# Patient Record
Sex: Male | Born: 1935 | Race: White | Hispanic: No | Marital: Married | Smoking: Former smoker
Health system: Southern US, Community
[De-identification: ages and names within clinical notes are randomized; demographics above are authoritative.]

## PROBLEM LIST (undated history)

## (undated) DIAGNOSIS — L0390 Cellulitis, unspecified: Secondary | ICD-10-CM

## (undated) DIAGNOSIS — H9190 Unspecified hearing loss, unspecified ear: Secondary | ICD-10-CM

## (undated) DIAGNOSIS — G4730 Sleep apnea, unspecified: Secondary | ICD-10-CM

## (undated) DIAGNOSIS — I639 Cerebral infarction, unspecified: Secondary | ICD-10-CM

## (undated) DIAGNOSIS — I890 Lymphedema, not elsewhere classified: Secondary | ICD-10-CM

## (undated) DIAGNOSIS — I10 Essential (primary) hypertension: Secondary | ICD-10-CM

## (undated) HISTORY — PX: BLADDER SURGERY: SHX569

## (undated) HISTORY — DX: Cerebral infarction, unspecified: I63.9

## (undated) HISTORY — PX: KNEE SURGERY: SHX244

## (undated) HISTORY — DX: Cellulitis, unspecified: L03.90

## (undated) HISTORY — DX: Sleep apnea, unspecified: G47.30

## (undated) HISTORY — DX: Unspecified hearing loss, unspecified ear: H91.90

## (undated) HISTORY — DX: Essential (primary) hypertension: I10

---

## 2006-12-05 ENCOUNTER — Ambulatory Visit: Payer: Self-pay | Admitting: Otolaryngology

## 2006-12-05 ENCOUNTER — Other Ambulatory Visit: Payer: Self-pay

## 2006-12-15 ENCOUNTER — Ambulatory Visit: Payer: Self-pay | Admitting: Otolaryngology

## 2007-01-03 ENCOUNTER — Ambulatory Visit: Payer: Self-pay | Admitting: Unknown Physician Specialty

## 2009-02-03 ENCOUNTER — Ambulatory Visit: Payer: Self-pay | Admitting: Urology

## 2009-02-10 ENCOUNTER — Ambulatory Visit: Payer: Self-pay | Admitting: Urology

## 2009-02-19 ENCOUNTER — Ambulatory Visit: Payer: Self-pay | Admitting: Urology

## 2009-03-08 ENCOUNTER — Inpatient Hospital Stay: Payer: Self-pay | Admitting: Internal Medicine

## 2010-10-02 IMAGING — CT CT ABDOMEN AND PELVIS WITHOUT AND WITH CONTRAST
2 of 4 series · 14 of 32 positions shown, 19 images · non-contrast
Comparison: none

REASON FOR EXAM: hematuria
COMMENTS:

[Series 2: wo · axial · 0.84mm/px · z∈[+132,+482]mm · 8 of 90 slices shown, 13 images]
[im 10/90  soft-tissue]
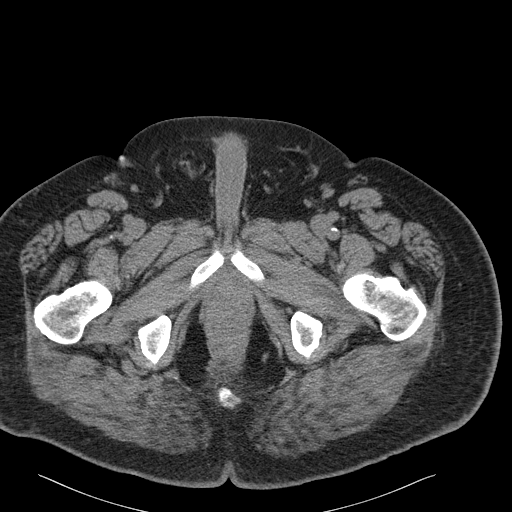
[im 10/90  bone]
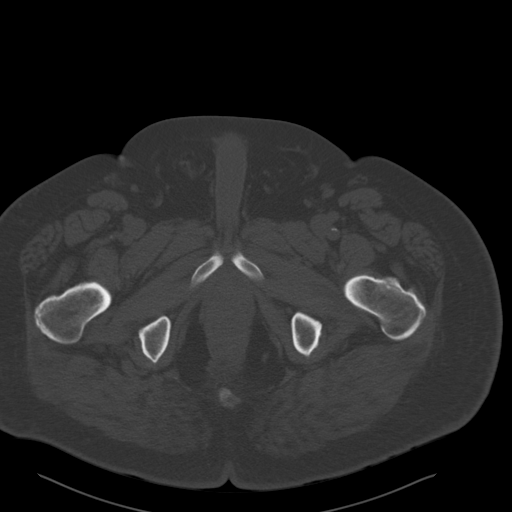
[im 20/90  soft-tissue]
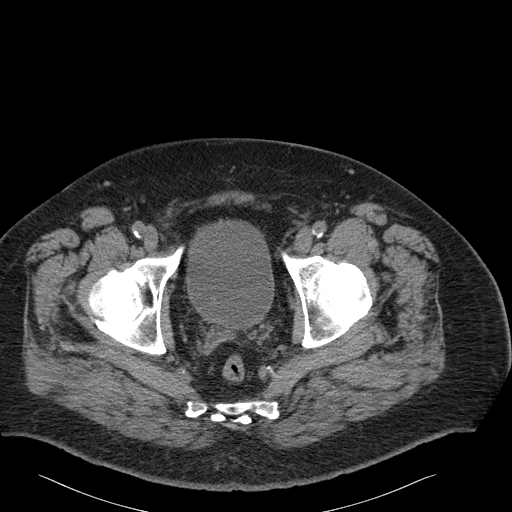
[im 30/90  soft-tissue]
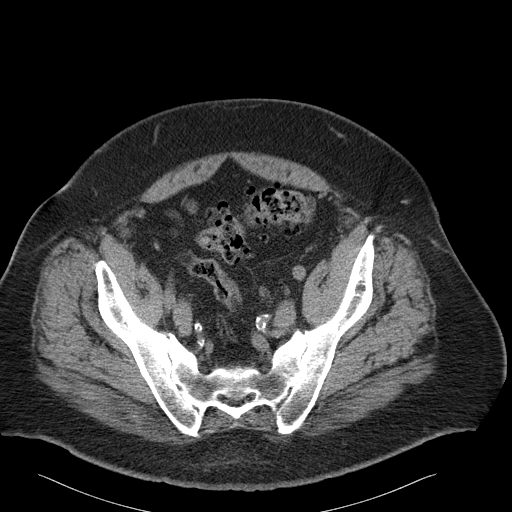
[im 40/90  soft-tissue]
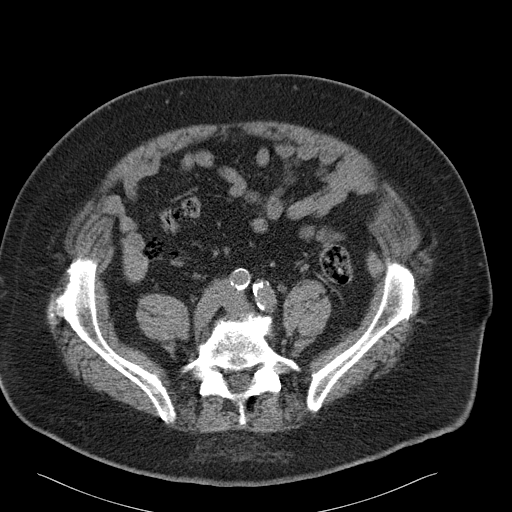
[im 50/90  soft-tissue]
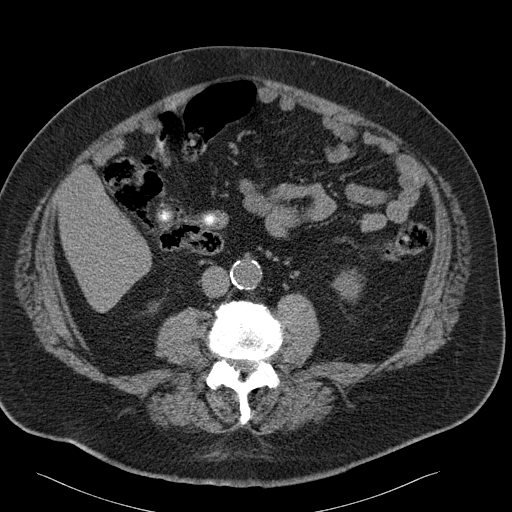
[im 50/90  lung]
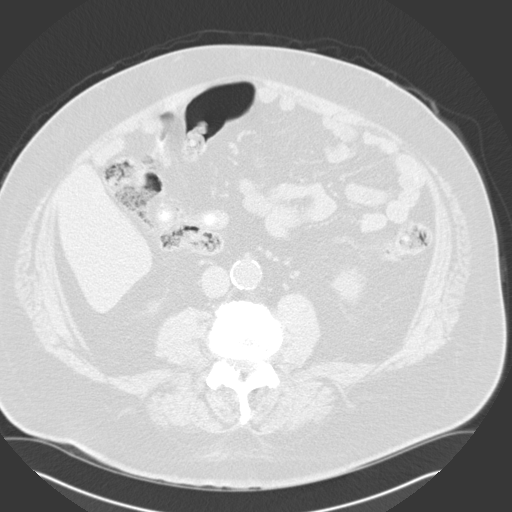
[im 60/90  soft-tissue]
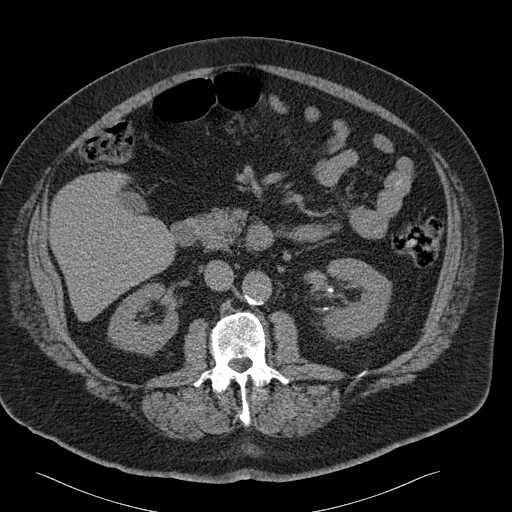
[im 60/90  lung]
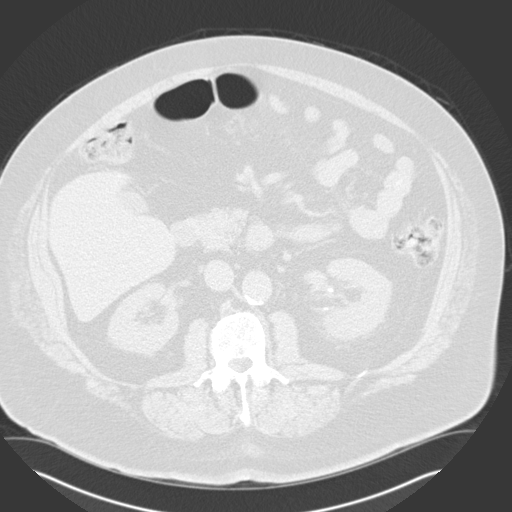
[im 70/90  soft-tissue]
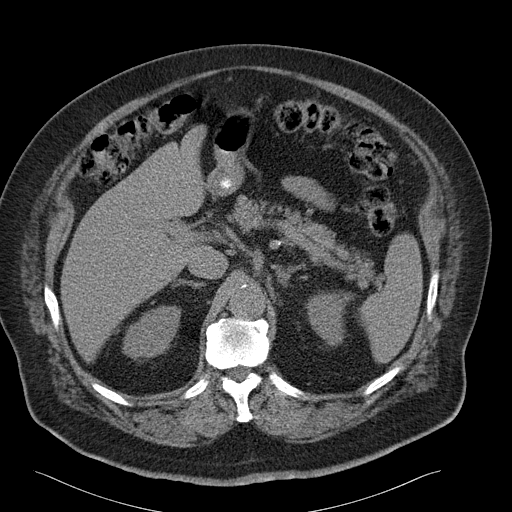
[im 70/90  lung]
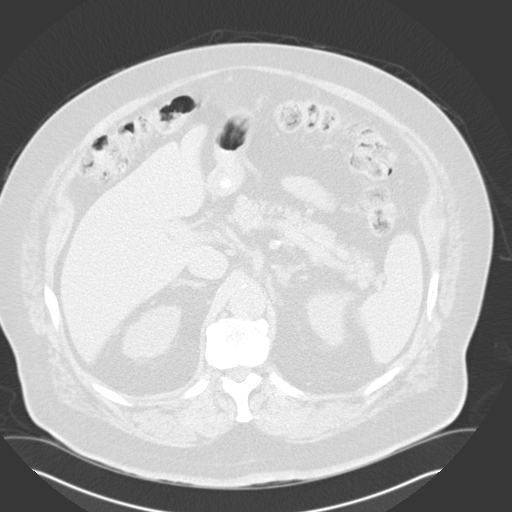
[im 80/90  soft-tissue]
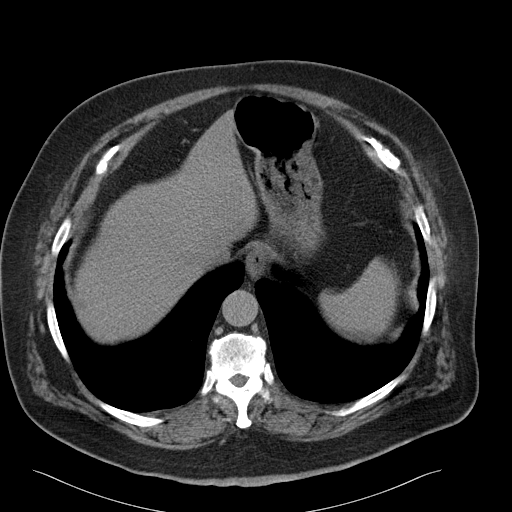
[im 80/90  lung]
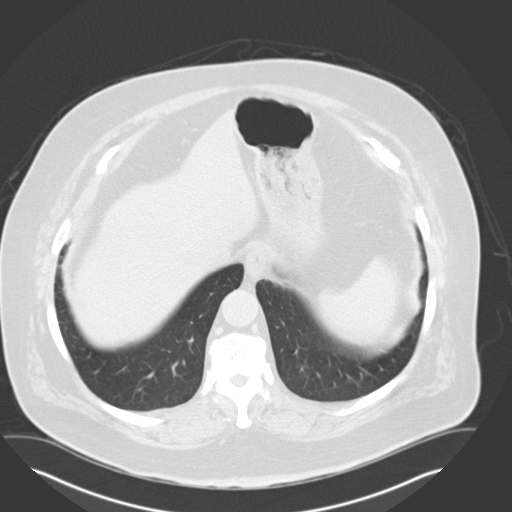

[Series 3: with · axial · 0.84mm/px · z∈[+132,+382]mm · 6 of 90 slices shown]
[im 10/90  soft-tissue]
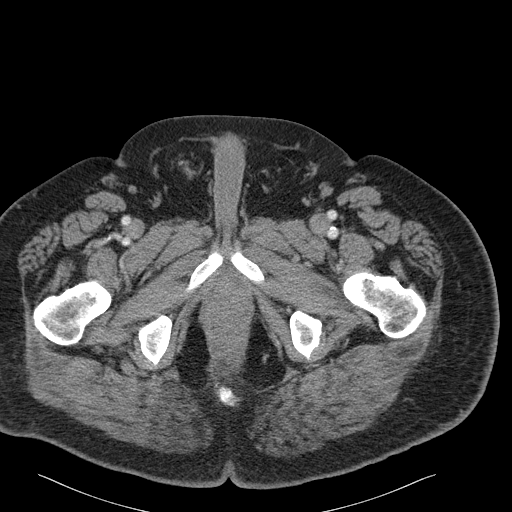
[im 20/90  soft-tissue]
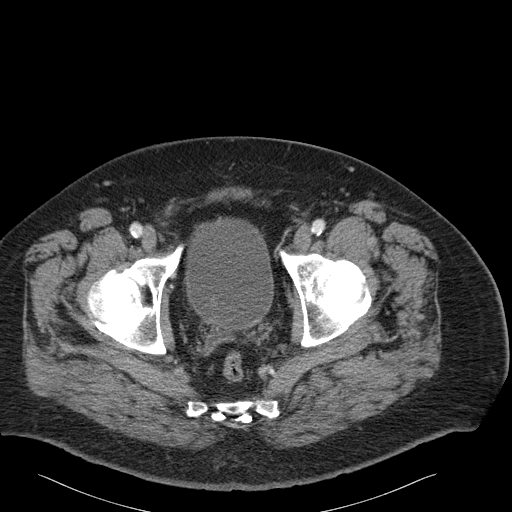
[im 30/90  soft-tissue]
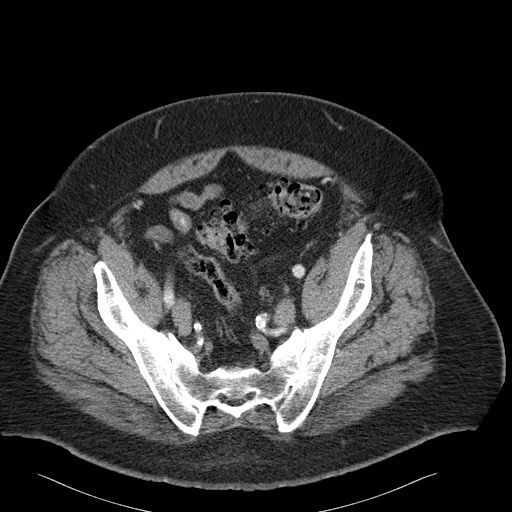
[im 40/90  soft-tissue]
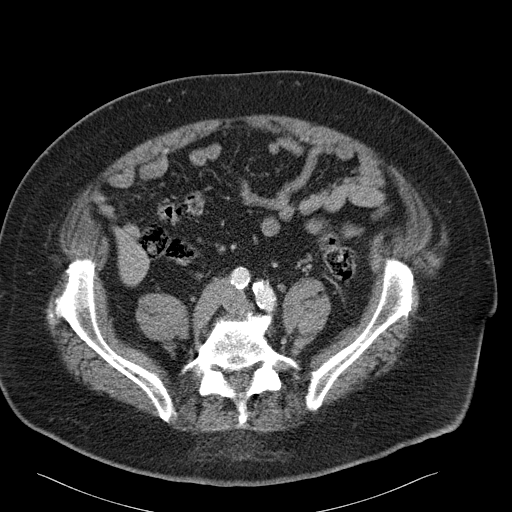
[im 50/90  soft-tissue]
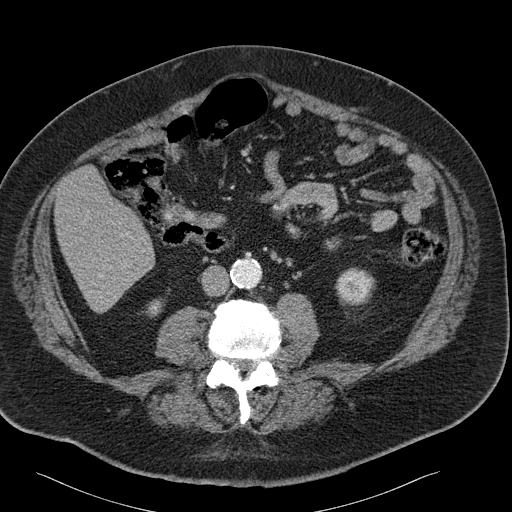
[im 60/90  soft-tissue]
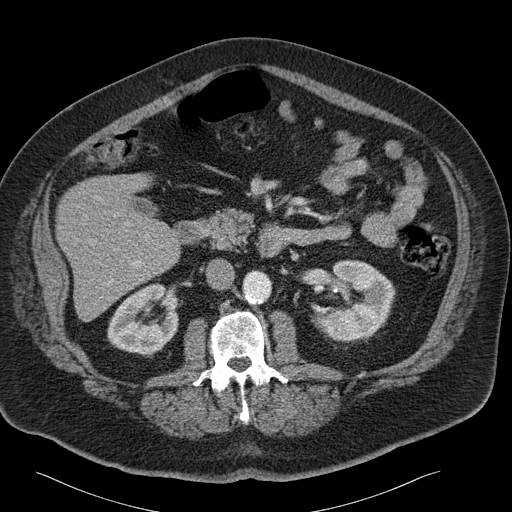

[14 of 32 positions shown; findings below may reference images not displayed]

PROCEDURE:     CT  - CT ABDOMEN / PELVIS  W/WO  - February 03, 2009  [DATE]

RESULT:     CT of the abdomen and pelvis is performed utilizing 100 ml of
Nsovue-LDC iodinated intravenous contrast. Images are reconstructed in the
axial plane at 5 mm slice thickness. A triphasic technique is utilized. The
precontrast images demonstrate no definite urinary obstruction. There
appears to be atherosclerotic calcification. A small stone in the lower pole
of the left kidney appears to be present on image #35 although
atherosclerotic calcification could give a similar appearance.  Neither
kidney shows obstruction. Following contrast administration there is
enhancement of the renal parenchyma bilaterally. The left kidney appears to
show a slightly thicker renal parenchymal pattern than the right.
Atherosclerotic changes are noted in the aorta and its branches. The liver,
spleen, pancreas, adrenal glands and visualized bowel appear unremarkable.
The urinary bladder fills with contrast opacified urine. There is no
significant aneurysm in the aorta. There is no free fluid or free air. There
is no abnormal fluid collection. Scattered colonic diverticula are seen. The
prostate appears to be slightly enlarged and unremarkable. The urinary
bladder is deformed along the base by the prostate. The gallbladder is
nondistended.
IMPRESSION: 1.     No definite renal mass. There is atherosclerotic calcification. There
may be a small stone in the lower pole to midpole region of the left kidney.
The prostate is mildly enlarged. There is scattered colonic diverticulosis.
There is minimal atelectasis or fibrosis at the lung bases.

## 2010-11-04 IMAGING — US US EXTREM LOW VENOUS BILAT
1 series · 17 of 24 positions shown · non-contrast
Comparison: none

REASON FOR EXAM: bilateral leg swelling, cancer, right>left
COMMENTS:

[Series 1: us extrem low venous bilat · 17 of 45 slices shown]
[im 1/45]
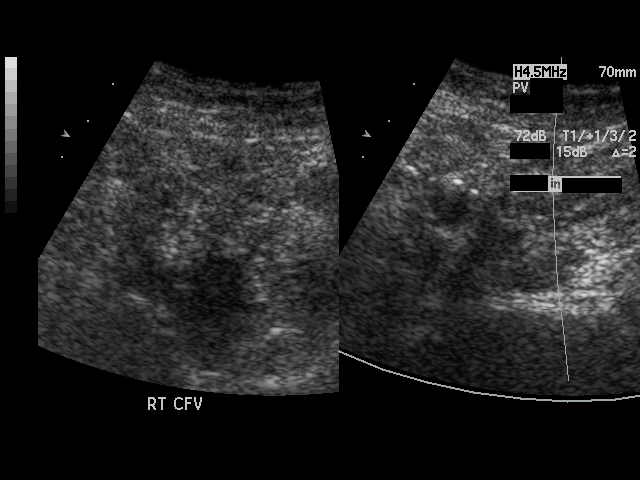
[im 4/45]
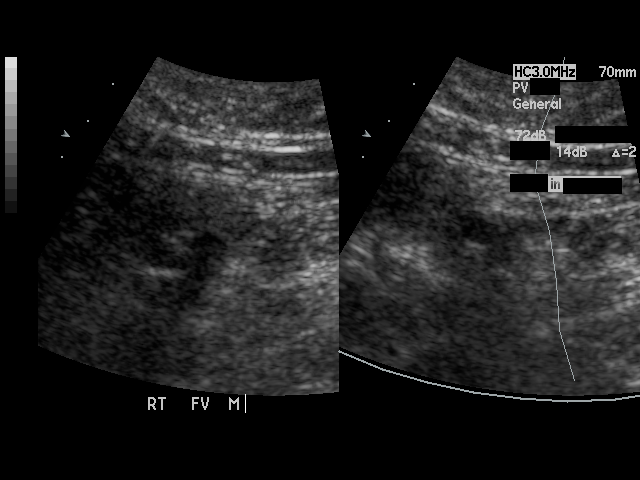
[im 6/45]
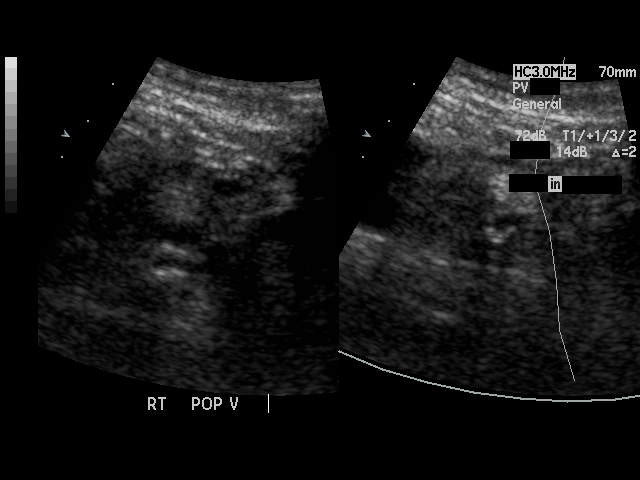
[im 8/45]
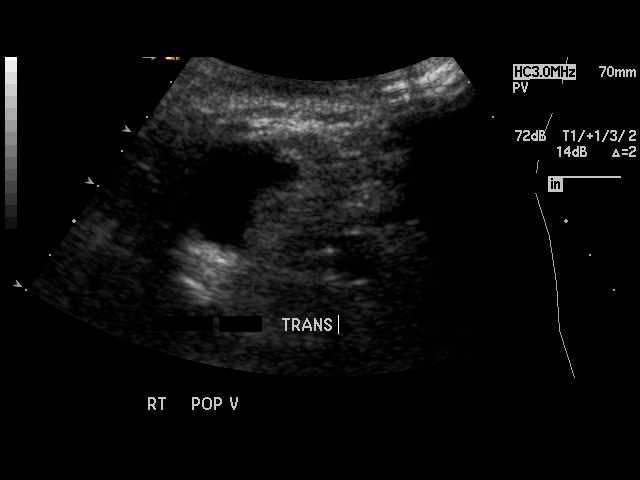
[im 12/45]
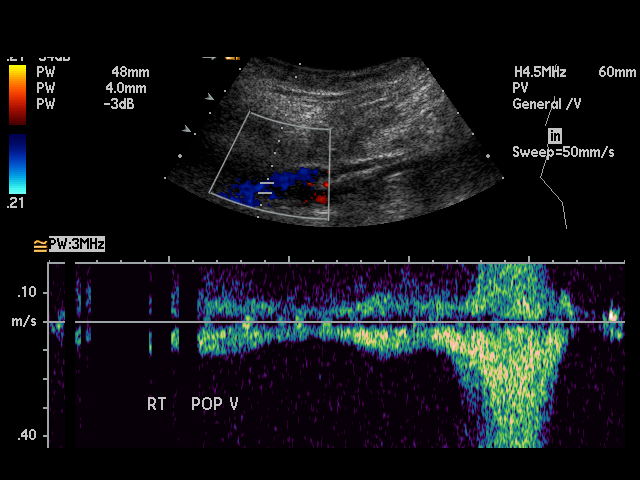
[im 14/45]
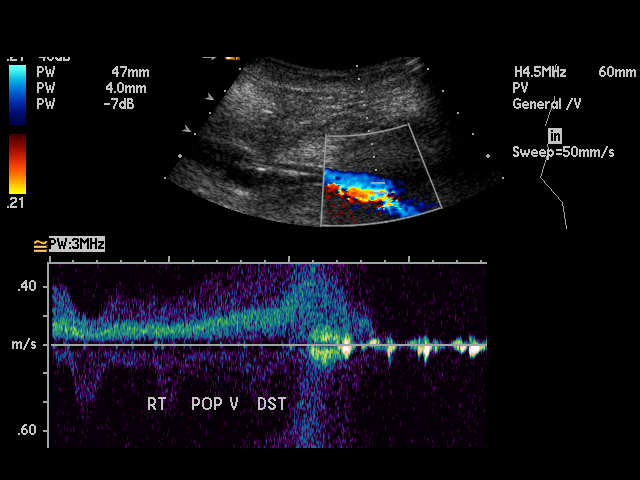
[im 18/45]
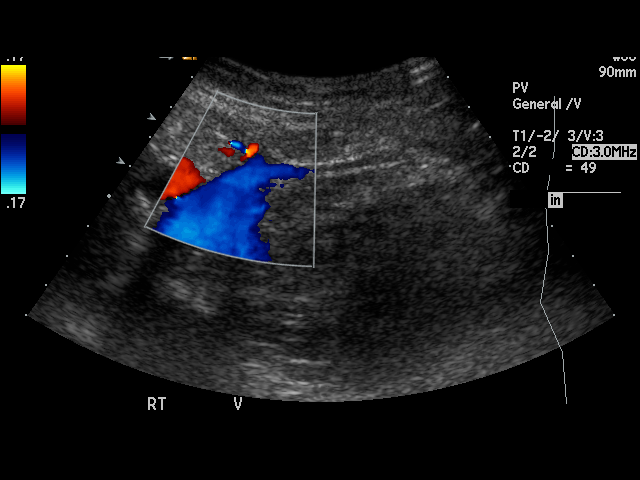
[im 20/45]
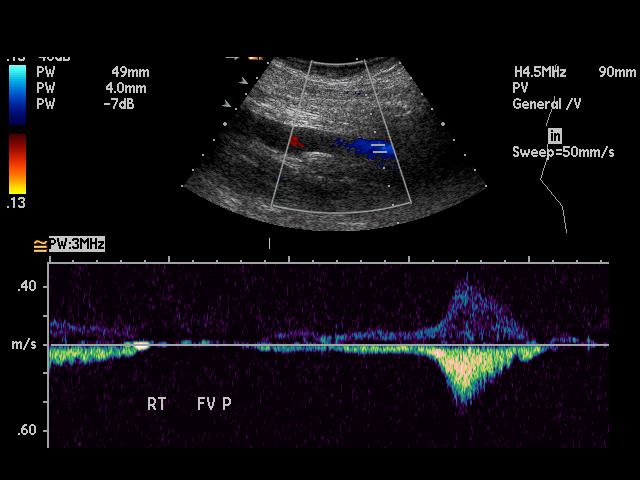
[im 23/45]
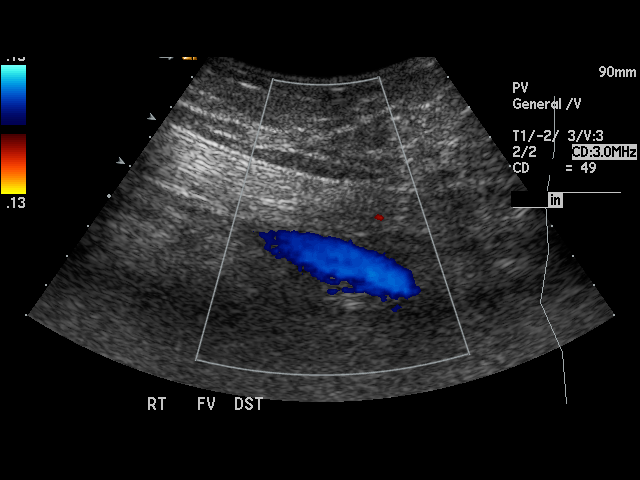
[im 25/45]
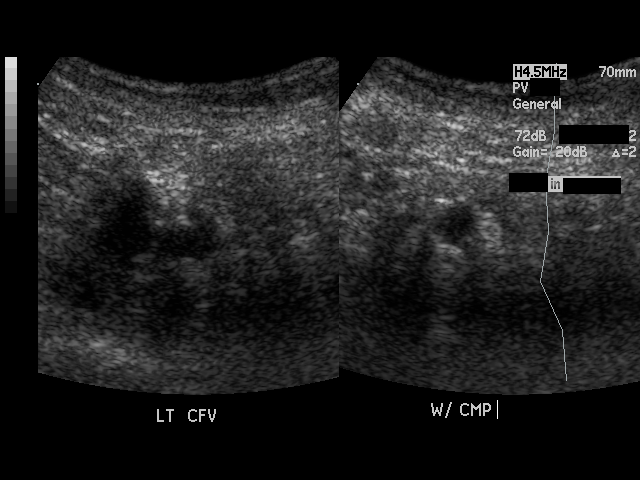
[im 27/45]
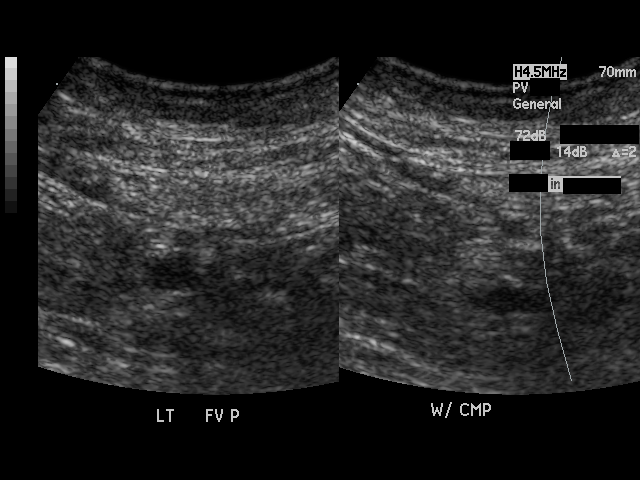
[im 31/45]
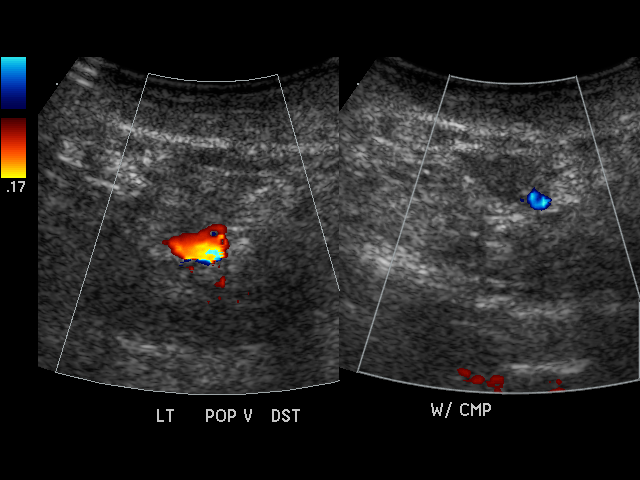
[im 33/45]
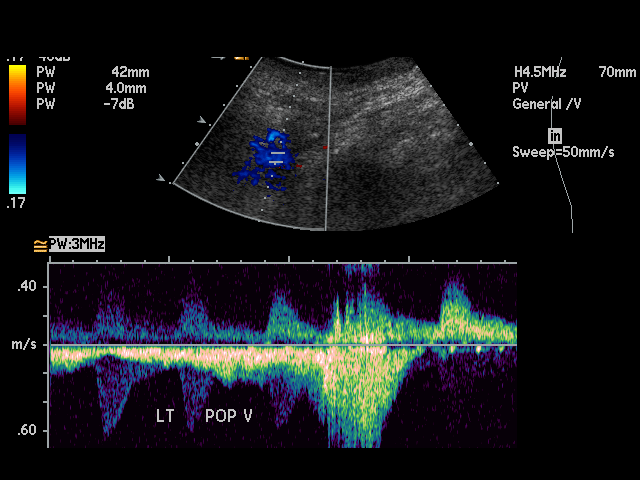
[im 37/45]
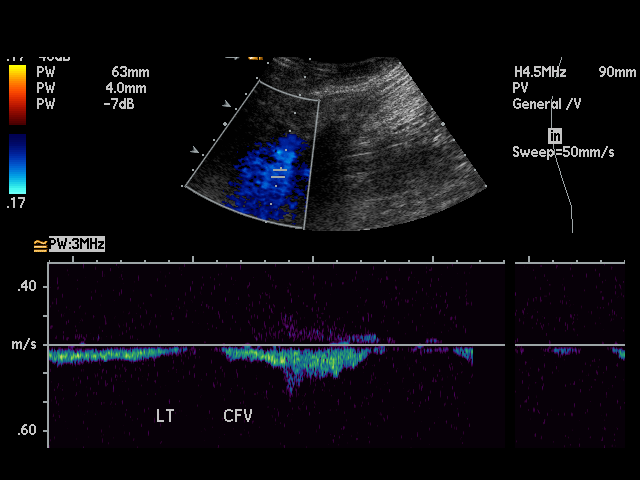
[im 39/45]
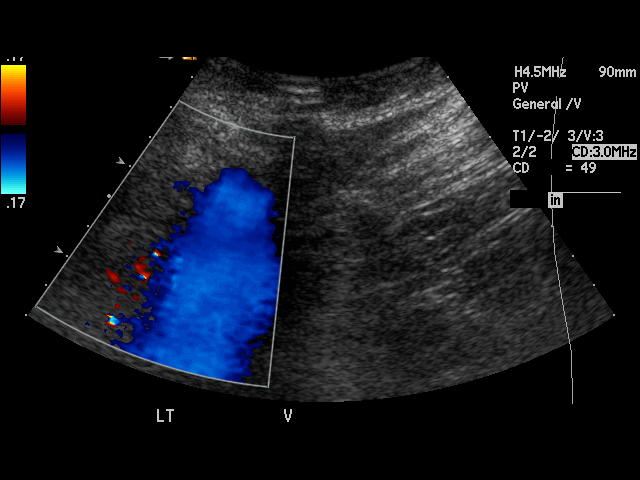
[im 41/45]
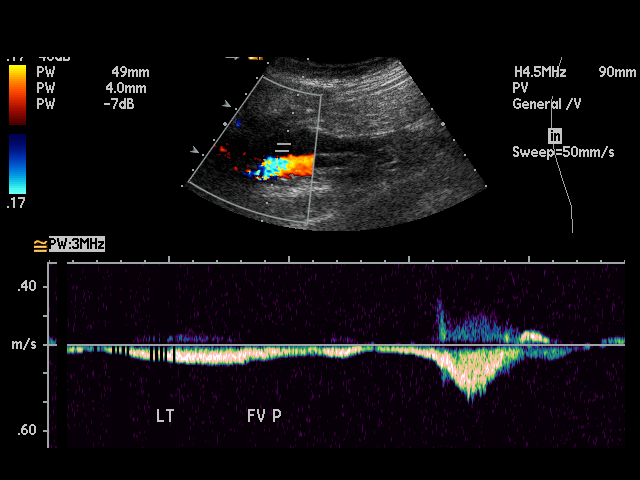
[im 45/45]
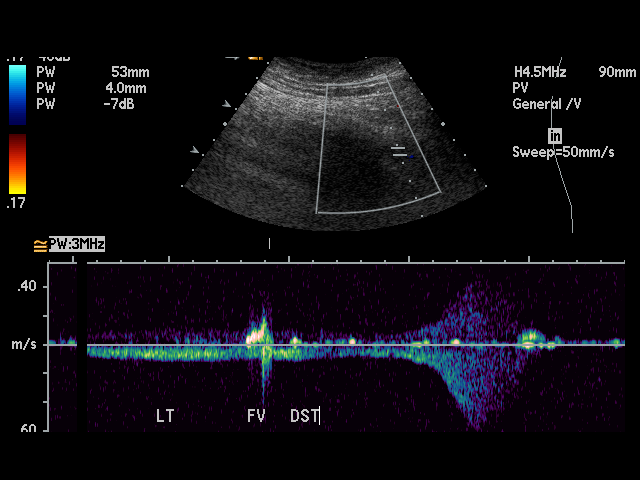

[17 of 24 positions shown; findings below may reference images not displayed]

PROCEDURE:     US  - US DOPPLER LOW EXTR BILATERAL  - March 08, 2009  [DATE]

RESULT:     Duplex Doppler interrogation of the deep venous system of both
legs from the inguinal to the popliteal region demonstrates the deep venous
systems are fully compressible throughout. The color Doppler and spectral
Doppler appearance is normal. There is normal response to distal
augmentation. The color Doppler images show no filling defect. There is a
fluid collection in the popliteal fossa of the right leg measuring 4.4 x
x 2.5 cm consistent with a Baker's cyst.
IMPRESSION: 1. No evidence of DVT in either lower extremity.
2. Right popliteal fossa cyst.

## 2011-01-18 ENCOUNTER — Ambulatory Visit: Payer: Self-pay | Admitting: General Practice

## 2011-02-01 ENCOUNTER — Inpatient Hospital Stay: Payer: Self-pay | Admitting: General Practice

## 2011-02-04 ENCOUNTER — Encounter: Payer: Self-pay | Admitting: Internal Medicine

## 2011-03-04 ENCOUNTER — Encounter: Payer: Self-pay | Admitting: Internal Medicine

## 2011-04-03 ENCOUNTER — Encounter: Payer: Self-pay | Admitting: Internal Medicine

## 2012-09-29 IMAGING — CR DG KNEE 1-2V*R*
1 series · 2 of 2 positions shown · non-contrast
Comparison: none

REASON FOR EXAM: postop
COMMENTS:   Bedside (portable):Y

[Series 1: view not recorded · 0.17mm/px · 2 of 2 slices shown]
[im 1/2]
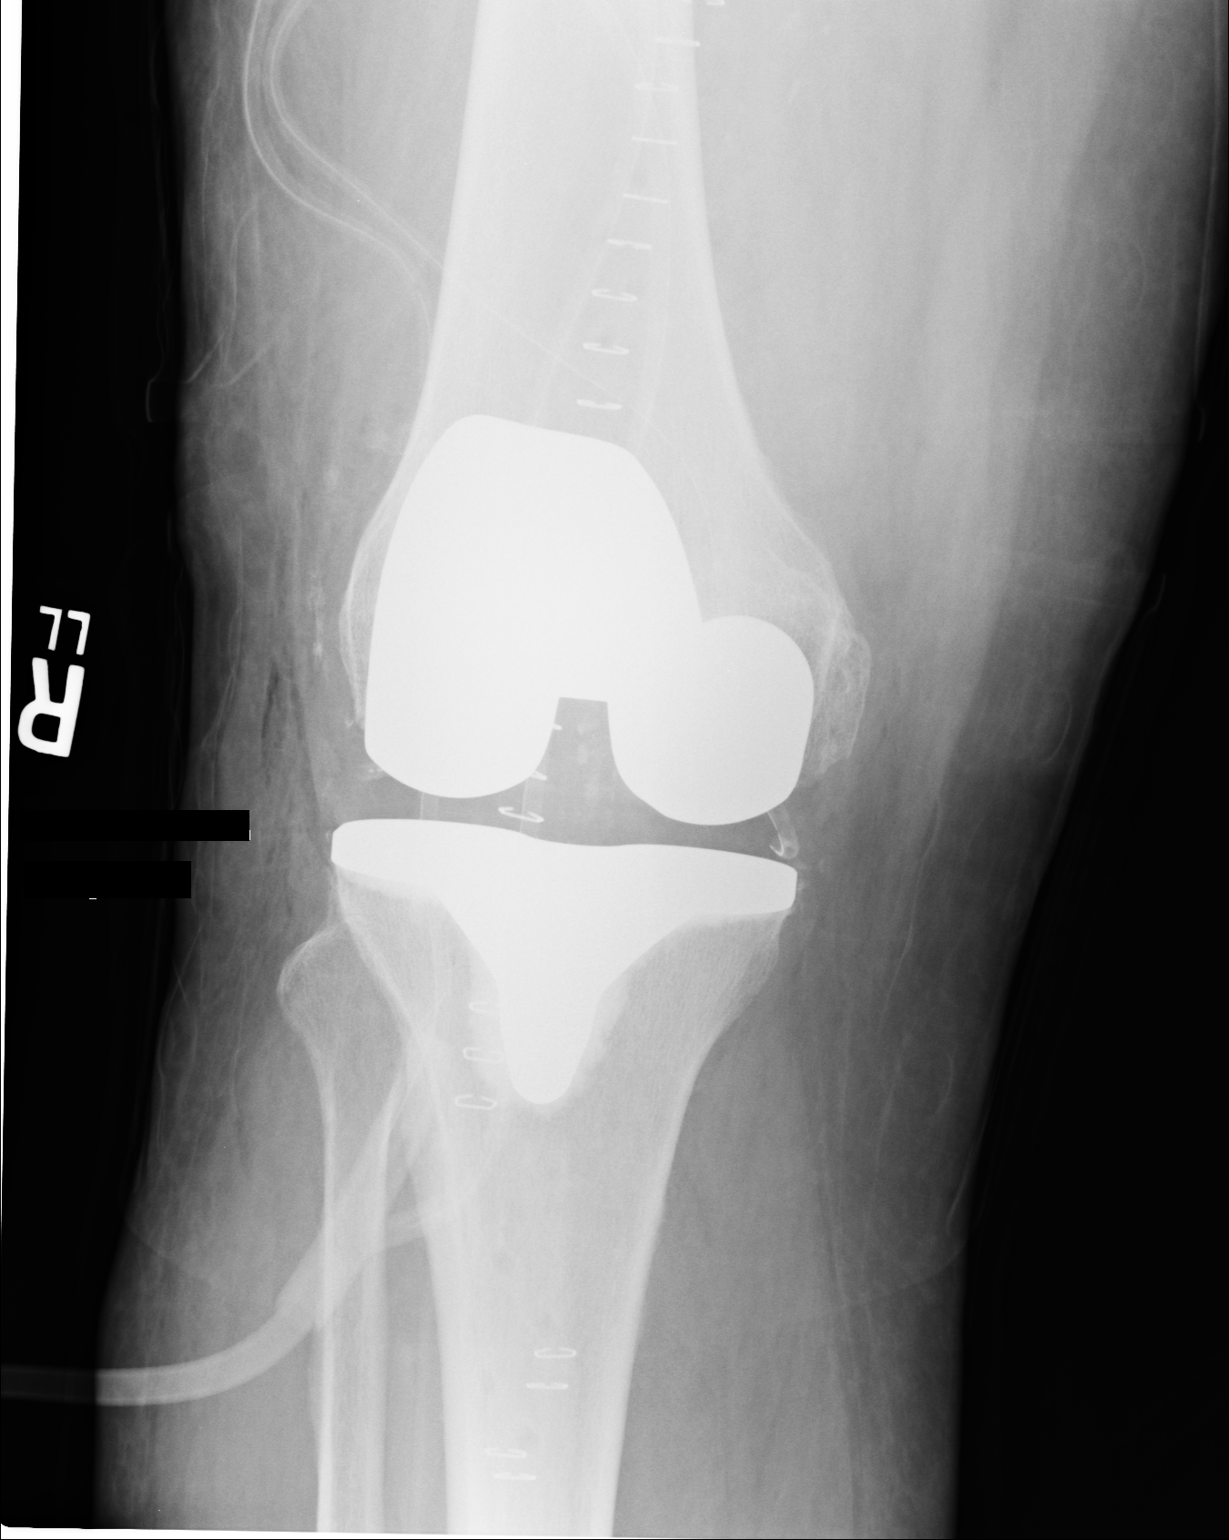
[im 2/2]
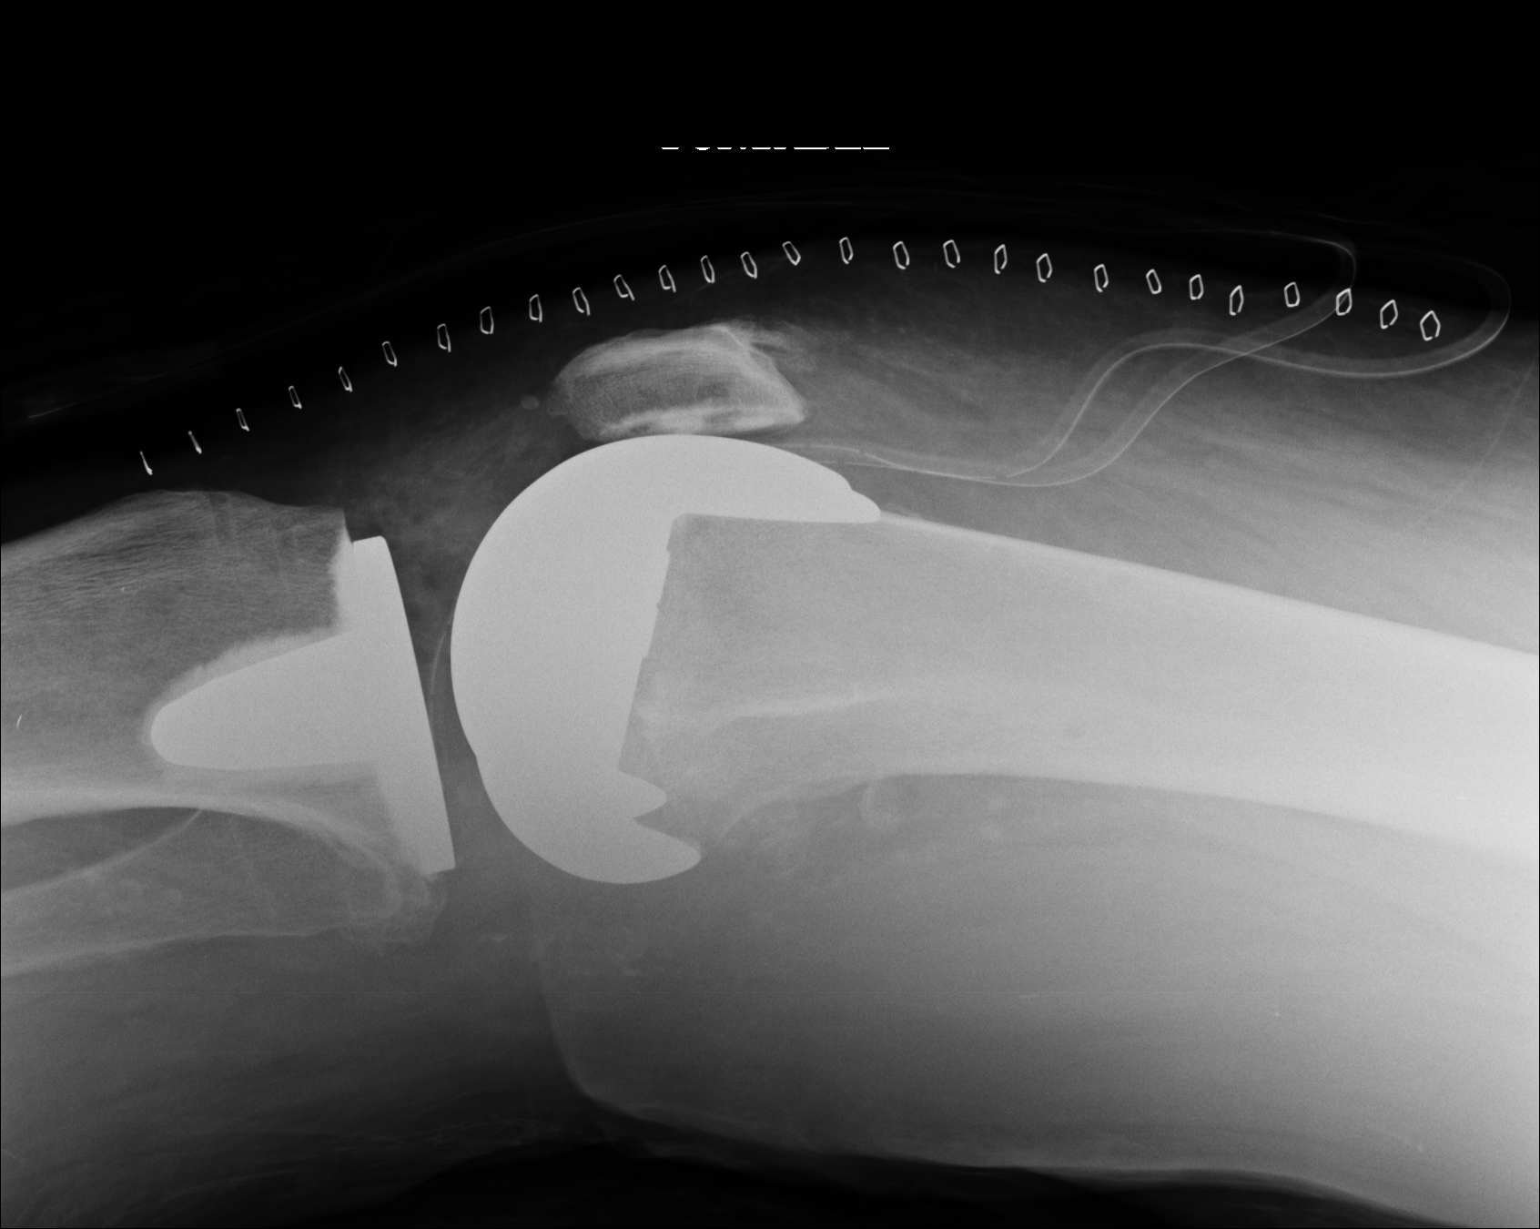

[2 of 2 positions shown; findings below may reference images not displayed]

PROCEDURE:     DXR - DXR KNEE RIGHT AP AND LATERAL  - February 01, 2011  [DATE]

RESULT:     The patient is status post right knee replacement. There is
noted a mildly mottled appearance to the medial margin of the medial femoral
condyle. The findings appear to be chronic. No fracture is seen. There is no
dislocation at the prosthetic knee joint. Postsurgical drains are visualized.
IMPRESSION: 1.     The patient is status post right knee replacement. No abnormal
postoperative changes are identified.

## 2013-02-07 ENCOUNTER — Ambulatory Visit: Payer: Self-pay | Admitting: Podiatry

## 2013-02-20 DIAGNOSIS — Z8551 Personal history of malignant neoplasm of bladder: Secondary | ICD-10-CM | POA: Insufficient documentation

## 2013-02-20 DIAGNOSIS — R350 Frequency of micturition: Secondary | ICD-10-CM | POA: Insufficient documentation

## 2013-02-20 DIAGNOSIS — R3129 Other microscopic hematuria: Secondary | ICD-10-CM | POA: Insufficient documentation

## 2013-02-20 DIAGNOSIS — C679 Malignant neoplasm of bladder, unspecified: Secondary | ICD-10-CM | POA: Insufficient documentation

## 2013-02-28 ENCOUNTER — Encounter: Payer: Self-pay | Admitting: Podiatry

## 2013-02-28 ENCOUNTER — Ambulatory Visit (INDEPENDENT_AMBULATORY_CARE_PROVIDER_SITE_OTHER): Payer: Medicare Other | Admitting: Podiatry

## 2013-02-28 VITALS — BP 115/62 | HR 102 | Resp 20 | Ht 71.0 in | Wt 296.0 lb

## 2013-02-28 DIAGNOSIS — M79609 Pain in unspecified limb: Secondary | ICD-10-CM

## 2013-02-28 DIAGNOSIS — B351 Tinea unguium: Secondary | ICD-10-CM

## 2013-02-28 NOTE — Progress Notes (Signed)
Steven Meza presents today for chief complaint of painful toenails bilateral foot.  Objective: Pulses remain palpable bilateral lower extremity. Nails are thick yellow dystrophic clinically mycotic. He does have some verrucoid changes to the dorsal aspect of the left foot. But with his associated edema I would hesitate to treat these.  Assessment: Pain in limb secondary to onychomycosis bilateral foot.  Plan: Debridement of nails 1 through 5 bilateral covered service secondary to pain. Followup with him in 3 months.

## 2013-05-30 ENCOUNTER — Ambulatory Visit: Payer: Medicare Other | Admitting: Podiatry

## 2018-02-13 ENCOUNTER — Ambulatory Visit: Payer: Medicare Other | Attending: Internal Medicine | Admitting: Occupational Therapy

## 2018-02-13 ENCOUNTER — Other Ambulatory Visit: Payer: Self-pay

## 2018-02-13 ENCOUNTER — Encounter: Payer: Self-pay | Admitting: Occupational Therapy

## 2018-02-13 DIAGNOSIS — I890 Lymphedema, not elsewhere classified: Secondary | ICD-10-CM | POA: Diagnosis present

## 2018-02-13 NOTE — Patient Instructions (Signed)

## 2018-02-14 ENCOUNTER — Ambulatory Visit: Payer: Medicare Other | Admitting: Occupational Therapy

## 2018-02-14 DIAGNOSIS — I890 Lymphedema, not elsewhere classified: Secondary | ICD-10-CM

## 2018-02-14 NOTE — Therapy (Signed)
[place] [facility] SERVICES [address] [place], [place], [address] Phone: [phone]   Fax:  [phone]  Occupational Therapy Treatment  Patient Details  Name: Steven Meza. MRN: [id number] Date of Birth: [birth year] Referring Provider (OT): Felipa Furnace, MD   Encounter Date: 02/14/2018  OT End of Session - 02/14/18 1639    Visit Number  2    Number of Visits  36    Date for OT Re-Evaluation  05/14/18    OT Start Time  0300    OT Stop Time  0418    OT Time Calculation (min)  78 min    Activity Tolerance  Patient tolerated treatment well;No increased pain    Behavior During Therapy  WFL for tasks assessed/performed       Past Medical History:  Diagnosis Date  . Cellulitis   . HBP (high blood pressure)   . Hearing loss   . Sleep apnea   . Stroke [facility])     Past Surgical History:  Procedure Laterality Date  . BLADDER SURGERY    . KNEE SURGERY Right     There were no vitals filed for this visit.  Subjective Assessment - 02/14/18 1632    Subjective   Steven Meza arrives for OT rx visit 2 to address BLE lymphedema. Pt is accompanied by his wife, [name]. Pt reports that he read Lymphedema Workbook during visit interval. We spent 1/3 of our session today discussing questions re LE etiology, diet considerations, exercise questions and CDT.    Patient is accompained by:  Family member    Pertinent History  Conditions contributing to BLE lymphedema include morbid obesity, (BMI 42.13 kg/m2) OA, OSA, (declines CPap) HTN, venous stasis and recurrent cellulitis    Limitations  difficulty walking, impaired transfers and functional mobility, impaired sleep, HOH (declines hearing aids) , chronic leg swelling and associated pain, impaired LB dressing and bathing, decreased AROM at feet, ankles, knees 2/2 body habitus and girth, impaired standing and walking balance with 3 falls in last 3 months,      Patient Stated Goals  stop he  lymphorrhea, get the swelling down and keep it from getting worse.    Currently in Pain?  Yes   unchanged since initial eval.   Pain Location  Leg    Pain Orientation  Right;Left    Pain Descriptors / Indicators  Heaviness;Tender;Discomfort;Tightness;Sore;Pressure;Tiring;Stabbing;Burning    Pain Type  Chronic pain    Pain Onset  --   > 2 years                  OT Treatments/Exercises (OP) - 02/14/18 0001      ADLs   ADL Education Given  Yes      Manual Therapy   Manual Therapy  Edema management;Compression Bandaging    Compression Bandaging  4 layer gradietmn compression wrtap to LLE below the knee. Applied 0.4 cm Rosidal foam over coton [place] w/ equal overlaps. Aplied 8 cm short stretch wrap to foot and ankle using gradient technique. Omitted toe wraps today. Applied 10 and 12 cm ss wraps circumferentially from ankle to politeal ending with single layer SS at top edge. Stretch net applied over tape.              OT Education - 02/14/18 1638    Education Details  Pt and CG edu provided throughout session for gradient compression wrapping, CDT regimes, and lymphatic pumping ther ex.    Person(s) Educated  Patient;Spouse    Methods  Explanation;Demonstration;Handout;Tactile cues;Verbal cues    Comprehension  Verbalized understanding;Returned demonstration;Tactile cues required;Need further instruction;Verbal cues required          OT Long Term Goals - 02/14/18 1355      OT LONG TERM GOAL #1   Title  Pt will demonstrate understanding of lymphedema (LE) precautions / prevention principals, including signs / symptoms of cellulitis infection with modified independence using LE Workbook as printed reference to identify 6 precautions without verbal cues by end of 6th  OT Rx visit.     Baseline  Max A    Time  6    Period  Weeks    Status  New      OT LONG TERM GOAL #2   Title  Pt will be able to apply multi-layer short stretch compression wraps using correct  gradient techniques with Max CG assistance to achieve optimal limb volume reduction during Intensive Phase CDT, and to return affected limb(s) , as closely as possible, to premorbid size and shape.    Baseline  Dependent    Time  6    Status  New      OT LONG TERM GOAL #3   Title  Pt to achieve no less than 10% limb volume reduction in affected limb(s) during Intensive Phase CDT to control limb swelling, to improve tissue integrity and immune function, to improve ADLs performance and to improve functional mobility/ transfers.    Baseline  Max A    Time  12    Period  Weeks    Status  New      OT LONG TERM GOAL #4   Title  With moderate  CG assistance Pt will achieve 100% compliance with daily LE self-care home program components, including proper skin care, simple self-MLD, gradient compression wraps/ garments, and therapeutic exercise to ensure optimal Intensive Phase limb volume reduction to expedite compression garment/ device fitting.    Baseline  Max A    Time  12    Period  Weeks    Status  New      OT LONG TERM GOAL #5   Title  Pt will demonstrate competent use of assistive devices during LE self-care training to improve ability to lift legs onto treatment table, to don and doff shoes and socks, and to don and doff compression garments/devices with Max  assistance to ensure optimal LE self-management over time.    Baseline  Dependent    Time  12    Period  Weeks    Status  New      OT LONG TERM GOAL #6   Title  Pt will retain optimal limb volume reductions achieved during Intensive Phase CDT with no more than 3% volume increase with ongoing CG assistance to limit LE progression and further functional decline.    Baseline  Dependent    Time  6    Period  Months    Status  New            Plan - 02/14/18 1642    Clinical Impression Statement  Commenced CDT to     Occupational Profile and client history currently impacting functional performance  Commenced CDT LLE today.  Postponed volumetric measurements due to lymphorrhea. Provided Pt and family edu throughout session for LE etiology and self-care.  Applied knee length gradient compression wraps to LLE. Cont as per POC.    Occupational performance deficits (Please refer to evaluation for details):  ADL's;IADL's;Leisure;Rest  and Sleep;Social Participation;Other   productive activities, ambulation and functional mobility   Rehab Potential  Fair    OT Frequency  3x / week    OT Duration  12 weeks    OT Treatment/Interventions  Self-care/ADL training;Therapeutic exercise;DME and/or AE instruction;Therapist, nutritional;Compression bandaging;Other (comment);Manual Therapy;Patient/family education;Therapeutic activities;Manual lymph drainage;Energy conservation   skin care w/ low ph lotion and castor oil ([place])   Clinical Decision Making  Multiple treatment options, significant modification of task necessary    OT Home Exercise Plan  fit with custom, BLE compression garments that are caregiver friendly for donning and doffing.    Consulted and Agree with Plan of Care  Patient;Family member/caregiver    Family Member Consulted  spouse - [name]       Patient will benefit from skilled therapeutic intervention in order to improve the following deficits and impairments:  Abnormal gait, Decreased balance, Decreased endurance, Decreased mobility, Decreased skin integrity, Difficulty walking, Impaired sensation, Obesity, Pain, Impaired perceived functional ability, Decreased knowledge of precautions, Decreased range of motion, Increased edema, Decreased knowledge of use of DME, Impaired flexibility  Visit Diagnosis: Lymphedema, not elsewhere classified    Problem List There are no active problems to display for this patient.   Andrey Spearman, MS, OTR/L, [facility] 02/14/18 4:44 PM   [place] [facility] SERVICES [address] [place], [place], [address] Phone:  [phone]   Fax:  [phone]  Name: Steven Meza. MRN: [id number] Date of Birth: 28-Aug-1930

## 2018-02-14 NOTE — Therapy (Signed)
[place] MAIN [facility] SERVICES [address] [place], [place], [address] Phone: [phone]   Fax:  [phone]  Occupational Therapy Evaluation : Lymphedema Care  Patient Details  Name: Steven Meza. MRN: [id number] Date of Birth: Oct 24, 1930 Referring Provider (OT): Felipa Furnace, MD   Encounter Date: 02/13/2018    Past Medical History:  Diagnosis Date  . Cellulitis   . HBP (high blood pressure)   . Hearing loss   . Sleep apnea   . Stroke [facility])     Past Surgical History:  Procedure Laterality Date  . BLADDER SURGERY    . KNEE SURGERY Right     There were no vitals filed for this visit.  Subjective Assessment - 02/13/18 1546    Subjective   Steven Meza, Gro[place]. is referred to Occupational Therapy by Denton Meek, MD, for evaluation and treatment of BLE lymphedema. Pt reports onset of leg swelling in the 1900s with steady worsening over time. Pt reports steady exacerbation of leg swelling and pain over the past 2 years with recurrent cellulitis and frequent lymphorrhea.  Pt denies known family history of chronic leg swelling. He has not undergone Complete Decongestive Therapy previously for lymphedema care, but he does report attending wound clinic for [facility] boots in the past. Pt uses knee length sequential pneumatic device daily for one hour on both legs simultaneously, but the device is not available for assessment today.  Pt does not utilize compression garments, and his wife states that he does not typically elevate his legs when seated. Pt reports he bathes his legs, applies prescription topical ointment and takes oral antibiotic daily to limit infection risk.     Patient is accompained by:  Family member    Pertinent History  Conditions contributing to BLE lymphedema include morbid obesity, (BMI 42.13 kg/m2) OA, OSA, (declines CPap) HTN, venous stasis and recurrent cellulitis    Limitations  difficulty walking, impaired  transfers and functional mobility, impaired sleep, HOH (declines hearing aids) , chronic leg swelling and associated pain, impaired LB dressing and bathing, decreased AROM at feet, ankles, knees 2/2 body habitus and girth, impaired standing and walking balance with 3 falls in last 3 months,      Patient Stated Goals  stop he lymphorrhea, get the swelling down and keep it from getting worse.    Currently in Pain?  Yes    Pain Score  --   not rated numerically despite repeated requests   Pain Location  Leg    Pain Orientation  Right;Left    Pain Descriptors / Indicators  Heaviness;Tender;Throbbing;Jabbing;Burning;Sore;Tightness;Pressure    Pain Type  Chronic pain    Pain Onset  --   > 2 years   Pain Frequency  Intermittent    Aggravating Factors   standing, walking, all transfers    Pain Relieving Factors  meds    Effect of Pain on Daily Activities  see LIMITATIONS above        OT Education - 02/14/18 1353    Education Details  Provided Pt and family skilled education regarding lymphatic structure and function, lymphedema etiologies, onset patterns and stages of progression. Discussed  impact of obesity on lymphatic system function. Outlined Complete Decongestive Therapy (CDT)  as standard of LE care and provided in depth information regarding 4 primary components of both Intensive and Self Management Phases, including Manual Lymph Drainage (MLD), compression wrapping and garments, skin care, and therapeutic exercise.   Discussed   Importance of  daily, ongoing LE self-care essential to retaining clinical gains and limiting progression.  Lastly, reviewed lymphedema precautions, including cellulitis risk and difficulty with wound healing. Provided printed Lymphedema Workbook for reference.    Person(s) Educated  Patient;Spouse    Methods  Explanation;Demonstration;Handout    Comprehension  Verbalized understanding;Returned demonstration;Tactile cues required;Need further instruction         Plan - 02/14/18 1355    Clinical Impression Statement  Steven Meza is an 84 year-old male presenting with moderate stage III LLE lymphedema (LE) and moderate, stage II, RLE LE secondary to morbid obesity, suspected venous insufficiency, chronic inflammation 2/2 recurrent infection and lymphatic overload due to long standing systemic fluid retention. Dependent positioning and sedentary lifestyle are also significant contributing factors. LE is a chronic, progressive, incurable condition characterized by chronic soft tissue swelling and skin changes associated with dense, protein -rich fibrosis. Lymphedema and associated leg pain limits Steven Meza ability to perform functional activities in all occupational domains, including basic and instrumental ADLs, productive activities and leisure pursuits, social participation and role performance at home and in the community. Severe LE limits active and passive range of motion, functional mobility and transfers, and safe ambulation. Steven Meza will benefit from Occupational Therapy for Intensive and Management Phase Complete Decongestive Therapy (CDT) to include manual lymphatic drainage, skin care, therapeutic exercise and compression therapy. Emphasis throughout OT course will also focus on Pt and family education for long term LE self-care. Pt will require varying levels of assistance w/ LE self-care home program during Intensive CDT and ongoing for optimal clinical and functional outcome. Without skilled OT and daily caregiver support LE for self -management LE will progress and further functional decline is expected.    Occupational Profile and client history currently impacting functional performance  limited mobility, sedentary lifestyle with dependent positioning, sdeclines CPAP, caregiver dependent for many basic  and some instrumental ADLs    Occupational performance deficits (Please refer to evaluation for details):  ADL's;IADL's;Leisure;Rest and  Sleep;Social Participation;Other   productive activities, ambulation and functional mobility   Rehab Potential  Fair    OT Frequency  3x / week    OT Duration  12 weeks    OT Treatment/Interventions  Self-care/ADL training;Therapeutic exercise;DME and/or AE instruction;Therapist, nutritional;Compression bandaging;Other (comment);Manual Therapy;Patient/family education;Therapeutic activities;Manual lymph drainage;Energy conservation   skin care w/ low ph lotion and castor oil (palma christi)   Clinical Decision Making  Multiple treatment options, significant modification of task necessary    OT Home Exercise Plan  fit with custom, BLE compression garments that are caregiver friendly for donning and doffing.    Consulted and Agree with Plan of Care  Patient;Family member/caregiver    Family Member Consulted  spouse - Steven Meza           LLE: Moderate Stage III (elephantiasis) ; RLE: moderate Stage II secondary to morbid obesity and CVI Skin Description Hyper-Keratosis Peau' de Orange Shiny Tight Fibrotic Fatty Doughy Indurated   severe severe X x severe     severe   Hydration Dry Flaky Erythema Other   x x x    Color Redness Present Pallor Blanching Hemosiderin Staining Other   x       Odor Malodorous Yeast  Absent      x   Temperature Warm Cool wnl   x     Pitting Edema   1+ 2+ 3+ 4+ Non-pitting         x   Girth Symmetrical Asymmetrical Other Distribution  L>R Below knees   Stemmer Sign Positive Negative     STRONG +     Lymphorrea History Of:  Present Absent   x x     Wounds History Of Present Absent Venous Arterial Pressure Size    x  x          Signs of Infection Redness Warmth Erythema Acute Swelling Drainage Borders   x                Scars Adhesions Hypersensitivity        Sensation Light Touch Deep pressure Hypersensitivty   Present Impaired Present Impaired Absent Impaired    x       x  Nails WNL Fungus Present Other        Hair  Growth Symmetrical Asymmetrical   absent       LYMPHEDEMA/ONCOLOGY QUESTIONNAIRE - 02/13/18 1624      Lymphedema Assessments   Lymphedema Assessments  Lower extremities      Right Lower Extremity Lymphedema   Other  TBA OT Rx visit 1      Left Lower Extremity Lymphedema   Other  TBA OT Rx visit 1                    OT Education - 02/13/18 1606    Education Details  Provided Pt and family skilled education regarding lymphatic structure and function, lymphedema etiologies, onset patterns and stages of progression. Discussed  impact of obesity on lymphatic system function. Outlined Complete Decongestive Therapy (CDT)  as standard of LE care and provided in depth information regarding 4 primary components of both Intensive and Self Management Phases, including Manual Lymph Drainage (MLD), compression wrapping and garments, skin care, and therapeutic exercise.   Discussed   Importance of daily, ongoing LE self-care essential to retaining clinical gains and limiting progression.  Lastly, reviewed lymphedema precautions, including cellulitis risk and difficulty with wound healing. Provided printed Lymphedema Workbook for reference.    Person(s) Educated  Patient;Spouse    Methods  Explanation;Demonstration;Handout    Comprehension  Verbalized understanding;Returned demonstration;Tactile cues required;Need further instruction          OT Long Term Goals - 02/13/18 1626      OT LONG TERM GOAL #1   Title  Pt will demonstrate understanding of lymphedema (LE) precautions / prevention principals, including signs / symptoms of cellulitis infection with modified independence using LE Workbook as printed reference to identify 6 precautions without verbal cues by end of 6th  OT Rx visit.     Baseline  Max A    Time  6    Period  Weeks    Status  New    Target Date  --   6th OT rx visit     OT LONG TERM GOAL #2   Title  Pt will be able to apply multi-layer short stretch  compression wraps using correct gradient techniques with Max CG assistance to achieve optimal limb volume reduction during Intensive Phase CDT, and to return affected limb(s) , as closely as possible, to premorbid size and shape.    Baseline  Dependent    Time  6    Status  New    Target Date  --   6th OT rx visit     OT LONG TERM GOAL #3   Title  Pt to achieve no less than 10% limb volume reduction in affected limb(s) during Intensive Phase CDT to control limb swelling, to improve  tissue integrity and immune function, to improve ADLs performance and to improve functional mobility/ transfers.    Baseline  Max A    Time  12    Period  Weeks    Status  New    Target Date  05/14/18      OT LONG TERM GOAL #4   Title  With moderate  CG assistance Pt will achieve 100% compliance with daily LE self-care home program components, including proper skin care, simple self-MLD, gradient compression wraps/ garments, and therapeutic exercise to ensure optimal Intensive Phase limb volume reduction to expedite compression garment/ device fitting.    Baseline  Max A    Time  12    Period  Weeks    Status  New    Target Date  05/14/18      OT LONG TERM GOAL #5   Title  Pt will demonstrate competent use of assistive devices during LE self-care training to improve ability to lift legs onto treatment table, to don and doff shoes and socks, and to don and doff compression garments/devices with Max  assistance to ensure optimal LE self-management over time.    Baseline  Dependent    Time  12    Period  Weeks    Status  New      Long Term Additional Goals   Additional Long Term Goals  Yes      OT LONG TERM GOAL #6   Title  Pt will retain optimal limb volume reductions achieved during Intensive Phase CDT with no more than 3% volume increase with ongoing CG assistance to limit LE progression and further functional decline.    Baseline  Dependent    Time  6    Period  Months    Status  New    Target Date   08/12/18      .ot        Patient will benefit from skilled therapeutic intervention in order to improve the following deficits and impairments:     Visit Diagnosis: Lymphedema, not elsewhere classified    Problem List There are no active problems to display for this patient.  Andrey Spearman, MS, OTR/L, CLT-LANA 109 PM, [id number]  [place] MAIN [facility] SERVICES [address] [place], [place], [address] Phone: [phone]   Fax:  [phone]  Name: Steven Meza. MRN: [id number] Date of Birth: June 29, 1930

## 2018-02-15 ENCOUNTER — Ambulatory Visit: Payer: Medicare Other | Admitting: Occupational Therapy

## 2018-02-15 DIAGNOSIS — I890 Lymphedema, not elsewhere classified: Secondary | ICD-10-CM

## 2018-02-15 NOTE — Therapy (Signed)
[place] MAIN [facility] SERVICES [address] [place], [place], [address] Phone: [phone]   Fax:  [phone]  Occupational Therapy Treatment  Patient Details  Name: Steven Meza. MRN: [id number] Date of Birth: July 04, 1930 Referring Provider (OT): Felipa Furnace, MD   Encounter Date: 02/15/2018  OT End of Session - 02/15/18 1226    Visit Number  3    Number of Visits  36    Date for OT Re-Evaluation  05/14/18    OT Start Time  1001    OT Stop Time  1115    OT Time Calculation (min)  74 min    Activity Tolerance  Patient tolerated treatment well;No increased pain    Behavior During Therapy  WFL for tasks assessed/performed       Past Medical History:  Diagnosis Date  . Cellulitis   . HBP (high blood pressure)   . Hearing loss   . Sleep apnea   . Stroke [facility])     Past Surgical History:  Procedure Laterality Date  . BLADDER SURGERY    . KNEE SURGERY Right     There were no vitals filed for this visit.  Subjective Assessment - 02/15/18 1222    Subjective   Mr Applegate arrives for OT rx visit 3/36 to address BLE lymphedema. Pt is accompanied by his wife, [name]. pT REPORTS   WHEN HIS TOES BECAME SORE EARLY THIS MORNING HE PERFORMED LYMPHTIC PUMPING THER EX AND PAIN SUBSIDED. pT REMOVED WRAPS THIS MORNING, BATHED LEG AND APPLIED topical perscribed by his doctor before attending his 10 am OT session. Pt brings all wraps, etc to session as requested. No new complaints.    Patient is accompained by:  Family member    Pertinent History  Conditions contributing to BLE lymphedema include morbid obesity, (BMI 42.13 kg/m2) OA, OSA, (declines CPap) HTN, venous stasis and recurrent cellulitis    Limitations  difficulty walking, impaired transfers and functional mobility, impaired sleep, HOH (declines hearing aids) , chronic leg swelling and associated pain, impaired LB dressing and bathing, decreased AROM at feet, ankles, knees 2/2 body habitus  and girth, impaired standing and walking balance with 3 falls in last 3 months,      Patient Stated Goals  stop he lymphorrhea, get the swelling down and keep it from getting worse.    Pain Onset  --   > 2 years                  OT Treatments/Exercises (OP) - 02/15/18 0001      ADLs   ADL Education Given  Yes      Manual Therapy   Manual Therapy  Edema management;Compression Bandaging    Compression Bandaging  4 layer gradietmn compression wrtap to LLE below the knee. Applied 0.4 cm Rosidal foam over coton stockinett w/ equal overlaps. Aplied 8 cm short stretch wrap to foot and ankle using gradient technique. Omitted toe wraps today. Applied 10 and 12 cm ss wraps circumferentially from ankle to politeal ending with single layer SS at top edge. Stretch net applied over tape.              OT Education - 02/15/18 1225    Education Details  Cont Pt and CG edu for compression wrapping    Person(s) Educated  Patient;Spouse    Methods  Explanation;Demonstration;Handout;Tactile cues;Verbal cues    Comprehension  Verbalized understanding;Returned demonstration;Tactile cues required;Need further instruction;Verbal cues required  OT Long Term Goals - 02/14/18 1355      OT LONG TERM GOAL #1   Title  Pt will demonstrate understanding of lymphedema (LE) precautions / prevention principals, including signs / symptoms of cellulitis infection with modified independence using LE Workbook as printed reference to identify 6 precautions without verbal cues by end of 6th  OT Rx visit.     Baseline  Max A    Time  6    Period  Weeks    Status  New      OT LONG TERM GOAL #2   Title  Pt will be able to apply multi-layer short stretch compression wraps using correct gradient techniques with Max CG assistance to achieve optimal limb volume reduction during Intensive Phase CDT, and to return affected limb(s) , as closely as possible, to premorbid size and shape.    Baseline   Dependent    Time  6    Status  New      OT LONG TERM GOAL #3   Title  Pt to achieve no less than 10% limb volume reduction in affected limb(s) during Intensive Phase CDT to control limb swelling, to improve tissue integrity and immune function, to improve ADLs performance and to improve functional mobility/ transfers.    Baseline  Max A    Time  12    Period  Weeks    Status  New      OT LONG TERM GOAL #4   Title  With moderate  CG assistance Pt will achieve 100% compliance with daily LE self-care home program components, including proper skin care, simple self-MLD, gradient compression wraps/ garments, and therapeutic exercise to ensure optimal Intensive Phase limb volume reduction to expedite compression garment/ device fitting.    Baseline  Max A    Time  12    Period  Weeks    Status  New      OT LONG TERM GOAL #5   Title  Pt will demonstrate competent use of assistive devices during LE self-care training to improve ability to lift legs onto treatment table, to don and doff shoes and socks, and to don and doff compression garments/devices with Max  assistance to ensure optimal LE self-management over time.    Baseline  Dependent    Time  12    Period  Weeks    Status  New      OT LONG TERM GOAL #6   Title  Pt will retain optimal limb volume reductions achieved during Intensive Phase CDT with no more than 3% volume increase with ongoing CG assistance to limit LE progression and further functional decline.    Baseline  Dependent    Time  6    Period  Months    Status  New            Plan - 02/15/18 1226    Clinical Impression Statement  By end of session spouse is able to apply gradient compression wraps using correct techniques with mod A. Pt was able to spontaneously promt his spouse to use techniques when she forgot to include the detail. Pt demonstrates a visible reduction in LLE limb volume overnight. Mild lymphorrhea and erethyma persiss at distal LLE. Commence MLD  next session proximal to fragile skin at distal leg. Cont to observe universal precautions and carefully monitor skin for signs/ symptoms of infection. Cont as per POC.    Occupational Profile and client history currently impacting functional performance  Commenced CDT  LLE today. Postponed volumetric measurements due to lymphorrhea. Provided Pt and family edu throughout session for LE etiology and self-care.  Applied knee length gradient compression wraps to LLE. Cont as per POC.    Occupational performance deficits (Please refer to evaluation for details):  ADL's;IADL's;Leisure;Rest and Sleep;Social Participation;Other   productive activities, ambulation and functional mobility   Rehab Potential  Fair    OT Frequency  3x / week    OT Duration  12 weeks    OT Treatment/Interventions  Self-care/ADL training;Therapeutic exercise;DME and/or AE instruction;Therapist, nutritional;Compression bandaging;Other (comment);Manual Therapy;Patient/family education;Therapeutic activities;Manual lymph drainage;Energy conservation   skin care w/ low ph lotion and castor oil (palma christi)   Clinical Decision Making  Multiple treatment options, significant modification of task necessary    OT Home Exercise Plan  fit with custom, BLE compression garments that are caregiver friendly for donning and doffing.    Consulted and Agree with Plan of Care  Patient;Family member/caregiver    Family Member Consulted  spouse - [name]       Patient will benefit from skilled therapeutic intervention in order to improve the following deficits and impairments:  Abnormal gait, Decreased balance, Decreased endurance, Decreased mobility, Decreased skin integrity, Difficulty walking, Impaired sensation, Obesity, Pain, Impaired perceived functional ability, Decreased knowledge of precautions, Decreased range of motion, Increased edema, Decreased knowledge of use of DME, Impaired flexibility  Visit Diagnosis: Lymphedema, not  elsewhere classified    Problem List There are no active problems to display for this patient.   Andrey Spearman, MS, OTR/L, [facility] 02/15/18 12:31 PM  [place] MAIN [facility] SERVICES [address] [place], [place], [address] Phone: [phone]   Fax:  [phone]  Name: Steven Meza. MRN: [id number] Date of Birth: 1930/10/16

## 2018-02-16 ENCOUNTER — Ambulatory Visit: Payer: Medicare Other | Admitting: Occupational Therapy

## 2018-02-16 DIAGNOSIS — I890 Lymphedema, not elsewhere classified: Secondary | ICD-10-CM

## 2018-02-16 NOTE — Therapy (Signed)
[place] MAIN [facility] SERVICES [address] [place], [place], [address] Phone: [phone]   Fax:  [phone]  Occupational Therapy Treatment  Patient Details  Name: Steven Meza. MRN: [id number] Date of Birth: [birth year] Referring Provider (OT): Felipa Furnace, MD   Encounter Date: 02/16/2018  OT End of Session - 02/16/18 1327    Visit Number  4    Number of Visits  36    Date for OT Re-Evaluation  05/14/18    OT Start Time  1100    OT Stop Time  1200    OT Time Calculation (min)  60 min    Activity Tolerance  Patient tolerated treatment well;No increased pain    Behavior During Therapy  WFL for tasks assessed/performed       Past Medical History:  Diagnosis Date  . Cellulitis   . HBP (high blood pressure)   . Hearing loss   . Sleep apnea   . Stroke [facility])     Past Surgical History:  Procedure Laterality Date  . BLADDER SURGERY    . KNEE SURGERY Right     There were no vitals filed for this visit.  Subjective Assessment - 02/16/18 1322    Subjective   Steven Meza arrives for OT rx visit 4/36 to address BLE lymphedema. Pt is accompanied by his wife, Steven Meza. Pt removed wraps , bathed skin and applied perscription ointment as instructed before arriving at session. Spouse brings clean wraps.    Patient is accompained by:  Family member    Pertinent History  Conditions contributing to BLE lymphedema include morbid obesity, (BMI 42.13 kg/m2) OA, OSA, (declines CPap) HTN, venous stasis and recurrent cellulitis    Limitations  difficulty walking, impaired transfers and functional mobility, impaired sleep, HOH (declines hearing aids) , chronic leg swelling and associated pain, impaired LB dressing and bathing, decreased AROM at feet, ankles, knees 2/2 body habitus and girth, impaired standing and walking balance with 3 falls in last 3 months,      Patient Stated Goals  stop he lymphorrhea, get the swelling down and keep it from getting  worse.    Currently in Pain?  Yes   unchanged since initial evaluation   Pain Onset  --   > 2 years                  OT Treatments/Exercises (OP) - 02/16/18 0001      ADLs   ADL Education Given  Yes      Manual Therapy   Manual Therapy  Edema management;Manual Lymphatic Drainage (MLD);Compression Bandaging    Edema Management  skin care with low ph castor oil [facility]) during MLD to LLE.    Manual Lymphatic Drainage (MLD)  MLD to LLE using functional pathways, includng short neck sequence, deep abdominals, functional inguinal LN and full leg sequence.     Compression Bandaging  4 layer gradietmn compression wrtap to LLE below the knee. Applied 0.4 cm Rosidal foam over coton stockinett w/ equal overlaps. Aplied 8 cm short stretch wrap to foot and ankle using gradient technique. Omitted toe wraps today. Applied 10 and 12 cm ss wraps circumferentially from ankle to politeal ending with single layer SS at top edge. Stretch net applied over tape.              OT Education - 02/16/18 1326    Education Details  Pt and family edu for MLD - clinical rational and anatomy re  LN structure and function and lymphatic pathways    Person(s) Educated  Patient;Spouse    Methods  Explanation;Demonstration;Handout;Tactile cues;Verbal cues    Comprehension  Verbalized understanding;Returned demonstration;Tactile cues required;Need further instruction;Verbal cues required          OT Long Term Goals - 02/14/18 1355      OT LONG TERM GOAL #1   Title  Pt will demonstrate understanding of lymphedema (LE) precautions / prevention principals, including signs / symptoms of cellulitis infection with modified independence using LE Workbook as printed reference to identify 6 precautions without verbal cues by end of 6th  OT Rx visit.     Baseline  Max A    Time  6    Period  Weeks    Status  New      OT LONG TERM GOAL #2   Title  Pt will be able to apply multi-layer short stretch  compression wraps using correct gradient techniques with Max CG assistance to achieve optimal limb volume reduction during Intensive Phase CDT, and to return affected limb(s) , as closely as possible, to premorbid size and shape.    Baseline  Dependent    Time  6    Status  New      OT LONG TERM GOAL #3   Title  Pt to achieve no less than 10% limb volume reduction in affected limb(s) during Intensive Phase CDT to control limb swelling, to improve tissue integrity and immune function, to improve ADLs performance and to improve functional mobility/ transfers.    Baseline  Max A    Time  12    Period  Weeks    Status  New      OT LONG TERM GOAL #4   Title  With moderate  CG assistance Pt will achieve 100% compliance with daily LE self-care home program components, including proper skin care, simple self-MLD, gradient compression wraps/ garments, and therapeutic exercise to ensure optimal Intensive Phase limb volume reduction to expedite compression garment/ device fitting.    Baseline  Max A    Time  12    Period  Weeks    Status  New      OT LONG TERM GOAL #5   Title  Pt will demonstrate competent use of assistive devices during LE self-care training to improve ability to lift legs onto treatment table, to don and doff shoes and socks, and to don and doff compression garments/devices with Max  assistance to ensure optimal LE self-management over time.    Baseline  Dependent    Time  12    Period  Weeks    Status  New      OT LONG TERM GOAL #6   Title  Pt will retain optimal limb volume reductions achieved during Intensive Phase CDT with no more than 3% volume increase with ongoing CG assistance to limit LE progression and further functional decline.    Baseline  Dependent    Time  6    Period  Months    Status  New            Plan - 02/16/18 1327    Clinical Impression Statement  Commenced MLD to LLE/LLQ. Pt tolerated without difficulty. Provided Pt edu at introductory level  for lymphatic structures and function of MLD. Pt tolerating compression very well and he is performing ther ex at home as directed. Spouse has mastered gradient compression wraping this week and has been supportive CG  between visits. Limb  volume of LLE below knee continues to decrease. Weeping at posterior lateral L leg persists but is decreased from earlier this first week of CDT. Cont per POC.    Occupational Profile and client history currently impacting functional performance  Commenced CDT LLE today. Postponed volumetric measurements due to lymphorrhea. Provided Pt and family edu throughout session for LE etiology and self-care.  Applied knee length gradient compression wraps to LLE. Cont as per POC.    Occupational performance deficits (Please refer to evaluation for details):  ADL's;IADL's;Leisure;Rest and Sleep;Social Participation;Other   productive activities, ambulation and functional mobility   Rehab Potential  Fair    OT Frequency  3x / week    OT Duration  12 weeks    OT Treatment/Interventions  Self-care/ADL training;Therapeutic exercise;DME and/or AE instruction;Therapist, nutritional;Compression bandaging;Other (comment);Manual Therapy;Patient/family education;Therapeutic activities;Manual lymph drainage;Energy conservation   skin care w/ low ph lotion and castor oil (palma christi)   Clinical Decision Making  Multiple treatment options, significant modification of task necessary    OT Home Exercise Plan  fit with custom, BLE compression garments that are caregiver friendly for donning and doffing.    Consulted and Agree with Plan of Care  Patient;Family member/caregiver    Family Member Consulted  spouse - Steven Meza       Patient will benefit from skilled therapeutic intervention in order to improve the following deficits and impairments:  Abnormal gait, Decreased balance, Decreased endurance, Decreased mobility, Decreased skin integrity, Difficulty walking, Impaired sensation,  Obesity, Pain, Impaired perceived functional ability, Decreased knowledge of precautions, Decreased range of motion, Increased edema, Decreased knowledge of use of DME, Impaired flexibility  Visit Diagnosis: Lymphedema, not elsewhere classified    Problem List There are no active problems to display for this patient.   Andrey Spearman, MS, OTR/L, CLT-LANA 02/16/18 1:34 PM  [place] MAIN [facility] SERVICES [address] [place], [place], [address] Phone: [phone]   Fax:  [phone]  Name: Avonte Sensabaugh. MRN: [id number] Date of Birth: 03/19/1931

## 2018-02-20 ENCOUNTER — Ambulatory Visit: Payer: Medicare Other | Admitting: Occupational Therapy

## 2018-02-20 DIAGNOSIS — I890 Lymphedema, not elsewhere classified: Secondary | ICD-10-CM | POA: Diagnosis not present

## 2018-02-20 NOTE — Therapy (Signed)
[place] MAIN [facility] SERVICES [address] [place], [place], [address] Phone: [phone]   Fax:  [phone]  Occupational Therapy Treatment  Patient Details  Name: Steven Meza. MRN: [id number] Date of Birth: 09/10/1930 Referring Provider (OT): Felipa Furnace, MD   Encounter Date: 02/20/2018  OT End of Session - 02/20/18 1431    Visit Number  5    Number of Visits  36    Date for OT Re-Evaluation  05/14/18    OT Start Time  0100    OT Stop Time  0218    OT Time Calculation (min)  78 min    Activity Tolerance  Patient tolerated treatment well;No increased pain    Behavior During Therapy  WFL for tasks assessed/performed       Past Medical History:  Diagnosis Date  . Cellulitis   . HBP (high blood pressure)   . Hearing loss   . Sleep apnea   . Stroke [facility])     Past Surgical History:  Procedure Laterality Date  . BLADDER SURGERY    . KNEE SURGERY Right     There were no vitals filed for this visit.  Subjective Assessment - 02/20/18 1429    Subjective   [name] arrives for OT rx visit 5/36 to address BLE lymphedema. Pt is accompanied by his wife, Steven Meza. Pt removed wraps , bathed skin and applied perscription ointment as instructed before arriving at session.Pt reports pain in legs has nearlly resolved since commencing CDT.    Patient is accompained by:  Family member    Pertinent History  Conditions contributing to BLE lymphedema include morbid obesity, (BMI 42.13 kg/m2) OA, OSA, (declines CPap) HTN, venous stasis and recurrent cellulitis    Limitations  difficulty walking, impaired transfers and functional mobility, impaired sleep, HOH (declines hearing aids) , chronic leg swelling and associated pain, impaired LB dressing and bathing, decreased AROM at feet, ankles, knees 2/2 body habitus and girth, impaired standing and walking balance with 3 falls in last 3 months,      Patient Stated Goals  stop he lymphorrhea, get the  swelling down and keep it from getting worse.    Currently in Pain?  No/denies    Pain Onset  --   > 2 years                  OT Treatments/Exercises (OP) - 02/20/18 0001      ADLs   ADL Education Given  Yes      Manual Therapy   Manual Therapy  Edema management;Manual Lymphatic Drainage (MLD);Compression Bandaging    Edema Management  skin care with low ph castor oil [facility]) during MLD to LLE.    Manual Lymphatic Drainage (MLD)  MLD to LLE using functional pathways, includng short neck sequence, deep abdominals, functional inguinal LN and full leg sequence.     Compression Bandaging  4 layer gradietmn compression wrtap to LLE below the knee. Applied 0.4 cm Rosidal foam over coton stockinett w/ equal overlaps. Aplied 8 cm short stretch wrap to foot and ankle using gradient technique. Omitted toe wraps today. Applied 10 and 12 cm ss wraps circumferentially from ankle to politeal ending with single layer SS at top edge. Stretch net applied over tape.              OT Education - 02/20/18 1431    Education Details  Commenced Pt and family edu for simple self-MLD.  Person(s) Educated  Patient;Spouse    Methods  Explanation;Demonstration;Handout;Tactile cues;Verbal cues    Comprehension  Verbalized understanding;Returned demonstration;Tactile cues required;Need further instruction;Verbal cues required          OT Long Term Goals - 02/14/18 1355      OT LONG TERM GOAL #1   Title  Pt will demonstrate understanding of lymphedema (LE) precautions / prevention principals, including signs / symptoms of cellulitis infection with modified independence using LE Workbook as printed reference to identify 6 precautions without verbal cues by end of 6th  OT Rx visit.     Baseline  Max A    Time  6    Period  Weeks    Status  New      OT LONG TERM GOAL #2   Title  Pt will be able to apply multi-layer short stretch compression wraps using correct gradient techniques  with Max CG assistance to achieve optimal limb volume reduction during Intensive Phase CDT, and to return affected limb(s) , as closely as possible, to premorbid size and shape.    Baseline  Dependent    Time  6    Status  New      OT LONG TERM GOAL #3   Title  Pt to achieve no less than 10% limb volume reduction in affected limb(s) during Intensive Phase CDT to control limb swelling, to improve tissue integrity and immune function, to improve ADLs performance and to improve functional mobility/ transfers.    Baseline  Max A    Time  12    Period  Weeks    Status  New      OT LONG TERM GOAL #4   Title  With moderate  CG assistance Pt will achieve 100% compliance with daily LE self-care home program components, including proper skin care, simple self-MLD, gradient compression wraps/ garments, and therapeutic exercise to ensure optimal Intensive Phase limb volume reduction to expedite compression garment/ device fitting.    Baseline  Max A    Time  12    Period  Weeks    Status  New      OT LONG TERM GOAL #5   Title  Pt will demonstrate competent use of assistive devices during LE self-care training to improve ability to lift legs onto treatment table, to don and doff shoes and socks, and to don and doff compression garments/devices with Max  assistance to ensure optimal LE self-management over time.    Baseline  Dependent    Time  12    Period  Weeks    Status  New      OT LONG TERM GOAL #6   Title  Pt will retain optimal limb volume reductions achieved during Intensive Phase CDT with no more than 3% volume increase with ongoing CG assistance to limit LE progression and further functional decline.    Baseline  Dependent    Time  6    Period  Months    Status  New            Plan - 02/20/18 1434    Clinical Impression Statement  Commenced Pt and family edu for simple self-MLD. included anatomy and demonstrated how J stroke is used to mobilize lymphatic     congestion towards  regional LN. Taught short neck sequence. By end of session Pt  was able to perform J stroke uing correct techniques with moderate assistance, including modeling and verbval cues. Provide handout next session.  Minimal lymphorrhea  observed from lateral L distal leg today. Depressed area ~ size of a silver dollar remains, but skin condition i9s steadily improvng. Pt remains compliant with all self care protocols he has learned to date. Spouse is diligently compression wrapping between visits. Cont 3 x weekly s per POC.    Occupational Profile and client history currently impacting functional performance  Commenced CDT LLE today. Postponed volumetric measurements due to lymphorrhea. Provided Pt and family edu throughout session for LE etiology and self-care.  Applied knee length gradient compression wraps to LLE. Cont as per POC.    Occupational performance deficits (Please refer to evaluation for details):  ADL's;IADL's;Leisure;Rest and Sleep;Social Participation;Other   productive activities, ambulation and functional mobility   Rehab Potential  Fair    OT Frequency  3x / week    OT Duration  12 weeks    OT Treatment/Interventions  Self-care/ADL training;Therapeutic exercise;DME and/or AE instruction;Therapist, nutritional;Compression bandaging;Other (comment);Manual Therapy;Patient/family education;Therapeutic activities;Manual lymph drainage;Energy conservation   skin care w/ low ph lotion and castor oil (palma christi)   Clinical Decision Making  Multiple treatment options, significant modification of task necessary    OT Home Exercise Plan  fit with custom, BLE compression garments that are caregiver friendly for donning and doffing.    Consulted and Agree with Plan of Care  Patient;Family member/caregiver    Family Member Consulted  spouse - Steven Meza       Patient will benefit from skilled therapeutic intervention in order to improve the following deficits and impairments:  Abnormal gait,  Decreased balance, Decreased endurance, Decreased mobility, Decreased skin integrity, Difficulty walking, Impaired sensation, Obesity, Pain, Impaired perceived functional ability, Decreased knowledge of precautions, Decreased range of motion, Increased edema, Decreased knowledge of use of DME, Impaired flexibility  Visit Diagnosis: Lymphedema, not elsewhere classified    Problem List There are no active problems to display for this patient.   Andrey Spearman, MS, OTR/L, [facility] 02/20/18 2:41 PM  [place] MAIN [facility] SERVICES [address] [place], [place], [address] Phone: [phone]   Fax:  [phone]  Name: Steven Meza. MRN: [id number] Date of Birth: Dec 12, 1930

## 2018-02-21 ENCOUNTER — Ambulatory Visit: Payer: Medicare Other | Admitting: Occupational Therapy

## 2018-02-21 DIAGNOSIS — I890 Lymphedema, not elsewhere classified: Secondary | ICD-10-CM | POA: Diagnosis not present

## 2018-02-21 NOTE — Therapy (Signed)
[place] MAIN [facility] SERVICES [address] [place], [place], [address] Phone: [phone]   Fax:  [phone]  Occupational Therapy Treatment  Patient Details  Name: Steven Meza. MRN: [id number] Date of Birth: 12/31/1930 Referring Provider (OT): Felipa Furnace, MD   Encounter Date: 02/21/2018  OT End of Session - 02/21/18 1653    Visit Number  6    Number of Visits  36    Date for OT Re-Evaluation  05/14/18    OT Start Time  0105    OT Stop Time  0205    OT Time Calculation (min)  60 min    Activity Tolerance  Patient tolerated treatment well;No increased pain    Behavior During Therapy  WFL for tasks assessed/performed       Past Medical History:  Diagnosis Date  . Cellulitis   . HBP (high blood pressure)   . Hearing loss   . Sleep apnea   . Stroke [facility])     Past Surgical History:  Procedure Laterality Date  . BLADDER SURGERY    . KNEE SURGERY Right     There were no vitals filed for this visit.  Subjective Assessment - 02/21/18 1651    Subjective   Steven Meza arrives for OT rx visit 6/36 to address BLE lymphedema. Pt is accompanied by his wife, Steven Meza. Pt reports he practiced simple self MLD 2 x yesterday.    Patient is accompained by:  Family member    Pertinent History  Conditions contributing to BLE lymphedema include morbid obesity, (BMI 42.13 kg/m2) OA, OSA, (declines CPap) HTN, venous stasis and recurrent cellulitis    Limitations  difficulty walking, impaired transfers and functional mobility, impaired sleep, HOH (declines hearing aids) , chronic leg swelling and associated pain, impaired LB dressing and bathing, decreased AROM at feet, ankles, knees 2/2 body habitus and girth, impaired standing and walking balance with 3 falls in last 3 months,      Patient Stated Goals  stop he lymphorrhea, get the swelling down and keep it from getting worse.    Pain Onset  --   > 2 years                           OT Education - 02/21/18 1652    Education Details  Continued skilled Pt/caregiver education  And LE ADL training throughout visit for lymphedema self care/ home program, including compression wrapping, compression garment and device wear/care, lymphatic pumping ther ex, simple self-MLD, and skin care. Discussed progress towards goals.     Person(s) Educated  Patient;Spouse    Methods  Explanation;Demonstration;Handout;Tactile cues;Verbal cues    Comprehension  Verbalized understanding;Returned demonstration;Tactile cues required;Need further instruction;Verbal cues required          OT Long Term Goals - 02/14/18 1355      OT LONG TERM GOAL #1   Title  Pt will demonstrate understanding of lymphedema (LE) precautions / prevention principals, including signs / symptoms of cellulitis infection with modified independence using LE Workbook as printed reference to identify 6 precautions without verbal cues by end of 6th  OT Rx visit.     Baseline  Max A    Time  6    Period  Weeks    Status  New      OT LONG TERM GOAL #2   Title  Pt will be able to apply multi-layer short stretch compression wraps using correct  gradient techniques with Max CG assistance to achieve optimal limb volume reduction during Intensive Phase CDT, and to return affected limb(s) , as closely as possible, to premorbid size and shape.    Baseline  Dependent    Time  6    Status  New      OT LONG TERM GOAL #3   Title  Pt to achieve no less than 10% limb volume reduction in affected limb(s) during Intensive Phase CDT to control limb swelling, to improve tissue integrity and immune function, to improve ADLs performance and to improve functional mobility/ transfers.    Baseline  Max A    Time  12    Period  Weeks    Status  New      OT LONG TERM GOAL #4   Title  With moderate  CG assistance Pt will achieve 100% compliance with daily LE self-care home program components,  including proper skin care, simple self-MLD, gradient compression wraps/ garments, and therapeutic exercise to ensure optimal Intensive Phase limb volume reduction to expedite compression garment/ device fitting.    Baseline  Max A    Time  12    Period  Weeks    Status  New      OT LONG TERM GOAL #5   Title  Pt will demonstrate competent use of assistive devices during LE self-care training to improve ability to lift legs onto treatment table, to don and doff shoes and socks, and to don and doff compression garments/devices with Max  assistance to ensure optimal LE self-management over time.    Baseline  Dependent    Time  12    Period  Weeks    Status  New      OT LONG TERM GOAL #6   Title  Pt will retain optimal limb volume reductions achieved during Intensive Phase CDT with no more than 3% volume increase with ongoing CG assistance to limit LE progression and further functional decline.    Baseline  Dependent    Time  6    Period  Months    Status  New            Plan - 02/21/18 1653    Clinical Impression Statement  Commenced Pt and family edu for simple self-MLD. included anatomy and demonstrated how J stroke is used to mobilize lymphatic     congestion towards regional LN. Taught short neck sequence. By end of session Pt  was able to perform J stroke uing correct techniques with moderate assistance, including modeling and verbval cues. Provide handout next session.  Minimal lymphorrhea  observed from lateral L distal leg today. Depressed area ~ size of a silver dollar remains, but skin condition i9s steadily improvng. Pt remains compliant with all self care protocols he has learned to date. Spouse is diligently compression wrapping between visits. Cont 3 x weekly s per POC.    Occupational Profile and client history currently impacting functional performance  Pt continues to show steady progress towards all OT goals. LLE limb volume is further reduced each day. Skin conditio is  improving and lymphorrhea is resolved. Pt reports only occasional pain that is muc reduced. Pt is 100 % COMPLIANT W/ SELF CARE HOME PROGRAM COMPONENTS LEARNED THUS FAR. sPOSE CONTINUES TO DILIGENtly assist w/ wrapping between visits. Pt tolerated MLD and compressionw raps to LLE today. L heel is tender again today by repor,t. Cont as per POC.    Occupational performance deficits (Please refer to evaluation  for details):  ADL's;IADL's;Leisure;Rest and Sleep;Social Participation;Other   productive activities, ambulation and functional mobility   Rehab Potential  Fair    OT Frequency  3x / week    OT Duration  12 weeks    OT Treatment/Interventions  Self-care/ADL training;Therapeutic exercise;DME and/or AE instruction;Therapist, nutritional;Compression bandaging;Other (comment);Manual Therapy;Patient/family education;Therapeutic activities;Manual lymph drainage;Energy conservation   skin care w/ low ph lotion and castor oil (palma christi)   Clinical Decision Making  Multiple treatment options, significant modification of task necessary    OT Home Exercise Plan  fit with custom, BLE compression garments that are caregiver friendly for donning and doffing.    Consulted and Agree with Plan of Care  Patient;Family member/caregiver    Family Member Consulted  spouse - Steven Meza       Patient will benefit from skilled therapeutic intervention in order to improve the following deficits and impairments:  Abnormal gait, Decreased balance, Decreased endurance, Decreased mobility, Decreased skin integrity, Difficulty walking, Impaired sensation, Obesity, Pain, Impaired perceived functional ability, Decreased knowledge of precautions, Decreased range of motion, Increased edema, Decreased knowledge of use of DME, Impaired flexibility  Visit Diagnosis: Lymphedema, not elsewhere classified    Problem List There are no active problems to display for this patient.   Andrey Spearman, MS, OTR/L,  [facility] 02/21/18 4:57 PM  [place] MAIN [facility] SERVICES [address] [place], [place], [address] Phone: [phone]   Fax:  [phone]  Name: Steven Meza. MRN: [id number] Date of Birth: 1930-08-15

## 2018-02-23 ENCOUNTER — Ambulatory Visit: Payer: Medicare Other | Admitting: Occupational Therapy

## 2018-02-23 DIAGNOSIS — I890 Lymphedema, not elsewhere classified: Secondary | ICD-10-CM | POA: Diagnosis not present

## 2018-02-23 NOTE — Therapy (Signed)
[place] MAIN [facility] SERVICES [address] [place], [place], [address] Phone: [phone]   Fax:  [phone]  Occupational Therapy Treatment  Patient Details  Name: Steven Meza. MRN: [id number] Date of Birth: 01/29/1931 Referring Provider (OT): Felipa Furnace, MD   Encounter Date: 02/23/2018  OT End of Session - 02/23/18 1657    Visit Number  7    Number of Visits  36    Date for OT Re-Evaluation  05/14/18    OT Start Time  0100    OT Stop Time  0205    OT Time Calculation (min)  65 min    Activity Tolerance  Patient tolerated treatment well;No increased pain    Behavior During Therapy  WFL for tasks assessed/performed       Past Medical History:  Diagnosis Date  . Cellulitis   . HBP (high blood pressure)   . Hearing loss   . Sleep apnea   . Stroke [facility])     Past Surgical History:  Procedure Laterality Date  . BLADDER SURGERY    . KNEE SURGERY Right     There were no vitals filed for this visit.  Subjective Assessment - 02/23/18 1653    Subjective   Mr Oestreich arrives for OT rx visit 7/36 to address BLE lymphedema. Pt is accompanied by his wife, [name]. She brings clean compression wraps for use today. Pt proudly reports he performed ther ex and simple self-MLD yesterday 2 x.     Patient is accompained by:  Family member    Pertinent History  Conditions contributing to BLE lymphedema include morbid obesity, (BMI 42.13 kg/m2) OA, OSA, (declines CPap) HTN, venous stasis and recurrent cellulitis    Limitations  difficulty walking, impaired transfers and functional mobility, impaired sleep, HOH (declines hearing aids) , chronic leg swelling and associated pain, impaired LB dressing and bathing, decreased AROM at feet, ankles, knees 2/2 body habitus and girth, impaired standing and walking balance with 3 falls in last 3 months,      Patient Stated Goals  stop he lymphorrhea, get the swelling down and keep it from getting worse.     Currently in Pain?  Yes   not rated numerically today   Pain Location  Ankle    Pain Orientation  Left;Lateral;Lower    Pain Descriptors / Indicators  Tender    Pain Type  Chronic pain    Pain Onset  --   > 2 years   Pain Frequency  Intermittent                   OT Treatments/Exercises (OP) - 02/23/18 0001      ADLs   ADL Education Given  Yes      Manual Therapy   Manual Therapy  Edema management;Manual Lymphatic Drainage (MLD);Compression Bandaging    Edema Management  skin care with low ph castor oil [facility]) during MLD to LLE.    Manual Lymphatic Drainage (MLD)  MLD to LLE using functional pathways, includng short neck sequence, deep abdominals, functional inguinal LN and full leg sequence.     Compression Bandaging  4 layer gradietmn compression wrtap to LLE below the knee. Applied 0.4 cm Rosidal foam over coton stockinett w/ equal overlaps. Aplied 8 cm short stretch wrap to foot and ankle using gradient technique. Omitted toe wraps today. Applied 10 and 12 cm ss wraps circumferentially from ankle to politeal ending with single layer SS at top edge. Stretch  net applied over tape.              OT Education - 02/23/18 1656    Education Details  Continued skilled Pt/caregiver education  And LE ADL training throughout visit for lymphedema self care/ home program, including compression wrapping, compression garment and device wear/care, lymphatic pumping ther ex, simple self-MLD, and skin care. Discussed progress towards goals.     Person(s) Educated  Patient;Spouse    Methods  Explanation;Demonstration;Handout;Tactile cues;Verbal cues    Comprehension  Verbalized understanding;Returned demonstration;Tactile cues required;Need further instruction;Verbal cues required          OT Long Term Goals - 02/14/18 1355      OT LONG TERM GOAL #1   Title  Pt will demonstrate understanding of lymphedema (LE) precautions / prevention principals, including signs /  symptoms of cellulitis infection with modified independence using LE Workbook as printed reference to identify 6 precautions without verbal cues by end of 6th  OT Rx visit.     Baseline  Max A    Time  6    Period  Weeks    Status  New      OT LONG TERM GOAL #2   Title  Pt will be able to apply multi-layer short stretch compression wraps using correct gradient techniques with Max CG assistance to achieve optimal limb volume reduction during Intensive Phase CDT, and to return affected limb(s) , as closely as possible, to premorbid size and shape.    Baseline  Dependent    Time  6    Status  New      OT LONG TERM GOAL #3   Title  Pt to achieve no less than 10% limb volume reduction in affected limb(s) during Intensive Phase CDT to control limb swelling, to improve tissue integrity and immune function, to improve ADLs performance and to improve functional mobility/ transfers.    Baseline  Max A    Time  12    Period  Weeks    Status  New      OT LONG TERM GOAL #4   Title  With moderate  CG assistance Pt will achieve 100% compliance with daily LE self-care home program components, including proper skin care, simple self-MLD, gradient compression wraps/ garments, and therapeutic exercise to ensure optimal Intensive Phase limb volume reduction to expedite compression garment/ device fitting.    Baseline  Max A    Time  12    Period  Weeks    Status  New      OT LONG TERM GOAL #5   Title  Pt will demonstrate competent use of assistive devices during LE self-care training to improve ability to lift legs onto treatment table, to don and doff shoes and socks, and to don and doff compression garments/devices with Max  assistance to ensure optimal LE self-management over time.    Baseline  Dependent    Time  12    Period  Weeks    Status  New      OT LONG TERM GOAL #6   Title  Pt will retain optimal limb volume reductions achieved during Intensive Phase CDT with no more than 3% volume increase  with ongoing CG assistance to limit LE progression and further functional decline.    Baseline  Dependent    Time  6    Period  Months    Status  New            Plan - 02/23/18 1657  Clinical Impression Statement  Pt continues to demonstrate steady progress towards all OT goals for LE care. Skin condition on LLE below the knee continues to improve . Hydration is incrasing andd skin pliability and flexability during MLD is improved, making it possible to decongest lymphatic fluid towards proximal LN. Pt tolerated all aspectas of manual therapy today. Pt and spouce  particpated in self care training. Cont as per POC.    Occupational Profile and client history currently impacting functional performance  Pt continues to show steady progress towards all OT goals. LLE limb volume is further reduced each day. Skin conditio is improving and lymphorrhea is resolved. Pt reports only occasional pain that is muc reduced. Pt is 100 % COMPLIANT W/ SELF CARE HOME PROGRAM COMPONENTS LEARNED THUS FAR. sPOSE CONTINUES TO DILIGENtly assist w/ wrapping between visits. Pt tolerated MLD and compressionw raps to LLE today. L heel is tender again today by repor,t. Cont as per POC.    Occupational performance deficits (Please refer to evaluation for details):  ADL's;IADL's;Leisure;Rest and Sleep;Social Participation;Other   productive activities, ambulation and functional mobility   Rehab Potential  Fair    OT Frequency  3x / week    OT Duration  12 weeks    OT Treatment/Interventions  Self-care/ADL training;Therapeutic exercise;DME and/or AE instruction;Therapist, nutritional;Compression bandaging;Other (comment);Manual Therapy;Patient/family education;Therapeutic activities;Manual lymph drainage;Energy conservation   skin care w/ low ph lotion and castor oil (palma christi)   Clinical Decision Making  Multiple treatment options, significant modification of task necessary    OT Home Exercise Plan  fit with  custom, BLE compression garments that are caregiver friendly for donning and doffing.    Consulted and Agree with Plan of Care  Patient;Family member/caregiver    Family Member Consulted  spouse - [name]       Patient will benefit from skilled therapeutic intervention in order to improve the following deficits and impairments:  Abnormal gait, Decreased balance, Decreased endurance, Decreased mobility, Decreased skin integrity, Difficulty walking, Impaired sensation, Obesity, Pain, Impaired perceived functional ability, Decreased knowledge of precautions, Decreased range of motion, Increased edema, Decreased knowledge of use of DME, Impaired flexibility  Visit Diagnosis: Lymphedema, not elsewhere classified    Problem List There are no active problems to display for this patient.   Andrey Spearman, MS, OTR/L, [facility] 02/23/18 5:00 PM  [place] MAIN [facility] SERVICES [address] [place], [place], [address] Phone: [phone]   Fax:  [phone]  Name: Steven Meza. MRN: [id number] Date of Birth: [birth year]

## 2018-02-28 ENCOUNTER — Ambulatory Visit: Payer: Medicare Other | Admitting: Occupational Therapy

## 2018-02-28 DIAGNOSIS — I890 Lymphedema, not elsewhere classified: Secondary | ICD-10-CM | POA: Diagnosis not present

## 2018-03-01 NOTE — Therapy (Signed)
[place] MAIN [facility] SERVICES [address] [place], [place], [address] Phone: [phone]   Fax:  [phone]  Occupational Therapy Treatment  Patient Details  Name: Steven Meza. MRN: [id number] Date of Birth: 11-27-1930 Referring Provider (OT): Steven Furnace, MD   Encounter Date: 02/28/2018  OT End of Session - 02/28/18 1131    Visit Number  8    Number of Visits  36    Date for OT Re-Evaluation  05/14/18    OT Start Time  0100    OT Stop Time  0215    OT Time Calculation (min)  75 min    Activity Tolerance  Patient tolerated treatment well;No increased pain    Behavior During Therapy  WFL for tasks assessed/performed       Past Medical History:  Diagnosis Date  . Cellulitis   . HBP (high blood pressure)   . Hearing loss   . Sleep apnea   . Stroke [facility])     Past Surgical History:  Procedure Laterality Date  . BLADDER SURGERY    . KNEE SURGERY Right     There were no vitals filed for this visit.  Subjective Assessment - 02/28/18 1129    Subjective   Steven Meza arrives for OT rx visit 8/36 to address BLE lymphedema. Pt is accompanied by his wife, Steven Meza. Pt states he is satisfied with his response to OT for CDT thus far. Pt and spouse have no new complaints.    Patient is accompained by:  Family member    Pertinent History  Conditions contributing to BLE lymphedema include morbid obesity, (BMI 42.13 kg/m2) OA, OSA, (declines CPap) HTN, venous stasis and recurrent cellulitis    Limitations  difficulty walking, impaired transfers and functional mobility, impaired sleep, HOH (declines hearing aids) , chronic leg swelling and associated pain, impaired LB dressing and bathing, decreased AROM at feet, ankles, knees 2/2 body habitus and girth, impaired standing and walking balance with 3 falls in last 3 months,      Patient Stated Goals  stop he lymphorrhea, get the swelling down and keep it from getting worse.    Pain Onset  --    > 2 years                  OT Treatments/Exercises (OP) - 03/01/18 0001      ADLs   ADL Education Given  Yes      Manual Therapy   Manual Therapy  Edema management;Manual Lymphatic Drainage (MLD);Compression Bandaging    Edema Management  skin care with low ph castor oil [facility]) during MLD to LLE.    Manual Lymphatic Drainage (MLD)  MLD to LLE using functional pathways, includng short neck sequence, deep abdominals, functional inguinal LN and full leg sequence.     Compression Bandaging  4 layer gradietmn compression wrtap to LLE below the knee. Applied 0.4 cm Rosidal foam over coton stockinett w/ equal overlaps. Aplied 8 cm short stretch wrap to foot and ankle using gradient technique. Omitted toe wraps today. Applied 10 and 12 cm ss wraps circumferentially from ankle to politeal ending with single layer SS at top edge. Stretch net applied over tape.              OT Education - 02/28/18 1132    Education Details  Continued skilled Pt/caregiver education  And LE ADL training throughout visit for lymphedema self care/ home program, including compression wrapping, compression garment and device  wear/care, lymphatic pumping ther ex, simple self-MLD, and skin care. Discussed progress towards goals.     Person(s) Educated  Patient;Spouse    Methods  Explanation;Demonstration;Handout;Tactile cues;Verbal cues    Comprehension  Verbalized understanding;Returned demonstration;Tactile cues required;Need further instruction;Verbal cues required          OT Long Term Goals - 02/14/18 1355      OT LONG TERM GOAL #1   Title  Pt will demonstrate understanding of lymphedema (LE) precautions / prevention principals, including signs / symptoms of cellulitis infection with modified independence using LE Workbook as printed reference to identify 6 precautions without verbal cues by end of 6th  OT Rx visit.     Baseline  Max A    Time  6    Period  Weeks    Status  New       OT LONG TERM GOAL #2   Title  Pt will be able to apply multi-layer short stretch compression wraps using correct gradient techniques with Max CG assistance to achieve optimal limb volume reduction during Intensive Phase CDT, and to return affected limb(s) , as closely as possible, to premorbid size and shape.    Baseline  Dependent    Time  6    Status  New      OT LONG TERM GOAL #3   Title  Pt to achieve no less than 10% limb volume reduction in affected limb(s) during Intensive Phase CDT to control limb swelling, to improve tissue integrity and immune function, to improve ADLs performance and to improve functional mobility/ transfers.    Baseline  Max A    Time  12    Period  Weeks    Status  New      OT LONG TERM GOAL #4   Title  With moderate  CG assistance Pt will achieve 100% compliance with daily LE self-care home program components, including proper skin care, simple self-MLD, gradient compression wraps/ garments, and therapeutic exercise to ensure optimal Intensive Phase limb volume reduction to expedite compression garment/ device fitting.    Baseline  Max A    Time  12    Period  Weeks    Status  New      OT LONG TERM GOAL #5   Title  Pt will demonstrate competent use of assistive devices during LE self-care training to improve ability to lift legs onto treatment table, to don and doff shoes and socks, and to don and doff compression garments/devices with Max  assistance to ensure optimal LE self-management over time.    Baseline  Dependent    Time  12    Period  Weeks    Status  New      OT LONG TERM GOAL #6   Title  Pt will retain optimal limb volume reductions achieved during Intensive Phase CDT with no more than 3% volume increase with ongoing CG assistance to limit LE progression and further functional decline.    Baseline  Dependent    Time  6    Period  Months    Status  New            Plan - 02/28/18 1132    Clinical Impression Statement  Provuided skin  care, MLD and comnpression wraps to LLE as established. Provided Pt edu for LE self care throughout session. Skin condition continues to improve and swelling is decreasing. Pt is performing home program components daily as instructed. Cont aas per POC.  Occupational Profile and client history currently impacting functional performance  Pt continues to show steady progress towards all OT goals. LLE limb volume is further reduced each day. Skin conditio is improving and lymphorrhea is resolved. Pt reports only occasional pain that is muc reduced. Pt is 100 % COMPLIANT W/ SELF CARE HOME PROGRAM COMPONENTS LEARNED THUS FAR. sPOSE CONTINUES TO DILIGENtly assist w/ wrapping between visits. Pt tolerated MLD and compressionw raps to LLE today. L heel is tender again today by repor,t. Cont as per POC.    Occupational performance deficits (Please refer to evaluation for details):  ADL's;IADL's;Leisure;Rest and Sleep;Social Participation;Other   productive activities, ambulation and functional mobility   Rehab Potential  Fair    OT Frequency  3x / week    OT Duration  12 weeks    OT Treatment/Interventions  Self-care/ADL training;Therapeutic exercise;DME and/or AE instruction;Therapist, nutritional;Compression bandaging;Other (comment);Manual Therapy;Patient/family education;Therapeutic activities;Manual lymph drainage;Energy conservation   skin care w/ low ph lotion and castor oil ([place])   Clinical Decision Making  Multiple treatment options, significant modification of task necessary    OT Home Exercise Plan  fit with custom, BLE compression garments that are caregiver friendly for donning and doffing.    Consulted and Agree with Plan of Care  Patient;Family member/caregiver    Family Member Consulted  spouse - Steven Meza       Patient will benefit from skilled therapeutic intervention in order to improve the following deficits and impairments:  Abnormal gait, Decreased balance, Decreased  endurance, Decreased mobility, Decreased skin integrity, Difficulty walking, Impaired sensation, Obesity, Pain, Impaired perceived functional ability, Decreased knowledge of precautions, Decreased range of motion, Increased edema, Decreased knowledge of use of DME, Impaired flexibility  Visit Diagnosis: Lymphedema, not elsewhere classified    Problem List There are no active problems to display for this patient.   Andrey Spearman, MS, OTR/L, [facility] 03/01/18 11:36 AM  [place] MAIN [facility] SERVICES [address] [place], [place], [address] Phone: [phone]   Fax:  [phone]  Name: Azariah Bonura. MRN: [id number] Date of Birth: [birth year]

## 2018-03-02 ENCOUNTER — Ambulatory Visit: Payer: Medicare Other | Admitting: Occupational Therapy

## 2018-03-02 DIAGNOSIS — I890 Lymphedema, not elsewhere classified: Secondary | ICD-10-CM

## 2018-03-02 NOTE — Therapy (Signed)
[place] MAIN [facility] SERVICES [address] [place], [place], [address] Phone: [phone]   Fax:  [phone]  Occupational Therapy Treatment  Patient Details  Name: Steven Meza. MRN: [id number] Date of Birth: 22-Dec-1930 Referring Provider (OT): Felipa Furnace, MD   Encounter Date: 03/02/2018  OT End of Session - 03/02/18 1307    Visit Number  9  (Pended)     Number of Visits  65  (Pended)     Date for OT Re-Evaluation  05/14/18  (Pended)     OT Start Time  0100  (Pended)     Activity Tolerance  Patient tolerated treatment well;No increased pain  (Pended)     Behavior During Therapy  WFL for tasks assessed/performed  (Pended)        Past Medical History:  Diagnosis Date  . Cellulitis   . HBP (high blood pressure)   . Hearing loss   . Sleep apnea   . Stroke [facility])     Past Surgical History:  Procedure Laterality Date  . BLADDER SURGERY    . KNEE SURGERY Right     There were no vitals filed for this visit.  Subjective Assessment - 03/02/18 1306    Subjective   Steven Meza arrives for OT rx visit 9/36 to address BLE lymphedema. Pt is accompanied by his wife, [name]. Pt states his legs are so much better. " I have very little pain. I used to hate to get up and turn off the TV. Now I dont mind. I want to start walking more." Reviewed Walking Program in LE Workbook w/ Pt. Recommended he start 5 mins at a time using rolling walker w seat.    Patient is accompained by:  Family member    Pertinent History  Conditions contributing to BLE lymphedema include morbid obesity, (BMI 42.13 kg/m2) OA, OSA, (declines CPap) HTN, venous stasis and recurrent cellulitis    Limitations  difficulty walking, impaired transfers and functional mobility, impaired sleep, HOH (declines hearing aids) , chronic leg swelling and associated pain, impaired LB dressing and bathing, decreased AROM at feet, ankles, knees 2/2 body habitus and girth, impaired standing  and walking balance with 3 falls in last 3 months,      Patient Stated Goals  stop he lymphorrhea, get the swelling down and keep it from getting worse.    Currently in Pain?  No/denies    Pain Onset  --   > 2 years         LYMPHEDEMA/ONCOLOGY QUESTIONNAIRE - 03/02/18 1623      Right Lower Extremity Lymphedema   Other  RLE limb volume for leg section from ankle to groin measures 4544.07 ml.       Left Lower Extremity Lymphedema   Other  LLE limb volume for leg from ankle to below knee (A-D) measures 4971.31 ml. Limb vbolume differential measures 9.4%, L>R.              OT Treatments/Exercises (OP) - 03/02/18 0001      ADLs   ADL Education Given  Yes      Manual Therapy   Manual Therapy  Edema management;Manual Lymphatic Drainage (MLD);Compression Bandaging    Manual therapy comments  Completed comparative BLE limb voujmetrics.    Edema Management  skin care with low ph castor oil [facility]) during MLD to LLE.    Manual Lymphatic Drainage (MLD)  MLD to LLE using functional pathways, includng short neck sequence, deep abdominals,  functional inguinal LN and full leg sequence.     Compression Bandaging  4 layer gradietmn compression wrtap to LLE below the knee. Applied 0.4 cm Rosidal foam over coton stockinett w/ equal overlaps. Aplied 8 cm short stretch wrap to foot and ankle using gradient technique. Omitted toe wraps today. Applied 10 and 12 cm ss wraps circumferentially from ankle to politeal ending with single layer SS at top edge. Stretch net applied over tape.              OT Education - 03/02/18 1623    Education Details  Continued skilled Pt/caregiver education  And LE ADL training throughout visit for lymphedema self care/ home program, including compression wrapping, compression garment and device wear/care, lymphatic pumping ther ex, simple self-MLD, and skin care. Discussed progress towards goals.     Person(s) Educated  Patient;Spouse    Methods   Explanation;Demonstration;Handout;Tactile cues;Verbal cues    Comprehension  Verbalized understanding;Returned demonstration;Tactile cues required;Need further instruction;Verbal cues required          OT Long Term Goals - 02/14/18 1355      OT LONG TERM GOAL #1   Title  Pt will demonstrate understanding of lymphedema (LE) precautions / prevention principals, including signs / symptoms of cellulitis infection with modified independence using LE Workbook as printed reference to identify 6 precautions without verbal cues by end of 6th  OT Rx visit.     Baseline  Max A    Time  6    Period  Weeks    Status  New      OT LONG TERM GOAL #2   Title  Pt will be able to apply multi-layer short stretch compression wraps using correct gradient techniques with Max CG assistance to achieve optimal limb volume reduction during Intensive Phase CDT, and to return affected limb(s) , as closely as possible, to premorbid size and shape.    Baseline  Dependent    Time  6    Status  New      OT LONG TERM GOAL #3   Title  Pt to achieve no less than 10% limb volume reduction in affected limb(s) during Intensive Phase CDT to control limb swelling, to improve tissue integrity and immune function, to improve ADLs performance and to improve functional mobility/ transfers.    Baseline  Max A    Time  12    Period  Weeks    Status  New      OT LONG TERM GOAL #4   Title  With moderate  CG assistance Pt will achieve 100% compliance with daily LE self-care home program components, including proper skin care, simple self-MLD, gradient compression wraps/ garments, and therapeutic exercise to ensure optimal Intensive Phase limb volume reduction to expedite compression garment/ device fitting.    Baseline  Max A    Time  12    Period  Weeks    Status  New      OT LONG TERM GOAL #5   Title  Pt will demonstrate competent use of assistive devices during LE self-care training to improve ability to lift legs onto  treatment table, to don and doff shoes and socks, and to don and doff compression garments/devices with Max  assistance to ensure optimal LE self-management over time.    Baseline  Dependent    Time  12    Period  Weeks    Status  New      OT LONG TERM GOAL #6  Title  Pt will retain optimal limb volume reductions achieved during Intensive Phase CDT with no more than 3% volume increase with ongoing CG assistance to limit LE progression and further functional decline.    Baseline  Dependent    Time  6    Period  Months    Status  New            Plan - 03/02/18 1626    Clinical Impression Statement  Completed BLE comparative limb volumetrics today. RLE limb volume for leg section from ankle to tibial tuberosity (A-D)  measures 4544.07 ml.  LLE limb volume for leg from ankle to below knee (A-D) measures 4971.31 ml. Limb vbolume differential measures 9.4%, L>R.Marland Kitchen Unfortunately OT failed to complete baseling limb volumetrics on first Rx day due to time constraints for dressing lymphorrhea, but estimate LLE limb volume has decreased by > 10% since commencing CDT. Skin condition continues to improve and Lymphorrhea and distal leg pain has resolved by report. pROVIDED mld AND SKIN CARE AFTER MEASURING, , THEN REAPPLIED COMPRESSION wraps to LLE. Cont as per OPC.    Occupational Profile and client history currently impacting functional performance  --    Occupational performance deficits (Please refer to evaluation for details):  ADL's;IADL's;Leisure;Rest and Sleep;Social Participation;Other   productive activities, ambulation and functional mobility   Rehab Potential  Fair    OT Frequency  3x / week    OT Duration  12 weeks    OT Treatment/Interventions  Self-care/ADL training;Therapeutic exercise;DME and/or AE instruction;Therapist, nutritional;Compression bandaging;Other (comment);Manual Therapy;Patient/family education;Therapeutic activities;Manual lymph drainage;Energy conservation    skin care w/ low ph lotion and castor oil ([place])   Clinical Decision Making  Multiple treatment options, significant modification of task necessary    OT Home Exercise Plan  fit with custom, BLE compression garments that are caregiver friendly for donning and doffing.    Consulted and Agree with Plan of Care  Patient;Family member/caregiver    Family Member Consulted  spouse - [name]       Patient will benefit from skilled therapeutic intervention in order to improve the following deficits and impairments:  Abnormal gait, Decreased balance, Decreased endurance, Decreased mobility, Decreased skin integrity, Difficulty walking, Impaired sensation, Obesity, Pain, Impaired perceived functional ability, Decreased knowledge of precautions, Decreased range of motion, Increased edema, Decreased knowledge of use of DME, Impaired flexibility  Visit Diagnosis: Lymphedema, not elsewhere classified    Problem List There are no active problems to display for this patient.   Andrey Spearman, MS, OTR/L, [facility] 03/02/18 4:31 PM  [place] MAIN [facility] SERVICES [address] [place], [place], [address] Phone: [phone]   Fax:  [phone]  Name: Steven Meza. MRN: [id number] Date of Birth: [birth year]

## 2018-03-06 ENCOUNTER — Ambulatory Visit: Payer: Medicare Other | Attending: Internal Medicine | Admitting: Occupational Therapy

## 2018-03-06 DIAGNOSIS — I890 Lymphedema, not elsewhere classified: Secondary | ICD-10-CM

## 2018-03-07 ENCOUNTER — Ambulatory Visit: Payer: Medicare Other | Admitting: Occupational Therapy

## 2018-03-07 DIAGNOSIS — I890 Lymphedema, not elsewhere classified: Secondary | ICD-10-CM

## 2018-03-07 NOTE — Therapy (Signed)
[place] MAIN [facility] SERVICES [address] [place], [place], [address] Phone: [phone]   Fax:  [phone]  Occupational Therapy Treatment Note and Progress Report  Patient Details  Name: Steven Meza. MRN: [id number] Date of Birth: Oct 19, 1930 Referring Provider (OT): Felipa Furnace, MD   Encounter Date: 03/06/2018  OT End of Session - 03/07/18 1454    Visit Number  9    Number of Visits  36    Date for OT Re-Evaluation  05/14/18    Activity Tolerance  Patient tolerated treatment well;No increased pain    Behavior During Therapy  WFL for tasks assessed/performed       Past Medical History:  Diagnosis Date  . Cellulitis   . HBP (high blood pressure)   . Hearing loss   . Sleep apnea   . Stroke [facility])     Past Surgical History:  Procedure Laterality Date  . BLADDER SURGERY    . KNEE SURGERY Right     There were no vitals filed for this visit.  Subjective Assessment - 03/06/18 1456    Subjective   [name] arrives for OT rx visit 10/36 to address BLE lymphedema. Pt is accompanied by his wife, Steven Meza. Pt reports he continues to do lymphatic pumping ther ex 2 or more times daily when legs begin to hurt, and they help relieve pain.     Patient is accompained by:  Family member    Pertinent History  Conditions contributing to BLE lymphedema include morbid obesity, (BMI 42.13 kg/m2) OA, OSA, (declines CPap) HTN, venous stasis and recurrent cellulitis    Limitations  difficulty walking, impaired transfers and functional mobility, impaired sleep, HOH (declines hearing aids) , chronic leg swelling and associated pain, impaired LB dressing and bathing, decreased AROM at feet, ankles, knees 2/2 body habitus and girth, impaired standing and walking balance with 3 falls in last 3 months,      Patient Stated Goals  stop he lymphorrhea, get the swelling down and keep it from getting worse.    Currently in Pain?  No/denies    Pain Onset  --    > 2 years                  OT Treatments/Exercises (OP) - 03/07/18 0001      ADLs   ADL Education Given  Yes      Manual Therapy   Manual Therapy  Edema management;Manual Lymphatic Drainage (MLD);Compression Bandaging    Edema Management  skin care with low ph castor oil [facility]) during MLD to LLE.    Manual Lymphatic Drainage (MLD)  MLD to LLE using functional pathways, includng short neck sequence, deep abdominals, functional inguinal LN and full leg sequence.     Compression Bandaging  4 layer gradietmn compression wrtap to LLE below the knee. Applied 0.4 cm Rosidal foam over coton stockinett w/ equal overlaps. Aplied 8 cm short stretch wrap to foot and ankle using gradient technique. Omitted toe wraps today. Applied 10 and 12 cm ss wraps circumferentially from ankle to politeal ending with single layer SS at top edge. Stretch net applied over tape.              OT Education - 03/07/18 1458    Education Details  Pt edu today regarding compression garment options and clinical recommendations for optimal containment     Person(s) Educated  Patient;Spouse    Methods  Explanation;Demonstration    Comprehension  Verbalized  understanding;Returned demonstration          OT Long Term Goals - 03/07/18 1459      OT LONG TERM GOAL #1   Title  Pt will demonstrate understanding of lymphedema (LE) precautions / prevention principals, including signs / symptoms of cellulitis infection with modified independence using LE Workbook as printed reference to identify 6 precautions without verbal cues by end of 6th  OT Rx visit.     Baseline  Max A    Time  6    Period  Weeks    Status  Achieved      OT LONG TERM GOAL #2   Title  Pt will be able to apply multi-layer short stretch compression wraps using correct gradient techniques with Max CG assistance to achieve optimal limb volume reduction during Intensive Phase CDT, and to return affected limb(s) , as closely as  possible, to premorbid size and shape.    Baseline  Dependent    Time  6    Status  Achieved      OT LONG TERM GOAL #3   Title  Pt to achieve no less than 10% limb volume reduction in affected limb(s) during Intensive Phase CDT to control limb swelling, to improve tissue integrity and immune function, to improve ADLs performance and to improve functional mobility/ transfers.    Baseline  Max A 03/07/2018:ACHIEVED FOR lle    Time  12    Period  Weeks      OT LONG TERM GOAL #4   Title  With moderate  CG assistance Pt will achieve 100% compliance with daily LE self-care home program components, including proper skin care, simple self-MLD, gradient compression wraps/ garments, and therapeutic exercise to ensure optimal Intensive Phase limb volume reduction to expedite compression garment/ device fitting.    Baseline  Max A    Time  12    Period  Weeks    Status  Achieved      OT LONG TERM GOAL #5   Title  Pt will demonstrate competent use of assistive devices during LE self-care training to improve ability to lift legs onto treatment table, to don and doff shoes and socks, and to don and doff compression garments/devices with Max  assistance to ensure optimal LE self-management over time.    Baseline  Dependent    Time  12    Period  Weeks    Status  Achieved      OT LONG TERM GOAL #6   Title  Pt will retain optimal limb volume reductions achieved during Intensive Phase CDT with no more than 3% volume increase with ongoing CG assistance to limit LE progression and further functional decline.    Baseline  Dependent    Time  6    Period  Months    Status  New            Plan - 03/07/18 1503    Clinical Impression Statement  Provided MLD, skin care and compression therapy to LLE with excellent tolerance by Pt. Provided edu re compression garment options and recommendations to achieve optimal containment while ensuring Pt is able to don and doff using assistive devices only as spouse  declines to assist with daily garments. Proided several physical examples of both custom flat knit and off the shelf compression garments for  review and discussed pros and cons of both. Although Pt is likely to require custom flat knit garments for optimal LE management, Pt and spouse would  like to try the less expensive OTS option first due to lower cost. Anatomical measurements reveal Pt fits into Juzo sz 4, but team agreesa we'll order a size 5 in effort to make donning and doffing easier. Pt has made excellent progress towardas all OT goals for LE care. See LT Goals sections for detail. Cont as per POC.    Occupational performance deficits (Please refer to evaluation for details):  ADL's;IADL's;Leisure;Rest and Sleep;Social Participation;Other   productive activities, ambulation and functional mobility   Rehab Potential  Fair    OT Frequency  3x / week    OT Duration  12 weeks    OT Treatment/Interventions  Self-care/ADL training;Therapeutic exercise;DME and/or AE instruction;Therapist, nutritional;Compression bandaging;Other (comment);Manual Therapy;Patient/family education;Therapeutic activities;Manual lymph drainage;Energy conservation   skin care w/ low ph lotion and castor oil ([place])   Clinical Decision Making  Multiple treatment options, significant modification of task necessary    OT Home Exercise Plan  fit with custom, BLE compression garments that are caregiver friendly for donning and doffing.    Consulted and Agree with Plan of Care  Patient;Family member/caregiver    Family Member Consulted  spouse - Steven Meza       Patient will benefit from skilled therapeutic intervention in order to improve the following deficits and impairments:  Abnormal gait, Decreased balance, Decreased endurance, Decreased mobility, Decreased skin integrity, Difficulty walking, Impaired sensation, Obesity, Pain, Impaired perceived functional ability, Decreased knowledge of precautions, Decreased  range of motion, Increased edema, Decreased knowledge of use of DME, Impaired flexibility  Visit Diagnosis: Lymphedema, not elsewhere classified    Problem List There are no active problems to display for this patient.   Andrey Spearman, MS, OTR/L, [facility] 03/07/18 3:08 PM   [place] MAIN [facility] SERVICES [address] [place], [place], [address] Phone: [phone]   Fax:  [phone]  Name: Hezzie Karim. MRN: [id number] Date of Birth: 06/26/1930

## 2018-03-07 NOTE — Therapy (Signed)
[place] MAIN [facility] SERVICES [address] [place], [place], [address] Phone: [phone]   Fax:  [phone]  Occupational Therapy Treatment  Patient Details  Name: Steven Meza. MRN: [id number] Date of Birth: 04-18-1931 Referring Provider (OT): Felipa Furnace, MD   Encounter Date: 03/07/2018  OT End of Session - 03/07/18 1603    Visit Number  11    Number of Visits  36    Date for OT Re-Evaluation  05/14/18    OT Start Time  0100    OT Stop Time  0205    OT Time Calculation (min)  65 min    Activity Tolerance  Patient tolerated treatment well;No increased pain    Behavior During Therapy  WFL for tasks assessed/performed       Past Medical History:  Diagnosis Date  . Cellulitis   . HBP (high blood pressure)   . Hearing loss   . Sleep apnea   . Stroke [facility])     Past Surgical History:  Procedure Laterality Date  . BLADDER SURGERY    . KNEE SURGERY Right     There were no vitals filed for this visit.  Subjective Assessment - 03/07/18 1600    Subjective   Steven Meza arrives for OT rx visit 11/36 to address BLE lymphedema. Pt is accompanied by his wife, Steven Meza. Pt has no new complaints today. Spouse states she feels they need to " go with less expensive garments for now. " OT reintterated that it's not possible to guarantee the clinical outcome with any garments at this stage.    Patient is accompained by:  Family member    Pertinent History  Conditions contributing to BLE lymphedema include morbid obesity, (BMI 42.13 kg/m2) OA, OSA, (declines CPap) HTN, venous stasis and recurrent cellulitis    Limitations  difficulty walking, impaired transfers and functional mobility, impaired sleep, HOH (declines hearing aids) , chronic leg swelling and associated pain, impaired LB dressing and bathing, decreased AROM at feet, ankles, knees 2/2 body habitus and girth, impaired standing and walking balance with 3 falls in last 3 months,      Patient Stated Goals  stop he lymphorrhea, get the swelling down and keep it from getting worse.    Currently in Pain?  No/denies    Pain Onset  --   > 2 years                  OT Treatments/Exercises (OP) - 03/07/18 1602      ADLs   ADL Education Given  Yes      Manual Therapy   Manual Therapy  Edema management;Manual Lymphatic Drainage (MLD);Compression Bandaging    Edema Management  skin care with low ph castor oil [facility]) during MLD to LLE.    Manual Lymphatic Drainage (MLD)  MLD to LLE using functional pathways, includng short neck sequence, deep abdominals, functional inguinal LN and full leg sequence.     Compression Bandaging  4 layer gradietmn compression wrtap to LLE below the knee. Applied 0.4 cm Rosidal foam over coton stockinett w/ equal overlaps. Aplied 8 cm short stretch wrap to foot and ankle using gradient technique. Omitted toe wraps today. Applied 10 and 12 cm ss wraps circumferentially from ankle to politeal ending with single layer SS at top edge. Stretch net applied over tape.              OT Education - 03/07/18 1603    Education Details  Continued skilled Pt/caregiver education  And LE ADL training throughout visit for lymphedema self care/ home program, including compression wrapping, compression garment and device wear/care, lymphatic pumping ther ex, simple self-MLD, and skin care. Discussed progress towards goals.     Person(s) Educated  Patient;Spouse    Methods  Explanation;Demonstration;Handout;Tactile cues;Verbal cues    Comprehension  Verbalized understanding;Returned demonstration;Tactile cues required;Need further instruction;Verbal cues required          OT Long Term Goals - 03/07/18 1459      OT LONG TERM GOAL #1   Title  Pt will demonstrate understanding of lymphedema (LE) precautions / prevention principals, including signs / symptoms of cellulitis infection with modified independence using LE Workbook as printed  reference to identify 6 precautions without verbal cues by end of 6th  OT Rx visit.     Baseline  Max A    Time  6    Period  Weeks    Status  Achieved      OT LONG TERM GOAL #2   Title  Pt will be able to apply multi-layer short stretch compression wraps using correct gradient techniques with Max CG assistance to achieve optimal limb volume reduction during Intensive Phase CDT, and to return affected limb(s) , as closely as possible, to premorbid size and shape.    Baseline  Dependent    Time  6    Status  Achieved      OT LONG TERM GOAL #3   Title  Pt to achieve no less than 10% limb volume reduction in affected limb(s) during Intensive Phase CDT to control limb swelling, to improve tissue integrity and immune function, to improve ADLs performance and to improve functional mobility/ transfers.    Baseline  Max A 03/07/2018:ACHIEVED FOR lle    Time  12    Period  Weeks      OT LONG TERM GOAL #4   Title  With moderate  CG assistance Pt will achieve 100% compliance with daily LE self-care home program components, including proper skin care, simple self-MLD, gradient compression wraps/ garments, and therapeutic exercise to ensure optimal Intensive Phase limb volume reduction to expedite compression garment/ device fitting.    Baseline  Max A    Time  12    Period  Weeks    Status  Achieved      OT LONG TERM GOAL #5   Title  Pt will demonstrate competent use of assistive devices during LE self-care training to improve ability to lift legs onto treatment table, to don and doff shoes and socks, and to don and doff compression garments/devices with Max  assistance to ensure optimal LE self-management over time.    Baseline  Dependent    Time  12    Period  Weeks    Status  Achieved      OT LONG TERM GOAL #6   Title  Pt will retain optimal limb volume reductions achieved during Intensive Phase CDT with no more than 3% volume increase with ongoing CG assistance to limit LE progression and  further functional decline.    Baseline  Dependent    Time  6    Period  Months    Status  New            Plan - 03/07/18 1604    Clinical Impression Statement  Provided MLD, skin care and compression therapy to LLE with excellent tolerance by Pt. Provided edu re compression garment options and recommendations to achieve  optimal containment. Cont as per POC.    Occupational performance deficits (Please refer to evaluation for details):  ADL's;IADL's;Leisure;Rest and Sleep;Social Participation;Other   productive activities, ambulation and functional mobility   Rehab Potential  Fair    OT Frequency  3x / week    OT Duration  12 weeks    OT Treatment/Interventions  Self-care/ADL training;Therapeutic exercise;DME and/or AE instruction;Therapist, nutritional;Compression bandaging;Other (comment);Manual Therapy;Patient/family education;Therapeutic activities;Manual lymph drainage;Energy conservation   skin care w/ low ph lotion and castor oil ([place])   Clinical Decision Making  Multiple treatment options, significant modification of task necessary    OT Home Exercise Plan  fit with custom, BLE compression garments that are caregiver friendly for donning and doffing.    Consulted and Agree with Plan of Care  Patient;Family member/caregiver    Family Member Consulted  spouse - Steven Meza       Patient will benefit from skilled therapeutic intervention in order to improve the following deficits and impairments:  Abnormal gait, Decreased balance, Decreased endurance, Decreased mobility, Decreased skin integrity, Difficulty walking, Impaired sensation, Obesity, Pain, Impaired perceived functional ability, Decreased knowledge of precautions, Decreased range of motion, Increased edema, Decreased knowledge of use of DME, Impaired flexibility  Visit Diagnosis: Lymphedema, not elsewhere classified    Problem List There are no active problems to display for this patient.   Andrey Spearman, MS, OTR/L, [facility] 03/07/18 4:06 PM  [place] MAIN [facility] SERVICES [address] [place], [place], [address] Phone: [phone]   Fax:  [phone]  Name: Steven Meza. MRN: [id number] Date of Birth: July 01, 1930

## 2018-03-09 ENCOUNTER — Ambulatory Visit: Payer: Medicare Other | Admitting: Occupational Therapy

## 2018-03-09 DIAGNOSIS — I890 Lymphedema, not elsewhere classified: Secondary | ICD-10-CM

## 2018-03-09 NOTE — Therapy (Signed)
[place] MAIN [name] SERVICES [address] [place], [place], [address] Phone: [phone]   Fax:  [phone]  Occupational Therapy Treatment  Patient Details  Name: Steven Meza. MRN: [id number] Date of Birth: [birth year] Referring Provider (OT): Felipa Furnace, MD   Encounter Date: 03/09/2018  OT End of Session - 03/09/18 1604    Visit Number  12    Number of Visits  36    Date for OT Re-Evaluation  05/14/18    OT Start Time  0100    OT Stop Time  0200    OT Time Calculation (min)  60 min    Activity Tolerance  Patient tolerated treatment well;No increased pain    Behavior During Therapy  WFL for tasks assessed/performed       Past Medical History:  Diagnosis Date  . Cellulitis   . HBP (high blood pressure)   . Hearing loss   . Sleep apnea   . Stroke [facility])     Past Surgical History:  Procedure Laterality Date  . BLADDER SURGERY    . KNEE SURGERY Right     There were no vitals filed for this visit.  Subjective Assessment - 03/09/18 1602    Subjective   [name] arrives for OT rx visit 12/36 to address BLE lymphedema. Pt is accompanied by his wife, [name]. Pt has no new complaints today. Spouse states she feels they need to " go with less expensive garments for now. " OT reintterated that it's not possible to guarantee the clinical outcome with any garments at this stage.    Patient is accompained by:  Family member    Pertinent History  Conditions contributing to BLE lymphedema include morbid obesity, (BMI 42.13 kg/m2) OA, OSA, (declines CPap) HTN, venous stasis and recurrent cellulitis    Limitations  difficulty walking, impaired transfers and functional mobility, impaired sleep, HOH (declines hearing aids) , chronic leg swelling and associated pain, impaired LB dressing and bathing, decreased AROM at feet, ankles, knees 2/2 body habitus and girth, impaired standing and walking balance with 3 falls in last 3 months,      Patient Stated Goals  stop he lymphorrhea, get the swelling down and keep it from getting worse.    Currently in Pain?  No/denies    Pain Onset  --   > 2 years                  OT Treatments/Exercises (OP) - 03/09/18 0001      ADLs   ADL Education Given  Yes      Manual Therapy   Manual Therapy  Edema management;Myofascial release;Manual Lymphatic Drainage (MLD);Compression Bandaging    Manual therapy comments  completed anatomical measurements for Juzo knee highs and provided multiple web based links for online purchase     Edema Management  skin care with low ph castor oil [facility]) during MLD to LLE.    Manual Lymphatic Drainage (MLD)  MLD to LLE using functional pathways, includng short neck sequence, deep abdominals, functional inguinal LN and full leg sequence.     Compression Bandaging  4 layer gradietmn compression wrtap to LLE below the knee. Applied 0.4 cm Rosidal foam over coton stockinett w/ equal overlaps. Aplied 8 cm short stretch wrap to foot and ankle using gradient technique. Omitted toe wraps today. Applied 10 and 12 cm ss wraps circumferentially from ankle to politeal ending with single layer SS at top edge. Stretch net applied  over tape.              OT Education - 03/09/18 1603    Education Details  Pt and family edu for LE self care: where to purchase off the shelf compression garments on line and how to order.     Person(s) Educated  Patient;Spouse    Methods  Explanation;Demonstration;Handout;Verbal cues    Comprehension  Verbalized understanding;Returned demonstration;Verbal cues required          OT Long Term Goals - 03/07/18 1459      OT LONG TERM GOAL #1   Title  Pt will demonstrate understanding of lymphedema (LE) precautions / prevention principals, including signs / symptoms of cellulitis infection with modified independence using LE Workbook as printed reference to identify 6 precautions without verbal cues by end of 6th  OT  Rx visit.     Baseline  Max A    Time  6    Period  Weeks    Status  Achieved      OT LONG TERM GOAL #2   Title  Pt will be able to apply multi-layer short stretch compression wraps using correct gradient techniques with Max CG assistance to achieve optimal limb volume reduction during Intensive Phase CDT, and to return affected limb(s) , as closely as possible, to premorbid size and shape.    Baseline  Dependent    Time  6    Status  Achieved      OT LONG TERM GOAL #3   Title  Pt to achieve no less than 10% limb volume reduction in affected limb(s) during Intensive Phase CDT to control limb swelling, to improve tissue integrity and immune function, to improve ADLs performance and to improve functional mobility/ transfers.    Baseline  Max A 03/07/2018:ACHIEVED FOR lle    Time  12    Period  Weeks      OT LONG TERM GOAL #4   Title  With moderate  CG assistance Pt will achieve 100% compliance with daily LE self-care home program components, including proper skin care, simple self-MLD, gradient compression wraps/ garments, and therapeutic exercise to ensure optimal Intensive Phase limb volume reduction to expedite compression garment/ device fitting.    Baseline  Max A    Time  12    Period  Weeks    Status  Achieved      OT LONG TERM GOAL #5   Title  Pt will demonstrate competent use of assistive devices during LE self-care training to improve ability to lift legs onto treatment table, to don and doff shoes and socks, and to don and doff compression garments/devices with Max  assistance to ensure optimal LE self-management over time.    Baseline  Dependent    Time  12    Period  Weeks    Status  Achieved      OT LONG TERM GOAL #6   Title  Pt will retain optimal limb volume reductions achieved during Intensive Phase CDT with no more than 3% volume increase with ongoing CG assistance to limit LE progression and further functional decline.    Baseline  Dependent    Time  6    Period   Months    Status  New            Plan - 03/09/18 1604    Clinical Impression Statement  Completed anatomical measurements of LLE and sized off-the-shelf Juzo Dynamic, ccl 2 compression stockings. Recommending silicone top band and  open toe, and size 5 for ease of donning. Provided multiple on line resources for ordering, and provided printed exam0ple of order form. Prtovided MLD , skin care and LLE  compression wrap for remainder of session. Pt continues to make steady progress towards all goals. Cont as per POC.    Occupational performance deficits (Please refer to evaluation for details):  ADL's;IADL's;Leisure;Rest and Sleep;Social Participation;Other   productive activities, ambulation and functional mobility   Rehab Potential  Fair    OT Frequency  3x / week    OT Duration  12 weeks    OT Treatment/Interventions  Self-care/ADL training;Therapeutic exercise;DME and/or AE instruction;Therapist, nutritional;Compression bandaging;Other (comment);Manual Therapy;Patient/family education;Therapeutic activities;Manual lymph drainage;Energy conservation   skin care w/ low ph lotion and castor oil ([facility]i)   Clinical Decision Making  Multiple treatment options, significant modification of task necessary    OT Home Exercise Plan  fit with custom, BLE compression garments that are caregiver friendly for donning and doffing.    Consulted and Agree with Plan of Care  Patient;Family member/caregiver    Family Member Consulted  spouse - [name]       Patient will benefit from skilled therapeutic intervention in order to improve the following deficits and impairments:  Abnormal gait, Decreased balance, Decreased endurance, Decreased mobility, Decreased skin integrity, Difficulty walking, Impaired sensation, Obesity, Pain, Impaired perceived functional ability, Decreased knowledge of precautions, Decreased range of motion, Increased edema, Decreased knowledge of use of DME, Impaired  flexibility  Visit Diagnosis: Lymphedema, not elsewhere classified    Problem List There are no active problems to display for this patient.   Andrey Spearman, MS, OTR/L, [facility] 03/09/18 4:08 PM  [place] MAIN [facility] SERVICES [address] [place], [place], [address] Phone: [phone]   Fax:  [phone]  Name: Hurley Sobel. MRN: [id number] Date of Birth: 06-16-1930

## 2018-03-14 ENCOUNTER — Ambulatory Visit: Payer: Medicare Other | Admitting: Occupational Therapy

## 2018-03-14 DIAGNOSIS — I890 Lymphedema, not elsewhere classified: Secondary | ICD-10-CM | POA: Diagnosis not present

## 2018-03-14 NOTE — Therapy (Signed)
[place] MAIN [facility] SERVICES [address] [place], [place], [address] Phone: [phone]   Fax:  [phone]  Occupational Therapy Treatment  Patient Details  Name: Steven Meza. MRN: [id number] Date of Birth: July 12, 1930 Referring Provider (OT): Felipa Furnace, MD   Encounter Date: 03/14/2018  OT End of Session - 03/14/18 1639    Visit Number  13    Number of Visits  36    Date for OT Re-Evaluation  05/14/18    OT Start Time  0111    OT Stop Time  0200    OT Time Calculation (min)  49 min    Activity Tolerance  Patient tolerated treatment well;No increased pain    Behavior During Therapy  WFL for tasks assessed/performed       Past Medical History:  Diagnosis Date  . Cellulitis   . HBP (high blood pressure)   . Hearing loss   . Sleep apnea   . Stroke [facility])     Past Surgical History:  Procedure Laterality Date  . BLADDER SURGERY    . KNEE SURGERY Right     There were no vitals filed for this visit.  Subjective Assessment - 03/14/18 1634    Subjective   Steven Meza arrives for OT rx visit 14/36 to address BLE lymphedema. Pt is accompanied by his wife, Steven Meza. Pt has no new complaints related to lymphedema.    Patient is accompained by:  Family member    Pertinent History  Conditions contributing to BLE lymphedema include morbid obesity, (BMI 42.13 kg/m2) OA, OSA, (declines CPap) HTN, venous stasis and recurrent cellulitis    Limitations  difficulty walking, impaired transfers and functional mobility, impaired sleep, HOH (declines hearing aids) , chronic leg swelling and associated pain, impaired LB dressing and bathing, decreased AROM at feet, ankles, knees 2/2 body habitus and girth, impaired standing and walking balance with 3 falls in last 3 months,      Patient Stated Goals  stop he lymphorrhea, get the swelling down and keep it from getting worse.    Currently in Pain?  No/denies    Pain Onset  --   > 2 years                   OT Treatments/Exercises (OP) - 03/14/18 0001      ADLs   ADL Education Given  Yes      Manual Therapy   Manual Therapy  Edema management;Myofascial release;Manual Lymphatic Drainage (MLD);Compression Bandaging    Edema Management  skin care with low ph castor oil [facility]) during MLD to LLE.    Manual Lymphatic Drainage (MLD)  MLD to RLE today using functional pathways, includng short neck sequence, deep abdominals, functional inguinal LN and full leg sequence.     Compression Bandaging  BLE compression wraps today using  2 ss wraps over foam on R and 3 layers over Rosidal as established on the L:             OT Education - 03/14/18 1639    Education Details  Continued skilled Pt/caregiver education  And LE ADL training throughout visit for lymphedema self care/ home program, including compression wrapping, compression garment and device wear/care, lymphatic pumping ther ex, simple self-MLD, and skin care. Discussed progress towards goals.     Person(s) Educated  Patient;Spouse    Methods  Explanation;Demonstration;Handout;Verbal cues    Comprehension  Verbalized understanding;Returned demonstration;Verbal cues required  OT Long Term Goals - 03/07/18 1459      OT LONG TERM GOAL #1   Title  Pt will demonstrate understanding of lymphedema (LE) precautions / prevention principals, including signs / symptoms of cellulitis infection with modified independence using LE Workbook as printed reference to identify 6 precautions without verbal cues by end of 6th  OT Rx visit.     Baseline  Max A    Time  6    Period  Weeks    Status  Achieved      OT LONG TERM GOAL #2   Title  Pt will be able to apply multi-layer short stretch compression wraps using correct gradient techniques with Max CG assistance to achieve optimal limb volume reduction during Intensive Phase CDT, and to return affected limb(s) , as closely as possible, to premorbid size  and shape.    Baseline  Dependent    Time  6    Status  Achieved      OT LONG TERM GOAL #3   Title  Pt to achieve no less than 10% limb volume reduction in affected limb(s) during Intensive Phase CDT to control limb swelling, to improve tissue integrity and immune function, to improve ADLs performance and to improve functional mobility/ transfers.    Baseline  Max A 03/07/2018:ACHIEVED FOR lle    Time  12    Period  Weeks      OT LONG TERM GOAL #4   Title  With moderate  CG assistance Pt will achieve 100% compliance with daily LE self-care home program components, including proper skin care, simple self-MLD, gradient compression wraps/ garments, and therapeutic exercise to ensure optimal Intensive Phase limb volume reduction to expedite compression garment/ device fitting.    Baseline  Max A    Time  12    Period  Weeks    Status  Achieved      OT LONG TERM GOAL #5   Title  Pt will demonstrate competent use of assistive devices during LE self-care training to improve ability to lift legs onto treatment table, to don and doff shoes and socks, and to don and doff compression garments/devices with Max  assistance to ensure optimal LE self-management over time.    Baseline  Dependent    Time  12    Period  Weeks    Status  Achieved      OT LONG TERM GOAL #6   Title  Pt will retain optimal limb volume reductions achieved during Intensive Phase CDT with no more than 3% volume increase with ongoing CG assistance to limit LE progression and further functional decline.    Baseline  Dependent    Time  6    Period  Months    Status  New            Plan - 03/14/18 1640    Clinical Impression Statement  RLE appeares more reddened today than previously below the knee. Skin temperature is not hot and no other signs of infection are noted. Tissue density is increased. Commenced MLD, sk8in care and knee length compression wrqapping to RLE today. Wrapped LLE as established. Will continue to  monitor skin condition carefully.  Spouse has not yet purchased recommended compression stockings.    Occupational performance deficits (Please refer to evaluation for details):  ADL's;IADL's;Leisure;Rest and Sleep;Social Participation;Other   productive activities, ambulation and functional mobility   Rehab Potential  Fair    OT Frequency  3x / week  OT Duration  12 weeks    OT Treatment/Interventions  Self-care/ADL training;Therapeutic exercise;DME and/or AE instruction;Therapist, nutritional;Compression bandaging;Other (comment);Manual Therapy;Patient/family education;Therapeutic activities;Manual lymph drainage;Energy conservation   skin care w/ low ph lotion and castor oil ([facility]i)   Clinical Decision Making  Multiple treatment options, significant modification of task necessary    OT Home Exercise Plan  fit with custom, BLE compression garments that are caregiver friendly for donning and doffing.    Consulted and Agree with Plan of Care  Patient;Family member/caregiver    Family Member Consulted  spouse - Steven Meza       Patient will benefit from skilled therapeutic intervention in order to improve the following deficits and impairments:  Abnormal gait, Decreased balance, Decreased endurance, Decreased mobility, Decreased skin integrity, Difficulty walking, Impaired sensation, Obesity, Pain, Impaired perceived functional ability, Decreased knowledge of precautions, Decreased range of motion, Increased edema, Decreased knowledge of use of DME, Impaired flexibility  Visit Diagnosis: Lymphedema, not elsewhere classified    Problem List There are no active problems to display for this patient.   Andrey Spearman, MS, OTR/L, [facility] 03/14/18 4:44 PM  [place] MAIN [facility] SERVICES [address] [place], [place], [address] Phone: [phone]   Fax:  [phone]  Name: Steven Meza. MRN: [id number] Date of Birth: 02-Aug-1930

## 2018-03-16 ENCOUNTER — Ambulatory Visit: Payer: Medicare Other | Admitting: Occupational Therapy

## 2018-03-16 DIAGNOSIS — I890 Lymphedema, not elsewhere classified: Secondary | ICD-10-CM | POA: Diagnosis not present

## 2018-03-16 NOTE — Therapy (Signed)
[place] MAIN [facility] SERVICES [address] [place], [place], [address] Phone: [phone]   Fax:  [phone]  Occupational Therapy Treatment  Patient Details  Name: Steven Meza. MRN: [id number] Date of Birth: 09-21-1930 Referring Provider (OT): Felipa Furnace, MD   Encounter Date: 03/16/2018  OT End of Session - 03/16/18 1613    Visit Number  14    Number of Visits  36    Date for OT Re-Evaluation  05/14/18    OT Start Time  0102    OT Stop Time  0205    OT Time Calculation (min)  63 min    Activity Tolerance  Patient tolerated treatment well;No increased pain    Behavior During Therapy  WFL for tasks assessed/performed       Past Medical History:  Diagnosis Date  . Cellulitis   . HBP (high blood pressure)   . Hearing loss   . Sleep apnea   . Stroke [facility]     Past Surgical History:  Procedure Laterality Date  . BLADDER SURGERY    . KNEE SURGERY Right     There were no vitals filed for this visit.  Subjective Assessment - 03/16/18 1611    Subjective   Steven Meza arrives for OT rx visit 15/36 to address BLE lymphedema. Pt is accompanied by his wife, Steven Meza. Steven Meza reports that she ordered OTS compression stockings a couple of days ago.    Patient is accompained by:  Family member    Pertinent History  Conditions contributing to BLE lymphedema include morbid obesity, (BMI 42.13 kg/m2) OA, OSA, (declines CPap) HTN, venous stasis and recurrent cellulitis    Limitations  difficulty walking, impaired transfers and functional mobility, impaired sleep, HOH (declines hearing aids) , chronic leg swelling and associated pain, impaired LB dressing and bathing, decreased AROM at feet, ankles, knees 2/2 body habitus and girth, impaired standing and walking balance with 3 falls in last 3 months,      Patient Stated Goals  stop he lymphorrhea, get the swelling down and keep it from getting worse.    Currently in Pain?  No/denies    Pain  Onset  --   > 2 years                  OT Treatments/Exercises (OP) - 03/16/18 0001      ADLs   ADL Education Given  Yes      Manual Therapy   Manual Therapy  Edema management;Myofascial release;Manual Lymphatic Drainage (MLD);Compression Bandaging    Edema Management  skin care with low ph castor oil [facility]) during MLD to LLE.    Manual Lymphatic Drainage (MLD)  MLD to RLE today using functional pathways, includng short neck sequence, deep abdominals, functional inguinal LN and full leg sequence.     Compression Bandaging  BLE compression wraps today using  2 ss wraps over foam on R and 3 layers over Rosidal as established on the L:             OT Education - 03/16/18 1612    Education Details  Continued skilled Pt/caregiver education  And LE ADL training throughout visit for lymphedema self care/ home program, including compression wrapping, compression garment and device wear/care, lymphatic pumping ther ex, simple self-MLD, and skin care. Discussed progress towards goals.     Person(s) Educated  Patient;Spouse    Methods  Explanation;Demonstration;Handout;Verbal cues    Comprehension  Verbalized understanding;Returned demonstration;Verbal cues required  OT Long Term Goals - 03/07/18 1459      OT LONG TERM GOAL #1   Title  Pt will demonstrate understanding of lymphedema (LE) precautions / prevention principals, including signs / symptoms of cellulitis infection with modified independence using LE Workbook as printed reference to identify 6 precautions without verbal cues by end of 6th  OT Rx visit.     Baseline  Max A    Time  6    Period  Weeks    Status  Achieved      OT LONG TERM GOAL #2   Title  Pt will be able to apply multi-layer short stretch compression wraps using correct gradient techniques with Max CG assistance to achieve optimal limb volume reduction during Intensive Phase CDT, and to return affected limb(s) , as closely as  possible, to premorbid size and shape.    Baseline  Dependent    Time  6    Status  Achieved      OT LONG TERM GOAL #3   Title  Pt to achieve no less than 10% limb volume reduction in affected limb(s) during Intensive Phase CDT to control limb swelling, to improve tissue integrity and immune function, to improve ADLs performance and to improve functional mobility/ transfers.    Baseline  Max A 03/07/2018:ACHIEVED FOR lle    Time  12    Period  Weeks      OT LONG TERM GOAL #4   Title  With moderate  CG assistance Pt will achieve 100% compliance with daily LE self-care home program components, including proper skin care, simple self-MLD, gradient compression wraps/ garments, and therapeutic exercise to ensure optimal Intensive Phase limb volume reduction to expedite compression garment/ device fitting.    Baseline  Max A    Time  12    Period  Weeks    Status  Achieved      OT LONG TERM GOAL #5   Title  Pt will demonstrate competent use of assistive devices during LE self-care training to improve ability to lift legs onto treatment table, to don and doff shoes and socks, and to don and doff compression garments/devices with Max  assistance to ensure optimal LE self-management over time.    Baseline  Dependent    Time  12    Period  Weeks    Status  Achieved      OT LONG TERM GOAL #6   Title  Pt will retain optimal limb volume reductions achieved during Intensive Phase CDT with no more than 3% volume increase with ongoing CG assistance to limit LE progression and further functional decline.    Baseline  Dependent    Time  6    Period  Months    Status  New            Plan - 03/16/18 1613    Clinical Impression Statement  Provided MLD to RLE, skin care  and compression wraps bilaterally. Pt tolerated without difficulty. Wounds on lateral L leg continue to heal well. No signs/ symptoms of infection.Cont as per POC.    Occupational performance deficits (Please refer to evaluation  for details):  ADL's;IADL's;Leisure;Rest and Sleep;Social Participation;Other   productive activities, ambulation and functional mobility   Rehab Potential  Fair    OT Frequency  3x / week    OT Duration  12 weeks    OT Treatment/Interventions  Self-care/ADL training;Therapeutic exercise;DME and/or AE instruction;Therapist, nutritional;Compression bandaging;Other (comment);Manual Therapy;Patient/family education;Therapeutic activities;Manual lymph drainage;Energy  conservation   skin care w/ low ph lotion and castor oil ([facility]i)   Clinical Decision Making  Multiple treatment options, significant modification of task necessary    OT Home Exercise Plan  fit with custom, BLE compression garments that are caregiver friendly for donning and doffing.    Consulted and Agree with Plan of Care  Patient;Family member/caregiver    Family Member Consulted  spouse - Steven Meza       Patient will benefit from skilled therapeutic intervention in order to improve the following deficits and impairments:  Abnormal gait, Decreased balance, Decreased endurance, Decreased mobility, Decreased skin integrity, Difficulty walking, Impaired sensation, Obesity, Pain, Impaired perceived functional ability, Decreased knowledge of precautions, Decreased range of motion, Increased edema, Decreased knowledge of use of DME, Impaired flexibility  Visit Diagnosis: Lymphedema, not elsewhere classified    Problem List There are no active problems to display for this patient.   Andrey Spearman, MS, OTR/L, [facility] 03/16/18 4:16 PM  [place] MAIN [facility] SERVICES [address] [place], [place], [address] Phone: [phone]   Fax:  [phone]  Name: Jaelyn Cloninger. MRN: [id number] Date of Birth: Apr 07, 1931

## 2018-03-20 ENCOUNTER — Ambulatory Visit: Payer: Medicare Other | Admitting: Occupational Therapy

## 2018-03-20 DIAGNOSIS — I890 Lymphedema, not elsewhere classified: Secondary | ICD-10-CM

## 2018-03-20 NOTE — Therapy (Signed)
[place] MAIN [facility] SERVICES [address] [place], [place], [address] Phone: [phone]   Fax:  [phone]  Occupational Therapy Treatment  Patient Details  Name: Steven Meza. MRN: [id number] Date of Birth: January 25, 1931 Referring Provider (OT): Felipa Furnace, MD   Encounter Date: 03/20/2018  OT End of Session - 03/20/18 1602    Visit Number  15    Number of Visits  36    Date for OT Re-Evaluation  05/14/18    OT Start Time  0100    OT Stop Time  0200    OT Time Calculation (min)  60 min    Activity Tolerance  Patient tolerated treatment well;No increased pain    Behavior During Therapy  WFL for tasks assessed/performed       Past Medical History:  Diagnosis Date  . Cellulitis   . HBP (high blood pressure)   . Hearing loss   . Sleep apnea   . Stroke [facility])     Past Surgical History:  Procedure Laterality Date  . BLADDER SURGERY    . KNEE SURGERY Right     There were no vitals filed for this visit.  Subjective Assessment - 03/20/18 1558    Subjective   [name] arrives for OT rx visit 16/36 to address BLE lymphedema. Pt is accompanied by his wife, Steven Meza. Pt brings recommended compression knee highs to clinic today. Pt has no new complaints.    Patient is accompained by:  Family member    Pertinent History  Conditions contributing to BLE lymphedema include morbid obesity, (BMI 42.13 kg/m2) OA, OSA, (declines CPap) HTN, venous stasis and recurrent cellulitis    Limitations  difficulty walking, impaired transfers and functional mobility, impaired sleep, HOH (declines hearing aids) , chronic leg swelling and associated pain, impaired LB dressing and bathing, decreased AROM at feet, ankles, knees 2/2 body habitus and girth, impaired standing and walking balance with 3 falls in last 3 months,      Patient Stated Goals  stop he lymphorrhea, get the swelling down and keep it from getting worse.    Currently in Pain?  No/denies    Pain Onset  --   > 2 years                  OT Treatments/Exercises (OP) - 03/20/18 0001      ADLs   ADL Education Given  Yes      Manual Therapy   Manual Therapy  Edema management;Myofascial release;Manual Lymphatic Drainage (MLD);Compression Bandaging    Manual Lymphatic Drainage (MLD)  MLD to LLE today using functional pathways, includng short neck sequence, deep abdominals, functional inguinal LN and full leg sequence.     Compression Bandaging  Fit and assessed ORS Juzo , ccl 2 , knee high compression garments             OT Education - 03/20/18 1600    Education Details  Pt and family edu for donning and doffing compression garments using assistive devidces and supported positioning. Discussed fit and function and troubleshooting compression garments issues. EDU for wear and care regimes, including laundry. and when to re-order    Person(s) Educated  Patient;Spouse    Methods  Explanation;Demonstration;Handout;Verbal cues    Comprehension  Verbalized understanding;Returned demonstration;Verbal cues required          OT Long Term Goals - 03/07/18 1459      OT LONG TERM GOAL #1   Title  Pt  will demonstrate understanding of lymphedema (LE) precautions / prevention principals, including signs / symptoms of cellulitis infection with modified independence using LE Workbook as printed reference to identify 6 precautions without verbal cues by end of 6th  OT Rx visit.     Baseline  Max A    Time  6    Period  Weeks    Status  Achieved      OT LONG TERM GOAL #2   Title  Pt will be able to apply multi-layer short stretch compression wraps using correct gradient techniques with Max CG assistance to achieve optimal limb volume reduction during Intensive Phase CDT, and to return affected limb(s) , as closely as possible, to premorbid size and shape.    Baseline  Dependent    Time  6    Status  Achieved      OT LONG TERM GOAL #3   Title  Pt to achieve no less  than 10% limb volume reduction in affected limb(s) during Intensive Phase CDT to control limb swelling, to improve tissue integrity and immune function, to improve ADLs performance and to improve functional mobility/ transfers.    Baseline  Max A 03/07/2018:ACHIEVED FOR lle    Time  12    Period  Weeks      OT LONG TERM GOAL #4   Title  With moderate  CG assistance Pt will achieve 100% compliance with daily LE self-care home program components, including proper skin care, simple self-MLD, gradient compression wraps/ garments, and therapeutic exercise to ensure optimal Intensive Phase limb volume reduction to expedite compression garment/ device fitting.    Baseline  Max A    Time  12    Period  Weeks    Status  Achieved      OT LONG TERM GOAL #5   Title  Pt will demonstrate competent use of assistive devices during LE self-care training to improve ability to lift legs onto treatment table, to don and doff shoes and socks, and to don and doff compression garments/devices with Max  assistance to ensure optimal LE self-management over time.    Baseline  Dependent    Time  12    Period  Weeks    Status  Achieved      OT LONG TERM GOAL #6   Title  Pt will retain optimal limb volume reductions achieved during Intensive Phase CDT with no more than 3% volume increase with ongoing CG assistance to limit LE progression and further functional decline.    Baseline  Dependent    Time  6    Period  Months    Status  New            Plan - 03/20/18 1602    Clinical Impression Statement  Fit and assessed OTS compression garments. Fit and function appears appropriate and Pt expresses comfort in garments. Majority of visit devoted to Pt edu for donning and doffing garmnents using friction-style work gloves and Tyvek sock. Provided MLD, skin care and compression garments at end of session.  Steven Meza states she is trying to get  an appointment with dermatologist to assess red stained area on Pt's  foot. This red marking ghas now migrated up ~ 10 cm medial ankle . Pt denies pain. No signs of infection observed.    Occupational performance deficits (Please refer to evaluation for details):  ADL's;IADL's;Leisure;Rest and Sleep;Social Participation;Other   productive activities, ambulation and functional mobility   Rehab Potential  Fair  OT Frequency  3x / week    OT Duration  12 weeks    OT Treatment/Interventions  Self-care/ADL training;Therapeutic exercise;DME and/or AE instruction;Therapist, nutritional;Compression bandaging;Other (comment);Manual Therapy;Patient/family education;Therapeutic activities;Manual lymph drainage;Energy conservation   skin care w/ low ph lotion and castor oil (palma christi)   Clinical Decision Making  Multiple treatment options, significant modification of task necessary    OT Home Exercise Plan  Juzo, "Dynamic", A-D, ccl 2 ( 30-40 mmHg) circular knit , elastic compression garments- open toe and silicone top band    Consulted and Agree with Plan of Care  Patient;Family member/caregiver    Family Member Consulted  spouse - Steven Meza       Patient will benefit from skilled therapeutic intervention in order to improve the following deficits and impairments:  Abnormal gait, Decreased balance, Decreased endurance, Decreased mobility, Decreased skin integrity, Difficulty walking, Impaired sensation, Obesity, Pain, Impaired perceived functional ability, Decreased knowledge of precautions, Decreased range of motion, Increased edema, Decreased knowledge of use of DME, Impaired flexibility  Visit Diagnosis: Lymphedema, not elsewhere classified    Problem List There are no active problems to display for this patient.   Andrey Spearman, MS, OTR/L, [facility] 03/20/18 4:10 PM  [place] MAIN [facility] SERVICES [address] [place], [place], [address] Phone: [phone]   Fax:  [phone]  Name: Zurich Carreno. MRN:  [id number] Date of Birth: Feb 01, 1931

## 2018-03-21 ENCOUNTER — Ambulatory Visit: Payer: Medicare Other | Admitting: Occupational Therapy

## 2018-03-21 DIAGNOSIS — I890 Lymphedema, not elsewhere classified: Secondary | ICD-10-CM | POA: Diagnosis not present

## 2018-03-22 NOTE — Therapy (Signed)
[place] MAIN [facility] SERVICES [address] [place], [place], [address] Phone: [phone]   Fax:  [phone]  Occupational Therapy Treatment  Patient Details  Name: Steven Meza. MRN: [id number] Date of Birth: 02-27-1931 Referring Provider (OT): Felipa Furnace, MD   Encounter Date: 03/21/2018  OT End of Session - 03/21/18 1057    Visit Number  16    Number of Visits  36    Date for OT Re-Evaluation  05/14/18    OT Start Time  0105    OT Stop Time  0205    OT Time Calculation (min)  60 min    Activity Tolerance  Patient tolerated treatment well;No increased pain    Behavior During Therapy  WFL for tasks assessed/performed       Past Medical History:  Diagnosis Date  . Cellulitis   . HBP (high blood pressure)   . Hearing loss   . Sleep apnea   . Stroke [facility])     Past Surgical History:  Procedure Laterality Date  . BLADDER SURGERY    . KNEE SURGERY Right     There were no vitals filed for this visit.  Subjective Assessment - 03/21/18 1050    Subjective   Mr Tousley arrives for OT rx visit 17/36 to address BLE lymphedema. Pt is accompanied by his wife, [name]. Spouse and Pt report that he was able to don compression knee highs this morning himself using carpeted floor for friction mat and using extra time. Pt stated he was not able to take stockings off at night without maximum assistance.    Patient is accompained by:  Family member    Pertinent History  Conditions contributing to BLE lymphedema include morbid obesity, (BMI 42.13 kg/m2) OA, OSA, (declines CPap) HTN, venous stasis and recurrent cellulitis    Limitations  difficulty walking, impaired transfers and functional mobility, impaired sleep, HOH (declines hearing aids) , chronic leg swelling and associated pain, impaired LB dressing and bathing, decreased AROM at feet, ankles, knees 2/2 body habitus and girth, impaired standing and walking balance with 3 falls in last 3  months,      Patient Stated Goals  stop he lymphorrhea, get the swelling down and keep it from getting worse.    Currently in Pain?  No/denies    Pain Onset  --   > 2 years                  OT Treatments/Exercises (OP) - 03/22/18 0001      ADLs   ADL Education Given  Yes      Manual Therapy   Manual Therapy  Edema management;Manual Lymphatic Drainage (MLD);Compression Bandaging    Manual therapy comments  Pt to see dermatologist re progressive skin lesion on L foot and ankle later this week.    Manual Lymphatic Drainage (MLD)  MLD to RLE today using functional pathways, includng short neck sequence, deep abdominals, functional inguinal LN and full leg sequence.     Compression Bandaging  Applied compression garments bilaterally after Rx  to ensure energy conservation and due to time comnstraints.             OT Education - 03/21/18 1055    Education Details  Cont Pt and family edu for donning and doffing compression garments using assistive devidces. Provided Dycem   to modify mouse pad   to make a home made friction mat for donning and doffing.    Person(s)  Educated  Patient;Spouse    Methods  Explanation;Demonstration;Handout;Verbal cues    Comprehension  Verbalized understanding;Returned demonstration;Verbal cues required          OT Long Term Goals - 03/07/18 1459      OT LONG TERM GOAL #1   Title  Pt will demonstrate understanding of lymphedema (LE) precautions / prevention principals, including signs / symptoms of cellulitis infection with modified independence using LE Workbook as printed reference to identify 6 precautions without verbal cues by end of 6th  OT Rx visit.     Baseline  Max A    Time  6    Period  Weeks    Status  Achieved      OT LONG TERM GOAL #2   Title  Pt will be able to apply multi-layer short stretch compression wraps using correct gradient techniques with Max CG assistance to achieve optimal limb volume reduction during  Intensive Phase CDT, and to return affected limb(s) , as closely as possible, to premorbid size and shape.    Baseline  Dependent    Time  6    Status  Achieved      OT LONG TERM GOAL #3   Title  Pt to achieve no less than 10% limb volume reduction in affected limb(s) during Intensive Phase CDT to control limb swelling, to improve tissue integrity and immune function, to improve ADLs performance and to improve functional mobility/ transfers.    Baseline  Max A 03/07/2018:ACHIEVED FOR lle    Time  12    Period  Weeks      OT LONG TERM GOAL #4   Title  With moderate  CG assistance Pt will achieve 100% compliance with daily LE self-care home program components, including proper skin care, simple self-MLD, gradient compression wraps/ garments, and therapeutic exercise to ensure optimal Intensive Phase limb volume reduction to expedite compression garment/ device fitting.    Baseline  Max A    Time  12    Period  Weeks    Status  Achieved      OT LONG TERM GOAL #5   Title  Pt will demonstrate competent use of assistive devices during LE self-care training to improve ability to lift legs onto treatment table, to don and doff shoes and socks, and to don and doff compression garments/devices with Max  assistance to ensure optimal LE self-management over time.    Baseline  Dependent    Time  12    Period  Weeks    Status  Achieved      OT LONG TERM GOAL #6   Title  Pt will retain optimal limb volume reductions achieved during Intensive Phase CDT with no more than 3% volume increase with ongoing CG assistance to limit LE progression and further functional decline.    Baseline  Dependent    Time  6    Period  Months    Status  New            Plan - 03/22/18 1057    Clinical Impression Statement  Cont Pt and family edu for donning and doffing elastc compression garemtns using assistive devices. Provided Dycem to modify existing mouse pad  to make friction matt. Provided custom fabricated  Tyveck   slipper designed to assist w/ doffing.  Provided RLE MLD and skin care for remainder of session. Provided max A to don stockings after session to save time.           Occupational  performance deficits (Please refer to evaluation for details):  ADL's;IADL's;Leisure;Rest and Sleep;Social Participation;Other   productive activities, ambulation and functional mobility   Rehab Potential  Fair    OT Frequency  3x / week    OT Duration  12 weeks    OT Treatment/Interventions  Self-care/ADL training;Therapeutic exercise;DME and/or AE instruction;Therapist, nutritional;Compression bandaging;Other (comment);Manual Therapy;Patient/family education;Therapeutic activities;Manual lymph drainage;Energy conservation   skin care w/ low ph lotion and castor oil (palma christi)   Clinical Decision Making  Multiple treatment options, significant modification of task necessary    OT Home Exercise Plan  Juzo, "Dynamic", A-D, ccl 2 ( 30-40 mmHg) circular knit , elastic compression garments- open toe and silicone top band    Consulted and Agree with Plan of Care  Patient;Family member/caregiver    Family Member Consulted  spouse - [name]       Patient will benefit from skilled therapeutic intervention in order to improve the following deficits and impairments:  Abnormal gait, Decreased balance, Decreased endurance, Decreased mobility, Decreased skin integrity, Difficulty walking, Impaired sensation, Obesity, Pain, Impaired perceived functional ability, Decreased knowledge of precautions, Decreased range of motion, Increased edema, Decreased knowledge of use of DME, Impaired flexibility  Visit Diagnosis: Lymphedema, not elsewhere classified    Problem List There are no active problems to display for this patient.   Andrey Spearman, MS, OTR/L, [facility] 03/22/18 11:02 AM   [place] MAIN [facility] SERVICES [address] [place], [place], [address] Phone:  [phone]   Fax:  [phone]  Name: Ojas Coone. MRN: [id number] Date of Birth: 18-May-1930

## 2018-03-23 ENCOUNTER — Ambulatory Visit: Payer: Medicare Other | Admitting: Occupational Therapy

## 2018-03-23 DIAGNOSIS — I890 Lymphedema, not elsewhere classified: Secondary | ICD-10-CM

## 2018-03-23 NOTE — Therapy (Signed)
[facility] [facility] MAIN REHAB SERVICES [address] [place], [place], [address] Phone: [phone]   Fax:  [phone]  Occupational Therapy Treatment  Patient Details  Name: Steven Meza. MRN: [id number] Date of Birth: 06/26/1930 Referring Provider (OT): Jeffery Sparks, MD   Encounter Date: 03/23/2018  OT End of Session - 03/23/18 1723    Visit Number  17    Number of Visits  36    Date for OT Re-Evaluation  05/14/18    OT Start Time  0100    OT Stop Time  0215    OT Time Calculation (min)  75 min    Activity Tolerance  Patient tolerated treatment well;No increased pain    Behavior During Therapy  WFL for tasks assessed/performed       Past Medical History:  Diagnosis Date  . Cellulitis   . HBP (high blood pressure)   . Hearing loss   . Sleep apnea   . Stroke ([facility])     Past Surgical History:  Procedure Laterality Date  . BLADDER SURGERY    . KNEE SURGERY Right     There were no vitals filed for this visit.  Subjective Assessment - 03/23/18 1719    Subjective   Steven Meza arrives for OT rx visit 18/36 to address BLE lymphedema. Pt is accompanied by his wife, [name]. Spouse and Pt report that he was able to don compression knee highs this morning himself using carpeted floor for friction mat and using extra time. Pt stated he was not able to take stockings off at night without maximum assistance.    Patient is accompained by:  Family member    Pertinent History  Conditions contributing to BLE lymphedema include morbid obesity, (BMI 42.13 kg/m2) OA, OSA, (declines CPap) HTN, venous stasis and recurrent cellulitis    Limitations  difficulty walking, impaired transfers and functional mobility, impaired sleep, HOH (declines hearing aids) , chronic leg swelling and associated pain, impaired LB dressing and bathing, decreased AROM at feet, ankles, knees 2/2 body habitus and girth, impaired standing and walking balance with 3 falls in last 3  months,      Patient Stated Goals  stop he lymphorrhea, get the swelling down and keep it from getting worse.    Pain Onset  --   > 2 years         LYMPHEDEMA/ONCOLOGY QUESTIONNAIRE - 03/23/18 1724      Left Lower Extremity Lymphedema   Other  LLE limb volume for leg from ankle to below knee (A-D) measures 5144.82 ml. Limb volume since initially measured on 03/02/18 is increased by 3.49% in LLE.                OT Treatments/Exercises (OP) - 03/23/18 0001      ADLs   ADL Education Given  Yes      Manual Therapy   Manual Therapy  Edema management;Manual Lymphatic Drainage (MLD);Compression Bandaging    Manual therapy comments  BLE comparative limb volumetrics    Edema Management  skin care to BLE as established    Compression Bandaging  Applied compression garments bilaterally after Rx  to ensure energy conservation and due to time comnstraints.             OT Education - 03/23/18 1723    Education Details  Continued skilled Pt/caregiver education  And LE ADL training throughout visit for lymphedema self care/ home program, including compression wrapping, compression garment and device wear/care,   lymphatic pumping ther ex, simple self-MLD, and skin care. Discussed progress towards goals.     Person(s) Educated  Patient;Spouse    Methods  Explanation;Demonstration;Handout;Verbal cues    Comprehension  Verbalized understanding;Returned demonstration;Verbal cues required          OT Long Term Goals - 03/07/18 1459      OT LONG TERM GOAL #1   Title  Pt will demonstrate understanding of lymphedema (LE) precautions / prevention principals, including signs / symptoms of cellulitis infection with modified independence using LE Workbook as printed reference to identify 6 precautions without verbal cues by end of 6th  OT Rx visit.     Baseline  Max A    Time  6    Period  Weeks    Status  Achieved      OT LONG TERM GOAL #2   Title  Pt will be able to apply  multi-layer short stretch compression wraps using correct gradient techniques with Max CG assistance to achieve optimal limb volume reduction during Intensive Phase CDT, and to return affected limb(s) , as closely as possible, to premorbid size and shape.    Baseline  Dependent    Time  6    Status  Achieved      OT LONG TERM GOAL #3   Title  Pt to achieve no less than 10% limb volume reduction in affected limb(s) during Intensive Phase CDT to control limb swelling, to improve tissue integrity and immune function, to improve ADLs performance and to improve functional mobility/ transfers.    Baseline  Max A 03/07/2018:ACHIEVED FOR lle    Time  12    Period  Weeks      OT LONG TERM GOAL #4   Title  With moderate  CG assistance Pt will achieve 100% compliance with daily LE self-care home program components, including proper skin care, simple self-MLD, gradient compression wraps/ garments, and therapeutic exercise to ensure optimal Intensive Phase limb volume reduction to expedite compression garment/ device fitting.    Baseline  Max A    Time  12    Period  Weeks    Status  Achieved      OT LONG TERM GOAL #5   Title  Pt will demonstrate competent use of assistive devices during LE self-care training to improve ability to lift legs onto treatment table, to don and doff shoes and socks, and to don and doff compression garments/devices with Max  assistance to ensure optimal LE self-management over time.    Baseline  Dependent    Time  12    Period  Weeks    Status  Achieved      OT LONG TERM GOAL #6   Title  Pt will retain optimal limb volume reductions achieved during Intensive Phase CDT with no more than 3% volume increase with ongoing CG assistance to limit LE progression and further functional decline.    Baseline  Dependent    Time  6    Period  Months    Status  New            Plan - 03/23/18 1724    Clinical Impression Statement  Completed BLE comparative luimb volumetrics to  measure goal progress and to determine if OTS  compression garments are containing swelling. RLE limb volume for leg section from ankle to popliteal fossa measures 3498.42 ml. Limb volume reduction for RLE measures 23.01%. Goal met. LLE limb volume for leg from ankle to below knee (A-D)  measures 5144.82 ml. Limb volume since initially measured on 03/02/18 is increased by 3.49% in LLE.      Occupational performance deficits (Please refer to evaluation for details):  ADL's;IADL's;Leisure;Rest and Sleep;Social Participation;Other   productive activities, ambulation and functional mobility   Rehab Potential  Fair    OT Frequency  3x / week    OT Duration  12 weeks    OT Treatment/Interventions  Self-care/ADL training;Therapeutic exercise;DME and/or AE instruction;Therapist, nutritional;Compression bandaging;Other (comment);Manual Therapy;Patient/family education;Therapeutic activities;Manual lymph drainage;Energy conservation   skin care w/ low ph lotion and castor oil (palma christi)   Clinical Decision Making  Multiple treatment options, significant modification of task necessary    OT Home Exercise Plan  Juzo, "Dynamic", A-D, ccl 2 ( 30-40 mmHg) circular knit , elastic compression garments- open toe and silicone top band    Consulted and Agree with Plan of Care  Patient;Family member/caregiver    Family Member Consulted  spouse - [name]       Patient will benefit from skilled therapeutic intervention in order to improve the following deficits and impairments:  Abnormal gait, Decreased balance, Decreased endurance, Decreased mobility, Decreased skin integrity, Difficulty walking, Impaired sensation, Obesity, Pain, Impaired perceived functional ability, Decreased knowledge of precautions, Decreased range of motion, Increased edema, Decreased knowledge of use of DME, Impaired flexibility  Visit Diagnosis: Lymphedema, not elsewhere classified    Problem List There are no active problems to  display for this patient.   Andrey Spearman, MS, OTR/L, [facility] 03/23/18 5:31 PM  [place] MAIN [facility] SERVICES [address] [place], [place], [address] Phone: [phone]   Fax:  [phone]  Name: Steven Meza. MRN: [id number] Date of Birth: [birth year]

## 2018-03-27 ENCOUNTER — Ambulatory Visit: Payer: Medicare Other | Admitting: Occupational Therapy

## 2018-03-27 DIAGNOSIS — I890 Lymphedema, not elsewhere classified: Secondary | ICD-10-CM

## 2018-03-28 ENCOUNTER — Ambulatory Visit: Payer: Medicare Other | Admitting: Occupational Therapy

## 2018-03-28 DIAGNOSIS — I890 Lymphedema, not elsewhere classified: Secondary | ICD-10-CM | POA: Diagnosis not present

## 2018-03-28 NOTE — Therapy (Signed)
[place] MAIN [facility] SERVICES [address] [place], [place], [address] Phone: [phone]   Fax:  [phone]  Occupational Therapy Treatment  Patient Details  Name: Steven Meza. MRN: [id number] Date of Birth: 1931/03/24 Referring Provider (OT): Felipa Furnace, MD   Encounter Date: 03/28/2018  OT End of Session - 03/28/18 1727    Visit Number  19    Number of Visits  36    Date for OT Re-Evaluation  05/14/18    OT Start Time  0100    OT Stop Time  0217    OT Time Calculation (min)  77 min    Activity Tolerance  Patient tolerated treatment well;No increased pain    Behavior During Therapy  WFL for tasks assessed/performed       Past Medical History:  Diagnosis Date  . Cellulitis   . HBP (high blood pressure)   . Hearing loss   . Sleep apnea   . Stroke [facility])     Past Surgical History:  Procedure Laterality Date  . BLADDER SURGERY    . KNEE SURGERY Right     There were no vitals filed for this visit.  Subjective Assessment - 03/28/18 1725    Subjective   Steven Meza arrives for OT rx visit 19/36 to address BLE lymphedema. Pt is accompanied by his wife, Steven Meza. Pt reports that he had no difficulty tolerating compression wraps over night last night.    Patient is accompained by:  Family member    Pertinent History  Conditions contributing to BLE lymphedema include morbid obesity, (BMI 42.13 kg/m2) OA, OSA, (declines CPap) HTN, venous stasis and recurrent cellulitis    Limitations  difficulty walking, impaired transfers and functional mobility, impaired sleep, HOH (declines hearing aids) , chronic leg swelling and associated pain, impaired LB dressing and bathing, decreased AROM at feet, ankles, knees 2/2 body habitus and girth, impaired standing and walking balance with 3 falls in last 3 months,      Patient Stated Goals  stop he lymphorrhea, get the swelling down and keep it from getting worse.    Currently in Pain?  No/denies     Pain Onset  --   > 2 years                  OT Treatments/Exercises (OP) - 03/28/18 1726      ADLs   ADL Education Given  Yes      Manual Therapy   Manual Therapy  Edema management;Manual Lymphatic Drainage (MLD)    Edema Management  skin care to BLE as established    Manual Lymphatic Drainage (MLD)  MLD to RLE today using functional pathways, includng short neck sequence, deep abdominals, functional inguinal LN and full leg sequence.     Compression Bandaging  wNo compression knee highs today.  Resume comrpession wrapping, but wraps unavailable. Spouse will apply bilaterally once at home after session             OT Education - 03/28/18 1727    Education Details  Continued skilled Pt/caregiver education  And LE ADL training throughout visit for lymphedema self care/ home program, including compression wrapping, compression garment and device wear/care, lymphatic pumping ther ex, simple self-MLD, and skin care. Discussed progress towards goals.     Person(s) Educated  Patient;Spouse    Methods  Explanation;Demonstration;Handout;Verbal cues    Comprehension  Verbalized understanding;Returned demonstration;Verbal cues required          OT  Long Term Goals - 03/07/18 1459      OT LONG TERM GOAL #1   Title  Pt will demonstrate understanding of lymphedema (LE) precautions / prevention principals, including signs / symptoms of cellulitis infection with modified independence using LE Workbook as printed reference to identify 6 precautions without verbal cues by end of 6th  OT Rx visit.     Baseline  Max A    Time  6    Period  Weeks    Status  Achieved      OT LONG TERM GOAL #2   Title  Pt will be able to apply multi-layer short stretch compression wraps using correct gradient techniques with Max CG assistance to achieve optimal limb volume reduction during Intensive Phase CDT, and to return affected limb(s) , as closely as possible, to premorbid size and shape.     Baseline  Dependent    Time  6    Status  Achieved      OT LONG TERM GOAL #3   Title  Pt to achieve no less than 10% limb volume reduction in affected limb(s) during Intensive Phase CDT to control limb swelling, to improve tissue integrity and immune function, to improve ADLs performance and to improve functional mobility/ transfers.    Baseline  Max A 03/07/2018:ACHIEVED FOR lle    Time  12    Period  Weeks      OT LONG TERM GOAL #4   Title  With moderate  CG assistance Pt will achieve 100% compliance with daily LE self-care home program components, including proper skin care, simple self-MLD, gradient compression wraps/ garments, and therapeutic exercise to ensure optimal Intensive Phase limb volume reduction to expedite compression garment/ device fitting.    Baseline  Max A    Time  12    Period  Weeks    Status  Achieved      OT LONG TERM GOAL #5   Title  Pt will demonstrate competent use of assistive devices during LE self-care training to improve ability to lift legs onto treatment table, to don and doff shoes and socks, and to don and doff compression garments/devices with Max  assistance to ensure optimal LE self-management over time.    Baseline  Dependent    Time  12    Period  Weeks    Status  Achieved      OT LONG TERM GOAL #6   Title  Pt will retain optimal limb volume reductions achieved during Intensive Phase CDT with no more than 3% volume increase with ongoing CG assistance to limit LE progression and further functional decline.    Baseline  Dependent    Time  6    Period  Months    Status  New            Plan - 03/28/18 1727    Clinical Impression Statement  BLE limb volume and skin condition are improved with compression wraps over night. Slight lymphorrhea observed at medial L leg wound. Pt tolerated MLD , skin care and bilateral compression wraps without difficulty in clinic today. Pt will contnue to wrap with spouse's assistance until after the holiday  when we will contiinue to discuss custom compression garment recmmendations and plan.     Occupational performance deficits (Please refer to evaluation for details):  ADL's;IADL's;Leisure;Rest and Sleep;Social Participation;Other   productive activities, ambulation and functional mobility   Rehab Potential  Fair    OT Frequency  3x / week  OT Duration  12 weeks    OT Treatment/Interventions  Self-care/ADL training;Therapeutic exercise;DME and/or AE instruction;Therapist, nutritional;Compression bandaging;Other (comment);Manual Therapy;Patient/family education;Therapeutic activities;Manual lymph drainage;Energy conservation   skin care w/ low ph lotion and castor oil (palma christi)   Clinical Decision Making  Multiple treatment options, significant modification of task necessary    OT Home Exercise Plan  Juzo, "Dynamic", A-D, ccl 2 ( 30-40 mmHg) circular knit , elastic compression garments- open toe and silicone top band    Consulted and Agree with Plan of Care  Patient;Family member/caregiver    Family Member Consulted  spouse - Steven Meza       Patient will benefit from skilled therapeutic intervention in order to improve the following deficits and impairments:  Abnormal gait, Decreased balance, Decreased endurance, Decreased mobility, Decreased skin integrity, Difficulty walking, Impaired sensation, Obesity, Pain, Impaired perceived functional ability, Decreased knowledge of precautions, Decreased range of motion, Increased edema, Decreased knowledge of use of DME, Impaired flexibility  Visit Diagnosis: Lymphedema, not elsewhere classified    Problem List There are no active problems to display for this patient.   Andrey Spearman, MS, OTR/L, [facility] 03/28/18 5:31 PM  [place] MAIN [facility] SERVICES [address] [place], [place], [address] Phone: [phone]   Fax:  [phone]  Name: Steven Meza. MRN: [id number] Date of Birth:  1930/12/16

## 2018-03-28 NOTE — Therapy (Signed)
[place] MAIN [facility] SERVICES [address] [place], [place], [address] Phone: [phone]   Fax:  [phone]  Occupational Therapy Treatment  Patient Details  Name: Shaurya Rawdon. MRN: [id number] Date of Birth: July 28, 1930 Referring Provider (OT): Felipa Furnace, MD   Encounter Date: 03/27/2018  OT End of Session - 03/28/18 1207    Visit Number  18    Number of Visits  36    Date for OT Re-Evaluation  05/14/18    OT Start Time  0100    OT Stop Time  0206    OT Time Calculation (min)  66 min    Activity Tolerance  Patient tolerated treatment well;No increased pain    Behavior During Therapy  WFL for tasks assessed/performed       Past Medical History:  Diagnosis Date  . Cellulitis   . HBP (high blood pressure)   . Hearing loss   . Sleep apnea   . Stroke [name])     Past Surgical History:  Procedure Laterality Date  . BLADDER SURGERY    . KNEE SURGERY Right     There were no vitals filed for this visit.  Subjective Assessment - 03/27/18 1204    Subjective   Mr Gunnels arrives for OT rx visit 18/36 to address BLE lymphedema. Pt is accompanied by his wife, [name]. [name] reports Pt has new open area on RLE.  Pt reports [name] is assisting him with taking garments off, which is saving lots of tme.    Patient is accompained by:  Family member    Pertinent History  Conditions contributing to BLE lymphedema include morbid obesity, (BMI 42.13 kg/m2) OA, OSA, (declines CPap) HTN, venous stasis and recurrent cellulitis    Limitations  difficulty walking, impaired transfers and functional mobility, impaired sleep, HOH (declines hearing aids) , chronic leg swelling and associated pain, impaired LB dressing and bathing, decreased AROM at feet, ankles, knees 2/2 body habitus and girth, impaired standing and walking balance with 3 falls in last 3 months,      Patient Stated Goals  stop he lymphorrhea, get the swelling down and keep it from getting  worse.    Currently in Pain?  No/denies    Pain Onset  --   > 2 years                  OT Treatments/Exercises (OP) - 03/28/18 0001      ADLs   ADL Education Given  Yes      Manual Therapy   Manual Therapy  Edema management;Manual Lymphatic Drainage (MLD)    Edema Management  skin care to BLE as established    Manual Lymphatic Drainage (MLD)  MLD to RLE today using functional pathways, includng short neck sequence, deep abdominals, functional inguinal LN and full leg sequence.     Compression Bandaging  wNo compression knee highs today.  Resume comrpession wrapping, but wraps unavailable. Spouse will apply bilaterally once at home after session             OT Education - 03/27/18 1207    Education Details  Continued skilled Pt/caregiver education  And LE ADL training throughout visit for lymphedema self care/ home program, including compression wrapping, compression garment and device wear/care, lymphatic pumping ther ex, simple self-MLD, and skin care. Discussed progress towards goals.     Person(s) Educated  Patient;Spouse    Methods  Explanation;Demonstration;Handout;Verbal cues    Comprehension  Verbalized understanding;Returned demonstration;Verbal  cues required          OT Long Term Goals - 03/07/18 1459      OT LONG TERM GOAL #1   Title  Pt will demonstrate understanding of lymphedema (LE) precautions / prevention principals, including signs / symptoms of cellulitis infection with modified independence using LE Workbook as printed reference to identify 6 precautions without verbal cues by end of 6th  OT Rx visit.     Baseline  Max A    Time  6    Period  Weeks    Status  Achieved      OT LONG TERM GOAL #2   Title  Pt will be able to apply multi-layer short stretch compression wraps using correct gradient techniques with Max CG assistance to achieve optimal limb volume reduction during Intensive Phase CDT, and to return affected limb(s) , as closely as  possible, to premorbid size and shape.    Baseline  Dependent    Time  6    Status  Achieved      OT LONG TERM GOAL #3   Title  Pt to achieve no less than 10% limb volume reduction in affected limb(s) during Intensive Phase CDT to control limb swelling, to improve tissue integrity and immune function, to improve ADLs performance and to improve functional mobility/ transfers.    Baseline  Max A 03/07/2018:ACHIEVED FOR lle    Time  12    Period  Weeks      OT LONG TERM GOAL #4   Title  With moderate  CG assistance Pt will achieve 100% compliance with daily LE self-care home program components, including proper skin care, simple self-MLD, gradient compression wraps/ garments, and therapeutic exercise to ensure optimal Intensive Phase limb volume reduction to expedite compression garment/ device fitting.    Baseline  Max A    Time  12    Period  Weeks    Status  Achieved      OT LONG TERM GOAL #5   Title  Pt will demonstrate competent use of assistive devices during LE self-care training to improve ability to lift legs onto treatment table, to don and doff shoes and socks, and to don and doff compression garments/devices with Max  assistance to ensure optimal LE self-management over time.    Baseline  Dependent    Time  12    Period  Weeks    Status  Achieved      OT LONG TERM GOAL #6   Title  Pt will retain optimal limb volume reductions achieved during Intensive Phase CDT with no more than 3% volume increase with ongoing CG assistance to limit LE progression and further functional decline.    Baseline  Dependent    Time  6    Period  Months    Status  New            Plan - 03/28/18 1208    Clinical Impression Statement  BLE leg swelling and redness are notably increased today. New open area on anteromedial R leg is observed, and open area  on posterior lateral L leg is also more open due to increased interstitial pressure. Increased cobblestoning ( lymphatic cysts) are observed  bilaterally at distal legs as well. Skin temp is WNL. Slght lymphorrhea observed at R leg wound. Pt instructed to DC OTS compression stockings and resume compression wrapping. It's likely that skin issues are result of Pt  lengthy and labored donning and doffing techniques a hotme.  Increased swelling bilaterally appears to be due to poor containment by OTS, ccl 2 circular knit garments. Also suspect possible "tourniquet effect" at uper stocki9ng bnorder  constriction lymph flow  proximally and causing increased swelling distally. Pt and spouse remain somewhat resistant to initial recommendation for custom, flat knit garment due to high cost, but agree with plan to try one   custom garment initially. Wil complete new  anatomical leasurements once limb volume have decreased again.     Occupational performance deficits (Please refer to evaluation for details):  ADL's;IADL's;Leisure;Rest and Sleep;Social Participation;Other   productive activities, ambulation and functional mobility   Rehab Potential  Fair    OT Frequency  3x / week    OT Duration  12 weeks    OT Treatment/Interventions  Self-care/ADL training;Therapeutic exercise;DME and/or AE instruction;Therapist, nutritional;Compression bandaging;Other (comment);Manual Therapy;Patient/family education;Therapeutic activities;Manual lymph drainage;Energy conservation   skin care w/ low ph lotion and castor oil (palma christi)   Clinical Decision Making  Multiple treatment options, significant modification of task necessary    OT Home Exercise Plan  Juzo, "Dynamic", A-D, ccl 2 ( 30-40 mmHg) circular knit , elastic compression garments- open toe and silicone top band    Consulted and Agree with Plan of Care  Patient;Family member/caregiver    Family Member Consulted  spouse - [name]       Patient will benefit from skilled therapeutic intervention in order to improve the following deficits and impairments:  Abnormal gait, Decreased balance, Decreased  endurance, Decreased mobility, Decreased skin integrity, Difficulty walking, Impaired sensation, Obesity, Pain, Impaired perceived functional ability, Decreased knowledge of precautions, Decreased range of motion, Increased edema, Decreased knowledge of use of DME, Impaired flexibility  Visit Diagnosis: Lymphedema, not elsewhere classified    Problem List There are no active problems to display for this patient.   Andrey Spearman, MS, OTR/L, [facility] 03/28/18 12:19 PM  [place] MAIN [facility] SERVICES [address] [place], [place], [address] Phone: [phone]   Fax:  [phone]  Name: Tres Grzywacz. MRN: [id number] Date of Birth: August 01, 1930

## 2018-04-03 ENCOUNTER — Ambulatory Visit: Payer: Medicare Other | Attending: Internal Medicine | Admitting: Occupational Therapy

## 2018-04-03 DIAGNOSIS — I890 Lymphedema, not elsewhere classified: Secondary | ICD-10-CM | POA: Insufficient documentation

## 2018-04-03 NOTE — Therapy (Addendum)
[place] MAIN [facility] SERVICES [address] [place], [place], [address] Phone: [phone]   Fax:  [phone]  Occupational Therapy Treatment Note and Progress Report: BLE Lymphedema Care  Patient Details  Name: Steven Meza. MRN: [id number] Date of Birth: February 25, 1931 Referring Provider (OT): Felipa Furnace, MD   Encounter Date: 04/03/2018  OT End of Session - 04/03/18 1624    Visit Number  20    Number of Visits  36    Date for OT Re-Evaluation  05/14/18    OT Start Time  0103    OT Stop Time  0215    OT Time Calculation (min)  72 min    Activity Tolerance  Patient tolerated treatment well;No increased pain    Behavior During Therapy  WFL for tasks assessed/performed       Past Medical History:  Diagnosis Date  . Cellulitis   . HBP (high blood pressure)   . Hearing loss   . Sleep apnea   . Stroke [facility])     Past Surgical History:  Procedure Laterality Date  . BLADDER SURGERY    . KNEE SURGERY Right     There were no vitals filed for this visit.  Subjective Assessment - 04/03/18 1617    Subjective   Mr Dogan arrives for OT rx visit 20/36 to address BLE lymphedema. Pt is accompanied by his wife, Steven Meza, who reports L leg wounds appear to be getting worse. "I put some medicine on them before I wrapped them."    Patient is accompained by:  Family member    Pertinent History  Conditions contributing to BLE lymphedema include morbid obesity, (BMI 42.13 kg/m2) OA, OSA, (declines CPap) HTN, venous stasis and recurrent cellulitis    Limitations  difficulty walking, impaired transfers and functional mobility, impaired sleep, HOH (declines hearing aids) , chronic leg swelling and associated pain, impaired LB dressing and bathing, decreased AROM at feet, ankles, knees 2/2 body habitus and girth, impaired standing and walking balance with 3 falls in last 3 months,      Patient Stated Goals  stop he lymphorrhea, get the swelling down and  keep it from getting worse.    Currently in Pain?  Yes   reports pain in ankles at night with feet elevated. not rated numerically   Pain Onset  --   > 2 years                  OT Treatments/Exercises (OP) - 04/03/18 0001      ADLs   ADL Education Given  Yes      Manual Therapy   Manual Therapy  Edema management;Manual Lymphatic Drainage (MLD)    Edema Management  skin care to LLE  below knee during MLD uasing low ph, skin grade castor oil    Manual Lymphatic Drainage (MLD)  MLD to LLE  using functional pathways, includng short neck sequence, deep abdominals, functional inguinal LN and full leg sequence.  Care taken in area of LLE wounds to limit any damage to heling tissue.    Compression Bandaging  BLE compression wraps w short stretch bandages  fom foot to politeal fossa. Reduced LLE wraps from 3 to 1, removing smallest 8 cm bandage as it is most compressive at narrowest part ofg leg. Added 2 rolls 15 cm wide Artiflex cast padding from leg to ankle to bump out shae of leg into more of a column shape. In keeping w/ law of [employer],  this should help to reduceincreased pressue accumulated at smallest area of circumference.              OT Education - 04/03/18 1623    Education Details  Continued skilled Pt/caregiver education  And LE ADL training throughout visit for lymphedema self care/ home program, including compression wrapping, compression garment and device wear/care, lymphatic pumping ther ex, simple self-MLD, and skin care. Discussed progress towards goals.     Person(s) Educated  Patient;Spouse    Methods  Explanation;Demonstration;Handout;Verbal cues    Comprehension  Verbalized understanding;Returned demonstration;Verbal cues required          OT Long Term Goals - 04/03/18 1624      OT LONG TERM GOAL #1   Title  Pt will demonstrate understanding of lymphedema (LE) precautions / prevention principals, including signs / symptoms of cellulitis infection  with modified independence using LE Workbook as printed reference to identify 6 precautions without verbal cues by end of 6th  OT Rx visit.     Baseline  Max A    Time  6    Period  Weeks    Status  Achieved      OT LONG TERM GOAL #2   Title  Pt will be able to apply multi-layer short stretch compression wraps using correct gradient techniques with Max CG assistance to achieve optimal limb volume reduction during Intensive Phase CDT, and to return affected limb(s) , as closely as possible, to premorbid size and shape.    Baseline  Dependent    Time  6    Status  Achieved      OT LONG TERM GOAL #3   Title  Pt to achieve no less than 10% limb volume reduction in affected limb(s) during Intensive Phase CDT to control limb swelling, to improve tissue integrity and immune function, to improve ADLs performance and to improve functional mobility/ transfers.    Baseline  Max A 03/07/2018:ACHIEVED FOR lle  04/03/2018: LLE limb volume and skin condition continue to fluctuate weekly. RLE limb volume reduction appears stable, although we have not measured it recently  due to focus of Rx on LLE continues.    Time  12    Period  Weeks    Status  Partially Met      OT LONG TERM GOAL #4   Title  With moderate  CG assistance Pt will achieve 100% compliance with daily LE self-care home program components, including proper skin care, simple self-MLD, gradient compression wraps/ garments, and therapeutic exercise to ensure optimal Intensive Phase limb volume reduction to expedite compression garment/ device fitting.    Baseline  Max A    Time  12    Period  Weeks    Status  Achieved      OT LONG TERM GOAL #5   Title  Pt will demonstrate competent use of assistive devices during LE self-care training to improve ability to lift legs onto treatment table, to don and doff shoes and socks, and to don and doff compression garments/devices with Max  assistance to ensure optimal LE self-management over time.     Baseline  Dependent    Time  12    Period  Weeks    Status  Partially Met      OT LONG TERM GOAL #6   Title  Pt will retain optimal limb volume reductions achieved during Intensive Phase CDT with no more than 3% volume increase with ongoing CG assistance to limit LE progression and  further functional decline.    Baseline  Dependent    Time  6    Period  Months    Status  Partially Met            Plan - 04/03/18 1628    Clinical Impression Statement  LLE limb volume and skin condition continue to fluctuate. dense, dark petichiae -like areas at medial L distal L and ankle appear to be result of overcompression in these areas. Reduced comnpression in distal L leg and ankle today by adding 2 rolls of artiflex  cast padding to change cone shape of leg to columnar shape and increase the wall of this narrow limb circumference to reduce pressure, in keeping with the law of [place].  Pt and spouse state today that they prefer to wrap legs than to purchase expensice , custom [employer] knee high compression stocking. We discussed at length the importance of utilizing effective compression that matches Pt's needs given careful consideration of age, lifestyle, vascular condition, skin condition, caregiver availability, safety and , mobility . Discussed need to ultimately shift from wrapping to daytime compression alternative before skin begins to show signs/ symptoms of  bandage fatique. Progress towards goals for LLE is slowing as skin condition worsens and wounds have not improved. Spouse agrees with plan to call referring physician to request referral for a vasular consult . Cont with lighter compression to LLE and carefully monitor skin condition.     Occupational performance deficits (Please refer to evaluation for details):  ADL's;IADL's;Leisure;Rest and Sleep;Social Participation;Other   productive activities, ambulation and functional mobility   Rehab Potential  Fair    OT Frequency  3x / week     OT Duration  12 weeks    OT Treatment/Interventions  Self-care/ADL training;Therapeutic exercise;DME and/or AE instruction;Therapist, nutritional;Compression bandaging;Other (comment);Manual Therapy;Patient/family education;Therapeutic activities;Manual lymph drainage;Energy conservation   skin care w/ low ph lotion and castor oil ([place])   Clinical Decision Making  Multiple treatment options, significant modification of task necessary    OT Home Exercise Plan  Juzo, "Dynamic", A-D, ccl 2 ( 30-40 mmHg) circular knit , elastic compression garments- open toe and silicone top band    Consulted and Agree with Plan of Care  Patient;Family member/caregiver    Family Member Consulted  spouse - Steven Meza       Patient will benefit from skilled therapeutic intervention in order to improve the following deficits and impairments:  Abnormal gait, Decreased balance, Decreased endurance, Decreased mobility, Decreased skin integrity, Difficulty walking, Impaired sensation, Obesity, Pain, Impaired perceived functional ability, Decreased knowledge of precautions, Decreased range of motion, Increased edema, Decreased knowledge of use of DME, Impaired flexibility  Visit Diagnosis: Lymphedema, not elsewhere classified    Problem List There are no active problems to display for this patient.   Andrey Spearman, MS, OTR/L, [facility] 04/03/18 4:41 PM  [place] MAIN [facility] SERVICES [address] [place], [place], [address] Phone: [phone]   Fax:  [phone]  Name: Steven Meza. MRN: [id number] Date of Birth: 08-22-1930

## 2018-04-04 ENCOUNTER — Telehealth: Payer: Self-pay | Admitting: Podiatry

## 2018-04-04 ENCOUNTER — Ambulatory Visit: Payer: Medicare Other | Admitting: Occupational Therapy

## 2018-04-04 DIAGNOSIS — I890 Lymphedema, not elsewhere classified: Secondary | ICD-10-CM

## 2018-04-04 NOTE — Therapy (Signed)
[place] MAIN [facility] SERVICES [address] [place], [place], [address] Phone: [phone]   Fax:  [phone]  Occupational Therapy Treatment  Patient Details  Name: Steven Meza. MRN: [id number] Date of Birth: [birth year] Referring Provider (OT): Felipa Furnace, MD   Encounter Date: 04/04/2018  OT End of Session - 04/04/18 1554    Visit Number  21    Number of Visits  36    Date for OT Re-Evaluation  05/14/18    OT Start Time  0100    OT Stop Time  0210    OT Time Calculation (min)  70 min    Activity Tolerance  Patient tolerated treatment well;No increased pain    Behavior During Therapy  WFL for tasks assessed/performed       Past Medical History:  Diagnosis Date  . Cellulitis   . HBP (high blood pressure)   . Hearing loss   . Sleep apnea   . Stroke [facility])     Past Surgical History:  Procedure Laterality Date  . BLADDER SURGERY    . KNEE SURGERY Right     There were no vitals filed for this visit.  Subjective Assessment - 04/04/18 1552    Subjective   Steven Meza arrives for OT rx visit 21/36 to address BLE lymphedema. Pt is accompanied by his wife, Steven Meza, who reports she has left VM for referring MD re her request for vascular referral, but has not yet spoken to him. Pt reports he had no leg pain overnight and has no pain at present.    Patient is accompained by:  Family member    Pertinent History  Conditions contributing to BLE lymphedema include morbid obesity, (BMI 42.13 kg/m2) OA, OSA, (declines CPap) HTN, venous stasis and recurrent cellulitis    Limitations  difficulty walking, impaired transfers and functional mobility, impaired sleep, HOH (declines hearing aids) , chronic leg swelling and associated pain, impaired LB dressing and bathing, decreased AROM at feet, ankles, knees 2/2 body habitus and girth, impaired standing and walking balance with 3 falls in last 3 months,      Patient Stated Goals  stop he  lymphorrhea, get the swelling down and keep it from getting worse.    Currently in Pain?  No/denies    Pain Onset  --   > 2 years                  OT Treatments/Exercises (OP) - 04/04/18 0001      ADLs   ADL Education Given  Yes      Manual Therapy   Manual Therapy  Edema management;Manual Lymphatic Drainage (MLD)    Edema Management  skin care to LLE  below knee during MLD uasing low ph, skin grade castor oil    Manual Lymphatic Drainage (MLD)  MLD to LLE  using functional pathways, includng short neck sequence, deep abdominals, functional inguinal LN and full leg sequence.  Care taken in area of LLE wounds to limit any damage to heling tissue.    Compression Bandaging  BLE compression wraps w short stretch bandages  fom foot to politeal fossa. Reduced LLE wraps from 3 to 1, removing smallest 8 cm bandage as it is most compressive at narrowest part ofg leg. Added 2 rolls 15 cm wide Artiflex cast padding from leg to ankle to bump out shae of leg into more of a column shape. In keeping w/ law of [place], this should help to  reduceincreased pressue accumulated at smallest area of circumference.              OT Education - 04/04/18 1553    Education Details  Continued skilled Pt/caregiver education  And LE ADL training throughout visit for lymphedema self care/ home program, including compression wrapping, compression garment and device wear/care, lymphatic pumping ther ex, simple self-MLD, and skin care. Discussed progress towards goals.     Person(s) Educated  Patient;Spouse    Methods  Explanation;Demonstration;Handout;Verbal cues    Comprehension  Verbalized understanding;Returned demonstration;Verbal cues required          OT Long Term Goals - 04/03/18 1624      OT LONG TERM GOAL #1   Title  Pt will demonstrate understanding of lymphedema (LE) precautions / prevention principals, including signs / symptoms of cellulitis infection with modified independence using  LE Workbook as printed reference to identify 6 precautions without verbal cues by end of 6th  OT Rx visit.     Baseline  Max A    Time  6    Period  Weeks    Status  Achieved      OT LONG TERM GOAL #2   Title  Pt will be able to apply multi-layer short stretch compression wraps using correct gradient techniques with Max CG assistance to achieve optimal limb volume reduction during Intensive Phase CDT, and to return affected limb(s) , as closely as possible, to premorbid size and shape.    Baseline  Dependent    Time  6    Status  Achieved      OT LONG TERM GOAL #3   Title  Pt to achieve no less than 10% limb volume reduction in affected limb(s) during Intensive Phase CDT to control limb swelling, to improve tissue integrity and immune function, to improve ADLs performance and to improve functional mobility/ transfers.    Baseline  Max A 03/07/2018:ACHIEVED FOR lle  04/03/2018: LLE limb volume and skin condition continue to fluctuate weekly. RLE limb volume reduction appears stable, although we have not measured it recently  due to focus of Rx on LLE continues.    Time  12    Period  Weeks    Status  Partially Met      OT LONG TERM GOAL #4   Title  With moderate  CG assistance Pt will achieve 100% compliance with daily LE self-care home program components, including proper skin care, simple self-MLD, gradient compression wraps/ garments, and therapeutic exercise to ensure optimal Intensive Phase limb volume reduction to expedite compression garment/ device fitting.    Baseline  Max A    Time  12    Period  Weeks    Status  Achieved      OT LONG TERM GOAL #5   Title  Pt will demonstrate competent use of assistive devices during LE self-care training to improve ability to lift legs onto treatment table, to don and doff shoes and socks, and to don and doff compression garments/devices with Max  assistance to ensure optimal LE self-management over time.    Baseline  Dependent    Time  12     Period  Weeks    Status  Partially Met      OT LONG TERM GOAL #6   Title  Pt will retain optimal limb volume reductions achieved during Intensive Phase CDT with no more than 3% volume increase with ongoing CG assistance to limit LE progression and further functional decline.  Baseline  Dependent    Time  6    Period  Months    Status  Partially Met            Plan - 04/04/18 1554    Clinical Impression Statement  BLE responded well to additionof cast padding (Artiflex) at ankles and distal legs to reduce suspected over compression. Pain at distal legs and ankles is resolved, lymphorrhea at L leg wounds has resolved and deep dusky color indicating possible hemoraging in skin is lighter, as is redness today. Provided skin care and gentle MLD as established to LLE only in clinic. Reapplied B wraps usinga rtiflex agan today and discussed plans going forward for xmas holiday break. Pt and wife state again today that prefer to continue wrapping legs daily to reduce cost of custom garments dispite recommendations. Cont as per POC.    Occupational performance deficits (Please refer to evaluation for details):  ADL's;IADL's;Leisure;Rest and Sleep;Social Participation;Other   productive activities, ambulation and functional mobility   Rehab Potential  Fair    OT Frequency  3x / week    OT Duration  12 weeks    OT Treatment/Interventions  Self-care/ADL training;Therapeutic exercise;DME and/or AE instruction;Therapist, nutritional;Compression bandaging;Other (comment);Manual Therapy;Patient/family education;Therapeutic activities;Manual lymph drainage;Energy conservation   skin care w/ low ph lotion and castor oil ([place])   Clinical Decision Making  Multiple treatment options, significant modification of task necessary    OT Home Exercise Plan  Juzo, "Dynamic", A-D, ccl 2 ( 30-40 mmHg) circular knit , elastic compression garments- open toe and silicone top band    Consulted and Agree  with Plan of Care  Patient;Family member/caregiver    Family Member Consulted  spouse - Steven Meza       Patient will benefit from skilled therapeutic intervention in order to improve the following deficits and impairments:  Abnormal gait, Decreased balance, Decreased endurance, Decreased mobility, Decreased skin integrity, Difficulty walking, Impaired sensation, Obesity, Pain, Impaired perceived functional ability, Decreased knowledge of precautions, Decreased range of motion, Increased edema, Decreased knowledge of use of DME, Impaired flexibility  Visit Diagnosis: Lymphedema, not elsewhere classified    Problem List There are no active problems to display for this patient.   Andrey Spearman, MS, OTR/L, [facility] 04/04/18 4:01 PM   [place] MAIN [facility] SERVICES [address] [place], [place], [address] Phone: [phone]   Fax:  [phone]  Name: Steven Meza. MRN: [id number] Date of Birth: 04-19-1931

## 2018-04-04 NOTE — Telephone Encounter (Signed)
I informed pt's wife, of Dr. Quay Burow [phone].

## 2018-04-04 NOTE — Telephone Encounter (Signed)
pts wife called and pt is scheduled to see you on 12.18 but she was asking if you could recommend a vascular doctor in the [place] area.

## 2018-04-04 NOTE — Telephone Encounter (Signed)
Dr Roderic Palau berry

## 2018-04-06 ENCOUNTER — Ambulatory Visit: Payer: Medicare Other | Admitting: Occupational Therapy

## 2018-04-06 DIAGNOSIS — I890 Lymphedema, not elsewhere classified: Secondary | ICD-10-CM | POA: Diagnosis not present

## 2018-04-06 NOTE — Therapy (Signed)
[place] MAIN [facility] SERVICES [address] [place], [place], [address] Phone: [phone]   Fax:  [phone]  Occupational Therapy Treatment  Patient Details  Name: Steven Meza. MRN: [id number] Date of Birth: [birth year] Referring Provider (OT): Felipa Furnace, MD   Encounter Date: 04/06/2018  OT End of Session - 04/06/18 1637    Visit Number  22    Number of Visits  36    Date for OT Re-Evaluation  05/14/18    OT Start Time  0100    OT Stop Time  0215    OT Time Calculation (min)  75 min    Activity Tolerance  Patient tolerated treatment well;No increased pain    Behavior During Therapy  WFL for tasks assessed/performed       Past Medical History:  Diagnosis Date  . Cellulitis   . HBP (high blood pressure)   . Hearing loss   . Sleep apnea   . Stroke [facility])     Past Surgical History:  Procedure Laterality Date  . BLADDER SURGERY    . KNEE SURGERY Right     There were no vitals filed for this visit.  Subjective Assessment - 04/06/18 1634    Subjective   [name] arrives for OT rx visit 22/36 to address BLE lymphedema. Pt is accompanied by his wife, Steven Meza. Pt presents w/ BLE wraps in place. Pt reports he had no leg pain overnight. Spouse reports she has name for a vascular doc from referring physician. OT encouraged her to call for consult to limit development of additional leg ulcers.    Patient is accompained by:  Family member    Pertinent History  Conditions contributing to BLE lymphedema include morbid obesity, (BMI 42.13 kg/m2) OA, OSA, (declines CPap) HTN, venous stasis and recurrent cellulitis    Limitations  difficulty walking, impaired transfers and functional mobility, impaired sleep, HOH (declines hearing aids) , chronic leg swelling and associated pain, impaired LB dressing and bathing, decreased AROM at feet, ankles, knees 2/2 body habitus and girth, impaired standing and walking balance with 3 falls in last 3  months,      Patient Stated Goals  stop he lymphorrhea, get the swelling down and keep it from getting worse.    Currently in Pain?  No/denies    Pain Onset  --   > 2 years                  OT Treatments/Exercises (OP) - 04/06/18 0001      ADLs   ADL Education Given  Yes      Manual Therapy   Manual Therapy  Edema management;Manual Lymphatic Drainage (MLD)    Edema Management  skin care to LLE  below knee during MLD uasing low ph, skin grade castor oil    Manual Lymphatic Drainage (MLD)  MLD to LLE  using functional pathways, includng short neck sequence, deep abdominals, functional inguinal LN and full leg sequence.  Care taken in area of LLE wounds to limit any damage to heling tissue.    Compression Bandaging  BLE compression wraps w short stretch bandages  fom foot to politeal fossa. Reduced LLE wraps from 3 to 1, removing smallest 8 cm bandage as it is most compressive at narrowest part ofg leg. Added 2 rolls 15 cm wide Artiflex cast padding from leg to ankle to bump out shae of leg into more of a column shape. In keeping w/ law of [place], this should help to reduceincreased pressue accumulated at smallest area of circumference.              OT Education - 04/06/18 1636    Education Details  Continued skilled Pt/caregiver education  And LE ADL training throughout visit for lymphedema self care/ home program, including compression wrapping, compression garment and device wear/care, lymphatic pumping ther ex, simple self-MLD, and skin care. Discussed progress towards goals.     Person(s) Educated  Patient;Spouse    Methods  Explanation;Demonstration;Handout;Verbal cues    Comprehension  Verbalized understanding;Returned demonstration;Verbal cues required          OT Long Term Goals - 04/03/18 1624      OT LONG TERM GOAL #1   Title  Pt will demonstrate understanding of lymphedema (LE) precautions / prevention principals, including signs / symptoms of  cellulitis infection with modified independence using LE Workbook as printed reference to identify 6 precautions without verbal cues by end of 6th  OT Rx visit.     Baseline  Max A    Time  6    Period  Weeks    Status  Achieved      OT LONG TERM GOAL #2   Title  Pt will be able to apply multi-layer short stretch compression wraps using correct gradient techniques with Max CG assistance to achieve optimal limb volume reduction during Intensive Phase CDT, and to return affected limb(s) , as closely as possible, to premorbid size and shape.    Baseline  Dependent    Time  6    Status  Achieved      OT LONG TERM GOAL #3   Title  Pt to achieve no less than 10% limb volume reduction in affected limb(s) during Intensive Phase CDT to control limb swelling, to improve tissue integrity and immune function, to improve ADLs performance and to improve functional mobility/ transfers.    Baseline  Max A 03/07/2018:ACHIEVED FOR lle  04/03/2018: LLE limb volume and skin condition continue to fluctuate weekly. RLE limb volume reduction appears stable, although we have not measured it recently  due to focus of Rx on LLE continues.    Time  12    Period  Weeks    Status  Partially Met      OT LONG TERM GOAL #4   Title  With moderate  CG assistance Pt will achieve 100% compliance with daily LE self-care home program components, including proper skin care, simple self-MLD, gradient compression wraps/ garments, and therapeutic exercise to ensure optimal Intensive Phase limb volume reduction to expedite compression garment/ device fitting.    Baseline  Max A    Time  12    Period  Weeks    Status  Achieved      OT LONG TERM GOAL #5   Title  Pt will demonstrate competent use of assistive devices during LE self-care training to improve ability to lift legs onto treatment table, to don and doff shoes and socks, and to don and doff compression garments/devices with Max  assistance to ensure optimal LE self-management  over time.    Baseline  Dependent    Time  12    Period  Weeks    Status  Partially Met      OT LONG TERM GOAL #6   Title  Pt will retain optimal limb volume reductions achieved during Intensive Phase CDT with no more than 3% volume increase with ongoing CG assistance to limit LE progression  and further functional decline.    Baseline  Dependent    Time  6    Period  Months    Status  Partially Met            Plan - 04/06/18 1637    Clinical Impression Statement  Marked improvement again today in skin comndition and LLE swelling appears well controled with modified compression wrap set up. Medial L leg ulcer is closed today. Lateral L leg ulcers remain open, but appear slightly smaller in diameter today. Lateral leg wound with very small amout of lymphorrhea. No odor and no sign of infection. Redness on RELE is noticably reduced since last visit. Artiflex at ankles and distyal legs has helped to alleviate excess compression in those areas of smaller circumference. Pt tolerate MLD, skin care and compression wrapping without difficulty. Cont as per POC.    Occupational performance deficits (Please refer to evaluation for details):  ADL's;IADL's;Leisure;Rest and Sleep;Social Participation;Other   productive activities, ambulation and functional mobility   Rehab Potential  Fair    OT Frequency  3x / week    OT Duration  12 weeks    OT Treatment/Interventions  Self-care/ADL training;Therapeutic exercise;DME and/or AE instruction;Therapist, nutritional;Compression bandaging;Other (comment);Manual Therapy;Patient/family education;Therapeutic activities;Manual lymph drainage;Energy conservation   skin care w/ low ph lotion and castor oil ([place])   Clinical Decision Making  Multiple treatment options, significant modification of task necessary    OT Home Exercise Plan  Juzo, "Dynamic", A-D, ccl 2 ( 30-40 mmHg) circular knit , elastic compression garments- open toe and silicone top  band    Consulted and Agree with Plan of Care  Patient;Family member/caregiver    Family Member Consulted  spouse - Steven Meza       Patient will benefit from skilled therapeutic intervention in order to improve the following deficits and impairments:  Abnormal gait, Decreased balance, Decreased endurance, Decreased mobility, Decreased skin integrity, Difficulty walking, Impaired sensation, Obesity, Pain, Impaired perceived functional ability, Decreased knowledge of precautions, Decreased range of motion, Increased edema, Decreased knowledge of use of DME, Impaired flexibility  Visit Diagnosis: Lymphedema, not elsewhere classified    Problem List There are no active problems to display for this patient.   Andrey Spearman, MS, OTR/L, [facility] 04/06/18 4:42 PM  [place] MAIN [facility] SERVICES [address] [place], [place], [address] Phone: [phone]   Fax:  [phone]  Name: Steven Meza. MRN: [id number] Date of Birth: 19-Dec-1930

## 2018-04-10 ENCOUNTER — Ambulatory Visit: Payer: Medicare Other | Admitting: Occupational Therapy

## 2018-04-10 DIAGNOSIS — I890 Lymphedema, not elsewhere classified: Secondary | ICD-10-CM | POA: Diagnosis not present

## 2018-04-10 NOTE — Therapy (Signed)
[place] MAIN [facility] SERVICES [address] [place], [place], [address] Phone: [phone]   Fax:  [phone]  Occupational Therapy Treatment  Patient Details  Name: Steven Meza. MRN: [id number] Date of Birth: 14-Sep-1930 Referring Provider (OT): Felipa Furnace, MD   Encounter Date: 04/10/2018  OT End of Session - 04/10/18 1704    Visit Number  23    Number of Visits  36    Date for OT Re-Evaluation  05/14/18    OT Start Time  0100    OT Stop Time  0210    OT Time Calculation (min)  70 min    Activity Tolerance  Patient tolerated treatment well;No increased pain    Behavior During Therapy  WFL for tasks assessed/performed       Past Medical History:  Diagnosis Date  . Cellulitis   . HBP (high blood pressure)   . Hearing loss   . Sleep apnea   . Stroke [facility])     Past Surgical History:  Procedure Laterality Date  . BLADDER SURGERY    . KNEE SURGERY Right     There were no vitals filed for this visit.  Subjective Assessment - 04/10/18 1701    Subjective   Steven Meza arrives for OT rx visit 23/36 to address BLE lymphedema. Pt is accompanied by his wife, [name]. Pt presents w/ BLE wraps in place. Pt reports he has no leg pain. Spouse reorts she is working on appointment with vascular physician.    Patient is accompained by:  Family member    Pertinent History  Conditions contributing to BLE lymphedema include morbid obesity, (BMI 42.13 kg/m2) OA, OSA, (declines CPap) HTN, venous stasis and recurrent cellulitis    Limitations  difficulty walking, impaired transfers and functional mobility, impaired sleep, HOH (declines hearing aids) , chronic leg swelling and associated pain, impaired LB dressing and bathing, decreased AROM at feet, ankles, knees 2/2 body habitus and girth, impaired standing and walking balance with 3 falls in last 3 months,      Patient Stated Goals  stop he lymphorrhea, get the swelling down and keep it from  getting worse.    Currently in Pain?  No/denies    Pain Onset  --   > 2 years                  OT Treatments/Exercises (OP) - 04/10/18 0001      ADLs   ADL Education Given  Yes      Manual Therapy   Manual Therapy  Edema management;Manual Lymphatic Drainage (MLD)    Edema Management  skin care to LLE  below knee during MLD uasing low ph, skin grade castor oil    Manual Lymphatic Drainage (MLD)  MLD to LLE  using functional pathways, includng short neck sequence, deep abdominals, functional inguinal LN and full leg sequence.  Care taken in area of LLE wounds to limit any damage to heling tissue.    Compression Bandaging  BLE compression wraps w short stretch bandages  fom foot to politeal fossa. Reduced LLE wraps from 3 to 1, removing smallest 8 cm bandage as it is most compressive at narrowest part ofg leg. Added 2 rolls 15 cm wide Artiflex cast padding from leg to ankle to bump out shae of leg into more of a column shape. In keeping w/ law of [place], this should help to reduceincreased pressue accumulated at smallest area of circumference.  OT Education - 04/10/18 1703    Education Details  Reviewed OT goals of therapy and progress thus far. Discussed long term LE management goals going forward and plan for caregiver assistance to sustain clinical gains. Discuissed importance of ongoing, daily compliance to limit LE progression, risk of venous ulcers, and infection risk    Person(s) Educated  Patient;Spouse    Methods  Explanation;Demonstration;Handout;Verbal cues    Comprehension  Verbalized understanding;Returned demonstration;Verbal cues required          OT Long Term Goals - 04/03/18 1624      OT LONG TERM GOAL #1   Title  Pt will demonstrate understanding of lymphedema (LE) precautions / prevention principals, including signs / symptoms of cellulitis infection with modified independence using LE Workbook as printed reference to identify 6  precautions without verbal cues by end of 6th  OT Rx visit.     Baseline  Max A    Time  6    Period  Weeks    Status  Achieved      OT LONG TERM GOAL #2   Title  Pt will be able to apply multi-layer short stretch compression wraps using correct gradient techniques with Max CG assistance to achieve optimal limb volume reduction during Intensive Phase CDT, and to return affected limb(s) , as closely as possible, to premorbid size and shape.    Baseline  Dependent    Time  6    Status  Achieved      OT LONG TERM GOAL #3   Title  Pt to achieve no less than 10% limb volume reduction in affected limb(s) during Intensive Phase CDT to control limb swelling, to improve tissue integrity and immune function, to improve ADLs performance and to improve functional mobility/ transfers.    Baseline  Max A 03/07/2018:ACHIEVED FOR lle  04/03/2018: LLE limb volume and skin condition continue to fluctuate weekly. RLE limb volume reduction appears stable, although we have not measured it recently  due to focus of Rx on LLE continues.    Time  12    Period  Weeks    Status  Partially Met      OT LONG TERM GOAL #4   Title  With moderate  CG assistance Pt will achieve 100% compliance with daily LE self-care home program components, including proper skin care, simple self-MLD, gradient compression wraps/ garments, and therapeutic exercise to ensure optimal Intensive Phase limb volume reduction to expedite compression garment/ device fitting.    Baseline  Max A    Time  12    Period  Weeks    Status  Achieved      OT LONG TERM GOAL #5   Title  Pt will demonstrate competent use of assistive devices during LE self-care training to improve ability to lift legs onto treatment table, to don and doff shoes and socks, and to don and doff compression garments/devices with Max  assistance to ensure optimal LE self-management over time.    Baseline  Dependent    Time  12    Period  Weeks    Status  Partially Met      OT  LONG TERM GOAL #6   Title  Pt will retain optimal limb volume reductions achieved during Intensive Phase CDT with no more than 3% volume increase with ongoing CG assistance to limit LE progression and further functional decline.    Baseline  Dependent    Time  6    Period  Months  Status  Partially Met            Plan - 04/10/18 1705    Clinical Impression Statement  LE managed well during Rx interval. LLE open wounds with minimal exudate and lymphorrhea today. Compression wraps applied incorrectly. Emphasis of Pt and family edu on review of progress towards goals thus far and going forward, and on correct gradient compression wrap application. Pt tolerated MLD without difficulty. Cont to monitor skin condition carefully. Cont OT as per POC. Reduce Ot LE rx frequency to 1 x weekly x 4 weeks in new year. Pt prefers to continue with daily wraps instead of purchasing compression garments at this time.    Occupational performance deficits (Please refer to evaluation for details):  ADL's;IADL's;Leisure;Rest and Sleep;Social Participation;Other   productive activities, ambulation and functional mobility   Rehab Potential  Fair    OT Frequency  3x / week    OT Duration  12 weeks    OT Treatment/Interventions  Self-care/ADL training;Therapeutic exercise;DME and/or AE instruction;Therapist, nutritional;Compression bandaging;Other (comment);Manual Therapy;Patient/family education;Therapeutic activities;Manual lymph drainage;Energy conservation   skin care w/ low ph lotion and castor oil ([place])   Clinical Decision Making  Multiple treatment options, significant modification of task necessary    OT Home Exercise Plan  [place], "Dynamic", A-D, ccl 2 ( 30-40 mmHg) circular knit , elastic compression garments- open toe and silicone top band    Consulted and Agree with Plan of Care  Patient;Family member/caregiver    Family Member Consulted  spouse - [name]       Patient will benefit from  skilled therapeutic intervention in order to improve the following deficits and impairments:  Abnormal gait, Decreased balance, Decreased endurance, Decreased mobility, Decreased skin integrity, Difficulty walking, Impaired sensation, Obesity, Pain, Impaired perceived functional ability, Decreased knowledge of precautions, Decreased range of motion, Increased edema, Decreased knowledge of use of DME, Impaired flexibility  Visit Diagnosis: Lymphedema, not elsewhere classified    Problem List There are no active problems to display for this patient.   Andrey Spearman, MS, OTR/L, [facility] 04/10/18 5:09 PM  [place] MAIN [facility] SERVICES [address] [place], [place], [address] Phone: [phone]   Fax:  [phone]  Name: Steven Meza. MRN: [id number] Date of Birth: Jun 09, 1930

## 2018-04-12 ENCOUNTER — Ambulatory Visit: Payer: Self-pay | Admitting: Podiatry

## 2018-04-12 ENCOUNTER — Encounter

## 2018-04-13 ENCOUNTER — Ambulatory Visit: Payer: Medicare Other | Admitting: Occupational Therapy

## 2018-04-13 DIAGNOSIS — I890 Lymphedema, not elsewhere classified: Secondary | ICD-10-CM | POA: Diagnosis not present

## 2018-04-13 NOTE — Therapy (Signed)
[place] MAIN [facility] SERVICES [address] [place], [place], [address] Phone: [phone]   Fax:  [phone]  Occupational Therapy Treatment  Patient Details  Name: Steven Meza. MRN: [id number] Date of Birth: 1930-12-23 Referring Provider (OT): Felipa Furnace, MD   Encounter Date: 04/13/2018  OT End of Session - 04/13/18 1740    Visit Number  24    Number of Visits  36    Date for OT Re-Evaluation  05/14/18    OT Start Time  0105    OT Stop Time  0200    OT Time Calculation (min)  55 min    Activity Tolerance  Patient tolerated treatment well;No increased pain    Behavior During Therapy  WFL for tasks assessed/performed       Past Medical History:  Diagnosis Date  . Cellulitis   . HBP (high blood pressure)   . Hearing loss   . Sleep apnea   . Stroke [facility])     Past Surgical History:  Procedure Laterality Date  . BLADDER SURGERY    . KNEE SURGERY Right     There were no vitals filed for this visit.  Subjective Assessment - 04/13/18 1738    Subjective   Mr Mccaffrey arrives for OT rx visit 24/36 to address BLE lymphedema. Pt is accompanied by his wife, [name]. Pt presents w/ BLE wraps in place. Pt reports he has no leg pain during visit interval.     Patient is accompained by:  Family member    Pertinent History  Conditions contributing to BLE lymphedema include morbid obesity, (BMI 42.13 kg/m2) OA, OSA, (declines CPap) HTN, venous stasis and recurrent cellulitis    Limitations  difficulty walking, impaired transfers and functional mobility, impaired sleep, HOH (declines hearing aids) , chronic leg swelling and associated pain, impaired LB dressing and bathing, decreased AROM at feet, ankles, knees 2/2 body habitus and girth, impaired standing and walking balance with 3 falls in last 3 months,      Patient Stated Goals  stop he lymphorrhea, get the swelling down and keep it from getting worse.    Currently in Pain?  No/denies     Pain Onset  --   > 2 years                  OT Treatments/Exercises (OP) - 04/13/18 0001      ADLs   ADL Education Given  Yes      Manual Therapy   Manual Therapy  Edema management;Manual Lymphatic Drainage (MLD);Compression Bandaging    Edema Management  skin care to LLE  below knee during MLD uasing low ph, skin grade castor oil    Manual Lymphatic Drainage (MLD)  MLD to LLE  using functional pathways, includng short neck sequence, deep abdominals, functional inguinal LN and full leg sequence.  Care taken in area of LLE wounds to limit any damage to heling tissue.    Compression Bandaging  BLE compression wraps w short stretch bandages  fom foot to politeal fossa. Reduced LLE wraps from 3 to 1, removing smallest 8 cm bandage as it is most compressive at narrowest part ofg leg. Added 2 rolls 15 cm wide Artiflex cast padding from leg to ankle to bump out shae of leg into more of a column shape. In keeping w/ law of [place], this should help to reduceincreased pressue accumulated at smallest area of circumference.  OT Education - 04/13/18 1739    Education Details  Cont to review gradient compression wrap techniques with family. Wrap quality has declined over the past week.    Person(s) Educated  Patient;Spouse    Methods  Explanation;Demonstration;Handout;Verbal cues    Comprehension  Verbalized understanding;Returned demonstration;Verbal cues required          OT Long Term Goals - 04/03/18 1624      OT LONG TERM GOAL #1   Title  Pt will demonstrate understanding of lymphedema (LE) precautions / prevention principals, including signs / symptoms of cellulitis infection with modified independence using LE Workbook as printed reference to identify 6 precautions without verbal cues by end of 6th  OT Rx visit.     Baseline  Max A    Time  6    Period  Weeks    Status  Achieved      OT LONG TERM GOAL #2   Title  Pt will be able to apply multi-layer  short stretch compression wraps using correct gradient techniques with Max CG assistance to achieve optimal limb volume reduction during Intensive Phase CDT, and to return affected limb(s) , as closely as possible, to premorbid size and shape.    Baseline  Dependent    Time  6    Status  Achieved      OT LONG TERM GOAL #3   Title  Pt to achieve no less than 10% limb volume reduction in affected limb(s) during Intensive Phase CDT to control limb swelling, to improve tissue integrity and immune function, to improve ADLs performance and to improve functional mobility/ transfers.    Baseline  Max A 03/07/2018:ACHIEVED FOR lle  04/03/2018: LLE limb volume and skin condition continue to fluctuate weekly. RLE limb volume reduction appears stable, although we have not measured it recently  due to focus of Rx on LLE continues.    Time  12    Period  Weeks    Status  Partially Met      OT LONG TERM GOAL #4   Title  With moderate  CG assistance Pt will achieve 100% compliance with daily LE self-care home program components, including proper skin care, simple self-MLD, gradient compression wraps/ garments, and therapeutic exercise to ensure optimal Intensive Phase limb volume reduction to expedite compression garment/ device fitting.    Baseline  Max A    Time  12    Period  Weeks    Status  Achieved      OT LONG TERM GOAL #5   Title  Pt will demonstrate competent use of assistive devices during LE self-care training to improve ability to lift legs onto treatment table, to don and doff shoes and socks, and to don and doff compression garments/devices with Max  assistance to ensure optimal LE self-management over time.    Baseline  Dependent    Time  12    Period  Weeks    Status  Partially Met      OT LONG TERM GOAL #6   Title  Pt will retain optimal limb volume reductions achieved during Intensive Phase CDT with no more than 3% volume increase with ongoing CG assistance to limit LE progression and  further functional decline.    Baseline  Dependent    Time  6    Period  Months    Status  Partially Met            Plan - 04/13/18 1741    Clinical  Impression Statement  L lateral leg wounds without signs/ symptoms of infection. Granulating tissue is noted and smaller of the 2 wounds is closing more rapidly than larger wound. Pt has scheduled consult with vascular MD in January. Will continue to closely monitor skin condition in the meantijme. Pt edu today for simple self MLD w/ wmphasis on perfecting "J stroke" and short neck sequence. By end of session Pt was able to perform both with mod A, including modeling and physical cues. Provided MLD, skin care and compression therapy . Pt tolerated all modalities well. Spouse redressed  L lweg wounds before OT reapplied gradient compression wraps bilaterall. Cont as per OC.    Occupational performance deficits (Please refer to evaluation for details):  ADL's;IADL's;Leisure;Rest and Sleep;Social Participation;Other   productive activities, ambulation and functional mobility   Rehab Potential  Fair    OT Frequency  3x / week    OT Duration  12 weeks    OT Treatment/Interventions  Self-care/ADL training;Therapeutic exercise;DME and/or AE instruction;Therapist, nutritional;Compression bandaging;Other (comment);Manual Therapy;Patient/family education;Therapeutic activities;Manual lymph drainage;Energy conservation   skin care w/ low ph lotion and castor oil (pal[place]i)   Clinical Decision Making  Multiple treatment options, significant modification of task necessary    OT Home Exercise Plan  Juzo, "Dynamic", A-D, ccl 2 ( 30-40 mmHg) circular knit , elastic compression garments- open toe and silicone top band    Consulted and Agree with Plan of Care  Patient;Family member/caregiver    Family Member Consulted  spouse - [name]       Patient will benefit from skilled therapeutic intervention in order to improve the following deficits and  impairments:  Abnormal gait, Decreased balance, Decreased endurance, Decreased mobility, Decreased skin integrity, Difficulty walking, Impaired sensation, Obesity, Pain, Impaired perceived functional ability, Decreased knowledge of precautions, Decreased range of motion, Increased edema, Decreased knowledge of use of DME, Impaired flexibility  Visit Diagnosis: Lymphedema, not elsewhere classified    Problem List There are no active problems to display for this patient.   Andrey Spearman, MS, OTR/L, [facility] 04/13/18 5:46 PM  [place] MAIN [facility] SERVICES [address] [place], [place], [address] Phone: [phone]   Fax:  [phone]  Name: Jomar Denz. MRN: [id number] Date of Birth: [birth year]

## 2018-04-17 ENCOUNTER — Ambulatory Visit: Payer: Medicare Other | Admitting: Occupational Therapy

## 2018-04-17 DIAGNOSIS — I890 Lymphedema, not elsewhere classified: Secondary | ICD-10-CM | POA: Diagnosis not present

## 2018-04-18 ENCOUNTER — Ambulatory Visit: Payer: Medicare Other | Admitting: Occupational Therapy

## 2018-04-18 DIAGNOSIS — I890 Lymphedema, not elsewhere classified: Secondary | ICD-10-CM

## 2018-04-18 NOTE — Therapy (Signed)
[place] MAIN [facility] SERVICES [address] [place], [place], [address] Phone: [phone]   Fax:  [phone]  Occupational Therapy Treatment  Patient Details  Name: Steven Meza. MRN: [id number] Date of Birth: 1931-04-06 Referring Provider (OT): Felipa Furnace, MD   Encounter Date: 04/17/2018  OT End of Session - 04/17/18 1644    Visit Number  25    Number of Visits  36    Date for OT Re-Evaluation  05/14/18    OT Start Time  0100    OT Stop Time  0218    OT Time Calculation (min)  78 min    Activity Tolerance  Patient tolerated treatment well;No increased pain    Behavior During Therapy  WFL for tasks assessed/performed       Past Medical History:  Diagnosis Date  . Cellulitis   . HBP (high blood pressure)   . Hearing loss   . Sleep apnea   . Stroke [facility])     Past Surgical History:  Procedure Laterality Date  . BLADDER SURGERY    . KNEE SURGERY Right     There were no vitals filed for this visit.  Subjective Assessment - 04/17/18 1639    Subjective   Steven Meza arrives for OT rx visit 25/36 to address BLE lymphedema. Pt is accompanied by his wife, Steven Meza. Pt presents w/ BLE wraps in place. Pt denies leg pain today. Spouse continues to dress L leg lateral wounds with non-stick pad and perscription ointment under compression wraps. Pt denies lymphorrhea during visit interval., and none is  observed today.    Patient is accompained by:  Family member    Pertinent History  Conditions contributing to BLE lymphedema include morbid obesity, (BMI 42.13 kg/m2) OA, OSA, (declines CPap) HTN, venous stasis and recurrent cellulitis    Limitations  difficulty walking, impaired transfers and functional mobility, impaired sleep, HOH (declines hearing aids) , chronic leg swelling and associated pain, impaired LB dressing and bathing, decreased AROM at feet, ankles, knees 2/2 body habitus and girth, impaired standing and walking balance with 3  falls in last 3 months,      Patient Stated Goals  stop he lymphorrhea, get the swelling down and keep it from getting worse.    Currently in Pain?  No/denies    Pain Onset  --   > 2 years                  OT Treatments/Exercises (OP) - 04/18/18 0001      ADLs   ADL Education Given  Yes      Manual Therapy   Manual Therapy  Edema management;Manual Lymphatic Drainage (MLD);Compression Bandaging    Edema Management  skin care to LLE  below knee during MLD uasing low ph, skin grade castor oil    Manual Lymphatic Drainage (MLD)  MLD to LLE  using functional pathways, includng short neck sequence, deep abdominals, functional inguinal LN and full leg sequence.  Care taken in area of LLE wounds to limit any damage to heling tissue.    Compression Bandaging  BLE compression wraps w short stretch bandages  fom foot to politeal fossa. Reduced LLE wraps from 3 to 1, removing smallest 8 cm bandage as it is most compressive at narrowest part ofg leg. Added 2 rolls 15 cm wide Artiflex cast padding from leg to ankle to bump out shae of leg into more of a column shape. In keeping w/ law of  [place], this should help to reduceincreased pressue accumulated at smallest area of circumference.              OT Education - 04/18/18 1642    Education Details  Cont to review gradient compression  alternatives including various compression wrap systems (CWS). Provided demonstration of Lenore Cordia and CircAid systems.  Pt taught to don and doff using adjustable velcro straps from distal to proxima. Pt educated for optimal wear and care regimes.    Person(s) Educated  Patient;Spouse    Methods  Explanation;Demonstration;Handout;Verbal cues;Tactile cues    Comprehension  Verbalized understanding;Returned demonstration;Verbal cues required;Tactile cues required;Need further instruction          OT Long Term Goals - 04/03/18 1624      OT LONG TERM GOAL #1   Title  Pt will demonstrate  understanding of lymphedema (LE) precautions / prevention principals, including signs / symptoms of cellulitis infection with modified independence using LE Workbook as printed reference to identify 6 precautions without verbal cues by end of 6th  OT Rx visit.     Baseline  Max A    Time  6    Period  Weeks    Status  Achieved      OT LONG TERM GOAL #2   Title  Pt will be able to apply multi-layer short stretch compression wraps using correct gradient techniques with Max CG assistance to achieve optimal limb volume reduction during Intensive Phase CDT, and to return affected limb(s) , as closely as possible, to premorbid size and shape.    Baseline  Dependent    Time  6    Status  Achieved      OT LONG TERM GOAL #3   Title  Pt to achieve no less than 10% limb volume reduction in affected limb(s) during Intensive Phase CDT to control limb swelling, to improve tissue integrity and immune function, to improve ADLs performance and to improve functional mobility/ transfers.    Baseline  Max A 03/07/2018:ACHIEVED FOR lle  04/03/2018: LLE limb volume and skin condition continue to fluctuate weekly. RLE limb volume reduction appears stable, although we have not measured it recently  due to focus of Rx on LLE continues.    Time  12    Period  Weeks    Status  Partially Met      OT LONG TERM GOAL #4   Title  With moderate  CG assistance Pt will achieve 100% compliance with daily LE self-care home program components, including proper skin care, simple self-MLD, gradient compression wraps/ garments, and therapeutic exercise to ensure optimal Intensive Phase limb volume reduction to expedite compression garment/ device fitting.    Baseline  Max A    Time  12    Period  Weeks    Status  Achieved      OT LONG TERM GOAL #5   Title  Pt will demonstrate competent use of assistive devices during LE self-care training to improve ability to lift legs onto treatment table, to don and doff shoes and socks, and to  don and doff compression garments/devices with Max  assistance to ensure optimal LE self-management over time.    Baseline  Dependent    Time  12    Period  Weeks    Status  Partially Met      OT LONG TERM GOAL #6   Title  Pt will retain optimal limb volume reductions achieved during Intensive Phase CDT with no more than  3% volume increase with ongoing CG assistance to limit LE progression and further functional decline.    Baseline  Dependent    Time  6    Period  Months    Status  Partially Met            Plan - 04/17/18 1645    Clinical Impression Statement  L lateral leg wounds without signs/ symptoms of infection again today. Wounds healing very slowly. Compression and dressing well tolerated.  Cnot Pt  and family education regarding alternative, adjustable   compression wrap systems  again today. Loaned [employer] for Trial on RLE overnight. Provided MLD, skin care and compression wraps to LLE. Akll aspects of therapy tolerated without difficulty today.    Occupational performance deficits (Please refer to evaluation for details):  ADL's;IADL's;Leisure;Rest and Sleep;Social Participation;Other   productive activities, ambulation and functional mobility   Rehab Potential  Fair    OT Frequency  3x / week    OT Duration  12 weeks    OT Treatment/Interventions  Self-care/ADL training;Therapeutic exercise;DME and/or AE instruction;Therapist, nutritional;Compression bandaging;Other (comment);Manual Therapy;Patient/family education;Therapeutic activities;Manual lymph drainage;Energy conservation   skin care w/ low ph lotion and castor oil ([place])   Clinical Decision Making  Multiple treatment options, significant modification of task necessary    OT Home Exercise Plan  [place]o, "Dynamic", A-D, ccl 2 ( 30-40 mmHg) circular knit , elastic compression garments- open toe and silicone top band    Consulted and Agree with Plan of Care  Patient;Family member/caregiver     Family Member Consulted  spouse - Steven Meza       Patient will benefit from skilled therapeutic intervention in order to improve the following deficits and impairments:  Abnormal gait, Decreased balance, Decreased endurance, Decreased mobility, Decreased skin integrity, Difficulty walking, Impaired sensation, Obesity, Pain, Impaired perceived functional ability, Decreased knowledge of precautions, Decreased range of motion, Increased edema, Decreased knowledge of use of DME, Impaired flexibility  Visit Diagnosis: Lymphedema, not elsewhere classified    Problem List There are no active problems to display for this patient.   Andrey Spearman, MS, OTR/L, [facility] 04/18/18 4:50 PM  [place] MAIN [facility] SERVICES [address] [place], [place], [address] Phone: [phone]   Fax:  [phone]  Name: Denym Rahimi. MRN: [id number] Date of Birth: 1930-10-05

## 2018-04-19 ENCOUNTER — Encounter: Payer: Self-pay | Admitting: Podiatry

## 2018-04-19 ENCOUNTER — Ambulatory Visit (INDEPENDENT_AMBULATORY_CARE_PROVIDER_SITE_OTHER): Payer: Medicare Other | Admitting: Podiatry

## 2018-04-19 ENCOUNTER — Encounter

## 2018-04-19 DIAGNOSIS — B351 Tinea unguium: Secondary | ICD-10-CM | POA: Diagnosis not present

## 2018-04-19 DIAGNOSIS — M79676 Pain in unspecified toe(s): Secondary | ICD-10-CM | POA: Diagnosis not present

## 2018-04-19 DIAGNOSIS — I639 Cerebral infarction, unspecified: Secondary | ICD-10-CM | POA: Insufficient documentation

## 2018-04-19 DIAGNOSIS — L0390 Cellulitis, unspecified: Secondary | ICD-10-CM | POA: Insufficient documentation

## 2018-04-19 DIAGNOSIS — K5790 Diverticulosis of intestine, part unspecified, without perforation or abscess without bleeding: Secondary | ICD-10-CM | POA: Insufficient documentation

## 2018-04-19 DIAGNOSIS — I83023 Varicose veins of left lower extremity with ulcer of ankle: Secondary | ICD-10-CM | POA: Diagnosis not present

## 2018-04-19 DIAGNOSIS — J449 Chronic obstructive pulmonary disease, unspecified: Secondary | ICD-10-CM | POA: Insufficient documentation

## 2018-04-19 DIAGNOSIS — G4730 Sleep apnea, unspecified: Secondary | ICD-10-CM | POA: Insufficient documentation

## 2018-04-19 DIAGNOSIS — Z860100 Personal history of colon polyps, unspecified: Secondary | ICD-10-CM | POA: Insufficient documentation

## 2018-04-19 DIAGNOSIS — Z720 Tobacco use: Secondary | ICD-10-CM | POA: Insufficient documentation

## 2018-04-19 DIAGNOSIS — I890 Lymphedema, not elsewhere classified: Secondary | ICD-10-CM | POA: Insufficient documentation

## 2018-04-19 DIAGNOSIS — I878 Other specified disorders of veins: Secondary | ICD-10-CM | POA: Insufficient documentation

## 2018-04-19 DIAGNOSIS — L97329 Non-pressure chronic ulcer of left ankle with unspecified severity: Secondary | ICD-10-CM | POA: Diagnosis not present

## 2018-04-19 DIAGNOSIS — M1990 Unspecified osteoarthritis, unspecified site: Secondary | ICD-10-CM | POA: Insufficient documentation

## 2018-04-19 DIAGNOSIS — I10 Essential (primary) hypertension: Secondary | ICD-10-CM | POA: Insufficient documentation

## 2018-04-19 MED ORDER — MUPIROCIN 2 % EX OINT: TOPICAL_OINTMENT | CUTANEOUS | 1 refills | Status: DC

## 2018-04-19 NOTE — Therapy (Signed)
[place] MAIN [facility] SERVICES [address] [place], [place], [address] Phone: [phone]   Fax:  [phone]  Occupational Therapy Treatment  Patient Details  Name: Steven Meza. MRN: [id number] Date of Birth: 08/29/1930 Referring Provider (OT): Felipa Furnace, MD   Encounter Date: 04/18/2018  OT End of Session - 04/18/18 1334    Visit Number  26    Number of Visits  36    Date for OT Re-Evaluation  05/14/18    OT Start Time  0122    OT Stop Time  0215    OT Time Calculation (min)  53 min    Activity Tolerance  Patient tolerated treatment well;No increased pain    Behavior During Therapy  WFL for tasks assessed/performed       Past Medical History:  Diagnosis Date  . Cellulitis   . HBP (high blood pressure)   . Hearing loss   . Sleep apnea   . Stroke [facility])     Past Surgical History:  Procedure Laterality Date  . BLADDER SURGERY    . KNEE SURGERY Right     There were no vitals filed for this visit.  Subjective Assessment - 04/18/18 1330    Subjective   Steven Meza arrives for OT rx visit 25/66 to address BLE lymphedema. Pt is accompanied by his wife, Steven Meza. Pt reports he was able to don and doff loaned  CWS  independently, and her tolerated it overnight without difficulty.    Patient is accompained by:  Family member    Pertinent History  Conditions contributing to BLE lymphedema include morbid obesity, (BMI 42.13 kg/m2) OA, OSA, (declines CPap) HTN, venous stasis and recurrent cellulitis    Limitations  difficulty walking, impaired transfers and functional mobility, impaired sleep, HOH (declines hearing aids) , chronic leg swelling and associated pain, impaired LB dressing and bathing, decreased AROM at feet, ankles, knees 2/2 body habitus and girth, impaired standing and walking balance with 3 falls in last 3 months,      Patient Stated Goals  stop he lymphorrhea, get the swelling down and keep it from getting worse.    Currently in Pain?  No/denies    Pain Onset  --   > 2 years                  OT Treatments/Exercises (OP) - 04/19/18 0001      ADLs   ADL Education Given  Yes      Manual Therapy   Manual Therapy  Edema management;Compression Bandaging;Manual Lymphatic Drainage (MLD)    Edema Management  Non-stick pad with perscribed oitment applied to wounds at L lateral leg . Provided skin care to LLE  below knee during MLD uasing low ph, skin grade castor oil    Manual Lymphatic Drainage (MLD)  MLD to LLE  using functional pathways, includng short neck sequence, deep abdominals, functional inguinal LN and full leg sequence.  Care taken in area of LLE wounds to limit any damage to heling tissue.    Compression Bandaging  to RLE- knee length, loaned Farrow wrap classic over light sock./. Gradient compression wrap to LLE as established.              OT Education - 04/18/18 1334    Education Details  Cont to review gradient compression  alternatives including various compression wrap systems (CWS). Provided demonstration of Lenore Cordia and CircAid systems.  Pt taught to don and doff using adjustable  velcro straps from distal to proxima. Pt educated for optimal wear and care regimes.    Person(s) Educated  Patient;Spouse    Methods  Explanation;Demonstration;Handout;Verbal cues;Tactile cues    Comprehension  Verbalized understanding;Returned demonstration;Verbal cues required;Tactile cues required;Need further instruction          OT Long Term Goals - 04/03/18 1624      OT LONG TERM GOAL #1   Title  Pt will demonstrate understanding of lymphedema (LE) precautions / prevention principals, including signs / symptoms of cellulitis infection with modified independence using LE Workbook as printed reference to identify 6 precautions without verbal cues by end of 6th  OT Rx visit.     Baseline  Max A    Time  6    Period  Weeks    Status  Achieved      OT LONG TERM GOAL #2   Title   Pt will be able to apply multi-layer short stretch compression wraps using correct gradient techniques with Max CG assistance to achieve optimal limb volume reduction during Intensive Phase CDT, and to return affected limb(s) , as closely as possible, to premorbid size and shape.    Baseline  Dependent    Time  6    Status  Achieved      OT LONG TERM GOAL #3   Title  Pt to achieve no less than 10% limb volume reduction in affected limb(s) during Intensive Phase CDT to control limb swelling, to improve tissue integrity and immune function, to improve ADLs performance and to improve functional mobility/ transfers.    Baseline  Max A 03/07/2018:ACHIEVED FOR lle  04/03/2018: LLE limb volume and skin condition continue to fluctuate weekly. RLE limb volume reduction appears stable, although we have not measured it recently  due to focus of Rx on LLE continues.    Time  12    Period  Weeks    Status  Partially Met      OT LONG TERM GOAL #4   Title  With moderate  CG assistance Pt will achieve 100% compliance with daily LE self-care home program components, including proper skin care, simple self-MLD, gradient compression wraps/ garments, and therapeutic exercise to ensure optimal Intensive Phase limb volume reduction to expedite compression garment/ device fitting.    Baseline  Max A    Time  12    Period  Weeks    Status  Achieved      OT LONG TERM GOAL #5   Title  Pt will demonstrate competent use of assistive devices during LE self-care training to improve ability to lift legs onto treatment table, to don and doff shoes and socks, and to don and doff compression garments/devices with Max  assistance to ensure optimal LE self-management over time.    Baseline  Dependent    Time  12    Period  Weeks    Status  Partially Met      OT LONG TERM GOAL #6   Title  Pt will retain optimal limb volume reductions achieved during Intensive Phase CDT with no more than 3% volume increase with ongoing CG  assistance to limit LE progression and further functional decline.    Baseline  Dependent    Time  6    Period  Months    Status  Partially Met            Plan - 04/18/18 1335    Clinical Impression Statement  Pt tolerated loaned CWS to RLE  below knee overnight without difficulty. Slight increase in swelling noted today as Pt ddid not get velcrow straps quite tight enough when applying. Still, he was able to remove and reappy alternative compression wrap independently. This was a stated goal recently, that spouse would not have to assist w/ compression at home. Leg wounds on L lateral leg appear unchanged since yesterday. Wounds appear to be healing and are without signs/ symptoms of infection. Pt tolerated all aspects of CDT without difficulty today. Completed anatomical measurements for CircAid knee length compression wrap systems for BLE today and provided on line retail resources. Pt will require size XL LONG for LLE and size L LONG for less involved RLE. Cont as per POC.    Occupational performance deficits (Please refer to evaluation for details):  ADL's;IADL's;Leisure;Rest and Sleep;Social Participation;Other   productive activities, ambulation and functional mobility   Rehab Potential  Fair    OT Frequency  3x / week    OT Duration  12 weeks    OT Treatment/Interventions  Self-care/ADL training;Therapeutic exercise;DME and/or AE instruction;Therapist, nutritional;Compression bandaging;Other (comment);Manual Therapy;Patient/family education;Therapeutic activities;Manual lymph drainage;Energy conservation   skin care w/ low ph lotion and castor oil (palma christi)   Clinical Decision Making  Multiple treatment options, significant modification of task necessary    OT Home Exercise Plan  Juzo, "Dynamic", A-D, ccl 2 ( 30-40 mmHg) circular knit , elastic compression garments- open toe and silicone top band    Consulted and Agree with Plan of Care  Patient;Family member/caregiver     Family Member Consulted  spouse - Steven Meza       Patient will benefit from skilled therapeutic intervention in order to improve the following deficits and impairments:  Abnormal gait, Decreased balance, Decreased endurance, Decreased mobility, Decreased skin integrity, Difficulty walking, Impaired sensation, Obesity, Pain, Impaired perceived functional ability, Decreased knowledge of precautions, Decreased range of motion, Increased edema, Decreased knowledge of use of DME, Impaired flexibility  Visit Diagnosis: Lymphedema, not elsewhere classified    Problem List Patient Active Problem List   Diagnosis Date Noted  . Cellulitis 04/19/2018  . COPD (chronic obstructive pulmonary disease) ([place]) 04/19/2018  . Diverticulosis 04/19/2018  . History of colonic polyps 04/19/2018  . Hypertension 04/19/2018  . Morbid obesity ([place]) 04/19/2018  . Osteoarthritis 04/19/2018  . Sleep apnea 04/19/2018  . Stroke ([place]) 04/19/2018  . Tobacco abuse 04/19/2018  . Venous stasis 04/19/2018  . History of bladder cancer 02/20/2013  . Increased frequency of urination 02/20/2013  . Malignant neoplasm of other specified sites of bladder 02/20/2013  . Microscopic hematuria 02/20/2013   Andrey Spearman, MS, OTR/L, [facility] 04/19/18 1:41 PM   [place] MAIN [facility] SERVICES [address] [place], [place], [address] Phone: [phone]   Fax:  [phone]  Name: Steven Meza. MRN: [id number] Date of Birth: 1930/12/31

## 2018-04-20 ENCOUNTER — Ambulatory Visit: Payer: Medicare Other | Admitting: Occupational Therapy

## 2018-04-20 DIAGNOSIS — I890 Lymphedema, not elsewhere classified: Secondary | ICD-10-CM

## 2018-04-20 NOTE — Progress Notes (Signed)
  Subjective:  Patient ID: Steven Patten., male    DOB: 21-Mar-1931,  MRN: [id number] HPI Chief Complaint  Patient presents with  . Foot Ulcer    Patient presents today for concern for cellulitis left foot and wound left lower extremity lateral side x several months.  He states "it doesn't hurt unless you touch it"   He also request a nail trim today    84 y.o. male presents with the above complaint.   ROS: Denies fever chills nausea vomiting muscle aches pains calf pain back pain chest pain shortness of breath.  Past Medical History:  Diagnosis Date  . Cellulitis   . HBP (high blood pressure)   . Hearing loss   . Sleep apnea   . Stroke [facility])    Past Surgical History:  Procedure Laterality Date  . BLADDER SURGERY    . KNEE SURGERY Right     Current Outpatient Medications:  .  omeprazole (PRILOSEC) 40 MG capsule, Take by mouth., Disp: , Rfl:  .  aspirin 81 MG tablet, Take 81 mg by mouth daily., Disp: , Rfl:  .  hydrochlorothiazide (HYDRODIURIL) 25 MG tablet, Take 25 mg by mouth daily., Disp: , Rfl: 3 .  ibuprofen (ADVIL,MOTRIN) 200 MG tablet, Take by mouth., Disp: , Rfl:  .  lisinopril (PRINIVIL,ZESTRIL) 20 MG tablet, Take 40 mg by mouth daily., Disp: , Rfl: 3 .  mupirocin ointment (BACTROBAN) 2 %, Apply to wound after soaking BID, Disp: 30 g, Rfl: 1 .  triamcinolone cream (KENALOG) 0.5 %, APPLY CREAM TOPICALLY TWICE DAILY FOR UP TO 7 TO 10 DAYS, Disp: , Rfl: 22  No Known Allergies Review of Systems Objective:  There were no vitals filed for this visit.  General: Well developed, nourished, in no acute distress, alert and oriented x3   Dermatological: Skin is warm, dry and supple bilateral. Nails x 10 are well maintained; remaining integument appears unremarkable at this time. There are no open sores, no preulcerative lesions, no rash or signs of infection present.  Vascular: Dorsalis Pedis artery and Posterior Tibial artery pedal pulses are 2/4 bilateral with immedate  capillary fill time. Pedal hair growth present. No varicosities and no lower extremity edema present bilateral.   Neruologic: Grossly intact via light touch bilateral. Vibratory intact via tuning fork bilateral. Protective threshold with Semmes Wienstein monofilament intact to all pedal sites bilateral. Patellar and Achilles deep tendon reflexes 2+ bilateral. No Babinski or clonus noted bilateral.   Musculoskeletal: No gross boney pedal deformities bilateral. No pain, crepitus, or limitation noted with foot and ankle range of motion bilateral. Muscular strength 5/5 in all groups tested bilateral.  Gait: Unassisted, Nonantalgic.    Radiographs:  None taken  Assessment & Plan:   Assessment: Venous stasis ulceration lateral aspect of the left leg being treated by lymphedema clinic.  Toenails are long thick yellow dystrophic clinically mycotic.  Plan: Discussed etiology pathology and surgical therapies.  At this point wrote a prescription for Bactroban ointment to be placed in the lesion daily on follow-up with him in about 3 weeks.  Debrided nails 1 through 5 bilateral.     [name] T. [place], [place]

## 2018-04-20 NOTE — Therapy (Signed)
[place] MAIN [facility] SERVICES [address] [place], [place], [address] Phone: [phone]   Fax:  [phone]  Occupational Therapy Treatment  Patient Details  Name: Steven Meza. MRN: [id number] Date of Birth: 05/01/1931 Referring Provider (OT): Felipa Furnace, MD   Encounter Date: 04/20/2018  OT End of Session - 04/20/18 1707    Visit Number  27    Number of Visits  36    Date for OT Re-Evaluation  05/14/18    OT Start Time  0100    OT Stop Time  0210    OT Time Calculation (min)  70 min    Activity Tolerance  Patient tolerated treatment well;No increased pain    Behavior During Therapy  WFL for tasks assessed/performed       Past Medical History:  Diagnosis Date  . Cellulitis   . HBP (high blood pressure)   . Hearing loss   . Sleep apnea   . Stroke [facility])     Past Surgical History:  Procedure Laterality Date  . BLADDER SURGERY    . KNEE SURGERY Right     There were no vitals filed for this visit.  Subjective Assessment - 04/20/18 1314    Subjective   Steven Meza arrives for OT rx visit 26/36 to address BLE lymphedema. Pt is accompanied by his wife, Steven Meza. Pt's wife reports Dr Appt went well , and Pt was given new topical ointment  for LLE wounds. Pt presents wearing loaned knee length Steven Meza on RLE and knee length ss compression wrap on LLE. Pt has no new complaints. He denies leg pain.     Patient is accompained by:  Family member    Pertinent History  Conditions contributing to BLE lymphedema include morbid obesity, (BMI 42.13 kg/m2) OA, OSA, (declines CPap) HTN, venous stasis and recurrent cellulitis    Limitations  difficulty walking, impaired transfers and functional mobility, impaired sleep, HOH (declines hearing aids) , chronic leg swelling and associated pain, impaired LB dressing and bathing, decreased AROM at feet, ankles, knees 2/2 body habitus and girth, impaired standing and walking balance with 3 falls in  last 3 months,      Patient Stated Goals  stop he lymphorrhea, get the swelling down and keep it from getting worse.    Currently in Pain?  No/denies    Pain Onset  --   > 2 years                  OT Treatments/Exercises (OP) - 04/20/18 0001      ADLs   ADL Education Given  Yes      Manual Therapy   Manual Therapy  Edema management;Compression Bandaging;Manual Lymphatic Drainage (MLD)    Edema Management  Non-stick pad with perscribed oitment applied to wounds at L lateral leg . Provided skin care to LLE  below knee during MLD uasing low ph, skin grade castor oil    Manual Lymphatic Drainage (MLD)  MLD to LLE  using functional pathways, includng short neck sequence, deep abdominals, functional inguinal LN and full leg sequence.  Care taken in area of LLE wounds to limit any damage to heling tissue.    Compression Bandaging  to RLE- knee length, loaned Farrow wrap classic over light sock./. Gradient compression wrap to LLE as established.              OT Education - 04/20/18 1319    Education Details  Continued skilled Pt/caregiver  education  And LE ADL training throughout visit for lymphedema self care/ home program, including compression wrapping, compression garment and device wear/care, lymphatic pumping ther ex, simple self-MLD, and skin care. Discussed progress towards goals.     Person(s) Educated  Patient;Spouse    Methods  Explanation;Demonstration;Handout;Verbal cues;Tactile cues    Comprehension  Verbalized understanding;Returned demonstration;Verbal cues required;Tactile cues required;Need further instruction          OT Long Term Goals - 04/20/18 1319      OT LONG TERM GOAL #1   Title  Pt will demonstrate understanding of lymphedema (LE) precautions / prevention principals, including signs / symptoms of cellulitis infection with modified independence using LE Workbook as printed reference to identify 6 precautions without verbal cues by end of 6th  OT  Rx visit.   (Pended)     Baseline  ....................................................  (Pended)     Time  6  (Pended)     Period  Weeks  (Pended)     Status  Achieved  (Pended)       OT LONG TERM GOAL #2   Title  Pt will be able to apply multi-layer short stretch compression wraps using correct gradient techniques with Max CG assistance to achieve optimal limb volume reduction during Intensive Phase CDT, and to return affected limb(s) , as closely as possible, to premorbid size and shape.  (Pended)     Baseline  Dependent  (Pended)     Time  6  (Pended)     Status  Achieved  (Pended)       OT LONG TERM GOAL #3   Title  0   (Pended)     Time  12  (Pended)     Period  Weeks  (Pended)     Status  Partially Met  (Pended)       OT LONG TERM GOAL #4   Title  With moderate  CG assistance Pt will achieve 100% compliance with daily LE self-care home program components, including proper skin care, simple self-MLD, gradient compression wraps/ garments, and therapeutic exercise to ensure optimal Intensive Phase limb volume reduction to expedite compression garment/ device fitting.  (Pended)     Baseline  Max A  (Pended)     Time  12  (Pended)     Period  Weeks  (Pended)     Status  Achieved  (Pended)       OT LONG TERM GOAL #5   Title  Pt will demonstrate competent use of assistive devices during LE self-care training to improve ability to lift legs onto treatment table, to don and doff shoes and socks, and to don and doff compression garments/devices with Max  assistance to ensure optimal LE self-management over time.  (Pended)     Baseline  Dependent  (Pended)     Time  12  (Pended)     Period  Weeks  (Pended)     Status  Achieved  (Pended)       OT LONG TERM GOAL #6   Title  Pt will retain optimal limb volume reductions achieved during Intensive Phase CDT with no more than 3% volume increase with ongoing CG assistance to limit LE progression and further functional decline.  (Pended)      Baseline  Dependent  (Pended)     Time  6  (Pended)     Period  Months  (Pended)     Status  Partially Met  (Pended)  Plan - 04/20/18 1708    Clinical Impression Statement  Inspected legs below the knees bilaterally. Provided MLD, skin care, and gradient compression wrap to LLE, and  reapplied [employer] to RLE at end of session. Skin condition continues to improve bilaterally. LLE wounds slow to heal.  Pt and spouse agree to continue w/ 100 % compliance w/ all LE self care protocols over the holiday break. Pt will call or text PRN if needed. Cont as per POC.    Occupational performance deficits (Please refer to evaluation for details):  ADL's;IADL's;Leisure;Rest and Sleep;Social Participation;Other   productive activities, ambulation and functional mobility   Rehab Potential  Fair    OT Frequency  3x / week    OT Duration  12 weeks    OT Treatment/Interventions  Self-care/ADL training;Therapeutic exercise;DME and/or AE instruction;Therapist, nutritional;Compression bandaging;Other (comment);Manual Therapy;Patient/family education;Therapeutic activities;Manual lymph drainage;Energy conservation   skin care w/ low ph lotion and castor oil (palma christi)   Clinical Decision Making  Multiple treatment options, significant modification of task necessary    OT Home Exercise Plan  Juzo, "Dynamic", A-D, ccl 2 ( 30-40 mmHg) circular knit , elastic compression garments- open toe and silicone top band    Consulted and Agree with Plan of Care  Patient;Family member/caregiver    Family Member Consulted  spouse - Steven Meza       Patient will benefit from skilled therapeutic intervention in order to improve the following deficits and impairments:  Abnormal gait, Decreased balance, Decreased endurance, Decreased mobility, Decreased skin integrity, Difficulty walking, Impaired sensation, Obesity, Pain, Impaired perceived functional ability, Decreased knowledge of precautions, Decreased  range of motion, Increased edema, Decreased knowledge of use of DME, Impaired flexibility  Visit Diagnosis: Lymphedema, not elsewhere classified    Problem List Patient Active Problem List   Diagnosis Date Noted  . Cellulitis 04/19/2018  . COPD (chronic obstructive pulmonary disease) ([place]) 04/19/2018  . Diverticulosis 04/19/2018  . History of colonic polyps 04/19/2018  . Hypertension 04/19/2018  . Morbid obesity ([place]) 04/19/2018  . Osteoarthritis 04/19/2018  . Sleep apnea 04/19/2018  . Stroke ([place]) 04/19/2018  . Tobacco abuse 04/19/2018  . Venous stasis 04/19/2018  . History of bladder cancer 02/20/2013  . Increased frequency of urination 02/20/2013  . Malignant neoplasm of other specified sites of bladder 02/20/2013  . Microscopic hematuria 02/20/2013    Steven Spearman, MS, OTR/L, [facility] 04/20/18 5:11 PM   [place] MAIN [facility] SERVICES [address] [place], [place], [address] Phone: [phone]   Fax:  [phone]  Name: Steven Meza. MRN: [id number] Date of Birth: December 27, 1930

## 2018-05-05 ENCOUNTER — Encounter: Payer: Self-pay | Admitting: Cardiovascular Disease

## 2018-05-05 ENCOUNTER — Ambulatory Visit (INDEPENDENT_AMBULATORY_CARE_PROVIDER_SITE_OTHER): Payer: Medicare Other | Admitting: Cardiovascular Disease

## 2018-05-05 DIAGNOSIS — I878 Other specified disorders of veins: Secondary | ICD-10-CM

## 2018-05-05 NOTE — Assessment & Plan Note (Signed)
Steven Meza has lymphedema and was referred by Dr. Milinda Pointer, his podiatrist, for evaluation treatment of a venous stasis ulcer.  I do not treat these and have referred him to the [facility] long wound care center for further evaluation.

## 2018-05-05 NOTE — Progress Notes (Signed)
Mr. Frith has lymphedema and was referred by Dr. Milinda Pointer, his podiatrist, for evaluation treatment of a venous stasis ulcer.  I do not treat these and have referred him to the [facility] long wound care center for further evaluation.   Lorretta Harp, M.D., [place], [facility], [place] [address] [place], [place]  [address]  [phone] 05/05/2018 1:56 PM

## 2018-05-05 NOTE — Patient Instructions (Addendum)
Medication Instructions:  NONE If you need a refill on your cardiac medications before your next appointment, please call your pharmacy.   Lab work: NONE If you have labs (blood work) drawn today and your tests are completely normal, you will receive your results only by: Marland Kitchen MyChart Message (if you have MyChart) OR . A paper copy in the mail If you have any lab test that is abnormal or we need to change your treatment, we will call you to review the results.  Testing/Procedures: NONE  Follow-Up: At [facility], you and your health needs are our priority.   NO FOLLOW-UP NEEDED AT THIS TIME   Any Other Special Instructions Will Be Listed Below (If Applicable). DR. Gwenlyn Found RECOMMENDS THAT YOU FOLLOW UP WITH Linton Ham, MD AT:  [place] at [facility]  [address], [place], [place] [address]   [phone]

## 2018-05-09 ENCOUNTER — Ambulatory Visit: Payer: Medicare Other | Attending: Internal Medicine | Admitting: Occupational Therapy

## 2018-05-09 DIAGNOSIS — I890 Lymphedema, not elsewhere classified: Secondary | ICD-10-CM | POA: Insufficient documentation

## 2018-05-09 NOTE — Therapy (Signed)
[facility] [address] [place], [place], [address] Phone: [phone]   Fax:  [phone]  Occupational Therapy Treatment  Patient Details  Name: Steven A Banton Jr. MRN: [id number] Date of Birth: 11/17/1930 Referring Provider (OT): Jeffery Sparks, MD   Encounter Date: 05/09/2018  OT End of Session - 05/09/18 1311    Visit Number  28    Number of Visits  36    Date for OT Re-Evaluation  05/14/18    OT Start Time  1000    OT Stop Time  1103    OT Time Calculation (min)  63 min    Activity Tolerance  Patient tolerated treatment well;No increased pain    Behavior During Therapy  WFL for tasks assessed/performed       Past Medical History:  Diagnosis Date  . Cellulitis   . HBP (high blood pressure)   . Hearing loss   . Sleep apnea   . Stroke (HCC)     Past Surgical History:  Procedure Laterality Date  . BLADDER SURGERY    . KNEE SURGERY Right     There were no vitals filed for this visit.  Subjective Assessment - 05/09/18 1305    Subjective   [name] arrives for OT rx visit 28/36 to address BLE lymphedema. Pt is accompanied by his wife, Steven Meza. Pt was last seen for  lymphedema care on 12/16. His wife reports he has been diligent with wraps on LLE, but minimally compliant with compression on RLE since last visit. "I have been putting lotion on every day, but I figured the R leg was doing OK without compression.    Patient is accompained by:  Family member    Pertinent History  Conditions contributing to BLE lymphedema include morbid obesity, (BMI 42.13 kg/m2) OA, OSA, (declines CPap) HTN, venous stasis and recurrent cellulitis    Limitations  difficulty walking, impaired transfers and functional mobility, impaired sleep, HOH (declines hearing aids) , chronic leg swelling and associated pain, impaired LB dressing and bathing, decreased AROM at feet, ankles, knees 2/2 body habitus and girth, impaired standing and  walking balance with 3 falls in last 3 months,      Patient Stated Goals  stop he lymphorrhea, get the swelling down and keep it from getting worse.    Currently in Pain?  No/denies    Pain Onset  --   > 2 years                  OT Treatments/Exercises (OP) - 05/09/18 0001      ADLs   ADL Education Given  Yes      Manual Therapy   Manual Therapy  Edema management;Compression Bandaging;Manual Lymphatic Drainage (MLD)    Manual therapy comments  skin care to BLE w/ low ph Eucerin lotion before reapplying compression wraps.     Edema Management  Non-stick pad with perscribed oitment applied to wounds at L lateral leg . Provided skin care to LLE  below knee during MLD uasing low ph, skin grade castor oil    Manual Lymphatic Drainage (MLD)  MLD to LLE  using functional pathways, includng short neck sequence, deep abdominals, functional inguinal LN and full leg sequence.  Care taken in area of LLE wounds to limit any damage to heling tissue.    Compression Bandaging  Multi layer gradient compression wraps applied below both legs as established.               OT Education - 05/09/18 1309    Education Details  Reviewed importance of appropriate daytime BILATERAL lower extremity  compression wraps or garments daily from now on to optimally control chronic leg swelling and to ensure optimal tissue integrity.     Person(s) Educated  Patient;Spouse    Methods  Explanation;Demonstration;Handout;Verbal cues;Tactile cues    Comprehension  Verbalized understanding;Returned demonstration;Verbal cues required;Tactile cues required;Need further instruction          OT Long Term Goals - 04/20/18 1319      OT LONG TERM GOAL #1   Title  Pt will demonstrate understanding of lymphedema (LE) precautions / prevention principals, including signs / symptoms of cellulitis infection with modified independence using LE Workbook as printed reference to identify 6 precautions without verbal cues  by end of 6th  OT Rx visit.   (Pended)     Baseline  ....................................................  (Pended)     Time  6  (Pended)     Period  Weeks  (Pended)     Status  Achieved  (Pended)       OT LONG TERM GOAL #2   Title  Pt will be able to apply multi-layer short stretch compression wraps using correct gradient techniques with Max CG assistance to achieve optimal limb volume reduction during Intensive Phase CDT, and to return affected limb(s) , as closely as possible, to premorbid size and shape.  (Pended)     Baseline  Dependent  (Pended)     Time  6  (Pended)     Status  Achieved  (Pended)       OT LONG TERM GOAL #3   Title  0   (Pended)     Time  12  (Pended)     Period  Weeks  (Pended)     Status  Partially Met  (Pended)       OT LONG TERM GOAL #4   Title  With moderate  CG assistance Pt will achieve 100% compliance with daily LE self-care home program components, including proper skin care, simple self-MLD, gradient compression wraps/ garments, and therapeutic exercise to ensure optimal Intensive Phase limb volume reduction to expedite compression garment/ device fitting.  (Pended)     Baseline  Max A  (Pended)     Time  12  (Pended)     Period  Weeks  (Pended)     Status  Achieved  (Pended)       OT LONG TERM GOAL #5   Title  Pt will demonstrate competent use of assistive devices during LE self-care training to improve ability to lift legs onto treatment table, to don and doff shoes and socks, and to don and doff compression garments/devices with Max  assistance to ensure optimal LE self-management over time.  (Pended)     Baseline  Dependent  (Pended)     Time  12  (Pended)     Period  Weeks  (Pended)     Status  Achieved  (Pended)       OT LONG TERM GOAL #6   Title  Pt will retain optimal limb volume reductions achieved during Intensive Phase CDT with no more than 3% volume increase with ongoing CG assistance to limit LE progression and further functional decline.   (Pended)     Baseline  Dependent  (Pended)     Time  6  (Pended)     Period  Months  (Pended)     Status  Partially Met  (Pended)  Plan - 05/09/18 1312    Clinical Impression Statement  LLE is mildly swollen with increased interstitial pressure causing ulcer to deepen and bulge a bit at deepest layer. New small ulcer noted ~ cm from existing lateral L lower leg cluster of wounds. RLE wounds are scabbed over but not yet resolved fully  RLE presenting with worsening cobblestone skin texture 2/2 lymphatic cysts. Reviewed importance of 100% compliance with BILATERAL compression, impecable skin care and leg elevation to retain clinical gains and to facilitate wound healing. Wrapped legs as established following LLE MOLD and BLE skin care. Pt to see wound care clinic next week.     Occupational performance deficits (Please refer to evaluation for details):  ADL's;IADL's;Leisure;Rest and Sleep;Social Participation;Other   productive activities, ambulation and functional mobility   Rehab Potential  Fair    OT Frequency  3x / week    OT Duration  12 weeks    OT Treatment/Interventions  Self-care/ADL training;Therapeutic exercise;DME and/or AE instruction;Functional Mobility Training;Compression bandaging;Other (comment);Manual Therapy;Patient/family education;Therapeutic activities;Manual lymph drainage;Energy conservation   skin care w/ low ph lotion and castor oil ([place])   Clinical Decision Making  Multiple treatment options, significant modification of task necessary    OT Home Exercise Plan  Juzo, "Dynamic", A-D, ccl 2 ( 30-40 mmHg) circular knit , elastic compression garments- open toe and silicone top band    Consulted and Agree with Plan of Care  Patient;Family member/caregiver    Family Member Consulted  spouse - Steven Meza       Patient will benefit from skilled therapeutic intervention in order to improve the following deficits and impairments:  Abnormal gait, Decreased  balance, Decreased endurance, Decreased mobility, Decreased skin integrity, Difficulty walking, Impaired sensation, Obesity, Pain, Impaired perceived functional ability, Decreased knowledge of precautions, Decreased range of motion, Increased edema, Decreased knowledge of use of DME, Impaired flexibility  Visit Diagnosis: Lymphedema, not elsewhere classified    Problem List Patient Active Problem List   Diagnosis Date Noted  . Cellulitis 04/19/2018  . COPD (chronic obstructive pulmonary disease) (HCC) 04/19/2018  . Diverticulosis 04/19/2018  . History of colonic polyps 04/19/2018  . Hypertension 04/19/2018  . Morbid obesity (HCC) 04/19/2018  . Osteoarthritis 04/19/2018  . Sleep apnea 04/19/2018  . Stroke (HCC) 04/19/2018  . Tobacco abuse 04/19/2018  . Venous stasis 04/19/2018  . History of bladder cancer 02/20/2013  . Increased frequency of urination 02/20/2013  . Malignant neoplasm of other specified sites of bladder 02/20/2013  . Microscopic hematuria 02/20/2013    Theresa Gilliam, MS, OTR/L, CLT-LANA 05/09/18 1:27 PM  [facility] [facility] [address] [place], [place], [address] Phone: [phone]   Fax:  [phone]  Name: Steven A Scheeler Jr. MRN: [id number] Date of Birth: 09/02/1930 

## 2018-05-16 ENCOUNTER — Ambulatory Visit: Payer: Medicare Other | Admitting: Occupational Therapy

## 2018-05-16 DIAGNOSIS — I890 Lymphedema, not elsewhere classified: Secondary | ICD-10-CM

## 2018-05-17 ENCOUNTER — Encounter: Payer: Medicare Other | Attending: Internal Medicine | Admitting: Internal Medicine

## 2018-05-17 ENCOUNTER — Other Ambulatory Visit: Payer: Self-pay | Admitting: Internal Medicine

## 2018-05-17 DIAGNOSIS — Z6835 Body mass index (BMI) 35.0-35.9, adult: Secondary | ICD-10-CM | POA: Insufficient documentation

## 2018-05-17 DIAGNOSIS — Z833 Family history of diabetes mellitus: Secondary | ICD-10-CM | POA: Diagnosis not present

## 2018-05-17 DIAGNOSIS — M1990 Unspecified osteoarthritis, unspecified site: Secondary | ICD-10-CM | POA: Insufficient documentation

## 2018-05-17 DIAGNOSIS — I70212 Atherosclerosis of native arteries of extremities with intermittent claudication, left leg: Secondary | ICD-10-CM | POA: Diagnosis not present

## 2018-05-17 DIAGNOSIS — Z8551 Personal history of malignant neoplasm of bladder: Secondary | ICD-10-CM | POA: Insufficient documentation

## 2018-05-17 DIAGNOSIS — I872 Venous insufficiency (chronic) (peripheral): Secondary | ICD-10-CM | POA: Diagnosis not present

## 2018-05-17 DIAGNOSIS — S81802A Unspecified open wound, left lower leg, initial encounter: Secondary | ICD-10-CM

## 2018-05-17 DIAGNOSIS — E1151 Type 2 diabetes mellitus with diabetic peripheral angiopathy without gangrene: Secondary | ICD-10-CM | POA: Insufficient documentation

## 2018-05-17 DIAGNOSIS — Z8249 Family history of ischemic heart disease and other diseases of the circulatory system: Secondary | ICD-10-CM | POA: Diagnosis not present

## 2018-05-17 DIAGNOSIS — I10 Essential (primary) hypertension: Secondary | ICD-10-CM | POA: Diagnosis not present

## 2018-05-17 DIAGNOSIS — E11622 Type 2 diabetes mellitus with other skin ulcer: Secondary | ICD-10-CM | POA: Diagnosis present

## 2018-05-17 DIAGNOSIS — I2510 Atherosclerotic heart disease of native coronary artery without angina pectoris: Secondary | ICD-10-CM | POA: Diagnosis not present

## 2018-05-17 DIAGNOSIS — Z87891 Personal history of nicotine dependence: Secondary | ICD-10-CM | POA: Diagnosis not present

## 2018-05-17 DIAGNOSIS — S81801A Unspecified open wound, right lower leg, initial encounter: Secondary | ICD-10-CM

## 2018-05-17 DIAGNOSIS — I890 Lymphedema, not elsewhere classified: Secondary | ICD-10-CM | POA: Diagnosis not present

## 2018-05-17 DIAGNOSIS — Z809 Family history of malignant neoplasm, unspecified: Secondary | ICD-10-CM | POA: Insufficient documentation

## 2018-05-17 DIAGNOSIS — L97229 Non-pressure chronic ulcer of left calf with unspecified severity: Secondary | ICD-10-CM | POA: Insufficient documentation

## 2018-05-18 NOTE — Progress Notes (Signed)
KAYLIN, SCHELLENBERG ([id number]) Visit Report for 05/17/2018 Allergy List Details Patient Name: Steven Meza, Steven Meza Date of Service: 05/17/2018 8:45 AM Medical Record Number: [id number] Patient Account Number: [id number] Date of Birth/Sex: 1930/09/23 (85 y.o. M) Treating RN: Secundino Ginger Primary Care Tymara Saur: Fulton Reek Other Clinician: Referring Massie Cogliano: Quay Burow Treating Hoorain Kozakiewicz/Extender: Ricard Dillon Weeks in Treatment: 0 Allergies Active Allergies No Known Allergies Allergy Notes Electronic Signature(s) Signed: 05/17/2018 9:53:12 AM By: Secundino Ginger Entered By: Secundino Ginger on 05/17/2018 09:04:20 Steven Meza ([id number]) -------------------------------------------------------------------------------- Arrival Information Details Patient Name: Steven Meza Date of Service: 05/17/2018 8:45 AM Medical Record Number: [id number] Patient Account Number: [id number] Date of Birth/Sex: [birth year] (85 y.o. M) Treating RN: Cornell Barman Primary Care Valincia Touch: Fulton Reek Other Clinician: Referring Maiya Kates: Quay Burow Treating Antha Niday/Extender: Steven Meza in Treatment: 0 Visit Information Patient Arrived: Kasandra Knudsen Arrival Time: 08:49 Accompanied By: wife Transfer Assistance: None Patient Identification Verified: Yes Secondary Verification Process Completed: Yes Electronic Signature(s) Signed: 05/17/2018 9:44:32 AM By: Lorine Bears RCP, RRT, CHT Entered By: Lorine Bears on 05/17/2018 08:50:51 Steven Meza ([id number]) -------------------------------------------------------------------------------- Clinic Level of Care Assessment Details Patient Name: Steven Meza Date of Service: 05/17/2018 8:45 AM Medical Record Number: [id number] Patient Account Number: [id number] Date of Birth/Sex: 07-Feb-1931 (85 y.o. M) Treating RN: Cornell Barman Primary Care Seydina Holliman: Fulton Reek Other Clinician: Referring Tryphena Perkovich: Quay Burow Treating Sonika Levins/Extender: Steven Meza in Treatment: 0 Clinic Level of Care Assessment Items TOOL 1 Quantity Score []  - Use when EandM and Procedure is performed on INITIAL visit 0 ASSESSMENTS - Nursing Assessment / Reassessment X - General Physical Exam (combine w/ comprehensive assessment (listed just below) when 1 20 performed on new pt. evals) X- 1 25 Comprehensive Assessment (HX, ROS, Risk Assessments, Wounds Hx, etc.) ASSESSMENTS - Wound and Skin Assessment / Reassessment []  - Dermatologic / Skin Assessment (not related to wound area) 0 ASSESSMENTS - Ostomy and/or Continence Assessment and Care []  - Incontinence Assessment and Management 0 []  - 0 Ostomy Care Assessment and Management (repouching, etc.) PROCESS - Coordination of Care X - Simple Patient / Family Education for ongoing care 1 15 []  - 0 Complex (extensive) Patient / Family Education for ongoing care X- 1 10 Staff obtains Programmer, systems, Records, Test Results / Process Orders []  - 0 Staff telephones HHA, Nursing Homes / Clarify orders / etc []  - 0 Routine Transfer to another Facility (non-emergent condition) []  - 0 Routine Hospital Admission (non-emergent condition) X- 1 15 New Admissions / Biomedical engineer / Ordering NPWT, Apligraf, etc. []  - 0 Emergency Hospital Admission (emergent condition) PROCESS - Special Needs []  - Pediatric / Minor Patient Management 0 []  - 0 Isolation Patient Management []  - 0 Hearing / Language / Visual special needs []  - 0 Assessment of Community assistance (transportation, D/C planning, etc.) []  - 0 Additional assistance / Altered mentation []  - 0 Support Surface(s) Assessment (bed, cushion, seat, etc.) Steven Meza, Steven Meza ([id number]) INTERVENTIONS - Miscellaneous []  - External ear exam 0 []  - 0 Patient Transfer (multiple staff / Civil Service fast streamer / Similar devices) []  - 0 Simple Staple / Suture removal (25 or less) []  - 0 Complex Staple / Suture  removal (26 or more) []  - 0 Hypo/Hyperglycemic Management (do not check if billed separately) X- 1 15 Ankle / Brachial Index (ABI) - do not check if billed separately Has the patient been seen at the hospital within the last three years: Yes Total Score: 100 Level Of Care:  New/Established - Level 3 Electronic Signature(s) Signed: 05/17/2018 5:16:46 PM By: Gretta Cool, BSN, RN, CWS, Kim RN, BSN Entered By: Gretta Cool, BSN, RN, CWS, Kim on 05/17/2018 09:54:59 Steven Meza ([id number]) -------------------------------------------------------------------------------- Encounter Discharge Information Details Patient Name: Steven Meza Date of Service: 05/17/2018 8:45 AM Medical Record Number: [id number] Patient Account Number: [id number] Date of Birth/Sex: [birth year] (85 y.o. M) Treating RN: Montey Hora Primary Care Nikos Anglemyer: Fulton Reek Other Clinician: Referring Gracelin Weisberg: Quay Burow Treating Aleta Manternach/Extender: Steven Meza in Treatment: 0 Encounter Discharge Information Items Post Procedure Vitals Discharge Condition: Stable Temperature (F): 98.1 Ambulatory Status: Cane Pulse (bpm): 80 Discharge Destination: Home Respiratory Rate (breaths/min): 18 Transportation: Private Auto Blood Pressure (mmHg): 144/61 Accompanied By: spouse Schedule Follow-up Appointment: Yes Clinical Summary of Care: Electronic Signature(s) Signed: 05/17/2018 4:53:09 PM By: Montey Hora Entered By: Montey Hora on 05/17/2018 10:08:17 Steven Meza ([id number]) -------------------------------------------------------------------------------- Lower Extremity Assessment Details Patient Name: Steven Meza Date of Service: 05/17/2018 8:45 AM Medical Record Number: [id number] Patient Account Number: [id number] Date of Birth/Sex: Aug 18, 1930 (85 y.o. M) Treating RN: Montey Hora Primary Care Senaida Chilcote: Fulton Reek Other Clinician: Referring Meleane Selinger: Quay Burow Treating  Steven Meza/Extender: Steven Meza in Treatment: 0 Edema Assessment Assessed: [Left: No] [Right: No] Edema: [Left: Yes] [Right: Yes] Calf Left: Right: Point of Measurement: 36 cm From Medial Instep 44.1 cm 43.7 cm Ankle Left: Right: Point of Measurement: 10 cm From Medial Instep 28.2 cm 28 cm Vascular Assessment Pulses: Dorsalis Pedis Palpable: [Left:Yes] [Right:Yes] Doppler Audible: [Left:Yes] [Right:Yes] Posterior Tibial Palpable: [Left:No] [Right:No] Doppler Audible: [Left:Inaudible] [Right:Inaudible] Extremity colors, hair growth, and conditions: Extremity Color: [Left:Hyperpigmented] Hair Growth on Extremity: [Left:No] [Right:No] Temperature of Extremity: [Left:Warm] [Right:Warm] Capillary Refill: [Left:< 3 seconds] [Right:< 3 seconds] Blood Pressure: Brachial: [Left:140] Dorsalis Pedis: 80 [Left:Dorsalis Pedis: 50] Ankle: Posterior Tibial: [Left:Posterior Tibial: 0.57] [Right:0.36] Toe Nail Assessment Left: Right: Thick: Yes Yes Discolored: Yes Yes Deformed: Yes Yes Improper Length and Hygiene: No No Electronic Signature(s) Signed: 05/17/2018 9:37:15 AM By: Montey Hora Previous Signature: 05/17/2018 9:15:12 AM Version By: Montey Hora Entered By: Montey Hora on 05/17/2018 09:37:15 Steven Meza ([id number] Meza ([id number]) -------------------------------------------------------------------------------- Multi Wound Chart Details Patient Name: Steven Meza Date of Service: 05/17/2018 8:45 AM Medical Record Number: [id number] Patient Account Number: [id number] Date of Birth/Sex: 1930-07-14 (85 y.o. M) Treating RN: Cornell Barman Primary Care Carmellia Kreisler: Fulton Reek Other Clinician: Referring Chrystel Barefield: Quay Burow Treating Ysabela Keisler/Extender: Steven Meza in Treatment: 0 Vital Signs Height(in): 71 Pulse(bpm): 80 Weight(lbs): 252 Blood Pressure(mmHg): 144/61 Body Mass Index(BMI): 35 Temperature(F):  98.1 Respiratory Rate 16 (breaths/min): Photos: [1:No Photos] [N/A:N/A] Wound Location: [1:Left Lower Leg - Lateral] [N/A:N/A] Wounding Event: [1:Gradually Appeared] [N/A:N/A] Primary Etiology: [1:Lymphedema] [N/A:N/A] Comorbid History: [1:Lymphedema, Coronary Artery N/A Disease, Hypertension] Date Acquired: [1:11/14/2017] [N/A:N/A] Weeks of Treatment: [1:0] [N/A:N/A] Wound Status: [1:Open] [N/A:N/A] Clustered Wound: [1:Yes] [N/A:N/A] Clustered Quantity: [1:2] [N/A:N/A] Measurements L x W x D [1:2x2x0.1] [N/A:N/A] (cm) Area (cm) : [1:3.142] [N/A:N/A] Volume (cm) : [1:0.314] [N/A:N/A] % Reduction in Area: [1:-456.10%] [N/A:N/A] % Reduction in Volume: [1:-450.90%] [N/A:N/A] Classification: [1:Full Thickness Without Exposed Support Structures] [N/A:N/A] Exudate Amount: [1:Medium] [N/A:N/A] Exudate Type: [1:Serous] [N/A:N/A] Exudate Color: [1:amber] [N/A:N/A] Wound Margin: [1:Flat and Intact] [N/A:N/A] Granulation Amount: [1:Small (1-33%)] [N/A:N/A] Granulation Quality: [1:Pink] [N/A:N/A] Necrotic Amount: [1:Large (67-100%)] [N/A:N/A] Exposed Structures: [1:Fat Layer (Subcutaneous Tissue) Exposed: Yes Fascia: No Tendon: No Muscle: No Joint: No Bone: No] [N/A:N/A] Epithelialization: [1:None] [N/A:N/A] Debridement: [1:Debridement - Excisional] [N/A:N/A] Pre-procedure [1:09:45] [N/A:N/A] Verification/Time Out Taken: Steven Meza,  Steven Meza ([id number]) Pain Control: Lidocaine N/A N/A Tissue Debrided: Subcutaneous, Slough N/A N/A Level: Skin/Subcutaneous Tissue N/A N/A Debridement Area (sq cm): 0.72 N/A N/A Instrument: Curette N/A N/A Bleeding: Large N/A N/A Hemostasis Achieved: Pressure N/A N/A Debridement Treatment Procedure was tolerated well N/A N/A Response: Post Debridement 2x2x0.1 N/A N/A Measurements L x W x D (cm) Post Debridement Volume: 0.314 N/A N/A (cm) Periwound Skin Texture: Excoriation: No N/A N/A Induration: No Callus: No Crepitus: No Rash: No Scarring:  No Periwound Skin Moisture: Maceration: No N/A N/A Dry/Scaly: No Periwound Skin Color: Hemosiderin Staining: Yes N/A N/A Atrophie Blanche: No Cyanosis: No Ecchymosis: No Erythema: No Mottled: No Pallor: No Rubor: No Temperature: No Abnormality N/A N/A Tenderness on Palpation: No N/A N/A Wound Preparation: Ulcer Cleansing: N/A N/A Rinsed/Irrigated with Saline Topical Anesthetic Applied: Other: lidocaine 4% Procedures Performed: Debridement N/A N/A Treatment Notes Wound #1 (Left, Lateral Lower Leg) Notes prisma, gauze and coverlet Electronic Signature(s) Signed: 05/17/2018 4:38:41 PM By: Linton Ham MD Entered By: Linton Ham on 05/17/2018 10:15:15 Steven Meza ([id number]) -------------------------------------------------------------------------------- Multi-Disciplinary Care Plan Details Patient Name: Steven Meza Date of Service: 05/17/2018 8:45 AM Medical Record Number: [id number] Patient Account Number: [id number] Date of Birth/Sex: 21-May-1930 (85 y.o. M) Treating RN: Cornell Barman Primary Care Shamari Trostel: Fulton Reek Other Clinician: Referring Donovon Micheletti: Quay Burow Treating Joandy Burget/Extender: Steven Meza in Treatment: 0 Active Inactive Orientation to the Wound Care Program Nursing Diagnoses: Knowledge deficit related to the wound healing center program Goals: Patient/caregiver will verbalize understanding of the [facility] Date Initiated: 05/17/2018 Target Resolution Date: 06/17/2018 Goal Status: Active Interventions: Provide education on orientation to the wound center Notes: Venous Leg Ulcer Nursing Diagnoses: Potential for venous Insuffiency (use before diagnosis confirmed) Goals: Patient/caregiver will verbalize understanding of disease process and disease management Date Initiated: 05/17/2018 Target Resolution Date: 06/17/2018 Goal Status: Active Interventions: Assess peripheral edema status every  visit. Treatment Activities: Non-invasive vascular studies : 05/17/2018 Notes: Wound/Skin Impairment Nursing Diagnoses: Impaired tissue integrity Goals: Patient/caregiver will verbalize understanding of skin care regimen Date Initiated: 05/17/2018 Target Resolution Date: 05/17/2018 Goal Status: Active Steven Meza, Steven Meza ([id number]) Ulcer/skin breakdown will heal within 14 weeks Date Initiated: 05/17/2018 Target Resolution Date: 08/16/2018 Goal Status: Active Interventions: Assess ulceration(s) every visit Treatment Activities: Referred to DME Dann Ventress for dressing supplies : 05/17/2018 Topical wound management initiated : 05/17/2018 Notes: Electronic Signature(s) Signed: 05/17/2018 5:16:46 PM By: Gretta Cool, BSN, RN, CWS, Kim RN, BSN Entered By: Gretta Cool, BSN, RN, CWS, Kim on 05/17/2018 09:46:25 Steven Meza ([id number]) -------------------------------------------------------------------------------- Pain Assessment Details Patient Name: Steven Meza Date of Service: 05/17/2018 8:45 AM Medical Record Number: [id number] Patient Account Number: [id number] Date of Birth/Sex: October 04, 1930 (85 y.o. M) Treating RN: Cornell Barman Primary Care Armie Moren: Fulton Reek Other Clinician: Referring Isabellamarie Randa: Quay Burow Treating Ciearra Rufo/Extender: Steven Meza in Treatment: 0 Active Problems Location of Pain Severity and Description of Pain Patient Has Paino No Site Locations Pain Management and Medication Current Pain Management: Electronic Signature(s) Signed: 05/17/2018 9:44:32 AM By: Lorine Bears RCP, RRT, CHT Signed: 05/17/2018 5:16:46 PM By: Gretta Cool, BSN, RN, CWS, Kim RN, BSN Entered By: Lorine Bears on 05/17/2018 08:51:05 Steven Meza ([id number]) -------------------------------------------------------------------------------- Patient/Caregiver Education Details Patient Name: Steven Meza Date of Service: 05/17/2018 8:45 AM Medical  Record Number: [id number] Patient Account Number: [id number] Date of Birth/Gender: 1931-03-13 (85 y.o. M) Treating RN: Cornell Barman Primary Care Physician: Fulton Reek Other Clinician: Referring Physician: Quay Burow Treating Physician/Extender: Ricard Dillon  Weeks in Treatment: 0 Education Assessment Education Provided To: Patient and Caregiver Education Topics Provided Venous: Handouts: Other: Lymphedema wraps Methods: Demonstration, Explain/Verbal Responses: State content correctly Wound/Skin Impairment: Handouts: Caring for Your Ulcer Methods: Demonstration, Explain/Verbal Responses: State content correctly Electronic Signature(s) Signed: 05/17/2018 5:16:46 PM By: Gretta Cool, BSN, RN, CWS, Kim RN, BSN Entered By: Gretta Cool, BSN, RN, CWS, Kim on 05/17/2018 09:55:53 Steven Meza ([id number]) -------------------------------------------------------------------------------- Wound Assessment Details Patient Name: Steven Meza Date of Service: 05/17/2018 8:45 AM Medical Record Number: [id number] Patient Account Number: [id number] Date of Birth/Sex: 1931/03/10 (85 y.o. M) Treating RN: Montey Hora Primary Care Alia Parsley: Fulton Reek Other Clinician: Referring Zyere Jiminez: Quay Burow Treating Amadea Keagy/Extender: Steven Meza in Treatment: 0 Wound Status Wound Number: 1 Primary Lymphedema Etiology: Wound Location: Left Lower Leg - Lateral Wound Status: Open Wounding Event: Gradually Appeared Comorbid Lymphedema, Coronary Artery Disease, Date Acquired: 11/14/2017 History: Hypertension Weeks Of Treatment: 0 Clustered Wound: Yes Photos Photo Uploaded By: Montey Hora on 05/17/2018 10:27:39 Wound Measurements Length: (cm) 2 % Redu Width: (cm) 2 % Redu Depth: (cm) 0.1 Epithe Clustered Quantity: 2 Tunnel Area: (cm) 3.142 Under Volume: (cm) 0.314 ction in Area: -456.1% ction in Volume: -450.9% lialization: None ing: No mining: No Wound  Description Full Thickness Without Exposed Support Foul O Classification: Structures Slough Wound Margin: Flat and Intact Exudate Medium Amount: Exudate Type: Serous Exudate Color: amber dor After Cleansing: No /Fibrino Yes Wound Bed Granulation Amount: Small (1-33%) Exposed Structure Granulation Quality: Pink Fascia Exposed: No Necrotic Amount: Large (67-100%) Fat Layer (Subcutaneous Tissue) Exposed: Yes Necrotic Quality: Adherent Slough Tendon Exposed: No Muscle Exposed: No Joint Exposed: No Steven Meza, Steven Meza ([id number]) Bone Exposed: No Periwound Skin Texture Texture Color No Abnormalities Noted: No No Abnormalities Noted: No Callus: No Atrophie Blanche: No Crepitus: No Cyanosis: No Excoriation: No Ecchymosis: No Induration: No Erythema: No Rash: No Hemosiderin Staining: Yes Scarring: No Mottled: No Pallor: No Moisture Rubor: No No Abnormalities Noted: No Dry / Scaly: No Temperature / Pain Maceration: No Temperature: No Abnormality Wound Preparation Ulcer Cleansing: Rinsed/Irrigated with Saline Topical Anesthetic Applied: Other: lidocaine 4%, Treatment Notes Wound #1 (Left, Lateral Lower Leg) Notes prisma, gauze and coverlet Electronic Signature(s) Signed: 05/17/2018 4:53:09 PM By: Montey Hora Signed: 05/17/2018 5:16:46 PM By: Gretta Cool, BSN, RN, CWS, Kim RN, BSN Previous Signature: 05/17/2018 9:14:02 AM Version By: Montey Hora Entered By: Gretta Cool BSN, RN, CWS, Kim on 05/17/2018 09:54:24 Steven Meza ([id number]) -------------------------------------------------------------------------------- Vitals Details Patient Name: Steven Meza Date of Service: 05/17/2018 8:45 AM Medical Record Number: [id number] Patient Account Number: [id number] Date of Birth/Sex: [birth year] (85 y.o. M) Treating RN: Cornell Barman Primary Care Nesreen Albano: Fulton Reek Other Clinician: Referring Malajah Oceguera: Quay Burow Treating Mellonie Guess/Extender: Steven Meza in Treatment: 0 Vital Signs Time Taken: 08:51 Temperature (F): 98.1 Height (in): 71 Pulse (bpm): 80 Weight (lbs): 252 Respiratory Rate (breaths/min): 16 Source: Measured Blood Pressure (mmHg): 144/61 Body Mass Index (BMI): 35.1 Reference Range: 80 - 120 mg / dl Electronic Signature(s) Signed: 05/17/2018 9:44:32 AM By: Lorine Bears RCP, RRT, CHT Entered By: Lorine Bears on 05/17/2018 08:59:20

## 2018-05-18 NOTE — Progress Notes (Signed)
MAXIE, SLOVACEK ([id number]) Visit Report for 05/17/2018 Debridement Details Patient Name: Steven Meza, Steven Meza Date of Service: 05/17/2018 8:45 AM Medical Record Number: [id number] Patient Account Number: [id number] Date of Birth/Sex: 1931/04/25 (85 y.o. M) Treating RN: Cornell Barman Primary Care Provider: Fulton Reek Other Clinician: Referring Provider: Quay Burow Treating Provider/Extender: Tito Dine in Treatment: 0 Debridement Performed for Wound #1 Left,Lateral Lower Leg Assessment: Performed By: Physician Ricard Dillon, MD Debridement Type: Debridement Level of Consciousness (Pre- Awake and Alert procedure): Pre-procedure Verification/Time Yes - 09:45 Out Taken: Start Time: 09:45 Pain Control: Lidocaine Total Area Debrided (L x W): 0.8 (cm) x 0.9 (cm) = 0.72 (cm) Tissue and other material Viable, Non-Viable, Slough, Subcutaneous, Skin: Dermis , Slough debrided: Level: Skin/Subcutaneous Tissue Debridement Description: Excisional Instrument: Curette Bleeding: Large Hemostasis Achieved: Pressure End Time: 09:52 Response to Treatment: Procedure was tolerated well Level of Consciousness Awake and Alert (Post-procedure): Post Debridement Measurements of Total Wound Length: (cm) 2 Width: (cm) 2 Depth: (cm) 0.1 Volume: (cm) 0.314 Character of Wound/Ulcer Post Debridement: Requires Further Debridement Post Procedure Diagnosis Same as Pre-procedure Electronic Signature(s) Signed: 05/17/2018 4:38:41 PM By: Linton Ham MD Signed: 05/17/2018 5:16:46 PM By: Gretta Cool, BSN, RN, CWS, Kim RN, BSN Entered By: Linton Ham on 05/17/2018 10:15:30 [name] ([id number]) -------------------------------------------------------------------------------- HPI Details Patient Name: [name] Date of Service: 05/17/2018 8:45 AM Medical Record Number: [id number] Patient Account Number: [id number] Date of Birth/Sex: [birth year] (85 y.o. M) Treating RN:  Cornell Barman Primary Care Provider: Fulton Reek Other Clinician: Referring Provider: Quay Burow Treating Provider/Extender: Tito Dine in Treatment: 0 History of Present Illness HPI Description: ADMISSION 05/17/2018 Mr. Agena is an 85 year old man who is been treated at the lymphedema clinic for a long period with lymphedema wraps for bilateral lower extremity lymphedema. About 6 months ago they noted small open areas on his left lateral calf just above the ankle. These were open. They are apparently putting some form of ointment on this. They have been referred here for our review of this. The patient has a long history of lymphedema and venous stasis. It says in his records that he has recurrent cellulitis although his wife denies this but she states that the lymphedema may have started with cellulitis several years ago. Also in his record there is a history of bladder cancer which they seem to know very little about however he did not have any radiation to the pelvis or anything that could have contributed to lymphedema that they are aware of. There is no prior wound history. The patient has 2 small punched out areas on the left lateral calf that have adherent debris on the surface. Past medical history; lymphedema, hypertension, cellulitis, hearing loss, morbid obesity, osteoarthritis, venous stasis, bladder CA, diverticulosis. ABIs in our clinic were 0.36 on the right and 0.57 on the left Electronic Signature(s) Signed: 05/17/2018 4:38:41 PM By: Linton Ham MD Entered By: Linton Ham on 05/17/2018 10:22:05 [name] ([id number]) -------------------------------------------------------------------------------- Physical Exam Details Patient Name: [name] Date of Service: 05/17/2018 8:45 AM Medical Record Number: [id number] Patient Account Number: [id number] Date of Birth/Sex: Apr 13, 1931 (85 y.o. M) Treating RN: Cornell Barman Primary Care Provider:  Fulton Reek Other Clinician: Referring Provider: Quay Burow Treating Provider/Extender: Tito Dine in Treatment: 0 Constitutional Sitting or standing Blood Pressure is within target range for patient.. Pulse regular and within target range for patient.Marland Kitchen Respirations regular, non-labored and within target range.. Temperature is normal and within the target range  for the patient.Marland Kitchen appears in no distress. Eyes Conjunctivae clear. No discharge. Ears, Nose, Mouth, and Throat Patient is hard of hearing. Severe bilateral. Respiratory Respiratory effort is easy and symmetric bilaterally. Rate is normal at rest and on room air.. Bilateral breath sounds are clear and equal in all lobes with no wheezes, rales or rhonchi.. Cardiovascular Heart rhythm and rate regular, without murmur or gallop.. I could not feel pulses in the bilateral popliteal or femoral area although this was difficult because of body mass. Pedal pulses absent bilaterally.. They have fairly good edema control here. I do not doubt that the patient has lymphedema. Some changes related to this including chronic thick skin on the medial ankle areas bilaterally. There is probably also some degree of venous insufficiency and stasis dermatitis.. Genitourinary (GU) Bladder is not distended there is no suprapubic tenderness. Lymphatic None palpable in the popliteal or inguinal area. Integumentary (Hair, Skin) I see no other primary skin issues other than the lower extremities. Psychiatric No evidence of depression, anxiety, or agitation. Calm, cooperative, and communicative. Appropriate interactions and affect.. Notes Wound exam; the patient has 2 small punched out areas on the left calf. Both of these covered and adherent fibrinous debris on the surface. Using a #3 curette I tried to gently remove this but developed some very significant leaking edema fluid from the lymphedema. There is no surrounding cellulitis.  Notable for the fact that the patient has very poor ABIs bilaterally. Nor could I feel his peripheral pulses anywhere in his lower extremities. His distal feet are cool but not cold. Capillary refill time is delayed Electronic Signature(s) Signed: 05/17/2018 4:38:41 PM By: Linton Ham MD Entered By: Linton Ham on 05/17/2018 10:26:50 [name] ([id number]) -------------------------------------------------------------------------------- Physician Orders Details Patient Name: [name] Date of Service: 05/17/2018 8:45 AM Medical Record Number: [id number] Patient Account Number: [id number] Date of Birth/Sex: 1930/06/18 (85 y.o. M) Treating RN: Cornell Barman Primary Care Provider: Fulton Reek Other Clinician: Referring Provider: Quay Burow Treating Provider/Extender: Tito Dine in Treatment: 0 Verbal / Phone Orders: No Diagnosis Coding Wound Cleansing Wound #1 Left,Lateral Lower Leg o Clean wound with Normal Saline. Anesthetic (add to Medication List) Wound #1 Left,Lateral Lower Leg o Topical Lidocaine 4% cream applied to wound bed prior to debridement (In Clinic Only). Primary Wound Dressing Wound #1 Left,Lateral Lower Leg o Silver Collagen Secondary Dressing Wound #1 Left,Lateral Lower Leg o Other - Coveret Dressing Change Frequency Wound #1 Left,Lateral Lower Leg o Change Dressing Monday, Wednesday, Friday Follow-up Appointments Wound #1 Left,Lateral Lower Leg o Return Appointment in 1 week. Edema Control o Other: - Wife to reapply lymphedema wraps bilaterally Additional Orders / Instructions Wound #1 Left,Lateral Lower Leg o Increase protein intake. Services and Therapies o Arterial Studies- Bilateral Electronic Signature(s) Signed: 05/17/2018 4:38:41 PM By: Linton Ham MD Signed: 05/17/2018 5:16:46 PM By: Gretta Cool, BSN, RN, CWS, Kim RN, BSN Entered By: Gretta Cool, BSN, RN, CWS, Kim on 05/17/2018 09:51:41 [name] ([id number]) -------------------------------------------------------------------------------- Problem List Details Patient Name: [name] Date of Service: 05/17/2018 8:45 AM Medical Record Number: [id number] Patient Account Number: [id number] Date of Birth/Sex: [birth year] (85 y.o. 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 with other specified 05/17/2018 No Yes severity I89.0 Lymphedema, not elsewhere classified 05/17/2018 No Yes I70.212  Atherosclerosis of native arteries of extremities with 05/17/2018 No Yes intermittent claudication, left leg Inactive Problems Resolved Problems Electronic Signature(s) Signed: 05/17/2018 4:38:41 PM By: Linton Ham MD Entered By: Linton Ham on 05/17/2018 10:14:15 [name] ([id number]) -------------------------------------------------------------------------------- Progress Note Details Patient Name: [name] Date of Service: 05/17/2018 8:45 AM Medical Record Number: [id number] Patient Account Number: [id number] Date of Birth/Sex: [birth year] (85 y.o. M) Treating RN: Cornell Barman Primary Care Provider: Fulton Reek Other Clinician: Referring Provider: Quay Burow Treating Provider/Extender: Tito Dine in Treatment: 0 Subjective History of Present Illness (HPI) ADMISSION 05/17/2018 Mr. Borges is an 85 year old man who is been treated at the lymphedema clinic for a long period with lymphedema wraps for bilateral lower extremity lymphedema. About 6 months ago they noted small open areas on his left lateral calf just above the ankle. These were open. They are apparently putting some form of ointment on this. They have been  referred here for our review of this. The patient has a long history of lymphedema and venous stasis. It says in his records that he has recurrent cellulitis although his wife denies this but she states that the lymphedema may have started with cellulitis several years ago. Also in his record there is a history of bladder cancer which they seem to know very little about however he did not have any radiation to the pelvis or anything that could have contributed to lymphedema that they are aware of. There is no prior wound history. The patient has 2 small punched out areas on the left lateral calf that have adherent debris on the surface. Past medical history; lymphedema, hypertension, cellulitis, hearing loss, morbid obesity, osteoarthritis, venous stasis, bladder CA, diverticulosis. ABIs in our clinic were 0.36 on the right and 0.57 on the left Wound History Patient reportedly has not tested positive for osteomyelitis. Patient reportedly has not had testing performed to evaluate circulation in the legs. Patient History Information obtained from Patient, wife. Allergies No Known Allergies Family History Cancer - Mother, Diabetes - Siblings, Heart Disease - Mother, No family history of Hereditary Spherocytosis, Hypertension, Kidney Disease, Lung Disease, Seizures, Stroke, Thyroid Problems, Tuberculosis. Social History Former smoker, Alcohol Use - Moderate, Drug Use - No History, Caffeine Use - Daily. Medical History Eyes Denies history of Cataracts, Glaucoma, Optic Neuritis Ear/Nose/Mouth/Throat Denies history of Chronic sinus problems/congestion, Middle ear problems Hematologic/Lymphatic Patient has history of Lymphedema Denies history of Anemia, Hemophilia, Human Immunodeficiency Virus, Sickle Cell Disease Respiratory EARNEST, MCGILLIS ([id number]) Denies history of Aspiration, Asthma, Chronic Obstructive Pulmonary Disease (COPD), Pneumothorax, Sleep  Apnea, Tuberculosis Cardiovascular Patient has history of Coronary Artery Disease, Hypertension Denies history of Angina, Arrhythmia, Congestive Heart Failure, Deep Vein Thrombosis, Hypotension, Myocardial Infarction, Peripheral Arterial Disease, Peripheral Venous Disease, Phlebitis, Vasculitis Gastrointestinal Denies history of Cirrhosis , Colitis, Crohn s, Hepatitis A, Hepatitis B, Hepatitis C Endocrine Denies history of Type I Diabetes, Type II Diabetes Genitourinary Denies history of End Stage Renal Disease Immunological Denies history of Lupus Erythematosus, Raynaud s, Scleroderma Integumentary (Skin) Denies history of History of Burn, History of pressure wounds Musculoskeletal Denies history of Gout, Rheumatoid Arthritis, Osteoarthritis, Osteomyelitis Neurologic Denies history of Dementia, Neuropathy, Quadriplegia, Paraplegia, Seizure Disorder Oncologic Denies history of Received Chemotherapy, Received Radiation Medical And Surgical History Notes Ear/Nose/Mouth/Throat hoh Review of Systems (ROS) Eyes Complains or has symptoms of Glasses / Contacts. Denies complaints or symptoms of Dry Eyes, Vision Changes. Ear/Nose/Mouth/Throat Denies complaints or symptoms of Difficult clearing ears, Sinusitis. Hematologic/Lymphatic Denies complaints or symptoms of  Bleeding / Clotting Disorders, Human Immunodeficiency Virus. Respiratory Denies complaints or symptoms of Chronic or frequent coughs, Shortness of Breath. Cardiovascular Denies complaints or symptoms of Chest pain. Gastrointestinal Denies complaints or symptoms of Frequent diarrhea, Nausea, Vomiting. Endocrine Denies complaints or symptoms of Hepatitis, Thyroid disease, Polydypsia (Excessive Thirst). Genitourinary Denies complaints or symptoms of Kidney failure/ Dialysis, Incontinence/dribbling. Immunological Denies complaints or symptoms of Hives, Itching. Integumentary (Skin) Complains or has symptoms of Swelling -  LEB. Musculoskeletal Denies complaints or symptoms of Muscle Pain, Muscle Weakness. Neurologic Denies complaints or symptoms of Numbness/parasthesias, Focal/Weakness. HOLLAND, [name] ([id number]) Objective Constitutional Sitting or standing Blood Pressure is within target range for patient.. Pulse regular and within target range for patient.Marland Kitchen Respirations regular, non-labored and within target range.. Temperature is normal and within the target range for the patient.Marland Kitchen appears in no distress. Vitals Time Taken: 8:51 AM, Height: 71 in, Weight: 252 lbs, Source: Measured, BMI: 35.1, Temperature: 98.1 F, Pulse: 80 bpm, Respiratory Rate: 16 breaths/min, Blood Pressure: 144/61 mmHg. Eyes Conjunctivae clear. No discharge. Ears, Nose, Mouth, and Throat Patient is hard of hearing. Severe bilateral. Respiratory Respiratory effort is easy and symmetric bilaterally. Rate is normal at rest and on room air.. Bilateral breath sounds are clear and equal in all lobes with no wheezes, rales or rhonchi.. Cardiovascular Heart rhythm and rate regular, without murmur or gallop.. I could not feel pulses in the bilateral popliteal or femoral area although this was difficult because of body mass. Pedal pulses absent bilaterally.. They have fairly good edema control here. I do not doubt that the patient has lymphedema. Some changes related to this including chronic thick skin on the medial ankle areas bilaterally. There is probably also some degree of venous insufficiency and stasis dermatitis.. Genitourinary (GU) Bladder is not distended there is no suprapubic tenderness. Lymphatic None palpable in the popliteal or inguinal area. Psychiatric No evidence of depression, anxiety, or agitation. Calm, cooperative, and communicative. Appropriate interactions and affect.. General Notes: Wound exam; the patient has 2 small punched out areas on the left calf. Both of these covered and adherent fibrinous debris on  the surface. Using a #3 curette I tried to gently remove this but developed some very significant leaking edema fluid from the lymphedema. There is no surrounding cellulitis. Notable for the fact that the patient has very poor ABIs bilaterally. Nor could I feel his peripheral pulses anywhere in his lower extremities. His distal feet are cool but not cold. Capillary refill time is delayed Integumentary (Hair, Skin) I see no other primary skin issues other than the lower extremities. Wound #1 status is Open. Original cause of wound was Gradually Appeared. The wound is located on the Left,Lateral Lower Leg. The wound measures 2cm length x 2cm width x 0.1cm depth; 3.142cm^2 area and 0.314cm^3 volume. There is Fat Layer (Subcutaneous Tissue) Exposed exposed. There is no tunneling or undermining noted. There is a medium amount of serous drainage noted. The wound margin is flat and intact. There is small (1-33%) pink granulation within the wound bed. There is a large (67-100%) amount of necrotic tissue within the wound bed including Adherent Slough. The periwound skin appearance exhibited: Hemosiderin Staining. The periwound skin appearance did not exhibit: Callus, Crepitus, Excoriation, Induration, Rash, Scarring, Dry/Scaly, Maceration, Atrophie Blanche, Cyanosis, Ecchymosis, Mottled, Pallor, Rubor, Erythema. Periwound temperature was noted as No Abnormality. LUISFERNANDO, BRIGHTWELL ([id number]) Assessment Active Problems ICD-10 Non-pressure chronic ulcer of left calf with other specified severity Lymphedema, not elsewhere classified Atherosclerosis of native arteries of  extremities with intermittent claudication, left leg Procedures Wound #1 Pre-procedure diagnosis of Wound #1 is a Lymphedema located on the Left,Lateral Lower Leg . There was a Excisional Skin/Subcutaneous Tissue Debridement with a total area of 0.72 sq cm performed by Ricard Dillon, MD. With the following instrument(s): Curette to  remove Viable and Non-Viable tissue/material. Material removed includes Subcutaneous Tissue, Slough, and Skin: Dermis after achieving pain control using Lidocaine. No specimens were taken. A time out was conducted at 09:45, prior to the start of the procedure. A Large amount of bleeding was controlled with Pressure. The procedure was tolerated well. Post Debridement Measurements: 2cm length x 2cm width x 0.1cm depth; 0.314cm^3 volume. Character of Wound/Ulcer Post Debridement requires further debridement. Post procedure Diagnosis Wound #1: Same as Pre-Procedure Plan Wound Cleansing: Wound #1 Left,Lateral Lower Leg: Clean wound with Normal Saline. Anesthetic (add to Medication List): Wound #1 Left,Lateral Lower Leg: Topical Lidocaine 4% cream applied to wound bed prior to debridement (In Clinic Only). Primary Wound Dressing: Wound #1 Left,Lateral Lower Leg: Silver Collagen Secondary Dressing: Wound #1 Left,Lateral Lower Leg: Other - Coveret Dressing Change Frequency: Wound #1 Left,Lateral Lower Leg: Change Dressing Monday, Wednesday, Friday Follow-up Appointments: Wound #1 Left,Lateral Lower Leg: Return Appointment in 1 week. Edema Control: Other: - Wife to reapply lymphedema wraps bilaterally Additional Orders / Instructions: Wound #1 Left,Lateral Lower Leg: Increase protein intake. MAYES, SANGIOVANNI ([id number]) Services and Therapies ordered were: Arterial Studies- Bilateral 1. The patient definitely has lymphedema probably chronic venous insufficiency with inflammation and stasis dermatitis. He has been wrapped for a long period of time at the lymphedema clinic here at [facility] 2. I am concerned based on the ABIs we obtained in the clinic and the absence of peripheral pulses about the possibility of coexistent significant PAD. In my mind this would preclude aggressive wrapping until we can get arterial studies done on this patient which we have ordered. These  should include ABIs TBI's and arterial Dopplers. 3. The patient's wife wraps his legs with wrap cause and ABI and Ace wraps and I have let her continue this for the moment I am not going to be more aggressive with this and I asked her not to wrap this too tight. 4. I could not get a history of claudication out of the patient although I think he walks minimally because of osteoarthritis pain in his knees Electronic Signature(s) Signed: 05/17/2018 4:38:41 PM By: Linton Ham MD Entered By: Linton Ham on 05/17/2018 10:28:53 [name] ([id number]) -------------------------------------------------------------------------------- ROS/PFSH Details Patient Name: [name] Date of Service: 05/17/2018 8:45 AM Medical Record Number: [id number] Patient Account Number: [id number] Date of Birth/Sex: 1930-06-04 (85 y.o. M) Treating RN: Secundino Ginger Primary Care Provider: Fulton Reek Other Clinician: Referring Provider: Quay Burow Treating Provider/Extender: Tito Dine in Treatment: 0 Information Obtained From Patient Other: wife Wound History Do you currently have one or more open woundso Yes Approximately how long have you had your woundso 6 months How have you been treating your wound(s) until nowo ointment and drsg Has your wound(s) ever healed and then re-openedo No Have you had any lab work done in the past montho No Have you tested positive for an antibiotic resistant organism (MRSA, VRE)o No Have you tested positive for osteomyelitis (bone infection)o No Have you had any tests for circulation on your legso No Eyes Complaints and Symptoms: Positive for: Glasses / Contacts Negative for: Dry Eyes; Vision Changes Medical History: Negative for: Cataracts; Glaucoma; Optic  Neuritis Ear/Nose/Mouth/Throat Complaints and Symptoms: Negative for: Difficult clearing ears; Sinusitis Medical History: Negative for: Chronic sinus problems/congestion; Middle ear  problems Past Medical History Notes: hoh Hematologic/Lymphatic Complaints and Symptoms: Negative for: Bleeding / Clotting Disorders; Human Immunodeficiency Virus Medical History: Positive for: Lymphedema Negative for: Anemia; Hemophilia; Human Immunodeficiency Virus; Sickle Cell Disease Respiratory Complaints and Symptoms: Negative for: Chronic or frequent coughs; Shortness of Breath Medical History: Negative for: Aspiration; Asthma; Chronic Obstructive Pulmonary Disease (COPD); Pneumothorax; Sleep Apnea; Tuberculosis BERTRAN, ZEIMET ([id number]) Cardiovascular Complaints and Symptoms: Negative for: Chest pain Medical History: Positive for: Coronary Artery Disease; Hypertension Negative for: Angina; Arrhythmia; Congestive Heart Failure; Deep Vein Thrombosis; Hypotension; Myocardial Infarction; Peripheral Arterial Disease; Peripheral Venous Disease; Phlebitis; Vasculitis Gastrointestinal Complaints and Symptoms: Negative for: Frequent diarrhea; Nausea; Vomiting Medical History: Negative for: Cirrhosis ; Colitis; Crohnos; Hepatitis A; Hepatitis B; Hepatitis C Endocrine Complaints and Symptoms: Negative for: Hepatitis; Thyroid disease; Polydypsia (Excessive Thirst) Medical History: Negative for: Type I Diabetes; Type II Diabetes Genitourinary Complaints and Symptoms: Negative for: Kidney failure/ Dialysis; Incontinence/dribbling Medical History: Negative for: End Stage Renal Disease Immunological Complaints and Symptoms: Negative for: Hives; Itching Medical History: Negative for: Lupus Erythematosus; Raynaudos; Scleroderma Integumentary (Skin) Complaints and Symptoms: Positive for: Swelling - LEB Medical History: Negative for: History of Burn; History of pressure wounds Musculoskeletal Complaints and Symptoms: Negative for: Muscle Pain; Muscle Weakness Medical History: Negative for: Gout; Rheumatoid Arthritis; Osteoarthritis; Osteomyelitis Neurologic EILAM, [place] ([id number]) Complaints and Symptoms: Negative for: Numbness/parasthesias; Focal/Weakness Medical History: Negative for: Dementia; Neuropathy; Quadriplegia; Paraplegia; Seizure Disorder Oncologic Medical History: Negative for: Received Chemotherapy; Received Radiation Immunizations Pneumococcal Vaccine: Received Pneumococcal Vaccination: Yes Tetanus Vaccine: Last tetanus shot: 01/02/2018 Implantable Devices Family and Social History Cancer: Yes - Mother; Diabetes: Yes - Siblings; Heart Disease: Yes - Mother; Hereditary Spherocytosis: No; Hypertension: No; Kidney Disease: No; Lung Disease: No; Seizures: No; Stroke: No; Thyroid Problems: No; Tuberculosis: No; Former smoker; Alcohol Use: Moderate; Drug Use: No History; Caffeine Use: Daily; Financial Concerns: No; Food, Clothing or Shelter Needs: No; Support System Lacking: No; Transportation Concerns: No; Advanced Directives: No; Patient does not want information on Advanced Directives; Do not resuscitate: No; Living Will: No; Medical Power of Attorney: No Electronic Signature(s) Signed: 05/17/2018 9:53:12 AM By: Secundino Ginger Signed: 05/17/2018 4:38:41 PM By: Linton Ham MD Entered By: Secundino Ginger on 05/17/2018 09:15:55 [name] ([id number]) -------------------------------------------------------------------------------- SuperBill Details Patient Name: [name] Date of Service: 05/17/2018 Medical Record Number: [id number] Patient Account Number: [id number] Date of Birth/Sex: [birth year] (85 y.o. 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, left leg Facility Procedures CPT4 Code: 02409735 Description: 99213 - WOUND CARE VISIT-LEV 3 EST PT Modifier: Quantity: 1 CPT4 Code: 32992426 Description: 11042 - DEB SUBQ TISSUE 20 SQ CM/< ICD-10 Diagnosis Description L97.228 Non-pressure chronic ulcer of left calf with other specified sev Modifier: erity Quantity: 1 Physician Procedures CPT4 Code Description: 8341962 WC PHYS LEVEL 3 o NEW PT ICD-10 Diagnosis Description L97.228 Non-pressure chronic ulcer of left calf with other specified sever I89.0 Lymphedema, not elsewhere classified I70.212 Atherosclerosis of native arteries of  extremities with intermitten Modifier: 25 ity t claudication, l Quantity: 1 eft leg CPT4 Code Description: 2297989 21194 - WC PHYS  SUBQ TISS 20 SQ CM ICD-10 Diagnosis Description L97.228 Non-pressure chronic ulcer of left calf with other specified sever Modifier: ity Quantity: 1 Electronic Signature(s) Signed: 05/17/2018 4:38:41 PM By: Linton Ham MD Entered By: Linton Ham on 05/17/2018 10:29:58

## 2018-05-18 NOTE — Progress Notes (Addendum)
DAMIEAN, LUKES ([id number]) Visit Report for 05/17/2018 Abuse/Suicide Risk Screen Details Patient Name: Steven Meza, Steven Meza Date of Service: 05/17/2018 8:45 AM Medical Record Number: [id number] Patient Account Number: [id number] Date of Birth/Sex: 1931/04/02 (85 y.o. M) Treating RN: Secundino Ginger Primary Care Martasia Talamante: Fulton Reek Other Clinician: Referring Marnie Fazzino: Quay Burow Treating Kazzandra Desaulniers/Extender: Tito Dine in Treatment: 0 Abuse/Suicide Risk Screen Items Answer ABUSE/SUICIDE RISK SCREEN: Has anyone close to you tried to hurt or harm you recentlyo No Do you feel uncomfortable with anyone in your familyo No Has anyone forced you do things that you didnot want to doo No Do you have any thoughts of harming yourselfo No Patient displays signs or symptoms of abuse and/or neglect. No Electronic Signature(s) Signed: 05/17/2018 9:53:12 AM By: Secundino Ginger Entered By: Secundino Ginger on 05/17/2018 09:16:07 [name] ([id number]) -------------------------------------------------------------------------------- Activities of Daily Living Details Patient Name: [name] Date of Service: 05/17/2018 8:45 AM Medical Record Number: [id number] Patient Account Number: [id number] Date of Birth/Sex: 10/18/1930 (85 y.o. M) Treating RN: Secundino Ginger Primary Care Elizjah Noblet: Fulton Reek Other Clinician: Referring Rayson Rando: Quay Burow Treating Kaede Clendenen/Extender: Tito Dine in Treatment: 0 Activities of Daily Living Items Answer Activities of Daily Living (Please select one for each item) Drive Automobile Completely Able Take Medications Completely Able Use Telephone Completely Able Care for Appearance Completely Able Use Toilet Completely Able Bath / Shower Completely Able Dress Self Completely Able Feed Self Completely Able Walk Completely Able Get In / Out Bed Completely Able Housework Completely Able Prepare Steven Meza Completely [place] for Self Completely Able Electronic Signature(s) Signed: 05/17/2018 9:53:12 AM By: Secundino Ginger Entered By: Secundino Ginger on 05/17/2018 09:16:47 [name] ([id number]) -------------------------------------------------------------------------------- Education Assessment Details Patient Name: [name] Date of Service: 05/17/2018 8:45 AM Medical Record Number: [id number] Patient Account Number: [id number] Date of Birth/Sex: 1930/09/07 (85 y.o. M) Treating RN: Secundino Ginger Primary Care Shenna Brissette: Fulton Reek Other Clinician: Referring Leathie Weich: Quay Burow Treating Larico Dimock/Extender: Tito Dine in Treatment: 0 Learning Preferences/Education Level/Primary Language Learning Preference: Explanation, Demonstration Highest Education Level: College or Above Preferred Language: English Cognitive Barrier Assessment/Beliefs Language Barrier: No Translator Needed: No Memory Deficit: No Emotional Barrier: No Cultural/Religious Beliefs Affecting Medical Care: No Physical Barrier Assessment Impaired Vision: No Impaired Hearing: Yes Hearing Aid, hoh bilat Knowledge/Comprehension Assessment Knowledge Level: High Comprehension Level: High Ability to understand written High instructions: Ability to understand verbal High instructions: Motivation Assessment Anxiety Level: Calm Cooperation: Cooperative Education Importance: Acknowledges Need Interest in Health Problems: Asks Questions Perception: Coherent Willingness to Engage in Self- High Management Activities: Readiness to Engage in Self- High Management Activities: Electronic Signature(s) Signed: 05/17/2018 9:53:12 AM By: Secundino Ginger Entered By: Secundino Ginger on 05/17/2018 09:20:38 [name] ([id number]) -------------------------------------------------------------------------------- Fall Risk Assessment Details Patient Name: [name] Date of Service: 05/17/2018 8:45 AM Medical  Record Number: [id number] Patient Account Number: [id number] Date of Birth/Sex: [birth year] (85 y.o. M) Treating RN: Secundino Ginger Primary Care Shawni Volkov: Fulton Reek Other Clinician: Referring Valente Fosberg: Quay Burow Treating Carisha Kantor/Extender: Tito Dine in Treatment: 0 Fall Risk Assessment Items Have you had 2 or more falls in the last 12 monthso 0 No Have you had any fall that resulted in injury in the last 12 monthso 0 No FALL RISK ASSESSMENT: History of falling - immediate or within 3 months 0 No Secondary diagnosis 0 No Ambulatory aid None/bed rest/wheelchair/nurse 0 No Crutches/cane/walker 0 No Furniture 0 No IV Access/Saline  Lock 0 No Gait/Training Normal/bed rest/immobile 0 No Weak 0 No Impaired 0 No Mental Status Oriented to own ability 0 No Electronic Signature(s) Signed: 05/17/2018 9:53:12 AM By: Secundino Ginger Entered By: Secundino Ginger on 05/17/2018 09:17:38 [name] ([id number]) -------------------------------------------------------------------------------- Foot Assessment Details Patient Name: [name] Date of Service: 05/17/2018 8:45 AM Medical Record Number: [id number] Patient Account Number: [id number] Date of Birth/Sex: 1931-04-14 (85 y.o. M) Treating RN: Montey Hora Primary Care Arlyss Weathersby: Fulton Reek Other Clinician: Referring Jenine Krisher: Quay Burow Treating Arienna Benegas/Extender: Tito Dine in Treatment: 0 Foot Assessment Items Site Locations + = Sensation present, - = Sensation absent, C = Callus, U = Ulcer R = Redness, W = Warmth, M = Maceration, PU = Pre-ulcerative lesion F = Fissure, S = Swelling, D = Dryness Assessment Right: Left: Other Deformity: No No Prior Foot Ulcer: No No Prior Amputation: No No Charcot Joint: No No Ambulatory Status: Ambulatory With Help Assistance Device: Walker Gait: Administrator, arts) Signed: 05/17/2018 9:15:51 AM By: Montey Hora Entered By: Montey Hora on  05/17/2018 09:15:50 [name] ([id number]) -------------------------------------------------------------------------------- Nutrition Risk Assessment Details Patient Name: [name] Date of Service: 05/17/2018 8:45 AM Medical Record Number: [id number] Patient Account Number: [id number] Date of Birth/Sex: [birth year] (85 y.o. M) Treating RN: Secundino Ginger Primary Care Chadwin Fury: Fulton Reek Other Clinician: Referring Amanda Steuart: Quay Burow Treating Leonie Amacher/Extender: Tito Dine in Treatment: 0 Height (in): 71 Weight (lbs): 252 Body Mass Index (BMI): 35.1 Nutrition Risk Assessment Items NUTRITION RISK SCREEN: I have an illness or condition that made me change the kind and/or amount of 0 No food I eat I eat fewer than two Steven Meza per day 0 No I eat few fruits and vegetables, or milk products 0 No I have three or more drinks of beer, liquor or wine almost every day 0 No I have tooth or mouth problems that make it hard for me to eat 0 No I don't always have enough money to buy the food I need 0 No I eat alone most of the time 0 No I take three or more different prescribed or over-the-counter drugs a day 0 No Without wanting to, I have lost or gained 10 pounds in the last six months 0 No I am not always physically able to shop, cook and/or feed myself 0 No Nutrition Protocols Good Risk Protocol Moderate Risk Protocol Electronic Signature(s) Signed: 05/17/2018 9:53:12 AM By: Secundino Ginger Entered By: Secundino Ginger on 05/17/2018 09:17:52

## 2018-05-18 NOTE — Therapy (Signed)
[place] [facility] SERVICES [address] [place], [place], [address] Phone: [phone]   Fax:  [phone]  Occupational Therapy Treatment  Patient Details  Name: Steven Meza. MRN: [id number] Date of Birth: [birth year] Referring Provider (OT): Felipa Furnace, MD   Encounter Date: 05/16/2018    Past Medical History:  Diagnosis Date  . Cellulitis   . HBP (high blood pressure)   . Hearing loss   . Sleep apnea   . Stroke [facility])     Past Surgical History:  Procedure Laterality Date  . BLADDER SURGERY    . KNEE SURGERY Right     There were no vitals filed for this visit.                OT Treatments/Exercises (OP) - 05/18/18 0001      ADLs   ADL Comments  Cont Pt edu  for LE self care throughout session    ADL Education Given  Yes      Manual Therapy   Manual Therapy  Edema management;Manual Lymphatic Drainage (MLD);Compression Bandaging    Manual therapy comments  skin care to BLE w/ low ph Eucerin lotion before reapplying compression wraps.     Edema Management  Non-stick pad with perscribed oitment applied to wounds at L lateral leg . Provided skin care to LLE  below knee during MLD uasing low ph, skin grade castor oil    Manual Lymphatic Drainage (MLD)  MLD to LLE  using functional pathways, includng short neck sequence, deep abdominals, functional inguinal LN and full leg sequence.  Care taken in area of LLE wounds to limit any damage to heling tissue.    Compression Bandaging  Multi layer gradient compression wraps applied below both legs as established.                  OT Long Term Goals - 04/20/18 1319      OT LONG TERM GOAL #1   Title  Pt will demonstrate understanding of lymphedema (LE) precautions / prevention principals, including signs / symptoms of cellulitis infection with modified independence using LE Workbook as printed reference to identify 6 precautions without verbal cues by end  of 6th  OT Rx visit.   (Pended)     Baseline  ....................................................  (Pended)     Time  6  (Pended)     Period  Weeks  (Pended)     Status  Achieved  (Pended)       OT LONG TERM GOAL #2   Title  Pt will be able to apply multi-layer short stretch compression wraps using correct gradient techniques with Max CG assistance to achieve optimal limb volume reduction during Intensive Phase CDT, and to return affected limb(s) , as closely as possible, to premorbid size and shape.  (Pended)     Baseline  Dependent  (Pended)     Time  6  (Pended)     Status  Achieved  (Pended)       OT LONG TERM GOAL #3   Title  0   (Pended)     Time  12  (Pended)     Period  Weeks  (Pended)     Status  Partially Met  (Pended)       OT LONG TERM GOAL #4   Title  With moderate  CG assistance Pt will achieve 100% compliance with daily LE self-care home program components, including proper skin care, simple self-MLD, gradient  compression wraps/ garments, and therapeutic exercise to ensure optimal Intensive Phase limb volume reduction to expedite compression garment/ device fitting.  (Pended)     Baseline  Max A  (Pended)     Time  12  (Pended)     Period  Weeks  (Pended)     Status  Achieved  (Pended)       OT LONG TERM GOAL #5   Title  Pt will demonstrate competent use of assistive devices during LE self-care training to improve ability to lift legs onto treatment table, to don and doff shoes and socks, and to don and doff compression garments/devices with Max  assistance to ensure optimal LE self-management over time.  (Pended)     Baseline  Dependent  (Pended)     Time  12  (Pended)     Period  Weeks  (Pended)     Status  Achieved  (Pended)       OT LONG TERM GOAL #6   Title  Pt will retain optimal limb volume reductions achieved during Intensive Phase CDT with no more than 3% volume increase with ongoing CG assistance to limit LE progression and further functional decline.   (Pended)     Baseline  Dependent  (Pended)     Time  6  (Pended)     Period  Months  (Pended)     Status  Partially Met  (Pended)             Plan - 05/18/18 0958    Clinical Impression Statement  Pt presenting with increased LLE swelling, redness and increased cskin dryness and obblestone-like texture on lower legs. Spouse continues to apply gradient compression wraps bilaterally, but since Pt does not always allow this leg swelling is increasing . Pt reports his daily compliance with elevation and lymphatic pumping ther ex is also decreased to 0. Pt has been unsuccessful with standard, circular knit, off-the-shelf elastic comression knee highs as a custom size is needed  for optimal management. Todate Pt and spouse Pt and spouse have refused purchase of custom garments and adjustable wrap system (CircAid). Pt and spouse confirmed understanding that without daily compression for optimal LE management clinical gains will not be retained and condition of legs will decline, as will function. Spouse declines providing assistance with donning/ doffing daytime compression garments / devices. Wraps are applied intermittantly. Pt also understands that further wound healing for existing non-healing leg wounds is  is doubtful without compression and impecable skin care. Pt has vascular consult in a day or so. Our last OT visit for LE care is scheduled for 1/16. DC is pending.    Occupational performance deficits (Please refer to evaluation for details):  ADL's;IADL's;Leisure;Rest and Sleep;Social Participation;Other   productive activities, ambulation and functional mobility   Rehab Potential  Fair    OT Frequency  3x / week    OT Duration  12 weeks    OT Treatment/Interventions  Self-care/ADL training;Therapeutic exercise;DME and/or AE instruction;Therapist, nutritional;Compression bandaging;Other (comment);Manual Therapy;Patient/family education;Therapeutic activities;Manual lymph drainage;Energy  conservation   skin care w/ low ph lotion and castor oil ([place])   Clinical Decision Making  Multiple treatment options, significant modification of task necessary    OT Home Exercise Plan  Juzo, "Dynamic", A-D, ccl 2 ( 30-40 mmHg) circular knit , elastic compression garments- open toe and silicone top band    Consulted and Agree with Plan of Care  Patient;Family member/caregiver    Family Member Consulted  spouse -  [name]       Patient will benefit from skilled therapeutic intervention in order to improve the following deficits and impairments:  Abnormal gait, Decreased balance, Decreased endurance, Decreased mobility, Decreased skin integrity, Difficulty walking, Impaired sensation, Obesity, Pain, Impaired perceived functional ability, Decreased knowledge of precautions, Decreased range of motion, Increased edema, Decreased knowledge of use of DME, Impaired flexibility  Visit Diagnosis: Lymphedema, not elsewhere classified - Plan: Ot plan of care cert/re-cert    Problem List Patient Active Problem List   Diagnosis Date Noted  . Cellulitis 04/19/2018  . COPD (chronic obstructive pulmonary disease) ([place]) 04/19/2018  . Diverticulosis 04/19/2018  . History of colonic polyps 04/19/2018  . Hypertension 04/19/2018  . Morbid obesity ([place]) 04/19/2018  . Osteoarthritis 04/19/2018  . Sleep apnea 04/19/2018  . Stroke ([place]) 04/19/2018  . Tobacco abuse 04/19/2018  . Venous stasis 04/19/2018  . History of bladder cancer 02/20/2013  . Increased frequency of urination 02/20/2013  . Malignant neoplasm of other specified sites of bladder 02/20/2013  . Microscopic hematuria 02/20/2013    Andrey Spearman, MS, OTR/L, [facility] 05/18/18 10:25 AM  [place] [facility] SERVICES [address] [place], [place], [address] Phone: [phone]   Fax:  [phone]  Name: Muhanad Torosyan. MRN: [id number] Date of Birth: 1931/03/04

## 2018-05-23 ENCOUNTER — Ambulatory Visit: Payer: Medicare Other | Admitting: Occupational Therapy

## 2018-05-24 ENCOUNTER — Ambulatory Visit
Admission: RE | Admit: 2018-05-24 | Discharge: 2018-05-24 | Disposition: A | Discharge status: Home / Self Care | Payer: Medicare Other | Source: Ambulatory Visit | Attending: Internal Medicine | Admitting: Internal Medicine

## 2018-05-24 ENCOUNTER — Encounter: Payer: Medicare Other | Admitting: Internal Medicine

## 2018-05-24 DIAGNOSIS — S81802A Unspecified open wound, left lower leg, initial encounter: Secondary | ICD-10-CM

## 2018-05-24 DIAGNOSIS — S81801A Unspecified open wound, right lower leg, initial encounter: Secondary | ICD-10-CM | POA: Insufficient documentation

## 2018-05-24 DIAGNOSIS — L97229 Non-pressure chronic ulcer of left calf with unspecified severity: Secondary | ICD-10-CM | POA: Diagnosis not present

## 2018-05-26 NOTE — Progress Notes (Signed)
Hancock, WENZLICK ([id number]) Visit Report for 05/24/2018 Arrival Information Details Patient Name: Steven Meza, Steven Meza Date of Service: 05/24/2018 1:45 PM Medical Record Number: [id number] Patient Account Number: [id number] Date of Birth/Sex: 21-Nov-1930 (85 y.o. M) Treating RN: Cornell Barman Primary Care Evart Mcdonnell: Fulton Reek Other Clinician: Referring Ethelwyn Gilbertson: Fulton Reek Treating Maximino Cozzolino/Extender: Tito Dine in Treatment: 1 Visit Information History Since Last Visit Added or deleted any medications: No Patient Arrived: Kasandra Knudsen Any new allergies or adverse reactions: No Arrival Time: 13:52 Had a fall or experienced change in No Accompanied By: wife activities of daily living that may affect Transfer Assistance: None risk of falls: Patient Identification Verified: Yes Signs or symptoms of abuse/neglect since last visito No Secondary Verification Process Completed: Yes Hospitalized since last visit: No Implantable device outside of the clinic excluding No cellular tissue based products placed in the center since last visit: Has Dressing in Place as Prescribed: No Pain Present Now: Yes Notes Patient had vascular studies before coming here so does not have his regular wound dressings on. Electronic Signature(s) Signed: 05/24/2018 4:05:04 PM By: Lorine Bears RCP, RRT, CHT Entered By: Lorine Bears on 05/24/2018 13:56:22 Steven Meza ([id number]) -------------------------------------------------------------------------------- Clinic Level of Care Assessment Details Patient Name: Steven Meza Date of Service: 05/24/2018 1:45 PM Medical Record Number: [id number] Patient Account Number: [id number] Date of Birth/Sex: 04-24-1931 (85 y.o. M) Treating RN: Cornell Barman Primary Care Michel Eskelson: Fulton Reek Other Clinician: Referring Myasia Sinatra: Fulton Reek Treating Stetson Pelaez/Extender: Tito Dine in Treatment: 1 Clinic Level  of Care Assessment Items TOOL 4 Quantity Score []  - Use when only an EandM is performed on FOLLOW-UP visit 0 ASSESSMENTS - Nursing Assessment / Reassessment X - Reassessment of Co-morbidities (includes updates in patient status) 1 10 X- 1 5 Reassessment of Adherence to Treatment Plan ASSESSMENTS - Wound and Skin Assessment / Reassessment X - Simple Wound Assessment / Reassessment - one wound 1 5 []  - 0 Complex Wound Assessment / Reassessment - multiple wounds []  - 0 Dermatologic / Skin Assessment (not related to wound area) ASSESSMENTS - Focused Assessment []  - Circumferential Edema Measurements - multi extremities 0 []  - 0 Nutritional Assessment / Counseling / Intervention []  - 0 Lower Extremity Assessment (monofilament, tuning fork, pulses) []  - 0 Peripheral Arterial Disease Assessment (using hand held doppler) ASSESSMENTS - Ostomy and/or Continence Assessment and Care []  - Incontinence Assessment and Management 0 []  - 0 Ostomy Care Assessment and Management (repouching, etc.) PROCESS - Coordination of Care X - Simple Patient / Family Education for ongoing care 1 15 []  - 0 Complex (extensive) Patient / Family Education for ongoing care []  - 0 Staff obtains Programmer, systems, Records, Test Results / Process Orders []  - 0 Staff telephones HHA, Nursing Homes / Clarify orders / etc []  - 0 Routine Transfer to another Facility (non-emergent condition) []  - 0 Routine Hospital Admission (non-emergent condition) []  - 0 New Admissions / Biomedical engineer / Ordering NPWT, Apligraf, etc. []  - 0 Emergency Hospital Admission (emergent condition) X- 1 10 Simple Discharge Coordination Steven Meza, Steven Meza ([id number]) []  - 0 Complex (extensive) Discharge Coordination PROCESS - Special Needs []  - Pediatric / Minor Patient Management 0 []  - 0 Isolation Patient Management []  - 0 Hearing / Language / Visual special needs []  - 0 Assessment of Community assistance (transportation, D/C  planning, etc.) []  - 0 Additional assistance / Altered mentation []  - 0 Support Surface(s) Assessment (bed, cushion, seat, etc.) INTERVENTIONS - Wound Cleansing / Measurement X -  Simple Wound Cleansing - one wound 1 5 []  - 0 Complex Wound Cleansing - multiple wounds X- 1 5 Wound Imaging (photographs - any number of wounds) []  - 0 Wound Tracing (instead of photographs) X- 1 5 Simple Wound Measurement - one wound []  - 0 Complex Wound Measurement - multiple wounds INTERVENTIONS - Wound Dressings []  - Small Wound Dressing one or multiple wounds 0 X- 1 15 Medium Wound Dressing one or multiple wounds []  - 0 Large Wound Dressing one or multiple wounds []  - 0 Application of Medications - topical []  - 0 Application of Medications - injection INTERVENTIONS - Miscellaneous []  - External ear exam 0 []  - 0 Specimen Collection (cultures, biopsies, blood, body fluids, etc.) []  - 0 Specimen(s) / Culture(s) sent or taken to Lab for analysis []  - 0 Patient Transfer (multiple staff / Civil Service fast streamer / Similar devices) []  - 0 Simple Staple / Suture removal (25 or less) []  - 0 Complex Staple / Suture removal (26 or more) []  - 0 Hypo / Hyperglycemic Management (close monitor of Blood Glucose) []  - 0 Ankle / Brachial Index (ABI) - do not check if billed separately X- 1 5 Vital Signs Steven Meza, Steven Meza ([id number]) Has the patient been seen at the hospital within the last three years: Yes Total Score: 80 Level Of Care: New/Established - Level 3 Electronic Signature(s) Signed: 05/25/2018 5:43:44 PM By: Gretta Cool, BSN, RN, CWS, Kim RN, BSN Entered By: Gretta Cool, BSN, RN, CWS, Kim on 05/25/2018 09:06:57 Steven Meza ([id number]) -------------------------------------------------------------------------------- Encounter Discharge Information Details Patient Name: Steven Meza Date of Service: 05/24/2018 1:45 PM Medical Record Number: [id number] Patient Account Number: [id number] Date of  Birth/Sex: 10/31/1930 (85 y.o. M) Treating RN: Montey Hora Primary Care Shemeca Lukasik: Fulton Reek Other Clinician: Referring Natelie Ostrosky: Fulton Reek Treating Syd Newsome/Extender: Tito Dine in Treatment: 1 Encounter Discharge Information Items Discharge Condition: Stable Ambulatory Status: Wheelchair Discharge Destination: Home Transportation: Private Auto Accompanied By: spouse Schedule Follow-up Appointment: Yes Clinical Summary of Care: Electronic Signature(s) Signed: 05/24/2018 3:26:24 PM By: Montey Hora Entered By: Montey Hora on 05/24/2018 15:26:23 Steven Meza ([id number]) -------------------------------------------------------------------------------- Lower Extremity Assessment Details Patient Name: Steven Meza Date of Service: 05/24/2018 1:45 PM Medical Record Number: [id number] Patient Account Number: [id number] Date of Birth/Sex: 1930/11/29 (85 y.o. M) Treating RN: Secundino Ginger Primary Care Exilda Wilhite: Fulton Reek Other Clinician: Referring Bronsen Serano: Fulton Reek Treating Smith Potenza/Extender: Tito Dine in Treatment: 1 Edema Assessment Assessed: [Left: No] [Right: No] [Left: Edema] [Right: :] Calf Left: Right: Point of Measurement: 36 cm From Medial Instep 45 cm cm Ankle Left: Right: Point of Measurement: 10 cm From Medial Instep 29 cm cm Vascular Assessment Claudication: Claudication Assessment [Left:None] Pulses: Dorsalis Pedis Palpable: [Left:Yes] Posterior Tibial Extremity colors, hair growth, and conditions: Extremity Color: [Left:Dusky] Hair Growth on Extremity: [Left:No] Temperature of Extremity: [Left:Warm] Capillary Refill: [Left:< 3 seconds] Toe Nail Assessment Left: Right: Thick: Yes Discolored: No Deformed: No Improper Length and Hygiene: No Electronic Signature(s) Signed: 05/24/2018 4:29:26 PM By: Secundino Ginger Entered By: Secundino Ginger on 05/24/2018 14:11:07 Steven Meza  ([id number]) -------------------------------------------------------------------------------- Multi Wound Chart Details Patient Name: Steven Meza Date of Service: 05/24/2018 1:45 PM Medical Record Number: [id number] Patient Account Number: [id number] Date of Birth/Sex: 02-Oct-1930 (85 y.o. M) Treating RN: Cornell Barman Primary Care Saba Gomm: Fulton Reek Other Clinician: Referring Diogo Anne: Fulton Reek Treating Wandell Scullion/Extender: Tito Dine in Treatment: 1 Vital Signs Height(in): 71 Pulse(bpm): 57 Weight(lbs): 252 Blood Pressure(mmHg): 149/52 Body Mass Index(BMI): 35  Temperature(F): 98.1 Respiratory Rate 16 (breaths/min): Photos: [N/A:N/A] Wound Location: Left Lower Leg - Lateral N/A N/A Wounding Event: Gradually Appeared N/A N/A Primary Etiology: Lymphedema N/A N/A Comorbid History: Lymphedema, Coronary Artery N/A N/A Disease, Hypertension Date Acquired: 11/14/2017 N/A N/A Weeks of Treatment: 1 N/A N/A Wound Status: Open N/A N/A Clustered Wound: Yes N/A N/A Clustered Quantity: 2 N/A N/A Measurements L x W x D 1.4x1.4x0.1 N/A N/A (cm) Area (cm) : 1.539 N/A N/A Volume (cm) : 0.154 N/A N/A % Reduction in Area: 51.00% N/A N/A % Reduction in Volume: 51.00% N/A N/A Classification: Full Thickness Without N/A N/A Exposed Support Structures Exudate Amount: Medium N/A N/A Exudate Type: Serous N/A N/A Exudate Color: amber N/A N/A Wound Margin: Flat and Intact N/A N/A Granulation Amount: Small (1-33%) N/A N/A Granulation Quality: Pink N/A N/A Necrotic Amount: Large (67-100%) N/A N/A Exposed Structures: Fat Layer (Subcutaneous N/A N/A Tissue) Exposed: Yes Fascia: No Tendon: No Steven Meza, Steven Meza ([id number]) Muscle: No Joint: No Bone: No Epithelialization: None N/A N/A Periwound Skin Texture: Excoriation: No N/A N/A Induration: No Callus: No Crepitus: No Rash: No Scarring: No Periwound Skin Moisture: Maceration: No N/A N/A Dry/Scaly:  No Periwound Skin Color: Hemosiderin Staining: Yes N/A N/A Atrophie Blanche: No Cyanosis: No Ecchymosis: No Erythema: No Mottled: No Pallor: No Rubor: No Temperature: No Abnormality N/A N/A Tenderness on Palpation: No N/A N/A Wound Preparation: Ulcer Cleansing: N/A N/A Rinsed/Irrigated with Saline Topical Anesthetic Applied: Other: lidocaine 4% Treatment Notes Wound #1 (Left, Lateral Lower Leg) Notes prisma, ABD and conform Electronic Signature(s) Signed: 05/24/2018 5:53:07 PM By: Linton Ham MD Entered By: Linton Ham on 05/24/2018 15:37:45 Steven Meza ([id number]) -------------------------------------------------------------------------------- Multi-Disciplinary Care Plan Details Patient Name: Steven Meza Date of Service: 05/24/2018 1:45 PM Medical Record Number: [id number] Patient Account Number: [id number] Date of Birth/Sex: [birth year] (85 y.o. M) Treating RN: Cornell Barman Primary Care Tonji Elliff: Fulton Reek Other Clinician: Referring Chidi Shirer: Fulton Reek Treating Furqan Gosselin/Extender: Tito Dine in Treatment: 1 Active Inactive Orientation to the Wound Care Program Nursing Diagnoses: Knowledge deficit related to the wound healing center program Goals: Patient/caregiver will verbalize understanding of the [place] Program Date Initiated: 05/17/2018 Target Resolution Date: 06/17/2018 Goal Status: Active Interventions: Provide education on orientation to the wound center Notes: Venous Leg Ulcer Nursing Diagnoses: Potential for venous Insuffiency (use before diagnosis confirmed) Goals: Patient/caregiver will verbalize understanding of disease process and disease management Date Initiated: 05/17/2018 Target Resolution Date: 06/17/2018 Goal Status: Active Interventions: Assess peripheral edema status every visit. Treatment Activities: Non-invasive vascular studies : 05/17/2018 Notes: Wound/Skin Impairment Nursing  Diagnoses: Impaired tissue integrity Goals: Patient/caregiver will verbalize understanding of skin care regimen Date Initiated: 05/17/2018 Target Resolution Date: 05/17/2018 Goal Status: Active Steven Meza, Steven Meza ([id number]) Ulcer/skin breakdown will heal within 14 weeks Date Initiated: 05/17/2018 Target Resolution Date: 08/16/2018 Goal Status: Active Interventions: Assess ulceration(s) every visit Treatment Activities: Referred to DME Lyndol Vanderheiden for dressing supplies : 05/17/2018 Topical wound management initiated : 05/17/2018 Notes: Electronic Signature(s) Signed: 05/25/2018 5:43:44 PM By: Gretta Cool, BSN, RN, CWS, Kim RN, BSN Entered By: Gretta Cool, BSN, RN, CWS, Kim on 05/24/2018 14:37:21 Steven Meza ([id number]) -------------------------------------------------------------------------------- Pain Assessment Details Patient Name: Steven Meza Date of Service: 05/24/2018 1:45 PM Medical Record Number: [id number] Patient Account Number: [id number] Date of Birth/Sex: 1930-07-31 (85 y.o. M) Treating RN: Cornell Barman Primary Care Natasha Burda: Fulton Reek Other Clinician: Referring Arrow Tomko: Fulton Reek Treating Elizabethann Lackey/Extender: Tito Dine in Treatment: 1 Active Problems Location of Pain Severity and Description  of Pain Patient Has Paino Yes Site Locations Rate the pain. Current Pain Level: 1 Pain Management and Medication Current Pain Management: Electronic Signature(s) Signed: 05/24/2018 4:05:04 PM By: Lorine Bears RCP, RRT, CHT Signed: 05/25/2018 5:43:44 PM By: Gretta Cool, BSN, RN, CWS, Kim RN, BSN Entered By: Lorine Bears on 05/24/2018 13:56:38 Steven Meza ([id number]) -------------------------------------------------------------------------------- Patient/Caregiver Education Details Patient Name: Steven Meza Date of Service: 05/24/2018 1:45 PM Medical Record Number: [id number] Patient Account Number: [id number] Date of  Birth/Gender: 05/04/1930 (85 y.o. M) Treating RN: Cornell Barman Primary Care Physician: Fulton Reek Other Clinician: Referring Physician: Fulton Reek Treating Physician/Extender: Tito Dine in Treatment: 1 Education Assessment Education Provided To: Patient and Caregiver Education Topics Provided Wound/Skin Impairment: Handouts: Caring for Your Ulcer Methods: Demonstration, Explain/Verbal Responses: State content correctly Electronic Signature(s) Signed: 05/25/2018 5:43:44 PM By: Gretta Cool, BSN, RN, CWS, Kim RN, BSN Entered By: Gretta Cool, BSN, RN, CWS, Kim on 05/24/2018 17:40:05 Steven Meza ([id number]) -------------------------------------------------------------------------------- Wound Assessment Details Patient Name: Steven Meza Date of Service: 05/24/2018 1:45 PM Medical Record Number: [id number] Patient Account Number: [id number] Date of Birth/Sex: Oct 30, 1930 (85 y.o. M) Treating RN: Secundino Ginger Primary Care Yianna Tersigni: Fulton Reek Other Clinician: Referring Renato Spellman: Fulton Reek Treating Revis Whalin/Extender: Tito Dine in Treatment: 1 Wound Status Wound Number: 1 Primary Lymphedema Etiology: Wound Location: Left Lower Leg - Lateral Wound Status: Open Wounding Event: Gradually Appeared Comorbid Lymphedema, Coronary Artery Disease, Date Acquired: 11/14/2017 History: Hypertension Weeks Of Treatment: 1 Clustered Wound: Yes Photos Photo Uploaded By: Montey Hora on 05/24/2018 14:45:06 Wound Measurements Length: (cm) 1.4 % Redu Width: (cm) 1.4 % Redu Depth: (cm) 0.1 Epithe Clustered Quantity: 2 Tunnel Area: (cm) 1.539 Under Volume: (cm) 0.154 ction in Area: 51% ction in Volume: 51% lialization: None ing: No mining: No Wound Description Full Thickness Without Exposed Support Foul O Classification: Structures Slough Wound Margin: Flat and Intact Exudate Medium Amount: Exudate Type: Serous Exudate Color: amber dor After  Cleansing: No /Fibrino Yes Wound Bed Granulation Amount: Small (1-33%) Exposed Structure Granulation Quality: Pink Fascia Exposed: No Necrotic Amount: Large (67-100%) Fat Layer (Subcutaneous Tissue) Exposed: Yes Necrotic Quality: Adherent Slough Tendon Exposed: No Muscle Exposed: No Joint Exposed: No Steven Meza, Steven Meza ([id number]) Bone Exposed: No Periwound Skin Texture Texture Color No Abnormalities Noted: No No Abnormalities Noted: No Callus: No Atrophie Blanche: No Crepitus: No Cyanosis: No Excoriation: No Ecchymosis: No Induration: No Erythema: No Rash: No Hemosiderin Staining: Yes Scarring: No Mottled: No Pallor: No Moisture Rubor: No No Abnormalities Noted: No Dry / Scaly: No Temperature / Pain Maceration: No Temperature: No Abnormality Wound Preparation Ulcer Cleansing: Rinsed/Irrigated with Saline Topical Anesthetic Applied: Other: lidocaine 4%, Treatment Notes Wound #1 (Left, Lateral Lower Leg) Notes prisma, ABD and conform Electronic Signature(s) Signed: 05/24/2018 4:29:26 PM By: Secundino Ginger Entered By: Secundino Ginger on 05/24/2018 14:09:03 Steven Meza ([id number]) -------------------------------------------------------------------------------- Vitals Details Patient Name: Steven Meza Date of Service: 05/24/2018 1:45 PM Medical Record Number: [id number] Patient Account Number: [id number] Date of Birth/Sex: 1930-12-11 (85 y.o. M) Treating RN: Cornell Barman Primary Care Maxie Debose: Fulton Reek Other Clinician: Referring Marcellous Snarski: Fulton Reek Treating Lygia Olaes/Extender: Tito Dine in Treatment: 1 Vital Signs Time Taken: 13:56 Temperature (F): 98.1 Height (in): 71 Pulse (bpm): 68 Weight (lbs): 252 Respiratory Rate (breaths/min): 16 Body Mass Index (BMI): 35.1 Blood Pressure (mmHg): 149/52 Reference Range: 80 - 120 mg / dl Electronic Signature(s) Signed: 05/24/2018 4:05:04 PM By: Becky Sax, Sallie RCP, RRT,  CHT Entered By:  Becky Sax, Sallie on 05/24/2018 14:01:10

## 2018-05-26 NOTE — Progress Notes (Signed)
BREEZE, ANGELL ([id number]) Visit Report for 05/24/2018 HPI Details Patient Name: Steven Meza, Steven Meza Date of Service: 05/24/2018 1:45 PM Medical Record Number: [id number] Patient Account Number: [id number] Date of Birth/Sex: [birth year] (85 y.o. M) Treating RN: Cornell Barman Primary Care Provider: Fulton Reek Other Clinician: Referring Provider: Fulton Reek Treating Provider/Extender: Tito Dine in Treatment: 1 History of Present Illness HPI Description: ADMISSION 05/17/2018 Mr. Robb is an 85 year old man who is been treated at the lymphedema clinic for a long period with lymphedema wraps for bilateral lower extremity lymphedema. About 6 months ago they noted small open areas on his left lateral calf just above the ankle. These were open. They are apparently putting some form of ointment on this. They have been referred here for our review of this. The patient has a long history of lymphedema and venous stasis. It says in his records that he has recurrent cellulitis although his wife denies this but she states that the lymphedema may have started with cellulitis several years ago. Also in his record there is a history of bladder cancer which they seem to know very little about however he did not have any radiation to the pelvis or anything that could have contributed to lymphedema that they are aware of. There is no prior wound history. The patient has 2 small punched out areas on the left lateral calf that have adherent debris on the surface. Past medical history; lymphedema, hypertension, cellulitis, hearing loss, morbid obesity, osteoarthritis, venous stasis, bladder CA, diverticulosis. ABIs in our clinic were 0.36 on the right and 0.57 on the left 05/24/2018; corrected age on this patient is 85 versus what I said last week. He has been for his arterial studies which predictably are not very good. On the right his ABI is 0.65. Monophasic waveforms noted at the right ankle  including the posterior tibial and dorsalis pedis. He has triphasic waveforms of the common femoral and profunda femoris triphasic proximal SFA with monophasic distal FSA and popliteal artery. Monophasic tibial waveforms. On the left his ABI is 0.58 monophasic waveforms at the left ankle. Duplex of the left lower extremity demonstrated atherosclerotic change with monophasic waveforms throughout the left lower extremity. There was some concern about left iliac disease and potentially femoral-popliteal and tibial disease. The patient does not describe claudication although according to his wife he over emphasizes his activity. Patient states he is limited by pain in both knees His wounds are on the left lateral calf. 2 small areas with necrotic debris. We have been using silver collagen and light Ace wraps Electronic Signature(s) Signed: 05/24/2018 5:53:07 PM By: Linton Ham MD Entered By: Linton Ham on 05/24/2018 15:42:59 Steven Meza ([id number]) -------------------------------------------------------------------------------- Physical Exam Details Patient Name: Steven Meza Date of Service: 05/24/2018 1:45 PM Medical Record Number: [id number] Patient Account Number: [id number] Date of Birth/Sex: 1931-04-17 (85 y.o. M) Treating RN: Cornell Barman Primary Care Provider: Fulton Reek Other Clinician: Referring Provider: Fulton Reek Treating Provider/Extender: Tito Dine in Treatment: 1 Constitutional Sitting or standing Blood Pressure is within target range for patient.. Pulse regular and within target range for patient.Marland Kitchen Respirations regular, non-labored and within target range.. Temperature is normal and within the target range for the patient.Marland Kitchen appears in no distress. Cardiovascular I cannot feel a femoral pulse on the left. Pedal pulses absent bilaterally.. Notes Wound exam; the patient had 2 small punched out areas on the left calf. He definitely has  severe lymphedema and chronic venous insufficiency however I cannot feel  any pulses in the left leg at all. Using a #3 curette I removed necrotic debris from the surface of these wounds. There is weeping edema fluid Electronic Signature(s) Signed: 05/24/2018 5:53:07 PM By: Linton Ham MD Entered By: Linton Ham on 05/24/2018 15:44:51 Steven Meza ([id number]) -------------------------------------------------------------------------------- Physician Orders Details Patient Name: Steven Meza Date of Service: 05/24/2018 1:45 PM Medical Record Number: [id number] Patient Account Number: [id number] Date of Birth/Sex: December 28, 1930 (85 y.o. M) Treating RN: Cornell Barman Primary Care Provider: Fulton Reek Other Clinician: Referring Provider: Fulton Reek Treating Provider/Extender: Tito Dine in Treatment: 1 Verbal / Phone Orders: No Diagnosis Coding Wound Cleansing Wound #1 Left,Lateral Lower Leg o Clean wound with Normal Saline. Anesthetic (add to Medication List) Wound #1 Left,Lateral Lower Leg o Topical Lidocaine 4% cream applied to wound bed prior to debridement (In Clinic Only). Primary Wound Dressing Wound #1 Left,Lateral Lower Leg o Silver Collagen Secondary Dressing Wound #1 Left,Lateral Lower Leg o ABD and Kerlix/Conform Dressing Change Frequency Wound #1 Left,Lateral Lower Leg o Change Dressing Monday, Wednesday, Friday Follow-up Appointments Wound #1 Left,Lateral Lower Leg o Return Appointment in 1 week. Edema Control Wound #1 Left,Lateral Lower Leg o Other: - Wife to reapply lymphedema wraps bilaterally Consults o Vascular - Dr. Fletcher Anon Electronic Signature(s) Signed: 05/24/2018 5:53:07 PM By: Linton Ham MD Signed: 05/25/2018 5:43:44 PM By: Gretta Cool, BSN, RN, CWS, Kim RN, BSN Entered By: Gretta Cool, BSN, RN, CWS, Kim on 05/24/2018 14:47:11 Steven Meza  ([id number]) -------------------------------------------------------------------------------- Problem List Details Patient Name: Steven Meza Date of Service: 05/24/2018 1:45 PM Medical Record Number: [id number] Patient Account Number: [id number] Date of Birth/Sex: 1930/12/28 (85 y.o. 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 with other specified 05/17/2018 No Yes severity I89.0 Lymphedema, not elsewhere classified 05/17/2018 No Yes I70.212 Atherosclerosis of native arteries of extremities with 05/17/2018 No Yes intermittent claudication, left leg Inactive Problems Resolved Problems Electronic Signature(s) Signed: 05/24/2018 5:53:07 PM By: Linton Ham MD Entered By: Linton Ham on 05/24/2018 15:37:36 Steven Meza ([id number]) -------------------------------------------------------------------------------- Progress Note Details Patient Name: Steven Meza Date of Service: 05/24/2018 1:45 PM Medical Record Number: [id number] Patient Account Number: [id number] Date of Birth/Sex: Sep 13, 1930 (85 y.o. M) Treating RN: Cornell Barman Primary Care Provider: Fulton Reek Other Clinician: Referring Provider: Fulton Reek Treating Provider/Extender: Tito Dine in Treatment: 1 Subjective History of Present Illness (HPI) ADMISSION 05/17/2018 Mr. Ducey is an 85 year old man who is been treated at the lymphedema clinic for a long period with lymphedema wraps for bilateral lower extremity lymphedema. About 6 months ago they noted small open areas on his left lateral calf just above the ankle. These were open. They are apparently putting some form of ointment on this. They have been referred  here for our review of this. The patient has a long history of lymphedema and venous stasis. It says in his records that he has recurrent cellulitis although his wife denies this but she states that the lymphedema may have started with cellulitis several years ago. Also in his record there is a history of bladder cancer which they seem to know very little about however he did not have any radiation to the pelvis or anything that could have contributed to lymphedema that they are aware of. There is no prior wound history.  The patient has 2 small punched out areas on the left lateral calf that have adherent debris on the surface. Past medical history; lymphedema, hypertension, cellulitis, hearing loss, morbid obesity, osteoarthritis, venous stasis, bladder CA, diverticulosis. ABIs in our clinic were 0.36 on the right and 0.57 on the left 05/24/2018; corrected age on this patient is 59 versus what I said last week. He has been for his arterial studies which predictably are not very good. On the right his ABI is 0.65. Monophasic waveforms noted at the right ankle including the posterior tibial and dorsalis pedis. He has triphasic waveforms of the common femoral and profunda femoris triphasic proximal SFA with monophasic distal FSA and popliteal artery. Monophasic tibial waveforms. On the left his ABI is 0.58 monophasic waveforms at the left ankle. Duplex of the left lower extremity demonstrated atherosclerotic change with monophasic waveforms throughout the left lower extremity. There was some concern about left iliac disease and potentially femoral-popliteal and tibial disease. The patient does not describe claudication although according to his wife he over emphasizes his activity. Patient states he is limited by pain in both knees His wounds are on the left lateral calf. 2 small areas with necrotic debris. We have been using silver collagen and light Ace wraps Objective Constitutional Sitting or  standing Blood Pressure is within target range for patient.. Pulse regular and within target range for patient.Marland Kitchen Respirations regular, non-labored and within target range.. Temperature is normal and within the target range for the patient.Marland Kitchen appears in no distress. Vitals Time Taken: 1:56 PM, Height: 71 in, Weight: 252 lbs, BMI: 35.1, Temperature: 98.1 F, Pulse: 68 bpm, Respiratory Steven Meza, Steven A. ([id number]) Rate: 16 breaths/min, Blood Pressure: 149/52 mmHg. Cardiovascular I cannot feel a femoral pulse on the left. Pedal pulses absent bilaterally.. General Notes: Wound exam; the patient had 2 small punched out areas on the left calf. He definitely has severe lymphedema and chronic venous insufficiency however I cannot feel any pulses in the left leg at all. Using a #3 curette I removed necrotic debris from the surface of these wounds. There is weeping edema fluid Integumentary (Hair, Skin) Wound #1 status is Open. Original cause of wound was Gradually Appeared. The wound is located on the Left,Lateral Lower Leg. The wound measures 1.4cm length x 1.4cm width x 0.1cm depth; 1.539cm^2 area and 0.154cm^3 volume. There is Fat Layer (Subcutaneous Tissue) Exposed exposed. There is no tunneling or undermining noted. There is a medium amount of serous drainage noted. The wound margin is flat and intact. There is small (1-33%) pink granulation within the wound bed. There is a large (67-100%) amount of necrotic tissue within the wound bed including Adherent Slough. The periwound skin appearance exhibited: Hemosiderin Staining. The periwound skin appearance did not exhibit: Callus, Crepitus, Excoriation, Induration, Rash, Scarring, Dry/Scaly, Maceration, Atrophie Blanche, Cyanosis, Ecchymosis, Mottled, Pallor, Rubor, Erythema. Periwound temperature was noted as No Abnormality. Assessment Active Problems ICD-10 Non-pressure chronic ulcer of left calf with other specified severity Lymphedema, not  elsewhere classified Atherosclerosis of native arteries of extremities with intermittent claudication, left leg Plan Wound Cleansing: Wound #1 Left,Lateral Lower Leg: Clean wound with Normal Saline. Anesthetic (add to Medication List): Wound #1 Left,Lateral Lower Leg: Topical Lidocaine 4% cream applied to wound bed prior to debridement (In Clinic Only). Primary Wound Dressing: Wound #1 Left,Lateral Lower Leg: Silver Collagen Secondary Dressing: Wound #1 Left,Lateral Lower Leg: ABD and Kerlix/Conform Dressing Change Frequency: Wound #1 Left,Lateral Lower Leg: Change Dressing Monday, Wednesday, Friday Follow-up Appointments: Wound #1 Left,Lateral  Lower Leg: Return Appointment in 1 week. Steven Meza, Steven Meza ([id number]) Edema Control: Wound #1 Left,Lateral Lower Leg: Other: - Wife to reapply lymphedema wraps bilaterally Consults ordered were: Vascular - Dr. Fletcher Anon 1. This is a patient with severe lymphedema and chronic venous insufficiency however he also so probably has severe PAD especially in the left. There is a suggestion here that he has more proximal left iliac disease and potentially femoral-popliteal and tibial disease. He however is not very symptomatic but he may not be active enough. The concern from a pure wound care point of view is the compression wraps. Right now I am only comfortable with a light Ace wrap that his wife is doing. Thankfully this seems to be controlling the majority of his edema at present. 2. From what I understand lymphedema wraps have quite a bit of compression force I am not sure I would be comfortable with this at least until we have him seen by invasive vascular. 3. The patient was previously seen by Dr. Gwenlyn Found in [place]. At that point the etiology of the wounds was felt to be venous and lymphedema. I am going to send him to see Dr. Fletcher Anon locally. He may very well require an angiogram Electronic Signature(s) Signed: 05/24/2018 5:53:07 PM By:  Linton Ham MD Entered By: Linton Ham on 05/24/2018 15:48:06 Steven Meza ([id number]) -------------------------------------------------------------------------------- SuperBill Details Patient Name: Steven Meza Date of Service: 05/24/2018 Medical Record Number: [id number] Patient Account Number: [id number] Date of Birth/Sex: 05-03-1931 (85 y.o. M) Treating RN: Cornell Barman Primary Care Provider: Fulton Reek Other Clinician: Referring Provider: Fulton Reek Treating Provider/Extender: Tito Dine in Treatment: 1 Diagnosis Coding ICD-10 Codes Meza Description [phone] Non-pressure chronic ulcer of left calf with other specified severity I89.0 Lymphedema, not elsewhere classified I70.212 Atherosclerosis of native arteries of extremities with intermittent claudication, left leg Facility Procedures CPT4 Meza: [id number] Description: 99213 - WOUND CARE VISIT-LEV 3 EST PT Modifier: Quantity: 1 Physician Procedures CPT4 Meza Description: [id number] 99213 - WC PHYS LEVEL 3 - EST PT ICD-10 Diagnosis Description 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

## 2018-05-30 ENCOUNTER — Ambulatory Visit: Payer: Medicare Other | Admitting: Occupational Therapy

## 2018-05-31 ENCOUNTER — Encounter: Payer: Medicare Other | Admitting: Internal Medicine

## 2018-05-31 DIAGNOSIS — L97229 Non-pressure chronic ulcer of left calf with unspecified severity: Secondary | ICD-10-CM | POA: Diagnosis not present

## 2018-06-02 NOTE — Progress Notes (Signed)
Steven Meza, Steven Meza ([id number]) Visit Report for 05/31/2018 HPI Details Patient Name: Steven Meza, Steven Meza Date of Service: 05/31/2018 12:30 PM Medical Record Number: [id number] Patient Account Number: [id number] Date of Birth/Sex: 10/17/1930 (85 y.o. Male) Treating RN: Cornell Barman Primary Care Provider: Fulton Reek Other Clinician: Referring Provider: Fulton Reek Treating Provider/Extender: Tito Dine in Treatment: 2 History of Present Illness HPI Description: ADMISSION 05/17/2018 Steven Meza is an 85 year old man who is been treated at the lymphedema clinic for a long period with lymphedema wraps for bilateral lower extremity lymphedema. About 6 months ago they noted small open areas on his left lateral calf just above the ankle. These were open. They are apparently putting some form of ointment on this. They have been referred here for our review of this. The patient has a long history of lymphedema and venous stasis. It says in his records that he has recurrent cellulitis although his wife denies this but she states that the lymphedema may have started with cellulitis several years ago. Also in his record there is a history of bladder cancer which they seem to know very little about however he did not have any radiation to the pelvis or anything that could have contributed to lymphedema that they are aware of. There is no prior wound history. The patient has 2 small punched out areas on the left lateral calf that have adherent debris on the surface. Past medical history; lymphedema, hypertension, cellulitis, hearing loss, morbid obesity, osteoarthritis, venous stasis, bladder CA, diverticulosis. ABIs in our clinic were 0.36 on the right and 0.57 on the left 05/24/2018; corrected age on this patient is 85 versus what I said last week. He has been for his arterial studies which predictably are not very good. On the right his ABI is 0.65. Monophasic waveforms noted at the right  ankle including the posterior tibial and dorsalis pedis. He has triphasic waveforms of the common femoral and profunda femoris triphasic proximal SFA with monophasic distal FSA and popliteal artery. Monophasic tibial waveforms. On the left his ABI is 0.58 monophasic waveforms at the left ankle. Duplex of the left lower extremity demonstrated atherosclerotic change with monophasic waveforms throughout the left lower extremity. There was some concern about left iliac disease and potentially femoral-popliteal and tibial disease. The patient does not describe claudication although according to his wife he over emphasizes his activity. Patient states he is limited by pain in both knees His wounds are on the left lateral calf. 2 small areas with necrotic debris. We have been using silver collagen and light Ace wraps 05/31/2018; wounds on the left lateral calf in the setting of very significant lymphedema and probably PAD. We have been using silver collagen. The base of the small wound looks reasonably improved. I sent him to see Dr. Fletcher Anon however I see now he has an appointment with Dr. Alvester Chou on February 12 Electronic Signature(s) Signed: 06/01/2018 9:49:40 AM By: Linton Ham MD Entered By: Linton Ham on 05/31/2018 13:13:05 Steven Meza ([id number]) -------------------------------------------------------------------------------- Physical Exam Details Patient Name: Steven Meza Date of Service: 05/31/2018 12:30 PM Medical Record Number: [id number] Patient Account Number: [id number] Date of Birth/Sex: [birth year] (85 y.o. Male) Treating RN: Cornell Barman Primary Care Provider: Fulton Reek Other Clinician: Referring Provider: Fulton Reek Treating Provider/Extender: Ricard Dillon Weeks in Treatment: 2 Constitutional Sitting or standing Blood Pressure is within target range for patient.. Pulse regular and within target range for patient.Marland Kitchen Respirations regular, non-labored and  within target range.. Temperature is normal and  within the target range for the patient.Marland Kitchen appears in no distress. Eyes Conjunctivae clear. No discharge. Respiratory Respiratory effort is easy and symmetric bilaterally. Rate is normal at rest and on room air.. Cardiovascular I do not feel a femoral pulse on the left as well. Pedal pulses absent bilaterally.. Very significant lymphedema. Lymphatic None palpable in the popliteal or inguinal area. Psychiatric No evidence of depression, anxiety, or agitation. Calm, cooperative, and communicative. Appropriate interactions and affect.. Notes Wound exam; the patient has 2 small punched out areas on the left calf. He has better looking wound surfaces but not much else is changed. Using silver collagen. The patient's wife is doing lymphedema wraps but at much less compression0 Electronic Signature(s) Signed: 06/01/2018 9:49:40 AM By: Linton Ham MD Entered By: Linton Ham on 05/31/2018 13:14:34 Steven Meza ([id number]) -------------------------------------------------------------------------------- Physician Orders Details Patient Name: Steven Meza Date of Service: 05/31/2018 12:30 PM Medical Record Number: [id number] Patient Account Number: [id number] Date of Birth/Sex: 03/13/1931 (85 y.o. Male) Treating RN: Cornell Barman Primary Care Provider: Fulton Reek Other Clinician: Referring Provider: Fulton Reek Treating Provider/Extender: Tito Dine in Treatment: 2 Verbal / Phone Orders: No Diagnosis Coding Wound Cleansing Wound #1 Left,Lateral Lower Leg o Clean wound with Normal Saline. Anesthetic (add to Medication List) Wound #1 Left,Lateral Lower Leg o Topical Lidocaine 4% cream applied to wound bed prior to debridement (In Clinic Only). Primary Wound Dressing Wound #1 Left,Lateral Lower Leg o Silver Collagen Secondary Dressing Wound #1 Left,Lateral Lower Leg o ABD and Kerlix/Conform Dressing  Change Frequency Wound #1 Left,Lateral Lower Leg o Change Dressing Monday, Wednesday, Friday Follow-up Appointments Wound #1 Left,Lateral Lower Leg o Return Appointment in 1 week. Edema Control Wound #1 Left,Lateral Lower Leg o Other: - Wife to reapply lymphedema wraps bilaterally Electronic Signature(s) Signed: 05/31/2018 6:00:27 PM By: Gretta Cool, BSN, RN, CWS, Kim RN, BSN Signed: 06/01/2018 9:49:40 AM By: Linton Ham MD Entered By: Gretta Cool, BSN, RN, CWS, Kim on 05/31/2018 13:06:36 Steven Meza ([id number]) -------------------------------------------------------------------------------- Problem List Details Patient Name: Steven Meza Date of Service: 05/31/2018 12:30 PM Medical Record Number: [id number] Patient Account Number: [id number] Date of Birth/Sex: 04-16-1931 (85 y.o. 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 with other specified 05/17/2018 No Yes severity I89.0 Lymphedema, not elsewhere classified 05/17/2018 No Yes I70.212 Atherosclerosis of native arteries of extremities with 05/17/2018 No Yes intermittent claudication, left leg Inactive Problems Resolved Problems Electronic Signature(s) Signed: 06/01/2018 9:49:40 AM By: Linton Ham MD Entered By: Linton Ham on 05/31/2018 13:11:58 Steven Meza ([id number]) -------------------------------------------------------------------------------- Progress Note Details Patient Name: Steven Meza Date of Service: 05/31/2018 12:30 PM Medical Record Number: [id number] Patient Account Number: [id number] Date of Birth/Sex: 1931/03/17 (85 y.o. Male) Treating RN: Cornell Barman Primary Care Provider: Fulton Reek Other  Clinician: Referring Provider: Fulton Reek Treating Provider/Extender: Ricard Dillon Weeks in Treatment: 2 Subjective History of Present Illness (HPI) ADMISSION 05/17/2018 Steven Meza is an 85 year old man who is been treated at the lymphedema clinic for a long period with lymphedema wraps for bilateral lower extremity lymphedema. About 6 months ago they noted small open areas on his left lateral calf just above the ankle. These were open. They are apparently putting some form of ointment on this. They have been referred here for our review of this. The  patient has a long history of lymphedema and venous stasis. It says in his records that he has recurrent cellulitis although his wife denies this but she states that the lymphedema may have started with cellulitis several years ago. Also in his record there is a history of bladder cancer which they seem to know very little about however he did not have any radiation to the pelvis or anything that could have contributed to lymphedema that they are aware of. There is no prior wound history. The patient has 2 small punched out areas on the left lateral calf that have adherent debris on the surface. Past medical history; lymphedema, hypertension, cellulitis, hearing loss, morbid obesity, osteoarthritis, venous stasis, bladder CA, diverticulosis. ABIs in our clinic were 0.36 on the right and 0.57 on the left 05/24/2018; corrected age on this patient is 33 versus what I said last week. He has been for his arterial studies which predictably are not very good. On the right his ABI is 0.65. Monophasic waveforms noted at the right ankle including the posterior tibial and dorsalis pedis. He has triphasic waveforms of the common femoral and profunda femoris triphasic proximal SFA with monophasic distal FSA and popliteal artery. Monophasic tibial waveforms. On the left his ABI is 0.58 monophasic waveforms at the left ankle. Duplex of the left lower  extremity demonstrated atherosclerotic change with monophasic waveforms throughout the left lower extremity. There was some concern about left iliac disease and potentially femoral-popliteal and tibial disease. The patient does not describe claudication although according to his wife he over emphasizes his activity. Patient states he is limited by pain in both knees His wounds are on the left lateral calf. 2 small areas with necrotic debris. We have been using silver collagen and light Ace wraps 05/31/2018; wounds on the left lateral calf in the setting of very significant lymphedema and probably PAD. We have been using silver collagen. The base of the small wound looks reasonably improved. I sent him to see Dr. Fletcher Anon however I see now he has an appointment with Dr. Alvester Chou on February 12 Objective Constitutional Sitting or standing Blood Pressure is within target range for patient.. Pulse regular and within target range for patient.Marland Kitchen Respirations regular, non-labored and within target range.. Temperature is normal and within the target range for the patient.Marland Kitchen Steven Meza, Steven Meza ([id number]) appears in no distress. Vitals Time Taken: 12:38 PM, Height: 71 in, Weight: 252 lbs, BMI: 35.1, Temperature: 98.8 F, Pulse: 63 bpm, Respiratory Rate: 16 breaths/min, Blood Pressure: 129/51 mmHg. Eyes Conjunctivae clear. No discharge. Respiratory Respiratory effort is easy and symmetric bilaterally. Rate is normal at rest and on room air.. Cardiovascular I do not feel a femoral pulse on the left as well. Pedal pulses absent bilaterally.. Very significant lymphedema. Lymphatic None palpable in the popliteal or inguinal area. Psychiatric No evidence of depression, anxiety, or agitation. Calm, cooperative, and communicative. Appropriate interactions and affect.. General Notes: Wound exam; the patient has 2 small punched out areas on the left calf. He has better looking wound surfaces but not much else is  changed. Using silver collagen. The patient's wife is doing lymphedema wraps but at much less compression0 Integumentary (Hair, Skin) Wound #1 status is Open. Original cause of wound was Gradually Appeared. The wound is located on the Left,Lateral Lower Leg. The wound measures 1.4cm length x 1.4cm width x 0.3cm depth; 1.539cm^2 area and 0.462cm^3 volume. There is Fat Layer (Subcutaneous Tissue) Exposed exposed. There is no tunneling or undermining noted. There is a  medium amount of serous drainage noted. The wound margin is flat and intact. There is small (1-33%) pink granulation within the wound bed. There is a large (67-100%) amount of necrotic tissue within the wound bed including Adherent Slough. The periwound skin appearance exhibited: Hemosiderin Staining. The periwound skin appearance did not exhibit: Callus, Crepitus, Excoriation, Induration, Rash, Scarring, Dry/Scaly, Maceration, Atrophie Blanche, Cyanosis, Ecchymosis, Mottled, Pallor, Rubor, Erythema. Periwound temperature was noted as No Abnormality. The periwound has tenderness on palpation. Assessment Active Problems ICD-10 Non-pressure chronic ulcer of left calf with other specified severity Lymphedema, not elsewhere classified Atherosclerosis of native arteries of extremities with intermittent claudication, left leg Plan Wound Cleansing: Wound #1 Left,Lateral Lower Leg: Clean wound with Normal Saline. Steven Meza, Steven Meza ([id number]) Anesthetic (add to Medication List): Wound #1 Left,Lateral Lower Leg: Topical Lidocaine 4% cream applied to wound bed prior to debridement (In Clinic Only). Primary Wound Dressing: Wound #1 Left,Lateral Lower Leg: Silver Collagen Secondary Dressing: Wound #1 Left,Lateral Lower Leg: ABD and Kerlix/Conform Dressing Change Frequency: Wound #1 Left,Lateral Lower Leg: Change Dressing Monday, Wednesday, Friday Follow-up Appointments: Wound #1 Left,Lateral Lower Leg: Return Appointment in 1  week. Edema Control: Wound #1 Left,Lateral Lower Leg: Other: - Wife to reapply lymphedema wraps bilaterally 1. I am going to continue with the silver collagen with the same compression that his wife is applying he seems to be tolerating this 2. Waiting for Dr. Naida Sleight analysis of the underlying arterial situation Electronic Signature(s) Signed: 06/01/2018 9:49:40 AM By: Linton Ham MD Entered By: Linton Ham on 05/31/2018 13:15:13 Steven Meza ([id number]) -------------------------------------------------------------------------------- [place] Details Patient Name: Steven Meza Date of Service: 05/31/2018 Medical Record Number: [id number] Patient Account Number: [id number] Date of Birth/Sex: 1930/06/20 (85 y.o. Male) Treating RN: Cornell Barman Primary Care Provider: Fulton Reek Other Clinician: Referring Provider: Fulton Reek Treating Provider/Extender: Tito Dine in Treatment: 2 Diagnosis Coding ICD-10 Codes Meza Description [phone] Non-pressure chronic ulcer of left calf with other specified severity I89.0 Lymphedema, not elsewhere classified I70.212 Atherosclerosis of native arteries of extremities with intermittent claudication, left leg Facility Procedures CPT4 Meza: [id number] Description: [phone] - WOUND CARE VISIT-LEV 2 EST PT Modifier: Quantity: 1 Physician Procedures CPT4 Meza: [id number] Description: [id number] - WC PHYS LEVEL 3 - EST PT ICD-10 Diagnosis Description 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

## 2018-06-07 ENCOUNTER — Encounter: Payer: Medicare Other | Attending: Internal Medicine | Admitting: Internal Medicine

## 2018-06-07 DIAGNOSIS — M1990 Unspecified osteoarthritis, unspecified site: Secondary | ICD-10-CM | POA: Diagnosis not present

## 2018-06-07 DIAGNOSIS — I70212 Atherosclerosis of native arteries of extremities with intermittent claudication, left leg: Secondary | ICD-10-CM | POA: Diagnosis not present

## 2018-06-07 DIAGNOSIS — I872 Venous insufficiency (chronic) (peripheral): Secondary | ICD-10-CM | POA: Diagnosis not present

## 2018-06-07 DIAGNOSIS — I890 Lymphedema, not elsewhere classified: Secondary | ICD-10-CM | POA: Diagnosis not present

## 2018-06-07 DIAGNOSIS — Z6835 Body mass index (BMI) 35.0-35.9, adult: Secondary | ICD-10-CM | POA: Diagnosis not present

## 2018-06-07 DIAGNOSIS — Z8551 Personal history of malignant neoplasm of bladder: Secondary | ICD-10-CM | POA: Insufficient documentation

## 2018-06-07 DIAGNOSIS — I878 Other specified disorders of veins: Secondary | ICD-10-CM | POA: Diagnosis not present

## 2018-06-07 DIAGNOSIS — I10 Essential (primary) hypertension: Secondary | ICD-10-CM | POA: Diagnosis not present

## 2018-06-08 NOTE — Progress Notes (Signed)
Steven Meza ([id number]) Visit Report for 06/07/2018 HPI Details Patient Name: Steven Meza, Steven Meza Date of Service: 06/07/2018 2:15 PM Medical Record Number: [id number] Patient Account Number: [id number] Date of Birth/Sex: 1930-07-13 (85 y.o. M) Treating RN: Cornell Barman Primary Care Provider: Fulton Reek Other Clinician: Referring Provider: Fulton Reek Treating Provider/Extender: Tito Dine in Treatment: 3 History of Present Illness HPI Description: ADMISSION 05/17/2018 Steven Meza is an 85 year old man who is been treated at the lymphedema clinic for a long period with lymphedema wraps for bilateral lower extremity lymphedema. About 6 months ago they noted small open areas on his left lateral calf just above the ankle. These were open. They are apparently putting some form of ointment on this. They have been referred here for our review of this. The patient has a long history of lymphedema and venous stasis. It says in his records that he has recurrent cellulitis although his wife denies this but she states that the lymphedema may have started with cellulitis several years ago. Also in his record there is a history of bladder cancer which they seem to know very little about however he did not have any radiation to the pelvis or anything that could have contributed to lymphedema that they are aware of. There is no prior wound history. The patient has 2 small punched out areas on the left lateral calf that have adherent debris on the surface. Past medical history; lymphedema, hypertension, cellulitis, hearing loss, morbid obesity, osteoarthritis, venous stasis, bladder CA, diverticulosis. ABIs in our clinic were 0.36 on the right and 0.57 on the left 05/24/2018; corrected age on this patient is 75 versus what I said last week. He has been for his arterial studies which predictably are not very good. On the right his ABI is 0.65. Monophasic waveforms noted at the right ankle  including the posterior tibial and dorsalis pedis. He has triphasic waveforms of the common femoral and profunda femoris triphasic proximal SFA with monophasic distal FSA and popliteal artery. Monophasic tibial waveforms. On the left his ABI is 0.58 monophasic waveforms at the left ankle. Duplex of the left lower extremity demonstrated atherosclerotic change with monophasic waveforms throughout the left lower extremity. There was some concern about left iliac disease and potentially femoral-popliteal and tibial disease. The patient does not describe claudication although according to his wife he over emphasizes his activity. Patient states he is limited by pain in both knees His wounds are on the left lateral calf. 2 small areas with necrotic debris. We have been using silver collagen and light Ace wraps 05/31/2018; wounds on the left lateral calf in the setting of very significant lymphedema and probably PAD. We have been using silver collagen. The base of the small wound looks reasonably improved. I sent him to see Dr. Fletcher Anon however I see now he has an appointment with Dr. Alvester Chou on February 12 2/5; left lateral calf wound looks the same. His wife is doing a good job with her lymphedema wraps and maintaining that the edema. He most likely has PAD and is due to see Dr. Gwenlyn Found of infectious disease on February 12 Electronic Signature(s) Signed: 06/07/2018 5:51:47 PM By: Linton Ham MD Entered By: Linton Ham on 06/07/2018 16:25:23 Steven Meza ([id number]) -------------------------------------------------------------------------------- Physical Exam Details Patient Name: Steven Meza Date of Service: 06/07/2018 2:15 PM Medical Record Number: [id number] Patient Account Number: [id number] Date of Birth/Sex: [birth year] (85 y.o. M) Treating RN: Cornell Barman Primary Care Provider: Fulton Reek Other Clinician: Referring Provider: Doy Hutching,  Dellis Filbert Treating Provider/Extender: Ricard Dillon Weeks in Treatment: 3 Constitutional Sitting or standing Blood Pressure is within target range for patient.. Pulse regular and within target range for patient.Marland Kitchen Respirations regular, non-labored and within target range.. Temperature is normal and within the target range for the patient.Marland Kitchen appears in no distress. Eyes Conjunctivae clear. No discharge. Respiratory Respiratory effort is easy and symmetric bilaterally. Rate is normal at rest and on room air.. Cardiovascular Cannot feel popliteal pulses. Pedal pulses absent bilaterally.. Integumentary (Hair, Skin) Bilateral lymphedema but no evidence of cellulitis. Psychiatric No evidence of depression, anxiety, or agitation. Calm, cooperative, and communicative. Appropriate interactions and affect.. Notes Wound exam; the patient has 2 wounds on the left calf. Wound surfaces are about the same we have been using silver collagen and the wife is doing lymphedema wraps. His edema is adequately controlled. Electronic Signature(s) Signed: 06/07/2018 5:51:47 PM By: Linton Ham MD Entered By: Linton Ham on 06/07/2018 16:33:53 Steven Meza ([id number]) -------------------------------------------------------------------------------- Physician Orders Details Patient Name: Steven Meza Date of Service: 06/07/2018 2:15 PM Medical Record Number: [id number] Patient Account Number: [id number] Date of Birth/Sex: Jun 01, 1930 (85 y.o. M) Treating RN: Cornell Barman Primary Care Provider: Fulton Reek Other Clinician: Referring Provider: Fulton Reek Treating Provider/Extender: Tito Dine in Treatment: 3 Verbal / Phone Orders: No Diagnosis Coding Wound Cleansing Wound #1 Left,Lateral Lower Leg o Clean wound with Normal Saline. Anesthetic (add to Medication List) Wound #1 Left,Lateral Lower Leg o Topical Lidocaine 4% cream applied to wound bed prior to debridement (In Clinic Only). Primary Wound Dressing Wound  #1 Left,Lateral Lower Leg o Silver Collagen Secondary Dressing Wound #1 Left,Lateral Lower Leg o ABD and Kerlix/Conform Dressing Change Frequency Wound #1 Left,Lateral Lower Leg o Change Dressing Monday, Wednesday, Friday Follow-up Appointments Wound #1 Left,Lateral Lower Leg o Return Appointment in 1 week. Edema Control Wound #1 Left,Lateral Lower Leg o Other: - Wife to reapply lymphedema wraps bilaterally Electronic Signature(s) Signed: 06/07/2018 5:28:11 PM By: Gretta Cool, BSN, RN, CWS, Kim RN, BSN Signed: 06/07/2018 5:51:47 PM By: Linton Ham MD Entered By: Gretta Cool, BSN, RN, CWS, Kim on 06/07/2018 15:29:51 Steven Meza ([id number]) -------------------------------------------------------------------------------- Problem List Details Patient Name: Steven Meza Date of Service: 06/07/2018 2:15 PM Medical Record Number: [id number] Patient Account Number: [id number] Date of Birth/Sex: 1930/10/29 (85 y.o. 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 with other specified 05/17/2018 No Yes severity I89.0 Lymphedema, not elsewhere classified 05/17/2018 No Yes I70.212 Atherosclerosis of native arteries of extremities with 05/17/2018 No Yes intermittent claudication, left leg Inactive Problems Resolved Problems Electronic Signature(s) Signed: 06/07/2018 5:51:47 PM By: Linton Ham MD Entered By: Linton Ham on 06/07/2018 16:23:19 Steven Meza ([id number]) -------------------------------------------------------------------------------- Progress Note Details Patient Name: Steven Meza Date of Service: 06/07/2018 2:15 PM Medical Record Number: [id number] Patient Account Number:  [id number] Date of Birth/Sex: 11-27-1930 (85 y.o. M) Treating RN: Cornell Barman Primary Care Provider: Fulton Reek Other Clinician: Referring Provider: Fulton Reek Treating Provider/Extender: Tito Dine in Treatment: 3 Subjective History of Present Illness (HPI) ADMISSION 05/17/2018 Mr. Buttram is an 85 year old man who is been treated at the lymphedema clinic for a long period with lymphedema wraps for bilateral lower extremity lymphedema. About 6 months ago they noted small open areas on his left lateral calf just above  the ankle. These were open. They are apparently putting some form of ointment on this. They have been referred here for our review of this. The patient has a long history of lymphedema and venous stasis. It says in his records that he has recurrent cellulitis although his wife denies this but she states that the lymphedema may have started with cellulitis several years ago. Also in his record there is a history of bladder cancer which they seem to know very little about however he did not have any radiation to the pelvis or anything that could have contributed to lymphedema that they are aware of. There is no prior wound history. The patient has 2 small punched out areas on the left lateral calf that have adherent debris on the surface. Past medical history; lymphedema, hypertension, cellulitis, hearing loss, morbid obesity, osteoarthritis, venous stasis, bladder CA, diverticulosis. ABIs in our clinic were 0.36 on the right and 0.57 on the left 05/24/2018; corrected age on this patient is 73 versus what I said last week. He has been for his arterial studies which predictably are not very good. On the right his ABI is 0.65. Monophasic waveforms noted at the right ankle including the posterior tibial and dorsalis pedis. He has triphasic waveforms of the common femoral and profunda femoris triphasic proximal SFA with monophasic distal FSA and popliteal artery.  Monophasic tibial waveforms. On the left his ABI is 0.58 monophasic waveforms at the left ankle. Duplex of the left lower extremity demonstrated atherosclerotic change with monophasic waveforms throughout the left lower extremity. There was some concern about left iliac disease and potentially femoral-popliteal and tibial disease. The patient does not describe claudication although according to his wife he over emphasizes his activity. Patient states he is limited by pain in both knees His wounds are on the left lateral calf. 2 small areas with necrotic debris. We have been using silver collagen and light Ace wraps 05/31/2018; wounds on the left lateral calf in the setting of very significant lymphedema and probably PAD. We have been using silver collagen. The base of the small wound looks reasonably improved. I sent him to see Dr. Fletcher Anon however I see now he has an appointment with Dr. Alvester Chou on February 12 2/5; left lateral calf wound looks the same. His wife is doing a good job with her lymphedema wraps and maintaining that the edema. He most likely has PAD and is due to see Dr. Gwenlyn Found of infectious disease on February 12 Objective Constitutional Steven Meza, Steven Meza ([id number]) Sitting or standing Blood Pressure is within target range for patient.. Pulse regular and within target range for patient.Marland Kitchen Respirations regular, non-labored and within target range.. Temperature is normal and within the target range for the patient.Marland Kitchen appears in no distress. Vitals Time Taken: 2:38 PM, Height: 71 in, Weight: 252 lbs, BMI: 35.1, Temperature: 98.0 F, Pulse: 79 bpm, Respiratory Rate: 16 breaths/min, Blood Pressure: 138/54 mmHg. Eyes Conjunctivae clear. No discharge. Respiratory Respiratory effort is easy and symmetric bilaterally. Rate is normal at rest and on room air.. Cardiovascular Cannot feel popliteal pulses. Pedal pulses absent bilaterally.Marland Kitchen Psychiatric No evidence of depression, anxiety, or  agitation. Calm, cooperative, and communicative. Appropriate interactions and affect.. General Notes: Wound exam; the patient has 2 wounds on the left calf. Wound surfaces are about the same we have been using silver collagen and the wife is doing lymphedema wraps. His edema is adequately controlled. Integumentary (Hair, Skin) Bilateral lymphedema but no evidence of cellulitis. Wound #1 status is Open. Original  cause of wound was Gradually Appeared. The wound is located on the Left,Lateral Lower Leg. The wound measures 1.4cm length x 1.4cm width x 0.3cm depth; 1.539cm^2 area and 0.462cm^3 volume. There is Fat Layer (Subcutaneous Tissue) Exposed exposed. There is no tunneling or undermining noted. There is a medium amount of serous drainage noted. The wound margin is flat and intact. There is small (1-33%) pink granulation within the wound bed. There is a large (67-100%) amount of necrotic tissue within the wound bed including Adherent Slough. The periwound skin appearance exhibited: Hemosiderin Staining. The periwound skin appearance did not exhibit: Callus, Crepitus, Excoriation, Induration, Rash, Scarring, Dry/Scaly, Maceration, Atrophie Blanche, Cyanosis, Ecchymosis, Mottled, Pallor, Rubor, Erythema. Periwound temperature was noted as No Abnormality. The periwound has tenderness on palpation. Assessment Active Problems ICD-10 Non-pressure chronic ulcer of left calf with other specified severity Lymphedema, not elsewhere classified Atherosclerosis of native arteries of extremities with intermittent claudication, left leg Plan Wound Cleansing: Wound #1 Left,Lateral Lower Leg: Clean wound with Normal Saline. Steven Meza, Steven Meza ([id number]) Anesthetic (add to Medication List): Wound #1 Left,Lateral Lower Leg: Topical Lidocaine 4% cream applied to wound bed prior to debridement (In Clinic Only). Primary Wound Dressing: Wound #1 Left,Lateral Lower Leg: Silver Collagen Secondary  Dressing: Wound #1 Left,Lateral Lower Leg: ABD and Kerlix/Conform Dressing Change Frequency: Wound #1 Left,Lateral Lower Leg: Change Dressing Monday, Wednesday, Friday Follow-up Appointments: Wound #1 Left,Lateral Lower Leg: Return Appointment in 1 week. Edema Control: Wound #1 Left,Lateral Lower Leg: Other: - Wife to reapply lymphedema wraps bilaterally 1. No change in current orders 2. He is seeing Dr. Gwenlyn Found on February 12 I will try to send him a secure text message Electronic Signature(s) Signed: 06/07/2018 5:51:47 PM By: Linton Ham MD Entered By: Linton Ham on 06/07/2018 16:37:07 Steven Meza ([id number]) -------------------------------------------------------------------------------- SuperBill Details Patient Name: Steven Meza Date of Service: 06/07/2018 Medical Record Number: [id number] Patient Account Number: [id number] Date of Birth/Sex: 03/21/1931 (85 y.o. M) Treating RN: Cornell Barman Primary Care Provider: Fulton Reek Other Clinician: Referring Provider: Fulton Reek Treating Provider/Extender: Tito Dine in Treatment: 3 Diagnosis Coding ICD-10 Codes Meza Description [id number] Non-pressure chronic ulcer of left calf with other specified severity I89.0 Lymphedema, not elsewhere classified I70.212 Atherosclerosis of native arteries of extremities with intermittent claudication, left leg Facility Procedures CPT4 Meza: [id number] Description: [id number] - WOUND CARE VISIT-LEV 2 EST PT Modifier: Quantity: 1 Physician Procedures CPT4 Meza Description: [id number] - WC PHYS LEVEL 3 - EST PT ICD-10 Diagnosis Description 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

## 2018-06-09 NOTE — Progress Notes (Signed)
COSIMO, SCHERTZER ([id number]) Visit Report for 06/07/2018 Arrival Information Details Patient Name: Steven Meza, Steven Meza Date of Service: 06/07/2018 2:15 PM Medical Record Number: [id number] Patient Account Number: [id number] Date of Birth/Sex: [birth year] (85 y.o. M) Treating RN: Cornell Barman Primary Care Thornton Dohrmann: Fulton Reek Other Clinician: Referring Marylan Glore: Fulton Reek Treating Sharron Petruska/Extender: Tito Dine in Treatment: 3 Visit Information History Since Last Visit Added or deleted any medications: No Patient Arrived: Cane Any new allergies or adverse reactions: No Arrival Time: 14:36 Had a fall or experienced change in No Accompanied By: wife activities of daily living that may affect Transfer Assistance: None risk of falls: Patient Identification Verified: Yes Signs or symptoms of abuse/neglect since last visito No Secondary Verification Process Completed: Yes Hospitalized since last visit: No Implantable device outside of the clinic excluding No cellular tissue based products placed in the center since last visit: Has Dressing in Place as Prescribed: Yes Pain Present Now: No Electronic Signature(s) Signed: 06/07/2018 4:36:22 PM By: Lorine Bears RCP, RRT, CHT Entered By: Lorine Bears on 06/07/2018 14:38:13 [name] ([id number]) -------------------------------------------------------------------------------- Clinic Level of Care Assessment Details Patient Name: [name] Date of Service: 06/07/2018 2:15 PM Medical Record Number: [id number] Patient Account Number: [id number] Date of Birth/Sex: [birth year] (85 y.o. M) Treating RN: Cornell Barman Primary Care Brandan Robicheaux: Fulton Reek Other Clinician: Referring Karinna Beadles: Fulton Reek Treating Adom Schoeneck/Extender: Tito Dine in Treatment: 3 Clinic Level of Care Assessment Items TOOL 4 Quantity Score []  - Use when only an EandM is performed on FOLLOW-UP visit  0 ASSESSMENTS - Nursing Assessment / Reassessment []  - Reassessment of Co-morbidities (includes updates in patient status) 0 X- 1 5 Reassessment of Adherence to Treatment Plan ASSESSMENTS - Wound and Skin Assessment / Reassessment X - Simple Wound Assessment / Reassessment - one wound 1 5 []  - 0 Complex Wound Assessment / Reassessment - multiple wounds []  - 0 Dermatologic / Skin Assessment (not related to wound area) ASSESSMENTS - Focused Assessment []  - Circumferential Edema Measurements - multi extremities 0 []  - 0 Nutritional Assessment / Counseling / Intervention []  - 0 Lower Extremity Assessment (monofilament, tuning fork, pulses) []  - 0 Peripheral Arterial Disease Assessment (using hand held doppler) ASSESSMENTS - Ostomy and/or Continence Assessment and Care []  - Incontinence Assessment and Management 0 []  - 0 Ostomy Care Assessment and Management (repouching, etc.) PROCESS - Coordination of Care X - Simple Patient / Family Education for ongoing care 1 15 []  - 0 Complex (extensive) Patient / Family Education for ongoing care []  - 0 Staff obtains Programmer, systems, Records, Test Results / Process Orders []  - 0 Staff telephones HHA, Nursing Homes / Clarify orders / etc []  - 0 Routine Transfer to another Facility (non-emergent condition) []  - 0 Routine Hospital Admission (non-emergent condition) []  - 0 New Admissions / Biomedical engineer / Ordering NPWT, Apligraf, etc. []  - 0 Emergency Hospital Admission (emergent condition) X- 1 10 Simple Discharge Coordination OREL, COOLER ([id number]) []  - 0 Complex (extensive) Discharge Coordination PROCESS - Special Needs []  - Pediatric / Minor Patient Management 0 []  - 0 Isolation Patient Management []  - 0 Hearing / Language / Visual special needs []  - 0 Assessment of Community assistance (transportation, D/C planning, etc.) []  - 0 Additional assistance / Altered mentation []  - 0 Support Surface(s) Assessment (bed,  cushion, seat, etc.) INTERVENTIONS - Wound Cleansing / Measurement X - Simple Wound Cleansing - one wound 1 5 []  - 0 Complex Wound Cleansing - multiple wounds X-  1 5 Wound Imaging (photographs - any number of wounds) []  - 0 Wound Tracing (instead of photographs) X- 1 5 Simple Wound Measurement - one wound []  - 0 Complex Wound Measurement - multiple wounds INTERVENTIONS - Wound Dressings []  - Small Wound Dressing one or multiple wounds 0 X- 1 15 Medium Wound Dressing one or multiple wounds []  - 0 Large Wound Dressing one or multiple wounds []  - 0 Application of Medications - topical []  - 0 Application of Medications - injection INTERVENTIONS - Miscellaneous []  - External ear exam 0 []  - 0 Specimen Collection (cultures, biopsies, blood, body fluids, etc.) []  - 0 Specimen(s) / Culture(s) sent or taken to Lab for analysis []  - 0 Patient Transfer (multiple staff / Civil Service fast streamer / Similar devices) []  - 0 Simple Staple / Suture removal (25 or less) []  - 0 Complex Staple / Suture removal (26 or more) []  - 0 Hypo / Hyperglycemic Management (close monitor of Blood Glucose) []  - 0 Ankle / Brachial Index (ABI) - do not check if billed separately X- 1 5 Vital Signs JATAVIAN, CALICA ([id number]) Has the patient been seen at the hospital within the last three years: Yes Total Score: 70 Level Of Care: New/Established - Level 2 Electronic Signature(s) Signed: 06/07/2018 5:28:11 PM By: Gretta Cool, BSN, RN, CWS, Kim RN, BSN Entered By: Gretta Cool, BSN, RN, CWS, Kim on 06/07/2018 15:30:39 [name] ([id number]) -------------------------------------------------------------------------------- Encounter Discharge Information Details Patient Name: [name] Date of Service: 06/07/2018 2:15 PM Medical Record Number: [id number] Patient Account Number: [id number] Date of Birth/Sex: Sep 22, 1930 (85 y.o. M) Treating RN: Harold Barban Primary Care Demetress Tift: Fulton Reek Other  Clinician: Referring Ruxin Ransome: Fulton Reek Treating Ericberto Padget/Extender: Tito Dine in Treatment: 3 Encounter Discharge Information Items Discharge Condition: Stable Ambulatory Status: Cane Discharge Destination: Home Transportation: Private Auto Accompanied By: wife Schedule Follow-up Appointment: Yes Clinical Summary of Care: Electronic Signature(s) Signed: 06/08/2018 11:43:02 AM By: Harold Barban Entered By: Harold Barban on 06/07/2018 15:50:26 [name] ([id number]) -------------------------------------------------------------------------------- Lower Extremity Assessment Details Patient Name: [name] Date of Service: 06/07/2018 2:15 PM Medical Record Number: [id number] Patient Account Number: [id number] Date of Birth/Sex: [birth year] (85 y.o. M) Treating RN: Harold Barban Primary Care Seraphim Trow: Fulton Reek Other Clinician: Referring Shahana Capes: Fulton Reek Treating Jermesha Sottile/Extender: Tito Dine in Treatment: 3 Edema Assessment Assessed: [Left: No] [Right: No] Edema: [Left: Yes] [Right: Yes] Calf Left: Right: Point of Measurement: 32 cm From Medial Instep 47 cm 44 cm Ankle Left: Right: Point of Measurement: 10 cm From Medial Instep 29.5 cm 29 cm Vascular Assessment Pulses: Dorsalis Pedis Palpable: [Left:Yes] [Right:Yes] Posterior Tibial Palpable: [Left:Yes] [Right:Yes] Extremity colors, hair growth, and conditions: Extremity Color: [Left:Hyperpigmented] [Right:Hyperpigmented] Hair Growth on Extremity: [Left:No] [Right:No] Temperature of Extremity: [Left:Cool] [Right:Cool] Capillary Refill: [Left:< 3 seconds] [Right:< 3 seconds] Toe Nail Assessment Left: Right: Thick: Yes Yes Discolored: Yes Yes Deformed: Yes Yes Improper Length and Hygiene: No No Electronic Signature(s) Signed: 06/08/2018 11:43:02 AM By: Harold Barban Entered By: Harold Barban on 06/07/2018 15:06:21 [name]  ([id number]) -------------------------------------------------------------------------------- Multi Wound Chart Details Patient Name: [name] Date of Service: 06/07/2018 2:15 PM Medical Record Number: [id number] Patient Account Number: [id number] Date of Birth/Sex: June 02, 1930 (85 y.o. M) Treating RN: Cornell Barman Primary Care Michael Walrath: Fulton Reek Other Clinician: Referring Luiza Carranco: Fulton Reek Treating Aashi Derrington/Extender: Tito Dine in Treatment: 3 Vital Signs Height(in): 71 Pulse(bpm): 79 Weight(lbs): 252 Blood Pressure(mmHg): 138/54 Body Mass Index(BMI): 35 Temperature(F): 98.0 Respiratory Rate 16 (  breaths/min): Photos: [N/A:N/A] Wound Location: Left Lower Leg - Lateral N/A N/A Wounding Event: Gradually Appeared N/A N/A Primary Etiology: Lymphedema N/A N/A Comorbid History: Lymphedema, Coronary Artery N/A N/A Disease, Hypertension Date Acquired: 11/14/2017 N/A N/A Weeks of Treatment: 3 N/A N/A Wound Status: Open N/A N/A Clustered Wound: Yes N/A N/A Clustered Quantity: 2 N/A N/A Measurements L x W x D 1.4x1.4x0.3 N/A N/A (cm) Area (cm) : 1.539 N/A N/A Volume (cm) : 0.462 N/A N/A % Reduction in Area: 51.00% N/A N/A % Reduction in Volume: -47.10% N/A N/A Classification: Full Thickness Without N/A N/A Exposed Support Structures Exudate Amount: Medium N/A N/A Exudate Type: Serous N/A N/A Exudate Color: amber N/A N/A Wound Margin: Flat and Intact N/A N/A Granulation Amount: Small (1-33%) N/A N/A Granulation Quality: Pink N/A N/A Necrotic Amount: Large (67-100%) N/A N/A Exposed Structures: Fat Layer (Subcutaneous N/A N/A Tissue) Exposed: Yes Fascia: No Tendon: No COTTON, BECKLEY ([id number]) Muscle: No Joint: No Bone: No Epithelialization: None N/A N/A Periwound Skin Texture: Excoriation: No N/A N/A Induration: No Callus: No Crepitus: No Rash: No Scarring: No Periwound Skin Moisture: Maceration: No N/A N/A Dry/Scaly:  No Periwound Skin Color: Hemosiderin Staining: Yes N/A N/A Atrophie Blanche: No Cyanosis: No Ecchymosis: No Erythema: No Mottled: No Pallor: No Rubor: No Temperature: No Abnormality N/A N/A Tenderness on Palpation: Yes N/A N/A Wound Preparation: Ulcer Cleansing: N/A N/A Rinsed/Irrigated with Saline Topical Anesthetic Applied: Other: lidocaine 4% Treatment Notes Electronic Signature(s) Signed: 06/07/2018 5:51:47 PM By: Linton Ham MD Entered By: Linton Ham on 06/07/2018 16:23:35 [name] ([id number]) -------------------------------------------------------------------------------- Multi-Disciplinary Care Plan Details Patient Name: [name] Date of Service: 06/07/2018 2:15 PM Medical Record Number: [id number] Patient Account Number: [id number] Date of Birth/Sex: [birth year] (85 y.o. M) Treating RN: Cornell Barman Primary Care Cola Gane: Fulton Reek Other Clinician: Referring Daril Warga: Fulton Reek Treating Ziaire Hagos/Extender: Tito Dine in Treatment: 3 Active Inactive Orientation to the Wound Care Program Nursing Diagnoses: Knowledge deficit related to the wound healing center program Goals: Patient/caregiver will verbalize understanding of the [place] Program Date Initiated: 05/17/2018 Target Resolution Date: 06/17/2018 Goal Status: Active Interventions: Provide education on orientation to the wound center Notes: Venous Leg Ulcer Nursing Diagnoses: Potential for venous Insuffiency (use before diagnosis confirmed) Goals: Patient/caregiver will verbalize understanding of disease process and disease management Date Initiated: 05/17/2018 Target Resolution Date: 06/17/2018 Goal Status: Active Interventions: Assess peripheral edema status every visit. Treatment Activities: Non-invasive vascular studies : 05/17/2018 Notes: Wound/Skin Impairment Nursing Diagnoses: Impaired tissue integrity Goals: Patient/caregiver will  verbalize understanding of skin care regimen Date Initiated: 05/17/2018 Target Resolution Date: 05/17/2018 Goal Status: Active WILKIE, ZENON ([id number]) Ulcer/skin breakdown will heal within 14 weeks Date Initiated: 05/17/2018 Target Resolution Date: 08/16/2018 Goal Status: Active Interventions: Assess ulceration(s) every visit Treatment Activities: Referred to DME Brooklynn Brandenburg for dressing supplies : 05/17/2018 Topical wound management initiated : 05/17/2018 Notes: Electronic Signature(s) Signed: 06/07/2018 5:28:11 PM By: Gretta Cool, BSN, RN, CWS, Kim RN, BSN Entered By: Gretta Cool, BSN, RN, CWS, Kim on 06/07/2018 15:27:54 [name] ([id number]) -------------------------------------------------------------------------------- Pain Assessment Details Patient Name: [name] Date of Service: 06/07/2018 2:15 PM Medical Record Number: [id number] Patient Account Number: [id number] Date of Birth/Sex: 06-08-1930 (85 y.o. M) Treating RN: Cornell Barman Primary Care Kelwin Gibler: Fulton Reek Other Clinician: Referring Zuleyka Kloc: Fulton Reek Treating Bridney Guadarrama/Extender: Tito Dine in Treatment: 3 Active Problems Location of Pain Severity and Description of Pain Patient Has Paino No Site Locations Pain Management and Medication Current Pain Management: Electronic  Signature(s) Signed: 06/07/2018 4:36:22 PM By: Paulla Fore, RRT, CHT Signed: 06/07/2018 5:28:11 PM By: Gretta Cool, BSN, RN, CWS, Kim RN, BSN Entered By: Lorine Bears on 06/07/2018 14:38:20 [name] ([id number]) -------------------------------------------------------------------------------- Patient/Caregiver Education Details Patient Name: [name] Date of Service: 06/07/2018 2:15 PM Medical Record Number: [id number] Patient Account Number: [id number] Date of Birth/Gender: 07-12-1930 (85 y.o. M) Treating RN: Cornell Barman Primary Care Physician: Fulton Reek Other  Clinician: Referring Physician: Fulton Reek Treating Physician/Extender: Tito Dine in Treatment: 3 Education Assessment Education Provided To: Patient Education Topics Provided Venous: Handouts: Controlling Swelling with Multilayered Compression Wraps, Other: do not wrap to tight Methods: Demonstration, Explain/Verbal Responses: State content correctly Electronic Signature(s) Signed: 06/08/2018 11:43:02 AM By: Harold Barban Entered By: Harold Barban on 06/07/2018 15:50:32 [name] ([id number]) -------------------------------------------------------------------------------- Wound Assessment Details Patient Name: [name] Date of Service: 06/07/2018 2:15 PM Medical Record Number: [id number] Patient Account Number: [id number] Date of Birth/Sex: 1931-01-22 (85 y.o. M) Treating RN: Harold Barban Primary Care Cristol Engdahl: Fulton Reek Other Clinician: Referring Erasto Sleight: Fulton Reek Treating Elmire Amrein/Extender: Tito Dine in Treatment: 3 Wound Status Wound Number: 1 Primary Lymphedema Etiology: Wound Location: Left Lower Leg - Lateral Wound Status: Open Wounding Event: Gradually Appeared Comorbid Lymphedema, Coronary Artery Disease, Date Acquired: 11/14/2017 History: Hypertension Weeks Of Treatment: 3 Clustered Wound: Yes Photos Photo Uploaded By: Harold Barban on 06/07/2018 15:16:30 Wound Measurements Length: (cm) 1.4 % R Width: (cm) 1.4 % R Depth: (cm) 0.3 Epi Clustered Quantity: 2 Tun Area: (cm) 1.539 Un Volume: (cm) 0.462 eduction in Area: 51% eduction in Volume: -47.1% thelialization: None neling: No dermining: No Wound Description Full Thickness Without Exposed Support Classification: Structures Wound Margin: Flat and Intact Exudate Medium Amount: Exudate Type: Serous Exudate Color: amber Foul Odor After Cleansing: No Slough/Fibrino Yes Wound Bed Granulation Amount: Small (1-33%) Exposed  Structure Granulation Quality: Pink Fascia Exposed: No Necrotic Amount: Large (67-100%) Fat Layer (Subcutaneous Tissue) Exposed: Yes Necrotic Quality: Adherent Slough Tendon Exposed: No Muscle Exposed: No Joint Exposed: No THERESA, DOHRMAN ([id number]) Bone Exposed: No Periwound Skin Texture Texture Color No Abnormalities Noted: No No Abnormalities Noted: No Callus: No Atrophie Blanche: No Crepitus: No Cyanosis: No Excoriation: No Ecchymosis: No Induration: No Erythema: No Rash: No Hemosiderin Staining: Yes Scarring: No Mottled: No Pallor: No Moisture Rubor: No No Abnormalities Noted: No Dry / Scaly: No Temperature / Pain Maceration: No Temperature: No Abnormality Tenderness on Palpation: Yes Wound Preparation Ulcer Cleansing: Rinsed/Irrigated with Saline Topical Anesthetic Applied: Other: lidocaine 4%, Electronic Signature(s) Signed: 06/08/2018 11:43:02 AM By: Harold Barban Entered By: Harold Barban on 06/07/2018 15:03:38 [name] ([id number]) -------------------------------------------------------------------------------- Vitals Details Patient Name: [name] Date of Service: 06/07/2018 2:15 PM Medical Record Number: [id number] Patient Account Number: [id number] Date of Birth/Sex: [birth year] (85 y.o. M) Treating RN: Cornell Barman Primary Care Jabrea Kallstrom: Fulton Reek Other Clinician: Referring Lucresia Simic: Fulton Reek Treating Sonnet Rizor/Extender: Tito Dine in Treatment: 3 Vital Signs Time Taken: 14:38 Temperature (F): 98.0 Height (in): 71 Pulse (bpm): 79 Weight (lbs): 252 Respiratory Rate (breaths/min): 16 Body Mass Index (BMI): 35.1 Blood Pressure (mmHg): 138/54 Reference Range: 80 - 120 mg / dl Airway Electronic Signature(s) Signed: 06/07/2018 4:36:22 PM By: Lorine Bears RCP, RRT, CHT Entered By: Lorine Bears on 06/07/2018 14:41:27

## 2018-06-14 ENCOUNTER — Ambulatory Visit: Payer: Medicare Other | Admitting: Cardiovascular Disease

## 2018-06-20 ENCOUNTER — Ambulatory Visit (INDEPENDENT_AMBULATORY_CARE_PROVIDER_SITE_OTHER): Payer: Medicare Other | Admitting: Cardiovascular Disease

## 2018-06-20 ENCOUNTER — Ambulatory Visit: Payer: Medicare Other | Admitting: Cardiovascular Disease

## 2018-06-20 ENCOUNTER — Encounter: Payer: Self-pay | Admitting: Cardiovascular Disease

## 2018-06-20 VITALS — BP 141/61 | HR 66 | Ht 71.0 in | Wt 263.0 lb

## 2018-06-20 DIAGNOSIS — I10 Essential (primary) hypertension: Secondary | ICD-10-CM | POA: Diagnosis not present

## 2018-06-20 DIAGNOSIS — I878 Other specified disorders of veins: Secondary | ICD-10-CM | POA: Diagnosis not present

## 2018-06-20 NOTE — Assessment & Plan Note (Signed)
History of obstructive sleep apnea on CPAP in the past. 

## 2018-06-20 NOTE — Patient Instructions (Signed)
Medication Instructions:  Your physician recommends that you continue on your current medications as directed. Please refer to the Current Medication list given to you today.  If you need a refill on your cardiac medications before your next appointment, please call your pharmacy.   Lab work: NONE If you have labs (blood work) drawn today and your tests are completely normal, you will receive your results only by: Marland Kitchen MyChart Message (if you have MyChart) OR . A paper copy in the mail If you have any lab test that is abnormal or we need to change your treatment, we will call you to review the results.  Testing/Procedures: NONE  Follow-Up: At [facility], you and your health needs are our priority.  As part of our continuing mission to provide you with exceptional heart care, we have created designated Provider Care Teams.  These Care Teams include your primary Cardiologist (physician) and Advanced Practice Providers (APPs -  Physician Assistants and Nurse Practitioners) who all work together to provide you with the care you need, when you need it. . You will need a follow up appointment in 6 months.  Please call our office 2 months in advance to schedule this appointment.  You may see Dr. Gwenlyn Found or one of the following Advanced Practice Providers on your designated Care Team:   . Kerin Ransom, [place] . Almyra Deforest, PA-C . Fabian Sharp, PA-C . Jory Sims, DNP . Rosaria Ferries, PA-C . Roby Lofts, PA-C . Sande Rives, PA-C

## 2018-06-20 NOTE — Progress Notes (Signed)
06/20/2018 Steven Meza   [birth year]  [id number]  Primary Physician Doy Hutching, Leonie Douglas, MD Primary Cardiologist: Lorretta Harp MD Garret Reddish, [place], [place]  HPI:  Steven Meza. is a 85 y.o. moderately overweight married Caucasian male father of one adopted daughter, grandfather 2 grandchildren who is accompanied by his wife Steven Meza today.  He was initially sent to me by Dr. Dellia Nims from the wound care center.  He does have a history of treated hypertension and remote tobacco abuse.  He apparently had a remote stroke as well but never had a heart attack.  He denies chest pain.  He is fairly sedentary and really does not ambulate much.  I saw him 6 weeks ago for a venous stasis ulcer on the lateral lateral aspect of his left calf.  He is clearly has lymphedema with venous stasis changes.  Recent arterial Dopplers performed at [facility] 05/24/2018 revealed a right ABI of 0.265 and a left ABI of 0.58 with iliac and femoral-popliteal occlusive disease.   Current Meds  Medication Sig  . aspirin 81 MG tablet Take 81 mg by mouth daily.  . hydrochlorothiazide (HYDRODIURIL) 25 MG tablet Take 25 mg by mouth daily.  Marland Kitchen ibuprofen (ADVIL,MOTRIN) 200 MG tablet Take by mouth.  Marland Kitchen lisinopril (PRINIVIL,ZESTRIL) 20 MG tablet Take 40 mg by mouth daily.  . mupirocin ointment (BACTROBAN) 2 % Apply to wound after soaking BID  . omeprazole (PRILOSEC) 40 MG capsule Take by mouth.  . triamcinolone cream (KENALOG) 0.5 % APPLY CREAM TOPICALLY TWICE DAILY FOR UP TO 7 TO 10 DAYS     No Known Allergies  Social History   Socioeconomic History  . Marital status: Married    Spouse name: Not on file  . Number of children: Not on file  . Years of education: Not on file  . Highest education level: Not on file  Occupational History  . Not on file  Social Needs  . Financial resource strain: Not on file  . Food insecurity:    Worry: Not on file    Inability: Not on file  . Transportation  needs:    Medical: Not on file    Non-medical: Not on file  Tobacco Use  . Smoking status: Former Smoker    Last attempt to quit: 02/28/1997    Years since quitting: 21.3  . Smokeless tobacco: Never Used  Substance and Sexual Activity  . Alcohol use: Yes    Comment: OCCASIONALLY  . Drug use: No  . Sexual activity: Not on file  Lifestyle  . Physical activity:    Days per week: Not on file    Minutes per session: Not on file  . Stress: Not on file  Relationships  . Social connections:    Talks on phone: Not on file    Gets together: Not on file    Attends religious service: Not on file    Active member of club or organization: Not on file    Attends meetings of clubs or organizations: Not on file    Relationship status: Not on file  . Intimate partner violence:    Fear of current or ex partner: Not on file    Emotionally abused: Not on file    Physically abused: Not on file    Forced sexual activity: Not on file  Other Topics Concern  . Not on file  Social History Narrative  . Not on file     Review  of Systems: General: negative for chills, fever, night sweats or weight changes.  Cardiovascular: negative for chest pain, dyspnea on exertion, edema, orthopnea, palpitations, paroxysmal nocturnal dyspnea or shortness of breath Dermatological: negative for rash Respiratory: negative for cough or wheezing Urologic: negative for hematuria Abdominal: negative for nausea, vomiting, diarrhea, bright red blood per rectum, melena, or hematemesis Neurologic: negative for visual changes, syncope, or dizziness All other systems reviewed and are otherwise negative except as noted above.    Blood pressure (!) 141/61, pulse 66, height 5\' 11"  (1.803 m), weight 263 lb (119.3 kg).  General appearance: alert and no distress Neck: no adenopathy, no carotid bruit, no JVD, supple, symmetrical, trachea midline and thyroid not enlarged, symmetric, no tenderness/mass/nodules Lungs: clear to  auscultation bilaterally Heart: regular rate and rhythm, S1, S2 normal, no murmur, click, rub or gallop Extremities: 3+ edema bilaterally with venous stasis changes Pulses: Diminished pulses bilaterally Skin: Venous stasis ulcer lateral aspect of left calf Neurologic: Alert and oriented X 3, normal strength and tone. Normal symmetric reflexes. Normal coordination and gait  EKG not performed today  ASSESSMENT AND PLAN:   Hypertension History of obstructive sleep apnea on CPAP in the past  Venous stasis Mr. Frye was sent to me by Dr. Dellia Nims for a wound on the lateral aspect of his left calf.  He does have lymphedema with venous stasis changes what appears to be a stasis ulcer.  Recent arterial Dopplers performed at [facility] 05/24/2018 did reveal a right ABI 0.65 and left .72 with iliac and femoral popliteal as well as tibial occlusive disease.  I do not think his ulcer is in the ischemic ulcer related to arterial occlusive disease.      Lorretta Harp MD FACP,FACC,FAHA, [facility] 06/20/2018 3:28 PM

## 2018-06-20 NOTE — Assessment & Plan Note (Signed)
Mr. Holtsclaw was sent to me by Dr. Dellia Nims for a wound on the lateral aspect of his left calf.  He does have lymphedema with venous stasis changes what appears to be a stasis ulcer.  Recent arterial Dopplers performed at [facility] 05/24/2018 did reveal a right ABI 0.65 and left .70 with iliac and femoral popliteal as well as tibial occlusive disease.  I do not think his ulcer is in the ischemic ulcer related to arterial occlusive disease.

## 2018-06-21 ENCOUNTER — Encounter: Payer: Medicare Other | Admitting: Internal Medicine

## 2018-06-21 DIAGNOSIS — I890 Lymphedema, not elsewhere classified: Secondary | ICD-10-CM | POA: Diagnosis not present

## 2018-06-22 NOTE — Progress Notes (Signed)
KHADEN, GATER ([id number]) Visit Report for 06/21/2018 HPI Details Patient Name: Steven, Meza Date of Service: 06/21/2018 1:45 PM Medical Record Number: [id number] Patient Account Number: [id number] Date of Birth/Sex: Sep 08, 1930 (85 y.o. M) Treating RN: Cornell Barman Primary Care Provider: Fulton Reek Other Clinician: Referring Provider: Fulton Reek Treating Provider/Extender: Tito Dine in Treatment: 5 History of Present Illness HPI Description: ADMISSION 05/17/2018 Mr. Feltus is an 85 year old man who is been treated at the lymphedema clinic for a long period with lymphedema wraps for bilateral lower extremity lymphedema. About 6 months ago they noted small open areas on his left lateral calf just above the ankle. These were open. They are apparently putting some form of ointment on this. They have been referred here for our review of this. The patient has a long history of lymphedema and venous stasis. It says in his records that he has recurrent cellulitis although his wife denies this but she states that the lymphedema may have started with cellulitis several years ago. Also in his record there is a history of bladder cancer which they seem to know very little about however he did not have any radiation to the pelvis or anything that could have contributed to lymphedema that they are aware of. There is no prior wound history. The patient has 2 small punched out areas on the left lateral calf that have adherent debris on the surface. Past medical history; lymphedema, hypertension, cellulitis, hearing loss, morbid obesity, osteoarthritis, venous stasis, bladder CA, diverticulosis. ABIs in our clinic were 0.36 on the right and 0.57 on the left 05/24/2018; corrected age on this patient is 85 versus what I said last week. He has been for his arterial studies which predictably are not very good. On the right his ABI is 0.65. Monophasic waveforms noted at the right ankle  including the posterior tibial and dorsalis pedis. He has triphasic waveforms of the common femoral and profunda femoris triphasic proximal SFA with monophasic distal FSA and popliteal artery. Monophasic tibial waveforms. On the left his ABI is 0.58 monophasic waveforms at the left ankle. Duplex of the left lower extremity demonstrated atherosclerotic change with monophasic waveforms throughout the left lower extremity. There was some concern about left iliac disease and potentially femoral-popliteal and tibial disease. The patient does not describe claudication although according to his wife he over emphasizes his activity. Patient states he is limited by pain in both knees His wounds are on the left lateral calf. 2 small areas with necrotic debris. We have been using silver collagen and light Ace wraps 05/31/2018; wounds on the left lateral calf in the setting of very significant lymphedema and probably PAD. We have been using silver collagen. The base of the small wound looks reasonably improved. I sent him to see Dr. Fletcher Anon however I see now he has an appointment with Dr. Alvester Chou on February 12 2/5; left lateral calf wound looks the same. His wife is doing a good job with her lymphedema wraps and maintaining that the edema. He most likely has PAD and is due to see Dr. Gwenlyn Found of infectious disease on February 12 2/19; left lateral calf wounds look about the same. This looks like wound secondary to chronic venous insufficiency with lymphedema however the patient has very poor arterial studies. We referred him to Dr. Gwenlyn Found. Dr. Gwenlyn Found did not feel he needed to do anything from an arterial point of view. He did not address the question about the aggressiveness of compression. After some thoughts about  this I elected to go ahead and put him in 3 layer compression which after all would be less compression then the lymphedema clinic was putting on this. I am hopeful that this should be enough to get  some closure of the small wounds Electronic Signature(s) Steven, Meza ([id number]) Signed: 06/21/2018 6:18:32 PM By: Linton Ham MD Entered By: Linton Ham on 06/21/2018 17:12:54 Steven Meza ([id number]) -------------------------------------------------------------------------------- Physical Exam Details Patient Name: Steven Meza Date of Service: 06/21/2018 1:45 PM Medical Record Number: [id number] Patient Account Number: [id number] Date of Birth/Sex: 1930-11-15 (85 y.o. M) Treating RN: Cornell Barman Primary Care Provider: Fulton Reek Other Clinician: Referring Provider: Fulton Reek Treating Provider/Extender: Tito Dine in Treatment: 5 Constitutional Patient is hypertensive.. Pulse regular and within target range for patient.Marland Kitchen Respirations regular, non-labored and within target range.. Temperature is normal and within the target range for the patient.Marland Kitchen appears in no distress. Cardiovascular . Psychiatric No evidence of depression, anxiety, or agitation. Calm, cooperative, and communicative. Appropriate interactions and affect.. Notes Wound exam; the patient has 2 open wounds on the left lateral calf. I cannot feel pulses in his dorsalis pedis or posterior tibial pulses although I do feel his popliteal pulse. His foot is not particularly cold. Electronic Signature(s) Signed: 06/21/2018 6:18:32 PM By: Linton Ham MD Entered By: Linton Ham on 06/21/2018 17:20:44 Steven Meza ([id number]) -------------------------------------------------------------------------------- Physician Orders Details Patient Name: Steven Meza Date of Service: 06/21/2018 1:45 PM Medical Record Number: [id number] Patient Account Number: [id number] Date of Birth/Sex: Mar 09, 1931 (85 y.o. M) Treating RN: Cornell Barman Primary Care Provider: Fulton Reek Other Clinician: Referring Provider: Fulton Reek Treating Provider/Extender: Tito Dine  in Treatment: 5 Verbal / Phone Orders: No Diagnosis Coding Wound Cleansing Wound #1 Left,Lateral Lower Leg o Clean wound with Normal Saline. Anesthetic (add to Medication List) Wound #1 Left,Lateral Lower Leg o Topical Lidocaine 4% cream applied to wound bed prior to debridement (In Clinic Only). Primary Wound Dressing Wound #1 Left,Lateral Lower Leg o Silver Collagen Secondary Dressing Wound #1 Left,Lateral Lower Leg o ABD pad Dressing Change Frequency Wound #1 Left,Lateral Lower Leg o Change dressing every week o Other: - as needed Follow-up Appointments Wound #1 Left,Lateral Lower Leg o Return Appointment in 1 week. o Nurse Visit as needed - as needed Edema Control Wound #1 Left,Lateral Lower Leg o 3 Layer Compression System - Left Lower Extremity Electronic Signature(s) Signed: 06/21/2018 6:18:32 PM By: Linton Ham MD Signed: 06/21/2018 6:42:35 PM By: Gretta Cool, BSN, RN, CWS, Kim RN, BSN Entered By: Gretta Cool, BSN, RN, CWS, Kim on 06/21/2018 17:24:30 Steven Meza ([id number]) -------------------------------------------------------------------------------- Problem List Details Patient Name: Steven Meza Date of Service: 06/21/2018 1:45 PM Medical Record Number: [id number] Patient Account Number: [id number] Date of Birth/Sex: 08/13/1930 (85 y.o. 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 with other specified 05/17/2018 No Yes severity I89.0 Lymphedema, not elsewhere classified 05/17/2018 No Yes I70.212 Atherosclerosis of native arteries of extremities with 05/17/2018 No Yes intermittent claudication, left leg Inactive Problems Resolved Problems Electronic  Signature(s) Signed: 06/21/2018 6:18:32 PM By: Linton Ham MD Entered By: Linton Ham on 06/21/2018 17:10:58 Steven Meza ([id number]) -------------------------------------------------------------------------------- Progress Note Details Patient Name: Steven Meza Date of Service: 06/21/2018 1:45 PM Medical Record Number: [id number] Patient Account Number: [id number] Date  of Birth/Sex: 1931/01/09 (85 y.o. M) Treating RN: Cornell Barman Primary Care Provider: Fulton Reek Other Clinician: Referring Provider: Fulton Reek Treating Provider/Extender: Tito Dine in Treatment: 5 Subjective History of Present Illness (HPI) ADMISSION 05/17/2018 Mr. Alms is an 85 year old man who is been treated at the lymphedema clinic for a long period with lymphedema wraps for bilateral lower extremity lymphedema. About 6 months ago they noted small open areas on his left lateral calf just above the ankle. These were open. They are apparently putting some form of ointment on this. They have been referred here for our review of this. The patient has a long history of lymphedema and venous stasis. It says in his records that he has recurrent cellulitis although his wife denies this but she states that the lymphedema may have started with cellulitis several years ago. Also in his record there is a history of bladder cancer which they seem to know very little about however he did not have any radiation to the pelvis or anything that could have contributed to lymphedema that they are aware of. There is no prior wound history. The patient has 2 small punched out areas on the left lateral calf that have adherent debris on the surface. Past medical history; lymphedema, hypertension, cellulitis, hearing loss, morbid obesity, osteoarthritis, venous stasis, bladder CA, diverticulosis. ABIs in our clinic were 0.36 on the right and 0.57 on the left 05/24/2018; corrected age on this patient is  19 versus what I said last week. He has been for his arterial studies which predictably are not very good. On the right his ABI is 0.65. Monophasic waveforms noted at the right ankle including the posterior tibial and dorsalis pedis. He has triphasic waveforms of the common femoral and profunda femoris triphasic proximal SFA with monophasic distal FSA and popliteal artery. Monophasic tibial waveforms. On the left his ABI is 0.58 monophasic waveforms at the left ankle. Duplex of the left lower extremity demonstrated atherosclerotic change with monophasic waveforms throughout the left lower extremity. There was some concern about left iliac disease and potentially femoral-popliteal and tibial disease. The patient does not describe claudication although according to his wife he over emphasizes his activity. Patient states he is limited by pain in both knees His wounds are on the left lateral calf. 2 small areas with necrotic debris. We have been using silver collagen and light Ace wraps 05/31/2018; wounds on the left lateral calf in the setting of very significant lymphedema and probably PAD. We have been using silver collagen. The base of the small wound looks reasonably improved. I sent him to see Dr. Fletcher Anon however I see now he has an appointment with Dr. Alvester Chou on February 12 2/5; left lateral calf wound looks the same. His wife is doing a good job with her lymphedema wraps and maintaining that the edema. He most likely has PAD and is due to see Dr. Gwenlyn Found of infectious disease on February 12 2/19; left lateral calf wounds look about the same. This looks like wound secondary to chronic venous insufficiency with lymphedema however the patient has very poor arterial studies. We referred him to Dr. Gwenlyn Found. Dr. Gwenlyn Found did not feel he needed to do anything from an arterial point of view. He did not address the question about the aggressiveness of compression. After some thoughts about this I elected to go  ahead and put him in 3 layer compression which after all would be less compression then the lymphedema clinic was putting on this. I  am hopeful that this should be enough to get some closure of the small wounds Steven, Meza ([id number]) Objective Constitutional Patient is hypertensive.. Pulse regular and within target range for patient.Marland Kitchen Respirations regular, non-labored and within target range.. Temperature is normal and within the target range for the patient.Marland Kitchen appears in no distress. Vitals Time Taken: 1:45 PM, Height: 71 in, Weight: 252 lbs, BMI: 35.1, Temperature: 98.1 F, Pulse: 71 bpm, Respiratory Rate: 18 breaths/min, Blood Pressure: 152/52 mmHg. Psychiatric No evidence of depression, anxiety, or agitation. Calm, cooperative, and communicative. Appropriate interactions and affect.. General Notes: Wound exam; the patient has 2 open wounds on the left lateral calf. I cannot feel pulses in his dorsalis pedis or posterior tibial pulses although I do feel his popliteal pulse. His foot is not particularly cold. Integumentary (Hair, Skin) Wound #1 status is Open. Original cause of wound was Gradually Appeared. The wound is located on the Left,Lateral Lower Leg. The wound measures 1.3cm length x 1.4cm width x 0.4cm depth; 1.429cm^2 area and 0.572cm^3 volume. Assessment Active Problems ICD-10 Non-pressure chronic ulcer of left calf with other specified severity Lymphedema, not elsewhere classified Atherosclerosis of native arteries of extremities with intermittent claudication, left leg Diagnoses ICD-10 L97.228: Non-pressure chronic ulcer of left calf with other specified severity I89.0: Lymphedema, not elsewhere classified I70.212: Atherosclerosis of native arteries of extremities with intermittent claudication, left leg Procedures Wound #1 Pre-procedure diagnosis of Wound #1 is a Lymphedema located on the Left,Lateral Lower Leg . There was a Three Layer Compression Therapy  Procedure with a pre-treatment ABI of 0.6 by Cornell Barman, RN. Post procedure Diagnosis Wound #1: Same as Pre-Procedure Steven, Meza ([id number]) Plan Wound Cleansing: Wound #1 Left,Lateral Lower Leg: Clean wound with Normal Saline. Anesthetic (add to Medication List): Wound #1 Left,Lateral Lower Leg: Topical Lidocaine 4% cream applied to wound bed prior to debridement (In Clinic Only). Primary Wound Dressing: Wound #1 Left,Lateral Lower Leg: Silver Collagen Secondary Dressing: Wound #1 Left,Lateral Lower Leg: ABD pad Dressing Change Frequency: Wound #1 Left,Lateral Lower Leg: Change dressing every week Other: - as needed Follow-up Appointments: Wound #1 Left,Lateral Lower Leg: Return Appointment in 1 week. Nurse Visit as needed - as needed Edema Control: Wound #1 Left,Lateral Lower Leg: 3 Layer Compression System - Left Lower Extremity 1 silver collagen/ABD/3 layer compression 2. Advised him to take this off should there be any pain or discomfort. I will not go to 4 layer compression on him. Electronic Signature(s) Signed: 06/21/2018 5:24:53 PM By: Gretta Cool, BSN, RN, CWS, Kim RN, BSN Signed: 06/21/2018 6:18:32 PM By: Linton Ham MD Entered By: Gretta Cool, BSN, RN, CWS, Kim on 06/21/2018 17:24:53 Steven Meza ([id number]) -------------------------------------------------------------------------------- SuperBill Details Patient Name: Steven Meza Date of Service: 06/21/2018 Medical Record Number: [id number] Patient Account Number: [id number] Date of Birth/Sex: [birth year] (85 y.o. M) Treating RN: Cornell Barman Primary Care Provider: Fulton Reek Other Clinician: Referring Provider: Fulton Reek Treating Provider/Extender: Tito Dine in Treatment: 5 Diagnosis Coding ICD-10 Codes Meza Description [phone] Non-pressure chronic ulcer of left calf with other specified severity I89.0 Lymphedema, not elsewhere classified I70.212 Atherosclerosis of native  arteries of extremities with intermittent claudication, left leg Facility Procedures CPT4 Meza: [id number] Description: (Facility Use Only) 29581LT - APPLY MULTLAY COMPRS LWR LT LEG Modifier: Quantity: 1 Physician Procedures CPT4 Meza: [id number] Description: 37628 - WC PHYS LEVEL 3 - EST PT ICD-10 Diagnosis Description 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

## 2018-06-27 NOTE — Progress Notes (Signed)
Steven, Meza ([id number]) Visit Report for 06/21/2018 Arrival Information Details Patient Name: Steven Meza, Steven Meza Date of Service: 06/21/2018 1:45 PM Medical Record Number: [id number] Patient Account Number: [id number] Date of Birth/Sex: January 10, 1931 (85 y.o. M) Treating RN: Harold Barban Primary Care Riyan Gavina: Fulton Reek Other Clinician: Referring Shawnte Winton: Fulton Reek Treating Jessicaann Overbaugh/Extender: Tito Dine in Treatment: 5 Visit Information History Since Last Visit Added or deleted any medications: No Patient Arrived: Ambulatory Any new allergies or adverse reactions: No Arrival Time: 13:41 Had a fall or experienced change in No Accompanied By: wife activities of daily living that may affect Transfer Assistance: None risk of falls: Patient Identification Verified: Yes Signs or symptoms of abuse/neglect since last visito No Secondary Verification Process Completed: Yes Hospitalized since last visit: No Has Dressing in Place as Prescribed: Yes Pain Present Now: No Electronic Signature(s) Signed: 06/27/2018 11:13:25 AM By: Harold Barban Entered By: Harold Barban on 06/21/2018 13:41:51 Steven Meza ([id number]) -------------------------------------------------------------------------------- Compression Therapy Details Patient Name: Steven Meza Date of Service: 06/21/2018 1:45 PM Medical Record Number: [id number] Patient Account Number: [id number] Date of Birth/Sex: August 24, 1930 (85 y.o. M) Treating RN: Cornell Barman Primary Care Reeves Musick: Fulton Reek Other Clinician: Referring Ilean Spradlin: Fulton Reek Treating Padraic Marinos/Extender: Tito Dine in Treatment: 5 Compression Therapy Performed for Wound Assessment: Wound #1 Left,Lateral Lower Leg Performed By: Clinician Cornell Barman, RN Compression Type: Three Layer Pre Treatment ABI: 0.6 Post Procedure Diagnosis Same as Pre-procedure Electronic Signature(s) Signed: 06/21/2018 6:42:35 PM  By: Gretta Cool, BSN, RN, CWS, Kim RN, BSN Entered By: Gretta Cool, BSN, RN, CWS, Kim on 06/21/2018 14:48:59 Steven Meza ([id number]) -------------------------------------------------------------------------------- Encounter Discharge Information Details Patient Name: Steven Meza Date of Service: 06/21/2018 1:45 PM Medical Record Number: [id number] Patient Account Number: [id number] Date of Birth/Sex: 08/03/1930 (85 y.o. M) Treating RN: Harold Barban Primary Care Nevayah Faust: Fulton Reek Other Clinician: Referring Ithzel Fedorchak: Fulton Reek Treating Odai Wimmer/Extender: Tito Dine in Treatment: 5 Encounter Discharge Information Items Discharge Condition: Stable Ambulatory Status: Cane Discharge Destination: Home Transportation: Private Auto Accompanied By: wife Schedule Follow-up Appointment: Yes Clinical Summary of Care: Electronic Signature(s) Signed: 06/22/2018 8:40:58 AM By: Harold Barban Entered By: Harold Barban on 06/22/2018 08:40:58 Steven Meza ([id number]) -------------------------------------------------------------------------------- Lower Extremity Assessment Details Patient Name: Steven Meza Date of Service: 06/21/2018 1:45 PM Medical Record Number: [id number] Patient Account Number: [id number] Date of Birth/Sex: 03-Mar-1931 (85 y.o. M) Treating RN: Harold Barban Primary Care Griffon Herberg: Fulton Reek Other Clinician: Referring Helix Lafontaine: Fulton Reek Treating Niel Peretti/Extender: Tito Dine in Treatment: 5 Edema Assessment Assessed: [Left: No] [Right: No] [Left: Edema] [Right: :] Calf Left: Right: Point of Measurement: 35 cm From Medial Instep 46.5 cm 44 cm Ankle Left: Right: Point of Measurement: 10 cm From Medial Instep 29 cm 29 cm Vascular Assessment Pulses: Dorsalis Pedis Palpable: [Left:No] Posterior Tibial Palpable: [Left:No] Extremity colors, hair growth, and conditions: Extremity Color: [Left:Red] Temperature  of Extremity: [Left:Warm] Capillary Refill: [Left:< 3 seconds] Toe Nail Assessment Left: Right: Thick: Yes Discolored: Yes Deformed: Yes Improper Length and Hygiene: No Electronic Signature(s) Signed: 06/27/2018 11:13:25 AM By: Harold Barban Entered By: Harold Barban on 06/21/2018 13:50:40 Steven Meza ([id number]) -------------------------------------------------------------------------------- Multi Wound Chart Details Patient Name: Steven Meza Date of Service: 06/21/2018 1:45 PM Medical Record Number: [id number] Patient Account Number: [id number] Date of Birth/Sex: Sep 01, 1930 (85 y.o. M) Treating RN: Cornell Barman Primary Care Amaria Mundorf: Fulton Reek Other Clinician: Referring Haset O[place]aca: Fulton Reek Treating Hosanna Betley/Extender: Tito Dine in Treatment: 5 Vital Signs Height(in):  71 Pulse(bpm): 71 Weight(lbs): 252 Blood Pressure(mmHg): 152/52 Body Mass Index(BMI): 35 Temperature(F): 98.1 Respiratory Rate 18 (breaths/min): Photos: [N/A:N/A] Wound Location: Left, Lateral Lower Leg N/A N/A Wounding Event: Gradually Appeared N/A N/A Primary Etiology: Lymphedema N/A N/A Date Acquired: 11/14/2017 N/A N/A Weeks of Treatment: 5 N/A N/A Wound Status: Open N/A N/A Clustered Wound: Yes N/A N/A Measurements L x W x D 1.3x1.4x0.4 N/A N/A (cm) Area (cm) : 1.429 N/A N/A Volume (cm) : 0.572 N/A N/A % Reduction in Area: 54.50% N/A N/A % Reduction in Volume: -82.20% N/A N/A Classification: Full Thickness Without N/A N/A Exposed Support Structures Periwound Skin Texture: No Abnormalities Noted N/A N/A Periwound Skin Moisture: No Abnormalities Noted N/A N/A Periwound Skin Color: No Abnormalities Noted N/A N/A Tenderness on Palpation: No N/A N/A Procedures Performed: Compression Therapy N/A N/A Treatment Notes Electronic Signature(s) Signed: 06/21/2018 6:18:32 PM By: Linton Ham MD Entered By: Linton Ham on 06/21/2018 17:11:26 Steven Meza  ([id number]) Steven Meza ([id number]) -------------------------------------------------------------------------------- Multi-Disciplinary Care Plan Details Patient Name: Steven Meza Date of Service: 06/21/2018 1:45 PM Medical Record Number: [id number] Patient Account Number: [id number] Date of Birth/Sex: 08/06/1930 (85 y.o. M) Treating RN: Cornell Barman Primary Care Sharee Sturdy: Fulton Reek Other Clinician: Referring Trayvond Viets: Fulton Reek Treating Murrell Dome/Extender: Tito Dine in Treatment: 5 Active Inactive Orientation to the Wound Care Program Nursing Diagnoses: Knowledge deficit related to the wound healing center program Goals: Patient/caregiver will verbalize understanding of the [place] Date Initiated: 05/17/2018 Target Resolution Date: 06/17/2018 Goal Status: Active Interventions: Provide education on orientation to the wound center Notes: Venous Leg Ulcer Nursing Diagnoses: Potential for venous Insuffiency (use before diagnosis confirmed) Goals: Patient/caregiver will verbalize understanding of disease process and disease management Date Initiated: 05/17/2018 Target Resolution Date: 06/17/2018 Goal Status: Active Interventions: Assess peripheral edema status every visit. Treatment Activities: Non-invasive vascular studies : 05/17/2018 Notes: Wound/Skin Impairment Nursing Diagnoses: Impaired tissue integrity Goals: Patient/caregiver will verbalize understanding of skin care regimen Date Initiated: 05/17/2018 Target Resolution Date: 05/17/2018 Goal Status: Active SIMPSON, PAULOS ([id number]) Ulcer/skin breakdown will heal within 14 weeks Date Initiated: 05/17/2018 Target Resolution Date: 08/16/2018 Goal Status: Active Interventions: Assess ulceration(s) every visit Treatment Activities: Referred to DME Esli Jernigan for dressing supplies : 05/17/2018 Topical wound management initiated : 05/17/2018 Notes: Electronic  Signature(s) Signed: 06/21/2018 6:42:35 PM By: Gretta Cool, BSN, RN, CWS, Kim RN, BSN Entered By: Gretta Cool, BSN, RN, CWS, Kim on 06/21/2018 14:47:18 Steven Meza ([id number]) -------------------------------------------------------------------------------- Pain Assessment Details Patient Name: Steven Meza Date of Service: 06/21/2018 1:45 PM Medical Record Number: [id number] Patient Account Number: [id number] Date of Birth/Sex: [birth year] (85 y.o. M) Treating RN: Harold Barban Primary Care Callaway Hardigree: Fulton Reek Other Clinician: Referring Geoge Lawrance: Fulton Reek Treating Raffaele Derise/Extender: Tito Dine in Treatment: 5 Active Problems Location of Pain Severity and Description of Pain Patient Has Paino No Site Locations Pain Management and Medication Current Pain Management: Electronic Signature(s) Signed: 06/27/2018 11:13:25 AM By: Harold Barban Entered By: Harold Barban on 06/21/2018 13:42:04 Steven Meza ([id number]) -------------------------------------------------------------------------------- Patient/Caregiver Education Details Patient Name: Steven Meza Date of Service: 06/21/2018 1:45 PM Medical Record Number: [id number] Patient Account Number: [id number] Date of Birth/Gender: Sep 20, 1930 (85 y.o. M) Treating RN: Cornell Barman Primary Care Physician: Fulton Reek Other Clinician: Referring Physician: Fulton Reek Treating Physician/Extender: Tito Dine in Treatment: 5 Education Assessment Education Provided To: Patient Education Topics Provided Venous: Handouts: Controlling Swelling with Multilayered Compression Wraps Methods: Demonstration, Explain/Verbal Responses: State content correctly Wound/Skin Impairment: Handouts:  Caring for Your Ulcer Methods: Demonstration, Explain/Verbal Responses: State content correctly Electronic Signature(s) Signed: 06/27/2018 11:13:25 AM By: Harold Barban Entered By: Harold Barban on  06/22/2018 08:41:03 Steven Meza ([id number]) -------------------------------------------------------------------------------- Wound Assessment Details Patient Name: Steven Meza Date of Service: 06/21/2018 1:45 PM Medical Record Number: [id number] Patient Account Number: [id number] Date of Birth/Sex: December 19, 1930 (85 y.o. M) Treating RN: Harold Barban Primary Care Tekeshia Klahr: Fulton Reek Other Clinician: Referring Jessikah Dicker: Fulton Reek Treating Imre Vecchione/Extender: Tito Dine in Treatment: 5 Wound Status Wound Number: 1 Primary Etiology: Lymphedema Wound Location: Left, Lateral Lower Leg Wound Status: Open Wounding Event: Gradually Appeared Date Acquired: 11/14/2017 Weeks Of Treatment: 5 Clustered Wound: Yes Photos Photo Uploaded By: Montey Hora on 06/21/2018 16:37:37 Wound Measurements Length: (cm) 1.3 Width: (cm) 1.4 Depth: (cm) 0.4 Area: (cm) 1.429 Volume: (cm) 0.572 % Reduction in Area: 54.5% % Reduction in Volume: -82.2% Wound Description Full Thickness Without Exposed Support Classification: Structures Periwound Skin Texture Texture Color No Abnormalities Noted: No No Abnormalities Noted: No Moisture No Abnormalities Noted: No Treatment Notes Wound #1 (Left, Lateral Lower Leg) Notes prisma, ABD, sock Electronic Signature(s) JODEN, BONSALL ([id number]) Signed: 06/27/2018 11:13:25 AM By: Harold Barban Entered By: Harold Barban on 06/21/2018 13:51:24 Steven Meza ([id number]) -------------------------------------------------------------------------------- Vitals Details Patient Name: Steven Meza Date of Service: 06/21/2018 1:45 PM Medical Record Number: [id number] Patient Account Number: [id number] Date of Birth/Sex: 11-25-1930 (85 y.o. M) Treating RN: Harold Barban Primary Care Owen Pratte: Fulton Reek Other Clinician: Referring Frida Wahlstrom: Fulton Reek Treating Curtez Brallier/Extender: Tito Dine in  Treatment: 5 Vital Signs Time Taken: 13:45 Temperature (F): 98.1 Height (in): 71 Pulse (bpm): 71 Weight (lbs): 252 Respiratory Rate (breaths/min): 18 Body Mass Index (BMI): 35.1 Blood Pressure (mmHg): 152/52 Reference Range: 80 - 120 mg / dl Electronic Signature(s) Signed: 06/27/2018 11:13:25 AM By: Harold Barban Entered By: Harold Barban on 06/21/2018 13:48:29

## 2018-06-28 ENCOUNTER — Encounter: Payer: Medicare Other | Admitting: Internal Medicine

## 2018-06-28 DIAGNOSIS — I890 Lymphedema, not elsewhere classified: Secondary | ICD-10-CM | POA: Diagnosis not present

## 2018-06-29 NOTE — Progress Notes (Signed)
GLYN, GERADS ([id number]) Visit Report for 06/28/2018 HPI Details Patient Name: Steven Meza, Steven Meza Date of Service: 06/28/2018 3:45 PM Medical Record Number: [id number] Patient Account Number: [id number] Date of Birth/Sex: [birth year] (85 y.o. M) Treating RN: Cornell Barman Primary Care Provider: Fulton Reek Other Clinician: Referring Provider: Fulton Reek Treating Provider/Extender: Tito Dine in Treatment: 6 History of Present Illness HPI Description: ADMISSION 05/17/2018 Steven Meza is an 85 year old man who is been treated at the lymphedema clinic for a long period with lymphedema wraps for bilateral lower extremity lymphedema. About 6 months ago they noted small open areas on his left lateral calf just above the ankle. These were open. They are apparently putting some form of ointment on this. They have been referred here for our review of this. The patient has a long history of lymphedema and venous stasis. It says in his records that he has recurrent cellulitis although his wife denies this but she states that the lymphedema may have started with cellulitis several years ago. Also in his record there is a history of bladder cancer which they seem to know very little about however he did not have any radiation to the pelvis or anything that could have contributed to lymphedema that they are aware of. There is no prior wound history. The patient has 2 small punched out areas on the left lateral calf that have adherent debris on the surface. Past medical history; lymphedema, hypertension, cellulitis, hearing loss, morbid obesity, osteoarthritis, venous stasis, bladder CA, diverticulosis. ABIs in our clinic were 0.36 on the right and 0.57 on the left 05/24/2018; corrected age on this patient is 85 versus what I said last week. He has been for his arterial studies which predictably are not very good. On the right his ABI is 0.65. Monophasic waveforms noted at the right ankle  including the posterior tibial and dorsalis pedis. He has triphasic waveforms of the common femoral and profunda femoris triphasic proximal SFA with monophasic distal FSA and popliteal artery. Monophasic tibial waveforms. On the left his ABI is 0.58 monophasic waveforms at the left ankle. Duplex of the left lower extremity demonstrated atherosclerotic change with monophasic waveforms throughout the left lower extremity. There was some concern about left iliac disease and potentially femoral-popliteal and tibial disease. The patient does not describe claudication although according to his wife he over emphasizes his activity. Patient states he is limited by pain in both knees His wounds are on the left lateral calf. 2 small areas with necrotic debris. We have been using silver collagen and light Ace wraps 05/31/2018; wounds on the left lateral calf in the setting of very significant lymphedema and probably PAD. We have been using silver collagen. The base of the small wound looks reasonably improved. I sent him to see Dr. Fletcher Anon however I see now he has an appointment with Dr. Alvester Chou on February 12 2/5; left lateral calf wound looks the same. His wife is doing a good job with her lymphedema wraps and maintaining that the edema. He most likely has PAD and is due to see Dr. Gwenlyn Found of infectious disease on February 12 2/19; left lateral calf wounds look about the same. This looks like wound secondary to chronic venous insufficiency with lymphedema however the patient has very poor arterial studies. We referred him to Dr. Gwenlyn Found. Dr. Gwenlyn Found did not feel he needed to do anything from an arterial point of view. He did not address the question about the aggressiveness of compression. After some thoughts about  this I elected to go ahead and put him in 3 layer compression which after all would be less compression then the lymphedema clinic was putting on this. I am hopeful that this should be enough to get  some closure of the small wounds 2/26; left lateral calf wound letter this week. He tolerated the 3 layer compression well furthermore he is sleeping in a hospital bed which helps keep his legs up at night. The wound looks better measuring smaller especially in width Steven Meza, Steven Meza ([id number]) Electronic Signature(s) Signed: 06/28/2018 4:42:30 PM By: Linton Ham MD Entered By: Linton Ham on 06/28/2018 16:27:25 Steven Meza ([id number]) -------------------------------------------------------------------------------- Physical Exam Details Patient Name: Steven Meza Date of Service: 06/28/2018 3:45 PM Medical Record Number: [id number] Patient Account Number: [id number] Date of Birth/Sex: 1930-11-30 (85 y.o. M) Treating RN: Cornell Barman Primary Care Provider: Fulton Reek Other Clinician: Referring Provider: Fulton Reek Treating Provider/Extender: Tito Dine in Treatment: 6 Constitutional Patient is hypertensive.. Pulse regular and within target range for patient.Marland Kitchen Respirations regular, non-labored and within target range.. Temperature is normal and within the target range for the patient.Marland Kitchen appears in no distress. Eyes Conjunctivae clear. No discharge. Respiratory Respiratory effort is easy and symmetric bilaterally. Rate is normal at rest and on room air.. Cardiovascular Pedal pulses absent bilaterally.. Lymphatic None palpable in the popliteal area bilaterally. Integumentary (Hair, Skin) And is a chronic venous insufficiency and lymphedema in the skin of his lower extremities otherwise no other cutaneous issues. Psychiatric No evidence of depression, anxiety, or agitation. Calm, cooperative, and communicative. Appropriate interactions and affect.. Notes Wound exam; the patient has 2 open areas on the left lateral calf. It is really difficult to even determine the second 1. His remaining wound is small some depth better looking wound surface. The  edema control in his leg is a lot better Electronic Signature(s) Signed: 06/28/2018 4:42:30 PM By: Linton Ham MD Entered By: Linton Ham on 06/28/2018 16:28:45 Steven Meza ([id number]) -------------------------------------------------------------------------------- Physician Orders Details Patient Name: Steven Meza Date of Service: 06/28/2018 3:45 PM Medical Record Number: [id number] Patient Account Number: [id number] Date of Birth/Sex: 08/20/1930 (85 y.o. M) Treating RN: Cornell Barman Primary Care Provider: Fulton Reek Other Clinician: Referring Provider: Fulton Reek Treating Provider/Extender: Tito Dine in Treatment: 6 Verbal / Phone Orders: No Diagnosis Coding Wound Cleansing Wound #1 Left,Lateral Lower Leg o Clean wound with Normal Saline. Anesthetic (add to Medication List) Wound #1 Left,Lateral Lower Leg o Topical Lidocaine 4% cream applied to wound bed prior to debridement (In Clinic Only). Primary Wound Dressing Wound #1 Left,Lateral Lower Leg o Silver Collagen Secondary Dressing Wound #1 Left,Lateral Lower Leg o ABD pad Dressing Change Frequency Wound #1 Left,Lateral Lower Leg o Change dressing every week o Other: - as needed Follow-up Appointments Wound #1 Left,Lateral Lower Leg o Return Appointment in 1 week. o Nurse Visit as needed - as needed Edema Control Wound #1 Left,Lateral Lower Leg o 3 Layer Compression System - Left Lower Extremity Electronic Signature(s) Signed: 06/28/2018 4:42:30 PM By: Linton Ham MD Signed: 06/28/2018 5:55:39 PM By: Gretta Cool, BSN, RN, CWS, Kim RN, BSN Entered By: Gretta Cool, BSN, RN, CWS, Kim on 06/28/2018 16:22:28 Steven Meza ([id number]) -------------------------------------------------------------------------------- Problem List Details Patient Name: Steven Meza Date of Service: 06/28/2018 3:45 PM Medical Record Number: [id number] Patient Account Number:  [id number] Date of Birth/Sex: 25-Feb-1931 (85 y.o. 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 with other specified 05/17/2018 No Yes severity I89.0 Lymphedema, not elsewhere classified 05/17/2018 No Yes I70.212 Atherosclerosis of native arteries of extremities with 05/17/2018 No Yes intermittent claudication, left leg Inactive Problems Resolved Problems Electronic Signature(s) Signed: 06/28/2018 4:42:30 PM By: Linton Ham MD Entered By: Linton Ham on 06/28/2018 16:26:35 Steven Meza ([id number]) -------------------------------------------------------------------------------- Progress Note Details Patient Name: Steven Meza Date of Service: 06/28/2018 3:45 PM Medical Record Number: [id number] Patient Account Number: [id number] Date of Birth/Sex: 1930-10-12 (85 y.o. M) Treating RN: Cornell Barman Primary Care Provider: Fulton Reek Other Clinician: Referring Provider: Fulton Reek Treating Provider/Extender: Tito Dine in Treatment: 6 Subjective History of Present Illness (HPI) ADMISSION 05/17/2018 Mr. Sangiovanni is an 85 year old man who is been treated at the lymphedema clinic for a long period with lymphedema wraps for bilateral lower extremity lymphedema. About 6 months ago they noted small open areas on his left lateral calf just above the ankle. These were open. They are apparently putting some form of ointment on this. They have been referred here for our review of this. The patient has a long history of lymphedema and venous stasis. It says in his records that he has recurrent cellulitis although his wife denies this but she states that the lymphedema may have started with  cellulitis several years ago. Also in his record there is a history of bladder cancer which they seem to know very little about however he did not have any radiation to the pelvis or anything that could have contributed to lymphedema that they are aware of. There is no prior wound history. The patient has 2 small punched out areas on the left lateral calf that have adherent debris on the surface. Past medical history; lymphedema, hypertension, cellulitis, hearing loss, morbid obesity, osteoarthritis, venous stasis, bladder CA, diverticulosis. ABIs in our clinic were 0.36 on the right and 0.57 on the left 05/24/2018; corrected age on this patient is 70 versus what I said last week. He has been for his arterial studies which predictably are not very good. On the right his ABI is 0.65. Monophasic waveforms noted at the right ankle including the posterior tibial and dorsalis pedis. He has triphasic waveforms of the common femoral and profunda femoris triphasic proximal SFA with monophasic distal FSA and popliteal artery. Monophasic tibial waveforms. On the left his ABI is 0.58 monophasic waveforms at the left ankle. Duplex of the left lower extremity demonstrated atherosclerotic change with monophasic waveforms throughout the left lower extremity. There was some concern about left iliac disease and potentially femoral-popliteal and tibial disease. The patient does not describe claudication although according to his wife he over emphasizes his activity. Patient states he is limited by pain in both knees His wounds are on the left lateral calf. 2 small areas with necrotic debris. We have been using silver collagen and light Ace wraps 05/31/2018; wounds on the left lateral calf in the setting of very significant lymphedema and probably PAD. We have been using silver collagen. The base of the small wound looks reasonably improved. I sent him to see Dr. Fletcher Anon however I see now he has an appointment with Dr.  Alvester Chou on February 12 2/5; left lateral calf wound looks the same. His wife is doing a good job with her lymphedema wraps and maintaining that the edema. He most likely has PAD and is due to see  Dr. Gwenlyn Found of infectious disease on February 12 2/19; left lateral calf wounds look about the same. This looks like wound secondary to chronic venous insufficiency with lymphedema however the patient has very poor arterial studies. We referred him to Dr. Gwenlyn Found. Dr. Gwenlyn Found did not feel he needed to do anything from an arterial point of view. He did not address the question about the aggressiveness of compression. After some thoughts about this I elected to go ahead and put him in 3 layer compression which after all would be less compression then the lymphedema clinic was putting on this. I am hopeful that this should be enough to get some closure of the small wounds 2/26; left lateral calf wound letter this week. He tolerated the 3 layer compression well furthermore he is sleeping in a hospital bed which helps keep his legs up at night. The wound looks better measuring smaller especially in width Steven Meza, Steven A. ([id number]) Objective Constitutional Patient is hypertensive.. Pulse regular and within target range for patient.Marland Kitchen Respirations regular, non-labored and within target range.. Temperature is normal and within the target range for the patient.Marland Kitchen appears in no distress. Vitals Time Taken: 4:00 PM, Height: 71 in, Weight: 252 lbs, BMI: 35.1, Temperature: 98.5 F, Pulse: 83 bpm, Respiratory Rate: 20 breaths/min, Blood Pressure: 154/68 mmHg. Eyes Conjunctivae clear. No discharge. Respiratory Respiratory effort is easy and symmetric bilaterally. Rate is normal at rest and on room air.. Cardiovascular Pedal pulses absent bilaterally.. Lymphatic None palpable in the popliteal area bilaterally. Psychiatric No evidence of depression, anxiety, or agitation. Calm, cooperative, and communicative.  Appropriate interactions and affect.. General Notes: Wound exam; the patient has 2 open areas on the left lateral calf. It is really difficult to even determine the second 1. His remaining wound is small some depth better looking wound surface. The edema control in his leg is a lot better Integumentary (Hair, Skin) And is a chronic venous insufficiency and lymphedema in the skin of his lower extremities otherwise no other cutaneous issues. Wound #1 status is Open. Original cause of wound was Gradually Appeared. The wound is located on the Left,Lateral Lower Leg. The wound measures 1.2cm length x 1cm width x 0.3cm depth; 0.942cm^2 area and 0.283cm^3 volume. There is Fat Layer (Subcutaneous Tissue) Exposed exposed. There is no tunneling or undermining noted. There is small (1-33%) pink granulation within the wound bed. There is a large (67-100%) amount of necrotic tissue within the wound bed including Adherent Slough. Assessment Active Problems ICD-10 Non-pressure chronic ulcer of left calf with other specified severity Lymphedema, not elsewhere classified Atherosclerosis of native arteries of extremities with intermittent claudication, left leg Steven Meza, Steven Meza ([id number]) Diagnoses ICD-10 L97.228: Non-pressure chronic ulcer of left calf with other specified severity I89.0: Lymphedema, not elsewhere classified I70.212: Atherosclerosis of native arteries of extremities with intermittent claudication, left leg Plan Wound Cleansing: Wound #1 Left,Lateral Lower Leg: Clean wound with Normal Saline. Anesthetic (add to Medication List): Wound #1 Left,Lateral Lower Leg: Topical Lidocaine 4% cream applied to wound bed prior to debridement (In Clinic Only). Primary Wound Dressing: Wound #1 Left,Lateral Lower Leg: Silver Collagen Secondary Dressing: Wound #1 Left,Lateral Lower Leg: ABD pad Dressing Change Frequency: Wound #1 Left,Lateral Lower Leg: Change dressing every week Other: - as  needed Follow-up Appointments: Wound #1 Left,Lateral Lower Leg: Return Appointment in 1 week. Nurse Visit as needed - as needed Edema Control: Wound #1 Left,Lateral Lower Leg: 3 Layer Compression System - Left Lower Extremity 1. We continue with silver collagen/ABDs 3 layer  compression Electronic Signature(s) Signed: 06/28/2018 4:42:30 PM By: Linton Ham MD Entered By: Linton Ham on 06/28/2018 16:29:39 Steven Meza ([id number]) -------------------------------------------------------------------------------- SuperBill Details Patient Name: Steven Meza Date of Service: 06/28/2018 Medical Record Number: [id number] Patient Account Number: [id number] Date of Birth/Sex: 1930/07/17 (85 y.o. M) Treating RN: Cornell Barman Primary Care Provider: Fulton Reek Other Clinician: Referring Provider: Fulton Reek Treating Provider/Extender: Tito Dine in Treatment: 6 Diagnosis Coding ICD-10 Codes Meza Description [phone] Non-pressure chronic ulcer of left calf with other specified severity I89.0 Lymphedema, not elsewhere classified I70.212 Atherosclerosis of native arteries of extremities with intermittent claudication, left leg Facility Procedures CPT4 Meza: [id number] Description: (Facility Use Only) [phone] - [facility] COMPRS LWR LT LEG Modifier: Quantity: 1 Physician Procedures CPT4 Meza Description: [id number] - WC PHYS LEVEL 3 - EST PT ICD-10 Diagnosis Description I89.0 Lymphedema, not elsewhere classified L97.228 Non-pressure chronic ulcer of left calf with other specified seve I70.212 Atherosclerosis of native arteries  of extremities with intermitte Modifier: rity nt claudication, l Quantity: 1 eft leg Electronic Signature(s) Signed: 06/28/2018 4:42:30 PM By: Linton Ham MD Entered By: Linton Ham on 06/28/2018 16:30:00

## 2018-06-30 NOTE — Progress Notes (Signed)
SULLY, DYMENT ([id number]) Visit Report for 06/28/2018 Arrival Information Details Patient Name: MAYCO, WALROND Date of Service: 06/28/2018 3:45 PM Medical Record Number: [id number] Patient Account Number: [id number] Date of Birth/Sex: 24-Apr-1931 (85 y.o. M) Treating RN: Harold Barban Primary Care Louie Flenner: Fulton Reek Other Clinician: Referring Epifanio Labrador: Fulton Reek Treating Victoriano Campion/Extender: Tito Dine in Treatment: 6 Visit Information History Since Last Visit Added or deleted any medications: No Patient Arrived: Cane Any new allergies or adverse reactions: No Arrival Time: 15:59 Had a fall or experienced change in No Accompanied By: wife activities of daily living that may affect Transfer Assistance: None risk of falls: Patient Identification Verified: Yes Signs or symptoms of abuse/neglect since last visito No Secondary Verification Process Completed: Yes Hospitalized since last visit: No Has Dressing in Place as Prescribed: Yes Has Compression in Place as Prescribed: Yes Pain Present Now: No Electronic Signature(s) Signed: 06/29/2018 7:54:32 AM By: Harold Barban Entered By: Harold Barban on 06/28/2018 16:00:13 [name] ([id number]) -------------------------------------------------------------------------------- Encounter Discharge Information Details Patient Name: [name] Date of Service: 06/28/2018 3:45 PM Medical Record Number: [id number] Patient Account Number: [id number] Date of Birth/Sex: 24-Oct-1930 (85 y.o. M) Treating RN: Army Melia Primary Care Maverick Dieudonne: Fulton Reek Other Clinician: Referring Cameshia Cressman: Fulton Reek Treating Nadiya Pieratt/Extender: Tito Dine in Treatment: 6 Encounter Discharge Information Items Discharge Condition: Stable Ambulatory Status: Cane Discharge Destination: Home Transportation: Private Auto Accompanied By: wife Schedule Follow-up Appointment: Yes Clinical Summary of  Care: Electronic Signature(s) Signed: 06/28/2018 4:39:14 PM By: Army Melia Entered By: Army Melia on 06/28/2018 16:39:14 [name] ([id number]) -------------------------------------------------------------------------------- Lower Extremity Assessment Details Patient Name: [name] Date of Service: 06/28/2018 3:45 PM Medical Record Number: [id number] Patient Account Number: [id number] Date of Birth/Sex: 02/05/1931 (85 y.o. M) Treating RN: Harold Barban Primary Care Larenda Reedy: Fulton Reek Other Clinician: Referring Kaliopi Blyden: Fulton Reek Treating Mac Dowdell/Extender: Tito Dine in Treatment: 6 Edema Assessment Assessed: [Left: No] [Right: No] [Left: Edema] [Right: :] Calf Left: Right: Point of Measurement: 35 cm From Medial Instep 45 cm cm Ankle Left: Right: Point of Measurement: 12 cm From Medial Instep 28 cm cm Vascular Assessment Pulses: Dorsalis Pedis Palpable: [Left:Yes] Posterior Tibial Palpable: [Left:Yes] Extremity colors, hair growth, and conditions: Extremity Color: [Left:Hyperpigmented] Hair Growth on Extremity: [Left:No] Temperature of Extremity: [Left:Warm] Capillary Refill: [Left:< 3 seconds] Toe Nail Assessment Left: Right: Thick: Yes Discolored: Yes Deformed: Yes Improper Length and Hygiene: Yes Electronic Signature(s) Signed: 06/29/2018 7:54:32 AM By: Harold Barban Entered By: Harold Barban on 06/28/2018 16:17:42 [name] ([id number]) -------------------------------------------------------------------------------- Multi Wound Chart Details Patient Name: [name] Date of Service: 06/28/2018 3:45 PM Medical Record Number: [id number] Patient Account Number: [id number] Date of Birth/Sex: 1930-11-12 (85 y.o. M) Treating RN: Cornell Barman Primary Care Kearah Gayden: Fulton Reek Other Clinician: Referring Raevyn Sokol: Fulton Reek Treating Myalee Stengel/Extender: Tito Dine in Treatment: 6 Vital  Signs Height(in): 71 Pulse(bpm): 83 Weight(lbs): 252 Blood Pressure(mmHg): 154/68 Body Mass Index(BMI): 35 Temperature(F): 98.5 Respiratory Rate 20 (breaths/min): Photos: [1:No Photos] [N/A:N/A] Wound Location: [1:Left Lower Leg - Lateral] [N/A:N/A] Wounding Event: [1:Gradually Appeared] [N/A:N/A] Primary Etiology: [1:Lymphedema] [N/A:N/A] Comorbid History: [1:Lymphedema, Coronary Artery N/A Disease, Hypertension] Date Acquired: [1:11/14/2017] [N/A:N/A] Weeks of Treatment: [1:6] [N/A:N/A] Wound Status: [1:Open] [N/A:N/A] Clustered Wound: [1:Yes] [N/A:N/A] Measurements L x W x D [1:1.2x1x0.3] [N/A:N/A] (cm) Area (cm) : [1:0.942] [N/A:N/A] Volume (cm) : [1:0.283] [N/A:N/A] % Reduction in Area: [1:70.00%] [N/A:N/A] % Reduction in Volume: [1:9.90%] [N/A:N/A] Classification: [1:Full Thickness Without Exposed Support Structures] [N/A:N/A]  Granulation Amount: [1:Small (1-33%)] [N/A:N/A] Granulation Quality: [1:Pink] [N/A:N/A] Necrotic Amount: [1:Large (67-100%)] [N/A:N/A] Exposed Structures: [1:Fat Layer (Subcutaneous Tissue) Exposed: Yes] [N/A:N/A] Periwound Skin Texture: [1:No Abnormalities Noted] [N/A:N/A] Periwound Skin Moisture: [1:No Abnormalities Noted] [N/A:N/A] Periwound Skin Color: [1:No Abnormalities Noted] [N/A:N/A] Tenderness on Palpation: [1:No] [N/A:N/A] Wound Preparation: [1:Ulcer Cleansing: Rinsed/Irrigated with Saline] [N/A:N/A] Topical Anesthetic Applied: Other: lidocaine 4% Treatment Notes Electronic Signature(s) TOLBERT, MATHESON ([id number]) Signed: 06/28/2018 4:42:30 PM By: Linton Ham MD Entered By: Linton Ham on 06/28/2018 16:26:44 [name] ([id number]) -------------------------------------------------------------------------------- Multi-Disciplinary Care Plan Details Patient Name: [name] Date of Service: 06/28/2018 3:45 PM Medical Record Number: [id number] Patient Account Number: [id number] Date of Birth/Sex: 07-28-1930  (85 y.o. M) Treating RN: Cornell Barman Primary Care Jakyle Petrucelli: Fulton Reek Other Clinician: Referring Zachrey Deutscher: Fulton Reek Treating Shynia Daleo/Extender: Tito Dine in Treatment: 6 Active Inactive Orientation to the Wound Care Program Nursing Diagnoses: Knowledge deficit related to the wound healing center program Goals: Patient/caregiver will verbalize understanding of the [facility] Date Initiated: 05/17/2018 Target Resolution Date: 06/17/2018 Goal Status: Active Interventions: Provide education on orientation to the wound center Notes: Venous Leg Ulcer Nursing Diagnoses: Potential for venous Insuffiency (use before diagnosis confirmed) Goals: Patient/caregiver will verbalize understanding of disease process and disease management Date Initiated: 05/17/2018 Target Resolution Date: 06/17/2018 Goal Status: Active Interventions: Assess peripheral edema status every visit. Treatment Activities: Non-invasive vascular studies : 05/17/2018 Notes: Wound/Skin Impairment Nursing Diagnoses: Impaired tissue integrity Goals: Patient/caregiver will verbalize understanding of skin care regimen Date Initiated: 05/17/2018 Target Resolution Date: 05/17/2018 Goal Status: Active ROMEO, ZIELINSKI ([id number]) Ulcer/skin breakdown will heal within 14 weeks Date Initiated: 05/17/2018 Target Resolution Date: 08/16/2018 Goal Status: Active Interventions: Assess ulceration(s) every visit Treatment Activities: Referred to DME [facility] for dressing supplies : 05/17/2018 Topical wound management initiated : 05/17/2018 Notes: Electronic Signature(s) Signed: 06/28/2018 5:55:39 PM By: Gretta Cool, BSN, RN, CWS, Kim RN, BSN Entered By: Gretta Cool, BSN, RN, CWS, Kim on 06/28/2018 16:21:25 [name] ([id number]) -------------------------------------------------------------------------------- Pain Assessment Details Patient Name: [name] Date of Service: 06/28/2018  3:45 PM Medical Record Number: [id number] Patient Account Number: [id number] Date of Birth/Sex: 09/25/1930 (85 y.o. M) Treating RN: Harold Barban Primary Care Adithya Difrancesco: Fulton Reek Other Clinician: Referring Mykale Gandolfo: Fulton Reek Treating Saira Kramme/Extender: Tito Dine in Treatment: 6 Active Problems Location of Pain Severity and Description of Pain Patient Has Paino No Site Locations Pain Management and Medication Current Pain Management: Electronic Signature(s) Signed: 06/29/2018 7:54:32 AM By: Harold Barban Entered By: Harold Barban on 06/28/2018 16:00:20 [name] ([id number]) -------------------------------------------------------------------------------- Patient/Caregiver Education Details Patient Name: [name] Date of Service: 06/28/2018 3:45 PM Medical Record Number: [id number] Patient Account Number: [id number] Date of Birth/Gender: 02/16/1931 (85 y.o. M) Treating RN: Cornell Barman Primary Care Physician: Fulton Reek Other Clinician: Referring Physician: Fulton Reek Treating Physician/Extender: Tito Dine in Treatment: 6 Education Assessment Education Provided To: Patient Education Topics Provided Venous: Handouts: Controlling Swelling with Multilayered Compression Wraps Methods: Demonstration, Explain/Verbal Responses: State content correctly Wound/Skin Impairment: Handouts: Caring for Your Ulcer Methods: Demonstration, Explain/Verbal Responses: State content correctly Electronic Signature(s) Signed: 06/28/2018 5:55:39 PM By: Gretta Cool, BSN, RN, CWS, Kim RN, BSN Entered By: Gretta Cool, BSN, RN, CWS, Kim on 06/28/2018 16:24:14 [name] ([id number]) -------------------------------------------------------------------------------- Wound Assessment Details Patient Name: [name] Date of Service: 06/28/2018 3:45 PM Medical Record Number: [id number] Patient Account Number: [id number] Date of Birth/Sex:  Sep 28, 1930 (85 y.o. M) Treating RN: Harold Barban Primary Care Aadi Bordner: Doy Hutching,  Dellis Filbert Other Clinician: Referring Rosalind Guido: Fulton Reek Treating Hetty Linhart/Extender: Tito Dine in Treatment: 6 Wound Status Wound Number: 1 Primary Lymphedema Etiology: Wound Location: Left Lower Leg - Lateral Wound Status: Open Wounding Event: Gradually Appeared Comorbid Lymphedema, Coronary Artery Disease, Date Acquired: 11/14/2017 History: Hypertension Weeks Of Treatment: 6 Clustered Wound: Yes Photos Photo Uploaded By: Harold Barban on 06/28/2018 16:29:43 Wound Measurements Length: (cm) 1.2 Width: (cm) 1 Depth: (cm) 0.3 Area: (cm) 0.942 Volume: (cm) 0.283 % Reduction in Area: 70% % Reduction in Volume: 9.9% Tunneling: No Undermining: No Wound Description Full Thickness Without Exposed Support Classification: Structures Foul Odor After Cleansing: No Slough/Fibrino Yes Wound Bed Granulation Amount: Small (1-33%) Exposed Structure Granulation Quality: Pink Fat Layer (Subcutaneous Tissue) Exposed: Yes Necrotic Amount: Large (67-100%) Necrotic Quality: Adherent Slough Periwound Skin Texture Texture Color No Abnormalities Noted: No No Abnormalities Noted: No Moisture No Abnormalities Noted: No Wound Preparation HERMAN, FIERO ([id number]) Ulcer Cleansing: Rinsed/Irrigated with Saline Topical Anesthetic Applied: Other: lidocaine 4%, Treatment Notes Wound #1 (Left, Lateral Lower Leg) Notes prisma, ABD, 3 layer Electronic Signature(s) Signed: 06/29/2018 7:54:32 AM By: Harold Barban Entered By: Harold Barban on 06/28/2018 16:15:43 [name] ([id number]) -------------------------------------------------------------------------------- Vitals Details Patient Name: [name] Date of Service: 06/28/2018 3:45 PM Medical Record Number: [id number] Patient Account Number: [id number] Date of Birth/Sex: Aug 20, 1930 (85 y.o. M) Treating RN: Harold Barban Primary Care Irelynn Schermerhorn: Fulton Reek Other Clinician: Referring Belle Charlie: Fulton Reek Treating Janavia Rottman/Extender: Tito Dine in Treatment: 6 Vital Signs Time Taken: 16:00 Temperature (F): 98.5 Height (in): 71 Pulse (bpm): 83 Weight (lbs): 252 Respiratory Rate (breaths/min): 20 Body Mass Index (BMI): 35.1 Blood Pressure (mmHg): 154/68 Reference Range: 80 - 120 mg / dl Electronic Signature(s) Signed: 06/29/2018 7:54:32 AM By: Harold Barban Entered By: Harold Barban on 06/28/2018 16:04:27

## 2018-07-05 ENCOUNTER — Encounter: Payer: Medicare Other | Attending: Internal Medicine | Admitting: Internal Medicine

## 2018-07-05 DIAGNOSIS — Z8551 Personal history of malignant neoplasm of bladder: Secondary | ICD-10-CM | POA: Insufficient documentation

## 2018-07-05 DIAGNOSIS — I872 Venous insufficiency (chronic) (peripheral): Secondary | ICD-10-CM | POA: Insufficient documentation

## 2018-07-05 DIAGNOSIS — K5790 Diverticulosis of intestine, part unspecified, without perforation or abscess without bleeding: Secondary | ICD-10-CM | POA: Insufficient documentation

## 2018-07-05 DIAGNOSIS — I10 Essential (primary) hypertension: Secondary | ICD-10-CM | POA: Diagnosis not present

## 2018-07-05 DIAGNOSIS — M1990 Unspecified osteoarthritis, unspecified site: Secondary | ICD-10-CM | POA: Insufficient documentation

## 2018-07-05 DIAGNOSIS — I70212 Atherosclerosis of native arteries of extremities with intermittent claudication, left leg: Secondary | ICD-10-CM | POA: Diagnosis not present

## 2018-07-05 DIAGNOSIS — I890 Lymphedema, not elsewhere classified: Secondary | ICD-10-CM | POA: Diagnosis not present

## 2018-07-07 NOTE — Progress Notes (Signed)
Steven Meza, Steven Meza ([id number]) Visit Report for 07/05/2018 Debridement Details Patient Name: Steven, Meza Date of Service: 07/05/2018 2:30 PM Medical Record Number: [id number] Patient Account Number: [id number] Date of Birth/Sex: Nov 05, 1930 (85 y.o. M) Treating RN: Cornell Barman Primary Care Provider: Fulton Reek Other Clinician: Referring Provider: Fulton Reek Treating Provider/Extender: Tito Dine in Treatment: 7 Debridement Performed for Wound #1 Left,Lateral Lower Leg Assessment: Performed By: Physician Ricard Dillon, MD Debridement Type: Debridement Level of Consciousness (Pre- Awake and Alert procedure): Pre-procedure Verification/Time Yes - 15:13 Out Taken: Start Time: 15:13 Pain Control: Lidocaine Total Area Debrided (L x W): 1.2 (cm) x 1 (cm) = 1.2 (cm) Tissue and other material Viable, Non-Viable, Slough, Subcutaneous, Slough debrided: Level: Skin/Subcutaneous Tissue Debridement Description: Excisional Instrument: Curette Bleeding: Moderate Hemostasis Achieved: Silver Nitrate End Time: 15:16 Response to Treatment: Procedure was tolerated well Level of Consciousness Awake and Alert (Post-procedure): Post Debridement Measurements of Total Wound Length: (cm) 1.2 Width: (cm) 1 Depth: (cm) 0.3 Volume: (cm) 0.283 Character of Wound/Ulcer Post Debridement: Stable Post Procedure Diagnosis Same as Pre-procedure Electronic Signature(s) Signed: 07/05/2018 5:05:37 PM By: Linton Ham MD Signed: 07/05/2018 5:29:19 PM By: Gretta Cool, BSN, RN, CWS, Kim RN, BSN Entered By: Linton Ham on 07/05/2018 15:58:14 Steven Meza ([id number]) -------------------------------------------------------------------------------- HPI Details Patient Name: Steven Meza Date of Service: 07/05/2018 2:30 PM Medical Record Number: [id number] Patient Account Number: [id number] Date of Birth/Sex: [birth year] (85 y.o. M) Treating RN: Cornell Barman Primary Care  Provider: Fulton Reek Other Clinician: Referring Provider: Fulton Reek Treating Provider/Extender: Tito Dine in Treatment: 7 History of Present Illness HPI Description: ADMISSION 05/17/2018 Mr. Fecteau is an 85 year old man who is been treated at the lymphedema clinic for a long period with lymphedema wraps for bilateral lower extremity lymphedema. About 6 months ago they noted small open areas on his left lateral calf just above the ankle. These were open. They are apparently putting some form of ointment on this. They have been referred here for our review of this. The patient has a long history of lymphedema and venous stasis. It says in his records that he has recurrent cellulitis although his wife denies this but she states that the lymphedema may have started with cellulitis several years ago. Also in his record there is a history of bladder cancer which they seem to know very little about however he did not have any radiation to the pelvis or anything that could have contributed to lymphedema that they are aware of. There is no prior wound history. The patient has 2 small punched out areas on the left lateral calf that have adherent debris on the surface. Past medical history; lymphedema, hypertension, cellulitis, hearing loss, morbid obesity, osteoarthritis, venous stasis, bladder CA, diverticulosis. ABIs in our clinic were 0.36 on the right and 0.57 on the left 05/24/2018; corrected age on this patient is 8 versus what I said last week. He has been for his arterial studies which predictably are not very good. On the right his ABI is 0.65. Monophasic waveforms noted at the right ankle including the posterior tibial and dorsalis pedis. He has triphasic waveforms of the common femoral and profunda femoris triphasic proximal SFA with monophasic distal FSA and popliteal artery. Monophasic tibial waveforms. On the left his ABI is 0.58 monophasic waveforms at the left  ankle. Duplex of the left lower extremity demonstrated atherosclerotic change with monophasic waveforms throughout the left lower extremity. There was some concern about left iliac disease and potentially femoral-popliteal and  tibial disease. The patient does not describe claudication although according to his wife he over emphasizes his activity. Patient states he is limited by pain in both knees His wounds are on the left lateral calf. 2 small areas with necrotic debris. We have been using silver collagen and light Ace wraps 05/31/2018; wounds on the left lateral calf in the setting of very significant lymphedema and probably PAD. We have been using silver collagen. The base of the small wound looks reasonably improved. I sent him to see Dr. Fletcher Anon however I see now he has an appointment with Dr. Alvester Chou on February 12 2/5; left lateral calf wound looks the same. His wife is doing a good job with her lymphedema wraps and maintaining that the edema. He most likely has PAD and is due to see Dr. Gwenlyn Found of infectious disease on February 12 2/19; left lateral calf wounds look about the same. This looks like wound secondary to chronic venous insufficiency with lymphedema however the patient has very poor arterial studies. We referred him to Dr. Gwenlyn Found. Dr. Gwenlyn Found did not feel he needed to do anything from an arterial point of view. He did not address the question about the aggressiveness of compression. After some thoughts about this I elected to go ahead and put him in 3 layer compression which after all would be less compression then the lymphedema clinic was putting on this. I am hopeful that this should be enough to get some closure of the small wounds 2/26; left lateral calf wound letter this week. He tolerated the 3 layer compression well furthermore he is sleeping in a hospital bed which helps keep his legs up at night. The wound looks better measuring smaller especially in width 3/3; left lateral  calf wound about the same size. The 3 layer compression has really helped get the edema down in his leg however. Using silver collagen Electronic Signature(s) JERNARD, REIBER ([id number]) Signed: 07/05/2018 5:05:37 PM By: Linton Ham MD Entered By: Linton Ham on 07/05/2018 15:58:47 Steven Meza ([id number]) -------------------------------------------------------------------------------- Physical Exam Details Patient Name: Steven Meza Date of Service: 07/05/2018 2:30 PM Medical Record Number: [id number] Patient Account Number: [id number] Date of Birth/Sex: 09-21-1930 (85 y.o. M) Treating RN: Cornell Barman Primary Care Provider: Fulton Reek Other Clinician: Referring Provider: Fulton Reek Treating Provider/Extender: Tito Dine in Treatment: 7 Constitutional Patient is hypertensive.. Pulse regular and within target range for patient.Marland Kitchen Respirations regular, non-labored and within target range.. Temperature is normal and within the target range for the patient.Marland Kitchen appears in no distress. Notes Wound exam; left lateral calf is down to 1 open area. This has depth and is necrotic in terms of debris. Debrided with a #3 curette of necrotic adherent surface debris. Hemostasis with direct pressure and silver nitrate. No evidence of surrounding infection Electronic Signature(s) Signed: 07/05/2018 5:05:37 PM By: Linton Ham MD Entered By: Linton Ham on 07/05/2018 15:59:52 Steven Meza ([id number]) -------------------------------------------------------------------------------- Physician Orders Details Patient Name: Steven Meza Date of Service: 07/05/2018 2:30 PM Medical Record Number: [id number] Patient Account Number: [id number] Date of Birth/Sex: 1930-09-23 (85 y.o. M) Treating RN: Cornell Barman Primary Care Provider: Fulton Reek Other Clinician: Referring Provider: Fulton Reek Treating Provider/Extender: Tito Dine in Treatment:  7 Verbal / Phone Orders: No Diagnosis Coding Wound Cleansing Wound #1 Left,Lateral Lower Leg o Clean wound with Normal Saline. Anesthetic (add to Medication List) Wound #1 Left,Lateral Lower Leg o Topical Lidocaine 4% cream applied to wound bed prior to debridement (In  Clinic Only). Primary Wound Dressing Wound #1 Left,Lateral Lower Leg o Silver Collagen Secondary Dressing Wound #1 Left,Lateral Lower Leg o ABD pad Dressing Change Frequency Wound #1 Left,Lateral Lower Leg o Change dressing every week o Other: - as needed Follow-up Appointments Wound #1 Left,Lateral Lower Leg o Return Appointment in 1 week. o Nurse Visit as needed - as needed Edema Control Wound #1 Left,Lateral Lower Leg o 3 Layer Compression System - Left Lower Extremity Electronic Signature(s) Signed: 07/05/2018 5:05:37 PM By: Linton Ham MD Signed: 07/05/2018 5:29:19 PM By: Gretta Cool, BSN, RN, CWS, Kim RN, BSN Entered By: Gretta Cool, BSN, RN, CWS, Kim on 07/05/2018 15:17:19 Steven Meza ([id number]) -------------------------------------------------------------------------------- Problem List Details Patient Name: Steven Meza Date of Service: 07/05/2018 2:30 PM Medical Record Number: [id number] Patient Account Number: [id number] Date of Birth/Sex: 30-Sep-1930 (85 y.o. 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 with other specified 05/17/2018 No Yes severity I89.0 Lymphedema, not elsewhere classified 05/17/2018 No Yes I70.212 Atherosclerosis of native arteries of extremities with 05/17/2018 No Yes intermittent claudication, left leg Inactive Problems Resolved Problems Electronic Signature(s) Signed: 07/05/2018  5:05:37 PM By: Linton Ham MD Entered By: Linton Ham on 07/05/2018 15:57:54 Steven Meza ([id number]) -------------------------------------------------------------------------------- Progress Note Details Patient Name: Steven Meza Date of Service: 07/05/2018 2:30 PM Medical Record Number: [id number] Patient Account Number: [id number] Date of Birth/Sex: 11-16-1930 (85 y.o. M) Treating RN: Cornell Barman Primary Care Provider: Fulton Reek Other Clinician: Referring Provider: Fulton Reek Treating Provider/Extender: Tito Dine in Treatment: 7 Subjective History of Present Illness (HPI) ADMISSION 05/17/2018 Mr. Grand is an 85 year old man who is been treated at the lymphedema clinic for a long period with lymphedema wraps for bilateral lower extremity lymphedema. About 6 months ago they noted small open areas on his left lateral calf just above the ankle. These were open. They are apparently putting some form of ointment on this. They have been referred here for our review of this. The patient has a long history of lymphedema and venous stasis. It says in his records that he has recurrent cellulitis although his wife denies this but she states that the lymphedema may have started with cellulitis several years ago. Also in his record there is a history of bladder cancer which they seem to know very little about however he did not have any radiation to the pelvis or anything that could have contributed to lymphedema that they are aware of. There is no prior wound history. The patient has 2 small punched out areas on the left lateral calf that have adherent debris on the surface. Past medical history; lymphedema, hypertension, cellulitis, hearing loss, morbid obesity, osteoarthritis, venous stasis, bladder CA, diverticulosis. ABIs in our clinic were 0.36 on the right and 0.57 on the left 05/24/2018; corrected age on this patient is 31 versus what I said last week.  He has been for his arterial studies which predictably are not very good. On the right his ABI is 0.65. Monophasic waveforms noted at the right ankle including the posterior tibial and dorsalis pedis. He has triphasic waveforms of the common femoral and profunda femoris triphasic proximal SFA with monophasic distal FSA and popliteal artery. Monophasic tibial waveforms. On the left his ABI is 0.58 monophasic waveforms at the left ankle. Duplex of the left lower  extremity demonstrated atherosclerotic change with monophasic waveforms throughout the left lower extremity. There was some concern about left iliac disease and potentially femoral-popliteal and tibial disease. The patient does not describe claudication although according to his wife he over emphasizes his activity. Patient states he is limited by pain in both knees His wounds are on the left lateral calf. 2 small areas with necrotic debris. We have been using silver collagen and light Ace wraps 05/31/2018; wounds on the left lateral calf in the setting of very significant lymphedema and probably PAD. We have been using silver collagen. The base of the small wound looks reasonably improved. I sent him to see Dr. Fletcher Anon however I see now he has an appointment with Dr. Alvester Chou on February 12 2/5; left lateral calf wound looks the same. His wife is doing a good job with her lymphedema wraps and maintaining that the edema. He most likely has PAD and is due to see Dr. Gwenlyn Found of infectious disease on February 12 2/19; left lateral calf wounds look about the same. This looks like wound secondary to chronic venous insufficiency with lymphedema however the patient has very poor arterial studies. We referred him to Dr. Gwenlyn Found. Dr. Gwenlyn Found did not feel he needed to do anything from an arterial point of view. He did not address the question about the aggressiveness of compression. After some thoughts about this I elected to go ahead and put him in 3 layer  compression which after all would be less compression then the lymphedema clinic was putting on this. I am hopeful that this should be enough to get some closure of the small wounds 2/26; left lateral calf wound letter this week. He tolerated the 3 layer compression well furthermore he is sleeping in a hospital bed which helps keep his legs up at night. The wound looks better measuring smaller especially in width 3/3; left lateral calf wound about the same size. The 3 layer compression has really helped get the edema down in his leg however. Using silver collagen Steven, Meza ([id number]) Objective Constitutional Patient is hypertensive.. Pulse regular and within target range for patient.Marland Kitchen Respirations regular, non-labored and within target range.. Temperature is normal and within the target range for the patient.Marland Kitchen appears in no distress. Vitals Time Taken: 2:45 PM, Height: 71 in, Weight: 252 lbs, BMI: 35.1, Temperature: 98.0 F, Pulse: 76 bpm, Respiratory Rate: 18 breaths/min, Blood Pressure: 155/56 mmHg. General Notes: Wound exam; left lateral calf is down to 1 open area. This has depth and is necrotic in terms of debris. Debrided with a #3 curette of necrotic adherent surface debris. Hemostasis with direct pressure and silver nitrate. No evidence of surrounding infection Integumentary (Hair, Skin) Wound #1 status is Open. Original cause of wound was Gradually Appeared. The wound is located on the Left,Lateral Lower Leg. The wound measures 1.2cm length x 1cm width x 0.3cm depth; 0.942cm^2 area and 0.283cm^3 volume. There is Fat Layer (Subcutaneous Tissue) Exposed exposed. There is no undermining noted. There is a medium amount of serous drainage noted. The wound margin is flat and intact. There is small (1-33%) pink granulation within the wound bed. There is a large (67-100%) amount of necrotic tissue within the wound bed including Adherent Slough. Periwound temperature was noted as No  Abnormality. Assessment Active Problems ICD-10 Non-pressure chronic ulcer of left calf with other specified severity Lymphedema, not elsewhere classified Atherosclerosis of native arteries of extremities with intermittent claudication, left leg Procedures Wound #1 Pre-procedure diagnosis of Wound #1 is  a Lymphedema located on the Left,Lateral Lower Leg . There was a Excisional Skin/Subcutaneous Tissue Debridement with a total area of 1.2 sq cm performed by Ricard Dillon, MD. With the following instrument(s): Curette to remove Viable and Non-Viable tissue/material. Material removed includes Subcutaneous Tissue and Slough and after achieving pain control using Lidocaine. No specimens were taken. A time out was conducted at 15:13, prior to the start of the procedure. A Moderate amount of bleeding was controlled with Silver Nitrate. The procedure was tolerated well. Post Debridement Measurements: 1.2cm length x 1cm width x 0.3cm depth; 0.283cm^3 volume. Character of Wound/Ulcer Post Debridement is stable. Post procedure Diagnosis Wound #1: Same as Pre-Procedure Steven, Meza ([id number]) Plan Wound Cleansing: Wound #1 Left,Lateral Lower Leg: Clean wound with Normal Saline. Anesthetic (add to Medication List): Wound #1 Left,Lateral Lower Leg: Topical Lidocaine 4% cream applied to wound bed prior to debridement (In Clinic Only). Primary Wound Dressing: Wound #1 Left,Lateral Lower Leg: Silver Collagen Secondary Dressing: Wound #1 Left,Lateral Lower Leg: ABD pad Dressing Change Frequency: Wound #1 Left,Lateral Lower Leg: Change dressing every week Other: - as needed Follow-up Appointments: Wound #1 Left,Lateral Lower Leg: Return Appointment in 1 week. Nurse Visit as needed - as needed Edema Control: Wound #1 Left,Lateral Lower Leg: 3 Layer Compression System - Left Lower Extremity 1. Continue with silver collagen under 3 layer compression. Edema control is much  better Electronic Signature(s) Signed: 07/05/2018 5:05:37 PM By: Linton Ham MD Entered By: Linton Ham on 07/05/2018 16:00:41 Steven Meza ([id number]) -------------------------------------------------------------------------------- SuperBill Details Patient Name: Steven Meza Date of Service: 07/05/2018 Medical Record Number: [id number] Patient Account Number: [id number] Date of Birth/Sex: Oct 12, 1930 (85 y.o. 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

## 2018-07-09 NOTE — Progress Notes (Signed)
Steven Meza, Steven Meza ([id number]) Visit Report for 07/05/2018 Arrival Information Details Patient Name: Steven Meza, Steven Meza Date of Service: 07/05/2018 2:30 PM Medical Record Number: [id number] Patient Account Number: [id number] Date of Birth/Sex: 28-Sep-1930 (85 y.o. M) Treating RN: Harold Barban Primary Care Shatara Stanek: Fulton Reek Other Clinician: Referring Caressa Scearce: Fulton Reek Treating Makaveli Hoard/Extender: Tito Dine in Treatment: 7 Visit Information History Since Last Visit Added or deleted any medications: No Patient Arrived: Cane Any new allergies or adverse reactions: No Arrival Time: 14:44 Had a fall or experienced change in No Accompanied By: wife activities of daily living that may affect Transfer Assistance: None risk of falls: Patient Identification Verified: Yes Signs or symptoms of abuse/neglect since last visito No Secondary Verification Process Completed: Yes Hospitalized since last visit: No Has Dressing in Place as Prescribed: Yes Has Compression in Place as Prescribed: Yes Pain Present Now: No Electronic Signature(s) Signed: 07/07/2018 3:21:09 PM By: Harold Barban Entered By: Harold Barban on 07/05/2018 14:45:04 Steven Meza ([id number]) -------------------------------------------------------------------------------- Encounter Discharge Information Details Patient Name: Steven Meza Date of Service: 07/05/2018 2:30 PM Medical Record Number: [id number] Patient Account Number: [id number] Date of Birth/Sex: May 02, 1931 (85 y.o. M) Treating RN: Army Melia Primary Care Amman Bartel: Fulton Reek Other Clinician: Referring Lin Hackmann: Fulton Reek Treating Tkeya Stencil/Extender: Tito Dine in Treatment: 7 Encounter Discharge Information Items Post Procedure Vitals Discharge Condition: Stable Temperature (F): 98.0 Ambulatory Status: Cane Pulse (bpm): 76 Discharge Destination: Home Respiratory Rate (breaths/min):  16 Transportation: Private Auto Blood Pressure (mmHg): 155/56 Accompanied By: wife Schedule Follow-up Appointment: Yes Clinical Summary of Care: Electronic Signature(s) Signed: 07/05/2018 4:40:46 PM By: Army Melia Entered By: Army Melia on 07/05/2018 15:37:02 Steven Meza ([id number]) -------------------------------------------------------------------------------- Lower Extremity Assessment Details Patient Name: Steven Meza Date of Service: 07/05/2018 2:30 PM Medical Record Number: [id number] Patient Account Number: [id number] Date of Birth/Sex: 11-02-1930 (85 y.o. M) Treating RN: Harold Barban Primary Care Kyani Simkin: Fulton Reek Other Clinician: Referring Kimberlynn Lumbra: Fulton Reek Treating Escher Harr/Extender: Tito Dine in Treatment: 7 Edema Assessment Assessed: [Left: No] [Right: No] [Left: Edema] [Right: :] Calf Left: Right: Point of Measurement: 35 cm From Medial Instep cm 44.5 cm Ankle Left: Right: Point of Measurement: 12 cm From Medial Instep cm 27.5 cm Vascular Assessment Pulses: Dorsalis Pedis Palpable: [Right:Yes] Posterior Tibial Palpable: [Right:Yes] Extremity colors, hair growth, and conditions: Extremity Color: [Right:Hyperpigmented] Hair Growth on Extremity: [Right:No] Temperature of Extremity: [Right:Warm] Capillary Refill: [Right:< 3 seconds] Toe Nail Assessment Left: Right: Thick: Yes Discolored: Yes Deformed: Yes Improper Length and Hygiene: No Electronic Signature(s) Signed: 07/07/2018 3:21:09 PM By: Harold Barban Entered By: Harold Barban on 07/05/2018 15:01:35 Steven Meza ([id number]) -------------------------------------------------------------------------------- Multi Wound Chart Details Patient Name: Steven Meza Date of Service: 07/05/2018 2:30 PM Medical Record Number: [id number] Patient Account Number: [id number] Date of Birth/Sex: 03-Feb-1931 (85 y.o. M) Treating RN: Cornell Barman Primary Care Doreen Garretson:  Fulton Reek Other Clinician: Referring Boubacar Lerette: Fulton Reek Treating Jaselyn Nahm/Extender: Tito Dine in Treatment: 7 Vital Signs Height(in): 71 Pulse(bpm): 76 Weight(lbs): 252 Blood Pressure(mmHg): 155/56 Body Mass Index(BMI): 35 Temperature(F): 98.0 Respiratory Rate 18 (breaths/min): Photos: [N/A:N/A] Wound Location: Left Lower Leg - Lateral N/A N/A Wounding Event: Gradually Appeared N/A N/A Primary Etiology: Lymphedema N/A N/A Comorbid History: Lymphedema, Coronary Artery N/A N/A Disease, Hypertension Date Acquired: 11/14/2017 N/A N/A Weeks of Treatment: 7 N/A N/A Wound Status: Open N/A N/A Clustered Wound: Yes N/A N/A Measurements L x W x D 1.2x1x0.3 N/A N/A (cm) Area (cm) : 0.942 N/A N/A  Volume (cm) : 0.283 N/A N/A % Reduction in Area: 70.00% N/A N/A % Reduction in Volume: 9.90% N/A N/A Classification: Full Thickness Without N/A N/A Exposed Support Structures Exudate Amount: Medium N/A N/A Exudate Type: Serous N/A N/A Exudate Color: amber N/A N/A Wound Margin: Flat and Intact N/A N/A Granulation Amount: Small (1-33%) N/A N/A Granulation Quality: Pink N/A N/A Necrotic Amount: Large (67-100%) N/A N/A Exposed Structures: Fat Layer (Subcutaneous N/A N/A Tissue) Exposed: Yes Debridement: Debridement - Excisional N/A N/A Pre-procedure 15:13 N/A N/A Verification/Time Out Taken: Steven Meza ([id number]) Pain Control: Lidocaine N/A N/A Tissue Debrided: Subcutaneous, Slough N/A N/A Level: Skin/Subcutaneous Tissue N/A N/A Debridement Area (sq cm): 1.2 N/A N/A Instrument: Curette N/A N/A Bleeding: Moderate N/A N/A Hemostasis Achieved: Silver Nitrate N/A N/A Debridement Treatment Procedure was tolerated well N/A N/A Response: Post Debridement 1.2x1x0.3 N/A N/A Measurements L x W x D (cm) Post Debridement Volume: 0.283 N/A N/A (cm) Periwound Skin Texture: No Abnormalities Noted N/A N/A Periwound Skin Moisture: No Abnormalities Noted  N/A N/A Periwound Skin Color: No Abnormalities Noted N/A N/A Temperature: No Abnormality N/A N/A Tenderness on Palpation: No N/A N/A Wound Preparation: Ulcer Cleansing: N/A N/A Rinsed/Irrigated with Saline Topical Anesthetic Applied: Other: lidocaine 4% Procedures Performed: Debridement N/A N/A Treatment Notes Wound #1 (Left, Lateral Lower Leg) Notes prisma, ABD, 3 layer Electronic Signature(s) Signed: 07/05/2018 5:05:37 PM By: Linton Ham MD Entered By: Linton Ham on 07/05/2018 15:58:04 Steven Meza ([id number]) -------------------------------------------------------------------------------- [place] Details Patient Name: Steven Meza Date of Service: 07/05/2018 2:30 PM Medical Record Number: [id number] Patient Account Number: [id number] Date of Birth/Sex: [birth year] (85 y.o. M) Treating RN: Cornell Barman Primary Care Maycel Riffe: Fulton Reek Other Clinician: Referring Riya Huxford: Fulton Reek Treating Grahm Etsitty/Extender: Tito Dine in Treatment: 7 Active Inactive Orientation to the Wound Care Program Nursing Diagnoses: Knowledge deficit related to the wound healing center program Goals: Patient/caregiver will verbalize understanding of the [facility] Date Initiated: 05/17/2018 Target Resolution Date: 06/17/2018 Goal Status: Active Interventions: Provide education on orientation to the wound center Notes: Venous Leg Ulcer Nursing Diagnoses: Potential for venous Insuffiency (use before diagnosis confirmed) Goals: Patient/caregiver will verbalize understanding of disease process and disease management Date Initiated: 05/17/2018 Target Resolution Date: 06/17/2018 Goal Status: Active Interventions: Assess peripheral edema status every visit. Treatment Activities: Non-invasive vascular studies : 05/17/2018 Notes: Wound/Skin Impairment Nursing Diagnoses: Impaired tissue integrity Goals: Patient/caregiver will  verbalize understanding of skin care regimen Date Initiated: 05/17/2018 Target Resolution Date: 05/17/2018 Goal Status: Active Steven Meza, Steven Meza ([id number]) Ulcer/skin breakdown will heal within 14 weeks Date Initiated: 05/17/2018 Target Resolution Date: 08/16/2018 Goal Status: Active Interventions: Assess ulceration(s) every visit Treatment Activities: Referred to DME Tami Barren for dressing supplies : 05/17/2018 Topical wound management initiated : 05/17/2018 Notes: Electronic Signature(s) Signed: 07/05/2018 5:29:19 PM By: Gretta Cool, BSN, RN, CWS, Kim RN, BSN Entered By: Gretta Cool, BSN, RN, CWS, Kim on 07/05/2018 15:15:11 Steven Meza ([id number]) -------------------------------------------------------------------------------- Pain Assessment Details Patient Name: Steven Meza Date of Service: 07/05/2018 2:30 PM Medical Record Number: [id number] Patient Account Number: [id number] Date of Birth/Sex: 1930/08/05 (85 y.o. M) Treating RN: Harold Barban Primary Care Clark Cuff: Fulton Reek Other Clinician: Referring Topaz Raglin: Fulton Reek Treating Brittanie Dosanjh/Extender: Tito Dine in Treatment: 7 Active Problems Location of Pain Severity and Description of Pain Patient Has Paino No Site Locations Pain Management and Medication Current Pain Management: Electronic Signature(s) Signed: 07/07/2018 3:21:09 PM By: Harold Barban Entered By: Harold Barban on 07/05/2018 14:45:13 Steven Meza ([id number]) --------------------------------------------------------------------------------  Patient/Caregiver Education Details Patient Name: Steven Meza, Steven Meza Date of Service: 07/05/2018 2:30 PM Medical Record Number: [id number] Patient Account Number: [id number] Date of Birth/Gender: October 16, 1930 (85 y.o. M) Treating RN: Cornell Barman Primary Care Physician: Fulton Reek Other Clinician: Referring Physician: Fulton Reek Treating Physician/Extender: Tito Dine in  Treatment: 7 Education Assessment Education Provided To: Patient Education Topics Provided Venous: Handouts: Controlling Swelling with Multilayered Compression Wraps Methods: Demonstration Responses: State content correctly Electronic Signature(s) Signed: 07/05/2018 5:29:19 PM By: Gretta Cool, BSN, RN, CWS, Kim RN, BSN Entered By: Gretta Cool, BSN, RN, CWS, Kim on 07/05/2018 15:18:09 Steven Meza ([id number]) -------------------------------------------------------------------------------- Wound Assessment Details Patient Name: Steven Meza Date of Service: 07/05/2018 2:30 PM Medical Record Number: [id number] Patient Account Number: [id number] Date of Birth/Sex: Oct 11, 1930 (85 y.o. M) Treating RN: Harold Barban Primary Care Farren Nelles: Fulton Reek Other Clinician: Referring Briton Sellman: Fulton Reek Treating Janaiyah Blackard/Extender: Tito Dine in Treatment: 7 Wound Status Wound Number: 1 Primary Lymphedema Etiology: Wound Location: Left Lower Leg - Lateral Wound Status: Open Wounding Event: Gradually Appeared Comorbid Lymphedema, Coronary Artery Disease, Date Acquired: 11/14/2017 History: Hypertension Weeks Of Treatment: 7 Clustered Wound: Yes Photos Wound Measurements Length: (cm) 1.2 Width: (cm) 1 Depth: (cm) 0.3 Area: (cm) 0.942 Volume: (cm) 0.283 % Reduction in Area: 70% % Reduction in Volume: 9.9% Undermining: No Wound Description Full Thickness Without Exposed Support Foul O Classification: Structures Slough Wound Margin: Flat and Intact Exudate Medium Amount: Exudate Type: Serous Exudate Color: amber dor After Cleansing: No /Fibrino Yes Wound Bed Granulation Amount: Small (1-33%) Exposed Structure Granulation Quality: Pink Fat Layer (Subcutaneous Tissue) Exposed: Yes Necrotic Amount: Large (67-100%) Necrotic Quality: Adherent Slough Periwound Skin Texture Texture Color No Abnormalities Noted: No No Abnormalities Noted: No Steven Meza, Steven Meza  ([id number]) Moisture Temperature / Pain No Abnormalities Noted: No Temperature: No Abnormality Wound Preparation Ulcer Cleansing: Rinsed/Irrigated with Saline Topical Anesthetic Applied: Other: lidocaine 4%, Treatment Notes Wound #1 (Left, Lateral Lower Leg) Notes prisma, ABD, 3 layer Electronic Signature(s) Signed: 07/07/2018 3:21:09 PM By: Harold Barban Entered By: Harold Barban on 07/05/2018 15:02:58 Steven Meza ([id number]) -------------------------------------------------------------------------------- Vitals Details Patient Name: Steven Meza Date of Service: 07/05/2018 2:30 PM Medical Record Number: [id number] Patient Account Number: [id number] Date of Birth/Sex: 1930-07-14 (85 y.o. M) Treating RN: Harold Barban Primary Care Carlas Vandyne: Fulton Reek Other Clinician: Referring Rilie Glanz: Fulton Reek Treating Charliene Inoue/Extender: Tito Dine in Treatment: 7 Vital Signs Time Taken: 14:45 Temperature (F): 98.0 Height (in): 71 Pulse (bpm): 76 Weight (lbs): 252 Respiratory Rate (breaths/min): 18 Body Mass Index (BMI): 35.1 Blood Pressure (mmHg): 155/56 Reference Range: 80 - 120 mg / dl Electronic Signature(s) Signed: 07/07/2018 3:21:09 PM By: Harold Barban Entered By: Harold Barban on 07/05/2018 14:45:34

## 2018-07-10 NOTE — Progress Notes (Signed)
CANDELARIO, STEPPE ([id number]) Visit Report for 05/31/2018 Arrival Information Steven Meza Patient Name: Steven Meza, Steven Meza Date of Service: 05/31/2018 12:30 PM Medical Record Number: [id number] Patient Account Number: [id number] Date of Birth/Sex: April 09, 1931 (85 y.o. M) Treating RN: Cornell Barman Primary Care Derotha Fishbaugh: Fulton Reek Other Clinician: Referring Paxtyn Boyar: Fulton Reek Treating Yuri Fana/Extender: Tito Dine in Treatment: 2 Visit Information History Since Last Visit Added or deleted any medications: No Patient Arrived: Cane Any new allergies or adverse reactions: No Arrival Time: 12:37 Had a fall or experienced change in No Accompanied By: wife activities of daily living that may affect Transfer Assistance: None risk of falls: Patient Identification Verified: Yes Signs or symptoms of abuse/neglect since last visito No Secondary Verification Process Completed: Yes Hospitalized since last visit: No Implantable device outside of the clinic excluding No cellular tissue based products placed in the center since last visit: Has Dressing in Place as Prescribed: Yes Pain Present Now: No Electronic Signature(s) Signed: 05/31/2018 3:53:44 PM By: Lorine Bears RCP, RRT, CHT Entered By: Lorine Bears on 05/31/2018 12:38:37 Steven Meza ([id number]) -------------------------------------------------------------------------------- Clinic Level of Care Assessment Steven Meza Patient Name: Steven Meza Date of Service: 05/31/2018 12:30 PM Medical Record Number: [id number] Patient Account Number: [id number] Date of Birth/Sex: [birth year] (85 y.o. M) Treating RN: Cornell Barman Primary Care Quenton Recendez: Fulton Reek Other Clinician: Referring Merik Mignano: Fulton Reek Treating Keziah Avis/Extender: Tito Dine in Treatment: 2 Clinic Level of Care Assessment Items TOOL 4 Quantity Score []  - Use when only an EandM is performed on FOLLOW-UP  visit 0 ASSESSMENTS - Nursing Assessment / Reassessment []  - Reassessment of Co-morbidities (includes updates in patient status) 0 X- 1 5 Reassessment of Adherence to Treatment Plan ASSESSMENTS - Wound and Skin Assessment / Reassessment X - Simple Wound Assessment / Reassessment - one wound 1 5 []  - 0 Complex Wound Assessment / Reassessment - multiple wounds []  - 0 Dermatologic / Skin Assessment (not related to wound area) ASSESSMENTS - Focused Assessment []  - Circumferential Edema Measurements - multi extremities 0 []  - 0 Nutritional Assessment / Counseling / Intervention []  - 0 Lower Extremity Assessment (monofilament, tuning fork, pulses) []  - 0 Peripheral Arterial Disease Assessment (using hand held doppler) ASSESSMENTS - Ostomy and/or Continence Assessment and Care []  - Incontinence Assessment and Management 0 []  - 0 Ostomy Care Assessment and Management (repouching, etc.) PROCESS - Coordination of Care X - Simple Patient / Family Education for ongoing care 1 15 []  - 0 Complex (extensive) Patient / Family Education for ongoing care []  - 0 Staff obtains Programmer, systems, Records, Test Results / Process Orders []  - 0 Staff telephones HHA, Nursing Homes / Clarify orders / etc []  - 0 Routine Transfer to another Facility (non-emergent condition) []  - 0 Routine Hospital Admission (non-emergent condition) []  - 0 New Admissions / Biomedical engineer / Ordering NPWT, Apligraf, etc. []  - 0 Emergency Hospital Admission (emergent condition) X- 1 10 Simple Discharge Coordination Steven Meza, Steven Meza ([id number]) []  - 0 Complex (extensive) Discharge Coordination PROCESS - Special Needs []  - Pediatric / Minor Patient Management 0 []  - 0 Isolation Patient Management []  - 0 Hearing / Language / Visual special needs []  - 0 Assessment of Community assistance (transportation, D/C planning, etc.) []  - 0 Additional assistance / Altered mentation []  - 0 Support Surface(s) Assessment  (bed, cushion, seat, etc.) INTERVENTIONS - Wound Cleansing / Measurement X - Simple Wound Cleansing - one wound 1 5 []  - 0 Complex Wound Cleansing - multiple wounds X-  1 5 Wound Imaging (photographs - any number of wounds) []  - 0 Wound Tracing (instead of photographs) X- 1 5 Simple Wound Measurement - one wound []  - 0 Complex Wound Measurement - multiple wounds INTERVENTIONS - Wound Dressings []  - Small Wound Dressing one or multiple wounds 0 X- 1 15 Medium Wound Dressing one or multiple wounds []  - 0 Large Wound Dressing one or multiple wounds []  - 0 Application of Medications - topical []  - 0 Application of Medications - injection INTERVENTIONS - Miscellaneous []  - External ear exam 0 []  - 0 Specimen Collection (cultures, biopsies, blood, body fluids, etc.) []  - 0 Specimen(s) / Culture(s) sent or taken to Lab for analysis []  - 0 Patient Transfer (multiple staff / Civil Service fast streamer / Similar devices) []  - 0 Simple Staple / Suture removal (25 or less) []  - 0 Complex Staple / Suture removal (26 or more) []  - 0 Hypo / Hyperglycemic Management (close monitor of Blood Glucose) []  - 0 Ankle / Brachial Index (ABI) - do not check if billed separately X- 1 5 Vital Signs Steven Meza, Steven Meza ([id number]) Has the patient been seen at the hospital within the last three years: Yes Total Score: 70 Level Of Care: New/Established - Level 2 Electronic Signature(s) Signed: 05/31/2018 6:00:27 PM By: Gretta Cool, BSN, RN, CWS, Kim RN, BSN Entered By: Gretta Cool, BSN, RN, CWS, Kim on 05/31/2018 13:07:01 Steven Meza ([id number]) -------------------------------------------------------------------------------- Encounter Discharge Information Steven Meza Patient Name: Steven Meza Date of Service: 05/31/2018 12:30 PM Medical Record Number: [id number] Patient Account Number: [id number] Date of Birth/Sex: 12/31/1930 (85 y.o. M) Treating RN: Cornell Barman Primary Care Tawonna Esquer: Fulton Reek Other  Clinician: Referring Brenen Beigel: Fulton Reek Treating Zo Loudon/Extender: Tito Dine in Treatment: 2 Encounter Discharge Information Items Discharge Condition: Stable Ambulatory Status: Ambulatory Discharge Destination: Home Transportation: Private Auto Accompanied By: self Schedule Follow-up Appointment: Yes Clinical Summary of Care: Electronic Signature(s) Signed: 05/31/2018 6:00:27 PM By: Gretta Cool, BSN, RN, CWS, Kim RN, BSN Entered By: Gretta Cool, BSN, RN, CWS, Kim on 05/31/2018 13:11:21 Steven Meza ([id number]) -------------------------------------------------------------------------------- Lower Extremity Assessment Steven Meza Patient Name: Steven Meza Date of Service: 05/31/2018 12:30 PM Medical Record Number: [id number] Patient Account Number: [id number] Date of Birth/Sex: April 07, 1931 (85 y.o. M) Treating RN: Harold Barban Primary Care Breleigh Carpino: Fulton Reek Other Clinician: Referring Samanthia Howland: Fulton Reek Treating Dennice Tindol/Extender: Tito Dine in Treatment: 2 Edema Assessment Assessed: [Left: No] [Right: No] Edema: [Left: Yes] [Right: Yes] Calf Left: Right: Point of Measurement: 36 cm From Medial Instep 47.5 cm 44.5 cm Ankle Left: Right: Point of Measurement: 10 cm From Medial Instep 29 cm 28.5 cm Vascular Assessment Pulses: Dorsalis Pedis Palpable: [Left:No] [Right:No] Posterior Tibial Palpable: [Left:No] [Right:No] Extremity colors, hair growth, and conditions: Extremity Color: [Left:Hyperpigmented] [Right:Hyperpigmented] Hair Growth on Extremity: [Right:No] Temperature of Extremity: [Left:Cool] [Right:Cool] Capillary Refill: [Left:> 3 seconds] [Right:> 3 seconds] Toe Nail Assessment Left: Right: Thick: Yes Yes Discolored: Yes Yes Deformed: Yes Improper Length and Hygiene: No No Electronic Signature(s) Signed: 07/10/2018 10:53:34 AM By: Harold Barban Entered By: Harold Barban on 05/31/2018 12:58:10 Steven Meza  ([id number]) -------------------------------------------------------------------------------- Multi Wound Chart Steven Meza Patient Name: Steven Meza Date of Service: 05/31/2018 12:30 PM Medical Record Number: [id number] Patient Account Number: [id number] Date of Birth/Sex: [birth year] (85 y.o. M) Treating RN: Cornell Barman Primary Care Constance Whittle: Fulton Reek Other Clinician: Referring Peony Barner: Fulton Reek Treating Amatullah Christy/Extender: Tito Dine in Treatment: 2 Vital Signs Height(in): 71 Pulse(bpm): 24 Weight(lbs): 252 Blood Pressure(mmHg): 129/51 Body Mass Index(BMI):  35 Temperature(F): 98.8 Respiratory Rate 16 (breaths/min): Photos: [1:No Photos] [N/A:N/A] Wound Location: [1:Left Lower Leg - Lateral] [N/A:N/A] Wounding Event: [1:Gradually Appeared] [N/A:N/A] Primary Etiology: [1:Lymphedema] [N/A:N/A] Comorbid History: [1:Lymphedema, Coronary Artery N/A Disease, Hypertension] Date Acquired: [1:11/14/2017] [N/A:N/A] Weeks of Treatment: [1:2] [N/A:N/A] Wound Status: [1:Open] [N/A:N/A] Clustered Wound: [1:Yes] [N/A:N/A] Clustered Quantity: [1:2] [N/A:N/A] Measurements L x W x D [1:1.4x1.4x0.3] [N/A:N/A] (cm) Area (cm) : [1:1.539] [N/A:N/A] Volume (cm) : [1:0.462] [N/A:N/A] % Reduction in Area: [1:51.00%] [N/A:N/A] % Reduction in Volume: [1:-47.10%] [N/A:N/A] Classification: [1:Full Thickness Without Exposed Support Structures] [N/A:N/A] Exudate Amount: [1:Medium] [N/A:N/A] Exudate Type: [1:Serous] [N/A:N/A] Exudate Color: [1:amber] [N/A:N/A] Wound Margin: [1:Flat and Intact] [N/A:N/A] Granulation Amount: [1:Small (1-33%)] [N/A:N/A] Granulation Quality: [1:Pink] [N/A:N/A] Necrotic Amount: [1:Large (67-100%)] [N/A:N/A] Exposed Structures: [1:Fat Layer (Subcutaneous Tissue) Exposed: Yes Fascia: No Tendon: No Muscle: No Joint: No Bone: No] [N/A:N/A] Epithelialization: [1:None] [N/A:N/A] Periwound Skin Texture: [1:Excoriation: No Induration: No Callus: No]  [N/A:N/A] Crepitus: No Rash: No Scarring: No Periwound Skin Moisture: Maceration: No N/A N/A Dry/Scaly: No Periwound Skin Color: Hemosiderin Staining: Yes N/A N/A Atrophie Blanche: No Cyanosis: No Ecchymosis: No Erythema: No Mottled: No Pallor: No Rubor: No Temperature: No Abnormality N/A N/A Tenderness on Palpation: Yes N/A N/A Wound Preparation: Ulcer Cleansing: N/A N/A Rinsed/Irrigated with Saline Topical Anesthetic Applied: Other: lidocaine 4% Treatment Notes Wound #1 (Left, Lateral Lower Leg) Notes prisma, ABD, sock Electronic Signature(s) Signed: 06/01/2018 9:49:40 AM By: Linton Ham MD Entered By: Linton Ham on 05/31/2018 13:12:04 Steven Meza ([id number]) -------------------------------------------------------------------------------- Steven Meza Steven Meza Patient Name: Steven Meza Date of Service: 05/31/2018 12:30 PM Medical Record Number: [id number] Patient Account Number: [id number] Date of Birth/Sex: 1931/02/21 (85 y.o. M) Treating RN: Cornell Barman Primary Care Annely Sliva: Fulton Reek Other Clinician: Referring Adonias Demore: Fulton Reek Treating Lenzie Sandler/Extender: Tito Dine in Treatment: 2 Active Inactive Orientation to the Wound Care Program Nursing Diagnoses: Knowledge deficit related to the wound healing center program Goals: Patient/caregiver will verbalize understanding of the [place] Date Initiated: 05/17/2018 Target Resolution Date: 06/17/2018 Goal Status: Active Interventions: Provide education on orientation to the wound center Notes: Venous Leg Ulcer Nursing Diagnoses: Potential for venous Insuffiency (use before diagnosis confirmed) Goals: Patient/caregiver will verbalize understanding of disease process and disease management Date Initiated: 05/17/2018 Target Resolution Date: 06/17/2018 Goal Status: Active Interventions: Assess peripheral edema status every  visit. Treatment Activities: Non-invasive vascular studies : 05/17/2018 Notes: Wound/Skin Impairment Nursing Diagnoses: Impaired tissue integrity Goals: Patient/caregiver will verbalize understanding of skin care regimen Date Initiated: 05/17/2018 Target Resolution Date: 05/17/2018 Goal Status: Active Steven Meza, Steven Meza ([id number]) Ulcer/skin breakdown will heal within 14 weeks Date Initiated: 05/17/2018 Target Resolution Date: 08/16/2018 Goal Status: Active Interventions: Assess ulceration(s) every visit Treatment Activities: Referred to DME Ryliegh Mcduffey for dressing supplies : 05/17/2018 Topical wound management initiated : 05/17/2018 Notes: Electronic Signature(s) Signed: 05/31/2018 6:00:27 PM By: Gretta Cool, BSN, RN, CWS, Kim RN, BSN Entered By: Gretta Cool, BSN, RN, CWS, Kim on 05/31/2018 13:04:12 Steven Meza ([id number]) -------------------------------------------------------------------------------- Pain Assessment Steven Meza Patient Name: Steven Meza Date of Service: 05/31/2018 12:30 PM Medical Record Number: [id number] Patient Account Number: [id number] Date of Birth/Sex: 26-Sep-1930 (85 y.o. M) Treating RN: Cornell Barman Primary Care Taja Pentland: Fulton Reek Other Clinician: Referring Isam Unrein: Fulton Reek Treating Quilla Freeze/Extender: Tito Dine in Treatment: 2 Active Problems Location of Pain Severity and Description of Pain Patient Has Paino No Site Locations Pain Management and Medication Current Pain Management: Electronic Signature(s) Signed: 05/31/2018 3:53:44 PM By: Lorine Bears RCP, RRT,  CHT Signed: 05/31/2018 6:00:27 PM By: Gretta Cool, BSN, RN, CWS, Kim RN, BSN Entered By: Lorine Bears on 05/31/2018 12:38:44 Steven Meza ([id number]) -------------------------------------------------------------------------------- Patient/Caregiver Education Steven Meza Patient Name: Steven Meza Date of Service: 05/31/2018 12:30 PM Medical  Record Number: [id number] Patient Account Number: [id number] Date of Birth/Gender: 26-Mar-1931 (85 y.o. M) Treating RN: Cornell Barman Primary Care Physician: Fulton Reek Other Clinician: Referring Physician: Fulton Reek Treating Physician/Extender: Tito Dine in Treatment: 2 Education Assessment Education Provided To: Patient and Caregiver Education Topics Provided Welcome To [facility]: Wound/Skin Impairment: Handouts: Caring for Your Ulcer Methods: Demonstration, Explain/Verbal Responses: State content correctly Electronic Signature(s) Signed: 05/31/2018 6:00:27 PM By: Gretta Cool, BSN, RN, CWS, Kim RN, BSN Entered By: Gretta Cool, BSN, RN, CWS, Kim on 05/31/2018 13:07:20 Steven Meza ([id number]) -------------------------------------------------------------------------------- Wound Assessment Steven Meza Patient Name: Steven Meza Date of Service: 05/31/2018 12:30 PM Medical Record Number: [id number] Patient Account Number: [id number] Date of Birth/Sex: Nov 04, 1930 (85 y.o. M) Treating RN: Harold Barban Primary Care Dioselina Brumbaugh: Fulton Reek Other Clinician: Referring Eugune Sine: Fulton Reek Treating Jaydrian Corpening/Extender: Tito Dine in Treatment: 2 Wound Status Wound Number: 1 Primary Lymphedema Etiology: Wound Location: Left Lower Leg - Lateral Wound Status: Open Wounding Event: Gradually Appeared Comorbid Lymphedema, Coronary Artery Disease, Date Acquired: 11/14/2017 History: Hypertension Weeks Of Treatment: 2 Clustered Wound: Yes Photos Photo Uploaded By: Harold Barban on 05/31/2018 17:19:15 Wound Measurements Length: (cm) 1.4 Width: (cm) 1.4 Depth: (cm) 0.3 Clustered Quantity: 2 Area: (cm) 1.539 Volume: (cm) 0.462 % Reduction in Area: 51% % Reduction in Volume: -47.1% Epithelialization: None Tunneling: No Undermining: No Wound Description Full Thickness Without Exposed Support Classification: Structures Wound Margin:  Flat and Intact Exudate Medium Amount: Exudate Type: Serous Exudate Color: amber Foul Odor After Cleansing: No Slough/Fibrino Yes Wound Bed Granulation Amount: Small (1-33%) Exposed Structure Granulation Quality: Pink Fascia Exposed: No Necrotic Amount: Large (67-100%) Fat Layer (Subcutaneous Tissue) Exposed: Yes Necrotic Quality: Adherent Slough Tendon Exposed: No Muscle Exposed: No Joint Exposed: No Steven Meza, Steven Meza ([id number]) Bone Exposed: No Periwound Skin Texture Texture Color No Abnormalities Noted: No No Abnormalities Noted: No Callus: No Atrophie Blanche: No Crepitus: No Cyanosis: No Excoriation: No Ecchymosis: No Induration: No Erythema: No Rash: No Hemosiderin Staining: Yes Scarring: No Mottled: No Pallor: No Moisture Rubor: No No Abnormalities Noted: No Dry / Scaly: No Temperature / Pain Maceration: No Temperature: No Abnormality Tenderness on Palpation: Yes Wound Preparation Ulcer Cleansing: Rinsed/Irrigated with Saline Topical Anesthetic Applied: Other: lidocaine 4%, Electronic Signature(s) Signed: 07/10/2018 10:53:34 AM By: Harold Barban Entered By: Harold Barban on 05/31/2018 12:54:51 Steven Meza ([id number]) -------------------------------------------------------------------------------- Vitals Steven Meza Patient Name: Steven Meza Date of Service: 05/31/2018 12:30 PM Medical Record Number: [id number] Patient Account Number: [id number] Date of Birth/Sex: 11-Aug-1930 (85 y.o. M) Treating RN: Cornell Barman Primary Care Thora Scherman: Fulton Reek Other Clinician: Referring Anuar Walgren: Fulton Reek Treating Alexiz Sustaita/Extender: Tito Dine in Treatment: 2 Vital Signs Time Taken: 12:38 Temperature (F): 98.8 Height (in): 71 Pulse (bpm): 63 Weight (lbs): 252 Respiratory Rate (breaths/min): 16 Body Mass Index (BMI): 35.1 Blood Pressure (mmHg): 129/51 Reference Range: 80 - 120 mg / dl Electronic Signature(s) Signed:  05/31/2018 3:53:44 PM By: Lorine Bears RCP, RRT, CHT Entered By: Lorine Bears on 05/31/2018 12:43:00

## 2018-07-12 ENCOUNTER — Other Ambulatory Visit: Payer: Self-pay

## 2018-07-12 ENCOUNTER — Encounter: Payer: Medicare Other | Admitting: Internal Medicine

## 2018-07-12 DIAGNOSIS — I872 Venous insufficiency (chronic) (peripheral): Secondary | ICD-10-CM | POA: Diagnosis not present

## 2018-07-13 NOTE — Progress Notes (Signed)
CYLAS, FALZONE ([id number]) Visit Report for 07/12/2018 HPI Details Patient Name: [name], [name] Date of Service: 07/12/2018 3:15 PM Medical Record Number: [id number] Patient Account Number: [id number] Date of Birth/Sex: 13-May-1930 (85 y.o. M) Treating RN: Cornell Barman Primary Care Provider: Fulton Reek Other Clinician: Referring Provider: Fulton Reek Treating Provider/Extender: Tito Dine in Treatment: 8 History of Present Illness HPI Description: ADMISSION 05/17/2018 Steven Meza is an 85 year old man who is been treated at the lymphedema clinic for a long period with lymphedema wraps for bilateral lower extremity lymphedema. About 6 months ago they noted small open areas on his left lateral calf just above the ankle. These were open. They are apparently putting some form of ointment on this. They have been referred here for our review of this. The patient has a long history of lymphedema and venous stasis. It says in his records that he has recurrent cellulitis although his wife denies this but she states that the lymphedema may have started with cellulitis several years ago. Also in his record there is a history of bladder cancer which they seem to know very little about however he did not have any radiation to the pelvis or anything that could have contributed to lymphedema that they are aware of. There is no prior wound history. The patient has 2 small punched out areas on the left lateral calf that have adherent debris on the surface. Past medical history; lymphedema, hypertension, cellulitis, hearing loss, morbid obesity, osteoarthritis, venous stasis, bladder CA, diverticulosis. ABIs in our clinic were 0.36 on the right and 0.57 on the left 05/24/2018; corrected age on this patient is 85 versus what I said last week. He has been for his arterial studies which predictably are not very good. On the right his ABI is 0.65. Monophasic waveforms noted at the right ankle  including the posterior tibial and dorsalis pedis. He has triphasic waveforms of the common femoral and profunda femoris triphasic proximal SFA with monophasic distal FSA and popliteal artery. Monophasic tibial waveforms. On the left his ABI is 0.58 monophasic waveforms at the left ankle. Duplex of the left lower extremity demonstrated atherosclerotic change with monophasic waveforms throughout the left lower extremity. There was some concern about left iliac disease and potentially femoral-popliteal and tibial disease. The patient does not describe claudication although according to his wife he over emphasizes his activity. Patient states he is limited by pain in both knees His wounds are on the left lateral calf. 2 small areas with necrotic debris. We have been using silver collagen and light Ace wraps 05/31/2018; wounds on the left lateral calf in the setting of very significant lymphedema and probably PAD. We have been using silver collagen. The base of the small wound looks reasonably improved. I sent him to see Dr. Fletcher Anon however I see now he has an appointment with Dr. Alvester Chou on February 12 2/5; left lateral calf wound looks the same. His wife is doing a good job with her lymphedema wraps and maintaining that the edema. He most likely has PAD and is due to see Dr. Gwenlyn Found of infectious disease on February 12 2/19; left lateral calf wounds look about the same. This looks like wound secondary to chronic venous insufficiency with lymphedema however the patient has very poor arterial studies. We referred him to Dr. Gwenlyn Found. Dr. Gwenlyn Found did not feel he needed to do anything from an arterial point of view. He did not address the question about the aggressiveness of compression. After some thoughts about  this I elected to go ahead and put him in 3 layer compression which after all would be less compression then the lymphedema clinic was putting on this. I am hopeful that this should be enough to get  some closure of the small wounds 2/26; left lateral calf wound letter this week. He tolerated the 3 layer compression well furthermore he is sleeping in a hospital bed which helps keep his legs up at night. The wound looks better measuring smaller especially in width 3/3; left lateral calf wound about the same size. The 3 layer compression has really helped get the edema down in his leg Steven Meza, Steven A. ([id number]) however. Using silver collagen 3/11; left lateral calf wound better looking wound surface but about the same size. We have been using 3 layer compression which is really helped get the edema down in the left leg. Even after Dr. Kennon Holter assessment I am reluctant to go to 4 layer compression. He is tolerating 3 layer compression well. We have been using silver collagen Electronic Signature(s) Signed: 07/12/2018 5:42:07 PM By: Linton Ham MD Entered By: Linton Ham on 07/12/2018 16:34:30 Steven Meza ([id number]) -------------------------------------------------------------------------------- Physical Exam Details Patient Name: Steven Meza Date of Service: 07/12/2018 3:15 PM Medical Record Number: [id number] Patient Account Number: [id number] Date of Birth/Sex: 09-21-1930 (85 y.o. M) Treating RN: Cornell Barman Primary Care Provider: Fulton Reek Other Clinician: Referring Provider: Fulton Reek Treating Provider/Extender: Tito Dine in Treatment: 8 Constitutional Patient is hypertensive.. Pulse regular and within target range for patient.Marland Kitchen Respirations regular, non-labored and within target range.. Temperature is normal and within the target range for the patient.Marland Kitchen appears in no distress. Eyes Conjunctivae clear. No discharge. Respiratory Respiratory effort is easy and symmetric bilaterally. Rate is normal at rest and on room air.. Cardiovascular Pedal pulses absent on the left. Integumentary (Hair, Skin) Severe chronic venous insufficiency but  edema control is much better.Marland Kitchen Psychiatric No evidence of depression, anxiety, or agitation. Calm, cooperative, and communicative. Appropriate interactions and affect.. Notes Wound exam; Left lateral calf has 1 open area. This actually looks quite healthy although it still has some depth and really has not changed much in size. There is no surrounding erythema Electronic Signature(s) Signed: 07/12/2018 5:42:07 PM By: Linton Ham MD Entered By: Linton Ham on 07/12/2018 16:37:42 Steven Meza ([id number]) -------------------------------------------------------------------------------- Physician Orders Details Patient Name: Steven Meza Date of Service: 07/12/2018 3:15 PM Medical Record Number: [id number] Patient Account Number: [id number] Date of Birth/Sex: 1931/01/23 (85 y.o. M) Treating RN: Cornell Barman Primary Care Provider: Fulton Reek Other Clinician: Referring Provider: Fulton Reek Treating Provider/Extender: Tito Dine in Treatment: 8 Verbal / Phone Orders: No Diagnosis Coding Wound Cleansing Wound #1 Left,Lateral Lower Leg o Clean wound with Normal Saline. Anesthetic (add to Medication List) Wound #1 Left,Lateral Lower Leg o Topical Lidocaine 4% cream applied to wound bed prior to debridement (In Clinic Only). Primary Wound Dressing Wound #1 Left,Lateral Lower Leg o Silver Collagen Secondary Dressing Wound #1 Left,Lateral Lower Leg o ABD pad Dressing Change Frequency Wound #1 Left,Lateral Lower Leg o Change dressing every week o Other: - as needed Follow-up Appointments Wound #1 Left,Lateral Lower Leg o Return Appointment in 1 week. o Nurse Visit as needed - as needed Edema Control Wound #1 Left,Lateral Lower Leg o 3 Layer Compression System - Left Lower Extremity Electronic Signature(s) Signed: 07/12/2018 5:23:21 PM By: Gretta Cool, BSN, RN, CWS, Kim RN, BSN Signed: 07/12/2018 5:42:07 PM By: Linton Ham MD Entered  By: Gretta Cool,  BSN, RN, CWS, Kim on 07/12/2018 15:54:55 Steven Meza ([id number]) -------------------------------------------------------------------------------- Problem List Details Patient Name: Steven Meza, Steven Meza Date of Service: 07/12/2018 3:15 PM Medical Record Number: [id number] Patient Account Number: [id number] Date of Birth/Sex: Dec 06, 1930 (85 y.o. 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 with other specified 05/17/2018 No Yes severity I89.0 Lymphedema, not elsewhere classified 05/17/2018 No Yes I70.212 Atherosclerosis of native arteries of extremities with 05/17/2018 No Yes intermittent claudication, left leg Inactive Problems Resolved Problems Electronic Signature(s) Signed: 07/12/2018 5:42:07 PM By: Linton Ham MD Entered By: Linton Ham on 07/12/2018 16:33:11 Steven Meza ([id number]) -------------------------------------------------------------------------------- Progress Note Details Patient Name: Steven Meza Date of Service: 07/12/2018 3:15 PM Medical Record Number: [id number] Patient Account Number: [id number] Date of Birth/Sex: 1930/07/01 (85 y.o. M) Treating RN: Cornell Barman Primary Care Provider: Fulton Reek Other Clinician: Referring Provider: Fulton Reek Treating Provider/Extender: Tito Dine in Treatment: 8 Subjective History of Present Illness (HPI) ADMISSION 05/17/2018 Mr. Rosenberg is an 85 year old man who is been treated at the lymphedema clinic for a long period with lymphedema wraps for bilateral lower extremity lymphedema. About 6 months ago they noted small open areas on his left lateral calf just above the ankle. These were open. They are  apparently putting some form of ointment on this. They have been referred here for our review of this. The patient has a long history of lymphedema and venous stasis. It says in his records that he has recurrent cellulitis although his wife denies this but she states that the lymphedema may have started with cellulitis several years ago. Also in his record there is a history of bladder cancer which they seem to know very little about however he did not have any radiation to the pelvis or anything that could have contributed to lymphedema that they are aware of. There is no prior wound history. The patient has 2 small punched out areas on the left lateral calf that have adherent debris on the surface. Past medical history; lymphedema, hypertension, cellulitis, hearing loss, morbid obesity, osteoarthritis, venous stasis, bladder CA, diverticulosis. ABIs in our clinic were 0.36 on the right and 0.57 on the left 05/24/2018; corrected age on this patient is 58 versus what I said last week. He has been for his arterial studies which predictably are not very good. On the right his ABI is 0.65. Monophasic waveforms noted at the right ankle including the posterior tibial and dorsalis pedis. He has triphasic waveforms of the common femoral and profunda femoris triphasic proximal SFA with monophasic distal FSA and popliteal artery. Monophasic tibial waveforms. On the left his ABI is 0.58 monophasic waveforms at the left ankle. Duplex of the left lower extremity demonstrated atherosclerotic change with monophasic waveforms throughout the left lower extremity. There was some concern about left iliac disease and potentially femoral-popliteal and tibial disease. The patient does not describe claudication although according to his wife he over emphasizes his activity. Patient states he is limited by pain in both knees His wounds are on the left lateral calf. 2 small areas with necrotic debris. We have been using  silver collagen and light Ace wraps 05/31/2018; wounds on the left lateral calf in the setting of very significant lymphedema and probably PAD. We have been using silver collagen. The base of the  small wound looks reasonably improved. I sent him to see Dr. Fletcher Anon however I see now he has an appointment with Dr. Alvester Chou on February 12 2/5; left lateral calf wound looks the same. His wife is doing a good job with her lymphedema wraps and maintaining that the edema. He most likely has PAD and is due to see Dr. Gwenlyn Found of infectious disease on February 12 2/19; left lateral calf wounds look about the same. This looks like wound secondary to chronic venous insufficiency with lymphedema however the patient has very poor arterial studies. We referred him to Dr. Gwenlyn Found. Dr. Gwenlyn Found did not feel he needed to do anything from an arterial point of view. He did not address the question about the aggressiveness of compression. After some thoughts about this I elected to go ahead and put him in 3 layer compression which after all would be less compression then the lymphedema clinic was putting on this. I am hopeful that this should be enough to get some closure of the small wounds 2/26; left lateral calf wound letter this week. He tolerated the 3 layer compression well furthermore he is sleeping in a hospital bed which helps keep his legs up at night. The wound looks better measuring smaller especially in width 3/3; left lateral calf wound about the same size. The 3 layer compression has really helped get the edema down in his leg however. Using silver collagen 3/11; left lateral calf wound better looking wound surface but about the same size. We have been using 3 layer compression which is really helped get the edema down in the left leg. Even after Dr. Kennon Holter assessment I am reluctant to go to 4 layer Steven Meza, Steven A. ([id number]) compression. He is tolerating 3 layer compression well. We have been using silver  collagen Objective Constitutional Patient is hypertensive.. Pulse regular and within target range for patient.Marland Kitchen Respirations regular, non-labored and within target range.. Temperature is normal and within the target range for the patient.Marland Kitchen appears in no distress. Vitals Time Taken: 3:35 PM, Height: 71 in, Weight: 252 lbs, BMI: 35.1, Temperature: 98.076 F, Pulse: 76 bpm, Respiratory Rate: 18 breaths/min, Blood Pressure: 148/57 mmHg. Eyes Conjunctivae clear. No discharge. Respiratory Respiratory effort is easy and symmetric bilaterally. Rate is normal at rest and on room air.. Cardiovascular Pedal pulses absent on the left. Psychiatric No evidence of depression, anxiety, or agitation. Calm, cooperative, and communicative. Appropriate interactions and affect.. General Notes: Wound exam; Left lateral calf has 1 open area. This actually looks quite healthy although it still has some depth and really has not changed much in size. There is no surrounding erythema Integumentary (Hair, Skin) Severe chronic venous insufficiency but edema control is much better.. Wound #1 status is Open. Original cause of wound was Gradually Appeared. The wound is located on the Left,Lateral Lower Leg. The wound measures 1.1cm length x 1cm width x 0.3cm depth; 0.864cm^2 area and 0.259cm^3 volume. There is Fat Layer (Subcutaneous Tissue) Exposed exposed. There is no tunneling or undermining noted. There is a medium amount of serous drainage noted. The wound margin is flat and intact. There is small (1-33%) pink granulation within the wound bed. There is a large (67-100%) amount of necrotic tissue within the wound bed including Adherent Slough. Periwound temperature was noted as No Abnormality. The periwound has tenderness on palpation. Assessment Active Problems ICD-10 Non-pressure chronic ulcer of left calf with other specified severity Lymphedema, not elsewhere classified Atherosclerosis of native arteries of  extremities with intermittent claudication, left  leg Steven Meza, Steven Meza ([id number]) Diagnoses ICD-10 L97.228: Non-pressure chronic ulcer of left calf with other specified severity I89.0: Lymphedema, not elsewhere classified I70.212: Atherosclerosis of native arteries of extremities with intermittent claudication, left leg Plan Wound Cleansing: Wound #1 Left,Lateral Lower Leg: Clean wound with Normal Saline. Anesthetic (add to Medication List): Wound #1 Left,Lateral Lower Leg: Topical Lidocaine 4% cream applied to wound bed prior to debridement (In Clinic Only). Primary Wound Dressing: Wound #1 Left,Lateral Lower Leg: Silver Collagen Secondary Dressing: Wound #1 Left,Lateral Lower Leg: ABD pad Dressing Change Frequency: Wound #1 Left,Lateral Lower Leg: Change dressing every week Other: - as needed Follow-up Appointments: Wound #1 Left,Lateral Lower Leg: Return Appointment in 1 week. Nurse Visit as needed - as needed Edema Control: Wound #1 Left,Lateral Lower Leg: 3 Layer Compression System - Left Lower Extremity 1. I am continuing the silver collagen. Endoform might be an alternative 2. Very reluctant to increase the compression to 4 layer although he seems to be tolerating 3 layer fine Electronic Signature(s) Signed: 07/12/2018 5:42:07 PM By: Linton Ham MD Entered By: Linton Ham on 07/12/2018 16:38:37 Steven Meza ([id number]) -------------------------------------------------------------------------------- SuperBill Details Patient Name: Steven Meza Date of Service: 07/12/2018 Medical Record Number: [id number] Patient Account Number: [id number] Date of Birth/Sex: 1931-02-16 (85 y.o. M) Treating RN: Cornell Barman Primary Care Provider: Fulton Reek Other Clinician: Referring Provider: Fulton Reek Treating Provider/Extender: Tito Dine in Treatment: 8 Diagnosis Coding ICD-10 Codes Meza Description [phone] Non-pressure chronic ulcer of  left calf with other specified severity I89.0 Lymphedema, not elsewhere classified I70.212 Atherosclerosis of native arteries of extremities with intermittent claudication, left leg Facility Procedures CPT4 Meza: [id number] Description: (Facility Use Only) [phone] - [facility] LWR LT LEG Modifier: Quantity: 1 Physician Procedures CPT4 Meza: [id number] Description: 10071 - WC PHYS LEVEL 3 - EST PT ICD-10 Diagnosis Description 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

## 2018-07-15 NOTE — Progress Notes (Signed)
KOHNER, ORLICK ([id number]) Visit Report for 07/12/2018 Arrival Information Details Patient Name: Steven Meza, Steven Meza Date of Service: 07/12/2018 3:15 PM Medical Record Number: [id number] Patient Account Number: [id number] Date of Birth/Sex: 1930/07/07 (85 y.o. M) Treating RN: Harold Barban Primary Care Hodari Chuba: Fulton Reek Other Clinician: Referring Evee Liska: Fulton Reek Treating Oaklyn Mans/Extender: Tito Dine in Treatment: 8 Visit Information History Since Last Visit Added or deleted any medications: No Patient Arrived: Cane Any new allergies or adverse reactions: No Arrival Time: 15:33 Had a fall or experienced change in No Accompanied By: wife activities of daily living that may affect Transfer Assistance: None risk of falls: Patient Identification Verified: Yes Signs or symptoms of abuse/neglect since last visito No Secondary Verification Process Completed: Yes Hospitalized since last visit: No Has Dressing in Place as Prescribed: Yes Has Compression in Place as Prescribed: Yes Pain Present Now: No Electronic Signature(s) Signed: 07/14/2018 8:23:32 AM By: Harold Barban Entered By: Harold Barban on 07/12/2018 15:33:39 Steven Meza ([id number]) -------------------------------------------------------------------------------- Encounter Discharge Information Details Patient Name: Steven Meza Date of Service: 07/12/2018 3:15 PM Medical Record Number: [id number] Patient Account Number: [id number] Date of Birth/Sex: 11/03/1930 (85 y.o. M) Treating RN: Army Melia Primary Care Kamela Blansett: Fulton Reek Other Clinician: Referring Jamaira Sherk: Fulton Reek Treating Wildon Cuevas/Extender: Tito Dine in Treatment: 8 Encounter Discharge Information Items Discharge Condition: Stable Ambulatory Status: Cane Discharge Destination: Home Transportation: Private Auto Accompanied By: wife Schedule Follow-up Appointment: Yes Clinical Summary of  Care: Electronic Signature(s) Signed: 07/14/2018 3:17:15 PM By: Army Melia Entered By: Army Melia on 07/12/2018 16:09:59 Steven Meza ([id number]) -------------------------------------------------------------------------------- Lower Extremity Assessment Details Patient Name: Steven Meza Date of Service: 07/12/2018 3:15 PM Medical Record Number: [id number] Patient Account Number: [id number] Date of Birth/Sex: Mar 09, 1931 (85 y.o. M) Treating RN: Harold Barban Primary Care Corin Tilly: Fulton Reek Other Clinician: Referring Rosetta Rupnow: Fulton Reek Treating Alanys Godino/Extender: Tito Dine in Treatment: 8 Edema Assessment Assessed: [Left: No] [Right: No] [Left: Edema] [Right: :] Calf Left: Right: Point of Measurement: 35 cm From Medial Instep 44 cm cm Ankle Left: Right: Point of Measurement: 12 cm From Medial Instep 27.7 cm cm Vascular Assessment Pulses: Dorsalis Pedis Palpable: [Left:Yes] Posterior Tibial Palpable: [Left:Yes] Extremity colors, hair growth, and conditions: Extremity Color: [Left:Red] Hair Growth on Extremity: [Left:No] Temperature of Extremity: [Left:Warm] Toe Nail Assessment Left: Right: Thick: Yes Discolored: Yes Deformed: Yes Improper Length and Hygiene: No Electronic Signature(s) Signed: 07/14/2018 8:23:32 AM By: Harold Barban Entered By: Harold Barban on 07/12/2018 15:48:39 Steven Meza ([id number]) -------------------------------------------------------------------------------- Multi Wound Chart Details Patient Name: Steven Meza Date of Service: 07/12/2018 3:15 PM Medical Record Number: [id number] Patient Account Number: [id number] Date of Birth/Sex: [birth year] (85 y.o. M) Treating RN: Cornell Barman Primary Care Neev Mcmains: Fulton Reek Other Clinician: Referring Nyema Hachey: Fulton Reek Treating Kitt Ledet/Extender: Tito Dine in Treatment: 8 Vital Signs Height(in): 71 Pulse(bpm):  76 Weight(lbs): 252 Blood Pressure(mmHg): 148/57 Body Mass Index(BMI): 35 Temperature(F): 98.076 Respiratory Rate 18 (breaths/min): Photos: [N/A:N/A] Wound Location: Left Lower Leg - Lateral N/A N/A Wounding Event: Gradually Appeared N/A N/A Primary Etiology: Lymphedema N/A N/A Comorbid History: Lymphedema, Coronary Artery N/A N/A Disease, Hypertension Date Acquired: 11/14/2017 N/A N/A Weeks of Treatment: 8 N/A N/A Wound Status: Open N/A N/A Clustered Wound: Yes N/A N/A Measurements L x W x D 1.1x1x0.3 N/A N/A (cm) Area (cm) : 0.864 N/A N/A Volume (cm) : 0.259 N/A N/A % Reduction in Area: 72.50% N/A N/A % Reduction in Volume: 17.50% N/A N/A Classification: Full  Thickness Without N/A N/A Exposed Support Structures Exudate Amount: Medium N/A N/A Exudate Type: Serous N/A N/A Exudate Color: amber N/A N/A Wound Margin: Flat and Intact N/A N/A Granulation Amount: Small (1-33%) N/A N/A Granulation Quality: Pink N/A N/A Necrotic Amount: Large (67-100%) N/A N/A Exposed Structures: Fat Layer (Subcutaneous N/A N/A Tissue) Exposed: Yes Periwound Skin Texture: No Abnormalities Noted N/A N/A Periwound Skin Moisture: No Abnormalities Noted N/A N/A Periwound Skin Color: No Abnormalities Noted N/A N/A IDA, UPPAL ([id number]) Temperature: No Abnormality N/A N/A Tenderness on Palpation: Yes N/A N/A Wound Preparation: Ulcer Cleansing: N/A N/A Rinsed/Irrigated with Saline Topical Anesthetic Applied: Other: lidocaine 4% Treatment Notes Wound #1 (Left, Lateral Lower Leg) Notes prisma, ABD, 3 layer Electronic Signature(s) Signed: 07/12/2018 5:42:07 PM By: Linton Ham MD Entered By: Linton Ham on 07/12/2018 16:33:25 Steven Meza ([id number]) -------------------------------------------------------------------------------- Multi-Disciplinary Care Plan Details Patient Name: Steven Meza Date of Service: 07/12/2018 3:15 PM Medical Record Number:  [id number] Patient Account Number: [id number] Date of Birth/Sex: 1931/04/17 (85 y.o. M) Treating RN: Cornell Barman Primary Care Quitman Norberto: Fulton Reek Other Clinician: Referring Kylani Wires: Fulton Reek Treating Zelig Gacek/Extender: Tito Dine in Treatment: 8 Active Inactive Orientation to the Wound Care Program Nursing Diagnoses: Knowledge deficit related to the wound healing center program Goals: Patient/caregiver will verbalize understanding of the [place] Date Initiated: 05/17/2018 Target Resolution Date: 06/17/2018 Goal Status: Active Interventions: Provide education on orientation to the wound center Notes: Venous Leg Ulcer Nursing Diagnoses: Potential for venous Insuffiency (use before diagnosis confirmed) Goals: Patient/caregiver will verbalize understanding of disease process and disease management Date Initiated: 05/17/2018 Target Resolution Date: 06/17/2018 Goal Status: Active Interventions: Assess peripheral edema status every visit. Treatment Activities: Non-invasive vascular studies : 05/17/2018 Notes: Wound/Skin Impairment Nursing Diagnoses: Impaired tissue integrity Goals: Patient/caregiver will verbalize understanding of skin care regimen Date Initiated: 05/17/2018 Target Resolution Date: 05/17/2018 Goal Status: Active Steven Meza, Steven Meza ([id number]) Ulcer/skin breakdown will heal within 14 weeks Date Initiated: 05/17/2018 Target Resolution Date: 08/16/2018 Goal Status: Active Interventions: Assess ulceration(s) every visit Treatment Activities: Referred to DME Ashwin Tibbs for dressing supplies : 05/17/2018 Topical wound management initiated : 05/17/2018 Notes: Electronic Signature(s) Signed: 07/12/2018 5:23:21 PM By: Gretta Cool, BSN, RN, CWS, Kim RN, BSN Entered By: Gretta Cool, BSN, RN, CWS, Kim on 07/12/2018 15:54:21 Steven Meza ([id number]) -------------------------------------------------------------------------------- Pain  Assessment Details Patient Name: Steven Meza Date of Service: 07/12/2018 3:15 PM Medical Record Number: [id number] Patient Account Number: [id number] Date of Birth/Sex: 1930/08/08 (85 y.o. M) Treating RN: Harold Barban Primary Care Rogena Deupree: Fulton Reek Other Clinician: Referring Clariza Sickman: Fulton Reek Treating Lajuan Kovaleski/Extender: Tito Dine in Treatment: 8 Active Problems Location of Pain Severity and Description of Pain Patient Has Paino No Site Locations Pain Management and Medication Current Pain Management: Electronic Signature(s) Signed: 07/14/2018 8:23:32 AM By: Harold Barban Entered By: Harold Barban on 07/12/2018 15:35:35 Steven Meza ([id number]) -------------------------------------------------------------------------------- Patient/Caregiver Education Details Patient Name: Steven Meza Date of Service: 07/12/2018 3:15 PM Medical Record Number: [id number] Patient Account Number: [id number] Date of Birth/Gender: 1930-11-05 (85 y.o. M) Treating RN: Cornell Barman Primary Care Physician: Fulton Reek Other Clinician: Referring Physician: Fulton Reek Treating Physician/Extender: Tito Dine in Treatment: 8 Education Assessment Education Provided To: Patient and Caregiver Education Topics Provided Venous: Handouts: Controlling Swelling with Multilayered Compression Wraps Methods: Demonstration, Explain/Verbal Responses: State content correctly Electronic Signature(s) Signed: 07/12/2018 5:23:21 PM By: Gretta Cool, BSN, RN, CWS, Kim RN, BSN Entered By: Gretta Cool, BSN, RN, CWS, Kim on 07/12/2018 15:56:05 Steven Meza,  Steven Meza ([id number]) -------------------------------------------------------------------------------- Wound Assessment Details Patient Name: Steven Meza, Steven Meza Date of Service: 07/12/2018 3:15 PM Medical Record Number: [id number] Patient Account Number: [id number] Date of Birth/Sex: 1930/12/09 (85 y.o. M) Treating RN: Harold Barban Primary Care Jazlynn Nemetz: Fulton Reek Other Clinician: Referring Anatasia Tino: Fulton Reek Treating Seeley Hissong/Extender: Tito Dine in Treatment: 8 Wound Status Wound Number: 1 Primary Lymphedema Etiology: Wound Location: Left Lower Leg - Lateral Wound Status: Open Wounding Event: Gradually Appeared Comorbid Lymphedema, Coronary Artery Disease, Date Acquired: 11/14/2017 History: Hypertension Weeks Of Treatment: 8 Clustered Wound: Yes Photos Wound Measurements Length: (cm) 1.1 Width: (cm) 1 Depth: (cm) 0.3 Area: (cm) 0.864 Volume: (cm) 0.259 % Reduction in Area: 72.5% % Reduction in Volume: 17.5% Tunneling: No Undermining: No Wound Description Full Thickness Without Exposed Support Foul O Classification: Structures Slough Wound Margin: Flat and Intact Exudate Medium Amount: Exudate Type: Serous Exudate Color: amber dor After Cleansing: No /Fibrino Yes Wound Bed Granulation Amount: Small (1-33%) Exposed Structure Granulation Quality: Pink Fat Layer (Subcutaneous Tissue) Exposed: Yes Necrotic Amount: Large (67-100%) Necrotic Quality: Adherent Slough Periwound Skin Texture Texture Color No Abnormalities Noted: No No Abnormalities Noted: No Steven Meza, Steven Meza ([id number]) Moisture Temperature / Pain No Abnormalities Noted: No Temperature: No Abnormality Tenderness on Palpation: Yes Wound Preparation Ulcer Cleansing: Rinsed/Irrigated with Saline Topical Anesthetic Applied: Other: lidocaine 4%, Treatment Notes Wound #1 (Left, Lateral Lower Leg) Notes prisma, ABD, 3 layer Electronic Signature(s) Signed: 07/14/2018 8:23:32 AM By: Harold Barban Entered By: Harold Barban on 07/12/2018 15:46:18 Steven Meza ([id number]) -------------------------------------------------------------------------------- Vitals Details Patient Name: Steven Meza Date of Service: 07/12/2018 3:15 PM Medical Record Number: [id number] Patient Account  Number: [id number] Date of Birth/Sex: 01-Oct-1930 (85 y.o. M) Treating RN: Harold Barban Primary Care Shakala Marlatt: Fulton Reek Other Clinician: Referring Raynell Upton: Fulton Reek Treating Deago Burruss/Extender: Tito Dine in Treatment: 8 Vital Signs Time Taken: 15:35 Temperature (F): 98.076 Height (in): 71 Pulse (bpm): 76 Weight (lbs): 252 Respiratory Rate (breaths/min): 18 Body Mass Index (BMI): 35.1 Blood Pressure (mmHg): 148/57 Reference Range: 80 - 120 mg / dl Electronic Signature(s) Signed: 07/14/2018 8:23:32 AM By: Harold Barban Entered By: Harold Barban on 07/12/2018 15:36:12

## 2018-07-19 ENCOUNTER — Other Ambulatory Visit: Payer: Self-pay

## 2018-07-19 ENCOUNTER — Encounter: Payer: Self-pay | Admitting: Podiatry

## 2018-07-19 ENCOUNTER — Encounter: Payer: Medicare Other | Admitting: Internal Medicine

## 2018-07-19 ENCOUNTER — Ambulatory Visit (INDEPENDENT_AMBULATORY_CARE_PROVIDER_SITE_OTHER): Payer: Medicare Other | Admitting: Podiatry

## 2018-07-19 DIAGNOSIS — M79676 Pain in unspecified toe(s): Secondary | ICD-10-CM | POA: Diagnosis not present

## 2018-07-19 DIAGNOSIS — B351 Tinea unguium: Secondary | ICD-10-CM

## 2018-07-19 DIAGNOSIS — I872 Venous insufficiency (chronic) (peripheral): Secondary | ICD-10-CM | POA: Diagnosis not present

## 2018-07-19 NOTE — Progress Notes (Signed)
He presents today chief complaint of painful elongated toenails 1 through 5 bilateral.  Objective: Pulses are palpable.  Dressing to his legs for wound care is intact.  Toenails are thick yellow dystrophic-like mycotic and painful.  Assessment: Pain in limb secondary to onychomycosis.  Plan: Debridement of toenails 1 through 5 bilateral.

## 2018-07-20 NOTE — Progress Notes (Signed)
TU, BAYLE ([id number]) Visit Report for 07/19/2018 Arrival Information Details Patient Name: Steven Meza, Steven Meza Date of Service: 07/19/2018 12:45 PM Medical Record Number: [id number] Patient Account Number: [id number] Date of Birth/Sex: [birth year] (85 y.o. M) Treating RN: Army Melia Primary Care Tala Eber: Fulton Reek Other Clinician: Referring Leveon Pelzer: Fulton Reek Treating Toney Lizaola/Extender: Beverly Gust in Treatment: 9 Visit Information History Since Last Visit Added or deleted any medications: No Patient Arrived: Kasandra Knudsen Any new allergies or adverse reactions: No Arrival Time: 12:55 Had a fall or experienced change in No Accompanied By: wife activities of daily living that may affect Transfer Assistance: None risk of falls: Signs or symptoms of abuse/neglect since last visito No Hospitalized since last visit: No Implantable device outside of the clinic excluding No cellular tissue based products placed in the center since last visit: Has Dressing in Place as Prescribed: Yes Pain Present Now: No Electronic Signature(s) Signed: 07/19/2018 4:50:14 PM By: Army Melia Entered By: Army Melia on 07/19/2018 12:55:59 Steven Meza ([id number]) -------------------------------------------------------------------------------- Encounter Discharge Information Details Patient Name: Steven Meza Date of Service: 07/19/2018 12:45 PM Medical Record Number: [id number] Patient Account Number: [id number] Date of Birth/Sex: 1930-11-25 (85 y.o. M) Treating RN: Army Melia Primary Care Franck Vinal: Fulton Reek Other Clinician: Referring Ylianna Almanzar: Fulton Reek Treating Rosmary Dionisio/Extender: Beverly Gust in Treatment: 9 Encounter Discharge Information Items Post Procedure Vitals Discharge Condition: Stable Temperature (F): 98.3 Ambulatory Status: Cane Pulse (bpm): 67 Discharge Destination: Home Respiratory Rate (breaths/min): 16 Transportation: Private  Auto Blood Pressure (mmHg): 139/60 Accompanied By: wife Schedule Follow-up Appointment: Yes Clinical Summary of Care: Electronic Signature(s) Signed: 07/19/2018 4:50:14 PM By: Army Melia Entered By: Army Melia on 07/19/2018 13:27:54 Steven Meza ([id number]) -------------------------------------------------------------------------------- Lower Extremity Assessment Details Patient Name: Steven Meza Date of Service: 07/19/2018 12:45 PM Medical Record Number: [id number] Patient Account Number: [id number] Date of Birth/Sex: [birth year] (85 y.o. M) Treating RN: Army Melia Primary Care Kymoni Lesperance: Fulton Reek Other Clinician: Referring Parisa Pinela: Fulton Reek Treating Omaya Nieland/Extender: Beverly Gust in Treatment: 9 Edema Assessment Assessed: [Left: No] Patrice Paradise: No] Edema: [Left: N] [Right: o] Vascular Assessment Pulses: Dorsalis Pedis Palpable: [Left:Yes] Posterior Tibial Extremity colors, hair growth, and conditions: Extremity Color: [Left:Hyperpigmented] Hair Growth on Extremity: [Left:No] Temperature of Extremity: [Left:Warm] Capillary Refill: [Left:< 3 seconds] Toe Nail Assessment Left: Right: Thick: Yes Discolored: Yes Deformed: No Improper Length and Hygiene: No Electronic Signature(s) Signed: 07/19/2018 4:50:14 PM By: Army Melia Entered By: Army Melia on 07/19/2018 13:03:23 Steven Meza ([id number]) -------------------------------------------------------------------------------- Multi Wound Chart Details Patient Name: Steven Meza Date of Service: 07/19/2018 12:45 PM Medical Record Number: [id number] Patient Account Number: [id number] Date of Birth/Sex: Sep 28, 1930 (85 y.o. M) Treating RN: Cornell Barman Primary Care Jissel Slavens: Fulton Reek Other Clinician: Referring Vivion Romano: Fulton Reek Treating Timofey Carandang/Extender: Beverly Gust in Treatment: 9 Vital Signs Height(in): 71 Pulse(bpm): 25 Weight(lbs): 252 Blood  Pressure(mmHg): 139/60 Body Mass Index(BMI): 35 Temperature(F): 98.3 Respiratory Rate 16 (breaths/min): Photos: [1:No Photos] [N/A:N/A] Wound Location: [1:Left Lower Leg - Lateral] [N/A:N/A] Wounding Event: [1:Gradually Appeared] [N/A:N/A] Primary Etiology: [1:Lymphedema] [N/A:N/A] Comorbid History: [1:Lymphedema, Coronary Artery N/A Disease, Hypertension] Date Acquired: [1:11/14/2017] [N/A:N/A] Weeks of Treatment: [1:9] [N/A:N/A] Wound Status: [1:Open] [N/A:N/A] Clustered Wound: [1:Yes] [N/A:N/A] Measurements L x W x D [1:1.4x1x0.3] [N/A:N/A] (cm) Area (cm) : [1:1.1] [N/A:N/A] Volume (cm) : [1:0.33] [N/A:N/A] % Reduction in Area: [1:65.00%] [N/A:N/A] % Reduction in Volume: [1:-5.10%] [N/A:N/A] Classification: [1:Full Thickness Without Exposed Support Structures] [N/A:N/A] Exudate Amount: [1:Medium] [N/A:N/A] Exudate Type: [1:Serous] [N/A:N/A] Exudate Color: [1:amber] [N/A:N/A] Wound  Margin: [1:Flat and Intact] [N/A:N/A] Granulation Amount: [1:Small (1-33%)] [N/A:N/A] Granulation Quality: [1:Pink] [N/A:N/A] Necrotic Amount: [1:Large (67-100%)] [N/A:N/A] Necrotic Tissue: [1:Eschar, Adherent Slough] [N/A:N/A] Exposed Structures: [1:Fat Layer (Subcutaneous Tissue) Exposed: Yes] [N/A:N/A] Periwound Skin Texture: [1:No Abnormalities Noted] [N/A:N/A] Periwound Skin Moisture: [1:Dry/Scaly: Yes] [N/A:N/A] Periwound Skin Color: [1:No Abnormalities Noted] [N/A:N/A] Temperature: [1:No Abnormality] [N/A:N/A] Tenderness on Palpation: [1:Yes] [N/A:N/A] Wound Preparation: [1:Ulcer Cleansing: Rinsed/Irrigated with Saline] [N/A:N/A] Topical Anesthetic Applied: Other: lidocaine 4% Treatment Notes Electronic Signature(s) Signed: 07/19/2018 5:54:38 PM By: Gretta Cool, BSN, RN, CWS, Kim RN, BSN Entered By: Gretta Cool, BSN, RN, CWS, Kim on 07/19/2018 13:10:26 Steven Meza ([id number]) -------------------------------------------------------------------------------- Multi-Disciplinary Care Plan  Details Patient Name: Steven Meza Date of Service: 07/19/2018 12:45 PM Medical Record Number: [id number] Patient Account Number: [id number] Date of Birth/Sex: [birth year] (85 y.o. M) Treating RN: Cornell Barman Primary Care Shaquita Fort: Fulton Reek Other Clinician: Referring Archer Vise: Fulton Reek Treating Yuli Lanigan/Extender: Beverly Gust in Treatment: 9 Active Inactive Orientation to the Wound Care Program Nursing Diagnoses: Knowledge deficit related to the wound healing center program Goals: Patient/caregiver will verbalize understanding of the [facility] Date Initiated: 05/17/2018 Target Resolution Date: 06/17/2018 Goal Status: Active Interventions: Provide education on orientation to the wound center Notes: Venous Leg Ulcer Nursing Diagnoses: Potential for venous Insuffiency (use before diagnosis confirmed) Goals: Patient/caregiver will verbalize understanding of disease process and disease management Date Initiated: 05/17/2018 Target Resolution Date: 06/17/2018 Goal Status: Active Interventions: Assess peripheral edema status every visit. Treatment Activities: Non-invasive vascular studies : 05/17/2018 Notes: Wound/Skin Impairment Nursing Diagnoses: Impaired tissue integrity Goals: Patient/caregiver will verbalize understanding of skin care regimen Date Initiated: 05/17/2018 Target Resolution Date: 05/17/2018 Goal Status: Active JEYDAN, BARNER ([id number]) Ulcer/skin breakdown will heal within 14 weeks Date Initiated: 05/17/2018 Target Resolution Date: 08/16/2018 Goal Status: Active Interventions: Assess ulceration(s) every visit Treatment Activities: Referred to DME Jazilyn Siegenthaler for dressing supplies : 05/17/2018 Topical wound management initiated : 05/17/2018 Notes: Electronic Signature(s) Signed: 07/19/2018 5:54:38 PM By: Gretta Cool, BSN, RN, CWS, Kim RN, BSN Entered By: Gretta Cool, BSN, RN, CWS, Kim on 07/19/2018 13:10:21 Steven Meza  ([id number]) -------------------------------------------------------------------------------- Pain Assessment Details Patient Name: Steven Meza Date of Service: 07/19/2018 12:45 PM Medical Record Number: [id number] Patient Account Number: [id number] Date of Birth/Sex: 1931/01/22 (85 y.o. M) Treating RN: Army Melia Primary Care Latayna Ritchie: Fulton Reek Other Clinician: Referring Zniya Cottone: Fulton Reek Treating Jarita Raval/Extender: Beverly Gust in Treatment: 9 Active Problems Location of Pain Severity and Description of Pain Patient Has Paino No Site Locations Pain Management and Medication Current Pain Management: Electronic Signature(s) Signed: 07/19/2018 4:50:14 PM By: Army Melia Entered By: Army Melia on 07/19/2018 12:56:04 Steven Meza ([id number]) -------------------------------------------------------------------------------- Patient/Caregiver Education Details Patient Name: Steven Meza Date of Service: 07/19/2018 12:45 PM Medical Record Number: [id number] Patient Account Number: [id number] Date of Birth/Gender: 05-30-1930 (85 y.o. M) Treating RN: Cornell Barman Primary Care Physician: Fulton Reek Other Clinician: Referring Physician: Fulton Reek Treating Physician/Extender: Beverly Gust in Treatment: 9 Education Assessment Education Provided To: Patient Education Topics Provided Venous: Handouts: Controlling Swelling with Multilayered Compression Wraps Methods: Demonstration, Explain/Verbal Responses: State content correctly Electronic Signature(s) Signed: 07/19/2018 5:54:38 PM By: Gretta Cool, BSN, RN, CWS, Kim RN, BSN Entered By: Gretta Cool, BSN, RN, CWS, Kim on 07/19/2018 13:12:46 Steven Meza ([id number]) -------------------------------------------------------------------------------- Wound Assessment Details Patient Name: Steven Meza Date of Service: 07/19/2018 12:45 PM Medical Record Number: [id number] Patient Account  Number: [id number] Date of Birth/Sex: Sep 02, 1930 (85 y.o. M) Treating RN: Army Melia Primary Care Kishia Shackett: Doy Hutching,  Dellis Filbert Other Clinician: Referring Jaylend Reiland: Fulton Reek Treating Lydia Toren/Extender: Beverly Gust in Treatment: 9 Wound Status Wound Number: 1 Primary Lymphedema Etiology: Wound Location: Left Lower Leg - Lateral Wound Status: Open Wounding Event: Gradually Appeared Comorbid Lymphedema, Coronary Artery Disease, Date Acquired: 11/14/2017 History: Hypertension Weeks Of Treatment: 9 Clustered Wound: Yes Photos Photo Uploaded By: Army Melia on 07/19/2018 16:45:17 Wound Measurements Length: (cm) 1.4 Width: (cm) 1 Depth: (cm) 0.3 Area: (cm) 1.1 Volume: (cm) 0.33 % Reduction in Area: 65% % Reduction in Volume: -5.1% Tunneling: No Undermining: No Wound Description Full Thickness Without Exposed Support Classification: Structures Wound Margin: Flat and Intact Exudate Medium Amount: Exudate Type: Serous Exudate Color: amber Foul Odor After Cleansing: No Slough/Fibrino Yes Wound Bed Granulation Amount: Small (1-33%) Exposed Structure Granulation Quality: Pink Fat Layer (Subcutaneous Tissue) Exposed: Yes Necrotic Amount: Large (67-100%) Necrotic Quality: Eschar, Adherent Slough Periwound Skin Texture Texture Color No Abnormalities Noted: No No Abnormalities Noted: No KENDALE, REMBOLD ([id number]) Moisture Temperature / Pain No Abnormalities Noted: No Temperature: No Abnormality Dry / Scaly: Yes Tenderness on Palpation: Yes Wound Preparation Ulcer Cleansing: Rinsed/Irrigated with Saline Topical Anesthetic Applied: Other: lidocaine 4%, Treatment Notes Wound #1 (Left, Lateral Lower Leg) Notes prisma, ABD, 3 layer Electronic Signature(s) Signed: 07/19/2018 4:50:14 PM By: Army Melia Entered By: Army Melia on 07/19/2018 13:03:04 Steven Meza  ([id number]) -------------------------------------------------------------------------------- Vitals Details Patient Name: Steven Meza Date of Service: 07/19/2018 12:45 PM Medical Record Number: [id number] Patient Account Number: [id number] Date of Birth/Sex: [birth year] (85 y.o. M) Treating RN: Army Melia Primary Care Levenia Skalicky: Fulton Reek Other Clinician: Referring Shellye Zandi: Fulton Reek Treating Mileigh Tilley/Extender: Beverly Gust in Treatment: 9 Vital Signs Time Taken: 12:56 Temperature (F): 98.3 Height (in): 71 Pulse (bpm): 67 Weight (lbs): 252 Respiratory Rate (breaths/min): 16 Body Mass Index (BMI): 35.1 Blood Pressure (mmHg): 139/60 Reference Range: 80 - 120 mg / dl Electronic Signature(s) Signed: 07/19/2018 4:50:14 PM By: Army Melia Entered By: Army Melia on 07/19/2018 12:56:21

## 2018-07-20 NOTE — Progress Notes (Signed)
Steven Meza, Steven Meza ([id number]) Visit Report for 07/19/2018 Debridement Details Patient Name: Steven Meza, Steven Meza Date of Service: 07/19/2018 12:45 PM Medical Record Number: [id number] Patient Account Number: [id number] Date of Birth/Sex: 1931-04-22 (85 y.o. M) Treating RN: Cornell Barman Primary Care Provider: Fulton Reek Other Clinician: Referring Provider: Fulton Reek Treating Provider/Extender: Beverly Gust in Treatment: 9 Debridement Performed for Wound #1 Left,Lateral Lower Leg Assessment: Performed By: Physician Tobi Bastos, MD Debridement Type: Debridement Level of Consciousness (Pre- Awake and Alert procedure): Pre-procedure Verification/Time Yes - 13:10 Out Taken: Start Time: 13:10 Pain Control: Lidocaine Total Area Debrided (L x W): 1.4 (cm) x 0.1 (cm) = 0.14 (cm) Tissue and other material Slough, Biofilm, Slough debrided: Level: Non-Viable Tissue Debridement Description: Selective/Open Wound Instrument: Curette Bleeding: Minimum Hemostasis Achieved: Pressure End Time: 13:11 Response to Treatment: Procedure was tolerated well Level of Consciousness Awake and Alert (Post-procedure): Post Debridement Measurements of Total Wound Length: (cm) 1.4 Width: (cm) 1 Depth: (cm) 0.3 Volume: (cm) 0.33 Character of Wound/Ulcer Post Debridement: Stable Post Procedure Diagnosis Same as Pre-procedure Electronic Signature(s) Signed: 07/19/2018 4:41:49 PM By: Tobi Bastos Signed: 07/19/2018 5:54:38 PM By: Gretta Cool, BSN, RN, CWS, Kim RN, BSN Entered By: Gretta Cool, BSN, RN, CWS, Kim on 07/19/2018 13:12:04 Steven Meza ([id number]) -------------------------------------------------------------------------------- HPI Details Patient Name: Steven Meza Date of Service: 07/19/2018 12:45 PM Medical Record Number: [id number] Patient Account Number: [id number] Date of Birth/Sex: 03-08-1931 (85 y.o. M) Treating RN: Cornell Barman Primary Care Provider: Fulton Reek  Other Clinician: Referring Provider: Fulton Reek Treating Provider/Extender: Beverly Gust in Treatment: 9 History of Present Illness HPI Description: ADMISSION 05/17/2018 Steven Meza is an 85 year old man who is been treated at the lymphedema clinic for a long period with lymphedema wraps for bilateral lower extremity lymphedema. About 6 months ago they noted small open areas on his left lateral calf just above the ankle. These were open. They are apparently putting some form of ointment on this. They have been referred here for our review of this. The patient has a long history of lymphedema and venous stasis. It says in his records that he has recurrent cellulitis although his wife denies this but she states that the lymphedema may have started with cellulitis several years ago. Also in his record there is a history of bladder cancer which they seem to know very little about however he did not have any radiation to the pelvis or anything that could have contributed to lymphedema that they are aware of. There is no prior wound history. The patient has 2 small punched out areas on the left lateral calf that have adherent debris on the surface. Past medical history; lymphedema, hypertension, cellulitis, hearing loss, morbid obesity, osteoarthritis, venous stasis, bladder CA, diverticulosis. ABIs in our clinic were 0.36 on the right and 0.57 on the left 05/24/2018; corrected age on this patient is 41 versus what I said last week. He has been for his arterial studies which predictably are not very good. On the right his ABI is 0.65. Monophasic waveforms noted at the right ankle including the posterior tibial and dorsalis pedis. He has triphasic waveforms of the common femoral and profunda femoris triphasic proximal SFA with monophasic distal FSA and popliteal artery. Monophasic tibial waveforms. On the left his ABI is 0.58 monophasic waveforms at the left ankle. Duplex of the left lower  extremity demonstrated atherosclerotic change with monophasic waveforms throughout the left lower extremity. There was some concern about left iliac disease and potentially femoral-popliteal and tibial disease. The  patient does not describe claudication although according to his wife he over emphasizes his activity. Patient states he is limited by pain in both knees His wounds are on the left lateral calf. 2 small areas with necrotic debris. We have been using silver collagen and light Ace wraps 05/31/2018; wounds on the left lateral calf in the setting of very significant lymphedema and probably PAD. We have been using silver collagen. The base of the small wound looks reasonably improved. I sent him to see Dr. Fletcher Anon however I see now he has an appointment with Dr. Alvester Chou on February 12 2/5; left lateral calf wound looks the same. His wife is doing a good job with her lymphedema wraps and maintaining that the edema. He most likely has PAD and is due to see Dr. Gwenlyn Found of infectious disease on February 12 2/19; left lateral calf wounds look about the same. This looks like wound secondary to chronic venous insufficiency with lymphedema however the patient has very poor arterial studies. We referred him to Dr. Gwenlyn Found. Dr. Gwenlyn Found did not feel he needed to do anything from an arterial point of view. He did not address the question about the aggressiveness of compression. After some thoughts about this I elected to go ahead and put him in 3 layer compression which after all would be less compression then the lymphedema clinic was putting on this. I am hopeful that this should be enough to get some closure of the small wounds 2/26; left lateral calf wound letter this week. He tolerated the 3 layer compression well furthermore he is sleeping in a hospital bed which helps keep his legs up at night. The wound looks better measuring smaller especially in width 3/3; left lateral calf wound about the same size.  The 3 layer compression has really helped get the edema down in his leg however. Using silver collagen 3/11; left lateral calf wound better looking wound surface but about the same size. We have been using 3 layer compression which is really helped get the edema down in the left leg. Even after Dr. Kennon Holter assessment I am reluctant to go to 4 layer compression. He is tolerating 3 layer compression well. We have been using silver collagen Steven Meza, Steven Meza ([id number]) 3/18-Patient returns for his left lateral calf wound which overall is looking better, slightly increased in size and dimensions and area, he is tolerating the 3 layer compression, has been dressed with silver collagen which we will continue Electronic Signature(s) Signed: 07/19/2018 1:14:17 PM By: Tobi Bastos Entered By: Tobi Bastos on 07/19/2018 13:14:17 Steven Meza ([id number]) -------------------------------------------------------------------------------- Physical Exam Details Patient Name: Steven Meza Date of Service: 07/19/2018 12:45 PM Medical Record Number: [id number] Patient Account Number: [id number] Date of Birth/Sex: 04-Feb-1931 (85 y.o. M) Treating RN: Cornell Barman Primary Care Provider: Fulton Reek Other Clinician: Referring Provider: Fulton Reek Treating Provider/Extender: Beverly Gust in Treatment: 9 Constitutional sitting or standing blood pressure is within target range for patient.. Cardiovascular 4+ pitting edema of the bilateral lower extremities. Notes Wound exam-left lateral calf wound has clean margins, healthy looking base, debrided with curette around the edges direct hemostasis to stop bleeding Electronic Signature(s) Signed: 07/19/2018 1:15:15 PM By: Tobi Bastos Entered By: Tobi Bastos on 07/19/2018 13:15:14 Steven Meza ([id number]) -------------------------------------------------------------------------------- Physician Orders Details Patient Name:  Steven Meza Date of Service: 07/19/2018 12:45 PM Medical Record Number: [id number] Patient Account Number: [id number] Date of Birth/Sex: 02/10/1931 (85 y.o. M) Treating RN: Cornell Barman Primary Care Provider: Fulton Reek  Other Clinician: Referring Provider: Fulton Reek Treating Provider/Extender: Beverly Gust in Treatment: 9 Verbal / Phone Orders: No Diagnosis Coding Wound Cleansing Wound #1 Left,Lateral Lower Leg o Clean wound with Normal Saline. Anesthetic (add to Medication List) Wound #1 Left,Lateral Lower Leg o Topical Lidocaine 4% cream applied to wound bed prior to debridement (In Clinic Only). Primary Wound Dressing Wound #1 Left,Lateral Lower Leg o Silver Collagen Secondary Dressing Wound #1 Left,Lateral Lower Leg o ABD pad Dressing Change Frequency Wound #1 Left,Lateral Lower Leg o Change dressing every week o Other: - as needed Follow-up Appointments Wound #1 Left,Lateral Lower Leg o Return Appointment in 1 week. o Nurse Visit as needed - as needed Edema Control Wound #1 Left,Lateral Lower Leg o 3 Layer Compression System - Left Lower Extremity Electronic Signature(s) Signed: 07/19/2018 4:41:49 PM By: Tobi Bastos Signed: 07/19/2018 5:54:38 PM By: Gretta Cool, BSN, RN, CWS, Kim RN, BSN Entered By: Gretta Cool, BSN, RN, CWS, Kim on 07/19/2018 13:12:23 Steven Meza ([id number]) -------------------------------------------------------------------------------- Progress Note Details Patient Name: Steven Meza Date of Service: 07/19/2018 12:45 PM Medical Record Number: [id number] Patient Account Number: [id number] Date of Birth/Sex: 10-Jul-1930 (85 y.o. M) Treating RN: Cornell Barman Primary Care Provider: Fulton Reek Other Clinician: Referring Provider: Fulton Reek Treating Provider/Extender: Beverly Gust in Treatment: 9 Subjective History of Present Illness (HPI) ADMISSION 05/17/2018 Steven Meza is an 85 year old man  who is been treated at the lymphedema clinic for a long period with lymphedema wraps for bilateral lower extremity lymphedema. About 6 months ago they noted small open areas on his left lateral calf just above the ankle. These were open. They are apparently putting some form of ointment on this. They have been referred here for our review of this. The patient has a long history of lymphedema and venous stasis. It says in his records that he has recurrent cellulitis although his wife denies this but she states that the lymphedema may have started with cellulitis several years ago. Also in his record there is a history of bladder cancer which they seem to know very little about however he did not have any radiation to the pelvis or anything that could have contributed to lymphedema that they are aware of. There is no prior wound history. The patient has 2 small punched out areas on the left lateral calf that have adherent debris on the surface. Past medical history; lymphedema, hypertension, cellulitis, hearing loss, morbid obesity, osteoarthritis, venous stasis, bladder CA, diverticulosis. ABIs in our clinic were 0.36 on the right and 0.57 on the left 05/24/2018; corrected age on this patient is 4 versus what I said last week. He has been for his arterial studies which predictably are not very good. On the right his ABI is 0.65. Monophasic waveforms noted at the right ankle including the posterior tibial and dorsalis pedis. He has triphasic waveforms of the common femoral and profunda femoris triphasic proximal SFA with monophasic distal FSA and popliteal artery. Monophasic tibial waveforms. On the left his ABI is 0.58 monophasic waveforms at the left ankle. Duplex of the left lower extremity demonstrated atherosclerotic change with monophasic waveforms throughout the left lower extremity. There was some concern about left iliac disease and potentially femoral-popliteal and tibial disease. The  patient does not describe claudication although according to his wife he over emphasizes his activity. Patient states he is limited by pain in both knees His wounds are on the left lateral calf. 2 small areas with necrotic debris. We have been using silver collagen  and light Ace wraps 05/31/2018; wounds on the left lateral calf in the setting of very significant lymphedema and probably PAD. We have been using silver collagen. The base of the small wound looks reasonably improved. I sent him to see Dr. Fletcher Anon however I see now he has an appointment with Dr. Alvester Chou on February 12 2/5; left lateral calf wound looks the same. His wife is doing a good job with her lymphedema wraps and maintaining that the edema. He most likely has PAD and is due to see Dr. Gwenlyn Found of infectious disease on February 12 2/19; left lateral calf wounds look about the same. This looks like wound secondary to chronic venous insufficiency with lymphedema however the patient has very poor arterial studies. We referred him to Dr. Gwenlyn Found. Dr. Gwenlyn Found did not feel he needed to do anything from an arterial point of view. He did not address the question about the aggressiveness of compression. After some thoughts about this I elected to go ahead and put him in 3 layer compression which after all would be less compression then the lymphedema clinic was putting on this. I am hopeful that this should be enough to get some closure of the small wounds 2/26; left lateral calf wound letter this week. He tolerated the 3 layer compression well furthermore he is sleeping in a hospital bed which helps keep his legs up at night. The wound looks better measuring smaller especially in width 3/3; left lateral calf wound about the same size. The 3 layer compression has really helped get the edema down in his leg however. Using silver collagen 3/11; left lateral calf wound better looking wound surface but about the same size. We have been using 3 layer  compression which is really helped get the edema down in the left leg. Even after Dr. Kennon Holter assessment I am reluctant to go to 4 layer Steven Meza, Steven A. ([id number]) compression. He is tolerating 3 layer compression well. We have been using silver collagen 3/18-Patient returns for his left lateral calf wound which overall is looking better, slightly increased in size and dimensions and area, he is tolerating the 3 layer compression, has been dressed with silver collagen which we will continue Objective Constitutional sitting or standing blood pressure is within target range for patient.. Vitals Time Taken: 12:56 PM, Height: 71 in, Weight: 252 lbs, BMI: 35.1, Temperature: 98.3 F, Pulse: 67 bpm, Respiratory Rate: 16 breaths/min, Blood Pressure: 139/60 mmHg. Cardiovascular 4+ pitting edema of the bilateral lower extremities. General Notes: Wound exam-left lateral calf wound has clean margins, healthy looking base, debrided with curette around the edges direct hemostasis to stop bleeding Integumentary (Hair, Skin) Wound #1 status is Open. Original cause of wound was Gradually Appeared. The wound is located on the Left,Lateral Lower Leg. The wound measures 1.4cm length x 1cm width x 0.3cm depth; 1.1cm^2 area and 0.33cm^3 volume. There is Fat Layer (Subcutaneous Tissue) Exposed exposed. There is no tunneling or undermining noted. There is a medium amount of serous drainage noted. The wound margin is flat and intact. There is small (1-33%) pink granulation within the wound bed. There is a large (67-100%) amount of necrotic tissue within the wound bed including Eschar and Adherent Slough. The periwound skin appearance exhibited: Dry/Scaly. Periwound temperature was noted as No Abnormality. The periwound has tenderness on palpation. Procedures Wound #1 Pre-procedure diagnosis of Wound #1 is a Lymphedema located on the Left,Lateral Lower Leg . There was a Selective/Open Wound Non-Viable Tissue  Debridement with a total area  of 0.14 sq cm performed by Tobi Bastos, MD. With the following instrument(s): Curette Material removed includes Slough and Biofilm and after achieving pain control using Lidocaine. No specimens were taken. A time out was conducted at 13:10, prior to the start of the procedure. A Minimum amount of bleeding was controlled with Pressure. The procedure was tolerated well. Post Debridement Measurements: 1.4cm length x 1cm width x 0.3cm depth; 0.33cm^3 volume. Character of Wound/Ulcer Post Debridement is stable. Post procedure Diagnosis Wound #1: Same as Pre-Procedure Steven Meza, Steven Meza ([id number]) Plan Wound Cleansing: Wound #1 Left,Lateral Lower Leg: Clean wound with Normal Saline. Anesthetic (add to Medication List): Wound #1 Left,Lateral Lower Leg: Topical Lidocaine 4% cream applied to wound bed prior to debridement (In Clinic Only). Primary Wound Dressing: Wound #1 Left,Lateral Lower Leg: Silver Collagen Secondary Dressing: Wound #1 Left,Lateral Lower Leg: ABD pad Dressing Change Frequency: Wound #1 Left,Lateral Lower Leg: Change dressing every week Other: - as needed Follow-up Appointments: Wound #1 Left,Lateral Lower Leg: Return Appointment in 1 week. Nurse Visit as needed - as needed Edema Control: Wound #1 Left,Lateral Lower Leg: 3 Layer Compression System - Left Lower Extremity 1. Continue dressing with silver collagen 2. Continue 3 layer compression wrap 3. Return to clinic in 1 week Electronic Signature(s) Signed: 07/19/2018 1:15:52 PM By: Tobi Bastos Entered By: Tobi Bastos on 07/19/2018 13:15:52 Steven Meza ([id number]) -------------------------------------------------------------------------------- SuperBill Details Patient Name: Steven Meza Date of Service: 07/19/2018 Medical Record Number: [id number] Patient Account Number: [id number] Date of Birth/Sex: 09/21/1930 (85 y.o. M) Treating RN: Cornell Barman Primary Care  Provider: Fulton Reek Other Clinician: Referring Provider: Fulton Reek Treating Provider/Extender: Beverly Gust in Treatment: 9 Diagnosis Coding ICD-10 Codes Meza Description [phone] Non-pressure chronic ulcer of left calf with other specified severity I89.0 Lymphedema, not elsewhere classified I70.212 Atherosclerosis of native arteries of extremities with intermittent claudication, left leg Facility Procedures CPT4 Meza: [id number] Description: 53299 - DEBRIDE WOUND 1ST 20 SQ CM OR < ICD-10 Diagnosis Description L97.228 Non-pressure chronic ulcer of left calf with other specified sev Modifier: erity Quantity: 1 Physician Procedures CPT4 Meza: [id number] Description: 19622 - WC PHYS DEBR WO ANESTH 20 SQ CM ICD-10 Diagnosis Description L97.228 Non-pressure chronic ulcer of left calf with other specified seve Modifier: rity Quantity: 1 Electronic Signature(s) Signed: 07/19/2018 4:41:49 PM By: Tobi Bastos Entered By: Tobi Bastos on 07/19/2018 13:16:07

## 2018-07-24 ENCOUNTER — Encounter: Payer: Self-pay | Admitting: Occupational Therapy

## 2018-07-24 DIAGNOSIS — I890 Lymphedema, not elsewhere classified: Secondary | ICD-10-CM

## 2018-07-24 NOTE — Therapy (Signed)
[place] MAIN [facility] SERVICES [address] [place], [place], [address] Phone: [phone]   Fax:  [phone]  July 24, 2018    _0 @  Occupational Therapy Discharge Summary   Patient: Steven Meza. MRN: [id number] Date of Birth: 19-Aug-1930  Diagnosis: Lymphedema, not elsewhere classified  Referring Provider (OT): Felipa Furnace, MD   The above patient was last seen in Occupational Therapy on 05/16/2018 for lymphedema (LE) treatment to BLE. CDTconsisted of manual lymphatic drainage (MLD), skin care, therapeutic exercise (lymphatic pumping) and compression therapy using multi-layer, short  stretch compression wraps which provide a low resting pressure gradient to facilitate lymphatic return. Pt's spouse assisted him between visits with all aspects of home program. Pt demonstrated a positive response to Complete Decongestive Therapy (CDT) with improvemet in tissue integrity (fibrosis reduction, increased hydration) and  swelling reduction, and demonstrated improved functional ambulation and transfers. Steven Meza was ultimately referred to [facility] wound care clinic to address non-healing wounds and suspected PAD. He has not returned to OT for LE care since last seen.   Pt will demonstrate understanding of lymphedema (LE) precautions / prevention principals, including signs / symptoms of cellulitis infection with modified independence using LE Workbook as printed reference to identify 6 precautions without verbal cues by end of 6th  OT Rx visit.   (Pended)       Baseline  ....................................................  (Pended)     Time  6     Period  Weeks  (Pended)     Status  Achieved         OT LONG TERM GOAL #2   Title  Pt will be able to apply multi-layer short stretch compression wraps using correct gradient techniques with Max CG assistance to achieve optimal limb volume reduction during Intensive Phase CDT, and to  return affected limb(s) , as closely as possible, to premorbid size and shape.  (Pended)     Baseline  Dependent     Time  6     Status  Achieved          OT LONG TERM GOAL #3   Title  0   (Pended)     Time  12  (Pended)     Period  Weeks  (Pended)     Status  Partially Met  -non compliant w/ elevation and there ex.       OT LONG TERM GOAL #4   Title  With moderate  CG assistance Pt will achieve 100% compliance with daily LE self-care home program components, including proper skin care, simple self-MLD, gradient compression wraps/ garments, and therapeutic exercise to ensure optimal Intensive Phase limb volume reduction to expedite compression garment/ device fitting.  (Pended)     Baseline  Max A  (Pended)     Time  12  (Pended)     Period  Weeks  (Pended)     Status  Achieved  (Pended)         OT LONG TERM GOAL #5   Title  Pt will demonstrate competent use of assistive devices during LE self-care training to improve ability to lift legs onto treatment table, to don and doff shoes and socks, and to don and doff compression garments/devices with Max  assistance to ensure optimal LE self-management over time.  (Pended)     Baseline  Dependent  (Pended)     Time  12  (Pended)     Period  Weeks  (Pended)  Status  Achieved  (Pended)         OT LONG TERM GOAL #6   Title  Pt will retain optimal limb volume reductions achieved during Intensive Phase CDT with no more than 3% volume increase with ongoing CG assistance to limit LE progression and further functional decline.  (Pended)     Baseline  Dependent  (Pended)     Time  6  (Pended)     Period  Months  (Pended)     Status  Partially Met  (Pended)         Sincerely,  Andrey Spearman, MS, OTR/L, CLT-LANA CC _0 @  [facility] [address] [place], [place], [address] Phone: [phone]   Fax:  [phone]  Patient: Steven Meza. MRN: [id number] Date of Birth: 02-09-1931

## 2018-07-26 ENCOUNTER — Encounter: Payer: Medicare Other | Admitting: Internal Medicine

## 2018-07-26 ENCOUNTER — Other Ambulatory Visit: Payer: Self-pay

## 2018-07-26 DIAGNOSIS — I872 Venous insufficiency (chronic) (peripheral): Secondary | ICD-10-CM | POA: Diagnosis not present

## 2018-07-27 NOTE — Progress Notes (Signed)
Steven Meza, JOHNDROW ([id number]) Visit Report for 07/26/2018 Debridement Details Patient Name: Steven Meza, Steven Meza Date of Service: 07/26/2018 1:15 PM Medical Record Number: [id number] Patient Account Number: [id number] Date of Birth/Sex: [birth year] (85 y.o. M) Treating RN: Cornell Barman Primary Care Provider: Fulton Reek Other Clinician: Referring Provider: Fulton Reek Treating Provider/Extender: Tito Dine in Treatment: 10 Debridement Performed for Wound #1 Left,Lateral Lower Leg Assessment: Performed By: Physician Ricard Dillon, MD Debridement Type: Debridement Level of Consciousness (Pre- Awake and Alert procedure): Pre-procedure Verification/Time Yes - 14:00 Out Taken: Start Time: 14:00 Pain Control: Lidocaine Total Area Debrided (L x W): 1.2 (cm) x 0.9 (cm) = 1.08 (cm) Tissue and other material Viable, Non-Viable, Slough, Subcutaneous, Slough debrided: Level: Skin/Subcutaneous Tissue Debridement Description: Excisional Instrument: Curette Bleeding: Minimum Hemostasis Achieved: Pressure End Time: 14:04 Response to Treatment: Procedure was tolerated well Level of Consciousness Awake and Alert (Post-procedure): Post Debridement Measurements of Total Wound Length: (cm) 1.2 Width: (cm) 0.9 Depth: (cm) 0.4 Volume: (cm) 0.339 Character of Wound/Ulcer Post Debridement: Stable Post Procedure Diagnosis Same as Pre-procedure Electronic Signature(s) Signed: 07/26/2018 5:18:51 PM By: Linton Ham MD Signed: 07/27/2018 9:14:45 AM By: Gretta Cool, BSN, RN, CWS, Kim RN, BSN Entered By: Linton Ham on 07/26/2018 16:22:39 Steven Meza ([id number]) -------------------------------------------------------------------------------- HPI Details Patient Name: Steven Meza Date of Service: 07/26/2018 1:15 PM Medical Record Number: [id number] Patient Account Number: [id number] Date of Birth/Sex: [birth year] (85 y.o. M) Treating RN: Cornell Barman Primary Care  Provider: Fulton Reek Other Clinician: Referring Provider: Fulton Reek Treating Provider/Extender: Tito Dine in Treatment: 10 History of Present Illness HPI Description: ADMISSION 05/17/2018 Steven Meza is an 85 year old man who is been treated at the lymphedema clinic for a long period with lymphedema wraps for bilateral lower extremity lymphedema. About 6 months ago they noted small open areas on his left lateral calf just above the ankle. These were open. They are apparently putting some form of ointment on this. They have been referred here for our review of this. The patient has a long history of lymphedema and venous stasis. It says in his records that he has recurrent cellulitis although his wife denies this but she states that the lymphedema may have started with cellulitis several years ago. Also in his record there is a history of bladder cancer which they seem to know very little about however he did not have any radiation to the pelvis or anything that could have contributed to lymphedema that they are aware of. There is no prior wound history. The patient has 2 small punched out areas on the left lateral calf that have adherent debris on the surface. Past medical history; lymphedema, hypertension, cellulitis, hearing loss, morbid obesity, osteoarthritis, venous stasis, bladder CA, diverticulosis. ABIs in our clinic were 0.36 on the right and 0.57 on the left 05/24/2018; corrected age on this patient is 37 versus what I said last week. He has been for his arterial studies which predictably are not very good. On the right his ABI is 0.65. Monophasic waveforms noted at the right ankle including the posterior tibial and dorsalis pedis. He has triphasic waveforms of the common femoral and profunda femoris triphasic proximal SFA with monophasic distal FSA and popliteal artery. Monophasic tibial waveforms. On the left his ABI is 0.58 monophasic waveforms at the left  ankle. Duplex of the left lower extremity demonstrated atherosclerotic change with monophasic waveforms throughout the left lower extremity. There was some concern about left iliac disease and potentially femoral-popliteal and tibial  disease. The patient does not describe claudication although according to his wife he over emphasizes his activity. Patient states he is limited by pain in both knees His wounds are on the left lateral calf. 2 small areas with necrotic debris. We have been using silver collagen and light Ace wraps 05/31/2018; wounds on the left lateral calf in the setting of very significant lymphedema and probably PAD. We have been using silver collagen. The base of the small wound looks reasonably improved. I sent him to see Dr. Fletcher Anon however I see now he has an appointment with Dr. Alvester Chou on February 12 2/5; left lateral calf wound looks the same. His wife is doing a good job with her lymphedema wraps and maintaining that the edema. He most likely has PAD and is due to see Dr. Gwenlyn Found of infectious disease on February 12 2/19; left lateral calf wounds look about the same. This looks like wound secondary to chronic venous insufficiency with lymphedema however the patient has very poor arterial studies. We referred him to Dr. Gwenlyn Found. Dr. Gwenlyn Found did not feel he needed to do anything from an arterial point of view. He did not address the question about the aggressiveness of compression. After some thoughts about this I elected to go ahead and put him in 3 layer compression which after all would be less compression then the lymphedema clinic was putting on this. I am hopeful that this should be enough to get some closure of the small wounds 2/26; left lateral calf wound letter this week. He tolerated the 3 layer compression well furthermore he is sleeping in a hospital bed which helps keep his legs up at night. The wound looks better measuring smaller especially in width 3/3; left lateral  calf wound about the same size. The 3 layer compression has really helped get the edema down in his leg however. Using silver collagen 3/11; left lateral calf wound better looking wound surface but about the same size. We have been using 3 layer compression which is really helped get the edema down in the left leg. Even after Dr. Kennon Holter assessment I am reluctant to go to 4 layer compression. He is tolerating 3 layer compression well. We have been using silver collagen GARAN, FRAPPIER ([id number]) 3/18-Patient returns for his left lateral calf wound which overall is looking better, slightly increased in size and dimensions and area, he is tolerating the 3 layer compression, has been dressed with silver collagen which we will continue 3/25oleft lateral calf wound much better looking. Size is smaller. He has been using silver collagen Electronic Signature(s) Signed: 07/26/2018 5:18:51 PM By: Linton Ham MD Entered By: Linton Ham on 07/26/2018 16:24:26 Steven Meza ([id number]) -------------------------------------------------------------------------------- Physical Exam Details Patient Name: Steven Meza Date of Service: 07/26/2018 1:15 PM Medical Record Number: [id number] Patient Account Number: [id number] Date of Birth/Sex: 1930-11-18 (85 y.o. M) Treating RN: Cornell Barman Primary Care Provider: Fulton Reek Other Clinician: Referring Provider: Fulton Reek Treating Provider/Extender: Tito Dine in Treatment: 10 Constitutional Patient is hypertensive.. Pulse regular and within target range for patient.Marland Kitchen Respirations regular, non-labored and within target range.. Temperature is normal and within the target range for the patient.Marland Kitchen appears in no distress. Notes Wound exam; left lateral calf. Debrided with a #5 curette around the edges removing skin and subcutaneous tissue. Hemostasis with direct pressure post debridement the wound actually looks excellent. His  edema control is good Engineer, maintenance) Signed: 07/26/2018 5:18:51 PM By: Linton Ham MD Entered By: Linton Ham  on 07/26/2018 16:25:31 BENJI, POYNTER ([id number]) -------------------------------------------------------------------------------- Physician Orders Details Patient Name: Steven Meza Date of Service: 07/26/2018 1:15 PM Medical Record Number: [id number] Patient Account Number: [id number] Date of Birth/Sex: Sep 12, 1930 (85 y.o. M) Treating RN: Cornell Barman Primary Care Provider: Fulton Reek Other Clinician: Referring Provider: Fulton Reek Treating Provider/Extender: Tito Dine in Treatment: 10 Verbal / Phone Orders: No Diagnosis Coding Wound Cleansing Wound #1 Left,Lateral Lower Leg o Clean wound with Normal Saline. Anesthetic (add to Medication List) Wound #1 Left,Lateral Lower Leg o Topical Lidocaine 4% cream applied to wound bed prior to debridement (In Clinic Only). Primary Wound Dressing Wound #1 Left,Lateral Lower Leg o Silver Collagen Secondary Dressing Wound #1 Left,Lateral Lower Leg o ABD pad Dressing Change Frequency Wound #1 Left,Lateral Lower Leg o Change dressing every week o Other: - as needed Follow-up Appointments Wound #1 Left,Lateral Lower Leg o Return Appointment in 1 week. o Nurse Visit as needed - as needed Edema Control Wound #1 Left,Lateral Lower Leg o 3 Layer Compression System - Left Lower Extremity Electronic Signature(s) Signed: 07/26/2018 5:18:51 PM By: Linton Ham MD Signed: 07/27/2018 9:14:45 AM By: Gretta Cool, BSN, RN, CWS, Kim RN, BSN Entered By: Gretta Cool, BSN, RN, CWS, Kim on 07/26/2018 14:01:55 Steven Meza ([id number]) -------------------------------------------------------------------------------- Problem List Details Patient Name: Steven Meza Date of Service: 07/26/2018 1:15 PM Medical Record Number: [id number] Patient Account Number: [id number] Date of Birth/Sex:  [birth year] (85 y.o. 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 with other specified 05/17/2018 No Yes severity I89.0 Lymphedema, not elsewhere classified 05/17/2018 No Yes I70.212 Atherosclerosis of native arteries of extremities with 05/17/2018 No Yes intermittent claudication, left leg Inactive Problems Resolved Problems Electronic Signature(s) Signed: 07/26/2018 5:18:51 PM By: Linton Ham MD Entered By: Linton Ham on 07/26/2018 16:20:45 Steven Meza ([id number]) -------------------------------------------------------------------------------- Progress Note Details Patient Name: Steven Meza Date of Service: 07/26/2018 1:15 PM Medical Record Number: [id number] Patient Account Number: [id number] Date of Birth/Sex: 1931/04/05 (85 y.o. M) Treating RN: Cornell Barman Primary Care Provider: Fulton Reek Other Clinician: Referring Provider: Fulton Reek Treating Provider/Extender: Tito Dine in Treatment: 10 Subjective History of Present Illness (HPI) ADMISSION 05/17/2018 Mr. Semel is an 85 year old man who is been treated at the lymphedema clinic for a long period with lymphedema wraps for bilateral lower extremity lymphedema. About 6 months ago they noted small open areas on his left lateral calf just above the ankle. These were open. They are apparently putting some form of ointment on this. They have been referred here for our review of this. The patient has a long history of lymphedema and venous stasis. It says in his records that he has recurrent cellulitis although his wife denies this but she states that the lymphedema may have started with cellulitis several years  ago. Also in his record there is a history of bladder cancer which they seem to know very little about however he did not have any radiation to the pelvis or anything that could have contributed to lymphedema that they are aware of. There is no prior wound history. The patient has 2 small punched out areas on the left lateral calf that have adherent debris on the surface. Past medical history; lymphedema, hypertension, cellulitis, hearing loss, morbid obesity, osteoarthritis, venous stasis, bladder CA, diverticulosis.  ABIs in our clinic were 0.36 on the right and 0.57 on the left 05/24/2018; corrected age on this patient is 32 versus what I said last week. He has been for his arterial studies which predictably are not very good. On the right his ABI is 0.65. Monophasic waveforms noted at the right ankle including the posterior tibial and dorsalis pedis. He has triphasic waveforms of the common femoral and profunda femoris triphasic proximal SFA with monophasic distal FSA and popliteal artery. Monophasic tibial waveforms. On the left his ABI is 0.58 monophasic waveforms at the left ankle. Duplex of the left lower extremity demonstrated atherosclerotic change with monophasic waveforms throughout the left lower extremity. There was some concern about left iliac disease and potentially femoral-popliteal and tibial disease. The patient does not describe claudication although according to his wife he over emphasizes his activity. Patient states he is limited by pain in both knees His wounds are on the left lateral calf. 2 small areas with necrotic debris. We have been using silver collagen and light Ace wraps 05/31/2018; wounds on the left lateral calf in the setting of very significant lymphedema and probably PAD. We have been using silver collagen. The base of the small wound looks reasonably improved. I sent him to see Dr. Fletcher Anon however I see now he has an appointment with Dr. Alvester Chou on February  12 2/5; left lateral calf wound looks the same. His wife is doing a good job with her lymphedema wraps and maintaining that the edema. He most likely has PAD and is due to see Dr. Gwenlyn Found of infectious disease on February 12 2/19; left lateral calf wounds look about the same. This looks like wound secondary to chronic venous insufficiency with lymphedema however the patient has very poor arterial studies. We referred him to Dr. Gwenlyn Found. Dr. Gwenlyn Found did not feel he needed to do anything from an arterial point of view. He did not address the question about the aggressiveness of compression. After some thoughts about this I elected to go ahead and put him in 3 layer compression which after all would be less compression then the lymphedema clinic was putting on this. I am hopeful that this should be enough to get some closure of the small wounds 2/26; left lateral calf wound letter this week. He tolerated the 3 layer compression well furthermore he is sleeping in a hospital bed which helps keep his legs up at night. The wound looks better measuring smaller especially in width 3/3; left lateral calf wound about the same size. The 3 layer compression has really helped get the edema down in his leg however. Using silver collagen 3/11; left lateral calf wound better looking wound surface but about the same size. We have been using 3 layer compression which is really helped get the edema down in the left leg. Even after Dr. Kennon Holter assessment I am reluctant to go to 4 layer ADAM, SANJUAN A. ([id number]) compression. He is tolerating 3 layer compression well. We have been using silver collagen 3/18-Patient returns for his left lateral calf wound which overall is looking better, slightly increased in size and dimensions and area, he is tolerating the 3 layer compression, has been dressed with silver collagen which we will continue 3/25 left lateral calf wound much better looking. Size is smaller. He has been using  silver collagen Objective Constitutional Patient is hypertensive.. Pulse regular and within target range for patient.Marland Kitchen Respirations regular, non-labored and within target range.. Temperature is normal and within the target range  for the patient.Marland Kitchen appears in no distress. Vitals Time Taken: 1:42 PM, Height: 71 in, Weight: 252 lbs, BMI: 35.1, Temperature: 97.8 F, Pulse: 79 bpm, Respiratory Rate: 16 breaths/min, Blood Pressure: 159/73 mmHg. General Notes: Wound exam; left lateral calf. Debrided with a #5 curette around the edges removing skin and subcutaneous tissue. Hemostasis with direct pressure post debridement the wound actually looks excellent. His edema control is good Integumentary (Hair, Skin) Wound #1 status is Open. Original cause of wound was Gradually Appeared. The wound is located on the Left,Lateral Lower Leg. The wound measures 1.2cm length x 0.9cm width x 0.3cm depth; 0.848cm^2 area and 0.254cm^3 volume. There is Fat Layer (Subcutaneous Tissue) Exposed exposed. There is no tunneling or undermining noted. There is a medium amount of serous drainage noted. The wound margin is flat and intact. There is small (1-33%) pink granulation within the wound bed. There is a large (67-100%) amount of necrotic tissue within the wound bed including Eschar and Adherent Slough. The periwound skin appearance exhibited: Dry/Scaly. The periwound skin appearance did not exhibit: Callus, Crepitus, Excoriation, Induration, Rash, Scarring, Maceration, Atrophie Blanche, Cyanosis, Ecchymosis, Hemosiderin Staining, Mottled, Pallor, Rubor, Erythema. Periwound temperature was noted as No Abnormality. The periwound has tenderness on palpation. Assessment Active Problems ICD-10 Non-pressure chronic ulcer of left calf with other specified severity Lymphedema, not elsewhere classified Atherosclerosis of native arteries of extremities with intermittent claudication, left leg Procedures Wound #1 Pre-procedure  diagnosis of Wound #1 is a Lymphedema located on the Left,Lateral Lower Leg . There was a Excisional GERROD, MAULE ([id number]) Skin/Subcutaneous Tissue Debridement with a total area of 1.08 sq cm performed by Ricard Dillon, MD. With the following instrument(s): Curette to remove Viable and Non-Viable tissue/material. Material removed includes Subcutaneous Tissue and Slough and after achieving pain control using Lidocaine. A time out was conducted at 14:00, prior to the start of the procedure. A Minimum amount of bleeding was controlled with Pressure. The procedure was tolerated well. Post Debridement Measurements: 1.2cm length x 0.9cm width x 0.4cm depth; 0.339cm^3 volume. Character of Wound/Ulcer Post Debridement is stable. Post procedure Diagnosis Wound #1: Same as Pre-Procedure Plan Wound Cleansing: Wound #1 Left,Lateral Lower Leg: Clean wound with Normal Saline. Anesthetic (add to Medication List): Wound #1 Left,Lateral Lower Leg: Topical Lidocaine 4% cream applied to wound bed prior to debridement (In Clinic Only). Primary Wound Dressing: Wound #1 Left,Lateral Lower Leg: Silver Collagen Secondary Dressing: Wound #1 Left,Lateral Lower Leg: ABD pad Dressing Change Frequency: Wound #1 Left,Lateral Lower Leg: Change dressing every week Other: - as needed Follow-up Appointments: Wound #1 Left,Lateral Lower Leg: Return Appointment in 1 week. Nurse Visit as needed - as needed Edema Control: Wound #1 Left,Lateral Lower Leg: 3 Layer Compression System - Left Lower Extremity 1. Silver collagen to continue 2. Looks as though this is progressing towards closure. His edema control is adequate with 3 layer compression Electronic Signature(s) Signed: 07/26/2018 5:18:51 PM By: Linton Ham MD Entered By: Linton Ham on 07/26/2018 16:30:43 Steven Meza ([id number]) -------------------------------------------------------------------------------- SuperBill Details Patient  Name: Steven Meza Date of Service: 07/26/2018 Medical Record Number: [id number] Patient Account Number: [id number] Date of Birth/Sex: Mar 26, 1931 (85 y.o. 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

## 2018-07-27 NOTE — Progress Notes (Signed)
SADARIUS, NORMAN ([id number]) Visit Report for 07/26/2018 Arrival Information Details Patient Name: Steven Meza, Steven Meza Date of Service: 07/26/2018 1:15 PM Medical Record Number: [id number] Patient Account Number: [id number] Date of Birth/Sex: 26-Mar-1931 (85 y.o. M) Treating RN: Army Melia Primary Care Senaida Chilcote: Fulton Reek Other Clinician: Referring Vernelle Wisner: Fulton Reek Treating Sione Baumgarten/Extender: Tito Dine in Treatment: 10 Visit Information History Since Last Visit Added or deleted any medications: No Patient Arrived: Cane Any new allergies or adverse reactions: No Arrival Time: 13:41 Had a fall or experienced change in No Accompanied By: self activities of daily living that may affect Transfer Assistance: None risk of falls: Signs or symptoms of abuse/neglect since last visito No Hospitalized since last visit: No Implantable device outside of the clinic excluding No cellular tissue based products placed in the center since last visit: Has Dressing in Place as Prescribed: Yes Pain Present Now: No Electronic Signature(s) Signed: 07/26/2018 4:25:44 PM By: Army Melia Entered By: Army Melia on 07/26/2018 13:41:54 Steven Meza ([id number]) -------------------------------------------------------------------------------- Encounter Discharge Information Details Patient Name: Steven Meza Date of Service: 07/26/2018 1:15 PM Medical Record Number: [id number] Patient Account Number: [id number] Date of Birth/Sex: Nov 05, 1930 (85 y.o. M) Treating RN: Army Melia Primary Care Kariss Longmire: Fulton Reek Other Clinician: Referring Karlissa Aron: Fulton Reek Treating Matvey Llanas/Extender: Tito Dine in Treatment: 10 Encounter Discharge Information Items Post Procedure Vitals Discharge Condition: Stable Temperature (F): 97.8 Ambulatory Status: Cane Pulse (bpm): 79 Discharge Destination: Home Respiratory Rate (breaths/min): 16 Transportation:  Private Auto Blood Pressure (mmHg): 159/79 Accompanied By: self Schedule Follow-up Appointment: Yes Clinical Summary of Care: Electronic Signature(s) Signed: 07/26/2018 4:25:44 PM By: Army Melia Entered By: Army Melia on 07/26/2018 14:15:10 Steven Meza ([id number]) -------------------------------------------------------------------------------- Lower Extremity Assessment Details Patient Name: Steven Meza Date of Service: 07/26/2018 1:15 PM Medical Record Number: [id number] Patient Account Number: [id number] Date of Birth/Sex: [birth year] (85 y.o. M) Treating RN: Army Melia Primary Care Yury Schaus: Fulton Reek Other Clinician: Referring Finnick Orosz: Fulton Reek Treating Vernette Moise/Extender: Tito Dine in Treatment: 10 Edema Assessment Assessed: [Left: No] [Right: No] Edema: [Left: N] [Right: o] Vascular Assessment Pulses: Dorsalis Pedis Palpable: [Left:Yes] Extremity colors, hair growth, and conditions: Extremity Color: [Left:Hyperpigmented] Hair Growth on Extremity: [Left:No] Temperature of Extremity: [Left:Warm < 3 seconds] Toe Nail Assessment Left: Right: Thick: Yes Discolored: Yes Deformed: No Improper Length and Hygiene: No Electronic Signature(s) Signed: 07/26/2018 4:25:44 PM By: Army Melia Entered By: Army Melia on 07/26/2018 13:52:46 Steven Meza ([id number]) -------------------------------------------------------------------------------- Multi Wound Chart Details Patient Name: Steven Meza Date of Service: 07/26/2018 1:15 PM Medical Record Number: [id number] Patient Account Number: [id number] Date of Birth/Sex: 03-Jun-1930 (85 y.o. M) Treating RN: Cornell Barman Primary Care Jemeka Wagler: Fulton Reek Other Clinician: Referring Starlina Lapre: Fulton Reek Treating Katryna Tschirhart/Extender: Tito Dine in Treatment: 10 Vital Signs Height(in): 71 Pulse(bpm): 82 Weight(lbs): 252 Blood Pressure(mmHg): 159/73 Body Mass  Index(BMI): 35 Temperature(F): 97.8 Respiratory Rate 16 (breaths/min): Photos: [1:No Photos] [N/A:N/A] Wound Location: [1:Left Lower Leg - Lateral] [N/A:N/A] Wounding Event: [1:Gradually Appeared] [N/A:N/A] Primary Etiology: [1:Lymphedema] [N/A:N/A] Comorbid History: [1:Lymphedema, Coronary Artery N/A Disease, Hypertension] Date Acquired: [1:11/14/2017] [N/A:N/A] Weeks of Treatment: [1:10] [N/A:N/A] Wound Status: [1:Open] [N/A:N/A] Clustered Wound: [1:Yes] [N/A:N/A] Measurements L x W x D [1:1.2x0.9x0.3] [N/A:N/A] (cm) Area (cm) : [1:0.848] [N/A:N/A] Volume (cm) : [1:0.254] [N/A:N/A] % Reduction in Area: [1:73.00%] [N/A:N/A] % Reduction in Volume: [1:19.10%] [N/A:N/A] Classification: [1:Full Thickness Without Exposed Support Structures] [N/A:N/A] Exudate Amount: [1:Medium] [N/A:N/A] Exudate Type: [1:Serous] [N/A:N/A] Exudate Color: [1:amber] [N/A:N/A] Wound  Margin: [1:Flat and Intact] [N/A:N/A] Granulation Amount: [1:Small (1-33%)] [N/A:N/A] Granulation Quality: [1:Pink] [N/A:N/A] Necrotic Amount: [1:Large (67-100%)] [N/A:N/A] Necrotic Tissue: [1:Eschar, Adherent Slough] [N/A:N/A] Exposed Structures: [1:Fat Layer (Subcutaneous Tissue) Exposed: Yes] [N/A:N/A] Debridement: [1:Debridement - Excisional] [N/A:N/A] Pre-procedure [1:14:00] [N/A:N/A] Verification/Time Out Taken: Pain Control: [1:Lidocaine] [N/A:N/A] Tissue Debrided: [1:Subcutaneous, Slough] [N/A:N/A] Level: [1:Skin/Subcutaneous Tissue] [N/A:N/A] Debridement Area (sq cm): [1:1.08] [N/A:N/A] Instrument: [1:Curette] [N/A:N/A] Bleeding: [1:Minimum] [N/A:N/A] Hemostasis Achieved: Pressure N/A N/A Debridement Treatment Procedure was tolerated well N/A N/A Response: Post Debridement 1.2x0.9x0.4 N/A N/A Measurements L x W x D (cm) Post Debridement Volume: 0.339 N/A N/A (cm) Periwound Skin Texture: Excoriation: No N/A N/A Induration: No Callus: No Crepitus: No Rash: No Scarring: No Periwound Skin  Moisture: Dry/Scaly: Yes N/A N/A Maceration: No Periwound Skin Color: Atrophie Blanche: No N/A N/A Cyanosis: No Ecchymosis: No Erythema: No Hemosiderin Staining: No Mottled: No Pallor: No Rubor: No Temperature: No Abnormality N/A N/A Tenderness on Palpation: Yes N/A N/A Wound Preparation: Ulcer Cleansing: N/A N/A Rinsed/Irrigated with Saline Topical Anesthetic Applied: Other: lidocaine 4% Procedures Performed: Debridement N/A N/A Treatment Notes Wound #1 (Left, Lateral Lower Leg) Notes prisma, ABD, 3 layer Electronic Signature(s) Signed: 07/26/2018 5:18:51 PM By: Linton Ham MD Entered By: Linton Ham on 07/26/2018 16:20:51 Steven Meza ([id number]) -------------------------------------------------------------------------------- [place] Details Patient Name: Steven Meza Date of Service: 07/26/2018 1:15 PM Medical Record Number: [id number] Patient Account Number: [id number] Date of Birth/Sex: 1930/10/07 (85 y.o. M) Treating RN: Cornell Barman Primary Care Harun Brumley: Fulton Reek Other Clinician: Referring Guadalupe Kerekes: Fulton Reek Treating Cheyenne Bordeaux/Extender: Tito Dine in Treatment: 10 Active Inactive Orientation to the Wound Care Program Nursing Diagnoses: Knowledge deficit related to the wound healing center program Goals: Patient/caregiver will verbalize understanding of the [place] Date Initiated: 05/17/2018 Target Resolution Date: 06/17/2018 Goal Status: Active Interventions: Provide education on orientation to the wound center Notes: Venous Leg Ulcer Nursing Diagnoses: Potential for venous Insuffiency (use before diagnosis confirmed) Goals: Patient/caregiver will verbalize understanding of disease process and disease management Date Initiated: 05/17/2018 Target Resolution Date: 06/17/2018 Goal Status: Active Interventions: Assess peripheral edema status every visit. Treatment  Activities: Non-invasive vascular studies : 05/17/2018 Notes: Wound/Skin Impairment Nursing Diagnoses: Impaired tissue integrity Goals: Patient/caregiver will verbalize understanding of skin care regimen Date Initiated: 05/17/2018 Target Resolution Date: 05/17/2018 Goal Status: Active Steven Meza, Steven Meza ([id number]) Ulcer/skin breakdown will heal within 14 weeks Date Initiated: 05/17/2018 Target Resolution Date: 08/16/2018 Goal Status: Active Interventions: Assess ulceration(s) every visit Treatment Activities: Referred to DME Rigoberto Repass for dressing supplies : 05/17/2018 Topical wound management initiated : 05/17/2018 Notes: Electronic Signature(s) Signed: 07/27/2018 9:14:45 AM By: Gretta Cool, BSN, RN, CWS, Kim RN, BSN Entered By: Gretta Cool, BSN, RN, CWS, Kim on 07/26/2018 13:59:22 Steven Meza ([id number]) -------------------------------------------------------------------------------- Pain Assessment Details Patient Name: Steven Meza Date of Service: 07/26/2018 1:15 PM Medical Record Number: [id number] Patient Account Number: [id number] Date of Birth/Sex: [birth year] (85 y.o. M) Treating RN: Army Melia Primary Care Alonza Knisley: Fulton Reek Other Clinician: Referring Azilee Pirro: Fulton Reek Treating Novella Abraha/Extender: Tito Dine in Treatment: 10 Active Problems Location of Pain Severity and Description of Pain Patient Has Paino No Site Locations Pain Management and Medication Current Pain Management: Electronic Signature(s) Signed: 07/26/2018 4:25:44 PM By: Army Melia Entered By: Army Melia on 07/26/2018 13:42:00 Steven Meza ([id number]) -------------------------------------------------------------------------------- Patient/Caregiver Education Details Patient Name: Steven Meza Date of Service: 07/26/2018 1:15 PM Medical Record Number: [id number] Patient Account Number: [id number] Date of Birth/Gender: 1930/08/28 (86 y.o. M) Treating RN:  Cornell Barman Primary Care Physician: Fulton Reek Other Clinician: Referring Physician: Fulton Reek Treating Physician/Extender: Tito Dine in Treatment: 10 Education Assessment Education Provided To: Patient Education Topics Provided Wound/Skin Impairment: Handouts: Caring for Your Ulcer Methods: Demonstration, Explain/Verbal Responses: State content correctly Electronic Signature(s) Signed: 07/27/2018 9:14:45 AM By: Gretta Cool, BSN, RN, CWS, Kim RN, BSN Entered By: Gretta Cool, BSN, RN, CWS, Kim on 07/26/2018 14:03:19 Steven Meza ([id number]) -------------------------------------------------------------------------------- Wound Assessment Details Patient Name: Steven Meza Date of Service: 07/26/2018 1:15 PM Medical Record Number: [id number] Patient Account Number: [id number] Date of Birth/Sex: [birth year] (85 y.o. M) Treating RN: Army Melia Primary Care Haruto Demaria: Fulton Reek Other Clinician: Referring Jenefer Woerner: Fulton Reek Treating Ji Feldner/Extender: Tito Dine in Treatment: 10 Wound Status Wound Number: 1 Primary Lymphedema Etiology: Wound Location: Left Lower Leg - Lateral Wound Status: Open Wounding Event: Gradually Appeared Comorbid Lymphedema, Coronary Artery Disease, Date Acquired: 11/14/2017 History: Hypertension Weeks Of Treatment: 10 Clustered Wound: Yes Wound Measurements Length: (cm) 1.2 Width: (cm) 0.9 Depth: (cm) 0.3 Area: (cm) 0.848 Volume: (cm) 0.254 % Reduction in Area: 73% % Reduction in Volume: 19.1% Tunneling: No Undermining: No Wound Description Full Thickness Without Exposed Support Classification: Structures Wound Margin: Flat and Intact Exudate Medium Amount: Exudate Type: Serous Exudate Color: amber Foul Odor After Cleansing: No Slough/Fibrino Yes Wound Bed Granulation Amount: Small (1-33%) Exposed Structure Granulation Quality: Pink Fat Layer (Subcutaneous Tissue) Exposed: Yes Necrotic Amount:  Large (67-100%) Necrotic Quality: Eschar, Adherent Slough Periwound Skin Texture Texture Color No Abnormalities Noted: No No Abnormalities Noted: No Callus: No Atrophie Blanche: No Crepitus: No Cyanosis: No Excoriation: No Ecchymosis: No Induration: No Erythema: No Rash: No Hemosiderin Staining: No Scarring: No Mottled: No Pallor: No Moisture Rubor: No No Abnormalities Noted: No Dry / Scaly: Yes Temperature / Pain Maceration: No Temperature: No Abnormality Tenderness on Palpation: Yes Steven Meza, Steven Meza ([id number]) Wound Preparation Ulcer Cleansing: Rinsed/Irrigated with Saline Topical Anesthetic Applied: Other: lidocaine 4%, Treatment Notes Wound #1 (Left, Lateral Lower Leg) Notes prisma, ABD, 3 layer Electronic Signature(s) Signed: 07/26/2018 4:25:44 PM By: Army Melia Entered By: Army Melia on 07/26/2018 13:52:27 Steven Meza ([id number]) -------------------------------------------------------------------------------- Vitals Details Patient Name: Steven Meza Date of Service: 07/26/2018 1:15 PM Medical Record Number: [id number] Patient Account Number: [id number] Date of Birth/Sex: 1930/05/13 (85 y.o. M) Treating RN: Army Melia Primary Care Quaniya Damas: Fulton Reek Other Clinician: Referring Dimitrious Micciche: Fulton Reek Treating Zayaan Kozak/Extender: Tito Dine in Treatment: 10 Vital Signs Time Taken: 13:42 Temperature (F): 97.8 Height (in): 71 Pulse (bpm): 79 Weight (lbs): 252 Respiratory Rate (breaths/min): 16 Body Mass Index (BMI): 35.1 Blood Pressure (mmHg): 159/73 Reference Range: 80 - 120 mg / dl Electronic Signature(s) Signed: 07/26/2018 4:25:44 PM By: Army Melia Entered By: Army Melia on 07/26/2018 13:44:16

## 2018-08-02 ENCOUNTER — Other Ambulatory Visit: Payer: Self-pay

## 2018-08-02 ENCOUNTER — Encounter: Payer: Medicare Other | Attending: Internal Medicine | Admitting: Internal Medicine

## 2018-08-02 DIAGNOSIS — L97228 Non-pressure chronic ulcer of left calf with other specified severity: Secondary | ICD-10-CM | POA: Diagnosis not present

## 2018-08-02 DIAGNOSIS — Z6835 Body mass index (BMI) 35.0-35.9, adult: Secondary | ICD-10-CM | POA: Insufficient documentation

## 2018-08-02 DIAGNOSIS — I872 Venous insufficiency (chronic) (peripheral): Secondary | ICD-10-CM | POA: Diagnosis not present

## 2018-08-02 DIAGNOSIS — M1990 Unspecified osteoarthritis, unspecified site: Secondary | ICD-10-CM | POA: Insufficient documentation

## 2018-08-02 DIAGNOSIS — I70212 Atherosclerosis of native arteries of extremities with intermittent claudication, left leg: Secondary | ICD-10-CM | POA: Diagnosis not present

## 2018-08-02 DIAGNOSIS — I890 Lymphedema, not elsewhere classified: Secondary | ICD-10-CM | POA: Diagnosis not present

## 2018-08-02 DIAGNOSIS — I10 Essential (primary) hypertension: Secondary | ICD-10-CM | POA: Insufficient documentation

## 2018-08-02 DIAGNOSIS — L97211 Non-pressure chronic ulcer of right calf limited to breakdown of skin: Secondary | ICD-10-CM | POA: Insufficient documentation

## 2018-08-02 DIAGNOSIS — Z8551 Personal history of malignant neoplasm of bladder: Secondary | ICD-10-CM | POA: Insufficient documentation

## 2018-08-03 NOTE — Progress Notes (Signed)
Steven, Meza ([id number]) Visit Report for 08/02/2018 HPI Details Patient Name: Steven, Meza Date of Service: 08/02/2018 12:45 PM Medical Record Number: [id number] Patient Account Number: [id number] Date of Birth/Sex: [birth year] (85 y.o. M) Treating RN: Cornell Barman Primary Care Provider: Fulton Reek Other Clinician: Referring Provider: Fulton Reek Treating Provider/Extender: Tito Dine in Treatment: 11 History of Present Illness HPI Description: ADMISSION 05/17/2018 Steven Meza is an 85 year old man who is been treated at the lymphedema clinic for Meza long period with lymphedema wraps for bilateral lower extremity lymphedema. About 6 months ago they noted small open areas on his left lateral calf just above the ankle. These were open. They are apparently putting some form of ointment on this. They have been referred here for our review of this. The patient has Meza long history of lymphedema and venous stasis. It says in his records that he has recurrent cellulitis although his wife denies this but she states that the lymphedema may have started with cellulitis several years ago. Also in his record there is Meza history of bladder cancer which they seem to know very little about however he did not have any radiation to the pelvis or anything that could have contributed to lymphedema that they are aware of. There is no prior wound history. The patient has 2 small punched out areas on the left lateral calf that have adherent debris on the surface. Past medical history; lymphedema, hypertension, cellulitis, hearing loss, morbid obesity, osteoarthritis, venous stasis, bladder CA, diverticulosis. ABIs in our clinic were 0.36 on the right and 0.57 on the left 05/24/2018; corrected age on this patient is 71 versus what I said last week. He has been for his arterial studies which predictably are not very good. On the right his ABI is 0.65. Monophasic waveforms noted at the right ankle  including the posterior tibial and dorsalis pedis. He has triphasic waveforms of the common femoral and profunda femoris triphasic proximal SFA with monophasic distal FSA and popliteal artery. Monophasic tibial waveforms. On the left his ABI is 0.58 monophasic waveforms at the left ankle. Duplex of the left lower extremity demonstrated atherosclerotic change with monophasic waveforms throughout the left lower extremity. There was some concern about left iliac disease and potentially femoral-popliteal and tibial disease. The patient does not describe claudication although according to his wife he over emphasizes his activity. Patient states he is limited by pain in both knees His wounds are on the left lateral calf. 2 small areas with necrotic debris. We have been using silver collagen and light Ace wraps 05/31/2018; wounds on the left lateral calf in the setting of very significant lymphedema and probably PAD. We have been using silver collagen. The base of the small wound looks reasonably improved. I sent him to see Dr. Fletcher Anon however I see now he has an appointment with Dr. Alvester Chou on February 12 2/5; left lateral calf wound looks the same. His wife is doing Meza good job with her lymphedema wraps and maintaining that the edema. He most likely has PAD and is due to see Dr. Gwenlyn Found of infectious disease on February 12 2/19; left lateral calf wounds look about the same. This looks like wound secondary to chronic venous insufficiency with lymphedema however the patient has very poor arterial studies. We referred him to Dr. Gwenlyn Found. Dr. Gwenlyn Found did not feel he needed to do anything from an arterial point of view. He did not address the question about the aggressiveness of compression. After some thoughts about  this I elected to go ahead and put him in 3 layer compression which after all would be less compression then the lymphedema clinic was putting on this. I am hopeful that this should be enough to get  some closure of the small wounds 2/26; left lateral calf wound letter this week. He tolerated the 3 layer compression well furthermore he is sleeping in Meza hospital bed which helps keep his legs up at night. The wound looks better measuring smaller especially in width 3/3; left lateral calf wound about the same size. The 3 layer compression has really helped get the edema down in his leg Steven Meza, Steven Meza. ([id number]) however. Using silver collagen 3/11; left lateral calf wound better looking wound surface but about the same size. We have been using 3 layer compression which is really helped get the edema down in the left leg. Even after Dr. Kennon Holter assessment I am reluctant to go to 4 layer compression. He is tolerating 3 layer compression well. We have been using silver collagen 3/18-Patient returns for his left lateral calf wound which overall is looking better, slightly increased in size and dimensions and area, he is tolerating the 3 layer compression, has been dressed with silver collagen which we will continue 3/25oleft lateral calf wound much better looking. Size is smaller. He has been using silver collagen 4/1; gradually getting smaller in size. Using silver collagen with 3 layer compression. I think the patient has some degree of PAD. He saw Dr. Gwenlyn Found in consultation, he did not specifically comment on whether he could tolerate 4 layer compression Electronic Signature(s) Signed: 08/02/2018 4:06:01 PM By: Linton Ham MD Entered By: Linton Ham on 08/02/2018 13:25:13 Steven Meza ([id number]) -------------------------------------------------------------------------------- Physical Exam Details Patient Name: Steven Meza Date of Service: 08/02/2018 12:45 PM Medical Record Number: [id number] Patient Account Number: [id number] Date of Birth/Sex: 05-05-1930 (85 y.o. M) Treating RN: Cornell Barman Primary Care Provider: Fulton Reek Other Clinician: Referring Provider: Fulton Reek Treating Provider/Extender: Tito Dine in Treatment: 11 Constitutional Patient is hypertensive.. Pulse regular and within target range for patient.Marland Kitchen Respirations regular, non-labored and within target range.. Temperature is normal and within the target range for the patient.Marland Kitchen appears in no distress. Eyes Conjunctivae clear. No discharge. Cardiovascular Pedal pulses absent bilaterally.. Bilateral lymphedema with chronic venous insufficiency with stasis dermatitis. Lymphatic None palpable in the popliteal or inguinal area. Integumentary (Hair, Skin) Largely stasis dermatitis with secondary lymphedema in the lower extremities. Psychiatric No evidence of depression, anxiety, or agitation. Calm, cooperative, and communicative. Appropriate interactions and affect.. Notes Wound exam; left lateral calf no debridement is required. His edema control is moderate however he cannot tolerate 4 layer compression. Electronic Signature(s) Signed: 08/02/2018 4:06:01 PM By: Linton Ham MD Entered By: Linton Ham on 08/02/2018 13:27:14 Steven Meza ([id number]) -------------------------------------------------------------------------------- Physician Orders Details Patient Name: Steven Meza Date of Service: 08/02/2018 12:45 PM Medical Record Number: [id number] Patient Account Number: [id number] Date of Birth/Sex: July 15, 1930 (85 y.o. M) Treating RN: Cornell Barman Primary Care Provider: Fulton Reek Other Clinician: Referring Provider: Fulton Reek Treating Provider/Extender: Tito Dine in Treatment: 41 Verbal / Phone Orders: No Diagnosis Coding Wound Cleansing Wound #1 Left,Lateral Lower Leg o Clean wound with Normal Saline. Anesthetic (add to Medication List) Wound #1 Left,Lateral Lower Leg o Topical Lidocaine 4% cream applied to wound bed prior to debridement (In Clinic Only). Primary Wound Dressing Wound #1 Left,Lateral Lower Leg o  Silver Collagen Secondary Dressing Wound #1 Left,Lateral Lower Leg o  ABD pad Dressing Change Frequency Wound #1 Left,Lateral Lower Leg o Change dressing every week o Other: - as needed Follow-up Appointments Wound #1 Left,Lateral Lower Leg o Return Appointment in 1 week. o Nurse Visit as needed - as needed Edema Control Wound #1 Left,Lateral Lower Leg o 3 Layer Compression System - Left Lower Extremity Electronic Signature(s) Signed: 08/02/2018 4:06:01 PM By: Linton Ham MD Signed: 08/03/2018 4:09:12 PM By: Gretta Cool, BSN, RN, CWS, Kim RN, BSN Entered By: Gretta Cool, BSN, RN, CWS, Kim on 08/02/2018 13:20:43 Steven Meza ([id number]) -------------------------------------------------------------------------------- Problem List Details Patient Name: Steven Meza Date of Service: 08/02/2018 12:45 PM Medical Record Number: [id number] Patient Account Number: [id number] Date of Birth/Sex: November 09, 1930 (85 y.o. 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 with other specified 05/17/2018 No Yes severity I89.0 Lymphedema, not elsewhere classified 05/17/2018 No Yes I70.212 Atherosclerosis of native arteries of extremities with 05/17/2018 No Yes intermittent claudication, left leg Inactive Problems Resolved Problems Electronic Signature(s) Signed: 08/02/2018 4:06:01 PM By: Linton Ham MD Entered By: Linton Ham on 08/02/2018 13:24:11 Steven Meza ([id number]) -------------------------------------------------------------------------------- Progress Note Details Patient Name: Steven Meza Date of Service: 08/02/2018 12:45 PM Medical Record Number: [id number] Patient Account Number:  [id number] Date of Birth/Sex: 1931-01-27 (85 y.o. M) Treating RN: Cornell Barman Primary Care Provider: Fulton Reek Other Clinician: Referring Provider: Fulton Reek Treating Provider/Extender: Tito Dine in Treatment: 11 Subjective History of Present Illness (HPI) ADMISSION 05/17/2018 Mr. Hogeland is an 85 year old man who is been treated at the lymphedema clinic for Meza long period with lymphedema wraps for bilateral lower extremity lymphedema. About 6 months ago they noted small open areas on his left lateral calf just above the ankle. These were open. They are apparently putting some form of ointment on this. They have been referred here for our review of this. The patient has Meza long history of lymphedema and venous stasis. It says in his records that he has recurrent cellulitis although his wife denies this but she states that the lymphedema may have started with cellulitis several years ago. Also in his record there is Meza history of bladder cancer which they seem to know very little about however he did not have any radiation to the pelvis or anything that could have contributed to lymphedema that they are aware of. There is no prior wound history. The patient has 2 small punched out areas on the left lateral calf that have adherent debris on the surface. Past medical history; lymphedema, hypertension, cellulitis, hearing loss, morbid obesity, osteoarthritis, venous stasis, bladder CA, diverticulosis. ABIs in our clinic were 0.36 on the right and 0.57 on the left 05/24/2018; corrected age on this patient is 69 versus what I said last week. He has been for his arterial studies which predictably are not very good. On the right his ABI is 0.65. Monophasic waveforms noted at the right ankle including the posterior tibial and dorsalis pedis. He has triphasic waveforms of the common femoral and profunda femoris triphasic proximal SFA with monophasic distal FSA and popliteal artery.  Monophasic tibial waveforms. On the left his ABI is 0.58 monophasic waveforms at the left ankle. Duplex of the left lower extremity demonstrated atherosclerotic change with monophasic waveforms throughout the left lower extremity. There was some concern about left iliac disease and  potentially femoral-popliteal and tibial disease. The patient does not describe claudication although according to his wife he over emphasizes his activity. Patient states he is limited by pain in both knees His wounds are on the left lateral calf. 2 small areas with necrotic debris. We have been using silver collagen and light Ace wraps 05/31/2018; wounds on the left lateral calf in the setting of very significant lymphedema and probably PAD. We have been using silver collagen. The base of the small wound looks reasonably improved. I sent him to see Dr. Fletcher Anon however I see now he has an appointment with Dr. Alvester Chou on February 12 2/5; left lateral calf wound looks the same. His wife is doing Meza good job with her lymphedema wraps and maintaining that the edema. He most likely has PAD and is due to see Dr. Gwenlyn Found of infectious disease on February 12 2/19; left lateral calf wounds look about the same. This looks like wound secondary to chronic venous insufficiency with lymphedema however the patient has very poor arterial studies. We referred him to Dr. Gwenlyn Found. Dr. Gwenlyn Found did not feel he needed to do anything from an arterial point of view. He did not address the question about the aggressiveness of compression. After some thoughts about this I elected to go ahead and put him in 3 layer compression which after all would be less compression then the lymphedema clinic was putting on this. I am hopeful that this should be enough to get some closure of the small wounds 2/26; left lateral calf wound letter this week. He tolerated the 3 layer compression well furthermore he is sleeping in Meza hospital bed which helps keep his legs up  at night. The wound looks better measuring smaller especially in width 3/3; left lateral calf wound about the same size. The 3 layer compression has really helped get the edema down in his leg however. Using silver collagen 3/11; left lateral calf wound better looking wound surface but about the same size. We have been using 3 layer compression which is really helped get the edema down in the left leg. Even after Dr. Kennon Holter assessment I am reluctant to go to 4 layer Steven Meza, Steven Meza. ([id number]) compression. He is tolerating 3 layer compression well. We have been using silver collagen 3/18-Patient returns for his left lateral calf wound which overall is looking better, slightly increased in size and dimensions and area, he is tolerating the 3 layer compression, has been dressed with silver collagen which we will continue 3/25 left lateral calf wound much better looking. Size is smaller. He has been using silver collagen 4/1; gradually getting smaller in size. Using silver collagen with 3 layer compression. I think the patient has some degree of PAD. He saw Dr. Gwenlyn Found in consultation, he did not specifically comment on whether he could tolerate 4 layer compression Objective Constitutional Patient is hypertensive.. Pulse regular and within target range for patient.Marland Kitchen Respirations regular, non-labored and within target range.. Temperature is normal and within the target range for the patient.Marland Kitchen appears in no distress. Vitals Time Taken: 1:00 PM, Height: 71 in, Weight: 252 lbs, BMI: 35.1, Temperature: 98.0 F, Pulse: 79 bpm, Respiratory Rate: 20 breaths/min, Blood Pressure: 169/64 mmHg. Eyes Conjunctivae clear. No discharge. Cardiovascular Pedal pulses absent bilaterally.. Bilateral lymphedema with chronic venous insufficiency with stasis dermatitis. Lymphatic None palpable in the popliteal or inguinal area. Psychiatric No evidence of depression, anxiety, or agitation. Calm, cooperative, and  communicative. Appropriate interactions and affect.. General Notes: Wound exam; left lateral  calf no debridement is required. His edema control is moderate however he cannot tolerate 4 layer compression. Integumentary (Hair, Skin) Largely stasis dermatitis with secondary lymphedema in the lower extremities. Wound #1 status is Open. Original cause of wound was Gradually Appeared. The wound is located on the Left,Lateral Lower Leg. The wound measures 1cm length x 0.9cm width x 0.3cm depth; 0.707cm^2 area and 0.212cm^3 volume. There is Fat Layer (Subcutaneous Tissue) Exposed exposed. There is no tunneling or undermining noted. There is Meza medium amount of serous drainage noted. The wound margin is flat and intact. There is small (1-33%) pink granulation within the wound bed. There is Meza large (67-100%) amount of necrotic tissue within the wound bed including Eschar and Adherent Slough. The periwound skin appearance exhibited: Dry/Scaly. The periwound skin appearance did not exhibit: Callus, Crepitus, Excoriation, Induration, Rash, Scarring, Maceration, Atrophie Blanche, Cyanosis, Ecchymosis, Hemosiderin Staining, Mottled, Pallor, Rubor, Erythema. Periwound temperature was noted as No Abnormality. The periwound has tenderness on palpation. Assessment Steven Meza, Steven Meza ([id number]) Active Problems ICD-10 Non-pressure chronic ulcer of left calf with other specified severity Lymphedema, not elsewhere classified Atherosclerosis of native arteries of extremities with intermittent claudication, left leg Diagnoses ICD-10 L97.228: Non-pressure chronic ulcer of left calf with other specified severity I89.0: Lymphedema, not elsewhere classified I70.212: Atherosclerosis of native arteries of extremities with intermittent claudication, left leg Plan Wound Cleansing: Wound #1 Left,Lateral Lower Leg: Clean wound with Normal Saline. Anesthetic (add to Medication List): Wound #1 Left,Lateral Lower  Leg: Topical Lidocaine 4% cream applied to wound bed prior to debridement (In Clinic Only). Primary Wound Dressing: Wound #1 Left,Lateral Lower Leg: Silver Collagen Secondary Dressing: Wound #1 Left,Lateral Lower Leg: ABD pad Dressing Change Frequency: Wound #1 Left,Lateral Lower Leg: Change dressing every week Other: - as needed Follow-up Appointments: Wound #1 Left,Lateral Lower Leg: Return Appointment in 1 week. Nurse Visit as needed - as needed Edema Control: Wound #1 Left,Lateral Lower Leg: 3 Layer Compression System - Left Lower Extremity 1. Left lateral lower leg. Wound looks healthy it does not require debridement. I think if we can put 4-layer compression on this it might actually closed Electronic Signature(s) Signed: 08/02/2018 4:06:01 PM By: Linton Ham MD Entered By: Linton Ham on 08/02/2018 13:27:50 Steven Meza ([id number]) -------------------------------------------------------------------------------- SuperBill Details Patient Name: Steven Meza Date of Service: 08/02/2018 Medical Record Number: [id number] Patient Account Number: [id number] Date of Birth/Sex: Jan 26, 1931 (85 y.o. M) Treating RN: Cornell Barman Primary Care Provider: Fulton Reek Other Clinician: Referring Provider: Fulton Reek Treating Provider/Extender: Tito Dine in Treatment: 11 Diagnosis Coding ICD-10 Codes Meza Description [phone] Non-pressure chronic ulcer of left calf with other specified severity I89.0 Lymphedema, not elsewhere classified I70.212 Atherosclerosis of native arteries of extremities with intermittent claudication, left leg Facility Procedures CPT4 Meza: [id number] Description: (Facility Use Only) [phone] - [place] LWR LT LEG Modifier: Quantity: 1 Physician Procedures CPT4 Meza: [id number] Description: 16967 - WC PHYS LEVEL 3 - EST PT ICD-10 Diagnosis Description 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

## 2018-08-04 NOTE — Progress Notes (Signed)
ARYA, BOXLEY ([id number]) Visit Report for 08/02/2018 Arrival Information Details Patient Name: Steven Meza, Steven Meza Date of Service: 08/02/2018 12:45 PM Medical Record Number: [id number] Patient Account Number: [id number] Date of Birth/Sex: [birth year] (85 y.o. M) Treating RN: Harold Barban Primary Care Evana Runnels: Fulton Reek Other Clinician: Referring Talina Pleitez: Fulton Reek Treating Nyair Depaulo/Extender: Tito Dine in Treatment: 11 Visit Information History Since Last Visit Added or deleted any medications: No Patient Arrived: Stretcher Any new allergies or adverse reactions: No Arrival Time: 12:59 Had a fall or experienced change in No Accompanied By: self activities of daily living that may affect Transfer Assistance: None risk of falls: Patient Identification Verified: Yes Signs or symptoms of abuse/neglect since last visito No Secondary Verification Process Completed: Yes Hospitalized since last visit: No Has Dressing in Place as Prescribed: Yes Has Compression in Place as Prescribed: Yes Pain Present Now: Yes Electronic Signature(s) Signed: 08/04/2018 8:16:30 AM By: Harold Barban Entered By: Harold Barban on 08/02/2018 13:11:19 Steven Meza ([id number]) -------------------------------------------------------------------------------- Encounter Discharge Information Details Patient Name: Steven Meza Date of Service: 08/02/2018 12:45 PM Medical Record Number: [id number] Patient Account Number: [id number] Date of Birth/Sex: Apr 27, 1931 (85 y.o. M) Treating RN: Army Melia Primary Care Jaselynn Tamas: Fulton Reek Other Clinician: Referring Jolon Degante: Fulton Reek Treating Sary Bogie/Extender: Tito Dine in Treatment: 11 Encounter Discharge Information Items Discharge Condition: Stable Ambulatory Status: Ambulatory Discharge Destination: Home Transportation: Private Auto Accompanied By: self Schedule Follow-up Appointment: Yes Clinical  Summary of Care: Electronic Signature(s) Signed: 08/02/2018 3:51:25 PM By: Army Melia Entered By: Army Melia on 08/02/2018 13:35:09 Steven Meza ([id number]) -------------------------------------------------------------------------------- Lower Extremity Assessment Details Patient Name: Steven Meza Date of Service: 08/02/2018 12:45 PM Medical Record Number: [id number] Patient Account Number: [id number] Date of Birth/Sex: March 17, 1931 (85 y.o. M) Treating RN: Harold Barban Primary Care Jobani Sabado: Fulton Reek Other Clinician: Referring Nethaniel Mattie: Fulton Reek Treating Hilma Steinhilber/Extender: Tito Dine in Treatment: 11 Edema Assessment Assessed: [Left: No] Patrice Paradise: No] [Left: Edema] [Right: :] Calf Left: Right: Point of Measurement: 32 cm From Medial Instep 46 cm cm Ankle Left: Right: Point of Measurement: 13 cm From Medial Instep 29 cm cm Vascular Assessment Pulses: Dorsalis Pedis Palpable: [Left:Yes] Posterior Tibial Palpable: [Left:No] Extremity colors, hair growth, and conditions: Hair Growth on Extremity: [Left:No] Temperature of Extremity: [Left:Warm < 3 seconds] Toe Nail Assessment Left: Right: Thick: Yes Discolored: Yes Deformed: Yes Improper Length and Hygiene: No Electronic Signature(s) Signed: 08/04/2018 8:16:30 AM By: Harold Barban Entered By: Harold Barban on 08/02/2018 13:16:15 Steven Meza ([id number]) -------------------------------------------------------------------------------- Multi Wound Chart Details Patient Name: Steven Meza Date of Service: 08/02/2018 12:45 PM Medical Record Number: [id number] Patient Account Number: [id number] Date of Birth/Sex: 01-19-1931 (85 y.o. M) Treating RN: Cornell Barman Primary Care Draycen Leichter: Fulton Reek Other Clinician: Referring Gardenia Witter: Fulton Reek Treating Mardell Cragg/Extender: Tito Dine in Treatment: 11 Vital Signs Height(in): 71 Pulse(bpm): 79 Weight(lbs):  252 Blood Pressure(mmHg): 169/64 Body Mass Index(BMI): 35 Temperature(F): 98.0 Respiratory Rate 20 (breaths/min): Photos: [N/A:N/A] Wound Location: Left Lower Leg - Lateral N/A N/A Wounding Event: Gradually Appeared N/A N/A Primary Etiology: Lymphedema N/A N/A Comorbid History: Lymphedema, Coronary Artery N/A N/A Disease, Hypertension Date Acquired: 11/14/2017 N/A N/A Weeks of Treatment: 11 N/A N/A Wound Status: Open N/A N/A Clustered Wound: Yes N/A N/A Measurements L x W x D 1x0.9x0.3 N/A N/A (cm) Area (cm) : 0.707 N/A N/A Volume (cm) : 0.212 N/A N/A % Reduction in Area: 77.50% N/A N/A % Reduction in Volume: 32.50% N/A N/A Classification: Full  Thickness Without N/A N/A Exposed Support Structures Exudate Amount: Medium N/A N/A Exudate Type: Serous N/A N/A Exudate Color: amber N/A N/A Wound Margin: Flat and Intact N/A N/A Granulation Amount: Small (1-33%) N/A N/A Granulation Quality: Pink N/A N/A Necrotic Amount: Large (67-100%) N/A N/A Necrotic Tissue: Eschar, Adherent Slough N/A N/A Exposed Structures: Fat Layer (Subcutaneous N/A N/A Tissue) Exposed: Yes Periwound Skin Texture: Excoriation: No N/A N/A Induration: No RAYNALD, ROUILLARD ([id number]) Callus: No Crepitus: No Rash: No Scarring: No Periwound Skin Moisture: Dry/Scaly: Yes N/A N/A Maceration: No Periwound Skin Color: Atrophie Blanche: No N/A N/A Cyanosis: No Ecchymosis: No Erythema: No Hemosiderin Staining: No Mottled: No Pallor: No Rubor: No Temperature: No Abnormality N/A N/A Tenderness on Palpation: Yes N/A N/A Wound Preparation: Ulcer Cleansing: N/A N/A Rinsed/Irrigated with Saline Topical Anesthetic Applied: Other: lidocaine 4% Treatment Notes Electronic Signature(s) Signed: 08/02/2018 4:06:01 PM By: Linton Ham MD Entered By: Linton Ham on 08/02/2018 13:24:21 Steven Meza  ([id number]) -------------------------------------------------------------------------------- Multi-Disciplinary Care Plan Details Patient Name: Steven Meza Date of Service: 08/02/2018 12:45 PM Medical Record Number: [id number] Patient Account Number: [id number] Date of Birth/Sex: 13-Mar-1931 (85 y.o. M) Treating RN: Cornell Barman Primary Care Jiayi Lengacher: Fulton Reek Other Clinician: Referring Kairy Folsom: Fulton Reek Treating Juleen Sorrels/Extender: Tito Dine in Treatment: 11 Active Inactive Orientation to the Wound Care Program Nursing Diagnoses: Knowledge deficit related to the wound healing center program Goals: Patient/caregiver will verbalize understanding of the [place] Date Initiated: 05/17/2018 Target Resolution Date: 06/17/2018 Goal Status: Active Interventions: Provide education on orientation to the wound center Notes: Venous Leg Ulcer Nursing Diagnoses: Potential for venous Insuffiency (use before diagnosis confirmed) Goals: Patient/caregiver will verbalize understanding of disease process and disease management Date Initiated: 05/17/2018 Target Resolution Date: 06/17/2018 Goal Status: Active Interventions: Assess peripheral edema status every visit. Treatment Activities: Non-invasive vascular studies : 05/17/2018 Notes: Wound/Skin Impairment Nursing Diagnoses: Impaired tissue integrity Goals: Patient/caregiver will verbalize understanding of skin care regimen Date Initiated: 05/17/2018 Target Resolution Date: 05/17/2018 Goal Status: Active REYNOLDS, KITTEL ([id number]) Ulcer/skin breakdown will heal within 14 weeks Date Initiated: 05/17/2018 Target Resolution Date: 08/16/2018 Goal Status: Active Interventions: Assess ulceration(s) every visit Treatment Activities: Referred to DME Marsena Taff for dressing supplies : 05/17/2018 Topical wound management initiated : 05/17/2018 Notes: Electronic Signature(s) Signed: 08/03/2018 4:09:12 PM  By: Gretta Cool, BSN, RN, CWS, Kim RN, BSN Entered By: Gretta Cool, BSN, RN, CWS, Kim on 08/02/2018 13:19:57 Steven Meza ([id number]) -------------------------------------------------------------------------------- Pain Assessment Details Patient Name: Steven Meza Date of Service: 08/02/2018 12:45 PM Medical Record Number: [id number] Patient Account Number: [id number] Date of Birth/Sex: 11/12/1930 (85 y.o. M) Treating RN: Harold Barban Primary Care Williamson Cavanah: Fulton Reek Other Clinician: Referring Keryn Nessler: Fulton Reek Treating Kohler Pellerito/Extender: Tito Dine in Treatment: 11 Active Problems Location of Pain Severity and Description of Pain Patient Has Paino Yes Site Locations Rate the pain. Current Pain Level: 3 Pain Management and Medication Current Pain Management: Electronic Signature(s) Signed: 08/04/2018 8:16:30 AM By: Harold Barban Entered By: Harold Barban on 08/02/2018 13:11:30 Steven Meza ([id number]) -------------------------------------------------------------------------------- Patient/Caregiver Education Details Patient Name: Steven Meza Date of Service: 08/02/2018 12:45 PM Medical Record Number: [id number] Patient Account Number: [id number] Date of Birth/Gender: 08-22-1930 (85 y.o. M) Treating RN: Cornell Barman Primary Care Physician: Fulton Reek Other Clinician: Referring Physician: Fulton Reek Treating Physician/Extender: Tito Dine in Treatment: 11 Education Assessment Education Provided To: Patient Education Topics Provided Venous: Handouts: Controlling Swelling with Compression Stockings Methods: Demonstration, Explain/Verbal Responses: State content correctly  Electronic Signature(s) Signed: 08/03/2018 4:09:12 PM By: Gretta Cool, BSN, RN, CWS, Kim RN, BSN Entered By: Gretta Cool, BSN, RN, CWS, Kim on 08/02/2018 13:21:20 Steven Meza  ([id number]) -------------------------------------------------------------------------------- Wound Assessment Details Patient Name: Steven Meza Date of Service: 08/02/2018 12:45 PM Medical Record Number: [id number] Patient Account Number: [id number] Date of Birth/Sex: 01-10-1931 (85 y.o. M) Treating RN: Harold Barban Primary Care Tilly Pernice: Fulton Reek Other Clinician: Referring Mikiah Demond: Fulton Reek Treating Kylyn Mcdade/Extender: Tito Dine in Treatment: 11 Wound Status Wound Number: 1 Primary Lymphedema Etiology: Wound Location: Left Lower Leg - Lateral Wound Status: Open Wounding Event: Gradually Appeared Comorbid Lymphedema, Coronary Artery Disease, Date Acquired: 11/14/2017 History: Hypertension Weeks Of Treatment: 11 Clustered Wound: Yes Photos Wound Measurements Length: (cm) 1 Width: (cm) 0.9 Depth: (cm) 0.3 Area: (cm) 0.707 Volume: (cm) 0.212 % Reduction in Area: 77.5% % Reduction in Volume: 32.5% Tunneling: No Undermining: No Wound Description Full Thickness Without Exposed Support Classification: Structures Wound Margin: Flat and Intact Exudate Medium Amount: Exudate Type: Serous Exudate Color: amber Foul Odor After Cleansing: No Slough/Fibrino Yes Wound Bed Granulation Amount: Small (1-33%) Exposed Structure Granulation Quality: Pink Fat Layer (Subcutaneous Tissue) Exposed: Yes Necrotic Amount: Large (67-100%) Necrotic Quality: Eschar, Adherent Slough Periwound Skin Texture Texture Color No Abnormalities Noted: No No Abnormalities Noted: No CALUB, TARNOW ([id number]) Callus: No Atrophie Blanche: No Crepitus: No Cyanosis: No Excoriation: No Ecchymosis: No Induration: No Erythema: No Rash: No Hemosiderin Staining: No Scarring: No Mottled: No Pallor: No Moisture Rubor: No No Abnormalities Noted: No Dry / Scaly: Yes Temperature / Pain Maceration: No Temperature: No Abnormality Tenderness on Palpation:  Yes Wound Preparation Ulcer Cleansing: Rinsed/Irrigated with Saline Topical Anesthetic Applied: Other: lidocaine 4%, Treatment Notes Wound #1 (Left, Lateral Lower Leg) Notes prisma, ABD, 3 layer Electronic Signature(s) Signed: 08/04/2018 8:16:30 AM By: Harold Barban Entered By: Harold Barban on 08/02/2018 13:14:53 Steven Meza ([id number]) -------------------------------------------------------------------------------- Vitals Details Patient Name: Steven Meza Date of Service: 08/02/2018 12:45 PM Medical Record Number: [id number] Patient Account Number: [id number] Date of Birth/Sex: Nov 21, 1930 (85 y.o. M) Treating RN: Harold Barban Primary Care Kanna Dafoe: Fulton Reek Other Clinician: Referring Che Rachal: Fulton Reek Treating Clodagh Odenthal/Extender: Tito Dine in Treatment: 11 Vital Signs Time Taken: 13:00 Temperature (F): 98.0 Height (in): 71 Pulse (bpm): 79 Weight (lbs): 252 Respiratory Rate (breaths/min): 20 Body Mass Index (BMI): 35.1 Blood Pressure (mmHg): 169/64 Reference Range: 80 - 120 mg / dl Electronic Signature(s) Signed: 08/04/2018 8:16:30 AM By: Harold Barban Entered By: Harold Barban on 08/02/2018 13:11:52

## 2018-08-09 ENCOUNTER — Other Ambulatory Visit: Payer: Self-pay

## 2018-08-09 ENCOUNTER — Encounter: Payer: Medicare Other | Admitting: Internal Medicine

## 2018-08-09 DIAGNOSIS — L97228 Non-pressure chronic ulcer of left calf with other specified severity: Secondary | ICD-10-CM | POA: Diagnosis not present

## 2018-08-09 NOTE — Progress Notes (Signed)
Steven Meza, Steven Meza ([id number]) Visit Report for 08/09/2018 HPI Details Patient Name: Steven Meza, Steven Meza Date of Service: 08/09/2018 12:30 PM Medical Record Number: [id number] Patient Account Number: [id number] Date of Birth/Sex: 1930/11/12 (85 y.o. M) Treating RN: Cornell Barman Primary Care Provider: Fulton Reek Other Clinician: Referring Provider: Fulton Reek Treating Provider/Extender: Tito Dine in Treatment: 12 History of Present Illness HPI Description: ADMISSION 05/17/2018 Mr. Morocho is an 85 year old man who is been treated at the lymphedema clinic for a long period with lymphedema wraps for bilateral lower extremity lymphedema. About 6 months ago they noted small open areas on his left lateral calf just above the ankle. These were open. They are apparently putting some form of ointment on this. They have been referred here for our review of this. The patient has a long history of lymphedema and venous stasis. It says in his records that he has recurrent cellulitis although his wife denies this but she states that the lymphedema may have started with cellulitis several years ago. Also in his record there is a history of bladder cancer which they seem to know very little about however he did not have any radiation to the pelvis or anything that could have contributed to lymphedema that they are aware of. There is no prior wound history. The patient has 2 small punched out areas on the left lateral calf that have adherent debris on the surface. Past medical history; lymphedema, hypertension, cellulitis, hearing loss, morbid obesity, osteoarthritis, venous stasis, bladder CA, diverticulosis. ABIs in our clinic were 0.36 on the right and 0.57 on the left 05/24/2018; corrected age on this patient is 73 versus what I said last week. He has been for his arterial studies which predictably are not very good. On the right his ABI is 0.65. Monophasic waveforms noted at the right ankle  including the posterior tibial and dorsalis pedis. He has triphasic waveforms of the common femoral and profunda femoris triphasic proximal SFA with monophasic distal FSA and popliteal artery. Monophasic tibial waveforms. On the left his ABI is 0.58 monophasic waveforms at the left ankle. Duplex of the left lower extremity demonstrated atherosclerotic change with monophasic waveforms throughout the left lower extremity. There was some concern about left iliac disease and potentially femoral-popliteal and tibial disease. The patient does not describe claudication although according to his wife he over emphasizes his activity. Patient states he is limited by pain in both knees His wounds are on the left lateral calf. 2 small areas with necrotic debris. We have been using silver collagen and light Ace wraps 05/31/2018; wounds on the left lateral calf in the setting of very significant lymphedema and probably PAD. We have been using silver collagen. The base of the small wound looks reasonably improved. I sent him to see Dr. Fletcher Anon however I see now he has an appointment with Dr. Alvester Chou on February 12 2/5; left lateral calf wound looks the same. His wife is doing a good job with her lymphedema wraps and maintaining that the edema. He most likely has PAD and is due to see Dr. Gwenlyn Found of infectious disease on February 12 2/19; left lateral calf wounds look about the same. This looks like wound secondary to chronic venous insufficiency with lymphedema however the patient has very poor arterial studies. We referred him to Dr. Gwenlyn Found. Dr. Gwenlyn Found did not feel he needed to do anything from an arterial point of view. He did not address the question about the aggressiveness of compression. After some thoughts about  this I elected to go ahead and put him in 3 layer compression which after all would be less compression then the lymphedema clinic was putting on this. I am hopeful that this should be enough to get  some closure of the small wounds 2/26; left lateral calf wound letter this week. He tolerated the 3 layer compression well furthermore he is sleeping in a hospital bed which helps keep his legs up at night. The wound looks better measuring smaller especially in width 3/3; left lateral calf wound about the same size. The 3 layer compression has really helped get the edema down in his leg Steven Meza, Steven A. ([id number]) however. Using silver collagen 3/11; left lateral calf wound better looking wound surface but about the same size. We have been using 3 layer compression which is really helped get the edema down in the left leg. Even after Dr. Kennon Holter assessment I am reluctant to go to 4 layer compression. He is tolerating 3 layer compression well. We have been using silver collagen 3/18-Patient returns for his left lateral calf wound which overall is looking better, slightly increased in size and dimensions and area, he is tolerating the 3 layer compression, has been dressed with silver collagen which we will continue 3/25oleft lateral calf wound much better looking. Size is smaller. He has been using silver collagen 4/1; gradually getting smaller in size. Using silver collagen with 3 layer compression. I think the patient has some degree of PAD. He saw Dr. Gwenlyn Found in consultation, he did not specifically comment on whether he could tolerate 4 layer compression 4/8; the wound is slightly smaller in depth. We are using 3 layer compression with silver collagen. I had him seen for arterial insufficiency however his ABI was 0.57 in the left leg. Dr. Gwenlyn Found really did not seem to middle on the degree of compression he could tolerate Electronic Signature(s) Signed: 08/09/2018 3:44:34 PM By: Linton Ham MD Entered By: Linton Ham on 08/09/2018 13:27:42 Steven Meza ([id number]) -------------------------------------------------------------------------------- Physical Exam Details Patient Name:  Steven Meza Date of Service: 08/09/2018 12:30 PM Medical Record Number: [id number] Patient Account Number: [id number] Date of Birth/Sex: 11-09-1930 (85 y.o. M) Treating RN: Cornell Barman Primary Care Provider: Fulton Reek Other Clinician: Referring Provider: Fulton Reek Treating Provider/Extender: Tito Dine in Treatment: 12 Constitutional Patient is hypertensive.. Pulse regular and within target range for patient.Marland Kitchen Respirations regular, non-labored and within target range.. Temperature is normal and within the target range for the patient.Marland Kitchen appears in no distress. Eyes Conjunctivae clear. No discharge. Ears, Nose, Mouth, and Throat Patient is hard of hearing.Marland Kitchen Respiratory Respiratory effort is easy and symmetric bilaterally. Rate is normal at rest and on room air.. Cardiovascular Not palpable in the left foot. Lymphatic None palpable in the popliteal area bilaterally. Integumentary (Hair, Skin) Changes of chronic lymphedema with elephantiasis. Psychiatric No evidence of depression, anxiety, or agitation. Calm, cooperative, and communicative. Appropriate interactions and affect.. Notes Wound exam; left lateral calf. Perhaps slightly less depth to the wound which otherwise appears healthy. He has severe chronic venous insufficiency with lymphedema and lymphedematous skin changes complicating wound healing. His peripheral pulses are not palpable in the foot Electronic Signature(s) Signed: 08/09/2018 3:44:34 PM By: Linton Ham MD Entered By: Linton Ham on 08/09/2018 13:30:20 Steven Meza ([id number]) -------------------------------------------------------------------------------- Physician Orders Details Patient Name: Steven Meza Date of Service: 08/09/2018 12:30 PM Medical Record Number: [id number] Patient Account Number: [id number] Date of Birth/Sex: 08/17/1930 (85 y.o. M) Treating RN: Cornell Barman Primary Care  Provider: Fulton Reek Other  Clinician: Referring Provider: Fulton Reek Treating Provider/Extender: Tito Dine in Treatment: 12 Verbal / Phone Orders: No Diagnosis Coding Wound Cleansing Wound #1 Left,Lateral Lower Leg o Clean wound with Normal Saline. Anesthetic (add to Medication List) Wound #1 Left,Lateral Lower Leg o Topical Lidocaine 4% cream applied to wound bed prior to debridement (In Clinic Only). Primary Wound Dressing Wound #1 Left,Lateral Lower Leg o Silver Collagen Secondary Dressing Wound #1 Left,Lateral Lower Leg o ABD pad Dressing Change Frequency Wound #1 Left,Lateral Lower Leg o Change dressing every week o Other: - as needed Follow-up Appointments Wound #1 Left,Lateral Lower Leg o Return Appointment in 1 week. o Nurse Visit as needed - as needed Edema Control Wound #1 Left,Lateral Lower Leg o 3 Layer Compression System - Left Lower Extremity Electronic Signature(s) Signed: 08/09/2018 3:44:34 PM By: Linton Ham MD Signed: 08/09/2018 4:47:28 PM By: Gretta Cool, BSN, RN, CWS, Kim RN, BSN Entered By: Gretta Cool, BSN, RN, CWS, Kim on 08/09/2018 13:06:13 Steven Meza ([id number]) -------------------------------------------------------------------------------- Problem List Details Patient Name: Steven Meza Date of Service: 08/09/2018 12:30 PM Medical Record Number: [id number] Patient Account Number: [id number] Date of Birth/Sex: 1931-02-25 (85 y.o. 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 with other specified 05/17/2018 No Yes severity I89.0 Lymphedema, not elsewhere classified 05/17/2018 No Yes I70.212 Atherosclerosis of native arteries of extremities with 05/17/2018 No  Yes intermittent claudication, left leg Inactive Problems Resolved Problems Electronic Signature(s) Signed: 08/09/2018 3:44:34 PM By: Linton Ham MD Entered By: Linton Ham on 08/09/2018 13:26:20 Steven Meza ([id number]) -------------------------------------------------------------------------------- Progress Note Details Patient Name: Steven Meza Date of Service: 08/09/2018 12:30 PM Medical Record Number: [id number] Patient Account Number: [id number] Date of Birth/Sex: [birth year] (85 y.o. M) Treating RN: Cornell Barman Primary Care Provider: Fulton Reek Other Clinician: Referring Provider: Fulton Reek Treating Provider/Extender: Tito Dine in Treatment: 12 Subjective History of Present Illness (HPI) ADMISSION 05/17/2018 Mr. Muchow is an 85 year old man who is been treated at the lymphedema clinic for a long period with lymphedema wraps for bilateral lower extremity lymphedema. About 6 months ago they noted small open areas on his left lateral calf just above the ankle. These were open. They are apparently putting some form of ointment on this. They have been referred here for our review of this. The patient has a long history of lymphedema and venous stasis. It says in his records that he has recurrent cellulitis although his wife denies this but she states that the lymphedema may have started with cellulitis several years ago. Also in his record there is a history of bladder cancer which they seem to know very little about however he did not have any radiation to the pelvis or anything that could have contributed to lymphedema that they are aware of. There is no prior wound history. The patient has 2 small punched out areas on the left lateral calf that have adherent debris on the surface. Past medical history; lymphedema, hypertension, cellulitis, hearing loss, morbid obesity, osteoarthritis, venous stasis, bladder CA, diverticulosis. ABIs in our clinic  were 0.36 on the right and 0.57 on the left 05/24/2018; corrected age on this patient is 26 versus what I said last week. He has been for his arterial studies which predictably are not very good. On  the right his ABI is 0.65. Monophasic waveforms noted at the right ankle including the posterior tibial and dorsalis pedis. He has triphasic waveforms of the common femoral and profunda femoris triphasic proximal SFA with monophasic distal FSA and popliteal artery. Monophasic tibial waveforms. On the left his ABI is 0.58 monophasic waveforms at the left ankle. Duplex of the left lower extremity demonstrated atherosclerotic change with monophasic waveforms throughout the left lower extremity. There was some concern about left iliac disease and potentially femoral-popliteal and tibial disease. The patient does not describe claudication although according to his wife he over emphasizes his activity. Patient states he is limited by pain in both knees His wounds are on the left lateral calf. 2 small areas with necrotic debris. We have been using silver collagen and light Ace wraps 05/31/2018; wounds on the left lateral calf in the setting of very significant lymphedema and probably PAD. We have been using silver collagen. The base of the small wound looks reasonably improved. I sent him to see Dr. Fletcher Anon however I see now he has an appointment with Dr. Alvester Chou on February 12 2/5; left lateral calf wound looks the same. His wife is doing a good job with her lymphedema wraps and maintaining that the edema. He most likely has PAD and is due to see Dr. Gwenlyn Found of infectious disease on February 12 2/19; left lateral calf wounds look about the same. This looks like wound secondary to chronic venous insufficiency with lymphedema however the patient has very poor arterial studies. We referred him to Dr. Gwenlyn Found. Dr. Gwenlyn Found did not feel he needed to do anything from an arterial point of view. He did not address the question  about the aggressiveness of compression. After some thoughts about this I elected to go ahead and put him in 3 layer compression which after all would be less compression then the lymphedema clinic was putting on this. I am hopeful that this should be enough to get some closure of the small wounds 2/26; left lateral calf wound letter this week. He tolerated the 3 layer compression well furthermore he is sleeping in a hospital bed which helps keep his legs up at night. The wound looks better measuring smaller especially in width 3/3; left lateral calf wound about the same size. The 3 layer compression has really helped get the edema down in his leg however. Using silver collagen 3/11; left lateral calf wound better looking wound surface but about the same size. We have been using 3 layer compression which is really helped get the edema down in the left leg. Even after Dr. Kennon Holter assessment I am reluctant to go to 4 layer Steven Meza, Steven A. ([id number]) compression. He is tolerating 3 layer compression well. We have been using silver collagen 3/18-Patient returns for his left lateral calf wound which overall is looking better, slightly increased in size and dimensions and area, he is tolerating the 3 layer compression, has been dressed with silver collagen which we will continue 3/25 left lateral calf wound much better looking. Size is smaller. He has been using silver collagen 4/1; gradually getting smaller in size. Using silver collagen with 3 layer compression. I think the patient has some degree of PAD. He saw Dr. Gwenlyn Found in consultation, he did not specifically comment on whether he could tolerate 4 layer compression 4/8; the wound is slightly smaller in depth. We are using 3 layer compression with silver collagen. I had him seen for arterial insufficiency however his ABI was 0.57 in  the left leg. Dr. Gwenlyn Found really did not seem to middle on the degree of compression he could  tolerate Objective Constitutional Patient is hypertensive.. Pulse regular and within target range for patient.Marland Kitchen Respirations regular, non-labored and within target range.. Temperature is normal and within the target range for the patient.Marland Kitchen appears in no distress. Vitals Time Taken: 12:44 PM, Height: 71 in, Weight: 252 lbs, BMI: 35.1, Temperature: 98.7 F, Pulse: 71 bpm, Respiratory Rate: 18 breaths/min, Blood Pressure: 144/51 mmHg. Eyes Conjunctivae clear. No discharge. Ears, Nose, Mouth, and Throat Patient is hard of hearing.Marland Kitchen Respiratory Respiratory effort is easy and symmetric bilaterally. Rate is normal at rest and on room air.. Cardiovascular Not palpable in the left foot. Lymphatic None palpable in the popliteal area bilaterally. Psychiatric No evidence of depression, anxiety, or agitation. Calm, cooperative, and communicative. Appropriate interactions and affect.. General Notes: Wound exam; left lateral calf. Perhaps slightly less depth to the wound which otherwise appears healthy. He has severe chronic venous insufficiency with lymphedema and lymphedematous skin changes complicating wound healing. His peripheral pulses are not palpable in the foot Integumentary (Hair, Skin) Changes of chronic lymphedema with elephantiasis. Wound #1 status is Open. Original cause of wound was Gradually Appeared. The wound is located on the Left,Lateral Lower Leg. The wound measures 1.1cm length x 1cm width x 0.2cm depth; 0.864cm^2 area and 0.173cm^3 volume. There is Fat Layer (Subcutaneous Tissue) Exposed exposed. There is no tunneling or undermining noted. There is a medium amount of serous drainage noted. The wound margin is flat and intact. There is medium (34-66%) pink granulation within the wound bed. There is a medium (34-66%) amount of necrotic tissue within the wound bed including Adherent Slough. The periwound Steven Meza, Steven Meza ([id number]) skin appearance exhibited: Dry/Scaly. The  periwound skin appearance did not exhibit: Callus, Crepitus, Excoriation, Induration, Rash, Scarring, Maceration, Atrophie Blanche, Cyanosis, Ecchymosis, Hemosiderin Staining, Mottled, Pallor, Rubor, Erythema. Periwound temperature was noted as No Abnormality. The periwound has tenderness on palpation. Assessment Active Problems ICD-10 Non-pressure chronic ulcer of left calf with other specified severity Lymphedema, not elsewhere classified Atherosclerosis of native arteries of extremities with intermittent claudication, left leg Diagnoses ICD-10 L97.228: Non-pressure chronic ulcer of left calf with other specified severity I89.0: Lymphedema, not elsewhere classified I70.212: Atherosclerosis of native arteries of extremities with intermittent claudication, left leg Plan Wound Cleansing: Wound #1 Left,Lateral Lower Leg: Clean wound with Normal Saline. Anesthetic (add to Medication List): Wound #1 Left,Lateral Lower Leg: Topical Lidocaine 4% cream applied to wound bed prior to debridement (In Clinic Only). Primary Wound Dressing: Wound #1 Left,Lateral Lower Leg: Silver Collagen Secondary Dressing: Wound #1 Left,Lateral Lower Leg: ABD pad Dressing Change Frequency: Wound #1 Left,Lateral Lower Leg: Change dressing every week Other: - as needed Follow-up Appointments: Wound #1 Left,Lateral Lower Leg: Return Appointment in 1 week. Nurse Visit as needed - as needed Edema Control: Wound #1 Left,Lateral Lower Leg: 3 Layer Compression System - Left Lower Extremity 1. I am continuing the silver collagen under 3 layer compression. I am tempted to increase this to 4 layer compression however his ABI would really preclude safely doing that. Steven Meza, Steven Meza ([id number]) Electronic Signature(s) Signed: 08/09/2018 3:44:34 PM By: Linton Ham MD Entered By: Linton Ham on 08/09/2018 13:31:25 Steven Meza  ([id number]) -------------------------------------------------------------------------------- SuperBill Details Patient Name: Steven Meza Date of Service: 08/09/2018 Medical Record Number: [id number] Patient Account Number: [id number] Date of Birth/Sex: 03/21/1931 (85 y.o. M) Treating RN: Cornell Barman Primary Care Provider: Fulton Reek Other Clinician: Referring Provider: Doy Hutching,  Dellis Filbert Treating Provider/Extender: Tito Dine in Treatment: 12 Diagnosis Coding ICD-10 Codes Meza Description L97.228 Non-pressure chronic ulcer of left calf with other specified severity I89.0 Lymphedema, not elsewhere classified I70.212 Atherosclerosis of native arteries of extremities with intermittent claudication, left leg Facility Procedures CPT4 Meza: [id number] Description: (Facility Use Only) [phone] - [facility] COMPRS LWR LT LEG Modifier: Quantity: 1 Physician Procedures CPT4 Meza Description: [id number] - WC PHYS LEVEL 3 - EST PT ICD-10 Diagnosis Description 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

## 2018-08-09 NOTE — Progress Notes (Signed)
Steven Meza, Steven Meza ([id number]) Visit Report for 08/09/2018 Arrival Information Details Patient Name: Steven Meza, Steven Meza Date of Service: 08/09/2018 12:30 PM Medical Record Number: [id number] Patient Account Number: [id number] Date of Birth/Sex: [birth year] (86 y.o. M) Treating RN: Montey Hora Primary Care Steven Meza: Fulton Reek Other Clinician: Referring Kathrine Rieves: Fulton Reek Treating Hellen Shanley/Extender: Tito Dine in Treatment: 12 Visit Information History Since Last Visit Added or deleted any medications: No Patient Arrived: Cane Any new allergies or adverse reactions: No Arrival Time: 12:42 Had a fall or experienced change in No Accompanied By: self activities of daily living that may affect Transfer Assistance: None risk of falls: Patient Identification Verified: Yes Signs or symptoms of abuse/neglect since last visito No Secondary Verification Process Completed: Yes Hospitalized since last visit: No Implantable device outside of the clinic excluding No cellular tissue based products placed in the center since last visit: Has Dressing in Place as Prescribed: Yes Has Compression in Place as Prescribed: Yes Pain Present Now: No Electronic Signature(s) Signed: 08/09/2018 2:47:29 PM By: Montey Hora Entered By: Montey Hora on 08/09/2018 12:42:33 Steven Meza ([id number]) -------------------------------------------------------------------------------- Encounter Discharge Information Details Patient Name: Steven Meza Date of Service: 08/09/2018 12:30 PM Medical Record Number: [id number] Patient Account Number: [id number] Date of Birth/Sex: 07/26/1930 (85 y.o. M) Treating RN: Army Melia Primary Care Marlan Steward: Fulton Reek Other Clinician: Referring Delson Dulworth: Fulton Reek Treating Decker Cogdell/Extender: Tito Dine in Treatment: 12 Encounter Discharge Information Items Discharge Condition: Stable Ambulatory Status: Cane Discharge  Destination: Home Transportation: Private Auto Accompanied By: self Schedule Follow-up Appointment: Yes Clinical Summary of Care: Electronic Signature(s) Signed: 08/09/2018 2:47:11 PM By: Army Melia Entered By: Army Melia on 08/09/2018 13:18:23 Steven Meza ([id number]) -------------------------------------------------------------------------------- Lower Extremity Assessment Details Patient Name: Steven Meza Date of Service: 08/09/2018 12:30 PM Medical Record Number: [id number] Patient Account Number: [id number] Date of Birth/Sex: [birth year] (85 y.o. M) Treating RN: Montey Hora Primary Care Inez Stantz: Fulton Reek Other Clinician: Referring Thetis Schwimmer: Fulton Reek Treating Sherlie Boyum/Extender: Tito Dine in Treatment: 12 Edema Assessment Assessed: [Left: No] [Right: No] Edema: [Left: Ye] [Right: s] Calf Left: Right: Point of Measurement: 32 cm From Medial Instep 45 cm cm Ankle Left: Right: Point of Measurement: 13 cm From Medial Instep 28.5 cm cm Vascular Assessment Pulses: Dorsalis Pedis Palpable: [Left:Yes Yes] Electronic Signature(s) Signed: 08/09/2018 2:47:29 PM By: Montey Hora Entered By: Montey Hora on 08/09/2018 12:54:18 Steven Meza ([id number]) -------------------------------------------------------------------------------- Multi Wound Chart Details Patient Name: Steven Meza Date of Service: 08/09/2018 12:30 PM Medical Record Number: [id number] Patient Account Number: [id number] Date of Birth/Sex: 07-18-1930 (85 y.o. M) Treating RN: Cornell Barman Primary Care Mellisa Arshad: Fulton Reek Other Clinician: Referring Noel Henandez: Fulton Reek Treating Estefano Victory/Extender: Tito Dine in Treatment: 12 Vital Signs Height(in): 71 Pulse(bpm): 71 Weight(lbs): 252 Blood Pressure(mmHg): 144/51 Body Mass Index(BMI): 35 Temperature(F): 98.7 Respiratory Rate 18 (breaths/min): Photos: [N/A:N/A] Wound Location: Left Lower  Leg - Lateral N/A N/A Wounding Event: Gradually Appeared N/A N/A Primary Etiology: Lymphedema N/A N/A Comorbid History: Lymphedema, Coronary Artery N/A N/A Disease, Hypertension Date Acquired: 11/14/2017 N/A N/A Weeks of Treatment: 12 N/A N/A Wound Status: Open N/A N/A Clustered Wound: Yes N/A N/A Measurements L x W x D 1.1x1x0.2 N/A N/A (cm) Area (cm) : 0.864 N/A N/A Volume (cm) : 0.173 N/A N/A % Reduction in Area: 72.50% N/A N/A % Reduction in Volume: 44.90% N/A N/A Classification: Full Thickness Without N/A N/A Exposed Support Structures Exudate Amount: Medium N/A N/A Exudate Type: Serous N/A N/A  Exudate Color: amber N/A N/A Wound Margin: Flat and Intact N/A N/A Granulation Amount: Medium (34-66%) N/A N/A Granulation Quality: Pink N/A N/A Necrotic Amount: Medium (34-66%) N/A N/A Exposed Structures: Fat Layer (Subcutaneous N/A N/A Tissue) Exposed: Yes Epithelialization: None N/A N/A Periwound Skin Texture: Excoriation: No N/A N/A Induration: No Steven Meza, Steven Meza ([id number]) Callus: No Crepitus: No Rash: No Scarring: No Periwound Skin Moisture: Dry/Scaly: Yes N/A N/A Maceration: No Periwound Skin Color: Atrophie Blanche: No N/A N/A Cyanosis: No Ecchymosis: No Erythema: No Hemosiderin Staining: No Mottled: No Pallor: No Rubor: No Temperature: No Abnormality N/A N/A Tenderness on Palpation: Yes N/A N/A Treatment Notes Wound #1 (Left, Lateral Lower Leg) Notes prisma, ABD, 3 layer Electronic Signature(s) Signed: 08/09/2018 3:44:34 PM By: Linton Ham MD Entered By: Linton Ham on 08/09/2018 13:26:26 Steven Meza ([id number]) -------------------------------------------------------------------------------- Multi-Disciplinary Care Plan Details Patient Name: Steven Meza Date of Service: 08/09/2018 12:30 PM Medical Record Number: [id number] Patient Account Number: [id number] Date of Birth/Sex: [birth year] (85 y.o. M) Treating RN: Cornell Barman Primary  Care Gwenivere Hiraldo: Fulton Reek Other Clinician: Referring Rahul Malinak: Fulton Reek Treating Saifullah Jolley/Extender: Tito Dine in Treatment: 12 Active Inactive Orientation to the Wound Care Program Nursing Diagnoses: Knowledge deficit related to the wound healing center program Goals: Patient/caregiver will verbalize understanding of the [place] Program Date Initiated: 05/17/2018 Target Resolution Date: 06/17/2018 Goal Status: Active Interventions: Provide education on orientation to the wound center Notes: Venous Leg Ulcer Nursing Diagnoses: Potential for venous Insuffiency (use before diagnosis confirmed) Goals: Patient/caregiver will verbalize understanding of disease process and disease management Date Initiated: 05/17/2018 Target Resolution Date: 06/17/2018 Goal Status: Active Interventions: Assess peripheral edema status every visit. Treatment Activities: Non-invasive vascular studies : 05/17/2018 Notes: Wound/Skin Impairment Nursing Diagnoses: Impaired tissue integrity Goals: Patient/caregiver will verbalize understanding of skin care regimen Date Initiated: 05/17/2018 Target Resolution Date: 05/17/2018 Goal Status: Active Steven Meza, Steven Meza ([id number]) Ulcer/skin breakdown will heal within 14 weeks Date Initiated: 05/17/2018 Target Resolution Date: 08/16/2018 Goal Status: Active Interventions: Assess ulceration(s) every visit Treatment Activities: Referred to DME Tanairi Cypert for dressing supplies : 05/17/2018 Topical wound management initiated : 05/17/2018 Notes: Electronic Signature(s) Signed: 08/09/2018 4:47:28 PM By: Gretta Cool, BSN, RN, CWS, Kim RN, BSN Entered By: Gretta Cool, BSN, RN, CWS, Kim on 08/09/2018 13:05:24 Steven Meza ([id number]) -------------------------------------------------------------------------------- Pain Assessment Details Patient Name: Steven Meza Date of Service: 08/09/2018 12:30 PM Medical Record Number:  [id number] Patient Account Number: [id number] Date of Birth/Sex: 12/09/1930 (85 y.o. M) Treating RN: Montey Hora Primary Care Kortlyn Koltz: Fulton Reek Other Clinician: Referring Stepanie Graver: Fulton Reek Treating Griffey Nicasio/Extender: Tito Dine in Treatment: 12 Active Problems Location of Pain Severity and Description of Pain Patient Has Paino No Site Locations Pain Management and Medication Current Pain Management: Electronic Signature(s) Signed: 08/09/2018 2:47:29 PM By: Montey Hora Entered By: Montey Hora on 08/09/2018 12:43:12 Steven Meza ([id number]) -------------------------------------------------------------------------------- Patient/Caregiver Education Details Patient Name: Steven Meza Date of Service: 08/09/2018 12:30 PM Medical Record Number: [id number] Patient Account Number: [id number] Date of Birth/Gender: 01/10/1931 (85 y.o. M) Treating RN: Cornell Barman Primary Care Physician: Fulton Reek Other Clinician: Referring Physician: Fulton Reek Treating Physician/Extender: Tito Dine in Treatment: 12 Education Assessment Education Provided To: Patient Education Topics Provided Venous: Handouts: Controlling Swelling with Multilayered Compression Wraps Methods: Demonstration, Explain/Verbal Responses: State content correctly Wound/Skin Impairment: Handouts: Caring for Your Ulcer Methods: Demonstration, Explain/Verbal Responses: State content correctly Electronic Signature(s) Signed: 08/09/2018 4:47:28 PM By: Gretta Cool, BSN, RN, CWS, Kim RN, BSN Entered By:  Gretta Cool, BSN, RN, CWS, Kim on 08/09/2018 13:06:55 Steven Meza ([id number]) -------------------------------------------------------------------------------- Wound Assessment Details Patient Name: Steven Meza, Steven Meza Date of Service: 08/09/2018 12:30 PM Medical Record Number: [id number] Patient Account Number: [id number] Date of Birth/Sex: Jun 08, 1930 (85 y.o. M) Treating RN:  Montey Hora Primary Care Blaze Nylund: Fulton Reek Other Clinician: Referring Namine Beahm: Fulton Reek Treating Tahsin Benyo/Extender: Tito Dine in Treatment: 12 Wound Status Wound Number: 1 Primary Lymphedema Etiology: Wound Location: Left Lower Leg - Lateral Wound Status: Open Wounding Event: Gradually Appeared Comorbid Lymphedema, Coronary Artery Disease, Date Acquired: 11/14/2017 History: Hypertension Weeks Of Treatment: 12 Clustered Wound: Yes Photos Photo Uploaded By: Montey Hora on 08/09/2018 12:54:50 Wound Measurements Length: (cm) 1.1 Width: (cm) 1 Depth: (cm) 0.2 Area: (cm) 0.864 Volume: (cm) 0.173 % Reduction in Area: 72.5% % Reduction in Volume: 44.9% Epithelialization: None Tunneling: No Undermining: No Wound Description Full Thickness Without Exposed Support Foul O Classification: Structures Slough Wound Margin: Flat and Intact Exudate Medium Amount: Exudate Type: Serous Exudate Color: amber dor After Cleansing: No /Fibrino Yes Wound Bed Granulation Amount: Medium (34-66%) Exposed Structure Granulation Quality: Pink Fat Layer (Subcutaneous Tissue) Exposed: Yes Necrotic Amount: Medium (34-66%) Necrotic Quality: Adherent Slough Periwound Skin Texture Texture Color No Abnormalities Noted: No No Abnormalities Noted: No Steven Meza, Steven Meza ([id number]) Callus: No Atrophie Blanche: No Crepitus: No Cyanosis: No Excoriation: No Ecchymosis: No Induration: No Erythema: No Rash: No Hemosiderin Staining: No Scarring: No Mottled: No Pallor: No Moisture Rubor: No No Abnormalities Noted: No Dry / Scaly: Yes Temperature / Pain Maceration: No Temperature: No Abnormality Tenderness on Palpation: Yes Treatment Notes Wound #1 (Left, Lateral Lower Leg) Notes prisma, ABD, 3 layer Electronic Signature(s) Signed: 08/09/2018 2:47:29 PM By: Montey Hora Entered By: Montey Hora on 08/09/2018 12:53:06 Steven Meza  ([id number]) -------------------------------------------------------------------------------- Vitals Details Patient Name: Steven Meza Date of Service: 08/09/2018 12:30 PM Medical Record Number: [id number] Patient Account Number: [id number] Date of Birth/Sex: January 18, 1931 (85 y.o. M) Treating RN: Montey Hora Primary Care Leshaun Biebel: Fulton Reek Other Clinician: Referring Mekhi Sonn: Fulton Reek Treating Marguerette Sheller/Extender: Tito Dine in Treatment: 12 Vital Signs Time Taken: 12:44 Temperature (F): 98.7 Height (in): 71 Pulse (bpm): 71 Weight (lbs): 252 Respiratory Rate (breaths/min): 18 Body Mass Index (BMI): 35.1 Blood Pressure (mmHg): 144/51 Reference Range: 80 - 120 mg / dl Electronic Signature(s) Signed: 08/09/2018 2:47:29 PM By: Montey Hora Entered By: Montey Hora on 08/09/2018 12:47:22

## 2018-08-16 ENCOUNTER — Encounter: Payer: Medicare Other | Admitting: Internal Medicine

## 2018-08-16 ENCOUNTER — Other Ambulatory Visit: Payer: Self-pay

## 2018-08-16 DIAGNOSIS — L97228 Non-pressure chronic ulcer of left calf with other specified severity: Secondary | ICD-10-CM | POA: Diagnosis not present

## 2018-08-16 NOTE — Progress Notes (Signed)
ALDRICH, LLOYD ([id number]) Visit Report for 08/16/2018 Arrival Information Details Patient Name: Steven Meza, Steven Meza Date of Service: 08/16/2018 11:30 AM Medical Record Number: [id number] Patient Account Number: [id number] Date of Birth/Sex: 07/27/1930 (85 y.o. M) Treating RN: Harold Barban Primary Care Tomy Khim: Fulton Reek Other Clinician: Referring Keyron Pokorski: Fulton Reek Treating Huel Centola/Extender: Tito Dine in Treatment: 42 Visit Information History Since Last Visit Added or deleted any medications: No Patient Arrived: Cane Any new allergies or adverse reactions: No Arrival Time: 12:32 Had a fall or experienced change in No Accompanied By: self activities of daily living that may affect Transfer Assistance: None risk of falls: Patient Identification Verified: Yes Signs or symptoms of abuse/neglect since last visito No Secondary Verification Process Completed: Yes Hospitalized since last visit: No Has Dressing in Place as Prescribed: Yes Has Compression in Place as Prescribed: Yes Pain Present Now: No Electronic Signature(s) Signed: 08/16/2018 1:06:36 PM By: Harold Barban Entered By: Harold Barban on 08/16/2018 12:34:22 [name] ([id number]) -------------------------------------------------------------------------------- Compression Therapy Details Patient Name: [name] Date of Service: 08/16/2018 11:30 AM Medical Record Number: [id number] Patient Account Number: [id number] Date of Birth/Sex: 09/26/1930 (85 y.o. M) Treating RN: Cornell Barman Primary Care Woods Gangemi: Fulton Reek Other Clinician: Referring Yuleni Burich: Fulton Reek Treating Arlinda Barcelona/Extender: Tito Dine in Treatment: 13 Compression Therapy Performed for Wound Assessment: Wound #1 Left,Lateral Lower Leg Performed By: Clinician Cornell Barman, RN Compression Type: Three Layer Pre Treatment ABI: 0.6 Post Procedure Diagnosis Same as Pre-procedure Electronic  Signature(s) Signed: 08/16/2018 3:17:52 PM By: Gretta Cool, BSN, RN, CWS, Kim RN, BSN Entered By: Gretta Cool, BSN, RN, CWS, Kim on 08/16/2018 13:01:15 [name] ([id number]) -------------------------------------------------------------------------------- Lower Extremity Assessment Details Patient Name: [name] Date of Service: 08/16/2018 11:30 AM Medical Record Number: [id number] Patient Account Number: [id number] Date of Birth/Sex: June 03, 1930 (85 y.o. M) Treating RN: Montey Hora Primary Care Aerilyn Slee: Fulton Reek Other Clinician: Referring Folasade Mooty: Fulton Reek Treating Zarea Diesing/Extender: Tito Dine in Treatment: 13 Edema Assessment Assessed: [Left: No] [Right: No] Edema: [Left: Ye] [Right: s] Calf Left: Right: Point of Measurement: 32 cm From Medial Instep 45.5 cm cm Ankle Left: Right: Point of Measurement: 13 cm From Medial Instep 28.5 cm cm Vascular Assessment Pulses: Dorsalis Pedis Palpable: [Left:Yes] Electronic Signature(s) Signed: 08/16/2018 1:17:32 PM By: Montey Hora Entered By: Montey Hora on 08/16/2018 12:42:16 [name] ([id number]) -------------------------------------------------------------------------------- Multi Wound Chart Details Patient Name: [name] Date of Service: 08/16/2018 11:30 AM Medical Record Number: [id number] Patient Account Number: [id number] Date of Birth/Sex: [birth year] (85 y.o. M) Treating RN: Cornell Barman Primary Care Dhruva Orndoff: Fulton Reek Other Clinician: Referring Ceilidh Torregrossa: Fulton Reek Treating Antino Mayabb/Extender: Tito Dine in Treatment: 29 Photos: [N/A:N/A] Wound Location: Left Lower Leg - Lateral N/A N/A Wounding Event: Gradually Appeared N/A N/A Primary Etiology: Lymphedema N/A N/A Comorbid History: Lymphedema, Coronary Artery N/A N/A Disease, Hypertension Date Acquired: 11/14/2017 N/A N/A Weeks of Treatment: 13 N/A N/A Wound Status: Open N/A N/A Clustered Wound:  Yes N/A N/A Measurements L x W x D 0.7x0.9x0.2 N/A N/A (cm) Area (cm) : 0.495 N/A N/A Volume (cm) : 0.099 N/A N/A % Reduction in Area: 84.20% N/A N/A % Reduction in Volume: 68.50% N/A N/A Classification: Full Thickness Without N/A N/A Exposed Support Structures Exudate Amount: Medium N/A N/A Exudate Type: Serous N/A N/A Exudate Color: amber N/A N/A Wound Margin: Flat and Intact N/A N/A Granulation Amount: Large (67-100%) N/A N/A Granulation Quality: Pink N/A N/A Necrotic Amount: Small (1-33%) N/A N/A Exposed  Structures: Fat Layer (Subcutaneous N/A N/A Tissue) Exposed: Yes Epithelialization: None N/A N/A Periwound Skin Texture: Excoriation: No N/A N/A Induration: No Callus: No Crepitus: No Rash: No Scarring: No Periwound Skin Moisture: Dry/Scaly: Yes N/A N/A Maceration: No Periwound Skin Color: Hemosiderin Staining: Yes N/A N/A Atrophie Blanche: No CALIPH, BOROWIAK ([id number]) Cyanosis: No Ecchymosis: No Erythema: No Mottled: No Pallor: No Rubor: No Temperature: No Abnormality N/A N/A Tenderness on Palpation: No N/A N/A Procedures Performed: Compression Therapy N/A N/A Treatment Notes Electronic Signature(s) Signed: 08/16/2018 2:01:47 PM By: Linton Ham MD Entered By: Linton Ham on 08/16/2018 13:08:14 [name] ([id number]) -------------------------------------------------------------------------------- Multi-Disciplinary Care Plan Details Patient Name: [name] Date of Service: 08/16/2018 11:30 AM Medical Record Number: [id number] Patient Account Number: [id number] Date of Birth/Sex: [birth year] (85 y.o. M) Treating RN: Cornell Barman Primary Care Venetia Prewitt: Fulton Reek Other Clinician: Referring Julio Storr: Fulton Reek Treating Edi Gorniak/Extender: Tito Dine in Treatment: 41 Active Inactive Orientation to the Wound Care Program Nursing Diagnoses: Knowledge deficit related to the wound healing center  program Goals: Patient/caregiver will verbalize understanding of the [place] Date Initiated: 05/17/2018 Target Resolution Date: 06/17/2018 Goal Status: Active Interventions: Provide education on orientation to the wound center Notes: Venous Leg Ulcer Nursing Diagnoses: Potential for venous Insuffiency (use before diagnosis confirmed) Goals: Patient/caregiver will verbalize understanding of disease process and disease management Date Initiated: 05/17/2018 Target Resolution Date: 06/17/2018 Goal Status: Active Interventions: Assess peripheral edema status every visit. Treatment Activities: Non-invasive vascular studies : 05/17/2018 Notes: Wound/Skin Impairment Nursing Diagnoses: Impaired tissue integrity Goals: Patient/caregiver will verbalize understanding of skin care regimen Date Initiated: 05/17/2018 Target Resolution Date: 05/17/2018 Goal Status: Active CHAE, OOMMEN ([id number]) Ulcer/skin breakdown will heal within 14 weeks Date Initiated: 05/17/2018 Target Resolution Date: 08/16/2018 Goal Status: Active Interventions: Assess ulceration(s) every visit Treatment Activities: Referred to DME Nicolet Griffy for dressing supplies : 05/17/2018 Topical wound management initiated : 05/17/2018 Notes: Electronic Signature(s) Signed: 08/16/2018 3:17:52 PM By: Gretta Cool, BSN, RN, CWS, Kim RN, BSN Entered By: Gretta Cool, BSN, RN, CWS, Kim on 08/16/2018 13:00:41 [name] ([id number]) -------------------------------------------------------------------------------- Pain Assessment Details Patient Name: [name] Date of Service: 08/16/2018 11:30 AM Medical Record Number: [id number] Patient Account Number: [id number] Date of Birth/Sex: [birth year] (85 y.o. M) Treating RN: Harold Barban Primary Care Jaceyon Strole: Fulton Reek Other Clinician: Referring Agapita Savarino: Fulton Reek Treating Karrington Studnicka/Extender: Tito Dine in Treatment: 13 Active  Problems Location of Pain Severity and Description of Pain Patient Has Paino No Site Locations Pain Management and Medication Current Pain Management: Electronic Signature(s) Signed: 08/16/2018 1:06:36 PM By: Harold Barban Entered By: Harold Barban on 08/16/2018 12:34:51 [name] ([id number]) -------------------------------------------------------------------------------- Wound Assessment Details Patient Name: [name] Date of Service: 08/16/2018 11:30 AM Medical Record Number: [id number] Patient Account Number: [id number] Date of Birth/Sex: 1931/03/22 (85 y.o. M) Treating RN: Montey Hora Primary Care Keyanna Sandefer: Fulton Reek Other Clinician: Referring Nada Godley: Fulton Reek Treating Leone Mobley/Extender: Tito Dine in Treatment: 13 Wound Status Wound Number: 1 Primary Lymphedema Etiology: Wound Location: Left Lower Leg - Lateral Wound Status: Open Wounding Event: Gradually Appeared Comorbid Lymphedema, Coronary Artery Disease, Date Acquired: 11/14/2017 History: Hypertension Weeks Of Treatment: 13 Clustered Wound: Yes Photos Wound Measurements Length: (cm) 0.7 Width: (cm) 0.9 Depth: (cm) 0.2 Area: (cm) 0.495 Volume: (cm) 0.099 % Reduction in Area: 84.2% % Reduction in Volume: 68.5% Epithelialization: None Tunneling: No Undermining: No Wound Description Full Thickness Without Exposed Support Classification: Structures Wound Margin: Flat and Intact Exudate Medium  Amount: Exudate Type: Serous Exudate Color: amber Foul Odor After Cleansing: No Slough/Fibrino Yes Wound Bed Granulation Amount: Large (67-100%) Exposed Structure Granulation Quality: Pink Fat Layer (Subcutaneous Tissue) Exposed: Yes Necrotic Amount: Small (1-33%) Necrotic Quality: Adherent Slough Periwound Skin Texture Texture Color No Abnormalities Noted: No No Abnormalities Noted: No WILLMAR, STOCKINGER ([id number]) Callus: No Atrophie Blanche:  No Crepitus: No Cyanosis: No Excoriation: No Ecchymosis: No Induration: No Erythema: No Rash: No Hemosiderin Staining: Yes Scarring: No Mottled: No Pallor: No Moisture Rubor: No No Abnormalities Noted: No Dry / Scaly: Yes Temperature / Pain Maceration: No Temperature: No Abnormality Electronic Signature(s) Signed: 08/16/2018 1:17:32 PM By: Montey Hora Entered By: Montey Hora on 08/16/2018 12:45:24 [name] ([id number]) -------------------------------------------------------------------------------- Vitals Details Patient Name: [name] Date of Service: 08/16/2018 11:30 AM Medical Record Number: [id number] Patient Account Number: [id number] Date of Birth/Sex: [birth year] (85 y.o. M) Treating RN: Harold Barban Primary Care Arn Mcomber: Fulton Reek Other Clinician: Referring Kelin Borum: Fulton Reek Treating Yazmyn Valbuena/Extender: Tito Dine in Treatment: 13 Vital Signs Time Taken: 12:35 Reference Range: 80 - 120 mg / dl Height (in): 71 Weight (lbs): 252 Body Mass Index (BMI): 35.1 Electronic Signature(s) Signed: 08/16/2018 1:06:36 PM By: Harold Barban Entered By: Harold Barban on 08/16/2018 12:36:27

## 2018-08-16 NOTE — Progress Notes (Signed)
KREIG, PARSON ([id number]) Visit Report for 08/16/2018 HPI Details Patient Name: Steven Meza, Steven Meza Date of Service: 08/16/2018 11:30 AM Medical Record Number: [id number] Patient Account Number: [id number] Date of Birth/Sex: May 27, 1930 (85 y.o. M) Treating RN: Cornell Barman Primary Care Provider: Fulton Reek Other Clinician: Referring Provider: Fulton Reek Treating Provider/Extender: Tito Dine in Treatment: 13 History of Present Illness HPI Description: ADMISSION 05/17/2018 [name] is an 85 year old man who is been treated at the lymphedema clinic for a long period with lymphedema wraps for bilateral lower extremity lymphedema. About 6 months ago they noted small open areas on his left lateral calf just above the ankle. These were open. They are apparently putting some form of ointment on this. They have been referred here for our review of this. The patient has a long history of lymphedema and venous stasis. It says in his records that he has recurrent cellulitis although his wife denies this but she states that the lymphedema may have started with cellulitis several years ago. Also in his record there is a history of bladder cancer which they seem to know very little about however he did not have any radiation to the pelvis or anything that could have contributed to lymphedema that they are aware of. There is no prior wound history. The patient has 2 small punched out areas on the left lateral calf that have adherent debris on the surface. Past medical history; lymphedema, hypertension, cellulitis, hearing loss, morbid obesity, osteoarthritis, venous stasis, bladder CA, diverticulosis. ABIs in our clinic were 0.36 on the right and 0.57 on the left 05/24/2018; corrected age on this patient is 49 versus what I said last week. He has been for his arterial studies which predictably are not very good. On the right his ABI is 0.65. Monophasic waveforms noted at the right ankle  including the posterior tibial and dorsalis pedis. He has triphasic waveforms of the common femoral and profunda femoris triphasic proximal SFA with monophasic distal FSA and popliteal artery. Monophasic tibial waveforms. On the left his ABI is 0.58 monophasic waveforms at the left ankle. Duplex of the left lower extremity demonstrated atherosclerotic change with monophasic waveforms throughout the left lower extremity. There was some concern about left iliac disease and potentially femoral-popliteal and tibial disease. The patient does not describe claudication although according to his wife he over emphasizes his activity. Patient states he is limited by pain in both knees His wounds are on the left lateral calf. 2 small areas with necrotic debris. We have been using silver collagen and light Ace wraps 05/31/2018; wounds on the left lateral calf in the setting of very significant lymphedema and probably PAD. We have been using silver collagen. The base of the small wound looks reasonably improved. I sent him to see Dr. Fletcher Anon however I see now he has an appointment with Dr. Alvester Chou on February 12 2/5; left lateral calf wound looks the same. His wife is doing a good job with her lymphedema wraps and maintaining that the edema. He most likely has PAD and is due to see Dr. Gwenlyn Found of infectious disease on February 12 2/19; left lateral calf wounds look about the same. This looks like wound secondary to chronic venous insufficiency with lymphedema however the patient has very poor arterial studies. We referred him to Dr. Gwenlyn Found. Dr. Gwenlyn Found did not feel he needed to do anything from an arterial point of view. He did not address the question about the aggressiveness of compression. After some thoughts about  this I elected to go ahead and put him in 3 layer compression which after all would be less compression then the lymphedema clinic was putting on this. I am hopeful that this should be enough to get  some closure of the small wounds 2/26; left lateral calf wound letter this week. He tolerated the 3 layer compression well furthermore he is sleeping in a hospital bed which helps keep his legs up at night. The wound looks better measuring smaller especially in width 3/3; left lateral calf wound about the same size. The 3 layer compression has really helped get the edema down in his leg Steven Meza, Steven A. ([id number]) however. Using silver collagen 3/11; left lateral calf wound better looking wound surface but about the same size. We have been using 3 layer compression which is really helped get the edema down in the left leg. Even after Dr. Kennon Holter assessment I am reluctant to go to 4 layer compression. He is tolerating 3 layer compression well. We have been using silver collagen 3/18-Patient returns for his left lateral calf wound which overall is looking better, slightly increased in size and dimensions and area, he is tolerating the 3 layer compression, has been dressed with silver collagen which we will continue 3/25oleft lateral calf wound much better looking. Size is smaller. He has been using silver collagen 4/1; gradually getting smaller in size. Using silver collagen with 3 layer compression. I think the patient has some degree of PAD. He saw Dr. Gwenlyn Found in consultation, he did not specifically comment on whether he could tolerate 4 layer compression 4/8; the wound is slightly smaller in depth. We are using 3 layer compression with silver collagen. I had him seen for arterial insufficiency however his ABI was 0.57 in the left leg. Dr. Gwenlyn Found really did not seem to middle on the degree of compression he could tolerate 4/15; the wound is slightly smaller and slightly less deep. We have been using 3 layer compression with silver collagen. There is not an option for 4-layer compression here. Electronic Signature(s) Signed: 08/16/2018 2:01:47 PM By: Linton Ham MD Entered By: Linton Ham  on 08/16/2018 13:09:31 Steven Meza ([id number]) -------------------------------------------------------------------------------- Physical Exam Details Patient Name: Steven Meza Date of Service: 08/16/2018 11:30 AM Medical Record Number: [id number] Patient Account Number: [id number] Date of Birth/Sex: 02-07-1931 (85 y.o. M) Treating RN: Cornell Barman Primary Care Provider: Fulton Reek Other Clinician: Referring Provider: Fulton Reek Treating Provider/Extender: Tito Dine in Treatment: 13 Eyes Conjunctivae clear. No discharge. Respiratory Respiratory effort is easy and symmetric bilaterally. Rate is normal at rest and on room air.. Cardiovascular Pedal pulses absent on the left. Nonpitting edema with changes of lymphedema however the edema control is quite good. Lymphatic None palpable in the popliteal or inguinal area. Integumentary (Hair, Skin) No evidence of surrounding infection. Notes Wound exam; left lateral calf. Perhaps slightly less depth and that he appears to be contracting in terms of surface area as well. Wound bed looks healthy. No debridement is required. Electronic Signature(s) Signed: 08/16/2018 2:01:47 PM By: Linton Ham MD Entered By: Linton Ham on 08/16/2018 13:10:42 Steven Meza ([id number]) -------------------------------------------------------------------------------- Physician Orders Details Patient Name: Steven Meza Date of Service: 08/16/2018 11:30 AM Medical Record Number: [id number] Patient Account Number: [id number] Date of Birth/Sex: [birth year] (85 y.o. M) Treating RN: Cornell Barman Primary Care Provider: Fulton Reek Other Clinician: Referring Provider: Fulton Reek Treating Provider/Extender: Tito Dine in Treatment: 49 Verbal / Phone Orders: No Diagnosis Coding Wound Cleansing  Wound #1 Left,Lateral Lower Leg o Clean wound with Normal Saline. Anesthetic (add to Medication List) Wound  #1 Left,Lateral Lower Leg o Topical Lidocaine 4% cream applied to wound bed prior to debridement (In Clinic Only). Primary Wound Dressing Wound #1 Left,Lateral Lower Leg o Silver Collagen Secondary Dressing Wound #1 Left,Lateral Lower Leg o ABD pad Dressing Change Frequency Wound #1 Left,Lateral Lower Leg o Change dressing every week o Other: - as needed Follow-up Appointments Wound #1 Left,Lateral Lower Leg o Return Appointment in 1 week. o Nurse Visit as needed - as needed Edema Control Wound #1 Left,Lateral Lower Leg o 3 Layer Compression System - Left Lower Extremity Electronic Signature(s) Signed: 08/16/2018 2:01:47 PM By: Linton Ham MD Signed: 08/16/2018 3:17:52 PM By: Gretta Cool, BSN, RN, CWS, Kim RN, BSN Entered By: Gretta Cool, BSN, RN, CWS, Kim on 08/16/2018 13:01:49 Steven Meza ([id number]) -------------------------------------------------------------------------------- Problem List Details Patient Name: Steven Meza Date of Service: 08/16/2018 11:30 AM Medical Record Number: [id number] Patient Account Number: [id number] Date of Birth/Sex: 01/17/1931 (85 y.o. 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 with other specified 05/17/2018 No Yes severity I89.0 Lymphedema, not elsewhere classified 05/17/2018 No Yes I70.212 Atherosclerosis of native arteries of extremities with 05/17/2018 No Yes intermittent claudication, left leg Inactive Problems Resolved Problems Electronic Signature(s) Signed: 08/16/2018 2:01:47 PM By: Linton Ham MD Entered By: Linton Ham on 08/16/2018 13:03:51 Steven Meza  ([id number]) -------------------------------------------------------------------------------- Progress Note Details Patient Name: Steven Meza Date of Service: 08/16/2018 11:30 AM Medical Record Number: [id number] Patient Account Number: [id number] Date of Birth/Sex: 1930/07/29 (85 y.o. M) Treating RN: Cornell Barman Primary Care Provider: Fulton Reek Other Clinician: Referring Provider: Fulton Reek Treating Provider/Extender: Tito Dine in Treatment: 13 Subjective History of Present Illness (HPI) ADMISSION 05/17/2018 Mr. Ellinwood is an 85 year old man who is been treated at the lymphedema clinic for a long period with lymphedema wraps for bilateral lower extremity lymphedema. About 6 months ago they noted small open areas on his left lateral calf just above the ankle. These were open. They are apparently putting some form of ointment on this. They have been referred here for our review of this. The patient has a long history of lymphedema and venous stasis. It says in his records that he has recurrent cellulitis although his wife denies this but she states that the lymphedema may have started with cellulitis several years ago. Also in his record there is a history of bladder cancer which they seem to know very little about however he did not have any radiation to the pelvis or anything that could have contributed to lymphedema that they are aware of. There is no prior wound history. The patient has 2 small punched out areas on the left lateral calf that have adherent debris on the surface. Past medical history; lymphedema, hypertension, cellulitis, hearing loss, morbid obesity, osteoarthritis, venous stasis, bladder CA, diverticulosis. ABIs in our clinic were 0.36 on the right and 0.57 on the left 05/24/2018; corrected age on this patient is 73 versus what I said last week. He has been for his arterial studies which predictably are not very good. On the right his ABI is  0.65. Monophasic waveforms noted at the right ankle including the posterior tibial and dorsalis pedis. He has triphasic waveforms of the common  femoral and profunda femoris triphasic proximal SFA with monophasic distal FSA and popliteal artery. Monophasic tibial waveforms. On the left his ABI is 0.58 monophasic waveforms at the left ankle. Duplex of the left lower extremity demonstrated atherosclerotic change with monophasic waveforms throughout the left lower extremity. There was some concern about left iliac disease and potentially femoral-popliteal and tibial disease. The patient does not describe claudication although according to his wife he over emphasizes his activity. Patient states he is limited by pain in both knees His wounds are on the left lateral calf. 2 small areas with necrotic debris. We have been using silver collagen and light Ace wraps 05/31/2018; wounds on the left lateral calf in the setting of very significant lymphedema and probably PAD. We have been using silver collagen. The base of the small wound looks reasonably improved. I sent him to see Dr. Fletcher Anon however I see now he has an appointment with Dr. Alvester Chou on February 12 2/5; left lateral calf wound looks the same. His wife is doing a good job with her lymphedema wraps and maintaining that the edema. He most likely has PAD and is due to see Dr. Gwenlyn Found of infectious disease on February 12 2/19; left lateral calf wounds look about the same. This looks like wound secondary to chronic venous insufficiency with lymphedema however the patient has very poor arterial studies. We referred him to Dr. Gwenlyn Found. Dr. Gwenlyn Found did not feel he needed to do anything from an arterial point of view. He did not address the question about the aggressiveness of compression. After some thoughts about this I elected to go ahead and put him in 3 layer compression which after all would be less compression then the lymphedema clinic was putting on this. I  am hopeful that this should be enough to get some closure of the small wounds 2/26; left lateral calf wound letter this week. He tolerated the 3 layer compression well furthermore he is sleeping in a hospital bed which helps keep his legs up at night. The wound looks better measuring smaller especially in width 3/3; left lateral calf wound about the same size. The 3 layer compression has really helped get the edema down in his leg however. Using silver collagen 3/11; left lateral calf wound better looking wound surface but about the same size. We have been using 3 layer compression which is really helped get the edema down in the left leg. Even after Dr. Kennon Holter assessment I am reluctant to go to 4 layer Steven Meza, Steven A. ([id number]) compression. He is tolerating 3 layer compression well. We have been using silver collagen 3/18-Patient returns for his left lateral calf wound which overall is looking better, slightly increased in size and dimensions and area, he is tolerating the 3 layer compression, has been dressed with silver collagen which we will continue 3/25 left lateral calf wound much better looking. Size is smaller. He has been using silver collagen 4/1; gradually getting smaller in size. Using silver collagen with 3 layer compression. I think the patient has some degree of PAD. He saw Dr. Gwenlyn Found in consultation, he did not specifically comment on whether he could tolerate 4 layer compression 4/8; the wound is slightly smaller in depth. We are using 3 layer compression with silver collagen. I had him seen for arterial insufficiency however his ABI was 0.57 in the left leg. Dr. Gwenlyn Found really did not seem to middle on the degree of compression he could tolerate 4/15; the wound is slightly smaller and slightly  less deep. We have been using 3 layer compression with silver collagen. There is not an option for 4-layer compression here. Objective Constitutional Vitals Time Taken: 12:35 PM,  Height: 71 in, Weight: 252 lbs, BMI: 35.1. Eyes Conjunctivae clear. No discharge. Respiratory Respiratory effort is easy and symmetric bilaterally. Rate is normal at rest and on room air.. Cardiovascular Pedal pulses absent on the left. Nonpitting edema with changes of lymphedema however the edema control is quite good. Lymphatic None palpable in the popliteal or inguinal area. General Notes: Wound exam; left lateral calf. Perhaps slightly less depth and that he appears to be contracting in terms of surface area as well. Wound bed looks healthy. No debridement is required. Integumentary (Hair, Skin) No evidence of surrounding infection. Wound #1 status is Open. Original cause of wound was Gradually Appeared. The wound is located on the Left,Lateral Lower Leg. The wound measures 0.7cm length x 0.9cm width x 0.2cm depth; 0.495cm^2 area and 0.099cm^3 volume. There is Fat Layer (Subcutaneous Tissue) Exposed exposed. There is no tunneling or undermining noted. There is a medium amount of serous drainage noted. The wound margin is flat and intact. There is large (67-100%) pink granulation within the wound bed. There is a small (1-33%) amount of necrotic tissue within the wound bed including Adherent Slough. The periwound skin appearance exhibited: Dry/Scaly, Hemosiderin Staining. The periwound skin appearance did not exhibit: Callus, Crepitus, Excoriation, Induration, Rash, Scarring, Maceration, Atrophie Blanche, Cyanosis, Ecchymosis, Mottled, Pallor, Rubor, Erythema. Periwound temperature was noted as No Abnormality. Steven Meza, Steven Meza ([id number]) Assessment Active Problems ICD-10 Non-pressure chronic ulcer of left calf with other specified severity Lymphedema, not elsewhere classified Atherosclerosis of native arteries of extremities with intermittent claudication, left leg Procedures Wound #1 Pre-procedure diagnosis of Wound #1 is a Lymphedema located on the Left,Lateral Lower Leg . There  was a Three Layer Compression Therapy Procedure with a pre-treatment ABI of 0.6 by Cornell Barman, RN. Post procedure Diagnosis Wound #1: Same as Pre-Procedure Plan Wound Cleansing: Wound #1 Left,Lateral Lower Leg: Clean wound with Normal Saline. Anesthetic (add to Medication List): Wound #1 Left,Lateral Lower Leg: Topical Lidocaine 4% cream applied to wound bed prior to debridement (In Clinic Only). Primary Wound Dressing: Wound #1 Left,Lateral Lower Leg: Silver Collagen Secondary Dressing: Wound #1 Left,Lateral Lower Leg: ABD pad Dressing Change Frequency: Wound #1 Left,Lateral Lower Leg: Change dressing every week Other: - as needed Follow-up Appointments: Wound #1 Left,Lateral Lower Leg: Return Appointment in 1 week. Nurse Visit as needed - as needed Edema Control: Wound #1 Left,Lateral Lower Leg: 3 Layer Compression System - Left Lower Extremity 1. Left lateral lower leg continue silver collagen under 3 layer compression 2. Because of PAD I do not think this patient is a candidate for 4-layer compression Steven Meza, Steven Meza ([id number]) 3. He did get stockings which she is wearing on the right leg however I do not think that he should transition to a stocking on the left leg until the wound is healed Electronic Signature(s) Signed: 08/16/2018 2:01:47 PM By: Linton Ham MD Entered By: Linton Ham on 08/16/2018 13:11:24 Steven Meza ([id number]) -------------------------------------------------------------------------------- SuperBill Details Patient Name: Steven Meza Date of Service: 08/16/2018 Medical Record Number: [id number] Patient Account Number: [id number] Date of Birth/Sex: 1930/05/31 (85 y.o. M) Treating RN: Cornell Barman Primary Care Provider: Fulton Reek Other Clinician: Referring Provider: Fulton Reek Treating Provider/Extender: Tito Dine in Treatment: 13 Diagnosis Coding ICD-10 Codes Meza Description [phone] Non-pressure  chronic ulcer of left calf with other specified severity  I89.0 Lymphedema, not elsewhere classified I70.212 Atherosclerosis of native arteries of extremities with intermittent claudication, left leg Facility Procedures CPT4 Meza: [id number] Description: (Facility Use Only) [phone] - APPLY MULTLAY COMPRS LWR LT LEG Modifier: Quantity: 1 Physician Procedures CPT4 Meza Description: [id number] - WC PHYS LEVEL 3 - EST PT ICD-10 Diagnosis Description 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

## 2018-08-23 ENCOUNTER — Encounter: Payer: Medicare Other | Admitting: Internal Medicine

## 2018-08-23 ENCOUNTER — Other Ambulatory Visit: Payer: Self-pay

## 2018-08-23 DIAGNOSIS — L97228 Non-pressure chronic ulcer of left calf with other specified severity: Secondary | ICD-10-CM | POA: Diagnosis not present

## 2018-08-23 NOTE — Progress Notes (Signed)
DEJUAN, ELMAN ([id number]) Visit Report for 08/23/2018 HPI Details Patient Name: Steven Meza, Steven Meza Date of Service: 08/23/2018 1:30 PM Medical Record Number: [id number] Patient Account Number: [id number] Date of Birth/Sex: [birth year] (85 y.o. M) Treating RN: Cornell Barman Primary Care Provider: Fulton Reek Other Clinician: Referring Provider: Fulton Reek Treating Provider/Extender: Tito Dine in Treatment: 14 History of Present Illness HPI Description: ADMISSION 05/17/2018 Mr. Maret is an 85 year old man who is been treated at the lymphedema clinic for a long period with lymphedema wraps for bilateral lower extremity lymphedema. About 6 months ago they noted small open areas on his left lateral calf just above the ankle. These were open. They are apparently putting some form of ointment on this. They have been referred here for our review of this. The patient has a long history of lymphedema and venous stasis. It says in his records that he has recurrent cellulitis although his wife denies this but she states that the lymphedema may have started with cellulitis several years ago. Also in his record there is a history of bladder cancer which they seem to know very little about however he did not have any radiation to the pelvis or anything that could have contributed to lymphedema that they are aware of. There is no prior wound history. The patient has 2 small punched out areas on the left lateral calf that have adherent debris on the surface. Past medical history; lymphedema, hypertension, cellulitis, hearing loss, morbid obesity, osteoarthritis, venous stasis, bladder CA, diverticulosis. ABIs in our clinic were 0.36 on the right and 0.57 on the left 05/24/2018; corrected age on this patient is 21 versus what I said last week. He has been for his arterial studies which predictably are not very good. On the right his ABI is 0.65. Monophasic waveforms noted at the right ankle  including the posterior tibial and dorsalis pedis. He has triphasic waveforms of the common femoral and profunda femoris triphasic proximal SFA with monophasic distal FSA and popliteal artery. Monophasic tibial waveforms. On the left his ABI is 0.58 monophasic waveforms at the left ankle. Duplex of the left lower extremity demonstrated atherosclerotic change with monophasic waveforms throughout the left lower extremity. There was some concern about left iliac disease and potentially femoral-popliteal and tibial disease. The patient does not describe claudication although according to his wife he over emphasizes his activity. Patient states he is limited by pain in both knees His wounds are on the left lateral calf. 2 small areas with necrotic debris. We have been using silver collagen and light Ace wraps 05/31/2018; wounds on the left lateral calf in the setting of very significant lymphedema and probably PAD. We have been using silver collagen. The base of the small wound looks reasonably improved. I sent him to see Dr. Fletcher Anon however I see now he has an appointment with Dr. Alvester Chou on February 12 2/5; left lateral calf wound looks the same. His wife is doing a good job with her lymphedema wraps and maintaining that the edema. He most likely has PAD and is due to see Dr. Gwenlyn Found of infectious disease on February 12 2/19; left lateral calf wounds look about the same. This looks like wound secondary to chronic venous insufficiency with lymphedema however the patient has very poor arterial studies. We referred him to Dr. Gwenlyn Found. Dr. Gwenlyn Found did not feel he needed to do anything from an arterial point of view. He did not address the question about the aggressiveness of compression. After some thoughts about  this I elected to go ahead and put him in 3 layer compression which after all would be less compression then the lymphedema clinic was putting on this. I am hopeful that this should be enough to get  some closure of the small wounds 2/26; left lateral calf wound letter this week. He tolerated the 3 layer compression well furthermore he is sleeping in a hospital bed which helps keep his legs up at night. The wound looks better measuring smaller especially in width 3/3; left lateral calf wound about the same size. The 3 layer compression has really helped get the edema down in his leg LELDON, STEEGE A. ([id number]) however. Using silver collagen 3/11; left lateral calf wound better looking wound surface but about the same size. We have been using 3 layer compression which is really helped get the edema down in the left leg. Even after Dr. Kennon Holter assessment I am reluctant to go to 4 layer compression. He is tolerating 3 layer compression well. We have been using silver collagen 3/18-Patient returns for his left lateral calf wound which overall is looking better, slightly increased in size and dimensions and area, he is tolerating the 3 layer compression, has been dressed with silver collagen which we will continue 3/25oleft lateral calf wound much better looking. Size is smaller. He has been using silver collagen 4/1; gradually getting smaller in size. Using silver collagen with 3 layer compression. I think the patient has some degree of PAD. He saw Dr. Gwenlyn Found in consultation, he did not specifically comment on whether he could tolerate 4 layer compression 4/8; the wound is slightly smaller in depth. We are using 3 layer compression with silver collagen. I had him seen for arterial insufficiency however his ABI was 0.57 in the left leg. Dr. Gwenlyn Found really did not seem to middle on the degree of compression he could tolerate 4/15; the wound is slightly smaller and slightly less deep. We have been using 3 layer compression with silver collagen. There is not an option for 4-layer compression here. 4/22; the patient is making decent progress on his wound on the left lateral calf however he arrives in  with a superficial wound on the right anterior tibial area. Were not really sure how this happened. He had been using a stocking on the right leg. He has been using silver collagen on the wounds Amazing to me he is he appears to have compression pumps at home. I am not really sure I knew this before. In any case he has not been using them Electronic Signature(s) Signed: 08/23/2018 3:44:07 PM By: Linton Ham MD Entered By: Linton Ham on 08/23/2018 14:27:20 [name] ([id number]) -------------------------------------------------------------------------------- Physical Exam Details Patient Name: [name] Date of Service: 08/23/2018 1:30 PM Medical Record Number: [id number] Patient Account Number: [id number] Date of Birth/Sex: October 19, 1930 (85 y.o. M) Treating RN: Cornell Barman Primary Care Provider: Fulton Reek Other Clinician: Referring Provider: Fulton Reek Treating Provider/Extender: Tito Dine in Treatment: 14 Constitutional Sitting or standing Blood Pressure is within target range for patient.. Pulse regular and within target range for patient.Marland Kitchen Respirations regular, non-labored and within target range.. Temperature is normal and within the target range for the patient.Marland Kitchen appears in no distress. Eyes Conjunctivae clear. No discharge. Respiratory Respiratory effort is easy and symmetric bilaterally. Rate is normal at rest and on room air.. Cardiovascular Very faint dorsalis pedis pulses. Lymphatic None palpable in the popliteal area bilaterally. Integumentary (Hair, Skin) Chronic venous insufficiency with lymphedema. Notes Wound exam  oOn the left lateral calf we are making progress this seems to be epithelializing slowly. oHe has a new wound on the right anterior tibial area. This has no depth but will definitely need dressing and compression Electronic Signature(s) Signed: 08/23/2018 3:44:07 PM By: Linton Ham MD Entered By: Linton Ham on 08/23/2018 14:26:02 [name] ([id number]) -------------------------------------------------------------------------------- Physician Orders Details Patient Name: [name] Date of Service: 08/23/2018 1:30 PM Medical Record Number: [id number] Patient Account Number: [id number] Date of Birth/Sex: [birth year] (85 y.o. M) Treating RN: Cornell Barman Primary Care Provider: Fulton Reek Other Clinician: Referring Provider: Fulton Reek Treating Provider/Extender: Tito Dine in Treatment: 91 Verbal / Phone Orders: No Diagnosis Coding Wound Cleansing Wound #1 Left,Lateral Lower Leg o Clean wound with Normal Saline. Wound #2 Right,Anterior Lower Leg o Clean wound with Normal Saline. Anesthetic (add to Medication List) Wound #1 Left,Lateral Lower Leg o Topical Lidocaine 4% cream applied to wound bed prior to debridement (In Clinic Only). Wound #2 Right,Anterior Lower Leg o Topical Lidocaine 4% cream applied to wound bed prior to debridement (In Clinic Only). Primary Wound Dressing Wound #1 Left,Lateral Lower Leg o Silver Collagen Wound #2 Right,Anterior Lower Leg o Silver Collagen Secondary Dressing Wound #1 Left,Lateral Lower Leg o ABD pad Wound #2 Right,Anterior Lower Leg o ABD pad Dressing Change Frequency Wound #1 Left,Lateral Lower Leg o Change dressing every week o Other: - as needed Wound #2 Right,Anterior Lower Leg o Change dressing every week o Other: - as needed Follow-up Appointments Wound #1 Left,Lateral Lower Leg o Return Appointment in 1 week. o Nurse Visit as needed - as needed Wound #2 Right,Anterior Lower Leg DINA, MOBLEY ([id number]) o Return Appointment in 1 week. o Nurse Visit as needed - as needed Edema Control Wound #1 Left,Lateral Lower Leg o 3 Layer Compression System - Left Lower Extremity o Compression Pump: Use compression pump on left lower extremity for 30 minutes,  twice daily. Wound #2 Right,Anterior Lower Leg o Kerlix and Coban - Right Lower Extremity o Compression Pump: Use compression pump on right lower extremity for 30 minutes, twice daily. Electronic Signature(s) Signed: 08/23/2018 3:44:07 PM By: Linton Ham MD Signed: 08/23/2018 4:31:15 PM By: Gretta Cool, BSN, RN, CWS, Kim RN, BSN Entered By: Gretta Cool, BSN, RN, CWS, Kim on 08/23/2018 14:10:03 [name] ([id number]) -------------------------------------------------------------------------------- Problem List Details Patient Name: [name] Date of Service: 08/23/2018 1:30 PM Medical Record Number: [id number] Patient Account Number: [id number] Date of Birth/Sex: 1930/10/20 (86 y.o. 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 with other specified 05/17/2018 No Yes severity I89.0 Lymphedema, not elsewhere classified 05/17/2018 No Yes I70.212 Atherosclerosis of native arteries of extremities with 05/17/2018 No Yes intermittent claudication, left leg L97.211 Non-pressure chronic ulcer of right calf limited to breakdown 08/23/2018 No Yes of skin Inactive Problems Resolved Problems Electronic Signature(s) Signed: 08/23/2018 3:44:07 PM By: Linton Ham MD Entered By: Linton Ham on 08/23/2018 14:22:18 [name] ([id number]) -------------------------------------------------------------------------------- Progress Note Details Patient Name: [name] Date of Service: 08/23/2018 1:30 PM Medical Record Number: [id number] Patient Account Number: [id number] Date of Birth/Sex: [birth year] (85 y.o. M) Treating RN: Cornell Barman Primary Care Provider: Fulton Reek Other Clinician: Referring  Provider: Fulton Reek Treating Provider/Extender: Tito Dine in Treatment: 14 Subjective History of Present  Illness (HPI) ADMISSION 05/17/2018 Mr. Groleau is an 85 year old man who is been treated at the lymphedema clinic for a long period with lymphedema wraps for bilateral lower extremity lymphedema. About 6 months ago they noted small open areas on his left lateral calf just above the ankle. These were open. They are apparently putting some form of ointment on this. They have been referred here for our review of this. The patient has a long history of lymphedema and venous stasis. It says in his records that he has recurrent cellulitis although his wife denies this but she states that the lymphedema may have started with cellulitis several years ago. Also in his record there is a history of bladder cancer which they seem to know very little about however he did not have any radiation to the pelvis or anything that could have contributed to lymphedema that they are aware of. There is no prior wound history. The patient has 2 small punched out areas on the left lateral calf that have adherent debris on the surface. Past medical history; lymphedema, hypertension, cellulitis, hearing loss, morbid obesity, osteoarthritis, venous stasis, bladder CA, diverticulosis. ABIs in our clinic were 0.36 on the right and 0.57 on the left 05/24/2018; corrected age on this patient is 33 versus what I said last week. He has been for his arterial studies which predictably are not very good. On the right his ABI is 0.65. Monophasic waveforms noted at the right ankle including the posterior tibial and dorsalis pedis. He has triphasic waveforms of the common femoral and profunda femoris triphasic proximal SFA with monophasic distal FSA and popliteal artery. Monophasic tibial waveforms. On the left his ABI is 0.58 monophasic waveforms at the left ankle. Duplex of the left lower extremity demonstrated  atherosclerotic change with monophasic waveforms throughout the left lower extremity. There was some concern about left iliac disease and potentially femoral-popliteal and tibial disease. The patient does not describe claudication although according to his wife he over emphasizes his activity. Patient states he is limited by pain in both knees His wounds are on the left lateral calf. 2 small areas with necrotic debris. We have been using silver collagen and light Ace wraps 05/31/2018; wounds on the left lateral calf in the setting of very significant lymphedema and probably PAD. We have been using silver collagen. The base of the small wound looks reasonably improved. I sent him to see Dr. Fletcher Anon however I see now he has an appointment with Dr. Alvester Chou on February 12 2/5; left lateral calf wound looks the same. His wife is doing a good job with her lymphedema wraps and maintaining that the edema. He most likely has PAD and is due to see Dr. Gwenlyn Found of infectious disease on February 12 2/19; left lateral calf wounds look about the same. This looks like wound secondary to chronic venous insufficiency with lymphedema however the patient has very poor arterial studies. We referred him to Dr. Gwenlyn Found. Dr. Gwenlyn Found did not feel he needed to do anything from an arterial point of view. He did not address the question about the aggressiveness of compression. After some thoughts about this I elected to go ahead and put him in 3 layer compression which after all would be less compression then the lymphedema clinic was putting on this. I am hopeful that this should be enough to get some closure of the small wounds 2/26; left lateral calf wound letter this week. He tolerated the 3 layer compression well furthermore he is sleeping in  a hospital bed which helps keep his legs up at night. The wound looks better measuring smaller especially in width 3/3; left lateral calf wound about the same size. The 3 layer compression  has really helped get the edema down in his leg however. Using silver collagen 3/11; left lateral calf wound better looking wound surface but about the same size. We have been using 3 layer compression which is really helped get the edema down in the left leg. Even after Dr. Kennon Holter assessment I am reluctant to go to 4 layer LUMIR, DEMETRIOU A. ([id number]) compression. He is tolerating 3 layer compression well. We have been using silver collagen 3/18-Patient returns for his left lateral calf wound which overall is looking better, slightly increased in size and dimensions and area, he is tolerating the 3 layer compression, has been dressed with silver collagen which we will continue 3/25 left lateral calf wound much better looking. Size is smaller. He has been using silver collagen 4/1; gradually getting smaller in size. Using silver collagen with 3 layer compression. I think the patient has some degree of PAD. He saw Dr. Gwenlyn Found in consultation, he did not specifically comment on whether he could tolerate 4 layer compression 4/8; the wound is slightly smaller in depth. We are using 3 layer compression with silver collagen. I had him seen for arterial insufficiency however his ABI was 0.57 in the left leg. Dr. Gwenlyn Found really did not seem to middle on the degree of compression he could tolerate 4/15; the wound is slightly smaller and slightly less deep. We have been using 3 layer compression with silver collagen. There is not an option for 4-layer compression here. 4/22; the patient is making decent progress on his wound on the left lateral calf however he arrives in with a superficial wound on the right anterior tibial area. Were not really sure how this happened. He had been using a stocking on the right leg. He has been using silver collagen on the wounds Amazing to me he is he appears to have compression pumps at home. I am not really sure I knew this before. In any case he has not been using  them Objective Constitutional Sitting or standing Blood Pressure is within target range for patient.. Pulse regular and within target range for patient.Marland Kitchen Respirations regular, non-labored and within target range.. Temperature is normal and within the target range for the patient.Marland Kitchen appears in no distress. Vitals Time Taken: 1:35 PM, Height: 71 in, Weight: 252 lbs, BMI: 35.1, Temperature: 97.6 F, Pulse: 74 bpm, Respiratory Rate: 18 breaths/min, Blood Pressure: 138/62 mmHg. Eyes Conjunctivae clear. No discharge. Respiratory Respiratory effort is easy and symmetric bilaterally. Rate is normal at rest and on room air.. Cardiovascular Very faint dorsalis pedis pulses. Lymphatic None palpable in the popliteal area bilaterally. General Notes: Wound exam On the left lateral calf we are making progress this seems to be epithelializing slowly. He has a new wound on the right anterior tibial area. This has no depth but will definitely need dressing and compression Integumentary (Hair, Skin) Chronic venous insufficiency with lymphedema. Wound #1 status is Open. Original cause of wound was Gradually Appeared. The wound is located on the Left,Lateral Lower Leg. The wound measures 0.5cm length x 0.4cm width x 0.1cm depth; 0.157cm^2 area and 0.016cm^3 volume. There is Fat Layer (Subcutaneous Tissue) Exposed exposed. There is no tunneling or undermining noted. There is a medium amount of ALEKZANDER, CARDELL A. ([id number]) serous drainage noted. The wound margin is flat and intact.  There is large (67-100%) pink granulation within the wound bed. There is a small (1-33%) amount of necrotic tissue within the wound bed including Adherent Slough. The periwound skin appearance exhibited: Dry/Scaly, Hemosiderin Staining. The periwound skin appearance did not exhibit: Callus, Crepitus, Excoriation, Induration, Rash, Scarring, Maceration, Atrophie Blanche, Cyanosis, Ecchymosis, Mottled, Pallor, Rubor, Erythema.  Periwound temperature was noted as No Abnormality. Wound #2 status is Open. Original cause of wound was Gradually Appeared. The wound is located on the Right,Anterior Lower Leg. The wound measures 1.3cm length x 1.2cm width x 0.1cm depth; 1.225cm^2 area and 0.123cm^3 volume. There is Fat Layer (Subcutaneous Tissue) Exposed exposed. There is no tunneling or undermining noted. There is a medium amount of serous drainage noted. The wound margin is flat and intact. There is medium (34-66%) pink granulation within the wound bed. There is a medium (34-66%) amount of necrotic tissue within the wound bed including Adherent Slough. The periwound skin appearance exhibited: Hemosiderin Staining. The periwound skin appearance did not exhibit: Callus, Crepitus, Excoriation, Induration, Rash, Scarring, Dry/Scaly, Maceration, Atrophie Blanche, Cyanosis, Ecchymosis, Mottled, Pallor, Rubor, Erythema. Periwound temperature was noted as No Abnormality. 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 Wound #2 Right,Anterior Lower Leg: Clean wound with Normal Saline. Anesthetic (add to Medication List): Wound #1 Left,Lateral Lower Leg: Topical Lidocaine 4% cream applied to wound bed prior to debridement (In Clinic Only). Wound #2 Right,Anterior Lower Leg: Topical Lidocaine 4% cream applied to wound bed prior to debridement (In Clinic  Only). Primary Wound Dressing: Wound #1 Left,Lateral Lower Leg: Silver Collagen Wound #2 Right,Anterior Lower Leg: Silver Collagen Secondary Dressing: Wound #1 Left,Lateral Lower Leg: ERCIL, CASSIS ([id number]) ABD pad Wound #2 Right,Anterior Lower Leg: ABD pad Dressing Change Frequency: Wound #1 Left,Lateral Lower Leg: Change dressing every week Other: - as needed Wound #2 Right,Anterior Lower Leg: Change dressing every week Other: - as needed Follow-up Appointments: Wound #1 Left,Lateral Lower Leg: Return Appointment in 1 week. Nurse Visit as needed - as needed Wound #2 Right,Anterior Lower Leg: Return Appointment in 1 week. Nurse Visit as needed - as needed Edema Control: Wound #1 Left,Lateral Lower Leg: 3 Layer Compression System - Left Lower Extremity Compression Pump: Use compression pump on left lower extremity for 30 minutes, twice daily. Wound #2 Right,Anterior Lower Leg: Kerlix and Coban - Right Lower Extremity Compression Pump: Use compression pump on right lower extremity for 30 minutes, twice daily. 1. We are using silver collagen on the left leg with gradual improvement 2. We have been using 3 layer compression on the left leg which he seems to tolerate in spite of his known PAD 3. With regards to the new wound on the right anterior tibial area we will put silver collagen on this as well. Kerlix Coban and as his ABI in the right leg was 0.4 versus 0.6 on the left 4. With regards to his pumps I have told him he can use these over the dressings including the wraps as long as this does not cause pain. He promised me to start doing this once  a day Electronic Signature(s) Signed: 08/23/2018 3:44:07 PM By: Linton Ham MD Entered By: Linton Ham on 08/23/2018 14:28:43 [name] ([id number]) -------------------------------------------------------------------------------- SuperBill Details Patient Name: [name] Date of Service:  08/23/2018 Medical Record Number: [id number] Patient Account Number: [id number] Date of Birth/Sex: 1930/06/04 (85 y.o. 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 claudication, left leg Facility Procedures CPT4 Code: [id number] Description: (Facility Use Only) [phone] - [place] COMPRS LWR LT LEG Modifier: Quantity: 1 Physician Procedures CPT4 Code Description: [id number] - WC PHYS LEVEL 3 - EST PT ICD-10 Diagnosis Description L97.228 Non-pressure chronic ulcer of left calf with other specified seve I89.0 Lymphedema, not elsewhere classified I70.212 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

## 2018-08-28 NOTE — Progress Notes (Signed)
HABEEB, PUERTAS ([id number]) Visit Report for 08/23/2018 Arrival Information Details Patient Name: DENI, LEFEVER Date of Service: 08/23/2018 1:30 PM Medical Record Number: [id number] Patient Account Number: [id number] Date of Birth/Sex: 1930-09-12 (85 y.o. M) Treating RN: Montey Hora Primary Care Merrick Feutz: Fulton Reek Other Clinician: Referring Denzil Mceachron: Fulton Reek Treating Nakeya Adinolfi/Extender: Tito Dine in Treatment: 14 Visit Information History Since Last Visit Added or deleted any medications: No Patient Arrived: Cane Any new allergies or adverse reactions: No Arrival Time: 13:35 Had a fall or experienced change in No Accompanied By: self activities of daily living that may affect Transfer Assistance: None risk of falls: Patient Identification Verified: Yes Signs or symptoms of abuse/neglect since last visito No Secondary Verification Process Completed: Yes Hospitalized since last visit: No Implantable device outside of the clinic excluding No cellular tissue based products placed in the center since last visit: Has Dressing in Place as Prescribed: Yes Has Compression in Place as Prescribed: Yes Pain Present Now: No Electronic Signature(s) Signed: 08/23/2018 2:28:39 PM By: Montey Hora Entered By: Montey Hora on 08/23/2018 13:35:20 [name] ([id number]) -------------------------------------------------------------------------------- Encounter Discharge Information Details Patient Name: [name] Date of Service: 08/23/2018 1:30 PM Medical Record Number: [id number] Patient Account Number: [id number] Date of Birth/Sex: 1930/10/17 (85 y.o. M) Treating RN: Harold Barban Primary Care Deriona Altemose: Fulton Reek Other Clinician: Referring Jahad Old: Fulton Reek Treating Paiden Caraveo/Extender: Tito Dine in Treatment: 14 Encounter Discharge Information Items Discharge Condition: Stable Ambulatory Status: Cane Discharge  Destination: Home Transportation: Private Auto Accompanied By: self Schedule Follow-up Appointment: Yes Clinical Summary of Care: Electronic Signature(s) Signed: 08/28/2018 4:10:42 PM By: Harold Barban Entered By: Harold Barban on 08/23/2018 14:22:37 [name] ([id number]) -------------------------------------------------------------------------------- Lower Extremity Assessment Details Patient Name: [name] Date of Service: 08/23/2018 1:30 PM Medical Record Number: [id number] Patient Account Number: [id number] Date of Birth/Sex: 09/03/1930 (85 y.o. M) Treating RN: Montey Hora Primary Care Karl Knarr: Fulton Reek Other Clinician: Referring Briane Birden: Fulton Reek Treating Nani Ingram/Extender: Tito Dine in Treatment: 14 Edema Assessment Assessed: [Left: No] [Right: No] Edema: [Left: Yes] [Right: Yes] Calf Left: Right: Point of Measurement: 32 cm From Medial Instep 44 cm 44 cm Ankle Left: Right: Point of Measurement: 13 cm From Medial Instep 28.3 cm 28.5 cm Vascular Assessment Pulses: Dorsalis Pedis Palpable: [Left:Yes] [Right:Yes] Electronic Signature(s) Signed: 08/23/2018 2:28:39 PM By: Montey Hora Entered By: Montey Hora on 08/23/2018 13:47:22 [name] ([id number]) -------------------------------------------------------------------------------- Multi Wound Chart Details Patient Name: [name] Date of Service: 08/23/2018 1:30 PM Medical Record Number: [id number] Patient Account Number: [id number] Date of Birth/Sex: March 23, 1931 (85 y.o. M) Treating RN: Cornell Barman Primary Care Shavone Nevers: Fulton Reek Other Clinician: Referring Vihaan Gloss: Fulton Reek Treating Tariq Pernell/Extender: Tito Dine in Treatment: 14 Vital Signs Height(in): 71 Pulse(bpm): 72 Weight(lbs): 252 Blood Pressure(mmHg): 138/62 Body Mass Index(BMI): 35 Temperature(F): 97.6 Respiratory Rate 18 (breaths/min): Photos:  [N/A:N/A] Wound Location: Left Lower Leg - Lateral Right Lower Leg - Anterior N/A Wounding Event: Gradually Appeared Gradually Appeared N/A Primary Etiology: Lymphedema Lymphedema N/A Comorbid History: Lymphedema, Coronary Artery Lymphedema, Coronary Artery N/A Disease, Hypertension Disease, Hypertension Date Acquired: 11/14/2017 08/21/2018 N/A Weeks of Treatment: 14 0 N/A Wound Status: Open Open N/A Clustered Wound: Yes No N/A Measurements L x W x D 0.5x0.4x0.1 1.3x1.2x0.1 N/A (cm) Area (cm) : 0.157 1.225 N/A Volume (cm) : 0.016 0.123 N/A % Reduction in Area: 95.00% N/A N/A % Reduction in Volume: 94.90% N/A N/A Classification: Full Thickness Without Full Thickness Without N/A  Exposed Support Structures Exposed Support Structures Exudate Amount: Medium Medium N/A Exudate Type: Serous Serous N/A Exudate Color: amber amber N/A Wound Margin: Flat and Intact Flat and Intact N/A Granulation Amount: Large (67-100%) Medium (34-66%) N/A Granulation Quality: Pink Pink N/A Necrotic Amount: Small (1-33%) Medium (34-66%) N/A Exposed Structures: Fat Layer (Subcutaneous Fat Layer (Subcutaneous N/A Tissue) Exposed: Yes Tissue) Exposed: Yes Fascia: No Tendon: No Muscle: No NABIL, BUBOLZ ([id number]) Joint: No Bone: No Epithelialization: None None N/A Periwound Skin Texture: Excoriation: No Excoriation: No N/A Induration: No Induration: No Callus: No Callus: No Crepitus: No Crepitus: No Rash: No Rash: No Scarring: No Scarring: No Periwound Skin Moisture: Dry/Scaly: Yes Maceration: No N/A Maceration: No Dry/Scaly: No Periwound Skin Color: Hemosiderin Staining: Yes Hemosiderin Staining: Yes N/A Atrophie Blanche: No Atrophie Blanche: No Cyanosis: No Cyanosis: No Ecchymosis: No Ecchymosis: No Erythema: No Erythema: No Mottled: No Mottled: No Pallor: No Pallor: No Rubor: No Rubor: No Temperature: No Abnormality No Abnormality N/A Tenderness on Palpation: No No  N/A Treatment Notes Wound #1 (Left, Lateral Lower Leg) Notes silver collagen, ABD, 3-Layer to Left, Kerlix and Coban to Right Wound #2 (Right, Anterior Lower Leg) Notes silver collagen, ABD, 3-Layer to Left, Kerlix and Coban to Right Electronic Signature(s) Signed: 08/23/2018 3:44:07 PM By: Linton Ham MD Entered By: Linton Ham on 08/23/2018 14:22:32 [name] ([id number]) -------------------------------------------------------------------------------- Multi-Disciplinary Care Plan Details Patient Name: [name] Date of Service: 08/23/2018 1:30 PM Medical Record Number: [id number] Patient Account Number: [id number] Date of Birth/Sex: 04/10/1931 (85 y.o. M) Treating RN: Cornell Barman Primary Care Bradely Rudin: Fulton Reek Other Clinician: Referring Leyani Gargus: Fulton Reek Treating Rees Santistevan/Extender: Tito Dine in Treatment: 14 Active Inactive Orientation to the Wound Care Program Nursing Diagnoses: Knowledge deficit related to the wound healing center program Goals: Patient/caregiver will verbalize understanding of the [place] Date Initiated: 05/17/2018 Target Resolution Date: 06/17/2018 Goal Status: Active Interventions: Provide education on orientation to the wound center Notes: Venous Leg Ulcer Nursing Diagnoses: Potential for venous Insuffiency (use before diagnosis confirmed) Goals: Patient/caregiver will verbalize understanding of disease process and disease management Date Initiated: 05/17/2018 Target Resolution Date: 06/17/2018 Goal Status: Active Interventions: Assess peripheral edema status every visit. Treatment Activities: Non-invasive vascular studies : 05/17/2018 Notes: Wound/Skin Impairment Nursing Diagnoses: Impaired tissue integrity Goals: Patient/caregiver will verbalize understanding of skin care regimen Date Initiated: 05/17/2018 Target Resolution Date: 05/17/2018 Goal Status: Active AADVIK, ROKER ([id number]) Ulcer/skin breakdown will heal within 14 weeks Date Initiated: 05/17/2018 Target Resolution Date: 08/16/2018 Goal Status: Active Interventions: Assess ulceration(s) every visit Treatment Activities: Referred to DME Carliss Porcaro for dressing supplies : 05/17/2018 Topical wound management initiated : 05/17/2018 Notes: Electronic Signature(s) Signed: 08/23/2018 4:31:15 PM By: Gretta Cool, BSN, RN, CWS, Kim RN, BSN Entered By: Gretta Cool, BSN, RN, CWS, Kim on 08/23/2018 14:05:19 [name] ([id number]) -------------------------------------------------------------------------------- Pain Assessment Details Patient Name: [name] Date of Service: 08/23/2018 1:30 PM Medical Record Number: [id number] Patient Account Number: [id number] Date of Birth/Sex: Sep 05, 1930 (85 y.o. M) Treating RN: Montey Hora Primary Care Jamilya Sarrazin: Fulton Reek Other Clinician: Referring Tallan Sandoz: Fulton Reek Treating Lenoria Narine/Extender: Tito Dine in Treatment: 14 Active Problems Location of Pain Severity and Description of Pain Patient Has Paino No Site Locations Pain Management and Medication Current Pain Management: Electronic Signature(s) Signed: 08/23/2018 2:28:39 PM By: Montey Hora Entered By: Montey Hora on 08/23/2018 13:35:42 [name] ([id number]) -------------------------------------------------------------------------------- Patient/Caregiver Education Details Patient Name: [name] Date of Service: 08/23/2018 1:30 PM Medical  Record Number: [id number] Patient Account Number: [id number] Date of Birth/Gender: [birth year] (85 y.o. M) Treating RN: Cornell Barman Primary Care Physician: Fulton Reek Other Clinician: Referring Physician: Fulton Reek Treating Physician/Extender: Tito Dine in Treatment: 14 Education Assessment Education Provided To: Patient Education Topics Provided Wound/Skin Impairment: Handouts: Caring for Your  Ulcer Methods: Demonstration, Explain/Verbal Responses: State content correctly Electronic Signature(s) Signed: 08/23/2018 4:31:15 PM By: Gretta Cool, BSN, RN, CWS, Kim RN, BSN Entered By: Gretta Cool, BSN, RN, CWS, Kim on 08/23/2018 14:08:31 [name] ([id number]) -------------------------------------------------------------------------------- Wound Assessment Details Patient Name: [name] Date of Service: 08/23/2018 1:30 PM Medical Record Number: [id number] Patient Account Number: [id number] Date of Birth/Sex: 1931-01-24 (85 y.o. M) Treating RN: Montey Hora Primary Care Gareld Obrecht: Fulton Reek Other Clinician: Referring Aidan Moten: Fulton Reek Treating Ephrem Carrick/Extender: Tito Dine in Treatment: 14 Wound Status Wound Number: 1 Primary Lymphedema Etiology: Wound Location: Left Lower Leg - Lateral Wound Status: Open Wounding Event: Gradually Appeared Comorbid Lymphedema, Coronary Artery Disease, Date Acquired: 11/14/2017 History: Hypertension Weeks Of Treatment: 14 Clustered Wound: Yes Photos Photo Uploaded By: Montey Hora on 08/23/2018 13:51:35 Wound Measurements Length: (cm) 0.5 Width: (cm) 0.4 Depth: (cm) 0.1 Area: (cm) 0.157 Volume: (cm) 0.016 % Reduction in Area: 95% % Reduction in Volume: 94.9% Epithelialization: None Tunneling: No Undermining: No Wound Description Full Thickness Without Exposed Support Foul O Classification: Structures Slough Wound Margin: Flat and Intact Exudate Medium Amount: Exudate Type: Serous Exudate Color: amber dor After Cleansing: No /Fibrino Yes Wound Bed Granulation Amount: Large (67-100%) Exposed Structure Granulation Quality: Pink Fat Layer (Subcutaneous Tissue) Exposed: Yes Necrotic Amount: Small (1-33%) Necrotic Quality: Adherent Slough Periwound Skin Texture Texture Color No Abnormalities Noted: No No Abnormalities Noted: No HARVARD, ZEISS ([id number]) Callus: No Atrophie Blanche:  No Crepitus: No Cyanosis: No Excoriation: No Ecchymosis: No Induration: No Erythema: No Rash: No Hemosiderin Staining: Yes Scarring: No Mottled: No Pallor: No Moisture Rubor: No No Abnormalities Noted: No Dry / Scaly: Yes Temperature / Pain Maceration: No Temperature: No Abnormality Treatment Notes Wound #1 (Left, Lateral Lower Leg) Notes silver collagen, ABD, 3-Layer to Left, Kerlix and Coban to Right Electronic Signature(s) Signed: 08/23/2018 2:28:39 PM By: Montey Hora Entered By: Montey Hora on 08/23/2018 13:48:02 [name] ([id number]) -------------------------------------------------------------------------------- Wound Assessment Details Patient Name: [name] Date of Service: 08/23/2018 1:30 PM Medical Record Number: [id number] Patient Account Number: [id number] Date of Birth/Sex: 04-14-1931 (85 y.o. M) Treating RN: Montey Hora Primary Care Amiya Escamilla: Fulton Reek Other Clinician: Referring Jeramine Delis: Fulton Reek Treating Jettson Crable/Extender: Tito Dine in Treatment: 14 Wound Status Wound Number: 2 Primary Lymphedema Etiology: Wound Location: Right Lower Leg - Anterior Wound Status: Open Wounding Event: Gradually Appeared Comorbid Lymphedema, Coronary Artery Disease, Date Acquired: 08/21/2018 History: Hypertension Weeks Of Treatment: 0 Clustered Wound: No Photos Photo Uploaded By: Montey Hora on 08/23/2018 13:51:36 Wound Measurements Length: (cm) 1.3 Width: (cm) 1.2 Depth: (cm) 0.1 Area: (cm) 1.225 Volume: (cm) 0.123 % Reduction in Area: % Reduction in Volume: Epithelialization: None Tunneling: No Undermining: No Wound Description Full Thickness Without Exposed Support Foul O Classification: Structures Slough Wound Margin: Flat and Intact Exudate Medium Amount: Exudate Type: Serous Exudate Color: amber dor After Cleansing: No /Fibrino Yes Wound Bed Granulation Amount: Medium (34-66%) Exposed  Structure Granulation Quality: Pink Fascia Exposed: No Necrotic Amount: Medium (34-66%) Fat Layer (Subcutaneous Tissue) Exposed: Yes Necrotic Quality: Adherent Slough Tendon Exposed: No Muscle Exposed: No Joint Exposed: No Bone Exposed: No ZAYDON, KINSER ([id number]) Periwound Skin Texture  Texture Color No Abnormalities Noted: No No Abnormalities Noted: No Callus: No Atrophie Blanche: No Crepitus: No Cyanosis: No Excoriation: No Ecchymosis: No Induration: No Erythema: No Rash: No Hemosiderin Staining: Yes Scarring: No Mottled: No Pallor: No Moisture Rubor: No No Abnormalities Noted: No Dry / Scaly: No Temperature / Pain Maceration: No Temperature: No Abnormality Treatment Notes Wound #2 (Right, Anterior Lower Leg) Notes silver collagen, ABD, 3-Layer to Left, Kerlix and Coban to Right Electronic Signature(s) Signed: 08/23/2018 2:28:39 PM By: Montey Hora Entered By: Montey Hora on 08/23/2018 13:49:33 [name] ([id number]) -------------------------------------------------------------------------------- Vitals Details Patient Name: [name] Date of Service: 08/23/2018 1:30 PM Medical Record Number: [id number] Patient Account Number: [id number] Date of Birth/Sex: December 01, 1930 (85 y.o. M) Treating RN: Montey Hora Primary Care Faustine Tates: Fulton Reek Other Clinician: Referring Suren Payne: Fulton Reek Treating Jazmine Heckman/Extender: Tito Dine in Treatment: 14 Vital Signs Time Taken: 13:35 Temperature (F): 97.6 Height (in): 71 Pulse (bpm): 74 Weight (lbs): 252 Respiratory Rate (breaths/min): 18 Body Mass Index (BMI): 35.1 Blood Pressure (mmHg): 138/62 Reference Range: 80 - 120 mg / dl Electronic Signature(s) Signed: 08/23/2018 2:28:39 PM By: Montey Hora Entered By: Montey Hora on 08/23/2018 13:40:38

## 2018-08-30 ENCOUNTER — Other Ambulatory Visit: Payer: Self-pay

## 2018-08-30 ENCOUNTER — Encounter: Payer: Medicare Other | Admitting: Internal Medicine

## 2018-08-30 DIAGNOSIS — L97228 Non-pressure chronic ulcer of left calf with other specified severity: Secondary | ICD-10-CM | POA: Diagnosis not present

## 2018-08-31 NOTE — Progress Notes (Signed)
ASH, MCELWAIN ([id number]) Visit Report for 08/30/2018 Arrival Information Details Patient Name: Steven Meza, Steven Meza Date of Service: 08/30/2018 3:45 PM Medical Record Number: [id number] Patient Account Number: [id number] Date of Birth/Sex: March 05, 1931 (85 y.o. M) Treating RN: Montey Hora Primary Care Anhthu Perdew: Fulton Reek Other Clinician: Referring Maaz Spiering: Fulton Reek Treating Renton Berkley/Extender: Tito Dine in Treatment: 15 Visit Information History Since Last Visit Added or deleted any medications: No Patient Arrived: Cane Any new allergies or adverse reactions: No Arrival Time: 16:00 Had a fall or experienced change in No Accompanied By: self activities of daily living that may affect Transfer Assistance: None risk of falls: Patient Identification Verified: Yes Signs or symptoms of abuse/neglect since last visito No Secondary Verification Process Completed: Yes Hospitalized since last visit: No Implantable device outside of the clinic excluding No cellular tissue based products placed in the center since last visit: Has Dressing in Place as Prescribed: Yes Has Compression in Place as Prescribed: Yes Pain Present Now: No Electronic Signature(s) Signed: 08/30/2018 4:42:28 PM By: Montey Hora Entered By: Montey Hora on 08/30/2018 16:07:30 Steven Meza ([id number]) -------------------------------------------------------------------------------- Clinic Level of Care Assessment Details Patient Name: Steven Meza Date of Service: 08/30/2018 3:45 PM Medical Record Number: [id number] Patient Account Number: [id number] Date of Birth/Sex: 10-24-1930 (85 y.o. M) Treating RN: Harold Barban Primary Care Ariell Gunnels: Fulton Reek Other Clinician: Referring Anesia Blackwell: Fulton Reek Treating Suzie Vandam/Extender: Tito Dine in Treatment: 15 Clinic Level of Care Assessment Items TOOL 4 Quantity Score []  - Use when only an EandM is performed  on FOLLOW-UP visit 0 ASSESSMENTS - Nursing Assessment / Reassessment X - Reassessment of Co-morbidities (includes updates in patient status) 1 10 X- 1 5 Reassessment of Adherence to Treatment Plan ASSESSMENTS - Wound and Skin Assessment / Reassessment []  - Simple Wound Assessment / Reassessment - one wound 0 X- 2 5 Complex Wound Assessment / Reassessment - multiple wounds []  - 0 Dermatologic / Skin Assessment (not related to wound area) ASSESSMENTS - Focused Assessment []  - Circumferential Edema Measurements - multi extremities 0 []  - 0 Nutritional Assessment / Counseling / Intervention []  - 0 Lower Extremity Assessment (monofilament, tuning fork, pulses) []  - 0 Peripheral Arterial Disease Assessment (using hand held doppler) ASSESSMENTS - Ostomy and/or Continence Assessment and Care []  - Incontinence Assessment and Management 0 []  - 0 Ostomy Care Assessment and Management (repouching, etc.) PROCESS - Coordination of Care X - Simple Patient / Family Education for ongoing care 1 15 []  - 0 Complex (extensive) Patient / Family Education for ongoing care []  - 0 Staff obtains Programmer, systems, Records, Test Results / Process Orders X- 1 10 Staff telephones HHA, Nursing Homes / Clarify orders / etc []  - 0 Routine Transfer to another Facility (non-emergent condition) []  - 0 Routine Hospital Admission (non-emergent condition) []  - 0 New Admissions / Biomedical engineer / Ordering NPWT, Apligraf, etc. []  - 0 Emergency Hospital Admission (emergent condition) X- 1 10 Simple Discharge Coordination Steven Meza, Steven Meza ([id number]) []  - 0 Complex (extensive) Discharge Coordination PROCESS - Special Needs []  - Pediatric / Minor Patient Management 0 []  - 0 Isolation Patient Management []  - 0 Hearing / Language / Visual special needs []  - 0 Assessment of Community assistance (transportation, D/C planning, etc.) []  - 0 Additional assistance / Altered mentation []  - 0 Support Surface(s)  Assessment (bed, cushion, seat, etc.) INTERVENTIONS - Wound Cleansing / Measurement []  - Simple Wound Cleansing - one wound 0 X- 2 5 Complex Wound Cleansing - multiple wounds  X- 1 5 Wound Imaging (photographs - any number of wounds) []  - 0 Wound Tracing (instead of photographs) []  - 0 Simple Wound Measurement - one wound X- 2 5 Complex Wound Measurement - multiple wounds INTERVENTIONS - Wound Dressings X - Small Wound Dressing one or multiple wounds 2 10 []  - 0 Medium Wound Dressing one or multiple wounds []  - 0 Large Wound Dressing one or multiple wounds []  - 0 Application of Medications - topical []  - 0 Application of Medications - injection INTERVENTIONS - Miscellaneous []  - External ear exam 0 []  - 0 Specimen Collection (cultures, biopsies, blood, body fluids, etc.) []  - 0 Specimen(s) / Culture(s) sent or taken to Lab for analysis []  - 0 Patient Transfer (multiple staff / Civil Service fast streamer / Similar devices) []  - 0 Simple Staple / Suture removal (25 or less) []  - 0 Complex Staple / Suture removal (26 or more) []  - 0 Hypo / Hyperglycemic Management (close monitor of Blood Glucose) []  - 0 Ankle / Brachial Index (ABI) - do not check if billed separately X- 1 5 Vital Signs Steven Meza, Steven Meza ([id number]) Has the patient been seen at the hospital within the last three years: Yes Total Score: 110 Level Of Care: New/Established - Level 3 Electronic Signature(s) Signed: 08/30/2018 4:45:14 PM By: Harold Barban Entered By: Harold Barban on 08/30/2018 16:30:10 Steven Meza ([id number]) -------------------------------------------------------------------------------- Encounter Discharge Information Details Patient Name: Steven Meza Date of Service: 08/30/2018 3:45 PM Medical Record Number: [id number] Patient Account Number: [id number] Date of Birth/Sex: Sep 23, 1930 (85 y.o. M) Treating RN: Harold Barban Primary Care Yetunde Leis: Fulton Reek Other Clinician: Referring  Jazleen Robeck: Fulton Reek Treating Konstantinos Cordoba/Extender: Tito Dine in Treatment: 15 Encounter Discharge Information Items Discharge Condition: Stable Ambulatory Status: Cane Discharge Destination: Home Transportation: Private Auto Accompanied By: self Schedule Follow-up Appointment: Yes Clinical Summary of Care: Electronic Signature(s) Signed: 08/30/2018 4:41:26 PM By: Harold Barban Entered By: Harold Barban on 08/30/2018 16:41:26 Steven Meza ([id number]) -------------------------------------------------------------------------------- Lower Extremity Assessment Details Patient Name: Steven Meza Date of Service: 08/30/2018 3:45 PM Medical Record Number: [id number] Patient Account Number: [id number] Date of Birth/Sex: 13-Aug-1930 (85 y.o. M) Treating RN: Montey Hora Primary Care Perian Tedder: Fulton Reek Other Clinician: Referring Tida Saner: Fulton Reek Treating Fredie Majano/Extender: Tito Dine in Treatment: 15 Edema Assessment Assessed: [Left: No] [Right: No] Edema: [Left: Yes] [Right: Yes] Calf Left: Right: Point of Measurement: 32 cm From Medial Instep 47 cm 41 cm Ankle Left: Right: Point of Measurement: 13 cm From Medial Instep 28 cm 28 cm Vascular Assessment Pulses: Dorsalis Pedis Palpable: [Left:Yes] [Right:Yes] Electronic Signature(s) Signed: 08/30/2018 4:42:28 PM By: Montey Hora Entered By: Montey Hora on 08/30/2018 16:16:20 Steven Meza ([id number]) -------------------------------------------------------------------------------- Multi Wound Chart Details Patient Name: Steven Meza Date of Service: 08/30/2018 3:45 PM Medical Record Number: [id number] Patient Account Number: [id number] Date of Birth/Sex: 03/09/1931 (85 y.o. M) Treating RN: Harold Barban Primary Care Joycelyn Liska: Fulton Reek Other Clinician: Referring Maria Gallicchio: Fulton Reek Treating Zuhayr Deeney/Extender: Tito Dine in Treatment:  15 Vital Signs Height(in): 71 Pulse(bpm): 27 Weight(lbs): 252 Blood Pressure(mmHg): 123/52 Body Mass Index(BMI): 35 Temperature(F): 98.6 Respiratory Rate 18 (breaths/min): Photos: [N/A:N/A] Wound Location: Left Lower Leg - Lateral Right Lower Leg - Anterior N/A Wounding Event: Gradually Appeared Gradually Appeared N/A Primary Etiology: Lymphedema Lymphedema N/A Comorbid History: Lymphedema, Coronary Artery Lymphedema, Coronary Artery N/A Disease, Hypertension Disease, Hypertension Date Acquired: 11/14/2017 08/21/2018 N/A Weeks of Treatment: 15 1 N/A Wound Status: Open Open N/A Clustered Wound: Yes  No N/A Measurements L x W x D 0.4x0.4x0.1 1.2x1.1x0.1 N/A (cm) Area (cm) : 0.126 1.037 N/A Volume (cm) : 0.013 0.104 N/A % Reduction in Area: 96.00% 15.30% N/A % Reduction in Volume: 95.90% 15.40% N/A Classification: Full Thickness Without Full Thickness Without N/A Exposed Support Structures Exposed Support Structures Exudate Amount: Medium Medium N/A Exudate Type: Serous Serous N/A Exudate Color: amber amber N/A Wound Margin: Flat and Intact Flat and Intact N/A Granulation Amount: Large (67-100%) Large (67-100%) N/A Granulation Quality: Pink Pink N/A Necrotic Amount: Small (1-33%) None Present (0%) N/A Exposed Structures: Fat Layer (Subcutaneous Fat Layer (Subcutaneous N/A Tissue) Exposed: Yes Tissue) Exposed: Yes Fascia: No Tendon: No Muscle: No Steven Meza, Steven Meza ([id number]) Joint: No Bone: No Epithelialization: None None N/A Periwound Skin Texture: Excoriation: No Excoriation: No N/A Induration: No Induration: No Callus: No Callus: No Crepitus: No Crepitus: No Rash: No Rash: No Scarring: No Scarring: No Periwound Skin Moisture: Dry/Scaly: Yes Maceration: No N/A Maceration: No Dry/Scaly: No Periwound Skin Color: Hemosiderin Staining: Yes Hemosiderin Staining: Yes N/A Atrophie Blanche: No Atrophie Blanche: No Cyanosis: No Cyanosis: No Ecchymosis:  No Ecchymosis: No Erythema: No Erythema: No Mottled: No Mottled: No Pallor: No Pallor: No Rubor: No Rubor: No Temperature: No Abnormality No Abnormality N/A Tenderness on Palpation: No No N/A Treatment Notes Electronic Signature(s) Signed: 08/31/2018 7:17:18 AM By: Linton Ham MD Entered By: Linton Ham on 08/30/2018 16:30:30 Steven Meza ([id number]) -------------------------------------------------------------------------------- Multi-Disciplinary Care Plan Details Patient Name: Steven Meza Date of Service: 08/30/2018 3:45 PM Medical Record Number: [id number] Patient Account Number: [id number] Date of Birth/Sex: 1931/02/19 (85 y.o. M) Treating RN: Harold Barban Primary Care Manha Amato: Fulton Reek Other Clinician: Referring Tegan Burnside: Fulton Reek Treating Rohil Lesch/Extender: Tito Dine in Treatment: 15 Active Inactive Orientation to the Wound Care Program Nursing Diagnoses: Knowledge deficit related to the wound healing center program Goals: Patient/caregiver will verbalize understanding of the [facility] Program Date Initiated: 05/17/2018 Target Resolution Date: 06/17/2018 Goal Status: Active Interventions: Provide education on orientation to the wound center Notes: Venous Leg Ulcer Nursing Diagnoses: Potential for venous Insuffiency (use before diagnosis confirmed) Goals: Patient/caregiver will verbalize understanding of disease process and disease management Date Initiated: 05/17/2018 Target Resolution Date: 06/17/2018 Goal Status: Active Interventions: Assess peripheral edema status every visit. Treatment Activities: Non-invasive vascular studies : 05/17/2018 Notes: Wound/Skin Impairment Nursing Diagnoses: Impaired tissue integrity Goals: Patient/caregiver will verbalize understanding of skin care regimen Date Initiated: 05/17/2018 Target Resolution Date: 05/17/2018 Goal Status: Active Steven Meza, Steven Meza  ([id number]) Ulcer/skin breakdown will heal within 14 weeks Date Initiated: 05/17/2018 Target Resolution Date: 08/16/2018 Goal Status: Active Interventions: Assess ulceration(s) every visit Treatment Activities: Referred to DME Joushua Dugar for dressing supplies : 05/17/2018 Topical wound management initiated : 05/17/2018 Notes: Electronic Signature(s) Signed: 08/30/2018 4:45:14 PM By: Harold Barban Entered By: Harold Barban on 08/30/2018 16:27:48 Steven Meza ([id number]) -------------------------------------------------------------------------------- Pain Assessment Details Patient Name: Steven Meza Date of Service: 08/30/2018 3:45 PM Medical Record Number: [id number] Patient Account Number: [id number] Date of Birth/Sex: 1930-09-28 (85 y.o. M) Treating RN: Montey Hora Primary Care Giovanny Dugal: Fulton Reek Other Clinician: Referring Usama Harkless: Fulton Reek Treating Rut Betterton/Extender: Tito Dine in Treatment: 15 Active Problems Location of Pain Severity and Description of Pain Patient Has Paino No Site Locations Pain Management and Medication Current Pain Management: Electronic Signature(s) Signed: 08/30/2018 4:42:28 PM By: Montey Hora Entered By: Montey Hora on 08/30/2018 16:07:35 Steven Meza ([id number]) -------------------------------------------------------------------------------- Patient/Caregiver Education Details Patient Name: Steven Meza Date of Service: 08/30/2018  3:45 PM Medical Record Number: [id number] Patient Account Number: [id number] Date of Birth/Gender: 02/27/1931 (85 y.o. M) Treating RN: Harold Barban Primary Care Physician: Fulton Reek Other Clinician: Referring Physician: Fulton Reek Treating Physician/Extender: Tito Dine in Treatment: 15 Education Assessment Education Provided To: Patient Education Topics Provided Wound/Skin Impairment: Handouts: Caring for Your Ulcer Methods:  Demonstration, Explain/Verbal Responses: State content correctly Electronic Signature(s) Signed: 08/30/2018 4:45:14 PM By: Harold Barban Entered By: Harold Barban on 08/30/2018 16:28:38 Steven Meza ([id number]) -------------------------------------------------------------------------------- Wound Assessment Details Patient Name: Steven Meza Date of Service: 08/30/2018 3:45 PM Medical Record Number: [id number] Patient Account Number: [id number] Date of Birth/Sex: 1931/04/03 (85 y.o. M) Treating RN: Montey Hora Primary Care Sareen Randon: Fulton Reek Other Clinician: Referring Francisca Langenderfer: Fulton Reek Treating Ilisa Hayworth/Extender: Tito Dine in Treatment: 15 Wound Status Wound Number: 1 Primary Lymphedema Etiology: Wound Location: Left Lower Leg - Lateral Wound Status: Open Wounding Event: Gradually Appeared Comorbid Lymphedema, Coronary Artery Disease, Date Acquired: 11/14/2017 History: Hypertension Weeks Of Treatment: 15 Clustered Wound: Yes Photos Wound Measurements Length: (cm) 0.4 Width: (cm) 0.4 Depth: (cm) 0.1 Area: (cm) 0.126 Volume: (cm) 0.013 % Reduction in Area: 96% % Reduction in Volume: 95.9% Epithelialization: None Tunneling: No Undermining: No Wound Description Full Thickness Without Exposed Support Foul O Classification: Structures Slough Wound Margin: Flat and Intact Exudate Medium Amount: Exudate Type: Serous Exudate Color: amber dor After Cleansing: No /Fibrino Yes Wound Bed Granulation Amount: Large (67-100%) Exposed Structure Granulation Quality: Pink Fat Layer (Subcutaneous Tissue) Exposed: Yes Necrotic Amount: Small (1-33%) Necrotic Quality: Adherent Slough Periwound Skin Texture Texture Color No Abnormalities Noted: No No Abnormalities Noted: No Steven Meza, Steven Meza ([id number]) Callus: No Atrophie Blanche: No Crepitus: No Cyanosis: No Excoriation: No Ecchymosis: No Induration: No Erythema: No Rash:  No Hemosiderin Staining: Yes Scarring: No Mottled: No Pallor: No Moisture Rubor: No No Abnormalities Noted: No Dry / Scaly: Yes Temperature / Pain Maceration: No Temperature: No Abnormality Treatment Notes Wound #1 (Left, Lateral Lower Leg) Notes Silver collagen, kerlix/coban on right 3-Layer on left Electronic Signature(s) Signed: 08/30/2018 4:42:28 PM By: Montey Hora Entered By: Montey Hora on 08/30/2018 16:16:55 Steven Meza ([id number]) -------------------------------------------------------------------------------- Wound Assessment Details Patient Name: Steven Meza Date of Service: 08/30/2018 3:45 PM Medical Record Number: [id number] Patient Account Number: [id number] Date of Birth/Sex: 03-21-1931 (85 y.o. M) Treating RN: Montey Hora Primary Care Tomie Elko: Fulton Reek Other Clinician: Referring Zurisadai Helminiak: Fulton Reek Treating Aizley Stenseth/Extender: Tito Dine in Treatment: 15 Wound Status Wound Number: 2 Primary Lymphedema Etiology: Wound Location: Right Lower Leg - Anterior Wound Status: Open Wounding Event: Gradually Appeared Comorbid Lymphedema, Coronary Artery Disease, Date Acquired: 08/21/2018 History: Hypertension Weeks Of Treatment: 1 Clustered Wound: No Photos Wound Measurements Length: (cm) 1.2 Width: (cm) 1.1 Depth: (cm) 0.1 Area: (cm) 1.037 Volume: (cm) 0.104 % Reduction in Area: 15.3% % Reduction in Volume: 15.4% Epithelialization: None Tunneling: No Undermining: No Wound Description Full Thickness Without Exposed Support Foul O Classification: Structures Slough Wound Margin: Flat and Intact Exudate Medium Amount: Exudate Type: Serous Exudate Color: amber dor After Cleansing: No /Fibrino Yes Wound Bed Granulation Amount: Large (67-100%) Exposed Structure Granulation Quality: Pink Fascia Exposed: No Necrotic Amount: None Present (0%) Fat Layer (Subcutaneous Tissue) Exposed: Yes Tendon Exposed:  No Muscle Exposed: No Joint Exposed: No Bone Exposed: No Periwound Skin Texture Steven Meza, Steven A. ([id number]) Texture Color No Abnormalities Noted: No No Abnormalities Noted: No Callus: No Atrophie Blanche: No Crepitus: No Cyanosis: No Excoriation: No Ecchymosis: No Induration: No  Erythema: No Rash: No Hemosiderin Staining: Yes Scarring: No Mottled: No Pallor: No Moisture Rubor: No No Abnormalities Noted: No Dry / Scaly: No Temperature / Pain Maceration: No Temperature: No Abnormality Treatment Notes Wound #2 (Right, Anterior Lower Leg) Notes Silver collagen, kerlix/coban on right 3-Layer on left Electronic Signature(s) Signed: 08/30/2018 4:42:28 PM By: Montey Hora Entered By: Montey Hora on 08/30/2018 16:17:20 Steven Meza ([id number]) -------------------------------------------------------------------------------- Vitals Details Patient Name: Steven Meza Date of Service: 08/30/2018 3:45 PM Medical Record Number: [id number] Patient Account Number: [id number] Date of Birth/Sex: [birth year] (85 y.o. M) Treating RN: Montey Hora Primary Care Rashell Shambaugh: Fulton Reek Other Clinician: Referring Deklin Bieler: Fulton Reek Treating Zonie Crutcher/Extender: Tito Dine in Treatment: 15 Vital Signs Time Taken: 16:07 Temperature (F): 98.6 Height (in): 71 Pulse (bpm): 83 Weight (lbs): 252 Respiratory Rate (breaths/min): 18 Body Mass Index (BMI): 35.1 Blood Pressure (mmHg): 123/52 Reference Range: 80 - 120 mg / dl Electronic Signature(s) Signed: 08/30/2018 4:42:28 PM By: Montey Hora Entered By: Montey Hora on 08/30/2018 16:08:45

## 2018-08-31 NOTE — Progress Notes (Signed)
Steven Meza, Steven Meza ([id number]) Visit Report for 08/30/2018 HPI Details Patient Name: Steven Meza, Steven Meza Date of Service: 08/30/2018 3:45 PM Medical Record Number: [id number] Patient Account Number: [id number] Date of Birth/Sex: Jul 25, 1930 (85 y.o. M) Treating RN: Steven Meza Primary Care Provider: Fulton Meza Other Clinician: Referring Provider: Fulton Meza Treating Provider/Extender: Steven Meza in Treatment: 15 History of Present Illness HPI Description: ADMISSION 05/17/2018 Steven Meza is an 85 year old man who is been treated at the lymphedema clinic for a long period with lymphedema wraps for bilateral lower extremity lymphedema. About 6 months ago they noted small open areas on his left lateral calf just above the ankle. These were open. They are apparently putting some form of ointment on this. They have been referred here for our review of this. The patient has a long history of lymphedema and venous stasis. It says in his records that he has recurrent cellulitis although his wife denies this but she states that the lymphedema may have started with cellulitis several years ago. Also in his record there is a history of bladder cancer which they seem to know very little about however he did not have any radiation to the pelvis or anything that could have contributed to lymphedema that they are aware of. There is no prior wound history. The patient has 2 small punched out areas on the left lateral calf that have adherent debris on the surface. Past medical history; lymphedema, hypertension, cellulitis, hearing loss, morbid obesity, osteoarthritis, venous stasis, bladder CA, diverticulosis. ABIs in our clinic were 0.36 on the right and 0.57 on the left 05/24/2018; corrected age on this patient is 85 versus what I said last week. is 65 versus what I said last week. He has been for his arterial studies which predictably are not very good. On the right his ABI is 0.65. Monophasic waveforms noted at the right  ankle including the posterior tibial and dorsalis pedis. He has triphasic waveforms of the common femoral and profunda femoris triphasic proximal SFA with monophasic distal FSA and popliteal artery. Monophasic tibial waveforms. On the left his ABI is 0.58 monophasic waveforms at the left ankle. Duplex of the left lower extremity demonstrated atherosclerotic change with monophasic waveforms throughout the left lower extremity. There was some concern about left iliac disease and potentially femoral-popliteal and tibial disease. The patient does not describe claudication although according to his wife he over emphasizes his activity. Patient states he is limited by pain in both knees His wounds are on the left lateral calf. 2 small areas with necrotic debris. We have been using silver collagen and light Ace wraps 05/31/2018; wounds on the left lateral calf in the setting of very significant lymphedema and probably PAD. We have been using silver collagen. The base of the small wound looks reasonably improved. I sent him to see Steven Meza however I see now he has an appointment with Steven Meza on February 12 2/5; left lateral calf wound looks the same. His wife is doing a good job with her lymphedema wraps and maintaining that the edema. He most likely has PAD and is due to see Steven Meza of infectious disease on February 12 2/19; left lateral calf wounds look about the same. This looks like wound secondary to chronic venous insufficiency with lymphedema however the patient has very poor arterial studies. We referred him to Steven Meza. Steven Meza did not feel he needed to do anything from an arterial point of view. He did not address the question about the aggressiveness of compression. After some thoughts about  this I elected to go ahead and put him in 3 layer compression which after all would be less compression then the lymphedema clinic was putting on this. I am hopeful that this should be enough to get  some closure of the small wounds 2/26; left lateral calf wound letter this week. He tolerated the 3 layer compression well furthermore he is sleeping in a hospital bed which helps keep his legs up at night. The wound looks better measuring smaller especially in width 3/3; left lateral calf wound about the same size. The 3 layer compression has really helped get the edema down in his leg [name], [name] ([id number]) however. Using silver collagen 3/11; left lateral calf wound better looking wound surface but about the same size. We have been using 3 layer compression which is really helped get the edema down in the left leg. Even after Steven Meza assessment I am reluctant to go to 4 layer compression. He is tolerating 3 layer compression well. We have been using silver collagen 3/18-Patient returns for his left lateral calf wound which overall is looking better, slightly increased in size and dimensions and area, he is tolerating the 3 layer compression, has been dressed with silver collagen which we will continue 3/25oleft lateral calf wound much better looking. Size is smaller. He has been using silver collagen 4/1; gradually getting smaller in size. Using silver collagen with 3 layer compression. I think the patient has some degree of PAD. He saw Steven Meza in consultation, he did not specifically comment on whether he could tolerate 4 layer compression 4/8; the wound is slightly smaller in depth. We are using 3 layer compression with silver collagen. I had him seen for arterial insufficiency however his ABI was 0.57 in the left leg. Steven Meza really did not seem to middle on the degree of compression he could tolerate 4/15; the wound is slightly smaller and slightly less deep. We have been using 3 layer compression with silver collagen. There is not an option for 4-layer compression here. 4/22; the patient is making decent progress on his wound on the left lateral calf however he arrives in  with a superficial wound on the right anterior tibial area. Were not really sure how this happened. He had been using a stocking on the right leg. He has been using silver collagen on the wounds Amazing to me he is he appears to have compression pumps at home. I am not really sure I knew this before. In any case he has not been using them 4/29; left lateral calf wound continues to get gradually smaller. This looks like it is on its way to closure. oThe area on the right anterior tibial area is about the same in terms of size superficial without any depth. This was probably wrap injury oHe tells me that he has been using his compression pumps once a day for 1 hour without any pain Electronic Signature(s) Signed: 08/31/2018 7:17:18 AM By: Linton Ham MD Entered By: Linton Ham on 08/30/2018 16:31:15 Steven Meza ([id number]) -------------------------------------------------------------------------------- Physical Exam Details Patient Name: Steven Meza Date of Service: 08/30/2018 3:45 PM Medical Record Number: [id number] Patient Account Number: [id number] Date of Birth/Sex: 04/07/1931 (85 y.o. M) Treating RN: Steven Meza Primary Care Provider: Fulton Meza Other Clinician: Referring Provider: Fulton Meza Treating Provider/Extender: Steven Meza in Treatment: 15 Constitutional Sitting or standing Blood Pressure is within target range for patient.. Pulse regular and within target range for patient.Marland Kitchen Respirations regular, non-labored and  within target range.. Temperature is normal and within the target range for the patient.Marland Kitchen appears in no distress. Eyes Conjunctivae clear. No discharge. Respiratory Respiratory effort is easy and symmetric bilaterally. Rate is normal at rest and on room air.. Cardiovascular Pedal pulses absent bilaterally.. Edema present in both extremities. This is nonpitting. Lymphedema. More edema in the left leg and on the right but  both legs have less edema with compression. Lymphatic None palpable in the popliteal area bilaterally. Integumentary (Hair, Skin) Changes of chronic venous insufficiency. Notes Wound exam; oOn the left lateral calf this is just about epithelialized over. oOn the right anterior leg a superficial wound which is slightly smaller than last week. This has no depth. No debridement is necessary. Electronic Signature(s) Signed: 08/31/2018 7:17:18 AM By: Linton Ham MD Entered By: Linton Ham on 08/30/2018 16:32:34 Steven Meza ([id number]) -------------------------------------------------------------------------------- Physician Orders Details Patient Name: Steven Meza Date of Service: 08/30/2018 3:45 PM Medical Record Number: [id number] Patient Account Number: [id number] Date of Birth/Sex: 04/30/1931 (85 y.o. 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 Wound #2 Right,Anterior Lower Leg o Clean wound with Normal Saline. Anesthetic (add to Medication List) Wound #1 Left,Lateral Lower Leg o Topical Lidocaine 4% cream applied to wound bed prior to debridement (In Clinic Only). Wound #2 Right,Anterior Lower Leg o Topical Lidocaine 4% cream applied to wound bed prior to debridement (In Clinic Only). Primary Wound Dressing Wound #1 Left,Lateral Lower Leg o Silver  Collagen Wound #2 Right,Anterior Lower Leg o Silver Collagen Secondary Dressing Wound #1 Left,Lateral Lower Leg o ABD pad Wound #2 Right,Anterior Lower Leg o ABD pad Dressing Change Frequency Wound #1 Left,Lateral Lower Leg o Change dressing every week o Other: - as needed Wound #2 Right,Anterior Lower Leg o Change dressing every week Steven Meza, Steven Meza ([id number]) o Other: - as needed Follow-up Appointments Wound #1 Left,Lateral Lower Leg o Return Appointment in 1 week. o Nurse Visit as needed - as needed Wound #2 Right,Anterior Lower Leg o Return Appointment in 1 week. o Nurse Visit as needed - as needed Edema Control Wound #1 Left,Lateral Lower Leg o 3 Layer Compression System - Left Lower Extremity o Compression Pump: Use compression pump on left lower extremity for 30 minutes, twice daily. Wound #2 Right,Anterior Lower Leg o Kerlix and Coban - Right Lower Extremity o Compression Pump: Use compression pump on right lower extremity for 30 minutes, twice daily. Electronic Signature(s) Signed: 08/30/2018 4:45:14 PM By: Steven Meza Signed: 08/31/2018 7:17:18 AM By: Linton Ham MD Entered By: Steven Meza on 08/30/2018 16:37:10 Steven Meza ([id number]) -------------------------------------------------------------------------------- Problem List Details Patient Name: Steven Meza Date of Service: 08/30/2018 3:45 PM Medical Record Number: [id number] Patient Account Number: [id number] Date of Birth/Sex: 1930-11-10 (85 y.o. 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 M) Treating RN: Steven Meza Primary Care Provider: Fulton Meza Other Clinician: Referring Provider: Fulton Meza Treating Provider/Extender: Steven Meza in Treatment: 15 Subjective History of Present Illness (HPI) ADMISSION 05/17/2018 Mr. Fullwood is an 85 year old man who is been treated at the lymphedema clinic for a long period with lymphedema wraps for bilateral lower extremity lymphedema. About 6 months ago they noted small open areas on his left lateral calf just above the ankle. These were open. They are apparently putting some form of ointment on this. They have been referred here for our review of this. The patient has a long history of lymphedema and venous stasis. It says in his records that he has recurrent cellulitis although his wife denies this but she states that the lymphedema may have started with cellulitis several years ago. Also in his record there is a history of bladder cancer which they seem to know very little about however he did not have any radiation to the pelvis or anything that could have contributed to lymphedema that they are aware of. There is no prior wound history. The patient has 2 small punched out areas on the left lateral calf that  have adherent debris on the surface. Past medical history; lymphedema, hypertension, cellulitis, hearing loss, morbid obesity, osteoarthritis, venous stasis, bladder CA, diverticulosis. ABIs in our clinic were 0.36 on the right and 0.57 on the left 05/24/2018; corrected age on this patient is 85 versus what I said last week. is 5 versus what I said last week. He has been for his arterial studies which predictably are not very good. On the right his ABI is 0.65. Monophasic waveforms noted at the right ankle including the posterior tibial and dorsalis pedis. He has triphasic waveforms of the common femoral and profunda femoris triphasic proximal SFA with monophasic distal FSA and popliteal artery. Monophasic tibial waveforms. On the left his ABI is 0.58 monophasic waveforms at the left ankle. Duplex of the left lower extremity demonstrated atherosclerotic change with monophasic waveforms throughout the left lower extremity. There was some concern about left iliac disease and potentially femoral-popliteal and tibial disease. The patient does not describe claudication although according to his wife he over emphasizes his activity. Patient states he is limited by pain in both knees His wounds are on the left lateral calf. 2 small areas with necrotic debris. We have been using silver collagen and light Ace wraps 05/31/2018; wounds on the left lateral calf in the setting of very significant lymphedema and probably PAD. We have been using silver collagen. The base of the small wound looks reasonably improved. I sent him to see Steven Meza however I see now he has an appointment with Steven Meza on February 12 2/5; left lateral calf wound looks the same. His wife is doing a good job with her lymphedema wraps and maintaining that the edema. He most likely has PAD and is due to see Steven Meza of infectious disease on February 12 2/19; left lateral calf wounds  look about the same. This looks like wound secondary to chronic venous insufficiency  with lymphedema however the patient has very poor arterial studies. We referred him to Steven Meza. Steven Meza did not feel he needed to do anything from an arterial point of view. He did not address the question about the aggressiveness of compression. After some thoughts about this I elected to go ahead and put him in 3 layer compression which after all would be less compression then the lymphedema clinic was putting on this. I am hopeful that this should be enough to get some closure of the small wounds 2/26; left lateral calf wound letter this week. He tolerated the 3 layer compression well furthermore he is sleeping in a hospital bed which helps keep his legs up at night. The wound looks better measuring smaller especially in width 3/3; left lateral calf wound about the same size. The 3 layer compression has really helped get the edema down in his leg however. Using silver collagen 3/11; left lateral calf wound better looking wound surface but about the same size. We have been using 3 layer compression which is really helped get the edema down in the left leg. Even after Steven Meza assessment I am reluctant to go to 4 layer Steven Meza, Steven A. ([id number]) compression. He is tolerating 3 layer compression well. We have been using silver collagen 3/18-Patient returns for his left lateral calf wound which overall is looking better, slightly increased in size and dimensions and area, he is tolerating the 3 layer compression, has been dressed with silver collagen which we will continue 3/25 left lateral calf wound much better looking. Size is smaller. He has been using silver collagen 4/1; gradually getting smaller in size. Using silver collagen with 3 layer compression. I think the patient has some degree of PAD. He saw Steven Meza in consultation, he did not specifically comment on whether he could tolerate 4 layer compression 4/8; the wound is slightly smaller in depth. We are using 3 layer  compression with silver collagen. I had him seen for arterial insufficiency however his ABI was 0.57 in the left leg. Steven Meza really did not seem to middle on the degree of compression he could tolerate 4/15; the wound is slightly smaller and slightly less deep. We have been using 3 layer compression with silver collagen. There is not an option for 4-layer compression here. 4/22; the patient is making decent progress on his wound on the left lateral calf however he arrives in with a superficial wound on the right anterior tibial area. Were not really sure how this happened. He had been using a stocking on the right leg. He has been using silver collagen on the wounds Amazing to me he is he appears to have compression pumps at home. I am not really sure I knew this before. In any case he has not been using them 4/29; left lateral calf wound continues to get gradually smaller. This looks like it is on its way to closure. The area on the right anterior tibial area is about the same in terms of size superficial without any depth. This was probably wrap injury He tells me that he has been using his compression pumps once a day for 1 hour without any pain Objective Constitutional Sitting or standing Blood Pressure is within target range for patient.. Pulse regular and within target range for patient.Marland Kitchen Respirations regular, non-labored and within target range.. Temperature is normal and within the target range for the  patient.Marland Kitchen appears in no distress. Vitals Time Taken: 4:07 PM, Height: 71 in, Weight: 252 lbs, BMI: 35.1, Temperature: 98.6 F, Pulse: 83 bpm, Respiratory Rate: 18 breaths/min, Blood Pressure: 123/52 mmHg. Eyes Conjunctivae clear. No discharge. Respiratory Respiratory effort is easy and symmetric bilaterally. Rate is normal at rest and on room air.. Cardiovascular Pedal pulses absent bilaterally.. Edema present in both extremities. This is nonpitting. Lymphedema. More edema in the  left leg and on the right but both legs have less edema with compression. Lymphatic None palpable in the popliteal area bilaterally. General Notes: Wound exam; On the left lateral calf this is just about epithelialized over. On the right anterior leg a superficial wound which is slightly smaller than last week. This has no depth. No debridement is necessary. Integumentary (Hair, Skin) Steven Meza, Steven Meza. ([id number]) Changes of chronic venous insufficiency. Wound #1 status is Open. Original cause of wound was Gradually Appeared. The wound is located on the Left,Lateral Lower Leg. The wound measures 0.4cm length x 0.4cm width x 0.1cm depth; 0.126cm^2 area and 0.013cm^3 volume. There is Fat Layer (Subcutaneous Tissue) Exposed exposed. There is no tunneling or undermining noted. There is a medium amount of serous drainage noted. The wound margin is flat and intact. There is large (67-100%) pink granulation within the wound bed. There is a small (1-33%) amount of necrotic tissue within the wound bed including Adherent Slough. The periwound skin appearance exhibited: Dry/Scaly, Hemosiderin Staining. The periwound skin appearance did not exhibit: Callus, Crepitus, Excoriation, Induration, Rash, Scarring, Maceration, Atrophie Blanche, Cyanosis, Ecchymosis, Mottled, Pallor, Rubor, Erythema. Periwound temperature was noted as No Abnormality. Wound #2 status is Open. Original cause of wound was Gradually Appeared. The wound is located on the Right,Anterior Lower Leg. The wound measures 1.2cm length x 1.1cm width x 0.1cm depth; 1.037cm^2 area and 0.104cm^3 volume. There is Fat Layer (Subcutaneous Tissue) Exposed exposed. There is no tunneling or undermining noted. There is a medium amount of serous drainage noted. The wound margin is flat and intact. There is large (67-100%) pink granulation within the wound bed. There is no necrotic tissue within the wound bed. The periwound skin appearance exhibited:  Hemosiderin Staining. The periwound skin appearance did not exhibit: Callus, Crepitus, Excoriation, Induration, Rash, Scarring, Dry/Scaly, Maceration, Atrophie Blanche, Cyanosis, Ecchymosis, Mottled, Pallor, Rubor, Erythema. Periwound temperature was noted as No Abnormality. 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 I am using silver collagen in both wounds on the left lateral and right anterior lower extremities 2. 3 layer compression on the left and Curlex and Coban on the right 3. He appears to be tolerating his 1 hour of compression pumps in spite of the known PAD. He has no discomfort Electronic Signature(s) Signed: 08/31/2018 7:17:18 AM By: Linton Ham MD Entered By: Linton Ham on 08/30/2018 16:33:24 Steven Meza ([id number]) -------------------------------------------------------------------------------- SuperBill Details Patient Name: Steven Meza Date of Service: 08/30/2018 Medical Record Number: [id number] Patient Account Number: [id number] Date of Birth/Sex: May 06, 1930 (85 y.o. M) Treating RN: Steven Meza Primary Care Provider: Fulton Meza Other Clinician: Referring Provider: Fulton Meza Treating Provider/Extender: Steven Meza in Treatment: 15 Diagnosis Coding ICD-10 Codes Meza Description [phone] Non-pressure chronic ulcer of left calf with other specified severity I89.0 Lymphedema, not elsewhere classified I70.212 Atherosclerosis of native arteries of extremities with intermittent claudication,  left leg L97.211 Non-pressure chronic ulcer of right calf limited to breakdown of skin Facility Procedures CPT4 Meza: [id number] Description: 99213 - WOUND CARE VISIT-LEV 3 EST  PT Modifier: Quantity: 1 Physician Procedures CPT4 Meza Description: [id number] - WC PHYS LEVEL 3 - EST PT ICD-10 Diagnosis Description L97.228 Non-pressure chronic ulcer of left calf with other specified seve L97.211 Non-pressure chronic ulcer of right calf limited to breakdown of I89.0  Lymphedema, not elsewhere classified I70.212 Atherosclerosis of native arteries of extremities with intermitte Modifier: rity skin nt claudication, l Quantity: 1 eft leg Electronic Signature(s) Signed: 08/31/2018 7:17:18 AM By: Linton Ham MD Entered By: Linton Ham on 08/30/2018 16:33:47

## 2018-09-06 ENCOUNTER — Other Ambulatory Visit: Payer: Self-pay

## 2018-09-06 ENCOUNTER — Encounter: Payer: Medicare Other | Attending: Internal Medicine | Admitting: Internal Medicine

## 2018-09-06 DIAGNOSIS — L97228 Non-pressure chronic ulcer of left calf with other specified severity: Secondary | ICD-10-CM | POA: Insufficient documentation

## 2018-09-06 DIAGNOSIS — I10 Essential (primary) hypertension: Secondary | ICD-10-CM | POA: Diagnosis not present

## 2018-09-06 DIAGNOSIS — Z8551 Personal history of malignant neoplasm of bladder: Secondary | ICD-10-CM | POA: Insufficient documentation

## 2018-09-06 DIAGNOSIS — M1990 Unspecified osteoarthritis, unspecified site: Secondary | ICD-10-CM | POA: Insufficient documentation

## 2018-09-06 DIAGNOSIS — I890 Lymphedema, not elsewhere classified: Secondary | ICD-10-CM | POA: Diagnosis not present

## 2018-09-06 DIAGNOSIS — I70212 Atherosclerosis of native arteries of extremities with intermittent claudication, left leg: Secondary | ICD-10-CM | POA: Insufficient documentation

## 2018-09-06 DIAGNOSIS — L97211 Non-pressure chronic ulcer of right calf limited to breakdown of skin: Secondary | ICD-10-CM | POA: Diagnosis present

## 2018-09-07 NOTE — Progress Notes (Signed)
C[place], YARBRO ([id number]) Visit Report for 09/06/2018 Arrival Information Details Patient Name: Steven Meza, Steven Meza Date of Service: 09/06/2018 3:45 PM Medical Record Number: [id number] Patient Account Number: [id number] Date of Birth/Sex: [birth year] (85 y.o. M) Treating RN: Harold Barban Primary Care Beuford Garcilazo: Fulton Reek Other Clinician: Referring Husein Guedes: Fulton Reek Treating Aldean Suddeth/Extender: Tito Dine in Treatment: 27 Visit Information History Since Last Visit Added or deleted any medications: No Patient Arrived: Cane Any new allergies or adverse reactions: No Arrival Time: 15:53 Had a fall or experienced change in No Accompanied By: self activities of daily living that may affect Transfer Assistance: None risk of falls: Patient Identification Verified: Yes Signs or symptoms of abuse/neglect since last visito No Secondary Verification Process Completed: Yes Hospitalized since last visit: No Has Dressing in Place as Prescribed: Yes Has Compression in Place as Prescribed: Yes Pain Present Now: No Electronic Signature(s) Signed: 09/07/2018 8:05:03 AM By: Harold Barban Entered By: Harold Barban on 09/06/2018 15:55:14 Steven Meza ([id number]) -------------------------------------------------------------------------------- Encounter Discharge Information Details Patient Name: Steven Meza Date of Service: 09/06/2018 3:45 PM Medical Record Number: [id number] Patient Account Number: [id number] Date of Birth/Sex: 10/17/1930 (85 y.o. M) Treating RN: Cornell Barman Primary Care Chasya Zenz: Fulton Reek Other Clinician: Referring Brendia Dampier: Fulton Reek Treating Lillian Tigges/Extender: Tito Dine in Treatment: 40 Encounter Discharge Information Items Post Procedure Vitals Discharge Condition: Stable Unable to obtain vitals Reason: . Ambulatory Status: Ambulatory Discharge Destination: Home Transportation: Ambulance Accompanied By:  self Schedule Follow-up Appointment: Yes Clinical Summary of Care: Electronic Signature(s) Signed: 09/06/2018 5:13:19 PM By: Gretta Cool, BSN, RN, CWS, Kim RN, BSN Entered By: Gretta Cool, BSN, RN, CWS, Kim on 09/06/2018 16:53:19 Steven Meza ([id number]) -------------------------------------------------------------------------------- Lower Extremity Assessment Details Patient Name: Steven Meza Date of Service: 09/06/2018 3:45 PM Medical Record Number: [id number] Patient Account Number: [id number] Date of Birth/Sex: 28-Sep-1930 (85 y.o. M) Treating RN: Harold Barban Primary Care Virlan Kempker: Fulton Reek Other Clinician: Referring Sable Knoles: Fulton Reek Treating Taimi Towe/Extender: Tito Dine in Treatment: 16 Edema Assessment Assessed: [Left: No] Patrice Paradise: No] [Left: Edema] [Right: :] Calf Left: Right: Point of Measurement: 32 cm From Medial Instep 45 cm 41.7 cm Ankle Left: Right: Point of Measurement: 13 cm From Medial Instep 28 cm 28.7 cm Vascular Assessment Pulses: Dorsalis Pedis Palpable: [Left:Yes] [Right:Yes] Posterior Tibial Palpable: [Left:Yes] [Right:Yes] Electronic Signature(s) Signed: 09/07/2018 8:05:03 AM By: Harold Barban Entered By: Harold Barban on 09/06/2018 16:02:30 Steven Meza ([id number]) -------------------------------------------------------------------------------- Multi Wound Chart Details Patient Name: Steven Meza Date of Service: 09/06/2018 3:45 PM Medical Record Number: [id number] Patient Account Number: [id number] Date of Birth/Sex: 13-Jan-1931 (85 y.o. M) Treating RN: Cornell Barman Primary Care Sharanya Templin: Fulton Reek Other Clinician: Referring Parminder Cupples: Fulton Reek Treating Desirea Mizrahi/Extender: Tito Dine in Treatment: 16 Vital Signs Height(in): 71 Pulse(bpm): 83 Weight(lbs): 252 Blood Pressure(mmHg): 151/50 Body Mass Index(BMI): 35 Temperature(F): 97.6 Respiratory Rate 18 (breaths/min): Photos:  [N/A:N/A] Wound Location: Left Lower Leg - Lateral Right Lower Leg - Anterior N/A Wounding Event: Gradually Appeared Gradually Appeared N/A Primary Etiology: Lymphedema Lymphedema N/A Comorbid History: Lymphedema, Coronary Artery Lymphedema, Coronary Artery N/A Disease, Hypertension Disease, Hypertension Date Acquired: 11/14/2017 08/21/2018 N/A Weeks of Treatment: 16 2 N/A Wound Status: Open Open N/A Clustered Wound: Yes No N/A Measurements L x W x D 1.5x0.7x0.2 1.2x0.5x0.1 N/A (cm) Area (cm) : 0.825 0.471 N/A Volume (cm) : 0.165 0.047 N/A % Reduction in Area: 73.70% 61.60% N/A % Reduction in Volume: 47.50% 61.80% N/A Classification: Full Thickness Without Full Thickness Without  N/A Exposed Support Structures Exposed Support Structures Exudate Amount: Medium Medium N/A Exudate Type: Serous Serous N/A Exudate Color: amber amber N/A Wound Margin: Flat and Intact Flat and Intact N/A Granulation Amount: Large (67-100%) Large (67-100%) N/A Granulation Quality: Pink Pink N/A Necrotic Amount: Small (1-33%) None Present (0%) N/A Exposed Structures: Fat Layer (Subcutaneous Fat Layer (Subcutaneous N/A Tissue) Exposed: Yes Tissue) Exposed: Yes Fascia: No Tendon: No Muscle: No SEM, MCCAUGHEY ([id number]) Joint: No Bone: No Epithelialization: None None N/A Debridement: Debridement - Excisional N/A N/A Pre-procedure 16:22 N/A N/A Verification/Time Out Taken: Pain Control: Lidocaine N/A N/A Tissue Debrided: Subcutaneous, Slough N/A N/A Level: Skin/Subcutaneous Tissue N/A N/A Debridement Area (sq cm): 1.05 N/A N/A Instrument: Curette N/A N/A Bleeding: Minimum N/A N/A Hemostasis Achieved: Pressure N/A N/A Debridement Treatment Procedure was tolerated well N/A N/A Response: Post Debridement 1.5x0.7x0.2 N/A N/A Measurements L x W x D (cm) Post Debridement Volume: 0.165 N/A N/A (cm) Periwound Skin Texture: Excoriation: No Excoriation: No N/A Induration: No Induration:  No Callus: No Callus: No Crepitus: No Crepitus: No Rash: No Rash: No Scarring: No Scarring: No Periwound Skin Moisture: Dry/Scaly: Yes Maceration: No N/A Maceration: No Dry/Scaly: No Periwound Skin Color: Hemosiderin Staining: Yes Hemosiderin Staining: Yes N/A Atrophie Blanche: No Atrophie Blanche: No Cyanosis: No Cyanosis: No Ecchymosis: No Ecchymosis: No Erythema: No Erythema: No Mottled: No Mottled: No Pallor: No Pallor: No Rubor: No Rubor: No Temperature: No Abnormality No Abnormality N/A Tenderness on Palpation: No No N/A Procedures Performed: Debridement N/A N/A Treatment Notes Electronic Signature(s) Signed: 09/06/2018 4:51:59 PM By: Linton Ham MD Entered By: Linton Ham on 09/06/2018 16:27:26 Steven Meza ([id number]) -------------------------------------------------------------------------------- Multi-Disciplinary Care Plan Details Patient Name: Steven Meza Date of Service: 09/06/2018 3:45 PM Medical Record Number: [id number] Patient Account Number: [id number] Date of Birth/Sex: Jul 21, 1930 (85 y.o. M) Treating RN: Cornell Barman Primary Care Shirrell Solinger: Fulton Reek Other Clinician: Referring Katherinne Mofield: Fulton Reek Treating Bradrick Kamau/Extender: Tito Dine in Treatment: 16 Active Inactive Orientation to the Wound Care Program Nursing Diagnoses: Knowledge deficit related to the wound healing center program Goals: Patient/caregiver will verbalize understanding of the [place] Date Initiated: 05/17/2018 Target Resolution Date: 06/17/2018 Goal Status: Active Interventions: Provide education on orientation to the wound center Notes: Venous Leg Ulcer Nursing Diagnoses: Potential for venous Insuffiency (use before diagnosis confirmed) Goals: Patient/caregiver will verbalize understanding of disease process and disease management Date Initiated: 05/17/2018 Target Resolution Date: 06/17/2018 Goal Status:  Active Interventions: Assess peripheral edema status every visit. Treatment Activities: Non-invasive vascular studies : 05/17/2018 Notes: Wound/Skin Impairment Nursing Diagnoses: Impaired tissue integrity Goals: Patient/caregiver will verbalize understanding of skin care regimen Date Initiated: 05/17/2018 Target Resolution Date: 05/17/2018 Goal Status: Active DAMARIA, STOFKO ([id number]) Ulcer/skin breakdown will heal within 14 weeks Date Initiated: 05/17/2018 Target Resolution Date: 08/16/2018 Goal Status: Active Interventions: Assess ulceration(s) every visit Treatment Activities: Referred to DME Shavawn Stobaugh for dressing supplies : 05/17/2018 Topical wound management initiated : 05/17/2018 Notes: Electronic Signature(s) Signed: 09/06/2018 5:13:19 PM By: Gretta Cool, BSN, RN, CWS, Kim RN, BSN Entered By: Gretta Cool, BSN, RN, CWS, Kim on 09/06/2018 16:21:16 Steven Meza ([id number]) -------------------------------------------------------------------------------- Pain Assessment Details Patient Name: Steven Meza Date of Service: 09/06/2018 3:45 PM Medical Record Number: [id number] Patient Account Number: [id number] Date of Birth/Sex: 02/14/1931 (85 y.o. M) Treating RN: Harold Barban Primary Care Crescent Gotham: Fulton Reek Other Clinician: Referring Henleigh Robello: Fulton Reek Treating Dorcas Melito/Extender: Tito Dine in Treatment: 16 Active Problems Location of Pain Severity and Description of Pain Patient  Has Paino No Site Locations Pain Management and Medication Current Pain Management: Electronic Signature(s) Signed: 09/07/2018 8:05:03 AM By: Harold Barban Entered By: Harold Barban on 09/06/2018 15:55:24 Steven Meza ([id number]) -------------------------------------------------------------------------------- Patient/Caregiver Education Details Patient Name: Steven Meza Date of Service: 09/06/2018 3:45 PM Medical Record Number: [id number] Patient Account  Number: [id number] Date of Birth/Gender: 05-29-1930 (85 y.o. M) Treating RN: Cornell Barman Primary Care Physician: Fulton Reek Other Clinician: Referring Physician: Fulton Reek Treating Physician/Extender: Tito Dine in Treatment: 16 Education Assessment Education Provided To: Patient Education Topics Provided Wound/Skin Impairment: Handouts: Caring for Your Ulcer Methods: Demonstration, Explain/Verbal Responses: State content correctly Electronic Signature(s) Signed: 09/06/2018 5:13:19 PM By: Gretta Cool, BSN, RN, CWS, Kim RN, BSN Entered By: Gretta Cool, BSN, RN, CWS, Kim on 09/06/2018 16:24:51 Steven Meza ([id number]) -------------------------------------------------------------------------------- Wound Assessment Details Patient Name: Steven Meza Date of Service: 09/06/2018 3:45 PM Medical Record Number: [id number] Patient Account Number: [id number] Date of Birth/Sex: 01-Aug-1930 (85 y.o. M) Treating RN: Harold Barban Primary Care Solae Norling: Fulton Reek Other Clinician: Referring Ronika Kelson: Fulton Reek Treating Zoeann Mol/Extender: Tito Dine in Treatment: 16 Wound Status Wound Number: 1 Primary Lymphedema Etiology: Wound Location: Left Lower Leg - Lateral Wound Status: Open Wounding Event: Gradually Appeared Comorbid Lymphedema, Coronary Artery Disease, Date Acquired: 11/14/2017 History: Hypertension Weeks Of Treatment: 16 Clustered Wound: Yes Photos Wound Measurements Length: (cm) 1.5 Width: (cm) 0.7 Depth: (cm) 0.2 Area: (cm) 0.825 Volume: (cm) 0.165 % Reduction in Area: 73.7% % Reduction in Volume: 47.5% Epithelialization: None Tunneling: No Undermining: No Wound Description Full Thickness Without Exposed Support Foul O Classification: Structures Slough Wound Margin: Flat and Intact Exudate Medium Amount: Exudate Type: Serous Exudate Color: amber dor After Cleansing: No /Fibrino Yes Wound Bed Granulation Amount:  Large (67-100%) Exposed Structure Granulation Quality: Pink Fat Layer (Subcutaneous Tissue) Exposed: Yes Necrotic Amount: Small (1-33%) Necrotic Quality: Adherent Slough Periwound Skin Texture Texture Color No Abnormalities Noted: No No Abnormalities Noted: No AVEION, NGUYEN ([id number]) Callus: No Atrophie Blanche: No Crepitus: No Cyanosis: No Excoriation: No Ecchymosis: No Induration: No Erythema: No Rash: No Hemosiderin Staining: Yes Scarring: No Mottled: No Pallor: No Moisture Rubor: No No Abnormalities Noted: No Dry / Scaly: Yes Temperature / Pain Maceration: No Temperature: No Abnormality Treatment Notes Wound #1 (Left, Lateral Lower Leg) Notes H Blue, Right Kerlix and Coban, Left 3 Layer Electronic Signature(s) Signed: 09/07/2018 8:05:03 AM By: Harold Barban Entered By: Harold Barban on 09/06/2018 16:10:51 Steven Meza ([id number]) -------------------------------------------------------------------------------- Wound Assessment Details Patient Name: Steven Meza Date of Service: 09/06/2018 3:45 PM Medical Record Number: [id number] Patient Account Number: [id number] Date of Birth/Sex: June 07, 1930 (85 y.o. M) Treating RN: Harold Barban Primary Care Paytience Bures: Fulton Reek Other Clinician: Referring Aicia Babinski: Fulton Reek Treating Monserratt Knezevic/Extender: Tito Dine in Treatment: 16 Wound Status Wound Number: 2 Primary Lymphedema Etiology: Wound Location: Right Lower Leg - Anterior Wound Status: Open Wounding Event: Gradually Appeared Comorbid Lymphedema, Coronary Artery Disease, Date Acquired: 08/21/2018 History: Hypertension Weeks Of Treatment: 2 Clustered Wound: No Photos Wound Measurements Length: (cm) 1.2 Width: (cm) 0.5 Depth: (cm) 0.1 Area: (cm) 0.471 Volume: (cm) 0.047 % Reduction in Area: 61.6% % Reduction in Volume: 61.8% Epithelialization: None Tunneling: No Undermining: No Wound Description Full Thickness  Without Exposed Support Foul O Classification: Structures Slough Wound Margin: Flat and Intact Exudate Medium Amount: Exudate Type: Serous Exudate Color: amber dor After Cleansing: No /Fibrino Yes Wound Bed Granulation Amount: Large (67-100%) Exposed Structure Granulation Quality: Pink Fascia Exposed: No Necrotic  Amount: None Present (0%) Fat Layer (Subcutaneous Tissue) Exposed: Yes Tendon Exposed: No Muscle Exposed: No Joint Exposed: No Bone Exposed: No Periwound Skin Texture ZIYAN, SCHOON ([id number]) Texture Color No Abnormalities Noted: No No Abnormalities Noted: No Callus: No Atrophie Blanche: No Crepitus: No Cyanosis: No Excoriation: No Ecchymosis: No Induration: No Erythema: No Rash: No Hemosiderin Staining: Yes Scarring: No Mottled: No Pallor: No Moisture Rubor: No No Abnormalities Noted: No Dry / Scaly: No Temperature / Pain Maceration: No Temperature: No Abnormality Treatment Notes Wound #2 (Right, Anterior Lower Leg) Notes H Blue, Right Kerlix and Coban, Left 3 Layer Electronic Signature(s) Signed: 09/07/2018 8:05:03 AM By: Harold Barban Entered By: Harold Barban on 09/06/2018 16:11:18 Steven Meza ([id number]) -------------------------------------------------------------------------------- Vitals Details Patient Name: Steven Meza Date of Service: 09/06/2018 3:45 PM Medical Record Number: [id number] Patient Account Number: [id number] Date of Birth/Sex: 1930/11/22 (85 y.o. M) Treating RN: Harold Barban Primary Care Anyeli Hockenbury: Fulton Reek Other Clinician: Referring Kaniyah Lisby: Fulton Reek Treating Lavonne Cass/Extender: Tito Dine in Treatment: 16 Vital Signs Time Taken: 15:55 Temperature (F): 97.6 Height (in): 71 Pulse (bpm): 64 Weight (lbs): 252 Respiratory Rate (breaths/min): 18 Body Mass Index (BMI): 35.1 Blood Pressure (mmHg): 151/50 Reference Range: 80 - 120 mg / dl Electronic Signature(s) Signed:  09/07/2018 8:05:03 AM By: Harold Barban Entered By: Harold Barban on 09/06/2018 15:55:50

## 2018-09-07 NOTE — Progress Notes (Signed)
Steven, STGERMAINE ([id number]) Visit Report for 09/06/2018 Debridement Details Patient Name: Steven Meza, Steven Meza Date of Service: 09/06/2018 3:45 PM Medical Record Number: [id number] Patient Account Number: [id number] Date of Birth/Sex: 1931/02/20 (85 y.o. M) Treating RN: Cornell Barman Primary Care Provider: Fulton Reek Other Clinician: Referring Provider: Fulton Reek Treating Provider/Extender: Tito Dine in Treatment: 16 Debridement Performed for Wound #1 Left,Lateral Lower Leg Assessment: Performed By: Physician Ricard Dillon, MD Debridement Type: Debridement Level of Consciousness (Pre- Awake and Alert procedure): Pre-procedure Verification/Time Yes - 16:22 Out Taken: Start Time: 16:22 Pain Control: Lidocaine Total Area Debrided (L x W): 1.5 (cm) x 0.7 (cm) = 1.05 (cm) Tissue and other material Viable, Non-Viable, Slough, Subcutaneous, Slough debrided: Level: Skin/Subcutaneous Tissue Debridement Description: Excisional Instrument: Curette Bleeding: Minimum Hemostasis Achieved: Pressure Response to Treatment: Procedure was tolerated well Level of Consciousness Awake and Alert (Post-procedure): Post Debridement Measurements of Total Wound Length: (cm) 1.5 Width: (cm) 0.7 Depth: (cm) 0.2 Volume: (cm) 0.165 Character of Wound/Ulcer Post Debridement: Stable Post Procedure Diagnosis Same as Pre-procedure Electronic Signature(s) Signed: 09/06/2018 4:51:59 PM By: Linton Ham MD Signed: 09/06/2018 5:13:19 PM By: Gretta Cool, BSN, RN, CWS, Kim RN, BSN Entered By: Linton Ham on 09/06/2018 16:27:50 Steven Meza ([id number]) -------------------------------------------------------------------------------- HPI Details Patient Name: Steven Meza Date of Service: 09/06/2018 3:45 PM Medical Record Number: [id number] Patient Account Number: [id number] Date of Birth/Sex: 02/16/1931 (85 y.o. M) Treating RN: Cornell Barman Primary Care Provider: Fulton Reek  Other Clinician: Referring Provider: Fulton Reek Treating Provider/Extender: Tito Dine in Treatment: 16 History of Present Illness HPI Description: ADMISSION 05/17/2018 [name] is an 85 year old man who is been treated at the lymphedema clinic for a long period with lymphedema wraps for bilateral lower extremity lymphedema. About 6 months ago they noted small open areas on his left lateral calf just above the ankle. These were open. They are apparently putting some form of ointment on this. They have been referred here for our review of this. The patient has a long history of lymphedema and venous stasis. It says in his records that he has recurrent cellulitis although his wife denies this but she states that the lymphedema may have started with cellulitis several years ago. Also in his record there is a history of bladder cancer which they seem to know very little about however he did not have any radiation to the pelvis or anything that could have contributed to lymphedema that they are aware of. There is no prior wound history. The patient has 2 small punched out areas on the left lateral calf that have adherent debris on the surface. Past medical history; lymphedema, hypertension, cellulitis, hearing loss, morbid obesity, osteoarthritis, venous stasis, bladder CA, diverticulosis. ABIs in our clinic were 0.36 on the right and 0.57 on the left 05/24/2018; corrected age on this patient is 55 versus what I said last week. He has been for his arterial studies which predictably are not very good. On the right his ABI is 0.65. Monophasic waveforms noted at the right ankle including the posterior tibial and dorsalis pedis. He has triphasic waveforms of the common femoral and profunda femoris triphasic proximal SFA with monophasic distal FSA and popliteal artery. Monophasic tibial waveforms. On the left his ABI is 0.58 monophasic waveforms at the left ankle. Duplex of the left  lower extremity demonstrated atherosclerotic change with monophasic waveforms throughout the left lower extremity. There was some concern about left iliac disease and potentially femoral-popliteal and tibial disease. The patient  does not describe claudication although according to his wife he over emphasizes his activity. Patient states he is limited by pain in both knees His wounds are on the left lateral calf. 2 small areas with necrotic debris. We have been using silver collagen and light Ace wraps 05/31/2018; wounds on the left lateral calf in the setting of very significant lymphedema and probably PAD. We have been using silver collagen. The base of the small wound looks reasonably improved. I sent him to see Dr. Fletcher Anon however I see now he has an appointment with Dr. Alvester Chou on February 12 2/5; left lateral calf wound looks the same. His wife is doing a good job with her lymphedema wraps and maintaining that the edema. He most likely has PAD and is due to see Dr. Gwenlyn Found of infectious disease on February 12 2/19; left lateral calf wounds look about the same. This looks like wound secondary to chronic venous insufficiency with lymphedema however the patient has very poor arterial studies. We referred him to Dr. Gwenlyn Found. Dr. Gwenlyn Found did not feel he needed to do anything from an arterial point of view. He did not address the question about the aggressiveness of compression. After some thoughts about this I elected to go ahead and put him in 3 layer compression which after all would be less compression then the lymphedema clinic was putting on this. I am hopeful that this should be enough to get some closure of the small wounds 2/26; left lateral calf wound letter this week. He tolerated the 3 layer compression well furthermore he is sleeping in a hospital bed which helps keep his legs up at night. The wound looks better measuring smaller especially in width 3/3; left lateral calf wound about the same  size. The 3 layer compression has really helped get the edema down in his leg however. Using silver collagen 3/11; left lateral calf wound better looking wound surface but about the same size. We have been using 3 layer compression which is really helped get the edema down in the left leg. Even after Dr. Kennon Holter assessment I am reluctant to go to 4 layer compression. He is tolerating 3 layer compression well. We have been using silver collagen NATANAEL, SALADIN ([id number]) 3/18-Patient returns for his left lateral calf wound which overall is looking better, slightly increased in size and dimensions and area, he is tolerating the 3 layer compression, has been dressed with silver collagen which we will continue 3/25oleft lateral calf wound much better looking. Size is smaller. He has been using silver collagen 4/1; gradually getting smaller in size. Using silver collagen with 3 layer compression. I think the patient has some degree of PAD. He saw Dr. Gwenlyn Found in consultation, he did not specifically comment on whether he could tolerate 4 layer compression 4/8; the wound is slightly smaller in depth. We are using 3 layer compression with silver collagen. I had him seen for arterial insufficiency however his ABI was 0.57 in the left leg. Dr. Gwenlyn Found really did not seem to middle on the degree of compression he could tolerate 4/15; the wound is slightly smaller and slightly less deep. We have been using 3 layer compression with silver collagen. There is not an option for 4-layer compression here. 4/22; the patient is making decent progress on his wound on the left lateral calf however he arrives in with a superficial wound on the right anterior tibial area. Were not really sure how this happened. He had been using a stocking on  the right leg. He has been using silver collagen on the wounds Amazing to me he is he appears to have compression pumps at home. I am not really sure I knew this before. In any case  he has not been using them 4/29; left lateral calf wound continues to get gradually smaller. This looks like it is on its way to closure. oThe area on the right anterior tibial area is about the same in terms of size superficial without any depth. This was probably wrap injury oHe tells me that he has been using his compression pumps once a day for 1 hour without any pain 5/6; not as good today. Left lateral calf wound is actually larger and he does not have quite as good edema control. The area on the right anterior tibia is about the same still superficial. He states he is using his compression pumps once a day. We are using 3 layer compression on the left and Curlex Coban on the right Electronic Signature(s) Signed: 09/06/2018 4:51:59 PM By: Linton Ham MD Entered By: Linton Ham on 09/06/2018 16:28:39 Steven Meza ([id number]) -------------------------------------------------------------------------------- Physical Exam Details Patient Name: Steven Meza Date of Service: 09/06/2018 3:45 PM Medical Record Number: [id number] Patient Account Number: [id number] Date of Birth/Sex: Apr 27, 1931 (85 y.o. M) Treating RN: Cornell Barman Primary Care Provider: Fulton Reek Other Clinician: Referring Provider: Fulton Reek Treating Provider/Extender: Tito Dine in Treatment: 87 Constitutional Patient is hypertensive.. Pulse regular and within target range for patient.Marland Kitchen Respirations regular, non-labored and within target range.. Temperature is normal and within the target range for the patient.Marland Kitchen appears in no distress. Eyes Conjunctivae clear. No discharge. Cardiovascular Pedal pulses absent bilaterally.. Edema present in both extremities. This is lymphedema. Lymphatic None palpable in the popliteal area bilaterally. Integumentary (Hair, Skin) No evidence of infection in either area. Psychiatric No evidence of depression, anxiety, or agitation. Calm, cooperative,  and communicative. Appropriate interactions and affect.. Notes Exam; not as good edema control on the left by measurements. oThe area on the left was larger. Covered in adherent necrotic subcutaneous debris removed with a #3 curette. There was minimal bleeding oThe area on the right anterior is about the same. Superficial Electronic Signature(s) Signed: 09/06/2018 4:51:59 PM By: Linton Ham MD Entered By: Linton Ham on 09/06/2018 16:34:42 Steven Meza ([id number]) -------------------------------------------------------------------------------- Physician Orders Details Patient Name: Steven Meza Date of Service: 09/06/2018 3:45 PM Medical Record Number: [id number] Patient Account Number: [id number] Date of Birth/Sex: 03/29/1931 (85 y.o. M) Treating RN: Cornell Barman Primary Care Provider: Fulton Reek Other Clinician: Referring Provider: Fulton Reek Treating Provider/Extender: Tito Dine in Treatment: 55 Verbal / Phone Orders: No Diagnosis Coding Wound Cleansing Wound #1 Left,Lateral Lower Leg o Clean wound with Normal Saline. Wound #2 Right,Anterior Lower Leg o Clean wound with Normal Saline. Anesthetic (add to Medication List) Wound #1 Left,Lateral Lower Leg o Topical Lidocaine 4% cream applied to wound bed prior to debridement (In Clinic Only). Wound #2 Right,Anterior Lower Leg o Topical Lidocaine 4% cream applied to wound bed prior to debridement (In Clinic Only). Primary Wound Dressing Wound #1 Left,Lateral Lower Leg o Hydrafera Blue Ready Transfer Wound #2 Right,Anterior Lower Leg o Hydrafera Blue Ready Transfer Secondary Dressing Wound #1 Left,Lateral Lower Leg o ABD pad Wound #2 Right,Anterior Lower Leg o ABD pad Dressing Change Frequency Wound #1 Left,Lateral Lower Leg o Change dressing every week o Other: - as needed Wound #2 Right,Anterior Lower Leg o Change dressing every week o Other: -  as  needed Follow-up Appointments Wound #1 Left,Lateral Lower Leg o Return Appointment in 1 week. o Nurse Visit as needed - as needed Wound #2 Right,Anterior Lower Leg DALAN, COWGER ([id number]) o Return Appointment in 1 week. o Nurse Visit as needed - as needed Edema Control Wound #1 Left,Lateral Lower Leg o 3 Layer Compression System - Left Lower Extremity o Compression Pump: Use compression pump on left lower extremity for 30 minutes, twice daily. Wound #2 Right,Anterior Lower Leg o 3 Layer Compression System - Left Lower Extremity o Compression Pump: Use compression pump on left lower extremity for 30 minutes, twice daily. o Kerlix and Coban - Right Lower Extremity o Compression Pump: Use compression pump on right lower extremity for 30 minutes, twice daily. Electronic Signature(s) Signed: 09/06/2018 4:51:59 PM By: Linton Ham MD Signed: 09/06/2018 5:13:19 PM By: Gretta Cool, BSN, RN, CWS, Kim RN, BSN Entered By: Gretta Cool, BSN, RN, CWS, Kim on 09/06/2018 16:24:13 Steven Meza ([id number]) -------------------------------------------------------------------------------- Problem List Details Patient Name: Steven Meza Date of Service: 09/06/2018 3:45 PM Medical Record Number: [id number] Patient Account Number: [id number] Date of Birth/Sex: 09/14/1930 (85 y.o. 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 M) Treating RN: Cornell Barman Primary Care Provider: Fulton Reek Other Clinician: Referring Provider: Fulton Reek Treating Provider/Extender: Tito Dine in Treatment: 16 Subjective History of Present Illness (HPI) ADMISSION 05/17/2018 Mr. Chagoya is an 85 year old man who is been treated at the lymphedema clinic for a long period with lymphedema wraps for bilateral lower extremity lymphedema. About 6 months ago they noted small open areas on his left lateral calf just above the ankle. These were open. They are apparently putting some form of ointment on this. They have been referred here for our review of this. The patient has a long history of lymphedema and venous stasis. It says in his records that he has recurrent cellulitis although his wife denies this but she states that the lymphedema may have started with cellulitis several years ago. Also in his record there is a history of bladder cancer which they seem to know very little about however he did not have any radiation to the pelvis or anything that could have contributed to lymphedema that they are aware of. There is no prior wound history. The patient has 2 small punched out areas on the left lateral calf that have adherent debris on the surface. Past medical history; lymphedema, hypertension, cellulitis, hearing loss, morbid obesity, osteoarthritis, venous stasis, bladder CA, diverticulosis. ABIs in our clinic were  0.36 on the right and 0.57 on the left 05/24/2018; corrected age on this patient is 70 versus what I said last week. He has been for his arterial studies which predictably  are not very good. On the right his ABI is 0.65. Monophasic waveforms noted at the right ankle including the posterior tibial and dorsalis pedis. He has triphasic waveforms of the common femoral and profunda femoris triphasic proximal SFA with monophasic distal FSA and popliteal artery. Monophasic tibial waveforms. On the left his ABI is 0.58 monophasic waveforms at the left ankle. Duplex of the left lower extremity demonstrated atherosclerotic change with monophasic waveforms throughout the left lower extremity. There was some concern about left iliac disease and potentially femoral-popliteal and tibial disease. The patient does not describe claudication although according to his wife he over emphasizes his activity. Patient states he is limited by pain in both knees His wounds are on the left lateral calf. 2 small areas with necrotic debris. We have been using silver collagen and light Ace wraps 05/31/2018; wounds on the left lateral calf in the setting of very significant lymphedema and probably PAD. We have been using silver collagen. The base of the small wound looks reasonably improved. I sent him to see Dr. Fletcher Anon however I see now he has an appointment with Dr. Alvester Chou on February 12 2/5; left lateral calf wound looks the same. His wife is doing a good job with her lymphedema wraps and maintaining that the edema. He most likely has PAD and is due to see Dr. Gwenlyn Found of infectious disease on February 12 2/19; left lateral calf wounds look about the same. This looks like wound secondary to chronic venous insufficiency with lymphedema however the patient has very poor arterial studies. We referred him to Dr. Gwenlyn Found. Dr. Gwenlyn Found did not feel he needed to do anything from an arterial point of view. He did not address the question about  the aggressiveness of compression. After some thoughts about this I elected to go ahead and put him in 3 layer compression which after all would be less compression then the lymphedema clinic was putting on this. I am hopeful that this should be enough to get some closure of the small wounds 2/26; left lateral calf wound letter this week. He tolerated the 3 layer compression well furthermore he is sleeping in a hospital bed which helps keep his legs up at night. The wound looks better measuring smaller especially in width 3/3; left lateral calf wound about the same size. The 3 layer compression has really helped get the edema down in his leg however. Using silver collagen 3/11; left lateral calf wound better looking wound surface but about the same size. We have been using 3 layer compression which is really helped get the edema down in the left leg. Even after Dr. Kennon Holter assessment I am reluctant to go to 4 layer EDKER, PUNT A. ([id number]) compression. He is tolerating 3 layer compression well. We have been using silver collagen 3/18-Patient returns for his left lateral calf wound which overall is looking better, slightly increased in size and dimensions and area, he is tolerating the 3 layer compression, has been dressed with silver collagen which we will continue 3/25 left lateral calf wound much better looking. Size is smaller. He has been using silver collagen 4/1; gradually getting smaller in size. Using silver collagen with 3 layer compression. I think the patient has some degree of PAD. He saw Dr. Gwenlyn Found in consultation, he did not specifically comment on whether he could tolerate 4 layer compression 4/8; the wound is slightly smaller in depth. We are using 3 layer compression with silver collagen. I had him seen for arterial insufficiency however  his ABI was 0.57 in the left leg. Dr. Gwenlyn Found really did not seem to middle on the degree of compression he could tolerate 4/15; the wound is  slightly smaller and slightly less deep. We have been using 3 layer compression with silver collagen. There is not an option for 4-layer compression here. 4/22; the patient is making decent progress on his wound on the left lateral calf however he arrives in with a superficial wound on the right anterior tibial area. Were not really sure how this happened. He had been using a stocking on the right leg. He has been using silver collagen on the wounds Amazing to me he is he appears to have compression pumps at home. I am not really sure I knew this before. In any case he has not been using them 4/29; left lateral calf wound continues to get gradually smaller. This looks like it is on its way to closure. The area on the right anterior tibial area is about the same in terms of size superficial without any depth. This was probably wrap injury He tells me that he has been using his compression pumps once a day for 1 hour without any pain 5/6; not as good today. Left lateral calf wound is actually larger and he does not have quite as good edema control. The area on the right anterior tibia is about the same still superficial. He states he is using his compression pumps once a day. We are using 3 layer compression on the left and Curlex Coban on the right Objective Constitutional Patient is hypertensive.. Pulse regular and within target range for patient.Marland Kitchen Respirations regular, non-labored and within target range.. Temperature is normal and within the target range for the patient.Marland Kitchen appears in no distress. Vitals Time Taken: 3:55 PM, Height: 71 in, Weight: 252 lbs, BMI: 35.1, Temperature: 97.6 F, Pulse: 64 bpm, Respiratory Rate: 18 breaths/min, Blood Pressure: 151/50 mmHg. Eyes Conjunctivae clear. No discharge. Cardiovascular Pedal pulses absent bilaterally.. Edema present in both extremities. This is lymphedema. Lymphatic None palpable in the popliteal area bilaterally. Psychiatric No evidence  of depression, anxiety, or agitation. Calm, cooperative, and communicative. Appropriate interactions and affect.. General Notes: Exam; not as good edema control on the left by measurements. The area on the left was larger. Covered in adherent necrotic subcutaneous debris removed with a #3 curette. There was minimal bleeding The area on the right anterior is about the same. Superficial VALDIS, BEVILL ([id number]) Integumentary (Hair, Skin) No evidence of infection in either area. Wound #1 status is Open. Original cause of wound was Gradually Appeared. The wound is located on the Left,Lateral Lower Leg. The wound measures 1.5cm length x 0.7cm width x 0.2cm depth; 0.825cm^2 area and 0.165cm^3 volume. There is Fat Layer (Subcutaneous Tissue) Exposed exposed. There is no tunneling or undermining noted. There is a medium amount of serous drainage noted. The wound margin is flat and intact. There is large (67-100%) pink granulation within the wound bed. There is a small (1-33%) amount of necrotic tissue within the wound bed including Adherent Slough. The periwound skin appearance exhibited: Dry/Scaly, Hemosiderin Staining. The periwound skin appearance did not exhibit: Callus, Crepitus, Excoriation, Induration, Rash, Scarring, Maceration, Atrophie Blanche, Cyanosis, Ecchymosis, Mottled, Pallor, Rubor, Erythema. Periwound temperature was noted as No Abnormality. Wound #2 status is Open. Original cause of wound was Gradually Appeared. The wound is located on the Right,Anterior Lower Leg. The wound measures 1.2cm length x 0.5cm width x 0.1cm depth; 0.471cm^2 area and 0.047cm^3 volume. There  is Fat Layer (Subcutaneous Tissue) Exposed exposed. There is no tunneling or undermining noted. There is a medium amount of serous drainage noted. The wound margin is flat and intact. There is large (67-100%) pink granulation within the wound bed. There is no necrotic tissue within the wound bed. The periwound skin  appearance exhibited: Hemosiderin Staining. The periwound skin appearance did not exhibit: Callus, Crepitus, Excoriation, Induration, Rash, Scarring, Dry/Scaly, Maceration, Atrophie Blanche, Cyanosis, Ecchymosis, Mottled, Pallor, Rubor, Erythema. Periwound temperature was noted as No Abnormality. 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 There was a Excisional Skin/Subcutaneous Tissue Debridement with a total area of 1.05 sq cm performed by Ricard Dillon, MD. With the following instrument(s): Curette to remove Viable and Non-Viable tissue/material. Material removed includes Subcutaneous Tissue and Slough and after achieving pain control using Lidocaine. No specimens were taken. A time out was conducted at 16:22, prior to the start of the procedure. A Minimum amount of bleeding was controlled with Pressure. The procedure was tolerated well. Post Debridement Measurements: 1.5cm length x 0.7cm width x 0.2cm depth; 0.165cm^3 volume. Character of Wound/Ulcer Post Debridement is stable. Post procedure Diagnosis Wound #1: Same as Pre-Procedure OTHAR, CURTO ([id number]) Plan Wound Cleansing: Wound #1 Left,Lateral Lower Leg: Clean wound with Normal Saline. Wound #2 Right,Anterior Lower Leg: Clean wound with  Normal Saline. Anesthetic (add to Medication List): Wound #1 Left,Lateral Lower Leg: Topical Lidocaine 4% cream applied to wound bed prior to debridement (In Clinic Only). Wound #2 Right,Anterior Lower Leg: Topical Lidocaine 4% cream applied to wound bed prior to debridement (In Clinic Only). Primary Wound Dressing: Wound #1 Left,Lateral Lower Leg: Hydrafera Blue Ready Transfer Wound #2 Right,Anterior Lower Leg: Hydrafera Blue Ready Transfer Secondary Dressing: Wound #1 Left,Lateral Lower Leg: ABD pad Wound #2 Right,Anterior Lower Leg: ABD pad Dressing Change Frequency: Wound #1 Left,Lateral Lower Leg: Change dressing every week Other: - as needed Wound #2 Right,Anterior Lower Leg: Change dressing every week Other: - as needed Follow-up Appointments: Wound #1 Left,Lateral Lower Leg: Return Appointment in 1 week. Nurse Visit as needed - as needed Wound #2 Right,Anterior Lower Leg: Return Appointment in 1 week. Nurse Visit as needed - as needed Edema Control: Wound #1 Left,Lateral Lower Leg: 3 Layer Compression System - Left Lower Extremity Compression Pump: Use compression pump on left lower extremity for 30 minutes, twice daily. Wound #2 Right,Anterior Lower Leg: 3 Layer Compression System - Left Lower Extremity Compression Pump: Use compression pump on left lower extremity for 30 minutes, twice daily. Kerlix and Coban - Right Lower Extremity Compression Pump: Use compression pump on right lower extremity for 30 minutes, twice daily. 1. Change the primary dressing to Hydrofera Blue 2. There is no option but to continue the same compression 3 layer on the left 2 layer on the right. He is using his compression pumps on top of the 3. Fine-line between edema control and his underlying PAD thinks this is difficult JOENATHAN, SAKUMA ([id number]) Electronic  Signature(s) Signed: 09/06/2018 4:51:59 PM By: Linton Ham MD Entered By: Linton Ham on 09/06/2018 16:35:19 Steven Meza ([id number]) -------------------------------------------------------------------------------- SuperBill Details Patient Name: Steven Meza Date of Service: 09/06/2018 Medical Record Number: [id number] Patient Account Number: [id number] Date of Birth/Sex: 06/18/1930 (85 y.o. 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 Facility Procedures CPT4 Meza: [id number] Description: 51884 - DEB SUBQ TISSUE 20 SQ CM/< ICD-10 Diagnosis Description L97.228 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

## 2018-09-13 ENCOUNTER — Encounter: Payer: Medicare Other | Admitting: Internal Medicine

## 2018-09-13 ENCOUNTER — Other Ambulatory Visit: Payer: Self-pay

## 2018-09-13 DIAGNOSIS — I70212 Atherosclerosis of native arteries of extremities with intermittent claudication, left leg: Secondary | ICD-10-CM | POA: Diagnosis not present

## 2018-09-14 NOTE — Progress Notes (Signed)
Steven Meza, Steven Meza ([id number]) Visit Report for 09/13/2018 HPI Details Patient Name: Steven Meza, Steven Meza Date of Service: 09/13/2018 3:45 PM Medical Record Number: [id number] Patient Account Number: [id number] Date of Birth/Sex: 1930-12-23 (85 y.o. M) Treating RN: Cornell Barman Primary Care Provider: Fulton Reek Other Clinician: Referring Provider: Fulton Reek Treating Provider/Extender: Tito Dine in Treatment: 17 History of Present Illness HPI Description: ADMISSION 05/17/2018 Mr. Peddie is an 85 year old man who is been treated at the lymphedema clinic for a long period with lymphedema wraps for bilateral lower extremity lymphedema. About 6 months ago they noted small open areas on his left lateral calf just above the ankle. These were open. They are apparently putting some form of ointment on this. They have been referred here for our review of this. The patient has a long history of lymphedema and venous stasis. It says in his records that he has recurrent cellulitis although his wife denies this but she states that the lymphedema may have started with cellulitis several years ago. Also in his record there is a history of bladder cancer which they seem to know very little about however he did not have any radiation to the pelvis or anything that could have contributed to lymphedema that they are aware of. There is no prior wound history. The patient has 2 small punched out areas on the left lateral calf that have adherent debris on the surface. Past medical history; lymphedema, hypertension, cellulitis, hearing loss, morbid obesity, osteoarthritis, venous stasis, bladder CA, diverticulosis. ABIs in our clinic were 0.36 on the right and 0.57 on the left 05/24/2018; corrected age on this patient is 19 versus what I said last week. He has been for his arterial studies which predictably are not very good. On the right his ABI is 0.65. Monophasic waveforms noted at the right ankle  including the posterior tibial and dorsalis pedis. He has triphasic waveforms of the common femoral and profunda femoris triphasic proximal SFA with monophasic distal FSA and popliteal artery. Monophasic tibial waveforms. On the left his ABI is 0.58 monophasic waveforms at the left ankle. Duplex of the left lower extremity demonstrated atherosclerotic change with monophasic waveforms throughout the left lower extremity. There was some concern about left iliac disease and potentially femoral-popliteal and tibial disease. The patient does not describe claudication although according to his wife he over emphasizes his activity. Patient states he is limited by pain in both knees His wounds are on the left lateral calf. 2 small areas with necrotic debris. We have been using silver collagen and light Ace wraps 05/31/2018; wounds on the left lateral calf in the setting of very significant lymphedema and probably PAD. We have been using silver collagen. The base of the small wound looks reasonably improved. I sent him to see Dr. Fletcher Anon however I see now he has an appointment with Dr. Alvester Chou on February 12 2/5; left lateral calf wound looks the same. His wife is doing a good job with her lymphedema wraps and maintaining that the edema. He most likely has PAD and is due to see Dr. Gwenlyn Found of infectious disease on February 12 2/19; left lateral calf wounds look about the same. This looks like wound secondary to chronic venous insufficiency with lymphedema however the patient has very poor arterial studies. We referred him to Dr. Gwenlyn Found. Dr. Gwenlyn Found did not feel he needed to do anything from an arterial point of view. He did not address the question about the aggressiveness of compression. After some thoughts about  this I elected to go ahead and put him in 3 layer compression which after all would be less compression then the lymphedema clinic was putting on this. I am hopeful that this should be enough to get  some closure of the small wounds 2/26; left lateral calf wound letter this week. He tolerated the 3 layer compression well furthermore he is sleeping in a hospital bed which helps keep his legs up at night. The wound looks better measuring smaller especially in width 3/3; left lateral calf wound about the same size. The 3 layer compression has really helped get the edema down in his leg Steven Meza, Steven A. ([id number]) however. Using silver collagen 3/11; left lateral calf wound better looking wound surface but about the same size. We have been using 3 layer compression which is really helped get the edema down in the left leg. Even after Dr. Kennon Holter assessment I am reluctant to go to 4 layer compression. He is tolerating 3 layer compression well. We have been using silver collagen 3/18-Patient returns for his left lateral calf wound which overall is looking better, slightly increased in size and dimensions and area, he is tolerating the 3 layer compression, has been dressed with silver collagen which we will continue 3/25oleft lateral calf wound much better looking. Size is smaller. He has been using silver collagen 4/1; gradually getting smaller in size. Using silver collagen with 3 layer compression. I think the patient has some degree of PAD. He saw Dr. Gwenlyn Found in consultation, he did not specifically comment on whether he could tolerate 4 layer compression 4/8; the wound is slightly smaller in depth. We are using 3 layer compression with silver collagen. I had him seen for arterial insufficiency however his ABI was 0.57 in the left leg. Dr. Gwenlyn Found really did not seem to middle on the degree of compression he could tolerate 4/15; the wound is slightly smaller and slightly less deep. We have been using 3 layer compression with silver collagen. There is not an option for 4-layer compression here. 4/22; the patient is making decent progress on his wound on the left lateral calf however he arrives in  with a superficial wound on the right anterior tibial area. Were not really sure how this happened. He had been using a stocking on the right leg. He has been using silver collagen on the wounds Amazing to me he is he appears to have compression pumps at home. I am not really sure I knew this before. In any case he has not been using them 4/29; left lateral calf wound continues to get gradually smaller. This looks like it is on its way to closure. oThe area on the right anterior tibial area is about the same in terms of size superficial without any depth. This was probably wrap injury oHe tells me that he has been using his compression pumps once a Steven Meza for 1 hour without any pain 5/6; not as good today. Left lateral calf wound is actually larger and he does not have quite as good edema control. The area on the right anterior tibia is about the same still superficial. He states he is using his compression pumps once a Steven Meza. We are using 3 layer compression on the left and Curlex Coban on the right 5/13; the area on the right is very close to being healed. The area on the left still has some depth but has a healthy wound surface. He has excellent edema control bilaterally. Using [facility] Electronic Signature(s)  Signed: 09/13/2018 4:30:12 PM By: Linton Ham MD Entered By: Linton Ham on 09/13/2018 16:18:32 Steven Meza ([id number]) -------------------------------------------------------------------------------- Physical Exam Details Patient Name: Steven Meza Date of Service: 09/13/2018 3:45 PM Medical Record Number: [id number] Patient Account Number: [id number] Date of Birth/Sex: 1930/07/28 (85 y.o. M) Treating RN: Cornell Barman Primary Care Provider: Fulton Reek Other Clinician: Referring Provider: Fulton Reek Treating Provider/Extender: Tito Dine in Treatment: 82 Constitutional Patient is hypertensive.. Pulse regular and within target range for  patient.Marland Kitchen Respirations regular, non-labored and within target range.. Temperature is normal and within the target range for the patient.Marland Kitchen appears in no distress. Eyes Conjunctivae clear. No discharge. Respiratory Respiratory effort is easy and symmetric bilaterally. Rate is normal at rest and on room air.. Cardiovascular Pedal pulses absent bilaterally.. Excellent edema control today. Integumentary (Hair, Skin) Changes of chronic venous insufficiency with lymphedema. Edema is under excellent control. Psychiatric No evidence of depression, anxiety, or agitation. Calm, cooperative, and communicative. Appropriate interactions and affect.. Notes Wound exam; edema control is a lot better this week. The area on the left slightly smaller but still with some depth. The area on the right is just about healed. Electronic Signature(s) Signed: 09/13/2018 4:30:12 PM By: Linton Ham MD Entered By: Linton Ham on 09/13/2018 16:19:55 Steven Meza ([id number]) -------------------------------------------------------------------------------- Physician Orders Details Patient Name: Steven Meza Date of Service: 09/13/2018 3:45 PM Medical Record Number: [id number] Patient Account Number: [id number] Date of Birth/Sex: 10-31-1930 (85 y.o. M) Treating RN: Cornell Barman Primary Care Provider: Fulton Reek Other Clinician: Referring Provider: Fulton Reek Treating Provider/Extender: Tito Dine in Treatment: 13 Verbal / Phone Orders: No Diagnosis Coding Wound Cleansing Wound #1 Left,Lateral Lower Leg o Clean wound with Normal Saline. Wound #2 Right,Anterior Lower Leg o Clean wound with Normal Saline. Anesthetic (add to Medication List) Wound #1 Left,Lateral Lower Leg o Topical Lidocaine 4% cream applied to wound bed prior to debridement (In Clinic Only). Wound #2 Right,Anterior Lower Leg o Topical Lidocaine 4% cream applied to wound bed prior to debridement (In Clinic  Only). Primary Wound Dressing Wound #1 Left,Lateral Lower Leg o Hydrafera Blue Ready Transfer Wound #2 Right,Anterior Lower Leg o Hydrafera Blue Ready Transfer Secondary Dressing Wound #1 Left,Lateral Lower Leg o ABD pad Wound #2 Right,Anterior Lower Leg o ABD pad Dressing Change Frequency Wound #1 Left,Lateral Lower Leg o Change dressing every week o Other: - as needed Wound #2 Right,Anterior Lower Leg o Change dressing every week o Other: - as needed Follow-up Appointments Wound #1 Left,Lateral Lower Leg o Return Appointment in 1 week. o Nurse Visit as needed - as needed Wound #2 Right,Anterior Lower Leg Steven Meza, Steven Meza ([id number]) o Return Appointment in 1 week. o Nurse Visit as needed - as needed Edema Control Wound #1 Left,Lateral Lower Leg o 3 Layer Compression System - Left Lower Extremity o Compression Pump: Use compression pump on left lower extremity for 30 minutes, twice daily. Wound #2 Right,Anterior Lower Leg o Kerlix and Coban - Right Lower Extremity o 3 Layer Compression System - Left Lower Extremity o Compression Pump: Use compression pump on left lower extremity for 30 minutes, twice daily. o Compression Pump: Use compression pump on right lower extremity for 30 minutes, twice daily. Electronic Signature(s) Signed: 09/13/2018 4:30:12 PM By: Linton Ham MD Signed: 09/14/2018 10:03:11 AM By: Gretta Cool, BSN, RN, CWS, Kim RN, BSN Entered By: Gretta Cool, BSN, RN, CWS, Kim on 09/13/2018 16:05:08 Steven Meza ([id number]) -------------------------------------------------------------------------------- Problem List Details Patient Name: KELSON, QUEENAN.  Date of Service: 09/13/2018 3:45 PM Medical Record Number: [id number] Patient Account Number: [id number] Date of Birth/Sex: [birth year] (85 y.o. 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 M) Treating RN: Cornell Barman Primary Care Provider: Fulton Reek Other Clinician: Referring Provider: Fulton Reek Treating Provider/Extender: Tito Dine in Treatment: 17 Subjective History of Present Illness (HPI) ADMISSION 05/17/2018 Mr. Kanady is an 85 year old man who is been treated at the lymphedema clinic for a long period with lymphedema wraps for bilateral lower extremity lymphedema. About 6 months ago they noted small open areas on his left lateral calf just above the ankle. These were open. They are apparently putting some form of ointment on this. They have been referred here for our review of this. The patient has a long history of lymphedema and venous stasis. It says in his records that he has recurrent cellulitis although his wife denies this but she states that the lymphedema may have started with cellulitis several years ago. Also in his record there is a history of bladder cancer  which they seem to know very little about however he did not have any radiation to the pelvis or anything that could have contributed to lymphedema that they are aware of. There is no prior wound history. The patient has 2 small punched out areas on the left lateral calf that have adherent debris on the surface. Past medical history; lymphedema, hypertension, cellulitis, hearing loss, morbid obesity, osteoarthritis, venous stasis, bladder CA, diverticulosis. ABIs in our clinic were 0.36 on the right and 0.57 on the left 05/24/2018; corrected age on this patient is 79 versus what I said last week. He has been for his arterial studies which predictably are not very good. On the right his ABI is 0.65. Monophasic waveforms noted at the right ankle including the posterior tibial and dorsalis pedis. He has triphasic waveforms of the common femoral and profunda femoris triphasic proximal SFA with monophasic distal FSA and popliteal artery. Monophasic tibial waveforms. On the left his ABI is 0.58 monophasic waveforms at the left ankle. Duplex of the left lower extremity demonstrated atherosclerotic change with monophasic waveforms throughout the left lower extremity. There was some concern about left iliac disease and potentially femoral-popliteal and tibial disease. The patient does not describe claudication although according to his wife he over emphasizes his activity. Patient states he is limited by pain in both knees His wounds are on the left lateral calf. 2 small areas with necrotic debris. We have been using silver collagen and light Ace wraps 05/31/2018; wounds on the left lateral calf in the setting of very significant lymphedema and probably PAD. We have been using silver collagen. The base of the small wound looks reasonably improved.  I sent him to see Dr. Fletcher Anon however I see now he has an appointment with Dr. Alvester Chou on February 12 2/5; left lateral calf wound looks the same. His wife is doing a  good job with her lymphedema wraps and maintaining that the edema. He most likely has PAD and is due to see Dr. Gwenlyn Found of infectious disease on February 12 2/19; left lateral calf wounds look about the same. This looks like wound secondary to chronic venous insufficiency with lymphedema however the patient has very poor arterial studies. We referred him to Dr. Gwenlyn Found. Dr. Gwenlyn Found did not feel he needed to do anything from an arterial point of view. He did not address the question about the aggressiveness of compression. After some thoughts about this I elected to go ahead and put him in 3 layer compression which after all would be less compression then the lymphedema clinic was putting on this. I am hopeful that this should be enough to get some closure of the small wounds 2/26; left lateral calf wound letter this week. He tolerated the 3 layer compression well furthermore he is sleeping in a hospital bed which helps keep his legs up at night. The wound looks better measuring smaller especially in width 3/3; left lateral calf wound about the same size. The 3 layer compression has really helped get the edema down in his leg however. Using silver collagen 3/11; left lateral calf wound better looking wound surface but about the same size. We have been using 3 layer compression which is really helped get the edema down in the left leg. Even after Dr. Kennon Holter assessment I am reluctant to go to 4 layer Steven Meza, Steven A. ([id number]) compression. He is tolerating 3 layer compression well. We have been using silver collagen 3/18-Patient returns for his left lateral calf wound which overall is looking better, slightly increased in size and dimensions and area, he is tolerating the 3 layer compression, has been dressed with silver collagen which we will continue 3/25 left lateral calf wound much better looking. Size is smaller. He has been using silver collagen 4/1; gradually getting smaller in size. Using  silver collagen with 3 layer compression. I think the patient has some degree of PAD. He saw Dr. Gwenlyn Found in consultation, he did not specifically comment on whether he could tolerate 4 layer compression 4/8; the wound is slightly smaller in depth. We are using 3 layer compression with silver collagen. I had him seen for arterial insufficiency however his ABI was 0.57 in the left leg. Dr. Gwenlyn Found really did not seem to middle on the degree of compression he could tolerate 4/15; the wound is slightly smaller and slightly less deep. We have been using 3 layer compression with silver collagen. There is not an option for 4-layer compression here. 4/22; the patient is making decent progress on his wound on the left lateral calf however he arrives in with a superficial wound on the right anterior tibial area. Were not really sure how this happened. He had been using a stocking on the right leg. He has been using silver collagen on the wounds Amazing to me he is he appears to have compression pumps at home. I am not really sure I knew this before. In any case he has not been using them 4/29; left lateral calf wound continues to get gradually smaller. This looks like it is on its way to closure. The area on the right anterior tibial area is about the same in terms of  size superficial without any depth. This was probably wrap injury He tells me that he has been using his compression pumps once a Steven Meza for 1 hour without any pain 5/6; not as good today. Left lateral calf wound is actually larger and he does not have quite as good edema control. The area on the right anterior tibia is about the same still superficial. He states he is using his compression pumps once a Steven Meza. We are using 3 layer compression on the left and Curlex Coban on the right 5/13; the area on the right is very close to being healed. The area on the left still has some depth but has a healthy wound surface. He has excellent edema control  bilaterally. Using Hydrofera Blue Objective Constitutional Patient is hypertensive.. Pulse regular and within target range for patient.Marland Kitchen Respirations regular, non-labored and within target range.. Temperature is normal and within the target range for the patient.Marland Kitchen appears in no distress. Vitals Time Taken: 3:35 PM, Height: 71 in, Weight: 252 lbs, BMI: 35.1, Temperature: 98.2 F, Pulse: 89 bpm, Respiratory Rate: 18 breaths/min, Blood Pressure: 159/71 mmHg. Eyes Conjunctivae clear. No discharge. Respiratory Respiratory effort is easy and symmetric bilaterally. Rate is normal at rest and on room air.. Cardiovascular Pedal pulses absent bilaterally.. Excellent edema control today. Psychiatric No evidence of depression, anxiety, or agitation. Calm, cooperative, and communicative. Appropriate interactions and affect.. General Notes: Wound exam; edema control is a lot better this week. The area on the left slightly smaller but still with some Steven Meza, Steven Meza. ([id number]) depth. The area on the right is just about healed. Integumentary (Hair, Skin) Changes of chronic venous insufficiency with lymphedema. Edema is under excellent control. Wound #1 status is Open. Original cause of wound was Gradually Appeared. The wound is located on the Left,Lateral Lower Leg. The wound measures 0.9cm length x 0.5cm width x 0.2cm depth; 0.353cm^2 area and 0.071cm^3 volume. There is Fat Layer (Subcutaneous Tissue) Exposed exposed. There is no tunneling or undermining noted. There is a medium amount of serous drainage noted. The wound margin is flat and intact. There is large (67-100%) pink granulation within the wound bed. There is a small (1-33%) amount of necrotic tissue within the wound bed including Adherent Slough. The periwound skin appearance exhibited: Dry/Scaly, Hemosiderin Staining. The periwound skin appearance did not exhibit: Callus, Crepitus, Excoriation, Induration, Rash, Scarring, Maceration,  Atrophie Blanche, Cyanosis, Ecchymosis, Mottled, Pallor, Rubor, Erythema. Periwound temperature was noted as No Abnormality. Wound #2 status is Open. Original cause of wound was Gradually Appeared. The wound is located on the Right,Anterior Lower Leg. The wound measures 0.3cm length x 0.2cm width x 0.1cm depth; 0.047cm^2 area and 0.005cm^3 volume. There is Fat Layer (Subcutaneous Tissue) Exposed exposed. There is no tunneling or undermining noted. There is a small amount of serous drainage noted. The wound margin is flat and intact. There is large (67-100%) pink granulation within the wound bed. There is no necrotic tissue within the wound bed. The periwound skin appearance exhibited: Hemosiderin Staining. The periwound skin appearance did not exhibit: Callus, Crepitus, Excoriation, Induration, Rash, Scarring, Dry/Scaly, Maceration, Atrophie Blanche, Cyanosis, Ecchymosis, Mottled, Pallor, Rubor, Erythema. Periwound temperature was noted as No Abnormality. 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 There was a Three Layer Compression Therapy Procedure with a pre-treatment ABI of 0.6 by Cornell Barman, RN. Post procedure Diagnosis Wound #1: Same as Pre-Procedure Steven Meza, Steven Meza ([id number]) Plan Wound  Cleansing: Wound #1 Left,Lateral Lower Leg: Clean wound with Normal Saline. Wound #2 Right,Anterior Lower Leg: Clean wound with Normal Saline. Anesthetic (add to Medication List): Wound #1 Left,Lateral Lower Leg: Topical Lidocaine 4% cream applied to wound bed prior to debridement (In Clinic Only). Wound #2 Right,Anterior Lower Leg: Topical Lidocaine 4% cream applied to wound bed prior to debridement (In Clinic Only). Primary Wound Dressing: Wound #1 Left,Lateral Lower Leg: Hydrafera Blue Ready Transfer Wound #2 Right,Anterior Lower Leg: Hydrafera Blue Ready Transfer Secondary Dressing: Wound #1 Left,Lateral Lower Leg: ABD pad Wound #2 Right,Anterior Lower Leg: ABD pad Dressing Change Frequency: Wound #1 Left,Lateral Lower Leg: Change dressing every week Other: - as needed Wound #2 Right,Anterior Lower Leg: Change dressing every week Other: - as needed Follow-up Appointments: Wound #1 Left,Lateral Lower Leg: Return Appointment in 1 week. Nurse Visit as needed - as needed Wound #2 Right,Anterior Lower Leg: Return Appointment in 1 week. Nurse Visit as needed - as needed Edema Control: Wound #1 Left,Lateral Lower Leg: 3 Layer Compression System - Left Lower Extremity Compression Pump: Use compression pump on left lower extremity for 30 minutes, twice daily. Wound #2 Right,Anterior Lower Leg: Kerlix and Coban - Right Lower Extremity 3 Layer Compression System - Left Lower Extremity Compression Pump: Use compression pump on left lower extremity for 30 minutes, twice daily. Compression Pump: Use compression pump on right lower extremity for 30 minutes, twice daily. 1. Continue with Hydrofera Blue with 3 layer compression on the left, Kerlix and Coban on the right 2. He uses his compression pumps at home 1 hour once a Steven Meza. 3. There is not an option for more aggressive compression given the patient's known PAD. For now he seems to be tolerating the wraps we put on with the use  of the compression pumps Steven Meza, Steven Meza ([id number]) Electronic Signature(s) Signed: 09/13/2018 4:30:12 PM By: Linton Ham MD Entered By: Linton Ham on 09/13/2018 16:28:41 Steven Meza ([id number]) -------------------------------------------------------------------------------- SuperBill Details Patient Name: Steven Meza Date of Service: 09/13/2018 Medical Record Number: [id number] Patient Account Number: [id number] Date of Birth/Sex: [birth year] (85 y.o. 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 Facility Procedures CPT4 Meza: [id number] Description: (Facility Use Only) 29581LT - APPLY MULTLAY COMPRS LWR LT LEG Modifier: Quantity: 1 Physician Procedures CPT4 Meza Description: [id number] - WC PHYS LEVEL 3 - EST PT ICD-10 Diagnosis Description L97.228 Non-pressure chronic ulcer of left calf with other specified seve I89.0 Lymphedema, not elsewhere classified L97.211 Non-pressure chronic ulcer of right  calf limited to breakdown of I70.212 Atherosclerosis of native arteries of extremities with intermitte Modifier: rity skin nt claudication, l Quantity: 1 eft leg Electronic Signature(s) Signed: 09/13/2018 4:30:12 PM  By: Linton Ham MD Entered By: Linton Ham on 09/13/2018 16:29:09

## 2018-09-14 NOTE — Progress Notes (Signed)
LASHON, HILLIER ([id number]) Visit Report for 09/13/2018 Arrival Information Details Patient Name: Steven Meza, Steven Meza Date of Service: 09/13/2018 3:45 PM Medical Record Number: [id number] Patient Account Number: [id number] Date of Birth/Sex: 12-16-1930 (85 y.o. M) Treating RN: Harold Barban Primary Care Lavone Weisel: Fulton Reek Other Clinician: Referring Kattaleya Alia: Fulton Reek Treating Wen Merced/Extender: Tito Dine in Treatment: 10 Visit Information History Since Last Visit Added or deleted any medications: No Patient Arrived: Cane Any new allergies or adverse reactions: No Arrival Time: 15:36 Had a fall or experienced change in No Accompanied By: self activities of daily living that may affect Transfer Assistance: None risk of falls: Patient Identification Verified: Yes Signs or symptoms of abuse/neglect since last visito No Secondary Verification Process Completed: Yes Hospitalized since last visit: No Has Dressing in Place as Prescribed: Yes Has Compression in Place as Prescribed: Yes Pain Present Now: No Electronic Signature(s) Signed: 09/13/2018 4:23:05 PM By: Harold Barban Entered By: Harold Barban on 09/13/2018 15:37:12 Steven Meza ([id number]) -------------------------------------------------------------------------------- Compression Therapy Details Patient Name: Steven Meza Date of Service: 09/13/2018 3:45 PM Medical Record Number: [id number] Patient Account Number: [id number] Date of Birth/Sex: 06/03/1930 (85 y.o. M) Treating RN: Cornell Barman Primary Care Lateef Juncaj: Fulton Reek Other Clinician: Referring Zaccheus Edmister: Fulton Reek Treating Vishwa Dais/Extender: Tito Dine in Treatment: 17 Compression Therapy Performed for Wound Assessment: Wound #1 Left,Lateral Lower Leg Performed By: Clinician Cornell Barman, RN Compression Type: Three Layer Pre Treatment ABI: 0.6 Post Procedure Diagnosis Same as Pre-procedure Electronic  Signature(s) Signed: 09/14/2018 10:03:11 AM By: Gretta Cool, BSN, RN, CWS, Kim RN, BSN Entered By: Gretta Cool, BSN, RN, CWS, Kim on 09/13/2018 16:06:17 Steven Meza ([id number]) -------------------------------------------------------------------------------- Encounter Discharge Information Details Patient Name: Steven Meza Date of Service: 09/13/2018 3:45 PM Medical Record Number: [id number] Patient Account Number: [id number] Date of Birth/Sex: 1930/07/04 (85 y.o. M) Treating RN: Cornell Barman Primary Care Sissy Goetzke: Fulton Reek Other Clinician: Referring Shamila Lerch: Fulton Reek Treating Dayshaun Whobrey/Extender: Tito Dine in Treatment: 17 Encounter Discharge Information Items Discharge Condition: Stable Ambulatory Status: Cane Discharge Destination: Home Transportation: Private Auto Accompanied By: self Schedule Follow-up Appointment: Yes Clinical Summary of Care: Electronic Signature(s) Signed: 09/14/2018 10:03:11 AM By: Gretta Cool, BSN, RN, CWS, Kim RN, BSN Entered By: Gretta Cool, BSN, RN, CWS, Kim on 09/13/2018 16:16:55 Steven Meza ([id number]) -------------------------------------------------------------------------------- Lower Extremity Assessment Details Patient Name: Steven Meza Date of Service: 09/13/2018 3:45 PM Medical Record Number: [id number] Patient Account Number: [id number] Date of Birth/Sex: [birth year] (85 y.o. M) Treating RN: Harold Barban Primary Care Jennfer Gassen: Fulton Reek Other Clinician: Referring Desirai Traxler: Fulton Reek Treating Shonn Farruggia/Extender: Tito Dine in Treatment: 17 Edema Assessment Assessed: [Left: No] Patrice Paradise: No] [Left: Edema] [Right: :] Calf Left: Right: Point of Measurement: 32 cm From Medial Instep 43 cm 41.5 cm Ankle Left: Right: Point of Measurement: 13 cm From Medial Instep 28 cm 27.8 cm Vascular Assessment Pulses: Dorsalis Pedis Palpable: [Left:Yes] [Right:Yes] Posterior Tibial Palpable: [Left:Yes]  [Right:Yes] Electronic Signature(s) Signed: 09/13/2018 4:23:05 PM By: Harold Barban Entered By: Harold Barban on 09/13/2018 15:46:25 Steven Meza ([id number]) -------------------------------------------------------------------------------- Multi Wound Chart Details Patient Name: Steven Meza Date of Service: 09/13/2018 3:45 PM Medical Record Number: [id number] Patient Account Number: [id number] Date of Birth/Sex: 1930/11/15 (85 y.o. M) Treating RN: Cornell Barman Primary Care Daelin Haste: Fulton Reek Other Clinician: Referring Sury Wentworth: Fulton Reek Treating Trysten Bernard/Extender: Tito Dine in Treatment: 17 Vital Signs Height(in): 71 Pulse(bpm): 89 Weight(lbs): 252 Blood Pressure(mmHg): 159/71 Body Mass Index(BMI): 35 Temperature(F): 98.2 Respiratory Rate 18 (  breaths/min): Photos: [N/A:N/A] Wound Location: Left Lower Leg - Lateral Right Lower Leg - Anterior N/A Wounding Event: Gradually Appeared Gradually Appeared N/A Primary Etiology: Lymphedema Lymphedema N/A Comorbid History: Lymphedema, Coronary Artery Lymphedema, Coronary Artery N/A Disease, Hypertension Disease, Hypertension Date Acquired: 11/14/2017 08/21/2018 N/A Weeks of Treatment: 17 3 N/A Wound Status: Open Open N/A Clustered Wound: Yes No N/A Measurements L x W x D 0.9x0.5x0.2 0.3x0.2x0.1 N/A (cm) Area (cm) : 0.353 0.047 N/A Volume (cm) : 0.071 0.005 N/A % Reduction in Area: 88.80% 96.20% N/A % Reduction in Volume: 77.40% 95.90% N/A Classification: Full Thickness Without Full Thickness Without N/A Exposed Support Structures Exposed Support Structures Exudate Amount: Medium Small N/A Exudate Type: Serous Serous N/A Exudate Color: amber amber N/A Wound Margin: Flat and Intact Flat and Intact N/A Granulation Amount: Large (67-100%) Large (67-100%) N/A Granulation Quality: Pink Pink N/A Necrotic Amount: Small (1-33%) None Present (0%) N/A Exposed Structures: Fat Layer (Subcutaneous Fat  Layer (Subcutaneous N/A Tissue) Exposed: Yes Tissue) Exposed: Yes Fascia: No Tendon: No Muscle: No Steven Meza, Steven Meza ([id number]) Joint: No Bone: No Epithelialization: None Small (1-33%) N/A Periwound Skin Texture: Excoriation: No Excoriation: No N/A Induration: No Induration: No Callus: No Callus: No Crepitus: No Crepitus: No Rash: No Rash: No Scarring: No Scarring: No Periwound Skin Moisture: Dry/Scaly: Yes Maceration: No N/A Maceration: No Dry/Scaly: No Periwound Skin Color: Hemosiderin Staining: Yes Hemosiderin Staining: Yes N/A Atrophie Blanche: No Atrophie Blanche: No Cyanosis: No Cyanosis: No Ecchymosis: No Ecchymosis: No Erythema: No Erythema: No Mottled: No Mottled: No Pallor: No Pallor: No Rubor: No Rubor: No Temperature: No Abnormality No Abnormality N/A Tenderness on Palpation: No No N/A Procedures Performed: Compression Therapy N/A N/A Treatment Notes Wound #1 (Left, Lateral Lower Leg) Notes H Blue, ABD, 3 layer left, Kerlix and Coban right. Wound #2 (Right, Anterior Lower Leg) Notes H Blue, ABD, 3 layer left, Kerlix and Coban right. Electronic Signature(s) Signed: 09/13/2018 4:30:12 PM By: Linton Ham MD Entered By: Linton Ham on 09/13/2018 16:17:26 Steven Meza ([id number]) -------------------------------------------------------------------------------- Multi-Disciplinary Care Plan Details Patient Name: Steven Meza Date of Service: 09/13/2018 3:45 PM Medical Record Number: [id number] Patient Account Number: [id number] Date of Birth/Sex: 09-12-1930 (85 y.o. M) Treating RN: Cornell Barman Primary Care Samuella Rasool: Fulton Reek Other Clinician: Referring Iyana Topor: Fulton Reek Treating Mahlet Jergens/Extender: Tito Dine in Treatment: 72 Active Inactive Orientation to the Wound Care Program Nursing Diagnoses: Knowledge deficit related to the wound healing center program Goals: Patient/caregiver will verbalize  understanding of the [place] Date Initiated: 05/17/2018 Target Resolution Date: 06/17/2018 Goal Status: Active Interventions: Provide education on orientation to the wound center Notes: Venous Leg Ulcer Nursing Diagnoses: Potential for venous Insuffiency (use before diagnosis confirmed) Goals: Patient/caregiver will verbalize understanding of disease process and disease management Date Initiated: 05/17/2018 Target Resolution Date: 06/17/2018 Goal Status: Active Interventions: Assess peripheral edema status every visit. Treatment Activities: Non-invasive vascular studies : 05/17/2018 Notes: Wound/Skin Impairment Nursing Diagnoses: Impaired tissue integrity Goals: Patient/caregiver will verbalize understanding of skin care regimen Date Initiated: 05/17/2018 Target Resolution Date: 05/17/2018 Goal Status: Active Steven Meza, Steven Meza ([id number]) Ulcer/skin breakdown will heal within 14 weeks Date Initiated: 05/17/2018 Target Resolution Date: 08/16/2018 Goal Status: Active Interventions: Assess ulceration(s) every visit Treatment Activities: Referred to DME Ludella Pranger for dressing supplies : 05/17/2018 Topical wound management initiated : 05/17/2018 Notes: Electronic Signature(s) Signed: 09/14/2018 10:03:11 AM By: Gretta Cool, BSN, RN, CWS, Kim RN, BSN Entered By: Gretta Cool, BSN, RN, CWS, Kim on 09/13/2018 16:03:27 Steven Meza ([id number]) --------------------------------------------------------------------------------  Pain Assessment Details Patient Name: Steven Meza, Steven Meza Date of Service: 09/13/2018 3:45 PM Medical Record Number: [id number] Patient Account Number: [id number] Date of Birth/Sex: 1930/05/12 (85 y.o. M) Treating RN: Harold Barban Primary Care Ziva Nunziata: Fulton Reek Other Clinician: Referring Corean Yoshimura: Fulton Reek Treating Tija Biss/Extender: Tito Dine in Treatment: 17 Active Problems Location of Pain Severity and Description of  Pain Patient Has Paino No Site Locations Pain Management and Medication Current Pain Management: Electronic Signature(s) Signed: 09/13/2018 4:23:05 PM By: Harold Barban Entered By: Harold Barban on 09/13/2018 15:37:25 Steven Meza ([id number]) -------------------------------------------------------------------------------- Patient/Caregiver Education Details Patient Name: Steven Meza Date of Service: 09/13/2018 3:45 PM Medical Record Number: [id number] Patient Account Number: [id number] Date of Birth/Gender: 1930/06/18 (85 y.o. M) Treating RN: Cornell Barman Primary Care Physician: Fulton Reek Other Clinician: Referring Physician: Fulton Reek Treating Physician/Extender: Tito Dine in Treatment: 89 Education Assessment Education Provided To: Patient Education Topics Provided Wound/Skin Impairment: Handouts: Caring for Your Ulcer Methods: Demonstration, Explain/Verbal Responses: State content correctly Electronic Signature(s) Signed: 09/14/2018 10:03:11 AM By: Gretta Cool, BSN, RN, CWS, Kim RN, BSN Entered By: Gretta Cool, BSN, RN, CWS, Kim on 09/13/2018 16:15:38 Steven Meza ([id number]) -------------------------------------------------------------------------------- Wound Assessment Details Patient Name: Steven Meza Date of Service: 09/13/2018 3:45 PM Medical Record Number: [id number] Patient Account Number: [id number] Date of Birth/Sex: January 02, 1931 (85 y.o. M) Treating RN: Harold Barban Primary Care Dalma Panchal: Fulton Reek Other Clinician: Referring Karesa Maultsby: Fulton Reek Treating Jeoffrey Eleazer/Extender: Tito Dine in Treatment: 17 Wound Status Wound Number: 1 Primary Lymphedema Etiology: Wound Location: Left Lower Leg - Lateral Wound Status: Open Wounding Event: Gradually Appeared Comorbid Lymphedema, Coronary Artery Disease, Date Acquired: 11/14/2017 History: Hypertension Weeks Of Treatment: 17 Clustered Wound:  Yes Photos Wound Measurements Length: (cm) 0.9 Width: (cm) 0.5 Depth: (cm) 0.2 Area: (cm) 0.353 Volume: (cm) 0.071 % Reduction in Area: 88.8% % Reduction in Volume: 77.4% Epithelialization: None Tunneling: No Undermining: No Wound Description Full Thickness Without Exposed Support Foul O Classification: Structures Slough Wound Margin: Flat and Intact Exudate Medium Amount: Exudate Type: Serous Exudate Color: amber dor After Cleansing: No /Fibrino Yes Wound Bed Granulation Amount: Large (67-100%) Exposed Structure Granulation Quality: Pink Fat Layer (Subcutaneous Tissue) Exposed: Yes Necrotic Amount: Small (1-33%) Necrotic Quality: Adherent Slough Periwound Skin Texture Texture Color No Abnormalities Noted: No No Abnormalities Noted: No Steven Meza, Steven Meza ([id number]) Callus: No Atrophie Blanche: No Crepitus: No Cyanosis: No Excoriation: No Ecchymosis: No Induration: No Erythema: No Rash: No Hemosiderin Staining: Yes Scarring: No Mottled: No Pallor: No Moisture Rubor: No No Abnormalities Noted: No Dry / Scaly: Yes Temperature / Pain Maceration: No Temperature: No Abnormality Treatment Notes Wound #1 (Left, Lateral Lower Leg) Notes H Blue, ABD, 3 layer left, Kerlix and Coban right. Electronic Signature(s) Signed: 09/13/2018 4:23:05 PM By: Harold Barban Entered By: Harold Barban on 09/13/2018 15:54:06 Steven Meza ([id number]) -------------------------------------------------------------------------------- Wound Assessment Details Patient Name: Steven Meza Date of Service: 09/13/2018 3:45 PM Medical Record Number: [id number] Patient Account Number: [id number] Date of Birth/Sex: 11/17/1930 (85 y.o. M) Treating RN: Harold Barban Primary Care Melinda Gwinner: Fulton Reek Other Clinician: Referring Shamarra Warda: Fulton Reek Treating Eudell Mcphee/Extender: Tito Dine in Treatment: 17 Wound Status Wound Number: 2 Primary  Lymphedema Etiology: Wound Location: Right Lower Leg - Anterior Wound Status: Open Wounding Event: Gradually Appeared Comorbid Lymphedema, Coronary Artery Disease, Date Acquired: 08/21/2018 History: Hypertension Weeks Of Treatment: 3 Clustered Wound: No Photos Wound Measurements Length: (cm) 0.3 Width: (cm) 0.2 Depth: (cm) 0.1 Area: (cm) 0.047 Volume: (  cm) 0.005 % Reduction in Area: 96.2% % Reduction in Volume: 95.9% Epithelialization: Small (1-33%) Tunneling: No Undermining: No Wound Description Full Thickness Without Exposed Support Classification: Structures Wound Margin: Flat and Intact Exudate Small Amount: Exudate Type: Serous Exudate Color: amber Foul Odor After Cleansing: No Slough/Fibrino Yes Wound Bed Granulation Amount: Large (67-100%) Exposed Structure Granulation Quality: Pink Fascia Exposed: No Necrotic Amount: None Present (0%) Fat Layer (Subcutaneous Tissue) Exposed: Yes Tendon Exposed: No Muscle Exposed: No Joint Exposed: No Bone Exposed: No Periwound Skin Texture Steven Meza, Steven Meza ([id number]) Texture Color No Abnormalities Noted: No No Abnormalities Noted: No Callus: No Atrophie Blanche: No Crepitus: No Cyanosis: No Excoriation: No Ecchymosis: No Induration: No Erythema: No Rash: No Hemosiderin Staining: Yes Scarring: No Mottled: No Pallor: No Moisture Rubor: No No Abnormalities Noted: No Dry / Scaly: No Temperature / Pain Maceration: No Temperature: No Abnormality Treatment Notes Wound #2 (Right, Anterior Lower Leg) Notes H Blue, ABD, 3 layer left, Kerlix and Coban right. Electronic Signature(s) Signed: 09/13/2018 4:23:05 PM By: Harold Barban Entered By: Harold Barban on 09/13/2018 15:54:41 Steven Meza ([id number]) -------------------------------------------------------------------------------- [place] Details Patient Name: Steven Meza Date of Service: 09/13/2018 3:45 PM Medical Record Number:  [id number] Patient Account Number: [id number] Date of Birth/Sex: 09/20/1930 (85 y.o. M) Treating RN: Harold Barban Primary Care Peyson Postema: Fulton Reek Other Clinician: Referring Jalin Alicea: Fulton Reek Treating Dhruv Christina/Extender: Tito Dine in Treatment: 17 Vital Signs Time Taken: 15:35 Temperature (F): 98.2 Height (in): 71 Pulse (bpm): 89 Weight (lbs): 252 Respiratory Rate (breaths/min): 18 Body Mass Index (BMI): 35.1 Blood Pressure (mmHg): 159/71 Reference Range: 80 - 120 mg / dl Electronic Signature(s) Signed: 09/13/2018 4:23:05 PM By: Harold Barban Entered By: Harold Barban on 09/13/2018 15:39:48

## 2018-09-20 ENCOUNTER — Other Ambulatory Visit: Payer: Self-pay

## 2018-09-20 ENCOUNTER — Encounter: Payer: Medicare Other | Admitting: Internal Medicine

## 2018-09-20 DIAGNOSIS — I70212 Atherosclerosis of native arteries of extremities with intermittent claudication, left leg: Secondary | ICD-10-CM | POA: Diagnosis not present

## 2018-09-21 NOTE — Progress Notes (Signed)
Steven Meza ([id number]) Visit Report for 09/20/2018 Arrival Information Details Patient Name: Steven Meza, Steven Meza Date of Service: 09/20/2018 1:00 PM Medical Record Number: [id number] Patient Account Number: [id number] Date of Birth/Sex: Aug 29, 1930 (85 y.o. M) Treating RN: Harold Barban Primary Care Hamda Klutts: Fulton Reek Other Clinician: Referring Rayonna Heldman: Fulton Reek Treating Karie Skowron/Extender: Beverly Gust in Treatment: 18 Visit Information History Since Last Visit Added or deleted any medications: No Patient Arrived: Cane Any new allergies or adverse reactions: No Arrival Time: 13:02 Had a fall or experienced change in No Accompanied By: self activities of daily living that may affect Transfer Assistance: None risk of falls: Patient Identification Verified: Yes Signs or symptoms of abuse/neglect since last visito No Secondary Verification Process Completed: Yes Hospitalized since last visit: No Has Compression in Place as Prescribed: Yes Pain Present Now: No Electronic Signature(s) Signed: 09/20/2018 4:44:13 PM By: Harold Barban Entered By: Harold Barban on 09/20/2018 13:03:42 Steven Meza ([id number]) -------------------------------------------------------------------------------- Lower Extremity Assessment Details Patient Name: Steven Meza Date of Service: 09/20/2018 1:00 PM Medical Record Number: [id number] Patient Account Number: [id number] Date of Birth/Sex: [birth year] (85 y.o. M) Treating RN: Harold Barban Primary Care Elsie Baynes: Fulton Reek Other Clinician: Referring Taylon Coole: Fulton Reek Treating Kazuko Clemence/Extender: Beverly Gust in Treatment: 18 Edema Assessment Assessed: [Left: No] Patrice Paradise: No] [Left: Edema] [Right: :] Calf Left: Right: Point of Measurement: 32 cm From Medial Instep 43 cm 41.5 cm Ankle Left: Right: Point of Measurement: 13 cm From Medial Instep 28 cm 28 cm Vascular  Assessment Pulses: Dorsalis Pedis Palpable: [Left:Yes] [Right:Yes] Posterior Tibial Palpable: [Left:Yes] [Right:Yes] Electronic Signature(s) Signed: 09/20/2018 4:44:13 PM By: Harold Barban Entered By: Harold Barban on 09/20/2018 13:22:54 Steven Meza ([id number]) -------------------------------------------------------------------------------- Multi Wound Chart Details Patient Name: Steven Meza Date of Service: 09/20/2018 1:00 PM Medical Record Number: [id number] Patient Account Number: [id number] Date of Birth/Sex: Nov 17, 1930 (85 y.o. M) Treating RN: Cornell Barman Primary Care Blessing Zaucha: Fulton Reek Other Clinician: Referring Raynelle Fujikawa: Fulton Reek Treating Naija Troost/Extender: Beverly Gust in Treatment: 18 Vital Signs Height(in): 71 Pulse(bpm): 53 Weight(lbs): 252 Blood Pressure(mmHg): 160/62 Body Mass Index(BMI): 35 Temperature(F): 97.8 Respiratory Rate 20 (breaths/min): Photos: [N/A:N/A] Wound Location: Left Lower Leg - Lateral Right Lower Leg - Anterior N/A Wounding Event: Gradually Appeared Gradually Appeared N/A Primary Etiology: Lymphedema Lymphedema N/A Comorbid History: Lymphedema, Coronary Artery Lymphedema, Coronary Artery N/A Disease, Hypertension Disease, Hypertension Date Acquired: 11/14/2017 08/21/2018 N/A Weeks of Treatment: 18 4 N/A Wound Status: Open Open N/A Clustered Wound: Yes No N/A Measurements L x W x D 0.8x0.5x0.3 0.1x0.1x0.1 N/A (cm) Area (cm) : 0.314 0.008 N/A Volume (cm) : 0.094 0.001 N/A % Reduction in Area: 90.00% 99.30% N/A % Reduction in Volume: 70.10% 99.20% N/A Classification: Full Thickness Without Full Thickness Without N/A Exposed Support Structures Exposed Support Structures Exudate Amount: Medium None Present N/A Exudate Type: Serous N/A N/A Exudate Color: amber N/A N/A Wound Margin: Flat and Intact Flat and Intact N/A Granulation Amount: Large (67-100%) Large (67-100%) N/A Granulation Quality: Pink  Pink N/A Necrotic Amount: Small (1-33%) None Present (0%) N/A Exposed Structures: Fat Layer (Subcutaneous Fascia: No N/A Tissue) Exposed: Yes Fat Layer (Subcutaneous Tissue) Exposed: No Tendon: No Muscle: No Joint: No Bone: No Epithelialization: None None N/A Treatment Notes Electronic Signature(s) TIMBER, LUCARELLI ([id number]) Signed: 09/20/2018 5:05:53 PM By: Gretta Cool, BSN, RN, CWS, Kim RN, BSN Entered By: Gretta Cool, BSN, RN, CWS, Kim on 09/20/2018 13:28:49 Steven Meza ([id number]) -------------------------------------------------------------------------------- Multi-Disciplinary Care Plan Details Patient Name: Steven Meza Date of Service:  09/20/2018 1:00 PM Medical Record Number: [id number] Patient Account Number: [id number] Date of Birth/Sex: 08-30-1930 (85 y.o. M) Treating RN: Cornell Barman Primary Care Jefferson Fullam: Fulton Reek Other Clinician: Referring Torryn Fiske: Fulton Reek Treating Anquan Azzarello/Extender: Beverly Gust in Treatment: 30 Active Inactive Orientation to the Wound Care Program Nursing Diagnoses: Knowledge deficit related to the wound healing center program Goals: Patient/caregiver will verbalize understanding of the [facility] Program Date Initiated: 05/17/2018 Target Resolution Date: 06/17/2018 Goal Status: Active Interventions: Provide education on orientation to the wound center Notes: Venous Leg Ulcer Nursing Diagnoses: Potential for venous Insuffiency (use before diagnosis confirmed) Goals: Patient/caregiver will verbalize understanding of disease process and disease management Date Initiated: 05/17/2018 Target Resolution Date: 06/17/2018 Goal Status: Active Interventions: Assess peripheral edema status every visit. Treatment Activities: Non-invasive vascular studies : 05/17/2018 Notes: Wound/Skin Impairment Nursing Diagnoses: Impaired tissue integrity Goals: Patient/caregiver will verbalize understanding of skin care  regimen Date Initiated: 05/17/2018 Target Resolution Date: 05/17/2018 Goal Status: Active Ulcer/skin breakdown will heal within 14 weeks Date Initiated: 05/17/2018 Target Resolution Date: 08/16/2018 Goal Status: Active Interventions: Assess ulceration(s) every visit Treatment Activities: DEYLAN, [place] ([id number]) Referred to DME Amrit Cress for dressing supplies : 05/17/2018 Topical wound management initiated : 05/17/2018 Notes: Electronic Signature(s) Signed: 09/20/2018 5:05:53 PM By: Gretta Cool, BSN, RN, CWS, Kim RN, BSN Entered By: Gretta Cool, BSN, RN, CWS, Kim on 09/20/2018 13:28:38 Steven Meza ([id number]) -------------------------------------------------------------------------------- Pain Assessment Details Patient Name: Steven Meza Date of Service: 09/20/2018 1:00 PM Medical Record Number: [id number] Patient Account Number: [id number] Date of Birth/Sex: 03-Oct-1930 (85 y.o. M) Treating RN: Harold Barban Primary Care Anaiya Wisinski: Fulton Reek Other Clinician: Referring Advika Mclelland: Fulton Reek Treating Camiah Humm/Extender: Beverly Gust in Treatment: 34 Active Problems Location of Pain Severity and Description of Pain Patient Has Paino No Site Locations Pain Management and Medication Current Pain Management: Electronic Signature(s) Signed: 09/20/2018 4:44:13 PM By: Harold Barban Entered By: Harold Barban on 09/20/2018 13:03:50 Steven Meza ([id number]) -------------------------------------------------------------------------------- Patient/Caregiver Education Details Patient Name: Steven Meza Date of Service: 09/20/2018 1:00 PM Medical Record Number: [id number] Patient Account Number: [id number] Date of Birth/Gender: 09/04/1930 (85 y.o. M) Treating RN: Cornell Barman Primary Care Physician: Fulton Reek Other Clinician: Referring Physician: Fulton Reek Treating Physician/Extender: Beverly Gust in Treatment: 22 Education  Assessment Education Provided To: Patient Education Topics Provided Wound/Skin Impairment: Handouts: Caring for Your Ulcer, Other: Continue wound care as prescribed Methods: Demonstration, Explain/Verbal Responses: State content correctly Electronic Signature(s) Signed: 09/20/2018 5:05:53 PM By: Gretta Cool, BSN, RN, CWS, Kim RN, BSN Entered By: Gretta Cool, BSN, RN, CWS, Kim on 09/20/2018 13:32:47 Steven Meza ([id number]) -------------------------------------------------------------------------------- Wound Assessment Details Patient Name: Steven Meza Date of Service: 09/20/2018 1:00 PM Medical Record Number: [id number] Patient Account Number: [id number] Date of Birth/Sex: June 08, 1930 (85 y.o. M) Treating RN: Harold Barban Primary Care Jeanmarc Viernes: Fulton Reek Other Clinician: Referring Jiana Lemaire: Fulton Reek Treating Garrett Mitchum/Extender: Beverly Gust in Treatment: 18 Wound Status Wound Number: 1 Primary Lymphedema Etiology: Wound Location: Left Lower Leg - Lateral Wound Status: Open Wounding Event: Gradually Appeared Comorbid Lymphedema, Coronary Artery Disease, Date Acquired: 11/14/2017 History: Hypertension Weeks Of Treatment: 18 Clustered Wound: Yes Photos Wound Measurements Length: (cm) 0.8 Width: (cm) 0.5 Depth: (cm) 0.3 Area: (cm) 0.314 Volume: (cm) 0.094 % Reduction in Area: 90% % Reduction in Volume: 70.1% Epithelialization: None Tunneling: No Undermining: No Wound Description Full Thickness Without Exposed Support Classification: Structures Wound Margin: Flat and Intact Exudate Medium Amount: Exudate Type: Serous Exudate Color: amber Foul Odor After Cleansing: No  Slough/Fibrino Yes Wound Bed Granulation Amount: Large (67-100%) Exposed Structure Granulation Quality: Pink Fat Layer (Subcutaneous Tissue) Exposed: Yes Necrotic Amount: Small (1-33%) Necrotic Quality: Adherent Therapist, music) Signed: 09/20/2018 4:44:13 PM  By: Harold Barban Entered By: Harold Barban on 09/20/2018 13:19:54 Steven Meza ([id number]) -------------------------------------------------------------------------------- Wound Assessment Details Patient Name: Steven Meza Date of Service: 09/20/2018 1:00 PM Medical Record Number: [id number] Patient Account Number: [id number] Date of Birth/Sex: 07-15-1930 (85 y.o. M) Treating RN: Cornell Barman Primary Care Alysse Rathe: Fulton Reek Other Clinician: Referring Alisson Rozell: Fulton Reek Treating Ebba Goll/Extender: Beverly Gust in Treatment: 18 Wound Status Wound Number: 2 Primary Lymphedema Etiology: Wound Location: Right, Anterior Lower Leg Wound Status: Healed - Epithelialized Wounding Event: Gradually Appeared Comorbid Lymphedema, Coronary Artery Disease, Date Acquired: 08/21/2018 History: Hypertension Weeks Of Treatment: 4 Clustered Wound: No Photos Wound Measurements Length: (cm) 0 Width: (cm) 0 Depth: (cm) 0 Area: (cm) 0 Volume: (cm) 0 % Reduction in Area: 100% % Reduction in Volume: 100% Epithelialization: None Tunneling: No Undermining: No Wound Description Full Thickness Without Exposed Support Classification: Structures Wound Margin: Flat and Intact Exudate None Present Amount: Foul Odor After Cleansing: No Slough/Fibrino No Wound Bed Granulation Amount: Large (67-100%) Exposed Structure Granulation Quality: Pink Fascia Exposed: No Necrotic Amount: None Present (0%) Fat Layer (Subcutaneous Tissue) Exposed: No Tendon Exposed: No Muscle Exposed: No Joint Exposed: No Bone Exposed: No Electronic Signature(s) Signed: 09/20/2018 5:05:53 PM By: Gretta Cool, BSN, RN, CWS, Kim RN, BSN Entered By: Gretta Cool, BSN, RN, CWS, Kim on 09/20/2018 13:29:04 Steven Meza ([id number]) -------------------------------------------------------------------------------- Vitals Details Patient Name: Steven Meza Date of Service: 09/20/2018 1:00 PM Medical  Record Number: [id number] Patient Account Number: [id number] Date of Birth/Sex: [birth year] (85 y.o. M) Treating RN: Harold Barban Primary Care Earlyn Sylvan: Fulton Reek Other Clinician: Referring Diondre Pulis: Fulton Reek Treating Havish Petties/Extender: Beverly Gust in Treatment: 18 Vital Signs Time Taken: 13:00 Temperature (F): 97.8 Height (in): 71 Pulse (bpm): 67 Weight (lbs): 252 Respiratory Rate (breaths/min): 20 Body Mass Index (BMI): 35.1 Blood Pressure (mmHg): 160/62 Reference Range: 80 - 120 mg / dl Electronic Signature(s) Signed: 09/20/2018 4:44:13 PM By: Harold Barban Entered By: Harold Barban on 09/20/2018 13:06:05

## 2018-09-21 NOTE — Progress Notes (Signed)
Steven Meza, Steven Meza ([id number]) Visit Report for 09/20/2018 Debridement Details Patient Name: Steven Meza, Steven Meza Date of Service: 09/20/2018 1:00 PM Medical Record Number: [id number] Patient Account Number: [id number] Date of Birth/Sex: [birth year] (85 y.o. M) Treating RN: Cornell Barman Primary Care Provider: Fulton Reek Other Clinician: Referring Provider: Fulton Reek Treating Provider/Extender: Beverly Gust in Treatment: 18 Debridement Performed for Wound #1 Left,Lateral Lower Leg Assessment: Performed By: Physician Tobi Bastos, MD Debridement Type: Debridement Level of Consciousness (Pre- Awake and Alert procedure): Pre-procedure Verification/Time Yes - 13:29 Out Taken: Start Time: 13:29 Pain Control: Lidocaine Total Area Debrided (L x W): 0.8 (cm) x 0.5 (cm) = 0.4 (cm) Tissue and other material Non-Viable, [place] debrided: Level: Non-Viable Tissue Debridement Description: Selective/Open Wound Instrument: Curette Bleeding: None End Time: 13:31 Response to Treatment: Procedure was tolerated well Level of Consciousness Awake and Alert (Post-procedure): Post Debridement Measurements of Total Wound Length: (cm) 0.8 Width: (cm) 0.5 Depth: (cm) 0.3 Volume: (cm) 0.094 Character of Wound/Ulcer Post Debridement: Requires Further Debridement Post Procedure Diagnosis Same as Pre-procedure Electronic Signature(s) Signed: 09/20/2018 4:16:13 PM By: Tobi Bastos Signed: 09/20/2018 5:05:53 PM By: Gretta Cool, BSN, RN, CWS, Kim RN, BSN Entered By: Gretta Cool, BSN, RN, CWS, Kim on 09/20/2018 13:30:26 Steven Meza ([id number]) -------------------------------------------------------------------------------- HPI Details Patient Name: Steven Meza Date of Service: 09/20/2018 1:00 PM Medical Record Number: [id number] Patient Account Number: [id number] Date of Birth/Sex: 01-Apr-1931 (85 y.o. M) Treating RN: Cornell Barman Primary Care Provider: Fulton Reek Other  Clinician: Referring Provider: Fulton Reek Treating Provider/Extender: Beverly Gust in Treatment: 18 History of Present Illness HPI Description: ADMISSION 05/17/2018 [name] is an 85 year old man who is been treated at the lymphedema clinic for a long period with lymphedema wraps for bilateral lower extremity lymphedema. About 6 months ago they noted small open areas on his left lateral calf just above the ankle. These were open. They are apparently putting some form of ointment on this. They have been referred here for our review of this. The patient has a long history of lymphedema and venous stasis. It says in his records that he has recurrent cellulitis although his wife denies this but she states that the lymphedema may have started with cellulitis several years ago. Also in his record there is a history of bladder cancer which they seem to know very little about however he did not have any radiation to the pelvis or anything that could have contributed to lymphedema that they are aware of. There is no prior wound history. The patient has 2 small punched out areas on the left lateral calf that have adherent debris on the surface. Past medical history; lymphedema, hypertension, cellulitis, hearing loss, morbid obesity, osteoarthritis, venous stasis, bladder CA, diverticulosis. ABIs in our clinic were 0.36 on the right and 0.57 on the left 05/24/2018; corrected age on this patient is 85 versus what I said last week. He has been for his arterial studies which predictably are not very good. On the right his ABI is 0.65. Monophasic waveforms noted at the right ankle including the posterior tibial and dorsalis pedis. He has triphasic waveforms of the common femoral and profunda femoris triphasic proximal SFA with monophasic distal FSA and popliteal artery. Monophasic tibial waveforms. On the left his ABI is 0.58 monophasic waveforms at the left ankle. Duplex of the left lower  extremity demonstrated atherosclerotic change with monophasic waveforms throughout the left lower extremity. There was some concern about left iliac disease and potentially femoral-popliteal and tibial disease. The patient  does not describe claudication although according to his wife he over emphasizes his activity. Patient states he is limited by pain in both knees His wounds are on the left lateral calf. 2 small areas with necrotic debris. We have been using silver collagen and light Ace wraps 05/31/2018; wounds on the left lateral calf in the setting of very significant lymphedema and probably PAD. We have been using silver collagen. The base of the small wound looks reasonably improved. I sent him to see Dr. Fletcher Anon however I see now he has an appointment with Dr. Alvester Chou on February 12 2/5; left lateral calf wound looks the same. His wife is doing a good job with her lymphedema wraps and maintaining that the edema. He most likely has PAD and is due to see Dr. Gwenlyn Found of infectious disease on February 12 2/19; left lateral calf wounds look about the same. This looks like wound secondary to chronic venous insufficiency with lymphedema however the patient has very poor arterial studies. We referred him to Dr. Gwenlyn Found. Dr. Gwenlyn Found did not feel he needed to do anything from an arterial point of view. He did not address the question about the aggressiveness of compression. After some thoughts about this I elected to go ahead and put him in 3 layer compression which after all would be less compression then the lymphedema clinic was putting on this. I am hopeful that this should be enough to get some closure of the small wounds 2/26; left lateral calf wound letter this week. He tolerated the 3 layer compression well furthermore he is sleeping in a hospital bed which helps keep his legs up at night. The wound looks better measuring smaller especially in width 3/3; left lateral calf wound about the same size.  The 3 layer compression has really helped get the edema down in his leg however. Using silver collagen 3/11; left lateral calf wound better looking wound surface but about the same size. We have been using 3 layer compression which is really helped get the edema down in the left leg. Even after Dr. Kennon Holter assessment I am reluctant to go to 4 layer compression. He is tolerating 3 layer compression well. We have been using silver collagen 3/18-Patient returns for his left lateral calf wound which overall is looking better, slightly increased in size and dimensions and area, he is tolerating the 3 layer compression, has been dressed with silver collagen which we will continue 3/25oleft lateral calf wound much better looking. Size is smaller. He has been using silver collagen 4/1; gradually getting smaller in size. Using silver collagen with 3 layer compression. I think the patient has some degree of PAD. He saw Dr. Gwenlyn Found in consultation, he did not specifically comment on whether he could tolerate 4 layer compression 4/8; the wound is slightly smaller in depth. We are using 3 layer compression with silver collagen. I had him seen for arterial insufficiency however his ABI was 0.57 in the left leg. Dr. Gwenlyn Found really did not seem to middle on the degree of compression he could tolerate Steven Meza, Steven A. ([id number]) 4/15; the wound is slightly smaller and slightly less deep. We have been using 3 layer compression with silver collagen. There is not an option for 4-layer compression here. 4/22; the patient is making decent progress on his wound on the left lateral calf however he arrives in with a superficial wound on the right anterior tibial area. Were not really sure how this happened. He had been using a stocking on  the right leg. He has been using silver collagen on the wounds Amazing to me he is he appears to have compression pumps at home. I am not really sure I knew this before. In any case  he has not been using them 4/29; left lateral calf wound continues to get gradually smaller. This looks like it is on its way to closure. oThe area on the right anterior tibial area is about the same in terms of size superficial without any depth. This was probably wrap injury oHe tells me that he has been using his compression pumps once a day for 1 hour without any pain 5/6; not as good today. Left lateral calf wound is actually larger and he does not have quite as good edema control. The area on the right anterior tibia is about the same still superficial. He states he is using his compression pumps once a day. We are using 3 layer compression on the left and Curlex Coban on the right 5/13; the area on the right is very close to being healed. The area on the left still has some depth but has a healthy wound surface. He has excellent edema control bilaterally. Using Hydrofera Blue 5/20-Patient returns in 1 week after being in 3 layer compression on the left, the left lateral calf ulcer is stable with some slough, the right anterior tibial ulcer is healed we have been using Kerlix Coban on that leg Electronic Signature(s) Signed: 09/20/2018 1:33:23 PM By: Tobi Bastos Entered By: Tobi Bastos on 09/20/2018 13:33:22 Steven Meza ([id number]) -------------------------------------------------------------------------------- Physical Exam Details Patient Name: Steven Meza Date of Service: 09/20/2018 1:00 PM Medical Record Number: [id number] Patient Account Number: [id number] Date of Birth/Sex: 1930/09/27 (85 y.o. M) Treating RN: Cornell Barman Primary Care Provider: Fulton Reek Other Clinician: Referring Provider: Fulton Reek Treating Provider/Extender: Beverly Gust in Treatment: 1 Constitutional alert and oriented x 3. sitting or standing blood pressure is within target range for patient.. supine blood pressure is within target range for patient.. pulse regular and  within target range for patient.Marland Kitchen respirations regular, non-labored and within target range for patient.Marland Kitchen temperature within target range for patient.. . . Well-nourished and well-hydrated in no acute distress. Notes The small left lateral calf ulcer has a layer of slough around the edges that was removed using a #3 curette. The surrounding skin appears intact. There is significant lymphedema noted in the leg. The right anterior tibial ulcer is healed. Electronic Signature(s) Signed: 09/20/2018 1:33:57 PM By: Tobi Bastos Entered By: Tobi Bastos on 09/20/2018 13:33:57 Steven Meza ([id number]) -------------------------------------------------------------------------------- Physician Orders Details Patient Name: Steven Meza Date of Service: 09/20/2018 1:00 PM Medical Record Number: [id number] Patient Account Number: [id number] Date of Birth/Sex: December 18, 1930 (85 y.o. M) Treating RN: Cornell Barman Primary Care Provider: Fulton Reek Other Clinician: Referring Provider: Fulton Reek Treating Provider/Extender: Beverly Gust in Treatment: 43 Verbal / Phone Orders: No Diagnosis Coding Wound Cleansing Wound #1 Left,Lateral Lower Leg o Clean wound with Normal Saline. Anesthetic (add to Medication List) Wound #1 Left,Lateral Lower Leg o Topical Lidocaine 4% cream applied to wound bed prior to debridement (In Clinic Only). Primary Wound Dressing Wound #1 Left,Lateral Lower Leg o Hydrafera Blue Ready Transfer Secondary Dressing Wound #1 Left,Lateral Lower Leg o ABD pad Dressing Change Frequency Wound #1 Left,Lateral Lower Leg o Change dressing every week o Other: - as needed Follow-up Appointments Wound #1 Left,Lateral Lower Leg o Return Appointment in 1 week. o Nurse Visit as needed - as needed  Edema Control Wound #1 Left,Lateral Lower Leg o 3 Layer Compression System - Left Lower Extremity o Patient to wear own compression stockings -  Right Leg o Compression Pump: Use compression pump on left lower extremity for 30 minutes, twice daily. Electronic Signature(s) Signed: 09/20/2018 4:16:13 PM By: Tobi Bastos Signed: 09/20/2018 5:05:53 PM By: Gretta Cool, BSN, RN, CWS, Kim RN, BSN Entered By: Gretta Cool, BSN, RN, CWS, Kim on 09/20/2018 13:32:05 Steven Meza ([id number]) -------------------------------------------------------------------------------- Progress Note Details Patient Name: Steven Meza Date of Service: 09/20/2018 1:00 PM Medical Record Number: [id number] Patient Account Number: [id number] Date of Birth/Sex: 01-04-1931 (85 y.o. M) Treating RN: Cornell Barman Primary Care Provider: Fulton Reek Other Clinician: Referring Provider: Fulton Reek Treating Provider/Extender: Beverly Gust in Treatment: 18 Subjective History of Present Illness (HPI) ADMISSION 05/17/2018 Mr. Bailey is an 85 year old man who is been treated at the lymphedema clinic for a long period with lymphedema wraps for bilateral lower extremity lymphedema. About 6 months ago they noted small open areas on his left lateral calf just above the ankle. These were open. They are apparently putting some form of ointment on this. They have been referred here for our review of this. The patient has a long history of lymphedema and venous stasis. It says in his records that he has recurrent cellulitis although his wife denies this but she states that the lymphedema may have started with cellulitis several years ago. Also in his record there is a history of bladder cancer which they seem to know very little about however he did not have any radiation to the pelvis or anything that could have contributed to lymphedema that they are aware of. There is no prior wound history. The patient has 2 small punched out areas on the left lateral calf that have adherent debris on the surface. Past medical history; lymphedema, hypertension, cellulitis, hearing  loss, morbid obesity, osteoarthritis, venous stasis, bladder CA, diverticulosis. ABIs in our clinic were 0.36 on the right and 0.57 on the left 05/24/2018; corrected age on this patient is 41 versus what I said last week. He has been for his arterial studies which predictably are not very good. On the right his ABI is 0.65. Monophasic waveforms noted at the right ankle including the posterior tibial and dorsalis pedis. He has triphasic waveforms of the common femoral and profunda femoris triphasic proximal SFA with monophasic distal FSA and popliteal artery. Monophasic tibial waveforms. On the left his ABI is 0.58 monophasic waveforms at the left ankle. Duplex of the left lower extremity demonstrated atherosclerotic change with monophasic waveforms throughout the left lower extremity. There was some concern about left iliac disease and potentially femoral-popliteal and tibial disease. The patient does not describe claudication although according to his wife he over emphasizes his activity. Patient states he is limited by pain in both knees His wounds are on the left lateral calf. 2 small areas with necrotic debris. We have been using silver collagen and light Ace wraps 05/31/2018; wounds on the left lateral calf in the setting of very significant lymphedema and probably PAD. We have been using silver collagen. The base of the small wound looks reasonably improved. I sent him to see Dr. Fletcher Anon however I see now he has an appointment with Dr. Alvester Chou on February 12 2/5; left lateral calf wound looks the same. His wife is doing a good job with her lymphedema wraps and maintaining that the edema. He most likely has PAD and is due to see Dr. Gwenlyn Found of  infectious disease on February 12 2/19; left lateral calf wounds look about the same. This looks like wound secondary to chronic venous insufficiency with lymphedema however the patient has very poor arterial studies. We referred him to Dr. Gwenlyn Found. Dr. Gwenlyn Found  did not feel he needed to do anything from an arterial point of view. He did not address the question about the aggressiveness of compression. After some thoughts about this I elected to go ahead and put him in 3 layer compression which after all would be less compression then the lymphedema clinic was putting on this. I am hopeful that this should be enough to get some closure of the small wounds 2/26; left lateral calf wound letter this week. He tolerated the 3 layer compression well furthermore he is sleeping in a hospital bed which helps keep his legs up at night. The wound looks better measuring smaller especially in width 3/3; left lateral calf wound about the same size. The 3 layer compression has really helped get the edema down in his leg however. Using silver collagen 3/11; left lateral calf wound better looking wound surface but about the same size. We have been using 3 layer compression which is really helped get the edema down in the left leg. Even after Dr. Kennon Holter assessment I am reluctant to go to 4 layer compression. He is tolerating 3 layer compression well. We have been using silver collagen 3/18-Patient returns for his left lateral calf wound which overall is looking better, slightly increased in size and dimensions and area, he is tolerating the 3 layer compression, has been dressed with silver collagen which we will continue 3/25 left lateral calf wound much better looking. Size is smaller. He has been using silver collagen 4/1; gradually getting smaller in size. Using silver collagen with 3 layer compression. I think the patient has some degree of PAD. He saw Dr. Gwenlyn Found in consultation, he did not specifically comment on whether he could tolerate 4 layer compression 4/8; the wound is slightly smaller in depth. We are using 3 layer compression with silver collagen. I had him seen for arterial insufficiency however his ABI was 0.57 in the left leg. Dr. Gwenlyn Found really did not seem  to middle on the degree of compression he could tolerate Steven Meza, Steven A. ([id number]) 4/15; the wound is slightly smaller and slightly less deep. We have been using 3 layer compression with silver collagen. There is not an option for 4-layer compression here. 4/22; the patient is making decent progress on his wound on the left lateral calf however he arrives in with a superficial wound on the right anterior tibial area. Were not really sure how this happened. He had been using a stocking on the right leg. He has been using silver collagen on the wounds Amazing to me he is he appears to have compression pumps at home. I am not really sure I knew this before. In any case he has not been using them 4/29; left lateral calf wound continues to get gradually smaller. This looks like it is on its way to closure. The area on the right anterior tibial area is about the same in terms of size superficial without any depth. This was probably wrap injury He tells me that he has been using his compression pumps once a day for 1 hour without any pain 5/6; not as good today. Left lateral calf wound is actually larger and he does not have quite as good edema control. The area on the right anterior  tibia is about the same still superficial. He states he is using his compression pumps once a day. We are using 3 layer compression on the left and Curlex Coban on the right 5/13; the area on the right is very close to being healed. The area on the left still has some depth but has a healthy wound surface. He has excellent edema control bilaterally. Using Hydrofera Blue 5/20-Patient returns in 1 week after being in 3 layer compression on the left, the left lateral calf ulcer is stable with some slough, the right anterior tibial ulcer is healed we have been using Kerlix Coban on that leg Objective Constitutional alert and oriented x 3. sitting or standing blood pressure is within target range for patient.. supine  blood pressure is within target range for patient.. pulse regular and within target range for patient.Marland Kitchen respirations regular, non-labored and within target range for patient.Marland Kitchen temperature within target range for patient.. Well-nourished and well-hydrated in no acute distress. Vitals Time Taken: 1:00 PM, Height: 71 in, Weight: 252 lbs, BMI: 35.1, Temperature: 97.8 F, Pulse: 67 bpm, Respiratory Rate: 20 breaths/min, Blood Pressure: 160/62 mmHg. General Notes: The small left lateral calf ulcer has a layer of slough around the edges that was removed using a #3 curette. The surrounding skin appears intact. There is significant lymphedema noted in the leg. The right anterior tibial ulcer is healed. Integumentary (Hair, Skin) Wound #1 status is Open. Original cause of wound was Gradually Appeared. The wound is located on the Left,Lateral Lower Leg. The wound measures 0.8cm length x 0.5cm width x 0.3cm depth; 0.314cm^2 area and 0.094cm^3 volume. There is Fat Layer (Subcutaneous Tissue) Exposed exposed. There is no tunneling or undermining noted. There is a medium amount of serous drainage noted. The wound margin is flat and intact. There is large (67-100%) pink granulation within the wound bed. There is a small (1-33%) amount of necrotic tissue within the wound bed including Adherent Slough. Wound #2 status is Healed - Epithelialized. Original cause of wound was Gradually Appeared. The wound is located on the Right,Anterior Lower Leg. The wound measures 0cm length x 0cm width x 0cm depth; 0cm^2 area and 0cm^3 volume. There is no tunneling or undermining noted. There is a none present amount of drainage noted. The wound margin is flat and intact. There is large (67-100%) pink granulation within the wound bed. There is no necrotic tissue within the wound bed. Procedures Wound #1 Pre-procedure diagnosis of Wound #1 is a Lymphedema located on the Left,Lateral Lower Leg . There was a Selective/Open Wound  Non-Viable Tissue Debridement with a total area of 0.4 sq cm performed by Tobi Bastos, MD. With the following instrument(s): Curette to remove Non-Viable tissue/material. Material removed includes [facility] after achieving pain control using Lidocaine. No specimens were taken. A time out was conducted at 13:29, prior to the start of the procedure. There was no bleeding. The procedure was tolerated well. Post Debridement Measurements: 0.8cm length x 0.5cm width x 0.3cm depth; TRESHUN, WOLD A. ([id number]) 0.094cm^3 volume. Character of Wound/Ulcer Post Debridement requires further debridement. Post procedure Diagnosis Wound #1: Same as Pre-Procedure Plan Wound Cleansing: Wound #1 Left,Lateral Lower Leg: Clean wound with Normal Saline. Anesthetic (add to Medication List): Wound #1 Left,Lateral Lower Leg: Topical Lidocaine 4% cream applied to wound bed prior to debridement (In Clinic Only). Primary Wound Dressing: Wound #1 Left,Lateral Lower Leg: Hydrafera Blue Ready Transfer Secondary Dressing: Wound #1 Left,Lateral Lower Leg: ABD pad Dressing Change Frequency: Wound #1 Left,Lateral Lower Leg:  Change dressing every week Other: - as needed Follow-up Appointments: Wound #1 Left,Lateral Lower Leg: Return Appointment in 1 week. Nurse Visit as needed - as needed Edema Control: Wound #1 Left,Lateral Lower Leg: 3 Layer Compression System - Left Lower Extremity Patient to wear own compression stockings - Right Leg Compression Pump: Use compression pump on left lower extremity for 30 minutes, twice daily. 1. Continue using Hydrofera Blue dressing under 3 layer compression 2. Return to clinic next week Electronic Signature(s) Signed: 09/20/2018 1:34:28 PM By: Tobi Bastos Entered By: Tobi Bastos on 09/20/2018 13:34:27 Steven Meza ([id number]) -------------------------------------------------------------------------------- SuperBill Details Patient Name: Steven Meza Date of Service: 09/20/2018 Medical Record Number: [id number] Patient Account Number: [id number] Date of Birth/Sex: [birth year] (85 y.o. 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 of native arteries of extremities with intermittent claudication, left leg L97.211 Non-pressure chronic ulcer of right calf limited to breakdown of skin Facility Procedures CPT4 Meza: [id number] Description: 57897 - DEBRIDE WOUND 1ST 20 SQ CM OR < ICD-10 Diagnosis Description L97.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

## 2018-09-27 ENCOUNTER — Encounter: Payer: Medicare Other | Admitting: Internal Medicine

## 2018-09-27 ENCOUNTER — Other Ambulatory Visit: Payer: Self-pay

## 2018-09-27 DIAGNOSIS — I70212 Atherosclerosis of native arteries of extremities with intermittent claudication, left leg: Secondary | ICD-10-CM | POA: Diagnosis not present

## 2018-10-04 ENCOUNTER — Encounter: Payer: Medicare Other | Attending: Internal Medicine | Admitting: Internal Medicine

## 2018-10-04 ENCOUNTER — Other Ambulatory Visit: Payer: Self-pay

## 2018-10-04 DIAGNOSIS — Z8551 Personal history of malignant neoplasm of bladder: Secondary | ICD-10-CM | POA: Insufficient documentation

## 2018-10-04 DIAGNOSIS — M1990 Unspecified osteoarthritis, unspecified site: Secondary | ICD-10-CM | POA: Diagnosis not present

## 2018-10-04 DIAGNOSIS — I890 Lymphedema, not elsewhere classified: Secondary | ICD-10-CM | POA: Diagnosis not present

## 2018-10-04 DIAGNOSIS — L97228 Non-pressure chronic ulcer of left calf with other specified severity: Secondary | ICD-10-CM | POA: Diagnosis not present

## 2018-10-04 DIAGNOSIS — Z6835 Body mass index (BMI) 35.0-35.9, adult: Secondary | ICD-10-CM | POA: Insufficient documentation

## 2018-10-04 DIAGNOSIS — I872 Venous insufficiency (chronic) (peripheral): Secondary | ICD-10-CM | POA: Diagnosis not present

## 2018-10-04 DIAGNOSIS — I70212 Atherosclerosis of native arteries of extremities with intermittent claudication, left leg: Secondary | ICD-10-CM | POA: Insufficient documentation

## 2018-10-04 DIAGNOSIS — L97211 Non-pressure chronic ulcer of right calf limited to breakdown of skin: Secondary | ICD-10-CM | POA: Insufficient documentation

## 2018-10-04 DIAGNOSIS — I10 Essential (primary) hypertension: Secondary | ICD-10-CM | POA: Diagnosis not present

## 2018-10-04 NOTE — Progress Notes (Signed)
Steven Meza, Steven Meza ([id number]) Visit Report for 09/27/2018 Arrival Information Details Patient Name: Steven Meza, Steven Meza Date of Service: 09/27/2018 3:45 PM Medical Record Number: [id number] Patient Account Number: [id number] Date of Birth/Sex: January 30, 1931 (85 y.o. M) Treating RN: Harold Barban Primary Care Duard Spiewak: Fulton Reek Other Clinician: Referring Delvin Hedeen: Fulton Reek Treating Vernon Maish/Extender: Tito Dine in Treatment: 11 Visit Information History Since Last Visit Added or deleted any medications: No Patient Arrived: Cane Any new allergies or adverse reactions: No Arrival Time: 16:08 Had a fall or experienced change in No Accompanied By: self activities of daily living that may affect Transfer Assistance: None risk of falls: Patient Identification Verified: Yes Signs or symptoms of abuse/neglect since last visito No Secondary Verification Process Completed: Yes Hospitalized since last visit: No Has Dressing in Place as Prescribed: Yes Has Compression in Place as Prescribed: Yes Pain Present Now: No Electronic Signature(s) Signed: 09/27/2018 4:36:37 PM By: Harold Barban Entered By: Harold Barban on 09/27/2018 16:08:33 Steven Meza ([id number]) -------------------------------------------------------------------------------- Lower Extremity Assessment Details Patient Name: Steven Meza Date of Service: 09/27/2018 3:45 PM Medical Record Number: [id number] Patient Account Number: [id number] Date of Birth/Sex: Sep 28, 1930 (85 y.o. M) Treating RN: Harold Barban Primary Care Autumn Pruitt: Fulton Reek Other Clinician: Referring Maleko Greulich: Fulton Reek Treating Jazzlyn Huizenga/Extender: Tito Dine in Treatment: 19 Edema Assessment Assessed: [Left: No] [Right: No] [Left: Edema] [Right: :] Calf Left: Right: Point of Measurement: 32 cm From Medial Instep 42.7 cm cm Ankle Left: Right: Point of Measurement: 13 cm From Medial Instep 27.7 cm  cm Vascular Assessment Pulses: Dorsalis Pedis Palpable: [Left:Yes] Posterior Tibial Palpable: [Left:Yes] Electronic Signature(s) Signed: 09/27/2018 4:36:37 PM By: Harold Barban Entered By: Harold Barban on 09/27/2018 16:24:39 Steven Meza ([id number]) -------------------------------------------------------------------------------- Multi Wound Chart Details Patient Name: Steven Meza Date of Service: 09/27/2018 3:45 PM Medical Record Number: [id number] Patient Account Number: [id number] Date of Birth/Sex: 1930-06-23 (85 y.o. M) Treating RN: Cornell Barman Primary Care Leatha Rohner: Fulton Reek Other Clinician: Referring Nazire Fruth: Fulton Reek Treating Deloris Mittag/Extender: Tito Dine in Treatment: 19 Vital Signs Height(in): 71 Pulse(bpm): 7 Weight(lbs): 252 Blood Pressure(mmHg): 155/83 Body Mass Index(BMI): 35 Temperature(F): 98.6 Respiratory Rate 18 (breaths/min): Photos: [N/A:N/A] Wound Location: Left Lower Leg - Lateral N/A N/A Wounding Event: Gradually Appeared N/A N/A Primary Etiology: Lymphedema N/A N/A Comorbid History: Lymphedema, Coronary Artery N/A N/A Disease, Hypertension Date Acquired: 11/14/2017 N/A N/A Weeks of Treatment: 19 N/A N/A Wound Status: Open N/A N/A Clustered Wound: Yes N/A N/A Measurements L x W x D 0.3x0.2x0.2 N/A N/A (cm) Area (cm) : 0.047 N/A N/A Volume (cm) : 0.009 N/A N/A % Reduction in Area: 98.50% N/A N/A % Reduction in Volume: 97.10% N/A N/A Classification: Full Thickness Without N/A N/A Exposed Support Structures Exudate Amount: Medium N/A N/A Exudate Type: Serous N/A N/A Exudate Color: amber N/A N/A Wound Margin: Flat and Intact N/A N/A Granulation Amount: Large (67-100%) N/A N/A Granulation Quality: Pink N/A N/A Necrotic Amount: Small (1-33%) N/A N/A Exposed Structures: Fat Layer (Subcutaneous N/A N/A Tissue) Exposed: Yes Epithelialization: None N/A N/A Debridement: Debridement - Selective/Open N/A  N/A Wound Steven Meza, Steven Meza ([id number]) Pre-procedure 16:32 N/A N/A Verification/Time Out Taken: Pain Control: Lidocaine N/A N/A Level: Non-Viable Tissue N/A N/A Debridement Area (sq cm): 0.06 N/A N/A Instrument: Curette N/A N/A Bleeding: None N/A N/A Debridement Treatment Procedure was tolerated well N/A N/A Response: Post Debridement 0.3x0.2x0.2 N/A N/A Measurements L x W x D (cm) Post Debridement Volume: 0.009 N/A N/A (cm) Procedures Performed: Debridement N/A N/A  Treatment Notes Electronic Signature(s) Signed: 09/27/2018 5:01:45 PM By: Linton Ham MD Entered By: Linton Ham on 09/27/2018 16:41:00 Steven Meza ([id number]) -------------------------------------------------------------------------------- Multi-Disciplinary Care Plan Details Patient Name: Steven Meza Date of Service: 09/27/2018 3:45 PM Medical Record Number: [id number] Patient Account Number: [id number] Date of Birth/Sex: Sep 06, 1930 (85 y.o. M) Treating RN: Cornell Barman Primary Care Imraan Wendell: Fulton Reek Other Clinician: Referring Shatyra Becka: Fulton Reek Treating Lareina Espino/Extender: Tito Dine in Treatment: 15 Active Inactive Orientation to the Wound Care Program Nursing Diagnoses: Knowledge deficit related to the wound healing center program Goals: Patient/caregiver will verbalize understanding of the [place] Date Initiated: 05/17/2018 Target Resolution Date: 06/17/2018 Goal Status: Active Interventions: Provide education on orientation to the wound center Notes: Venous Leg Ulcer Nursing Diagnoses: Potential for venous Insuffiency (use before diagnosis confirmed) Goals: Patient/caregiver will verbalize understanding of disease process and disease management Date Initiated: 05/17/2018 Target Resolution Date: 06/17/2018 Goal Status: Active Interventions: Assess peripheral edema status every visit. Treatment Activities: Non-invasive vascular  studies : 05/17/2018 Notes: Wound/Skin Impairment Nursing Diagnoses: Impaired tissue integrity Goals: Patient/caregiver will verbalize understanding of skin care regimen Date Initiated: 05/17/2018 Target Resolution Date: 05/17/2018 Goal Status: Active Steven Meza, Steven Meza ([id number]) Ulcer/skin breakdown will heal within 14 weeks Date Initiated: 05/17/2018 Target Resolution Date: 08/16/2018 Goal Status: Active Interventions: Assess ulceration(s) every visit Treatment Activities: Referred to DME Leonor Darnell for dressing supplies : 05/17/2018 Topical wound management initiated : 05/17/2018 Notes: Electronic Signature(s) Signed: 10/03/2018 5:54:26 PM By: Gretta Cool, BSN, RN, CWS, Kim RN, BSN Entered By: Gretta Cool, BSN, RN, CWS, Kim on 09/27/2018 16:32:04 Steven Meza ([id number]) -------------------------------------------------------------------------------- Pain Assessment Details Patient Name: Steven Meza Date of Service: 09/27/2018 3:45 PM Medical Record Number: [id number] Patient Account Number: [id number] Date of Birth/Sex: 1930/08/16 (85 y.o. M) Treating RN: Harold Barban Primary Care Marielouise Amey: Fulton Reek Other Clinician: Referring Shulamis Wenberg: Fulton Reek Treating Aneya Daddona/Extender: Tito Dine in Treatment: 37 Active Problems Location of Pain Severity and Description of Pain Patient Has Paino No Site Locations Pain Management and Medication Current Pain Management: Electronic Signature(s) Signed: 09/27/2018 4:36:37 PM By: Harold Barban Entered By: Harold Barban on 09/27/2018 16:08:52 Steven Meza ([id number]) -------------------------------------------------------------------------------- Patient/Caregiver Education Details Patient Name: Steven Meza Date of Service: 09/27/2018 3:45 PM Medical Record Number: [id number] Patient Account Number: [id number] Date of Birth/Gender: Nov 29, 1930 (85 y.o. M) Treating RN: Cornell Barman Primary Care Physician:  Fulton Reek Other Clinician: Referring Physician: Fulton Reek Treating Physician/Extender: Tito Dine in Treatment: 15 Education Assessment Education Provided To: Patient Education Topics Provided Wound/Skin Impairment: Handouts: Caring for Your Ulcer Methods: Demonstration, Explain/Verbal Responses: State content correctly Electronic Signature(s) Signed: 10/03/2018 5:54:26 PM By: Gretta Cool, BSN, RN, CWS, Kim RN, BSN Entered By: Gretta Cool, BSN, RN, CWS, Kim on 09/27/2018 16:34:38 Steven Meza ([id number]) -------------------------------------------------------------------------------- Wound Assessment Details Patient Name: Steven Meza Date of Service: 09/27/2018 3:45 PM Medical Record Number: [id number] Patient Account Number: [id number] Date of Birth/Sex: 1931-01-20 (85 y.o. M) Treating RN: Harold Barban Primary Care Bushra Denman: Fulton Reek Other Clinician: Referring Lexandra Rettke: Fulton Reek Treating Liliana Brentlinger/Extender: Tito Dine in Treatment: 19 Wound Status Wound Number: 1 Primary Lymphedema Etiology: Wound Location: Left Lower Leg - Lateral Wound Status: Open Wounding Event: Gradually Appeared Comorbid Lymphedema, Coronary Artery Disease, Date Acquired: 11/14/2017 History: Hypertension Weeks Of Treatment: 19 Clustered Wound: Yes Photos Wound Measurements Length: (cm) 0.3 Width: (cm) 0.2 Depth: (cm) 0.2 Area: (cm) 0.047 Volume: (cm) 0.009 % Reduction in Area: 98.5% % Reduction in Volume: 97.1%  Epithelialization: None Tunneling: No Undermining: No Wound Description Full Thickness Without Exposed Support Foul O Classification: Structures Slough Wound Margin: Flat and Intact Exudate Medium Amount: Exudate Type: Serous Exudate Color: amber dor After Cleansing: No /Fibrino Yes Wound Bed Granulation Amount: Large (67-100%) Exposed Structure Granulation Quality: Pink Fat Layer (Subcutaneous Tissue) Exposed:  Yes Necrotic Amount: Small (1-33%) Necrotic Quality: Adherent Slough Treatment Notes Wound #1 (Left, Lateral Lower Leg) Steven Meza, Steven Meza ([id number]) Notes silvercel, ABD, 3 layer left, Electronic Signature(s) Signed: 09/27/2018 4:36:37 PM By: Harold Barban Entered By: Harold Barban on 09/27/2018 16:22:49 Steven Meza ([id number]) -------------------------------------------------------------------------------- Vitals Details Patient Name: Steven Meza Date of Service: 09/27/2018 3:45 PM Medical Record Number: [id number] Patient Account Number: [id number] Date of Birth/Sex: [birth year] (85 y.o. M) Treating RN: Harold Barban Primary Care Darnise Montag: Fulton Reek Other Clinician: Referring Amoreena Neubert: Fulton Reek Treating Wendy Hoback/Extender: Tito Dine in Treatment: 19 Vital Signs Time Taken: 16:08 Temperature (F): 98.6 Height (in): 71 Pulse (bpm): 82 Weight (lbs): 252 Respiratory Rate (breaths/min): 18 Body Mass Index (BMI): 35.1 Blood Pressure (mmHg): 155/83 Reference Range: 80 - 120 mg / dl Electronic Signature(s) Signed: 09/27/2018 4:36:37 PM By: Harold Barban Entered By: Harold Barban on 09/27/2018 16:09:40

## 2018-10-04 NOTE — Progress Notes (Signed)
Steven Meza, Steven Meza ([id number]) Visit Report for 09/27/2018 Debridement Details Patient Name: Steven Meza, Steven Meza Date of Service: 09/27/2018 3:45 PM Medical Record Number: [id number] Patient Account Number: [id number] Date of Birth/Sex: 12/01/1930 (85 y.o. M) Treating RN: Cornell Barman Primary Care Provider: Fulton Reek Other Clinician: Referring Provider: Fulton Reek Treating Provider/Extender: Tito Dine in Treatment: 19 Debridement Performed for Wound #1 Left,Lateral Lower Leg Assessment: Performed By: Physician Ricard Dillon, MD Debridement Type: Debridement Level of Consciousness (Pre- Awake and Alert procedure): Pre-procedure Verification/Time Yes - 16:32 Out Taken: Start Time: 16:32 Pain Control: Lidocaine Total Area Debrided (L x W): 0.3 (cm) x 0.2 (cm) = 0.06 (cm) Tissue and other material Non-Viable, Fibrin/Exudate debrided: Level: Non-Viable Tissue Debridement Description: Selective/Open Wound Instrument: Curette Bleeding: None End Time: 16:33 Response to Treatment: Procedure was tolerated well Level of Consciousness Awake and Alert (Post-procedure): Post Debridement Measurements of Total Wound Length: (cm) 0.3 Width: (cm) 0.2 Depth: (cm) 0.2 Volume: (cm) 0.009 Character of Wound/Ulcer Post Debridement: Stable Post Procedure Diagnosis Same as Pre-procedure Electronic Signature(s) Signed: 09/27/2018 5:01:45 PM By: Linton Ham MD Signed: 10/03/2018 5:54:26 PM By: Gretta Cool, BSN, RN, CWS, Kim RN, BSN Entered By: Linton Ham on 09/27/2018 16:41:13 Steven Meza ([id number]) -------------------------------------------------------------------------------- HPI Details Patient Name: Steven Meza Date of Service: 09/27/2018 3:45 PM Medical Record Number: [id number] Patient Account Number: [id number] Date of Birth/Sex: [birth year] (85 y.o. M) Treating RN: Cornell Barman Primary Care Provider: Fulton Reek Other Clinician: Referring  Provider: Fulton Reek Treating Provider/Extender: Tito Dine in Treatment: 89 History of Present Illness HPI Description: ADMISSION 05/17/2018 Mr. Hollenbeck is an 85 year old man who is been treated at the lymphedema clinic for a long period with lymphedema wraps for bilateral lower extremity lymphedema. About 6 months ago they noted small open areas on his left lateral calf just above the ankle. These were open. They are apparently putting some form of ointment on this. They have been referred here for our review of this. The patient has a long history of lymphedema and venous stasis. It says in his records that he has recurrent cellulitis although his wife denies this but she states that the lymphedema may have started with cellulitis several years ago. Also in his record there is a history of bladder cancer which they seem to know very little about however he did not have any radiation to the pelvis or anything that could have contributed to lymphedema that they are aware of. There is no prior wound history. The patient has 2 small punched out areas on the left lateral calf that have adherent debris on the surface. Past medical history; lymphedema, hypertension, cellulitis, hearing loss, morbid obesity, osteoarthritis, venous stasis, bladder CA, diverticulosis. ABIs in our clinic were 0.36 on the right and 0.57 on the left 05/24/2018; corrected age on this patient is 34 versus what I said last week. He has been for his arterial studies which predictably are not very good. On the right his ABI is 0.65. Monophasic waveforms noted at the right ankle including the posterior tibial and dorsalis pedis. He has triphasic waveforms of the common femoral and profunda femoris triphasic proximal SFA with monophasic distal FSA and popliteal artery. Monophasic tibial waveforms. On the left his ABI is 0.58 monophasic waveforms at the left ankle. Duplex of the left lower extremity demonstrated  atherosclerotic change with monophasic waveforms throughout the left lower extremity. There was some concern about left iliac disease and potentially femoral-popliteal and tibial disease. The patient does not  describe claudication although according to his wife he over emphasizes his activity. Patient states he is limited by pain in both knees His wounds are on the left lateral calf. 2 small areas with necrotic debris. We have been using silver collagen and light Ace wraps 05/31/2018; wounds on the left lateral calf in the setting of very significant lymphedema and probably PAD. We have been using silver collagen. The base of the small wound looks reasonably improved. I sent him to see Dr. Fletcher Anon however I see now he has an appointment with Dr. Alvester Chou on February 12 2/5; left lateral calf wound looks the same. His wife is doing a good job with her lymphedema wraps and maintaining that the edema. He most likely has PAD and is due to see Dr. Gwenlyn Found of infectious disease on February 12 2/19; left lateral calf wounds look about the same. This looks like wound secondary to chronic venous insufficiency with lymphedema however the patient has very poor arterial studies. We referred him to Dr. Gwenlyn Found. Dr. Gwenlyn Found did not feel he needed to do anything from an arterial point of view. He did not address the question about the aggressiveness of compression. After some thoughts about this I elected to go ahead and put him in 3 layer compression which after all would be less compression then the lymphedema clinic was putting on this. I am hopeful that this should be enough to get some closure of the small wounds 2/26; left lateral calf wound letter this week. He tolerated the 3 layer compression well furthermore he is sleeping in a hospital bed which helps keep his legs up at night. The wound looks better measuring smaller especially in width 3/3; left lateral calf wound about the same size. The 3 layer compression  has really helped get the edema down in his leg however. Using silver collagen 3/11; left lateral calf wound better looking wound surface but about the same size. We have been using 3 layer compression which is really helped get the edema down in the left leg. Even after Dr. Kennon Holter assessment I am reluctant to go to 4 layer compression. He is tolerating 3 layer compression well. We have been using silver collagen ALTER, MOSS ([id number]) 3/18-Patient returns for his left lateral calf wound which overall is looking better, slightly increased in size and dimensions and area, he is tolerating the 3 layer compression, has been dressed with silver collagen which we will continue 3/25oleft lateral calf wound much better looking. Size is smaller. He has been using silver collagen 4/1; gradually getting smaller in size. Using silver collagen with 3 layer compression. I think the patient has some degree of PAD. He saw Dr. Gwenlyn Found in consultation, he did not specifically comment on whether he could tolerate 4 layer compression 4/8; the wound is slightly smaller in depth. We are using 3 layer compression with silver collagen. I had him seen for arterial insufficiency however his ABI was 0.57 in the left leg. Dr. Gwenlyn Found really did not seem to middle on the degree of compression he could tolerate 4/15; the wound is slightly smaller and slightly less deep. We have been using 3 layer compression with silver collagen. There is not an option for 4-layer compression here. 4/22; the patient is making decent progress on his wound on the left lateral calf however he arrives in with a superficial wound on the right anterior tibial area. Were not really sure how this happened. He had been using a stocking on the right  leg. He has been using silver collagen on the wounds Amazing to me he is he appears to have compression pumps at home. I am not really sure I knew this before. In any case he has not been using  them 4/29; left lateral calf wound continues to get gradually smaller. This looks like it is on its way to closure. oThe area on the right anterior tibial area is about the same in terms of size superficial without any depth. This was probably wrap injury oHe tells me that he has been using his compression pumps once a day for 1 hour without any pain 5/6; not as good today. Left lateral calf wound is actually larger and he does not have quite as good edema control. The area on the right anterior tibia is about the same still superficial. He states he is using his compression pumps once a day. We are using 3 layer compression on the left and Curlex Coban on the right 5/13; the area on the right is very close to being healed. The area on the left still has some depth but has a healthy wound surface. He has excellent edema control bilaterally. Using Hydrofera Blue 5/20-Patient returns in 1 week after being in 3 layer compression on the left, the left lateral calf ulcer is stable with some slough, the right anterior tibial ulcer is healed we have been using Kerlix Coban on that leg 5/27; Hydrofera Blue and 3 layer compression. Still some slough in the wound area and some nonviable edges around the wound. Electronic Signature(s) Signed: 09/27/2018 5:01:45 PM By: Linton Ham MD Entered By: Linton Ham on 09/27/2018 16:41:44 Steven Meza ([id number]) -------------------------------------------------------------------------------- Physical Exam Details Patient Name: Steven Meza Date of Service: 09/27/2018 3:45 PM Medical Record Number: [id number] Patient Account Number: [id number] Date of Birth/Sex: June 19, 1930 (85 y.o. M) Treating RN: Cornell Barman Primary Care Provider: Fulton Reek Other Clinician: Referring Provider: Fulton Reek Treating Provider/Extender: Tito Dine in Treatment: 51 Constitutional Patient is hypertensive.. Pulse regular and within target range  for patient.Marland Kitchen Respirations regular, non-labored and within target range.. Temperature is normal and within the target range for the patient.Marland Kitchen appears in no distress. Eyes Conjunctivae clear. No discharge. Notes Wound exam; the small left lateral calf ulcer has a layer of slough over the wound and some dry skin around the circumference although this was removed with a #3 curette. The base of the wound has only a small open area remaining. Electronic Signature(s) Signed: 09/27/2018 5:01:45 PM By: Linton Ham MD Entered By: Linton Ham on 09/27/2018 16:43:32 Steven Meza ([id number]) -------------------------------------------------------------------------------- Physician Orders Details Patient Name: Steven Meza Date of Service: 09/27/2018 3:45 PM Medical Record Number: [id number] Patient Account Number: [id number] Date of Birth/Sex: 1931-03-14 (85 y.o. M) Treating RN: Cornell Barman Primary Care Provider: Fulton Reek Other Clinician: Referring Provider: Fulton Reek Treating Provider/Extender: Tito Dine in Treatment: 42 Verbal / Phone Orders: No Diagnosis Coding Wound Cleansing Wound #1 Left,Lateral Lower Leg o Clean wound with Normal Saline. Anesthetic (add to Medication List) Wound #1 Left,Lateral Lower Leg o Topical Lidocaine 4% cream applied to wound bed prior to debridement (In Clinic Only). Primary Wound Dressing Wound #1 Left,Lateral Lower Leg o Silver Alginate Secondary Dressing Wound #1 Left,Lateral Lower Leg o ABD pad Dressing Change Frequency Wound #1 Left,Lateral Lower Leg o Change dressing every week o Other: - as needed Follow-up Appointments Wound #1 Left,Lateral Lower Leg o Return Appointment in 1 week. o Nurse Visit as  needed - as needed Edema Control Wound #1 Left,Lateral Lower Leg o 3 Layer Compression System - Left Lower Extremity o Patient to wear own compression stockings - Right Leg o Compression  Pump: Use compression pump on left lower extremity for 30 minutes, twice daily. Electronic Signature(s) Signed: 09/27/2018 5:01:45 PM By: Linton Ham MD Signed: 10/03/2018 5:54:26 PM By: Gretta Cool, BSN, RN, CWS, Kim RN, BSN Entered By: Gretta Cool, BSN, RN, CWS, Kim on 09/27/2018 16:34:15 Steven Meza ([id number]) -------------------------------------------------------------------------------- Problem List Details Patient Name: Steven Meza Date of Service: 09/27/2018 3:45 PM Medical Record Number: [id number] Patient Account Number: [id number] Date of Birth/Sex: 17-Apr-1931 (85 y.o. 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 M) Treating RN: Cornell Barman Primary Care Provider: Fulton Reek Other Clinician: Referring Provider: Fulton Reek Treating Provider/Extender: Tito Dine in Treatment: 19 Subjective History  of Present Illness (HPI) ADMISSION 05/17/2018 Mr. Dredge is an 85 year old man who is been treated at the lymphedema clinic for a long period with lymphedema wraps for bilateral lower extremity lymphedema. About 6 months ago they noted small open areas on his left lateral calf just above the ankle. These were open. They are apparently putting some form of ointment on this. They have been referred here for our review of this. The patient has a long history of lymphedema and venous stasis. It says in his records that he has recurrent cellulitis although his wife denies this but she states that the lymphedema may have started with cellulitis several years ago. Also in his record there is a history of bladder cancer which they seem to know very little about however he did not have any radiation to the pelvis or anything that could have contributed to lymphedema that they are aware of. There is no prior wound history. The patient has 2 small punched out areas on the left lateral calf that have adherent debris on the surface. Past medical history; lymphedema, hypertension, cellulitis, hearing loss, morbid obesity, osteoarthritis, venous stasis, bladder CA, diverticulosis. ABIs in our clinic were 0.36 on the right and 0.57 on the left 05/24/2018; corrected age on this patient is 62 versus what I said last week. He has been for his arterial studies which predictably are not very good. On the right his ABI is 0.65. Monophasic waveforms noted at the right ankle including the posterior tibial and dorsalis pedis. He has triphasic waveforms of the common femoral and profunda femoris triphasic proximal SFA with monophasic distal FSA and popliteal artery. Monophasic tibial waveforms. On the left his ABI is 0.58 monophasic waveforms at the left ankle. Duplex of the left lower extremity demonstrated atherosclerotic change with monophasic waveforms throughout the left lower extremity. There was some concern about  left iliac disease and potentially femoral-popliteal and tibial disease. The patient does not describe claudication although according to his wife he over emphasizes his activity. Patient states he is limited by pain in both knees His wounds are on the left lateral calf. 2 small areas with necrotic debris. We have been using silver collagen and light Ace wraps 05/31/2018; wounds on the left lateral calf in the setting of very significant lymphedema and probably PAD. We have been using silver collagen. The base of the small wound looks reasonably improved. I sent him to see Dr. Fletcher Anon however I see now he has an appointment with Dr. Alvester Chou on February 12 2/5; left lateral calf wound looks the same. His wife is doing a good job with her lymphedema wraps and maintaining that the edema. He most likely has PAD and is due to see Dr. Gwenlyn Found of infectious disease on February 12 2/19; left lateral calf wounds look about the same. This looks like wound secondary to chronic venous insufficiency with lymphedema however the patient has very poor arterial studies. We referred him to Dr. Gwenlyn Found. Dr. Gwenlyn Found did not feel he needed to do anything from an arterial point of view. He did not address the question about the aggressiveness of compression. After some thoughts about this I elected to go ahead and put him in 3 layer compression which after all would be less compression then the lymphedema clinic was putting on this. I am hopeful that this should be enough to get some closure of the small wounds 2/26; left lateral calf wound letter this week. He tolerated the 3 layer compression well furthermore he is sleeping in a hospital bed which helps keep his legs up at night. The wound looks better measuring smaller especially in width 3/3; left lateral calf wound about the same size. The 3 layer compression has really helped get the edema down in his leg however. Using silver collagen 3/11; left lateral calf wound better  looking wound surface but about the same size. We have been using 3 layer compression which is really helped get the edema down in the left leg. Even after Dr. Kennon Holter assessment I am reluctant to go to 4 layer Steven Meza, Steven A. ([id number]) compression. He is tolerating 3 layer compression well. We have been using silver collagen 3/18-Patient returns for his left lateral calf wound which overall is looking better, slightly increased in size and dimensions and area, he is tolerating the 3 layer compression, has been dressed with silver collagen which we will continue 3/25 left lateral calf wound much better looking. Size is smaller. He has been using silver collagen 4/1; gradually getting smaller in size. Using silver collagen with 3 layer compression. I think the patient has some degree of PAD. He saw Dr. Gwenlyn Found in consultation, he did not specifically comment on whether he could tolerate 4 layer compression 4/8; the wound is slightly smaller in depth. We are using 3 layer compression with silver collagen. I had him seen for arterial insufficiency however his ABI was 0.57 in the left leg. Dr. Gwenlyn Found really did not seem to middle on the degree of compression he could tolerate 4/15; the wound is slightly smaller and slightly less deep. We have been using 3 layer compression with silver collagen. There is not an option for 4-layer compression here. 4/22; the patient is making decent progress on his wound on the left lateral calf however he arrives in with a superficial wound on the right anterior tibial area. Were not  really sure how this happened. He had been using a stocking on the right leg. He has been using silver collagen on the wounds Amazing to me he is he appears to have compression pumps at home. I am not really sure I knew this before. In any case he has not been using them 4/29; left lateral calf wound continues to get gradually smaller. This looks like it is on its way to closure. The  area on the right anterior tibial area is about the same in terms of size superficial without any depth. This was probably wrap injury He tells me that he has been using his compression pumps once a day for 1 hour without any pain 5/6; not as good today. Left lateral calf wound is actually larger and he does not have quite as good edema control. The area on the right anterior tibia is about the same still superficial. He states he is using his compression pumps once a day. We are using 3 layer compression on the left and Curlex Coban on the right 5/13; the area on the right is very close to being healed. The area on the left still has some depth but has a healthy wound surface. He has excellent edema control bilaterally. Using Hydrofera Blue 5/20-Patient returns in 1 week after being in 3 layer compression on the left, the left lateral calf ulcer is stable with some slough, the right anterior tibial ulcer is healed we have been using Kerlix Coban on that leg 5/27; Hydrofera Blue and 3 layer compression. Still some slough in the wound area and some nonviable edges around the wound. Objective Constitutional Patient is hypertensive.. Pulse regular and within target range for patient.Marland Kitchen Respirations regular, non-labored and within target range.. Temperature is normal and within the target range for the patient.Marland Kitchen appears in no distress. Vitals Time Taken: 4:08 PM, Height: 71 in, Weight: 252 lbs, BMI: 35.1, Temperature: 98.6 F, Pulse: 82 bpm, Respiratory Rate: 18 breaths/min, Blood Pressure: 155/83 mmHg. Eyes Conjunctivae clear. No discharge. General Notes: Wound exam; the small left lateral calf ulcer has a layer of slough over the wound and some dry skin around the circumference although this was removed with a #3 curette. The base of the wound has only a small open area remaining. Integumentary (Hair, Skin) Wound #1 status is Open. Original cause of wound was Gradually Appeared. The wound is  located on the Left,Lateral Lower Leg. The wound measures 0.3cm length x 0.2cm width x 0.2cm depth; 0.047cm^2 area and 0.009cm^3 volume. There is Fat Steven Meza, Steven Meza ([id number]) Layer (Subcutaneous Tissue) Exposed exposed. There is no tunneling or undermining noted. There is a medium amount of serous drainage noted. The wound margin is flat and intact. There is large (67-100%) pink granulation within the wound bed. There is a small (1-33%) amount of necrotic tissue within the wound bed including Adherent Slough. 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 There was a Selective/Open Wound Non-Viable Tissue Debridement with a total area of 0.06 sq cm performed by Ricard Dillon, MD. With the following instrument(s): Curette to remove Non-Viable tissue/material. Material removed includes Fibrin/Exudate after achieving pain control using Lidocaine. A time out was conducted at 16:32, prior  to the start of the procedure. There was no bleeding. The procedure was tolerated well. Post Debridement Measurements: 0.3cm length x 0.2cm width x 0.2cm depth; 0.009cm^3 volume. Character of Wound/Ulcer Post Debridement is stable. Post procedure Diagnosis Wound #1: Same as Pre-Procedure Plan Wound Cleansing: Wound #1 Left,Lateral Lower Leg: Clean wound with Normal Saline. Anesthetic (add to Medication List): Wound #1 Left,Lateral Lower Leg: Topical Lidocaine 4% cream applied to wound bed prior to debridement (In Clinic Only). Primary Wound Dressing: Wound #1 Left,Lateral Lower Leg: Silver Alginate Secondary Dressing: Wound #1  Left,Lateral Lower Leg: ABD pad Dressing Change Frequency: Wound #1 Left,Lateral Lower Leg: Change dressing every week Other: - as needed Follow-up Appointments: Steven Meza, Steven Meza ([id number]) Wound #1 Left,Lateral Lower Leg: Return Appointment in 1 week. Nurse Visit as needed - as needed Edema Control: Wound #1 Left,Lateral Lower Leg: 3 Layer Compression System - Left Lower Extremity Patient to wear own compression stockings - Right Leg Compression Pump: Use compression pump on left lower extremity for 30 minutes, twice daily. 1. I change the primary dressing from Hydrofera Blue to silver alginate under 3 layer compression 2. I am cautiously optimistic that this will be closed by next week Electronic Signature(s) Signed: 09/27/2018 5:01:45 PM By: Linton Ham MD Entered By: Linton Ham on 09/27/2018 16:44:20 Steven Meza ([id number]) -------------------------------------------------------------------------------- SuperBill Details Patient Name: Steven Meza Date of Service: 09/27/2018 Medical Record Number: [id number] Patient Account Number: [id number] Date of Birth/Sex: 1930/10/07 (85 y.o. 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

## 2018-10-04 NOTE — Progress Notes (Signed)
Steven Meza, Steven Meza ([id number]) Visit Report for 10/04/2018 Arrival Information Details Patient Name: Steven Meza, Steven Meza Date of Service: 10/04/2018 3:45 PM Medical Record Number: [id number] Patient Account Number: [id number] Date of Birth/Sex: 1930-07-26 (85 y.o. M) Treating RN: Cornell Barman Primary Care Crystalann Korf: Fulton Reek Other Clinician: Referring Ervin Hensley: Fulton Reek Treating Selmer Adduci/Extender: Tito Dine in Treatment: 62 Visit Information History Since Last Visit Added or deleted any medications: No Patient Arrived: Steven Meza Any new allergies or adverse reactions: No Arrival Time: 15:54 Had a fall or experienced change in No Accompanied By: self activities of daily living that may affect Transfer Assistance: None risk of falls: Patient Identification Verified: Yes Signs or symptoms of abuse/neglect since last visito No Secondary Verification Process Completed: Yes Hospitalized since last visit: No Pain Present Now: No Electronic Signature(s) Signed: 10/04/2018 4:50:33 PM By: Gretta Cool, BSN, RN, CWS, Kim RN, BSN Entered By: Gretta Cool, BSN, RN, CWS, Kim on 10/04/2018 15:55:08 Steven Meza ([id number]) -------------------------------------------------------------------------------- Clinic Level of Care Assessment Details Patient Name: Steven Meza Date of Service: 10/04/2018 3:45 PM Medical Record Number: [id number] Patient Account Number: [id number] Date of Birth/Sex: August 13, 1930 (85 y.o. M) Treating RN: Cornell Barman Primary Care Kahlee Metivier: Fulton Reek Other Clinician: Referring Salayah Meares: Fulton Reek Treating Zoriah Pulice/Extender: Tito Dine in Treatment: 20 Clinic Level of Care Assessment Items TOOL 4 Quantity Score []  - Use when only an EandM is performed on FOLLOW-UP visit 0 ASSESSMENTS - Nursing Assessment / Reassessment []  - Reassessment of Co-morbidities (includes updates in patient status) 0 X- 1 5 Reassessment of Adherence to Treatment  Plan ASSESSMENTS - Wound and Skin Assessment / Reassessment X - Simple Wound Assessment / Reassessment - one wound 1 5 []  - 0 Complex Wound Assessment / Reassessment - multiple wounds []  - 0 Dermatologic / Skin Assessment (not related to wound area) ASSESSMENTS - Focused Assessment []  - Circumferential Edema Measurements - multi extremities 0 []  - 0 Nutritional Assessment / Counseling / Intervention []  - 0 Lower Extremity Assessment (monofilament, tuning fork, pulses) []  - 0 Peripheral Arterial Disease Assessment (using hand held doppler) ASSESSMENTS - Ostomy and/or Continence Assessment and Care []  - Incontinence Assessment and Management 0 []  - 0 Ostomy Care Assessment and Management (repouching, etc.) PROCESS - Coordination of Care X - Simple Patient / Family Education for ongoing care 1 15 []  - 0 Complex (extensive) Patient / Family Education for ongoing care []  - 0 Staff obtains Programmer, systems, Records, Test Results / Process Orders []  - 0 Staff telephones HHA, Nursing Homes / Clarify orders / etc []  - 0 Routine Transfer to another Facility (non-emergent condition) []  - 0 Routine Hospital Admission (non-emergent condition) []  - 0 New Admissions / Biomedical engineer / Ordering NPWT, Apligraf, etc. []  - 0 Emergency Hospital Admission (emergent condition) []  - 0 Simple Discharge Coordination Steven Meza, Steven Meza ([id number]) X- 1 15 Complex (extensive) Discharge Coordination PROCESS - Special Needs []  - Pediatric / Minor Patient Management 0 []  - 0 Isolation Patient Management []  - 0 Hearing / Language / Visual special needs []  - 0 Assessment of Community assistance (transportation, D/C planning, etc.) []  - 0 Additional assistance / Altered mentation []  - 0 Support Surface(s) Assessment (bed, cushion, seat, etc.) INTERVENTIONS - Wound Cleansing / Measurement X - Simple Wound Cleansing - one wound 1 5 []  - 0 Complex Wound Cleansing - multiple wounds X- 1 5 Wound  Imaging (photographs - any number of wounds) []  - 0 Wound Tracing (instead of photographs) X- 1 5 Simple Wound  Measurement - one wound []  - 0 Complex Wound Measurement - multiple wounds INTERVENTIONS - Wound Dressings []  - Small Wound Dressing one or multiple wounds 0 []  - 0 Medium Wound Dressing one or multiple wounds []  - 0 Large Wound Dressing one or multiple wounds []  - 0 Application of Medications - topical []  - 0 Application of Medications - injection INTERVENTIONS - Miscellaneous []  - External ear exam 0 []  - 0 Specimen Collection (cultures, biopsies, blood, body fluids, etc.) []  - 0 Specimen(s) / Culture(s) sent or taken to Lab for analysis []  - 0 Patient Transfer (multiple staff / Civil Service fast streamer / Similar devices) []  - 0 Simple Staple / Suture removal (25 or less) []  - 0 Complex Staple / Suture removal (26 or more) []  - 0 Hypo / Hyperglycemic Management (close monitor of Blood Glucose) []  - 0 Ankle / Brachial Index (ABI) - do not check if billed separately X- 1 5 Vital Signs Steven Meza, Steven Meza ([id number]) Has the patient been seen at the hospital within the last three years: Yes Total Score: 60 Level Of Care: New/Established - Level 2 Electronic Signature(s) Signed: 10/04/2018 4:50:33 PM By: Gretta Cool, BSN, RN, CWS, Kim RN, BSN Entered By: Gretta Cool, BSN, RN, CWS, Kim on 10/04/2018 16:50:01 Steven Meza ([id number]) -------------------------------------------------------------------------------- Lower Extremity Assessment Details Patient Name: Steven Meza Date of Service: 10/04/2018 3:45 PM Medical Record Number: [id number] Patient Account Number: [id number] Date of Birth/Sex: [birth year] (85 y.o. M) Treating RN: Cornell Barman Primary Care Haydin Calandra: Fulton Reek Other Clinician: Referring Maxxon Schwanke: Fulton Reek Treating Adolfo Granieri/Extender: Tito Dine in Treatment: 20 Edema Assessment Assessed: [Left: No] [Right: No] Edema: [Left: No] [Right:  No] Vascular Assessment Pulses: Dorsalis Pedis Palpable: [Left:Yes] [Right:Yes] Electronic Signature(s) Signed: 10/04/2018 4:50:33 PM By: Gretta Cool, BSN, RN, CWS, Kim RN, BSN Entered By: Gretta Cool, BSN, RN, CWS, Kim on 10/04/2018 16:04:48 Steven Meza ([id number]) -------------------------------------------------------------------------------- Multi Wound Chart Details Patient Name: Steven Meza Date of Service: 10/04/2018 3:45 PM Medical Record Number: [id number] Patient Account Number: [id number] Date of Birth/Sex: 13-Mar-1931 (85 y.o. M) Treating RN: Cornell Barman Primary Care Mohamedamin Nifong: Fulton Reek Other Clinician: Referring Zaynab Chipman: Fulton Reek Treating Rehema Muffley/Extender: Tito Dine in Treatment: 20 Vital Signs Height(in): 71 Pulse(bpm): 90 Weight(lbs): 252 Blood Pressure(mmHg): 145/65 Body Mass Index(BMI): 35 Temperature(F): 98.6 Respiratory Rate 16 (breaths/min): Photos: [1:No Photos] [N/A:N/A] Wound Location: [1:Left, Lateral Lower Leg] [N/A:N/A] Wounding Event: [1:Gradually Appeared] [N/A:N/A] Primary Etiology: [1:Lymphedema] [N/A:N/A] Date Acquired: [1:11/14/2017] [N/A:N/A] Weeks of Treatment: [1:20] [N/A:N/A] Wound Status: [1:Healed - Epithelialized] [N/A:N/A] Clustered Wound: [1:Yes] [N/A:N/A] Measurements L x W x D [1:0x0x0] [N/A:N/A] (cm) Area (cm) : [1:0] [N/A:N/A] Volume (cm) : [1:0] [N/A:N/A] % Reduction in Area: [1:100.00%] [N/A:N/A] % Reduction in Volume: [1:100.00%] [N/A:N/A] Classification: [1:Full Thickness Without Exposed Support Structures] [N/A:N/A] Treatment Notes Electronic Signature(s) Signed: 10/04/2018 4:50:33 PM By: Gretta Cool, BSN, RN, CWS, Kim RN, BSN Entered By: Gretta Cool, BSN, RN, CWS, Kim on 10/04/2018 16:07:42 Steven Meza ([id number]) -------------------------------------------------------------------------------- Silver Steven Meza Details Patient Name: Steven Meza Date of Service: 10/04/2018 3:45  PM Medical Record Number: [id number] Patient Account Number: [id number] Date of Birth/Sex: [birth year] (85 y.o. M) Treating RN: Cornell Barman Primary Care Ross Hefferan: Fulton Reek Other Clinician: Referring Bao Bazen: Fulton Reek Treating Dryden Tapley/Extender: Tito Dine in Treatment: 20 Active Inactive Electronic Signature(s) Signed: 10/04/2018 4:50:33 PM By: Gretta Cool, BSN, RN, CWS, Kim RN, BSN Entered By: Gretta Cool, BSN, RN, CWS, Kim on 10/04/2018 16:07:19 Steven Meza ([id number]) -------------------------------------------------------------------------------- Pain Assessment Details Patient Name: Steven Meza,  Steven Meza Date of Service: 10/04/2018 3:45 PM Medical Record Number: [id number] Patient Account Number: [id number] Date of Birth/Sex: 1930-12-21 (85 y.o. M) Treating RN: Cornell Barman Primary Care Jermiah Howton: Fulton Reek Other Clinician: Referring Yared Susan: Fulton Reek Treating Candy Leverett/Extender: Tito Dine in Treatment: 20 Active Problems Location of Pain Severity and Description of Pain Patient Has Paino No Site Locations Pain Management and Medication Current Pain Management: Electronic Signature(s) Signed: 10/04/2018 4:50:33 PM By: Gretta Cool, BSN, RN, CWS, Kim RN, BSN Entered By: Gretta Cool, BSN, RN, CWS, Kim on 10/04/2018 15:55:13 Steven Meza ([id number]) -------------------------------------------------------------------------------- Wound Assessment Details Patient Name: Steven Meza Date of Service: 10/04/2018 3:45 PM Medical Record Number: [id number] Patient Account Number: [id number] Date of Birth/Sex: 28-Feb-1931 (85 y.o. M) Treating RN: Cornell Barman Primary Care Javarius Tsosie: Fulton Reek Other Clinician: Referring Langley Flatley: Fulton Reek Treating Yan Okray/Extender: Tito Dine in Treatment: 20 Wound Status Wound Number: 1 Primary Etiology: Lymphedema Wound Location: Left, Lateral Lower Leg Wound Status: Healed -  Epithelialized Wounding Event: Gradually Appeared Date Acquired: 11/14/2017 Weeks Of Treatment: 20 Clustered Wound: Yes Wound Measurements Length: (cm) 0 Width: (cm) 0 Depth: (cm) 0 Area: (cm) 0 Volume: (cm) 0 % Reduction in Area: 100% % Reduction in Volume: 100% Wound Description Full Thickness Without Exposed Support Classification: Structures Electronic Signature(s) Signed: 10/04/2018 4:50:33 PM By: Gretta Cool, BSN, RN, CWS, Kim RN, BSN Entered By: Gretta Cool, BSN, RN, CWS, Kim on 10/04/2018 16:04:29 Steven Meza ([id number]) -------------------------------------------------------------------------------- Vitals Details Patient Name: Steven Meza Date of Service: 10/04/2018 3:45 PM Medical Record Number: [id number] Patient Account Number: [id number] Date of Birth/Sex: 1930/12/16 (85 y.o. M) Treating RN: Cornell Barman Primary Care Salisa Broz: Fulton Reek Other Clinician: Referring Maxie Slovacek: Fulton Reek Treating Kalaysia Demonbreun/Extender: Tito Dine in Treatment: 20 Vital Signs Time Taken: 15:55 Temperature (F): 98.6 Height (in): 71 Pulse (bpm): 90 Weight (lbs): 252 Respiratory Rate (breaths/min): 16 Body Mass Index (BMI): 35.1 Blood Pressure (mmHg): 145/65 Reference Range: 80 - 120 mg / dl Electronic Signature(s) Signed: 10/04/2018 4:50:33 PM By: Gretta Cool, BSN, RN, CWS, Kim RN, BSN Entered By: Gretta Cool, BSN, RN, CWS, Kim on 10/04/2018 15:55:31

## 2018-10-04 NOTE — Progress Notes (Signed)
Steven Meza, Steven Meza ([id number]) Visit Report for 10/04/2018 HPI Details Patient Name: Steven Meza, Steven Meza Date of Service: 10/04/2018 3:45 PM Medical Record Number: [id number] Patient Account Number: [id number] Date of Birth/Sex: 06-27-1930 (85 y.o. M) Treating RN: Cornell Barman Primary Care Provider: Fulton Reek Other Clinician: Referring Provider: Fulton Reek Treating Provider/Extender: Tito Dine in Treatment: 20 History of Present Illness HPI Description: ADMISSION 05/17/2018 Mr. Davtyan is an 85 year old man who is been treated at the lymphedema clinic for a long period with lymphedema wraps for bilateral lower extremity lymphedema. About 6 months ago they noted small open areas on his left lateral calf just above the ankle. These were open. They are apparently putting some form of ointment on this. They have been referred here for our review of this. The patient has a long history of lymphedema and venous stasis. It says in his records that he has recurrent cellulitis although his wife denies this but she states that the lymphedema may have started with cellulitis several years ago. Also in his record there is a history of bladder cancer which they seem to know very little about however he did not have any radiation to the pelvis or anything that could have contributed to lymphedema that they are aware of. There is no prior wound history. The patient has 2 small punched out areas on the left lateral calf that have adherent debris on the surface. Past medical history; lymphedema, hypertension, cellulitis, hearing loss, morbid obesity, osteoarthritis, venous stasis, bladder CA, diverticulosis. ABIs in our clinic were 0.36 on the right and 0.57 on the left 05/24/2018; corrected age on this patient is 85 versus what I said last week. He has been for his arterial studies which predictably are not very good. On the right his ABI is 0.65. Monophasic waveforms noted at the right ankle  including the posterior tibial and dorsalis pedis. He has triphasic waveforms of the common femoral and profunda femoris triphasic proximal SFA with monophasic distal FSA and popliteal artery. Monophasic tibial waveforms. On the left his ABI is 0.58 monophasic waveforms at the left ankle. Duplex of the left lower extremity demonstrated atherosclerotic change with monophasic waveforms throughout the left lower extremity. There was some concern about left iliac disease and potentially femoral-popliteal and tibial disease. The patient does not describe claudication although according to his wife he over emphasizes his activity. Patient states he is limited by pain in both knees His wounds are on the left lateral calf. 2 small areas with necrotic debris. We have been using silver collagen and light Ace wraps 05/31/2018; wounds on the left lateral calf in the setting of very significant lymphedema and probably PAD. We have been using silver collagen. The base of the small wound looks reasonably improved. I sent him to see Dr. Fletcher Anon however I see now he has an appointment with Dr. Alvester Chou on February 12 2/5; left lateral calf wound looks the same. His wife is doing a good job with her lymphedema wraps and maintaining that the edema. He most likely has PAD and is due to see Dr. Gwenlyn Found of infectious disease on February 12 2/19; left lateral calf wounds look about the same. This looks like wound secondary to chronic venous insufficiency with lymphedema however the patient has very poor arterial studies. We referred him to Dr. Gwenlyn Found. Dr. Gwenlyn Found did not feel he needed to do anything from an arterial point of view. He did not address the question about the aggressiveness of compression. After some thoughts about  this I elected to go ahead and put him in 3 layer compression which after all would be less compression then the lymphedema clinic was putting on this. I am hopeful that this should be enough to get  some closure of the small wounds 2/26; left lateral calf wound letter this week. He tolerated the 3 layer compression well furthermore he is sleeping in a hospital bed which helps keep his legs up at night. The wound looks better measuring smaller especially in width 3/3; left lateral calf wound about the same size. The 3 layer compression has really helped get the edema down in his leg Steven Meza, Steven A. ([id number]) however. Using silver collagen 3/11; left lateral calf wound better looking wound surface but about the same size. We have been using 3 layer compression which is really helped get the edema down in the left leg. Even after Dr. Kennon Holter assessment I am reluctant to go to 4 layer compression. He is tolerating 3 layer compression well. We have been using silver collagen 3/18-Patient returns for his left lateral calf wound which overall is looking better, slightly increased in size and dimensions and area, he is tolerating the 3 layer compression, has been dressed with silver collagen which we will continue 3/25oleft lateral calf wound much better looking. Size is smaller. He has been using silver collagen 4/1; gradually getting smaller in size. Using silver collagen with 3 layer compression. I think the patient has some degree of PAD. He saw Dr. Gwenlyn Found in consultation, he did not specifically comment on whether he could tolerate 4 layer compression 4/8; the wound is slightly smaller in depth. We are using 3 layer compression with silver collagen. I had him seen for arterial insufficiency however his ABI was 0.57 in the left leg. Dr. Gwenlyn Found really did not seem to middle on the degree of compression he could tolerate 4/15; the wound is slightly smaller and slightly less deep. We have been using 3 layer compression with silver collagen. There is not an option for 4-layer compression here. 4/22; the patient is making decent progress on his wound on the left lateral calf however he arrives in  with a superficial wound on the right anterior tibial area. Were not really sure how this happened. He had been using a stocking on the right leg. He has been using silver collagen on the wounds Amazing to me he is he appears to have compression pumps at home. I am not really sure I knew this before. In any case he has not been using them 4/29; left lateral calf wound continues to get gradually smaller. This looks like it is on its way to closure. oThe area on the right anterior tibial area is about the same in terms of size superficial without any depth. This was probably wrap injury oHe tells me that he has been using his compression pumps once a day for 1 hour without any pain 5/6; not as good today. Left lateral calf wound is actually larger and he does not have quite as good edema control. The area on the right anterior tibia is about the same still superficial. He states he is using his compression pumps once a day. We are using 3 layer compression on the left and Curlex Coban on the right 5/13; the area on the right is very close to being healed. The area on the left still has some depth but has a healthy wound surface. He has excellent edema control bilaterally. Using Hydrofera Blue 5/20-Patient returns  in 1 week after being in 3 layer compression on the left, the left lateral calf ulcer is stable with some slough, the right anterior tibial ulcer is healed we have been using Kerlix Coban on that leg 5/27; Hydrofera Blue and 3 layer compression. Still some slough in the wound area and some nonviable edges around the wound. 6/3; this patient has lymphedema and chronic venous insufficiency. He had difficult to close wound on his left lateral calf. This is closed today. 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 Finally he has known PAD but he is managed to tolerate the compression we reporting on him and also using his compression pumps that he already  had at home twice a day Electronic Signature(s) Signed: 10/04/2018 4:12:25 PM By: Linton Ham MD Entered By: Linton Ham on 10/04/2018 16:08:30 Steven Meza ([id number]) -------------------------------------------------------------------------------- Physical Exam Details Patient Name: Steven Meza Date of Service: 10/04/2018 3:45 PM Medical Record Number: [id number] Patient Account Number: [id number] Date of Birth/Sex: 04-May-1930 (85 y.o. M) Treating RN: Cornell Barman Primary Care Provider: Fulton Reek Other Clinician: Referring Provider: Fulton Reek Treating Provider/Extender: Tito Dine in Treatment: 71 Constitutional Patient is hypertensive.. Pulse regular and within target range for patient.Marland Kitchen Respirations regular, non-labored and within target range.. Temperature is normal and within the target range for the patient.Marland Kitchen appears in no distress. Notes Wound exam; the small left lateral calf wound is closed over. He has flaking dry skin on this leg as well as stasis dermatitis. His lymphedema is largely controlled which is the major factor I think contributing to his wound healing. I cannot feel his peripheral pulses in the left foot however he is tolerated the compression we have put on him as well as his compression pumps Electronic Signature(s) Signed: 10/04/2018 4:12:25 PM By: Linton Ham MD Entered By: Linton Ham on 10/04/2018 16:09:35 Steven Meza ([id number]) -------------------------------------------------------------------------------- Physician Orders Details Patient Name: Steven Meza Date of Service: 10/04/2018 3:45 PM Medical Record Number: [id number] Patient Account Number: [id number] Date of Birth/Sex: 1930/07/11 (85 y.o. M) Treating RN: Cornell Barman Primary Care Provider: Fulton Reek Other Clinician: Referring Provider: Fulton Reek Treating Provider/Extender: Tito Dine in Treatment: 20 Verbal / Phone  Orders: No Diagnosis Coding ICD-10 Coding Meza Description [phone] Non-pressure chronic ulcer of left calf with other specified severity I89.0 Lymphedema, not elsewhere classified I70.212 Atherosclerosis of native arteries of extremities with intermittent claudication, left leg L97.211 Non-pressure chronic ulcer of right calf limited to breakdown of skin Skin Barriers/Peri-Wound Care o Moisturizing lotion - daily to lower legs Edema Control o Patient to wear own compression stockings o Compression Pump: Use compression pump on left lower extremity for 60 minutes, twice daily. o Compression Pump: Use compression pump on right lower extremity for 60 minutes, twice daily. Discharge From [facility] o Discharge from [facility] - treatment complete Electronic Signature(s) Signed: 10/04/2018 4:12:25 PM By: Linton Ham MD Signed: 10/04/2018 4:50:33 PM By: Gretta Cool, BSN, RN, CWS, Kim RN, BSN Entered By: Gretta Cool, BSN, RN, CWS, Kim on 10/04/2018 16:09:20 Steven Meza ([id number]) -------------------------------------------------------------------------------- Problem List Details Patient Name: Steven Meza Date of Service: 10/04/2018 3:45 PM Medical Record Number: [id number] Patient Account Number: [id number] Date of Birth/Sex: 09-13-1930 (85 y.o. M) Treating RN: Cornell Barman Primary Care Provider: Fulton Reek Other Clinician: Referring Provider: Fulton Reek Treating Provider/Extender: Tito Dine in Treatment: 20 Active Problems ICD-10 Evaluated Encounter Meza Description Active Date Today Diagnosis L97.228 Non-pressure  chronic ulcer of left calf with other specified 05/17/2018 No Yes severity I89.0 Lymphedema, not elsewhere classified 05/17/2018 No Yes I70.212 Atherosclerosis of native arteries of extremities with 05/17/2018 No Yes intermittent claudication, left leg L97.211 Non-pressure chronic ulcer of right calf limited to breakdown 08/23/2018 No  Yes of skin Inactive Problems Resolved Problems Electronic Signature(s) Signed: 10/04/2018 4:12:25 PM By: Linton Ham MD Entered By: Linton Ham on 10/04/2018 16:07:01 Steven Meza ([id number]) -------------------------------------------------------------------------------- Progress Note Details Patient Name: Steven Meza Date of Service: 10/04/2018 3:45 PM Medical Record Number: [id number] Patient Account Number: [id number] Date of Birth/Sex: [birth year] (85 y.o. M) Treating RN: Cornell Barman Primary Care Provider: Fulton Reek Other Clinician: Referring Provider: Fulton Reek Treating Provider/Extender: Tito Dine in Treatment: 20 Subjective History of Present Illness (HPI) ADMISSION 05/17/2018 Mr. Middleton is an 85 year old man who is been treated at the lymphedema clinic for a long period with lymphedema wraps for bilateral lower extremity lymphedema. About 6 months ago they noted small open areas on his left lateral calf just above the ankle. These were open. They are apparently putting some form of ointment on this. They have been referred here for our review of this. The patient has a long history of lymphedema and venous stasis. It says in his records that he has recurrent cellulitis although his wife denies this but she states that the lymphedema may have started with cellulitis several years ago. Also in his record there is a history of bladder cancer which they seem to know very little about however he did not have any radiation to the pelvis or anything that could have contributed to lymphedema that they are aware of. There is no prior wound history. The patient has 2 small punched out areas on the left lateral calf that have adherent debris on the surface. Past medical history; lymphedema, hypertension, cellulitis, hearing loss, morbid obesity, osteoarthritis, venous stasis, bladder CA, diverticulosis. ABIs in our clinic were 0.36 on the right and  0.57 on the left 05/24/2018; corrected age on this patient is 14 versus what I said last week. He has been for his arterial studies which predictably are not very good. On the right his ABI is 0.65. Monophasic waveforms noted at the right ankle including the posterior tibial and dorsalis pedis. He has triphasic waveforms of the common femoral and profunda femoris triphasic proximal SFA with monophasic distal FSA and popliteal artery. Monophasic tibial waveforms. On the left his ABI is 0.58 monophasic waveforms at the left ankle. Duplex of the left lower extremity demonstrated atherosclerotic change with monophasic waveforms throughout the left lower extremity. There was some concern about left iliac disease and potentially femoral-popliteal and tibial disease. The patient does not describe claudication although according to his wife he over emphasizes his activity. Patient states he is limited by pain in both knees His wounds are on the left lateral calf. 2 small areas with necrotic debris. We have been using silver collagen and light Ace wraps 05/31/2018; wounds on the left lateral calf in the setting of very significant lymphedema and probably PAD. We have been using silver collagen. The base of the small wound looks reasonably improved. I sent him to see Dr. Fletcher Anon however I see now he has an appointment with Dr. Alvester Chou on February 12 2/5; left lateral calf wound looks the same. His wife is doing a good job with her lymphedema wraps and maintaining that the edema. He most likely has PAD and is due to see Dr. Gwenlyn Found of  infectious disease on February 12 2/19; left lateral calf wounds look about the same. This looks like wound secondary to chronic venous insufficiency with lymphedema however the patient has very poor arterial studies. We referred him to Dr. Gwenlyn Found. Dr. Gwenlyn Found did not feel he needed to do anything from an arterial point of view. He did not address the question about the aggressiveness  of compression. After some thoughts about this I elected to go ahead and put him in 3 layer compression which after all would be less compression then the lymphedema clinic was putting on this. I am hopeful that this should be enough to get some closure of the small wounds 2/26; left lateral calf wound letter this week. He tolerated the 3 layer compression well furthermore he is sleeping in a hospital bed which helps keep his legs up at night. The wound looks better measuring smaller especially in width 3/3; left lateral calf wound about the same size. The 3 layer compression has really helped get the edema down in his leg however. Using silver collagen 3/11; left lateral calf wound better looking wound surface but about the same size. We have been using 3 layer compression which is really helped get the edema down in the left leg. Even after Dr. Kennon Holter assessment I am reluctant to go to 4 layer Steven Meza, Steven A. ([id number]) compression. He is tolerating 3 layer compression well. We have been using silver collagen 3/18-Patient returns for his left lateral calf wound which overall is looking better, slightly increased in size and dimensions and area, he is tolerating the 3 layer compression, has been dressed with silver collagen which we will continue 3/25 left lateral calf wound much better looking. Size is smaller. He has been using silver collagen 4/1; gradually getting smaller in size. Using silver collagen with 3 layer compression. I think the patient has some degree of PAD. He saw Dr. Gwenlyn Found in consultation, he did not specifically comment on whether he could tolerate 4 layer compression 4/8; the wound is slightly smaller in depth. We are using 3 layer compression with silver collagen. I had him seen for arterial insufficiency however his ABI was 0.57 in the left leg. Dr. Gwenlyn Found really did not seem to middle on the degree of compression he could tolerate 4/15; the wound is slightly smaller  and slightly less deep. We have been using 3 layer compression with silver collagen. There is not an option for 4-layer compression here. 4/22; the patient is making decent progress on his wound on the left lateral calf however he arrives in with a superficial wound on the right anterior tibial area. Were not really sure how this happened. He had been using a stocking on the right leg. He has been using silver collagen on the wounds Amazing to me he is he appears to have compression pumps at home. I am not really sure I knew this before. In any case he has not been using them 4/29; left lateral calf wound continues to get gradually smaller. This looks like it is on its way to closure. The area on the right anterior tibial area is about the same in terms of size superficial without any depth. This was probably wrap injury He tells me that he has been using his compression pumps once a day for 1 hour without any pain 5/6; not as good today. Left lateral calf wound is actually larger and he does not have quite as good edema control. The area on the right anterior  tibia is about the same still superficial. He states he is using his compression pumps once a day. We are using 3 layer compression on the left and Curlex Coban on the right 5/13; the area on the right is very close to being healed. The area on the left still has some depth but has a healthy wound surface. He has excellent edema control bilaterally. Using Hydrofera Blue 5/20-Patient returns in 1 week after being in 3 layer compression on the left, the left lateral calf ulcer is stable with some slough, the right anterior tibial ulcer is healed we have been using Kerlix Coban on that leg 5/27; Hydrofera Blue and 3 layer compression. Still some slough in the wound area and some nonviable edges around the wound. 6/3; this patient has lymphedema and chronic venous insufficiency. He had difficult to close wound on his left lateral calf. This is  closed today. 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 Finally he has known PAD but he is managed to tolerate the compression we reporting on him and also using his compression pumps that he already had at home twice a day Objective Constitutional Patient is hypertensive.. Pulse regular and within target range for patient.Marland Kitchen Respirations regular, non-labored and within target range.. Temperature is normal and within the target range for the patient.Marland Kitchen appears in no distress. Vitals Time Taken: 3:55 PM, Height: 71 in, Weight: 252 lbs, BMI: 35.1, Temperature: 98.6 F, Pulse: 90 bpm, Respiratory Rate: 16 breaths/min, Blood Pressure: 145/65 mmHg. General Notes: Wound exam; the small left lateral calf wound is closed over. He has flaking dry skin on this leg as well as stasis dermatitis. His lymphedema is largely controlled which is the major factor I think contributing to his wound healing. I cannot feel his peripheral pulses in the left foot however he is tolerated the compression we have put on him as well as his compression pumps Steven Meza, Steven A. ([id number]) Integumentary (Hair, Skin) Wound #1 status is Healed - Epithelialized. Original cause of wound was Gradually Appeared. The wound is located on the Left,Lateral Lower Leg. The wound measures 0cm length x 0cm width x 0cm depth; 0cm^2 area and 0cm^3 volume. 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 Compression Pump: Use  compression pump on right lower extremity for 60 minutes, twice daily. Discharge From [name]: Discharge from [facility] - treatment complete 1. The patient can be discharged from the clinic 2. Moisturizing lotion daily to his lower legs 3. He has a compression stocking 4. He is to use his compression pumps twice daily. He has been compliant with this. Electronic Signature(s) Signed: 10/04/2018 4:12:25 PM By: Linton Ham MD Entered By: Linton Ham on 10/04/2018 16:10:19 Steven Meza ([id number]) -------------------------------------------------------------------------------- SuperBill Details Patient Name: Steven Meza Date of Service: 10/04/2018 Medical Record Number: [id number] Patient Account Number: [id number] Date of Birth/Sex: 05-17-1930 (85 y.o. M) Treating RN: Cornell Barman Primary Care Provider: Fulton Reek Other Clinician: Referring Provider: Fulton Reek Treating Provider/Extender: Tito Dine in Treatment: 20 Diagnosis Coding ICD-10 Codes Meza Description [phone] Non-pressure chronic ulcer of  left calf with other specified severity I89.0 Lymphedema, not elsewhere classified I70.212 Atherosclerosis of native arteries of extremities with intermittent claudication, left leg L97.211 Non-pressure chronic ulcer of right calf limited to breakdown of skin Facility Procedures CPT4 Meza: [id number] Description: [phone] - WOUND CARE VISIT-LEV 2 EST PT Modifier: Quantity: 1 Physician Procedures CPT4 Meza: [id number] Description: [id number] - WC PHYS LEVEL 2 - EST PT ICD-10 Diagnosis Description L97.228 Non-pressure chronic ulcer of left calf with other specified se I89.0 Lymphedema, not elsewhere classified Modifier: verity Quantity: 1 Electronic Signature(s) Signed: 10/04/2018 4:50:11 PM By: Gretta Cool, BSN, RN, CWS, Kim RN, BSN Previous Signature: 10/04/2018 4:12:25 PM Version By: Linton Ham MD Entered By: Gretta Cool, BSN, RN, CWS, Kim on 10/04/2018  16:50:11

## 2018-10-11 ENCOUNTER — Other Ambulatory Visit: Payer: Self-pay

## 2018-10-11 ENCOUNTER — Encounter: Payer: Medicare Other | Admitting: Internal Medicine

## 2018-10-11 DIAGNOSIS — L97228 Non-pressure chronic ulcer of left calf with other specified severity: Secondary | ICD-10-CM | POA: Diagnosis not present

## 2018-10-11 NOTE — Progress Notes (Signed)
Steven Meza ([id number]) Visit Report for 10/11/2018 Debridement Details Patient Name: Steven Meza, Steven Meza Date of Service: 10/11/2018 10:45 AM Medical Record Number: [id number] Patient Account Number: [id number] Date of Birth/Sex: 08/02/1930 (85 y.o. M) Treating RN: Harold Barban Primary Care Provider: Fulton Reek Other Clinician: Referring Provider: Fulton Reek Treating Provider/Extender: Tito Dine in Treatment: 21 Debridement Performed for Wound #1 Left,Lateral Lower Leg Assessment: Performed By: Physician Ricard Dillon, MD Debridement Type: Debridement Level of Consciousness (Pre- Awake and Alert procedure): Pre-procedure Verification/Time Yes - 11:22 Out Taken: Start Time: 11:22 Pain Control: Lidocaine Total Area Debrided (L x W): 1.5 (cm) x 2.3 (cm) = 3.45 (cm) Tissue and other material Non-Viable, Subcutaneous debrided: Level: Skin/Subcutaneous Tissue Debridement Description: Excisional Instrument: Curette Bleeding: Minimum Hemostasis Achieved: Pressure End Time: 11:25 Procedural Pain: 0 Post Procedural Pain: 0 Response to Treatment: Procedure was tolerated well Level of Consciousness Awake and Alert (Post-procedure): Post Debridement Measurements of Total Wound Length: (cm) 1.5 Width: (cm) 2.3 Depth: (cm) 0.1 Volume: (cm) 0.271 Character of Wound/Ulcer Post Debridement: Improved Post Procedure Diagnosis Same as Pre-procedure Electronic Signature(s) Signed: 10/11/2018 4:01:30 PM By: Linton Ham MD Signed: 10/11/2018 4:18:59 PM By: Harold Barban Entered By: Linton Ham on 10/11/2018 12:16:00 Steven Meza ([id number]) -------------------------------------------------------------------------------- HPI Details Patient Name: Steven Meza Date of Service: 10/11/2018 10:45 AM Medical Record Number: [id number] Patient Account Number: [id number] Date of Birth/Sex: Apr 15, 1931 (85 y.o. M) Treating RN: Harold Barban Primary Care Provider: Fulton Reek Other Clinician: Referring Provider: Fulton Reek Treating Provider/Extender: Tito Dine in Treatment: 21 History of Present Illness HPI Description: ADMISSION 05/17/2018 Steven Meza is an 85 year old man who is been treated at the lymphedema clinic for a long period with lymphedema wraps for bilateral lower extremity lymphedema. About 6 months ago they noted small open areas on his left lateral calf just above the ankle. These were open. They are apparently putting some form of ointment on this. They have been referred here for our review of this. The patient has a long history of lymphedema and venous stasis. It says in his records that he has recurrent cellulitis although his wife denies this but she states that the lymphedema may have started with cellulitis several years ago. Also in his record there is a history of bladder cancer which they seem to know very little about however he did not have any radiation to the pelvis or anything that could have contributed to lymphedema that they are aware of. There is no prior wound history. The patient has 2 small punched out areas on the left lateral calf that have adherent debris on the surface. Past medical history; lymphedema, hypertension, cellulitis, hearing loss, morbid obesity, osteoarthritis, venous stasis, bladder CA, diverticulosis. ABIs in our clinic were 0.36 on the right and 0.57 on the left 05/24/2018; corrected age on this patient is 8 versus what I said last week. He has been for his arterial studies which predictably are not very good. On the right his ABI is 0.65. Monophasic waveforms noted at the right ankle including the posterior tibial and dorsalis pedis. He has triphasic waveforms of the common femoral and profunda femoris triphasic proximal SFA with monophasic distal FSA and popliteal artery. Monophasic tibial waveforms. On the left his ABI is 0.58 monophasic  waveforms at the left ankle. Duplex of the left lower extremity demonstrated atherosclerotic change with monophasic waveforms throughout the left lower extremity. There was some concern about left iliac disease and potentially femoral-popliteal and tibial disease.  The patient does not describe claudication although according to his wife he over emphasizes his activity. Patient states he is limited by pain in both knees His wounds are on the left lateral calf. 2 small areas with necrotic debris. We have been using silver collagen and light Ace wraps 05/31/2018; wounds on the left lateral calf in the setting of very significant lymphedema and probably PAD. We have been using silver collagen. The base of the small wound looks reasonably improved. I sent him to see Steven Meza however I see now he has an appointment with Steven Meza on February 12 2/5; left lateral calf wound looks the same. His wife is doing a good job with her lymphedema wraps and maintaining that the edema. He most likely has PAD and is due to see Steven Meza of infectious disease on February 12 2/19; left lateral calf wounds look about the same. This looks like wound secondary to chronic venous insufficiency with lymphedema however the patient has very poor arterial studies. We referred him to Steven Meza. Steven Meza did not feel he needed to do anything from an arterial point of view. He did not address the question about the aggressiveness of compression. After some thoughts about this I elected to go ahead and put him in 3 layer compression which after all would be less compression then the lymphedema clinic was putting on this. I am hopeful that this should be enough to get some closure of the small wounds 2/26; left lateral calf wound letter this week. He tolerated the 3 layer compression well furthermore he is sleeping in a hospital bed which helps keep his legs up at night. The wound looks better measuring smaller especially in  width 3/3; left lateral calf wound about the same size. The 3 layer compression has really helped get the edema down in his leg however. Using silver collagen 3/11; left lateral calf wound better looking wound surface but about the same size. We have been using 3 layer compression which is really helped get the edema down in the left leg. Even after Dr. Kennon Holter assessment I am reluctant to go to 4 layer compression. He is tolerating 3 layer compression well. We have been using silver collagen Steven Meza, Steven Meza ([id number]) 3/18-Patient returns for his left lateral calf wound which overall is looking better, slightly increased in size and dimensions and area, he is tolerating the 3 layer compression, has been dressed with silver collagen which we will continue 3/25oleft lateral calf wound much better looking. Size is smaller. He has been using silver collagen 4/1; gradually getting smaller in size. Using silver collagen with 3 layer compression. I think the patient has some degree of PAD. He saw Steven Meza in consultation, he did not specifically comment on whether he could tolerate 4 layer compression 4/8; the wound is slightly smaller in depth. We are using 3 layer compression with silver collagen. I had him seen for arterial insufficiency however his ABI was 0.57 in the left leg. Steven Meza really did not seem to middle on the degree of compression he could tolerate 4/15; the wound is slightly smaller and slightly less deep. We have been using 3 layer compression with silver collagen. There is not an option for 4-layer compression here. 4/22; the patient is making decent progress on his wound on the left lateral calf however he arrives in with a superficial wound on the right anterior tibial area. Were not really sure how this happened. He had been using a  stocking on the right leg. He has been using silver collagen on the wounds Amazing to me he is he appears to have compression pumps at home. I  am not really sure I knew this before. In any case he has not been using them 4/29; left lateral calf wound continues to get gradually smaller. This looks like it is on its way to closure. oThe area on the right anterior tibial area is about the same in terms of size superficial without any depth. This was probably wrap injury oHe tells me that he has been using his compression pumps once a day for 1 hour without any pain 5/6; not as good today. Left lateral calf wound is actually larger and he does not have quite as good edema control. The area on the right anterior tibia is about the same still superficial. He states he is using his compression pumps once a day. We are using 3 layer compression on the left and Curlex Coban on the right 5/13; the area on the right is very close to being healed. The area on the left still has some depth but has a healthy wound surface. He has excellent edema control bilaterally. Using Hydrofera Blue 5/20-Patient returns in 1 week after being in 3 layer compression on the left, the left lateral calf ulcer is stable with some slough, the right anterior tibial ulcer is healed we have been using Kerlix Coban on that leg 5/27; Hydrofera Blue and 3 layer compression. Still some slough in the wound area and some nonviable edges around the wound. 6/3; this patient has lymphedema and chronic venous insufficiency. He had difficult to close wound on his left lateral calf. This is closed today. 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 Finally he has known PAD but he is managed to tolerate the compression we reporting on him and also using his compression pumps that he already had at home twice a day 6/10; we discharge this patient last week having finally close the small area in the left lateral calf. Apparently by Friday of last week this had opened and was draining. He is therefore back in clinic. His wife who is present also was  concerned about an area on the right anterior tibia Electronic Signature(s) Signed: 10/11/2018 4:01:30 PM By: Linton Ham MD Entered By: Linton Ham on 10/11/2018 12:17:06 Steven Meza ([id number]) -------------------------------------------------------------------------------- Physical Exam Details Patient Name: Steven Meza Date of Service: 10/11/2018 10:45 AM Medical Record Number: [id number] Patient Account Number: [id number] Date of Birth/Sex: 03/16/1931 (85 y.o. M) Treating RN: Harold Barban Primary Care Provider: Fulton Reek Other Clinician: Referring Provider: Fulton Reek Treating Provider/Extender: Tito Dine in Treatment: 21 Constitutional Patient is hypertensive.. Pulse regular and within target range for patient.Marland Kitchen Respirations regular, non-labored and within target range.. Temperature is normal and within the target range for the patient.Marland Kitchen appears in no distress. Eyes Conjunctivae clear. No discharge. Lymphatic None palpable in the popliteal area. Integumentary (Hair, Skin) Severe bilateral skin damage secondary to lymphedema and chronic venous insufficiency with inflammation. Psychiatric No evidence of depression, anxiety, or agitation. Calm, cooperative, and communicative. Appropriate interactions and affect.. Notes Wound exam; the small left lateral calf wound is reopened. Necrotic debris removed with a #3 curette hemostasis with direct pressure. He also has superficial areas surrounding this. His edema in the left leg does not look particularly ominous nevertheless this is probably due to inadequate compression. oHe does not have an open area that  I could see on the right leg Electronic Signature(s) Signed: 10/11/2018 4:01:30 PM By: Linton Ham MD Entered By: Linton Ham on 10/11/2018 12:18:47 Steven Meza ([id number]) -------------------------------------------------------------------------------- Physician Orders  Details Patient Name: Steven Meza Date of Service: 10/11/2018 10:45 AM Medical Record Number: [id number] Patient Account Number: [id number] Date of Birth/Sex: [birth year] (85 y.o. M) Treating RN: Harold Barban Primary Care Provider: Fulton Reek Other Clinician: Referring Provider: Fulton Reek Treating Provider/Extender: Tito Dine in Treatment: 74 Verbal / Phone Orders: No Diagnosis Coding Primary Wound Dressing Wound #1 Left,Lateral Lower Leg o Silver Alginate Wound #3 Left,Medial Lower Leg o Silver Alginate Secondary Dressing Wound #1 Left,Lateral Lower Leg o ABD pad Wound #3 Left,Medial Lower Leg o ABD pad Dressing Change Frequency Wound #1 Left,Lateral Lower Leg o Change dressing every week - In office Wound #3 Left,Medial Lower Leg o Change dressing every week - In office Follow-up Appointments Wound #1 Left,Lateral Lower Leg o Return Appointment in 1 week. Wound #3 Left,Medial Lower Leg o Return Appointment in 1 week. Wound #4 Right,Anterior Lower Leg o Return Appointment in 1 week. Edema Control Wound #1 Left,Lateral Lower Leg o 3 Layer Compression System - Left Lower Extremity Wound #3 Left,Medial Lower Leg o 3 Layer Compression System - Left Lower Extremity Wound #4 Right,Anterior Lower Leg o Other: - Patients own compression stocking 20-30 Electronic Signature(s) Steven Meza, Steven Meza ([id number]) Signed: 10/11/2018 11:49:30 AM By: Harold Barban Signed: 10/11/2018 4:01:30 PM By: Linton Ham MD Entered By: Harold Barban on 10/11/2018 11:49:28 Steven Meza ([id number]) -------------------------------------------------------------------------------- Problem List Details Patient Name: Steven Meza Date of Service: 10/11/2018 10:45 AM Medical Record Number: [id number] Patient Account Number: [id number] Date of Birth/Sex: 02/18/1931 (85 y.o. 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 with 05/17/2018 No Yes intermittent claudication, left leg L97.211 Non-pressure chronic ulcer of right calf limited to breakdown 08/23/2018 No Yes of skin Inactive Problems Resolved Problems Electronic Signature(s) Signed: 10/11/2018 4:01:30 PM By: Linton Ham MD Entered By: Linton Ham on 10/11/2018 12:15:06 Steven Meza ([id number]) -------------------------------------------------------------------------------- Progress Note Details Patient Name: Steven Meza Date of Service: 10/11/2018 10:45 AM Medical Record Number: [id number] Patient Account Number: [id number] Date of Birth/Sex: 1930/06/13 (85 y.o. M) Treating RN: Harold Barban Primary Care Provider: Fulton Reek Other Clinician: Referring Provider: Fulton Reek Treating Provider/Extender: Tito Dine in Treatment: 21 Subjective History of Present Illness (HPI) ADMISSION 05/17/2018 Mr. Ripberger is an 85 year old man who is been treated at the lymphedema clinic for a long period with lymphedema wraps for bilateral lower extremity lymphedema. About 6 months ago they noted small open areas on his left lateral calf just above the ankle. These were open. They are apparently putting some form of ointment on this. They have been referred here for our review of this. The patient has a long history of lymphedema and venous stasis. It says in his records that he has recurrent cellulitis although his wife denies this but she states that the lymphedema may have started with  cellulitis several years ago. Also in his record there is a history of bladder cancer which they seem to know very little about however he  did not have any radiation to the pelvis or anything that could have contributed to lymphedema that they are aware of. There is no prior wound history. The patient has 2 small punched out areas on the left lateral calf that have adherent debris on the surface. Past medical history; lymphedema, hypertension, cellulitis, hearing loss, morbid obesity, osteoarthritis, venous stasis, bladder CA, diverticulosis. ABIs in our clinic were 0.36 on the right and 0.57 on the left 05/24/2018; corrected age on this patient is 57 versus what I said last week. He has been for his arterial studies which predictably are not very good. On the right his ABI is 0.65. Monophasic waveforms noted at the right ankle including the posterior tibial and dorsalis pedis. He has triphasic waveforms of the common femoral and profunda femoris triphasic proximal SFA with monophasic distal FSA and popliteal artery. Monophasic tibial waveforms. On the left his ABI is 0.58 monophasic waveforms at the left ankle. Duplex of the left lower extremity demonstrated atherosclerotic change with monophasic waveforms throughout the left lower extremity. There was some concern about left iliac disease and potentially femoral-popliteal and tibial disease. The patient does not describe claudication although according to his wife he over emphasizes his activity. Patient states he is limited by pain in both knees His wounds are on the left lateral calf. 2 small areas with necrotic debris. We have been using silver collagen and light Ace wraps 05/31/2018; wounds on the left lateral calf in the setting of very significant lymphedema and probably PAD. We have been using silver collagen. The base of the small wound looks reasonably improved. I sent him to see Steven Meza however I see now he has an appointment with Dr.  Alvester Meza on February 12 2/5; left lateral calf wound looks the same. His wife is doing a good job with her lymphedema wraps and maintaining that the edema. He most likely has PAD and is due to see Steven Meza of infectious disease on February 12 2/19; left lateral calf wounds look about the same. This looks like wound secondary to chronic venous insufficiency with lymphedema however the patient has very poor arterial studies. We referred him to Steven Meza. Steven Meza did not feel he needed to do anything from an arterial point of view. He did not address the question about the aggressiveness of compression. After some thoughts about this I elected to go ahead and put him in 3 layer compression which after all would be less compression then the lymphedema clinic was putting on this. I am hopeful that this should be enough to get some closure of the small wounds 2/26; left lateral calf wound letter this week. He tolerated the 3 layer compression well furthermore he is sleeping in a hospital bed which helps keep his legs up at night. The wound looks better measuring smaller especially in width 3/3; left lateral calf wound about the same size. The 3 layer compression has really helped get the edema down in his leg however. Using silver collagen 3/11; left lateral calf wound better looking wound surface but about the same size. We have been using 3 layer compression which is really helped get the edema down in the left leg. Even after Dr. Kennon Holter assessment I am reluctant to go to 4 layer Steven Meza, Steven A. ([id number]) compression. He is tolerating 3 layer compression well. We have been using silver collagen 3/18-Patient returns for his left lateral calf wound which overall is looking better, slightly increased in size and dimensions and area,  he is tolerating the 3 layer compression, has been dressed with silver collagen which we will continue 3/25 left lateral calf wound much better looking. Size is smaller.  He has been using silver collagen 4/1; gradually getting smaller in size. Using silver collagen with 3 layer compression. I think the patient has some degree of PAD. He saw Steven Meza in consultation, he did not specifically comment on whether he could tolerate 4 layer compression 4/8; the wound is slightly smaller in depth. We are using 3 layer compression with silver collagen. I had him seen for arterial insufficiency however his ABI was 0.57 in the left leg. Steven Meza really did not seem to middle on the degree of compression he could tolerate 4/15; the wound is slightly smaller and slightly less deep. We have been using 3 layer compression with silver collagen. There is not an option for 4-layer compression here. 4/22; the patient is making decent progress on his wound on the left lateral calf however he arrives in with a superficial wound on the right anterior tibial area. Were not really sure how this happened. He had been using a stocking on the right leg. He has been using silver collagen on the wounds Amazing to me he is he appears to have compression pumps at home. I am not really sure I knew this before. In any case he has not been using them 4/29; left lateral calf wound continues to get gradually smaller. This looks like it is on its way to closure. The area on the right anterior tibial area is about the same in terms of size superficial without any depth. This was probably wrap injury He tells me that he has been using his compression pumps once a day for 1 hour without any pain 5/6; not as good today. Left lateral calf wound is actually larger and he does not have quite as good edema control. The area on the right anterior tibia is about the same still superficial. He states he is using his compression pumps once a day. We are using 3 layer compression on the left and Curlex Coban on the right 5/13; the area on the right is very close to being healed. The area on the left still has  some depth but has a healthy wound surface. He has excellent edema control bilaterally. Using Hydrofera Blue 5/20-Patient returns in 1 week after being in 3 layer compression on the left, the left lateral calf ulcer is stable with some slough, the right anterior tibial ulcer is healed we have been using Kerlix Coban on that leg 5/27; Hydrofera Blue and 3 layer compression. Still some slough in the wound area and some nonviable edges around the wound. 6/3; this patient has lymphedema and chronic venous insufficiency. He had difficult to close wound on his left lateral calf. This is closed today. 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 Finally he has known PAD but he is managed to tolerate the compression we reporting on him and also using his compression pumps that he already had at home twice a day 6/10; we discharge this patient last week having finally close the small area in the left lateral calf. Apparently by Friday of last week this had opened and was draining. He is therefore back in clinic. His wife who is present also was concerned about an area on the right anterior tibia Objective Constitutional Patient is hypertensive.. Pulse regular and within target range for patient.Marland Kitchen Respirations regular,  non-labored and within target range.. Temperature is normal and within the target range for the patient.Marland Kitchen appears in no distress. Vitals Time Taken: 10:53 AM, Height: 71 in, Weight: 252 lbs, BMI: 35.1, Temperature: 98.4 F, Pulse: 84 bpm, Respiratory Rate: 16 breaths/min, Blood Pressure: 154/97 mmHg. Eyes Conjunctivae clear. No discharge. Steven Meza, Steven Meza ([id number]) Lymphatic None palpable in the popliteal area. Psychiatric No evidence of depression, anxiety, or agitation. Calm, cooperative, and communicative. Appropriate interactions and affect.. General Notes: Wound exam; the small left lateral calf wound is reopened. Necrotic debris removed  with a #3 curette hemostasis with direct pressure. He also has superficial areas surrounding this. His edema in the left leg does not look particularly ominous nevertheless this is probably due to inadequate compression. He does not have an open area that I could see on the right leg Integumentary (Hair, Skin) Severe bilateral skin damage secondary to lymphedema and chronic venous insufficiency with inflammation. Wound #1 status is Open. Original cause of wound was Gradually Appeared. The wound is located on the Left,Lateral Lower Leg. The wound measures 1.5cm length x 2.3cm width x 0.1cm depth; 2.71cm^2 area and 0.271cm^3 volume. There is Fat Layer (Subcutaneous Tissue) Exposed exposed. There is no tunneling or undermining noted. There is a large amount of serous drainage noted. The wound margin is flat and intact. There is small (1-33%) red granulation within the wound bed. There is a medium (34-66%) amount of necrotic tissue within the wound bed including Adherent Slough. Wound #3 status is Open. Original cause of wound was Blister. The wound is located on the Left,Medial Lower Leg. The wound measures 1cm length x 1.3cm width x 0.1cm depth; 1.021cm^2 area and 0.102cm^3 volume. There is Fat Layer (Subcutaneous Tissue) Exposed exposed. There is no tunneling or undermining noted. There is a large amount of serous drainage noted. The wound margin is flat and intact. There is small (1-33%) red granulation within the wound bed. There is a medium (34-66%) amount of necrotic tissue within the wound bed including Adherent Slough. Wound #4 status is Open. Original cause of wound was Gradually Appeared. The wound is located on the Right,Anterior Lower Leg. The wound measures 0.1cm length x 0.1cm width x 0.1cm depth; 0.008cm^2 area and 0.001cm^3 volume. There is no tunneling or undermining noted. There is a small amount of serous drainage noted. The wound margin is flat and intact. There is no granulation  within the wound bed. There is a small (1-33%) amount of necrotic tissue within the wound bed including Adherent Slough. 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 There was a Excisional Skin/Subcutaneous Tissue Debridement with a total area of 3.45 sq cm performed by Ricard Dillon, MD. With the following instrument(s): Curette to remove Non-Viable tissue/material. Material removed includes Subcutaneous Tissue after Steven Meza, Steven Meza ([id number]) achieving pain control using Lidocaine. A time out was conducted at 11:22, prior to the start of the procedure. A Minimum amount of bleeding was controlled with Pressure. The procedure was tolerated well with a pain level of 0 throughout and a pain level of 0 following the procedure. Post Debridement Measurements: 1.5cm length x 2.3cm width x 0.1cm depth; 0.271cm^3 volume. Character of Wound/Ulcer Post Debridement is improved. Post  procedure Diagnosis Wound #1: Same as Pre-Procedure Plan Primary Wound Dressing: Wound #1 Left,Lateral Lower Leg: Silver Alginate Wound #3 Left,Medial Lower Leg: Silver Alginate Secondary Dressing: Wound #1 Left,Lateral Lower Leg: ABD pad Wound #3 Left,Medial Lower Leg: ABD pad Dressing Change Frequency: Wound #1 Left,Lateral Lower Leg: Change dressing every week - In office Wound #3 Left,Medial Lower Leg: Change dressing every week - In office Follow-up Appointments: Wound #1 Left,Lateral Lower Leg: Return Appointment in 1 week. Wound #3 Left,Medial Lower Leg: Return Appointment in 1 week. Wound #4 Right,Anterior Lower Leg: Return  Appointment in 1 week. Edema Control: Wound #1 Left,Lateral Lower Leg: 3 Layer Compression System - Left Lower Extremity Wound #3 Left,Medial Lower Leg: 3 Layer Compression System - Left Lower Extremity Wound #4 Right,Anterior Lower Leg: Other: - Patients own compression stocking 20-30 #1 we reapplied silver alginate to the areas on the left lateral lower leg which includes his original wound area and some superficial skin loss and weeping edema around this. 2. The weeping edema dictates that all of this is due to uncontrolled lymphedema. He was using 20/30 stockings as well as his compression pumps. I think he may need wraparound compression garments which we have ordered today bilaterally 3. I did not see anything on the right leg.. Will use his 20/30 stockings until the compression stockings come in. He is to continue using his compression pumps as previously twice a day 4. The patient has PAD although he is always tolerated the 3 layer compression we put on with his compression pumps. Steven Meza, Steven Meza ([id number]) Electronic Signature(s) Signed: 10/11/2018 4:01:30 PM By: Linton Ham MD Entered By: Linton Ham on 10/11/2018 12:24:32 Steven Meza ([id number]) -------------------------------------------------------------------------------- SuperBill Details Patient Name: Steven Meza Date of Service: 10/11/2018 Medical Record Number: [id number] Patient Account Number: [id number] Date of Birth/Sex: [birth year] (85 y.o. M) Treating RN: Harold Barban Primary Care Provider: Fulton Reek Other Clinician: Referring Provider: Fulton Reek Treating Provider/Extender: Tito Dine in Treatment: 21 Diagnosis Coding ICD-10 Codes Meza Description [phone] Non-pressure chronic ulcer of left calf with other specified severity I89.0 Lymphedema, not elsewhere classified I70.212 Atherosclerosis of native arteries of extremities with intermittent claudication, left  leg L97.211 Non-pressure chronic ulcer of right calf limited to breakdown of skin Facility Procedures CPT4 Meza: [id number] Description: [facility] VISIT-LEV 3 EST PT Modifier: Quantity: 1 CPT4 Meza: [id number] Description: 44920 - DEB SUBQ TISSUE 20 SQ CM/< ICD-10 Diagnosis Description L97.228 Non-pressure chronic ulcer of left calf with other specified sev I89.0 Lymphedema, not elsewhere classified Modifier: erity Quantity: 1 Physician Procedures CPT4 Meza: [id number] Description: 11042 - WC PHYS SUBQ TISS 20 SQ CM ICD-10 Diagnosis Description L97.228 Non-pressure chronic ulcer of left calf with other specified se I89.0 Lymphedema, not elsewhere classified Modifier: verity Quantity: 1 Electronic Signature(s) Signed: 10/11/2018 4:01:30 PM By: Linton Ham MD Entered By: Linton Ham on 10/11/2018 12:25:14

## 2018-10-13 NOTE — Progress Notes (Signed)
Steven Meza, Steven Meza ([id number]) Visit Report for 10/11/2018 Arrival Information Details Patient Name: Steven Meza, Steven Meza Date of Service: 10/11/2018 10:45 AM Medical Record Number: [id number] Patient Account Number: [id number] Date of Birth/Sex: 11/08/1930 (85 y.o. M) Treating RN: Army Melia Primary Care Pradyun Ishman: Fulton Reek Other Clinician: Referring Jacobe Study: Fulton Reek Treating Kaleah Hagemeister/Extender: Tito Dine in Treatment: 21 Visit Information History Since Last Visit Added or deleted any medications: No Patient Arrived: Cane Any new allergies or adverse reactions: No Arrival Time: 10:52 Had a fall or experienced change in No Accompanied By: wife activities of daily living that may affect Transfer Assistance: None risk of falls: Signs or symptoms of abuse/neglect since last visito No Hospitalized since last visit: No Pain Present Now: No Electronic Signature(s) Signed: 10/13/2018 11:05:47 AM By: Army Melia Entered By: Army Melia on 10/11/2018 10:53:32 Steven Meza ([id number]) -------------------------------------------------------------------------------- Clinic Level of Care Assessment Details Patient Name: Steven Meza Date of Service: 10/11/2018 10:45 AM Medical Record Number: [id number] Patient Account Number: [id number] Date of Birth/Sex: [birth year] (85 y.o. M) Treating RN: Harold Barban Primary Care Kerianna Rawlinson: Fulton Reek Other Clinician: Referring Sharri Loya: Fulton Reek Treating Samra Pesch/Extender: Tito Dine in Treatment: 21 Clinic Level of Care Assessment Items TOOL 3 Quantity Score []  - Use when EandM and Procedure is performed on FOLLOW-UP visit 0 ASSESSMENTS - Nursing Assessment / Reassessment X - Reassessment of Co-morbidities (includes updates in patient status) 1 10 X- 1 5 Reassessment of Adherence to Treatment Plan ASSESSMENTS - Wound and Skin Assessment / Reassessment []  - Points for Wound Assessment can  only be taken for a new wound of unknown or different 0 etiology and a procedure is NOT performed to that wound []  - 0 Simple Wound Assessment / Reassessment - one wound X- 1 5 Complex Wound Assessment / Reassessment - multiple wounds []  - 0 Dermatologic / Skin Assessment (not related to wound area) ASSESSMENTS - Focused Assessment []  - Circumferential Edema Measurements - multi extremities 0 []  - 0 Nutritional Assessment / Counseling / Intervention []  - 0 Lower Extremity Assessment (monofilament, tuning fork, pulses) []  - 0 Peripheral Arterial Disease Assessment (using hand held doppler) ASSESSMENTS - Ostomy and/or Continence Assessment and Care []  - Incontinence Assessment and Management 0 []  - 0 Ostomy Care Assessment and Management (repouching, etc.) PROCESS - Coordination of Care []  - Points for Discharge Coordination can only be taken for a new wound of unknown or different 0 etiology and a procedure is NOT performed to that wound X- 1 15 Simple Patient / Family Education for ongoing care []  - 0 Complex (extensive) Patient / Family Education for ongoing care []  - 0 Staff obtains Programmer, systems, Records, Test Results / Process Orders []  - 0 Staff telephones HHA, Nursing Homes / Clarify orders / etc []  - 0 Routine Transfer to another Facility (non-emergent condition) []  - 0 Routine Hospital Admission (non-emergent condition) Steven Meza, Steven Meza ([id number]) []  - 0 New Admissions / Biomedical engineer / Ordering NPWT, Apligraf, etc. []  - 0 Emergency Hospital Admission (emergent condition) X- 1 10 Simple Discharge Coordination []  - 0 Complex (extensive) Discharge Coordination PROCESS - Special Needs []  - Pediatric / Minor Patient Management 0 []  - 0 Isolation Patient Management []  - 0 Hearing / Language / Visual special needs []  - 0 Assessment of Community assistance (transportation, D/C planning, etc.) []  - 0 Additional assistance / Altered mentation []  -  0 Support Surface(s) Assessment (bed, cushion, seat, etc.) INTERVENTIONS - Wound Cleansing / Measurement []  - Points  for Wound Cleaning / Measurement, Wound Dressing, Specimen Collection and 0 Specimen taken to lab can only be taken for a new wound of unknown or different etiology and a procedure is NOT performed to that wound []  - 0 Simple Wound Cleansing - one wound X- 3 5 Complex Wound Cleansing - multiple wounds X- 1 5 Wound Imaging (photographs - any number of wounds) []  - 0 Wound Tracing (instead of photographs) []  - 0 Simple Wound Measurement - one wound X- 3 5 Complex Wound Measurement - multiple wounds INTERVENTIONS - Wound Dressings X - Small Wound Dressing one or multiple wounds 2 10 []  - 0 Medium Wound Dressing one or multiple wounds []  - 0 Large Wound Dressing one or multiple wounds INTERVENTIONS - Miscellaneous []  - External ear exam 0 []  - 0 Specimen Collection (cultures, biopsies, blood, body fluids, etc.) []  - 0 Specimen(s) / Culture(s) sent or taken to Lab for analysis []  - 0 Patient Transfer (multiple staff / Harrel Lemon Lift / Similar devices) []  - 0 Simple Staple / Suture removal (25 or less) []  - 0 Complex Staple / Suture removal (26 or more) Steven Meza, Steven Meza ([id number]) []  - 0 Hypo / Hyperglycemic Management (close monitor of Blood Glucose) []  - 0 Ankle / Brachial Index (ABI) - do not check if billed separately X- 1 5 Vital Signs Has the patient been seen at the hospital within the last three years: Yes Total Score: 105 Level Of Care: New/Established - Level 3 Electronic Signature(s) Signed: 10/11/2018 4:18:59 PM By: Harold Barban Entered By: Harold Barban on 10/11/2018 11:50:44 Steven Meza ([id number]) -------------------------------------------------------------------------------- Encounter Discharge Information Details Patient Name: Steven Meza Date of Service: 10/11/2018 10:45 AM Medical Record Number: [id number] Patient  Account Number: [id number] Date of Birth/Sex: 1930/12/22 (85 y.o. M) Treating RN: Army Melia Primary Care Natasia Sanko: Fulton Reek Other Clinician: Referring Orphia Mctigue: Fulton Reek Treating Steven Meza/Extender: Tito Dine in Treatment: 21 Encounter Discharge Information Items Post Procedure Vitals Discharge Condition: Stable Temperature (F): 98.4 Ambulatory Status: Cane Pulse (bpm): 84 Discharge Destination: Home Respiratory Rate (breaths/min): 16 Transportation: Private Auto Blood Pressure (mmHg): 154/87 Accompanied By: wife Schedule Follow-up Appointment: Yes Clinical Summary of Care: Electronic Signature(s) Signed: 10/13/2018 11:05:47 AM By: Army Melia Entered By: Army Melia on 10/11/2018 11:45:43 Steven Meza ([id number]) -------------------------------------------------------------------------------- Lower Extremity Assessment Details Patient Name: Steven Meza Date of Service: 10/11/2018 10:45 AM Medical Record Number: [id number] Patient Account Number: [id number] Date of Birth/Sex: 23-Oct-1930 (85 y.o. M) Treating RN: Army Melia Primary Care Armando Lauman: Fulton Reek Other Clinician: Referring Allen Basista: Fulton Reek Treating Carollee Nussbaumer/Extender: Tito Dine in Treatment: 21 Edema Assessment Assessed: [Left: No] [Right: No] Edema: [Left: Yes] [Right: Yes] Calf Left: Right: Point of Measurement: 34 cm From Medial Instep 47 cm 45 cm Ankle Left: Right: Point of Measurement: 12 cm From Medial Instep 30 cm 30 cm Vascular Assessment Pulses: Dorsalis Pedis Palpable: [Left:Yes] [Right:Yes] Electronic Signature(s) Signed: 10/13/2018 11:05:47 AM By: Army Melia Entered By: Army Melia on 10/11/2018 11:03:01 Steven Meza ([id number]) -------------------------------------------------------------------------------- Multi Wound Chart Details Patient Name: Steven Meza Date of Service: 10/11/2018 10:45 AM Medical Record  Number: [id number] Patient Account Number: [id number] Date of Birth/Sex: 22-Sep-1930 (85 y.o. M) Treating RN: Harold Barban Primary Care Loria Lacina: Fulton Reek Other Clinician: Referring Mahasin Riviere: Fulton Reek Treating Landyn Buckalew/Extender: Tito Dine in Treatment: 21 Vital Signs Height(in): 71 Pulse(bpm): 84 Weight(lbs): 252 Blood Pressure(mmHg): 154/97 Body Mass Index(BMI): 35 Temperature(F): 98.4 Respiratory Rate 16 (breaths/min): Photos: [1:No Photos] [  3:No Photos] Wound Location: Left Lower Leg - Lateral Left Lower Leg - Medial Right Lower Leg - Anterior Wounding Event: Gradually Appeared Blister Gradually Appeared Primary Etiology: Lymphedema Venous Leg Ulcer Lymphedema Comorbid History: Lymphedema, Coronary Artery Lymphedema, Coronary Artery Lymphedema, Coronary Artery Disease, Hypertension Disease, Hypertension Disease, Hypertension Date Acquired: 11/14/2017 10/10/2018 10/09/2018 Weeks of Treatment: 21 0 0 Wound Status: Open Open Open Clustered Wound: Yes No No Measurements L x W x D 1.5x2.3x0.1 1x1.3x0.1 0.1x0.1x0.1 (cm) Area (cm) : 2.71 1.021 0.008 Volume (cm) : 0.271 0.102 0.001 % Reduction in Area: 13.70% N/A 0.00% % Reduction in Volume: 13.70% N/A 0.00% Classification: Partial Thickness Partial Thickness Partial Thickness Exudate Amount: Large Large Small Exudate Type: Serous Serous Serous Exudate Color: amber amber amber Wound Margin: Flat and Intact Flat and Intact Flat and Intact Granulation Amount: Small (1-33%) Small (1-33%) None Present (0%) Granulation Quality: Red Red N/A Necrotic Amount: Medium (34-66%) Medium (34-66%) Small (1-33%) Exposed Structures: Fat Layer (Subcutaneous Fat Layer (Subcutaneous Fascia: No Tissue) Exposed: Yes Tissue) Exposed: Yes Fat Layer (Subcutaneous Fascia: No Fascia: No Tissue) Exposed: No Tendon: No Tendon: No Tendon: No Muscle: No Muscle: No Muscle: No Steven Meza, Steven Meza ([id number]) Joint:  No Joint: No Joint: No Bone: No Bone: No Bone: No Epithelialization: None None None Debridement: Debridement - Excisional N/A N/A Pre-procedure 11:22 N/A N/A Verification/Time Out Taken: Pain Control: Lidocaine N/A N/A Tissue Debrided: Subcutaneous N/A N/A Level: Skin/Subcutaneous Tissue N/A N/A Debridement Area (sq cm): 3.45 N/A N/A Instrument: Curette N/A N/A Bleeding: Minimum N/A N/A Hemostasis Achieved: Pressure N/A N/A Procedural Pain: 0 N/A N/A Post Procedural Pain: 0 N/A N/A Debridement Treatment Procedure was tolerated well N/A N/A Response: Post Debridement 1.5x2.3x0.1 N/A N/A Measurements L x W x D (cm) Post Debridement Volume: 0.271 N/A N/A (cm) Procedures Performed: Debridement N/A N/A Treatment Notes Wound #1 (Left, Lateral Lower Leg) Notes TCA, silver aginate, 3 layer Wound #3 (Left, Medial Lower Leg) Notes TCA, silver aginate, 3 layer Electronic Signature(s) Signed: 10/11/2018 4:01:30 PM By: Linton Ham MD Entered By: Linton Ham on 10/11/2018 12:15:45 Steven Meza ([id number]) -------------------------------------------------------------------------------- Multi-Disciplinary Care Plan Details Patient Name: Steven Meza Date of Service: 10/11/2018 10:45 AM Medical Record Number: [id number] Patient Account Number: [id number] Date of Birth/Sex: June 27, 1930 (85 y.o. M) Treating RN: Harold Barban Primary Care Cree Napoli: Fulton Reek Other Clinician: Referring Caelan Atchley: Fulton Reek Treating Thedford Bunton/Extender: Tito Dine in Treatment: 21 Active Inactive Electronic Signature(s) Signed: 10/11/2018 4:18:59 PM By: Harold Barban Entered By: Harold Barban on 10/11/2018 11:19:13 Steven Meza ([id number]) -------------------------------------------------------------------------------- Pain Assessment Details Patient Name: Steven Meza Date of Service: 10/11/2018 10:45 AM Medical Record Number: [id number] Patient  Account Number: [id number] Date of Birth/Sex: Mar 01, 1931 (85 y.o. M) Treating RN: Army Melia Primary Care Yazmen Briones: Fulton Reek Other Clinician: Referring Hoover Grewe: Fulton Reek Treating Andrew Blasius/Extender: Tito Dine in Treatment: 21 Active Problems Location of Pain Severity and Description of Pain Patient Has Paino No Site Locations Pain Management and Medication Current Pain Management: Electronic Signature(s) Signed: 10/13/2018 11:05:47 AM By: Army Melia Entered By: Army Melia on 10/11/2018 10:53:40 Steven Meza ([id number]) -------------------------------------------------------------------------------- Patient/Caregiver Education Details Patient Name: Steven Meza Date of Service: 10/11/2018 10:45 AM Medical Record Number: [id number] Patient Account Number: [id number] Date of Birth/Gender: [birth year] (85 y.o. M) Treating RN: Harold Barban Primary Care Physician: Fulton Reek Other Clinician: Referring Physician: Fulton Reek Treating Physician/Extender: Tito Dine in Treatment: 21 Education Assessment Education Provided To: Patient Education Topics Provided Wound/Skin Impairment:  Handouts: Caring for Your Ulcer Methods: Demonstration, Explain/Verbal Responses: State content correctly Electronic Signature(s) Signed: 10/11/2018 4:18:59 PM By: Harold Barban Entered By: Harold Barban on 10/11/2018 11:19:50 Steven Meza ([id number]) -------------------------------------------------------------------------------- Wound Assessment Details Patient Name: Steven Meza Date of Service: 10/11/2018 10:45 AM Medical Record Number: [id number] Patient Account Number: [id number] Date of Birth/Sex: 05/29/1930 (85 y.o. M) Treating RN: Army Melia Primary Care Luisantonio Adinolfi: Fulton Reek Other Clinician: Referring Pierrette Scheu: Fulton Reek Treating Michaila Kenney/Extender: Tito Dine in Treatment: 21 Wound Status Wound  Number: 1 Primary Lymphedema Etiology: Wound Location: Left Lower Leg - Lateral Wound Status: Open Wounding Event: Gradually Appeared Comorbid Lymphedema, Coronary Artery Disease, Date Acquired: 11/14/2017 History: Hypertension Weeks Of Treatment: 21 Clustered Wound: Yes Photos Photo Uploaded By: Army Melia on 10/11/2018 13:04:07 Wound Measurements Length: (cm) 1.5 Width: (cm) 2.3 Depth: (cm) 0.1 Area: (cm) 2.71 Volume: (cm) 0.271 % Reduction in Area: 13.7% % Reduction in Volume: 13.7% Epithelialization: None Tunneling: No Undermining: No Wound Description Classification: Partial Thickness Wound Margin: Flat and Intact Exudate Amount: Large Exudate Type: Serous Exudate Color: amber Foul Odor After Cleansing: No Slough/Fibrino Yes Wound Bed Granulation Amount: Small (1-33%) Exposed Structure Granulation Quality: Red Fascia Exposed: No Necrotic Amount: Medium (34-66%) Fat Layer (Subcutaneous Tissue) Exposed: Yes Necrotic Quality: Adherent Slough Tendon Exposed: No Muscle Exposed: No Joint Exposed: No Bone Exposed: No Treatment Notes Steven Meza, Steven Meza ([id number]) Wound #1 (Left, Lateral Lower Leg) Notes TCA, silver aginate, 3 layer Electronic Signature(s) Signed: 10/13/2018 11:05:47 AM By: Army Melia Entered By: Army Melia on 10/11/2018 10:58:52 Steven Meza ([id number]) -------------------------------------------------------------------------------- Wound Assessment Details Patient Name: Steven Meza Date of Service: 10/11/2018 10:45 AM Medical Record Number: [id number] Patient Account Number: [id number] Date of Birth/Sex: [birth year] (85 y.o. M) Treating RN: Army Melia Primary Care Janaiyah Blackard: Fulton Reek Other Clinician: Referring Zelena Bushong: Fulton Reek Treating Salar Molden/Extender: Tito Dine in Treatment: 21 Wound Status Wound Number: 3 Primary Venous Leg Ulcer Etiology: Wound Location: Left Lower Leg - Medial Wound  Status: Open Wounding Event: Blister Comorbid Lymphedema, Coronary Artery Disease, Date Acquired: 10/10/2018 History: Hypertension Weeks Of Treatment: 0 Clustered Wound: No Photos Photo Uploaded By: Army Melia on 10/11/2018 13:04:08 Wound Measurements Length: (cm) 1 Width: (cm) 1.3 Depth: (cm) 0.1 Area: (cm) 1.021 Volume: (cm) 0.102 % Reduction in Area: % Reduction in Volume: Epithelialization: None Tunneling: No Undermining: No Wound Description Classification: Partial Thickness Wound Margin: Flat and Intact Exudate Amount: Large Exudate Type: Serous Exudate Color: amber Foul Odor After Cleansing: No Slough/Fibrino Yes Wound Bed Granulation Amount: Small (1-33%) Exposed Structure Granulation Quality: Red Fascia Exposed: No Necrotic Amount: Medium (34-66%) Fat Layer (Subcutaneous Tissue) Exposed: Yes Necrotic Quality: Adherent Slough Tendon Exposed: No Muscle Exposed: No Joint Exposed: No Bone Exposed: No Treatment Notes Steven Meza, Steven Meza ([id number]) Wound #3 (Left, Medial Lower Leg) Notes TCA, silver aginate, 3 layer Electronic Signature(s) Signed: 10/13/2018 11:05:47 AM By: Army Melia Entered By: Army Melia on 10/11/2018 11:00:50 Steven Meza ([id number]) -------------------------------------------------------------------------------- Wound Assessment Details Patient Name: Steven Meza Date of Service: 10/11/2018 10:45 AM Medical Record Number: [id number] Patient Account Number: [id number] Date of Birth/Sex: April 05, 1931 (85 y.o. M) Treating RN: Harold Barban Primary Care Millissa Deese: Fulton Reek Other Clinician: Referring Leeana Creer: Fulton Reek Treating Janzen Sacks/Extender: Tito Dine in Treatment: 21 Wound Status Wound Number: 4 Primary Lymphedema Etiology: Wound Location: Right Lower Leg - Anterior Wound Status: Open Wounding Event: Gradually Appeared Comorbid Lymphedema, Coronary Artery Disease, Date Acquired:  10/09/2018 History: Hypertension Weeks Of  Treatment: 0 Clustered Wound: No Photos Wound Measurements Length: (cm) 0.1 % Reduction Width: (cm) 0.1 % Reduction Depth: (cm) 0.1 Epithelializ Area: (cm) 0.008 Tunneling: Volume: (cm) 0.001 Undermining in Area: 0% in Volume: 0% ation: None No : No Wound Description Classification: Partial Thickness Foul Odor Af Wound Margin: Flat and Intact Slough/Fibri Exudate Amount: Small Exudate Type: Serous Exudate Color: amber ter Cleansing: No no Yes Wound Bed Granulation Amount: None Present (0%) Exposed Structure Necrotic Amount: Small (1-33%) Fascia Exposed: No Necrotic Quality: Adherent Slough Fat Layer (Subcutaneous Tissue) Exposed: No Tendon Exposed: No Muscle Exposed: No Joint Exposed: No Bone Exposed: No Electronic Signature(s) Steven Meza, Steven Meza ([id number]) Signed: 10/11/2018 4:18:59 PM By: Harold Barban Entered By: Harold Barban on 10/11/2018 11:33:13 Steven Meza ([id number]) -------------------------------------------------------------------------------- [place] Details Patient Name: Steven Meza Date of Service: 10/11/2018 10:45 AM Medical Record Number: [id number] Patient Account Number: [id number] Date of Birth/Sex: 01/14/1931 (85 y.o. M) Treating RN: Army Melia Primary Care Ysabel Cowgill: Fulton Reek Other Clinician: Referring Aleathea Pugmire: Fulton Reek Treating Fumie Fiallo/Extender: Tito Dine in Treatment: 21 Vital Signs Time Taken: 10:53 Temperature (F): 98.4 Height (in): 71 Pulse (bpm): 84 Weight (lbs): 252 Respiratory Rate (breaths/min): 16 Body Mass Index (BMI): 35.1 Blood Pressure (mmHg): 154/97 Reference Range: 80 - 120 mg / dl Electronic Signature(s) Signed: 10/13/2018 11:05:47 AM By: Army Melia Entered By: Army Melia on 10/11/2018 10:53:55

## 2018-10-18 ENCOUNTER — Encounter: Payer: Medicare Other | Admitting: Internal Medicine

## 2018-10-18 ENCOUNTER — Other Ambulatory Visit: Payer: Self-pay

## 2018-10-18 DIAGNOSIS — L97228 Non-pressure chronic ulcer of left calf with other specified severity: Secondary | ICD-10-CM | POA: Diagnosis not present

## 2018-10-19 NOTE — Progress Notes (Signed)
LUIGI, STUCKEY ([id number]) Visit Report for 10/18/2018 HPI Details Patient Name: Steven Meza, Steven Meza Date of Service: 10/18/2018 1:45 PM Medical Record Number: [id number] Patient Account Number: [id number] Date of Birth/Sex: 12-26-1930 (85 y.o. M) Treating RN: Cornell Barman Primary Care Provider: Fulton Reek Other Clinician: Referring Provider: Fulton Reek Treating Provider/Extender: Tito Dine in Treatment: 22 History of Present Illness HPI Description: ADMISSION 05/17/2018 Mr. Depass is an 85 year old man who is been treated at the lymphedema clinic for a long period with lymphedema wraps for bilateral lower extremity lymphedema. About 6 months ago they noted small open areas on his left lateral calf just above the ankle. These were open. They are apparently putting some form of ointment on this. They have been referred here for our review of this. The patient has a long history of lymphedema and venous stasis. It says in his records that he has recurrent cellulitis although his wife denies this but she states that the lymphedema may have started with cellulitis several years ago. Also in his record there is a history of bladder cancer which they seem to know very little about however he did not have any radiation to the pelvis or anything that could have contributed to lymphedema that they are aware of. There is no prior wound history. The patient has 2 small punched out areas on the left lateral calf that have adherent debris on the surface. Past medical history; lymphedema, hypertension, cellulitis, hearing loss, morbid obesity, osteoarthritis, venous stasis, bladder CA, diverticulosis. ABIs in our clinic were 0.36 on the right and 0.57 on the left 05/24/2018; corrected age on this patient is 67 versus what I said last week. He has been for his arterial studies which predictably are not very good. On the right his ABI is 0.65. Monophasic waveforms noted at the right ankle  including the posterior tibial and dorsalis pedis. He has triphasic waveforms of the common femoral and profunda femoris triphasic proximal SFA with monophasic distal FSA and popliteal artery. Monophasic tibial waveforms. On the left his ABI is 0.58 monophasic waveforms at the left ankle. Duplex of the left lower extremity demonstrated atherosclerotic change with monophasic waveforms throughout the left lower extremity. There was some concern about left iliac disease and potentially femoral-popliteal and tibial disease. The patient does not describe claudication although according to his wife he over emphasizes his activity. Patient states he is limited by pain in both knees His wounds are on the left lateral calf. 2 small areas with necrotic debris. We have been using silver collagen and light Ace wraps 05/31/2018; wounds on the left lateral calf in the setting of very significant lymphedema and probably PAD. We have been using silver collagen. The base of the small wound looks reasonably improved. I sent him to see Dr. Fletcher Anon however I see now he has an appointment with Dr. Alvester Chou on February 12 2/5; left lateral calf wound looks the same. His wife is doing a good job with her lymphedema wraps and maintaining that the edema. He most likely has PAD and is due to see Dr. Gwenlyn Found of infectious disease on February 12 2/19; left lateral calf wounds look about the same. This looks like wound secondary to chronic venous insufficiency with lymphedema however the patient has very poor arterial studies. We referred him to Dr. Gwenlyn Found. Dr. Gwenlyn Found did not feel he needed to do anything from an arterial point of view. He did not address the question about the aggressiveness of compression. After some thoughts about  this I elected to go ahead and put him in 3 layer compression which after all would be less compression then the lymphedema clinic was putting on this. I am hopeful that this should be enough to get  some closure of the small wounds 2/26; left lateral calf wound letter this week. He tolerated the 3 layer compression well furthermore he is sleeping in a hospital bed which helps keep his legs up at night. The wound looks better measuring smaller especially in width 3/3; left lateral calf wound about the same size. The 3 layer compression has really helped get the edema down in his leg AISEA, BOULDIN A. ([id number]) however. Using silver collagen 3/11; left lateral calf wound better looking wound surface but about the same size. We have been using 3 layer compression which is really helped get the edema down in the left leg. Even after Dr. Kennon Holter assessment I am reluctant to go to 4 layer compression. He is tolerating 3 layer compression well. We have been using silver collagen 3/18-Patient returns for his left lateral calf wound which overall is looking better, slightly increased in size and dimensions and area, he is tolerating the 3 layer compression, has been dressed with silver collagen which we will continue 3/25oleft lateral calf wound much better looking. Size is smaller. He has been using silver collagen 4/1; gradually getting smaller in size. Using silver collagen with 3 layer compression. I think the patient has some degree of PAD. He saw Dr. Gwenlyn Found in consultation, he did not specifically comment on whether he could tolerate 4 layer compression 4/8; the wound is slightly smaller in depth. We are using 3 layer compression with silver collagen. I had him seen for arterial insufficiency however his ABI was 0.57 in the left leg. Dr. Gwenlyn Found really did not seem to middle on the degree of compression he could tolerate 4/15; the wound is slightly smaller and slightly less deep. We have been using 3 layer compression with silver collagen. There is not an option for 4-layer compression here. 4/22; the patient is making decent progress on his wound on the left lateral calf however he arrives in  with a superficial wound on the right anterior tibial area. Were not really sure how this happened. He had been using a stocking on the right leg. He has been using silver collagen on the wounds Amazing to me he is he appears to have compression pumps at home. I am not really sure I knew this before. In any case he has not been using them 4/29; left lateral calf wound continues to get gradually smaller. This looks like it is on its way to closure. oThe area on the right anterior tibial area is about the same in terms of size superficial without any depth. This was probably wrap injury oHe tells me that he has been using his compression pumps once a day for 1 hour without any pain 5/6; not as good today. Left lateral calf wound is actually larger and he does not have quite as good edema control. The area on the right anterior tibia is about the same still superficial. He states he is using his compression pumps once a day. We are using 3 layer compression on the left and Curlex Coban on the right 5/13; the area on the right is very close to being healed. The area on the left still has some depth but has a healthy wound surface. He has excellent edema control bilaterally. Using Hydrofera Blue 5/20-Patient returns  in 1 week after being in 3 layer compression on the left, the left lateral calf ulcer is stable with some slough, the right anterior tibial ulcer is healed we have been using Kerlix Coban on that leg 5/27; Hydrofera Blue and 3 layer compression. Still some slough in the wound area and some nonviable edges around the wound. 6/3; this patient has lymphedema and chronic venous insufficiency. He had difficult to close wound on his left lateral calf. This is closed today. 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 Finally he has known PAD but he is managed to tolerate the compression we reporting on him and also using his compression pumps that he already  had at home twice a day 6/10; we discharge this patient last week having finally close the small area in the left lateral calf. Apparently by Friday of last week this had opened and was draining. He is therefore back in clinic. His wife who is present also was concerned about an area on the right anterior tibia."o 6/17; the patient does not have an open area on the right leg. On the left he has a small circular area anteriorly and the same area laterally that he had last time. However both of these appear to be improved he has his Farrow wrap 30/40. It turns out that he is only using his external compression pumps once a day. I tried to get him up to twice a day today Electronic Signature(s) Signed: 10/18/2018 6:03:55 PM By: Linton Ham MD Entered By: Linton Ham on 10/18/2018 14:29:32 [name] ([id number]) -------------------------------------------------------------------------------- Physical Exam Details Patient Name: [name] Date of Service: 10/18/2018 1:45 PM Medical Record Number: [id number] Patient Account Number: [id number] Date of Birth/Sex: 1931-01-09 (85 y.o. M) Treating RN: Cornell Barman Primary Care Provider: Fulton Reek Other Clinician: Referring Provider: Fulton Reek Treating Provider/Extender: Tito Dine in Treatment: 22 Constitutional Sitting or standing Blood Pressure is within target range for patient.. Pulse regular and within target range for patient.Marland Kitchen Respirations regular, non-labored and within target range.. Temperature is normal and within the target range for the patient.Marland Kitchen appears in no distress. Eyes Conjunctivae clear. No discharge. Respiratory Respiratory effort is easy and symmetric bilaterally. Rate is normal at rest and on room air.. Bilateral breath sounds are clear and equal in all lobes with no wheezes, rales or rhonchi.. Cardiovascular Edema control is excellent. Integumentary (Hair, Skin) The patient's  wife is concerned about blisters but these are not blisters there are small nodules associated with uncontrolled lymphedema. These are present on his bilateral legs. Psychiatric No evidence of depression, anxiety, or agitation. Calm, cooperative, and communicative. Appropriate interactions and affect.. Notes Wound exam; he has a small area on the left anterior pretibial area and the left lateral calf. No debridement is required. The wounds were vigorously debrided with Anasept and gauze. oHe does not have an open area on the right leg Electronic Signature(s) Signed: 10/18/2018 6:03:55 PM By: Linton Ham MD Entered By: Linton Ham on 10/18/2018 14:31:03 [name] ([id number]) -------------------------------------------------------------------------------- Physician Orders Details Patient Name: [name] Date of Service: 10/18/2018 1:45 PM Medical Record Number: [id number] Patient Account Number: [id number] Date of Birth/Sex: [birth year] (85 y.o. M) Treating RN: Cornell Barman Primary Care Provider: Fulton Reek Other Clinician: Referring Provider: Fulton Reek Treating Provider/Extender: Tito Dine in Treatment: 67 Verbal / Phone Orders: No Diagnosis Coding Primary Wound Dressing Wound #1 Left,Lateral Lower Leg o Silver Alginate Wound #3 Left,Medial  Lower Leg o Silver Alginate Secondary Dressing Wound #1 Left,Lateral Lower Leg o ABD pad Wound #3 Left,Medial Lower Leg o ABD pad Dressing Change Frequency Wound #1 Left,Lateral Lower Leg o Change dressing every week - In office Wound #3 Left,Medial Lower Leg o Change dressing every week - In office Follow-up Appointments Wound #1 Left,Lateral Lower Leg o Return Appointment in 1 week. Wound #3 Left,Medial Lower Leg o Return Appointment in 1 week. Edema Control Wound #1 Left,Lateral Lower Leg o 3 Layer Compression System - Left Lower Extremity o Patient to wear own Velcro  compression garment. - Right Wound #3 Left,Medial Lower Leg o 3 Layer Compression System - Left Lower Extremity o Patient to wear own Velcro compression garment. - Right Electronic Signature(s) Signed: 10/18/2018 6:03:55 PM By: Linton Ham MD Signed: 10/19/2018 4:58:07 PM By: Gretta Cool, BSN, RN, CWS, Kim RN, BSN Entered By: Gretta Cool, BSN, RN, CWS, Kim on 10/18/2018 14:21:27 [name] ([id number]) BLUFORD, SEDLER ([id number]) -------------------------------------------------------------------------------- Problem List Details Patient Name: [name] Date of Service: 10/18/2018 1:45 PM Medical Record Number: [id number] Patient Account Number: [id number] Date of Birth/Sex: 04-Jul-1930 (85 y.o. 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 with 05/17/2018 No Yes intermittent claudication, left leg L97.211 Non-pressure chronic ulcer of right calf limited to breakdown 08/23/2018 No Yes of skin Inactive Problems Resolved Problems Electronic Signature(s) Signed: 10/18/2018 6:03:55 PM By: Linton Ham MD Entered By: Linton Ham on 10/18/2018 14:24:53 [name] ([id number]) -------------------------------------------------------------------------------- Progress Note Details Patient Name: [name] Date of Service: 10/18/2018 1:45 PM Medical Record Number: [id number] Patient Account Number: [id number] Date of Birth/Sex: March 24, 1931 (85 y.o. M) Treating RN: Cornell Barman Primary Care Provider: Fulton Reek Other  Clinician: Referring Provider: Fulton Reek Treating Provider/Extender: Tito Dine in Treatment: 22 Subjective History of Present Illness (HPI) ADMISSION 05/17/2018 Mr. Yawn is an 85 year old man who is been treated at the lymphedema clinic for a long period with lymphedema wraps for bilateral lower extremity lymphedema. About 6 months ago they noted small open areas on his left lateral calf just above the ankle. These were open. They are apparently putting some form of ointment on this. They have been referred here for our review of this. The patient has a long history of lymphedema and venous stasis. It says in his records that he has recurrent cellulitis although his wife denies this but she states that the lymphedema may have started with cellulitis several years ago. Also in his record there is a history of bladder cancer which they seem to know very little about however he did not have any radiation to the pelvis or anything that could have contributed to lymphedema that they are aware of. There is no prior wound history. The patient has 2 small punched out areas on the left lateral calf that have adherent debris on the surface. Past medical history; lymphedema, hypertension, cellulitis, hearing loss, morbid obesity, osteoarthritis, venous stasis, bladder CA, diverticulosis. ABIs in our clinic were 0.36 on the right and 0.57 on the left 05/24/2018; corrected age on this patient is 20 versus what I said last week. He has been for his arterial studies which predictably  are not very good. On the right his ABI is 0.65. Monophasic waveforms noted at the right ankle including the posterior tibial and dorsalis pedis. He has triphasic waveforms of the common femoral and profunda femoris triphasic proximal SFA with monophasic distal FSA and popliteal artery. Monophasic tibial waveforms. On the left his ABI is 0.58 monophasic waveforms at the left ankle. Duplex of the left lower  extremity demonstrated atherosclerotic change with monophasic waveforms throughout the left lower extremity. There was some concern about left iliac disease and potentially femoral-popliteal and tibial disease. The patient does not describe claudication although according to his wife he over emphasizes his activity. Patient states he is limited by pain in both knees His wounds are on the left lateral calf. 2 small areas with necrotic debris. We have been using silver collagen and light Ace wraps 05/31/2018; wounds on the left lateral calf in the setting of very significant lymphedema and probably PAD. We have been using silver collagen. The base of the small wound looks reasonably improved. I sent him to see Dr. Fletcher Anon however I see now he has an appointment with Dr. Alvester Chou on February 12 2/5; left lateral calf wound looks the same. His wife is doing a good job with her lymphedema wraps and maintaining that the edema. He most likely has PAD and is due to see Dr. Gwenlyn Found of infectious disease on February 12 2/19; left lateral calf wounds look about the same. This looks like wound secondary to chronic venous insufficiency with lymphedema however the patient has very poor arterial studies. We referred him to Dr. Gwenlyn Found. Dr. Gwenlyn Found did not feel he needed to do anything from an arterial point of view. He did not address the question about the aggressiveness of compression. After some thoughts about this I elected to go ahead and put him in 3 layer compression which after all would be less compression then the lymphedema clinic was putting on this. I am hopeful that this should be enough to get some closure of the small wounds 2/26; left lateral calf wound letter this week. He tolerated the 3 layer compression well furthermore he is sleeping in a hospital bed which helps keep his legs up at night. The wound looks better measuring smaller especially in width 3/3; left lateral calf wound about the same size.  The 3 layer compression has really helped get the edema down in his leg however. Using silver collagen 3/11; left lateral calf wound better looking wound surface but about the same size. We have been using 3 layer compression which is really helped get the edema down in the left leg. Even after Dr. Kennon Holter assessment I am reluctant to go to 4 layer TYREN, DUGAR A. ([id number]) compression. He is tolerating 3 layer compression well. We have been using silver collagen 3/18-Patient returns for his left lateral calf wound which overall is looking better, slightly increased in size and dimensions and area, he is tolerating the 3 layer compression, has been dressed with silver collagen which we will continue 3/25 left lateral calf wound much better looking. Size is smaller. He has been using silver collagen 4/1; gradually getting smaller in size. Using silver collagen with 3 layer compression. I think the patient has some degree of PAD. He saw Dr. Gwenlyn Found in consultation, he did not specifically comment on whether he could tolerate 4 layer compression 4/8; the wound is slightly smaller in depth. We are using 3 layer compression with silver collagen. I had him seen for arterial insufficiency  however his ABI was 0.57 in the left leg. Dr. Gwenlyn Found really did not seem to middle on the degree of compression he could tolerate 4/15; the wound is slightly smaller and slightly less deep. We have been using 3 layer compression with silver collagen. There is not an option for 4-layer compression here. 4/22; the patient is making decent progress on his wound on the left lateral calf however he arrives in with a superficial wound on the right anterior tibial area. Were not really sure how this happened. He had been using a stocking on the right leg. He has been using silver collagen on the wounds Amazing to me he is he appears to have compression pumps at home. I am not really sure I knew this before. In any case  he has not been using them 4/29; left lateral calf wound continues to get gradually smaller. This looks like it is on its way to closure. The area on the right anterior tibial area is about the same in terms of size superficial without any depth. This was probably wrap injury He tells me that he has been using his compression pumps once a day for 1 hour without any pain 5/6; not as good today. Left lateral calf wound is actually larger and he does not have quite as good edema control. The area on the right anterior tibia is about the same still superficial. He states he is using his compression pumps once a day. We are using 3 layer compression on the left and Curlex Coban on the right 5/13; the area on the right is very close to being healed. The area on the left still has some depth but has a healthy wound surface. He has excellent edema control bilaterally. Using Hydrofera Blue 5/20-Patient returns in 1 week after being in 3 layer compression on the left, the left lateral calf ulcer is stable with some slough, the right anterior tibial ulcer is healed we have been using Kerlix Coban on that leg 5/27; Hydrofera Blue and 3 layer compression. Still some slough in the wound area and some nonviable edges around the wound. 6/3; this patient has lymphedema and chronic venous insufficiency. He had difficult to close wound on his left lateral calf. This is closed today. 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 Finally he has known PAD but he is managed to tolerate the compression we reporting on him and also using his compression pumps that he already had at home twice a day 6/10; we discharge this patient last week having finally close the small area in the left lateral calf. Apparently by Friday of last week this had opened and was draining. He is therefore back in clinic. His wife who is present also was concerned about an area on the right anterior  tibia."o 6/17; the patient does not have an open area on the right leg. On the left he has a small circular area anteriorly and the same area laterally that he had last time. However both of these appear to be improved he has his Farrow wrap 30/40. It turns out that he is only using his external compression pumps once a day. I tried to get him up to twice a day today Objective Constitutional Sitting or standing Blood Pressure is within target range for patient.. Pulse regular and within target range for patient.Marland Kitchen Respirations regular, non-labored and within target range.. Temperature is normal and within the target range for the patient.Marland Kitchen appears in  no distress. Vitals Time Taken: 1:47 PM, Height: 71 in, Weight: 252 lbs, BMI: 35.1, Temperature: 98.7 F, Pulse: 85 bpm, Respiratory Rate: 18 breaths/min, Blood Pressure: 135/51 mmHg. KIEFFER, BLATZ ([id number]) Eyes Conjunctivae clear. No discharge. Respiratory Respiratory effort is easy and symmetric bilaterally. Rate is normal at rest and on room air.. Bilateral breath sounds are clear and equal in all lobes with no wheezes, rales or rhonchi.. Cardiovascular Edema control is excellent. Psychiatric No evidence of depression, anxiety, or agitation. Calm, cooperative, and communicative. Appropriate interactions and affect.. General Notes: Wound exam; he has a small area on the left anterior pretibial area and the left lateral calf. No debridement is required. The wounds were vigorously debrided with Anasept and gauze. He does not have an open area on the right leg Integumentary (Hair, Skin) The patient's wife is concerned about blisters but these are not blisters there are small nodules associated with uncontrolled lymphedema. These are present on his bilateral legs. Wound #1 status is Open. Original cause of wound was Gradually Appeared. The wound is located on the Left,Lateral Lower Leg. The wound measures 1.3cm length x 0.9cm width x  0.1cm depth; 0.919cm^2 area and 0.092cm^3 volume. There is Fat Layer (Subcutaneous Tissue) Exposed exposed. There is no tunneling or undermining noted. There is a large amount of serous drainage noted. The wound margin is flat and intact. There is small (1-33%) red granulation within the wound bed. There is a medium (34-66%) amount of necrotic tissue within the wound bed including Adherent Slough. Wound #3 status is Open. Original cause of wound was Blister. The wound is located on the Left,Medial Lower Leg. The wound measures 0.5cm length x 0.5cm width x 0.1cm depth; 0.196cm^2 area and 0.02cm^3 volume. There is Fat Layer (Subcutaneous Tissue) Exposed exposed. There is no tunneling or undermining noted. There is a medium amount of serous drainage noted. The wound margin is flat and intact. There is large (67-100%) red granulation within the wound bed. There is no necrotic tissue within the wound bed. Wound #4 status is Healed - Epithelialized. Original cause of wound was Gradually Appeared. The wound is located on the Right,Anterior Lower Leg. The wound measures 0cm length x 0cm width x 0cm depth; 0cm^2 area and 0cm^3 volume. There is no tunneling or undermining noted. There is a small amount of serous drainage noted. The wound margin is flat and intact. There is no granulation within the wound bed. There is a small (1-33%) amount of necrotic tissue within the wound bed including Adherent Slough. 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 There was a Three Layer Compression Therapy Procedure with a pre-treatment ABI of 0.6 by Cornell Barman, RN. Post procedure Diagnosis Wound #3: Same as Pre-Procedure Plan Primary Wound Dressing: Wound #1 Left,Lateral Lower Leg: Silver Alginate Wound #3 Left,Medial Lower Leg: Silver Alginate Secondary Dressing: Wound #1 Left,Lateral Lower Leg: ABD pad Wound #3 Left,Medial Lower Leg: ABD pad  Dressing Change Frequency: Wound #1 Left,Lateral Lower Leg: Change dressing every week - In office Wound #3 Left,Medial Lower Leg: Change dressing every week - In office Follow-up Appointments: Wound #1 Left,Lateral Lower Leg: Return Appointment in 1 week. Wound #3 Left,Medial Lower Leg: Return Appointment in 1 week. Edema Control: Wound #1 Left,Lateral Lower Leg: 3 Layer Compression System - Left Lower Extremity Patient to wear own Velcro compression garment. - Right Wound #3 Left,Medial Lower Leg: 3 Layer Compression System - Left Lower Extremity Patient to wear own Velcro compression garment. - Right 1. We put the patient's external compression garment on the right leg 2. Continue to wrap the left leg with silver alginate to both wear wounds under 3 layer compression 3. I I am hopeful he will not have to go back to the lymphedema clinic 4. Follow-up in 1 week AMALIO, LOE ([id number]) Electronic Signature(s) Signed: 10/18/2018 6:03:55 PM By: Linton Ham MD Entered By: Linton Ham on 10/18/2018 14:34:38 [name] ([id number]) -------------------------------------------------------------------------------- SuperBill Details Patient Name: [name] Date of Service: 10/18/2018 Medical Record Number:  [id number] Patient Account Number: [id number] Date of Birth/Sex: 06/21/1930 (85 y.o. M) Treating RN: Cornell Barman Primary Care Provider: Fulton Reek Other Clinician: Referring Provider: Fulton Reek Treating Provider/Extender: Tito Dine in Treatment: 22 Diagnosis Coding ICD-10 Codes Code Description [id number] Non-pressure chronic ulcer of left calf with other specified severity I89.0 Lymphedema, not elsewhere classified I70.212 Atherosclerosis of native arteries of extremities with intermittent claudication, left leg L97.211 Non-pressure chronic ulcer of right calf limited to breakdown of skin Facility Procedures CPT4 Code: 45809983 Description: (Facility Use Only) [facility] LWR LT LEG Modifier: Quantity: 1 Physician Procedures CPT4 Code: 3825053 Description: 97673 - WC PHYS LEVEL 3 - EST PT ICD-10 Diagnosis Description L97.228 Non-pressure chronic ulcer of left calf with other specified se I89.0 Lymphedema, not elsewhere classified L97.211 Non-pressure chronic ulcer of right calf limited to  breakdown o Modifier: verity f skin Quantity: 1 Electronic Signature(s) Signed: 10/18/2018 6:03:55 PM By: Linton Ham MD Entered By: Linton Ham on 10/18/2018 14:35:03

## 2018-10-19 NOTE — Progress Notes (Signed)
Steven, Meza ([id number]) Visit Report for 10/18/2018 Arrival Information Details Patient Name: Steven Meza, Steven Meza Date of Service: 10/18/2018 1:45 PM Medical Record Number: [id number] Patient Account Number: [id number] Date of Birth/Sex: 1930/10/14 (85 y.o. M) Treating RN: Montey Hora Primary Care Kaleah Hagemeister: Fulton Reek Other Clinician: Referring Jden Want: Fulton Reek Treating Robertha Staples/Extender: Tito Dine in Treatment: 69 Visit Information History Since Last Visit Added or deleted any medications: No Patient Arrived: Cane Any new allergies or adverse reactions: No Arrival Time: 13:46 Had a fall or experienced change in No Accompanied By: wife activities of daily living that may affect Transfer Assistance: None risk of falls: Patient Identification Verified: Yes Signs or symptoms of abuse/neglect since last visito No Secondary Verification Process Completed: Yes Hospitalized since last visit: No Implantable device outside of the clinic excluding No cellular tissue based products placed in the center since last visit: Has Dressing in Place as Prescribed: Yes Has Compression in Place as Prescribed: Yes Pain Present Now: No Electronic Signature(s) Signed: 10/18/2018 4:48:03 PM By: Montey Hora Entered By: Montey Hora on 10/18/2018 13:46:38 Steven Meza ([id number]) -------------------------------------------------------------------------------- Compression Therapy Details Patient Name: Steven Meza Date of Service: 10/18/2018 1:45 PM Medical Record Number: [id number] Patient Account Number: [id number] Date of Birth/Sex: 13-Mar-1931 (85 y.o. M) Treating RN: Cornell Barman Primary Care Bre Pecina: Fulton Reek Other Clinician: Referring Deborha Moseley: Fulton Reek Treating Betzaira Mentel/Extender: Tito Dine in Treatment: 22 Compression Therapy Performed for Wound Assessment: Wound #3 Left,Medial Lower Leg Performed By: Clinician Cornell Barman,  RN Compression Type: Three Layer Pre Treatment ABI: 0.6 Post Procedure Diagnosis Same as Pre-procedure Electronic Signature(s) Signed: 10/19/2018 4:58:07 PM By: Gretta Cool, BSN, RN, CWS, Kim RN, BSN Entered By: Gretta Cool, BSN, RN, CWS, Kim on 10/18/2018 14:17:34 Steven Meza ([id number]) -------------------------------------------------------------------------------- Encounter Discharge Information Details Patient Name: Steven Meza Date of Service: 10/18/2018 1:45 PM Medical Record Number: [id number] Patient Account Number: [id number] Date of Birth/Sex: 05-07-1930 (85 y.o. M) Treating RN: Montey Hora Primary Care Dawit Tankard: Fulton Reek Other Clinician: Referring Jan Walters: Fulton Reek Treating Atom Solivan/Extender: Tito Dine in Treatment: 29 Encounter Discharge Information Items Discharge Condition: Stable Ambulatory Status: Cane Discharge Destination: Home Transportation: Private Auto Accompanied By: spouse Schedule Follow-up Appointment: Yes Clinical Summary of Care: Electronic Signature(s) Signed: 10/18/2018 3:48:12 PM By: Montey Hora Entered By: Montey Hora on 10/18/2018 15:48:11 Steven Meza ([id number]) -------------------------------------------------------------------------------- Lower Extremity Assessment Details Patient Name: Steven Meza Date of Service: 10/18/2018 1:45 PM Medical Record Number: [id number] Patient Account Number: [id number] Date of Birth/Sex: Feb 27, 1931 (85 y.o. M) Treating RN: Harold Barban Primary Care Kenneisha Cochrane: Fulton Reek Other Clinician: Referring Leyan Branden: Fulton Reek Treating Bryauna Byrum/Extender: Tito Dine in Treatment: 22 Edema Assessment Assessed: [Left: No] [Right: No] Edema: [Left: Yes] [Right: Yes] Calf Left: Right: Point of Measurement: 34 cm From Medial Instep 44 cm 43.5 cm Ankle Left: Right: Point of Measurement: 12 cm From Medial Instep 28 cm 29 cm Vascular  Assessment Pulses: Dorsalis Pedis Palpable: [Left:Yes] [Right:Yes] Electronic Signature(s) Signed: 10/18/2018 4:39:36 PM By: Harold Barban Entered By: Harold Barban on 10/18/2018 14:02:24 Steven Meza ([id number]) -------------------------------------------------------------------------------- Multi Wound Chart Details Patient Name: Steven Meza Date of Service: 10/18/2018 1:45 PM Medical Record Number: [id number] Patient Account Number: [id number] Date of Birth/Sex: Oct 01, 1930 (85 y.o. M) Treating RN: Cornell Barman Primary Care Thurston Brendlinger: Fulton Reek Other Clinician: Referring Giavonni Fonder: Fulton Reek Treating Wynee Matarazzo/Extender: Tito Dine in Treatment: 22 Vital Signs Height(in): 71 Pulse(bpm): 10 Weight(lbs): 252 Blood Pressure(mmHg): 135/51 Body Mass  Index(BMI): 35 Temperature(F): 98.7 Respiratory Rate 18 (breaths/min): Photos: Wound Location: Left Lower Leg - Lateral Left Lower Leg - Medial Right, Anterior Lower Leg Wounding Event: Gradually Appeared Blister Gradually Appeared Primary Etiology: Lymphedema Venous Leg Ulcer Lymphedema Comorbid History: Lymphedema, Coronary Artery Lymphedema, Coronary Artery Lymphedema, Coronary Artery Disease, Hypertension Disease, Hypertension Disease, Hypertension Date Acquired: 11/14/2017 10/10/2018 10/09/2018 Weeks of Treatment: 22 1 1  Wound Status: Open Open Healed - Epithelialized Clustered Wound: Yes No No Measurements L x W x D 1.3x0.9x0.1 0.5x0.5x0.1 0x0x0 (cm) Area (cm) : 0.919 0.196 0 Volume (cm) : 0.092 0.02 0 % Reduction in Area: 70.80% 80.80% 100.00% % Reduction in Volume: 70.70% 80.40% 100.00% Classification: Partial Thickness Partial Thickness Partial Thickness Exudate Amount: Large Medium Small Exudate Type: Serous Serous Serous Exudate Color: amber amber amber Wound Margin: Flat and Intact Flat and Intact Flat and Intact Granulation Amount: Small (1-33%) Large (67-100%) None Present  (0%) Granulation Quality: Red Red N/A Necrotic Amount: Medium (34-66%) None Present (0%) Small (1-33%) Exposed Structures: Fat Layer (Subcutaneous Fat Layer (Subcutaneous Fascia: No Tissue) Exposed: Yes Tissue) Exposed: Yes Fat Layer (Subcutaneous Fascia: No Fascia: No Tissue) Exposed: No Tendon: No Tendon: No Tendon: No Muscle: No Muscle: No Muscle: No REHMAN, LEVINSON ([id number]) Joint: No Joint: No Joint: No Bone: No Bone: No Bone: No Epithelialization: None Medium (34-66%) None Procedures Performed: N/A Compression Therapy N/A Treatment Notes Electronic Signature(s) Signed: 10/18/2018 6:03:55 PM By: Linton Ham MD Entered By: Linton Ham on 10/18/2018 14:25:04 Steven Meza ([id number]) -------------------------------------------------------------------------------- Multi-Disciplinary Care Plan Details Patient Name: Steven Meza Date of Service: 10/18/2018 1:45 PM Medical Record Number: [id number] Patient Account Number: [id number] Date of Birth/Sex: [birth year] (85 y.o. M) Treating RN: Cornell Barman Primary Care Skylur Fuston: Fulton Reek Other Clinician: Referring Kamaile Zachow: Fulton Reek Treating Deray Dawes/Extender: Tito Dine in Treatment: 47 Active Inactive Orientation to the Wound Care Program Nursing Diagnoses: Knowledge deficit related to the wound healing center program Goals: Patient/caregiver will verbalize understanding of the [place] Date Initiated: 05/17/2018 Target Resolution Date: 06/17/2018 Goal Status: Active Interventions: Provide education on orientation to the wound center Notes: Venous Leg Ulcer Nursing Diagnoses: Potential for venous Insuffiency (use before diagnosis confirmed) Goals: Patient/caregiver will verbalize understanding of disease process and disease management Date Initiated: 05/17/2018 Target Resolution Date: 06/17/2018 Goal Status: Active Interventions: Assess peripheral edema  status every visit. Treatment Activities: Non-invasive vascular studies : 05/17/2018 Notes: Wound/Skin Impairment Nursing Diagnoses: Impaired tissue integrity Goals: Patient/caregiver will verbalize understanding of skin care regimen Date Initiated: 05/17/2018 Target Resolution Date: 05/17/2018 Goal Status: Active KHALEEL, BECKOM ([id number]) Ulcer/skin breakdown will heal within 14 weeks Date Initiated: 05/17/2018 Target Resolution Date: 08/16/2018 Goal Status: Active Interventions: Assess ulceration(s) every visit Treatment Activities: Referred to DME Arly Salminen for dressing supplies : 05/17/2018 Topical wound management initiated : 05/17/2018 Notes: Electronic Signature(s) Signed: 10/19/2018 4:58:07 PM By: Gretta Cool, BSN, RN, CWS, Kim RN, BSN Entered By: Gretta Cool, BSN, RN, CWS, Kim on 10/18/2018 14:16:27 Steven Meza ([id number]) -------------------------------------------------------------------------------- Pain Assessment Details Patient Name: Steven Meza Date of Service: 10/18/2018 1:45 PM Medical Record Number: [id number] Patient Account Number: [id number] Date of Birth/Sex: 1930/07/31 (85 y.o. M) Treating RN: Montey Hora Primary Care Tyrik Stetzer: Fulton Reek Other Clinician: Referring Uva Runkel: Fulton Reek Treating Markala Sitts/Extender: Tito Dine in Treatment: 22 Active Problems Location of Pain Severity and Description of Pain Patient Has Paino No Site Locations Pain Management and Medication Current Pain Management: Electronic Signature(s) Signed: 10/18/2018 4:48:03 PM By: Montey Hora Entered  By: Montey Hora on 10/18/2018 13:46:45 Steven Meza ([id number]) -------------------------------------------------------------------------------- Patient/Caregiver Education Details Patient Name: Steven Meza Date of Service: 10/18/2018 1:45 PM Medical Record Number: [id number] Patient Account Number: [id number] Date of Birth/Gender: 11/16/1930  (85 y.o. M) Treating RN: Cornell Barman Primary Care Physician: Fulton Reek Other Clinician: Referring Physician: Fulton Reek Treating Physician/Extender: Tito Dine in Treatment: 22 Education Assessment Education Provided To: Patient Education Topics Provided Venous: Handouts: Controlling Swelling with Compression Stockings Methods: Demonstration, Explain/Verbal Responses: State content correctly Wound/Skin Impairment: Handouts: Caring for Your Ulcer Methods: Demonstration, Explain/Verbal Responses: State content correctly Electronic Signature(s) Signed: 10/19/2018 4:58:07 PM By: Gretta Cool, BSN, RN, CWS, Kim RN, BSN Entered By: Gretta Cool, BSN, RN, CWS, Kim on 10/18/2018 14:22:10 Steven Meza ([id number]) -------------------------------------------------------------------------------- Wound Assessment Details Patient Name: Steven Meza Date of Service: 10/18/2018 1:45 PM Medical Record Number: [id number] Patient Account Number: [id number] Date of Birth/Sex: 1930-05-29 (85 y.o. M) Treating RN: Harold Barban Primary Care Toniya Rozar: Fulton Reek Other Clinician: Referring Vraj Denardo: Fulton Reek Treating Rhylei Mcquaig/Extender: Tito Dine in Treatment: 22 Wound Status Wound Number: 1 Primary Lymphedema Etiology: Wound Location: Left Lower Leg - Lateral Wound Status: Open Wounding Event: Gradually Appeared Comorbid Lymphedema, Coronary Artery Disease, Date Acquired: 11/14/2017 History: Hypertension Weeks Of Treatment: 22 Clustered Wound: Yes Photos Wound Measurements Length: (cm) 1.3 Width: (cm) 0.9 Depth: (cm) 0.1 Area: (cm) 0.919 Volume: (cm) 0.092 % Reduction in Area: 70.8% % Reduction in Volume: 70.7% Epithelialization: None Tunneling: No Undermining: No Wound Description Classification: Partial Thickness Foul Odor Af Wound Margin: Flat and Intact Slough/Fibri Exudate Amount: Large Exudate Type: Serous Exudate Color:  amber ter Cleansing: No no Yes Wound Bed Granulation Amount: Small (1-33%) Exposed Structure Granulation Quality: Red Fascia Exposed: No Necrotic Amount: Medium (34-66%) Fat Layer (Subcutaneous Tissue) Exposed: Yes Necrotic Quality: Adherent Slough Tendon Exposed: No Muscle Exposed: No Joint Exposed: No Bone Exposed: No Treatment Notes SHYQUAN, STALLBAUMER ([id number]) Wound #1 (Left, Lateral Lower Leg) Notes TCA, silver aginate, 3 layer Electronic Signature(s) Signed: 10/18/2018 4:39:36 PM By: Harold Barban Entered By: Harold Barban on 10/18/2018 14:00:10 Steven Meza ([id number]) -------------------------------------------------------------------------------- Wound Assessment Details Patient Name: Steven Meza Date of Service: 10/18/2018 1:45 PM Medical Record Number: [id number] Patient Account Number: [id number] Date of Birth/Sex: 1930-12-21 (85 y.o. M) Treating RN: Harold Barban Primary Care Ferman Basilio: Fulton Reek Other Clinician: Referring Delno Blaisdell: Fulton Reek Treating Jeryn Cerney/Extender: Tito Dine in Treatment: 22 Wound Status Wound Number: 3 Primary Venous Leg Ulcer Etiology: Wound Location: Left Lower Leg - Medial Wound Status: Open Wounding Event: Blister Comorbid Lymphedema, Coronary Artery Disease, Date Acquired: 10/10/2018 History: Hypertension Weeks Of Treatment: 1 Clustered Wound: No Photos Wound Measurements Length: (cm) 0.5 Width: (cm) 0.5 Depth: (cm) 0.1 Area: (cm) 0.196 Volume: (cm) 0.02 % Reduction in Area: 80.8% % Reduction in Volume: 80.4% Epithelialization: Medium (34-66%) Tunneling: No Undermining: No Wound Description Classification: Partial Thickness Foul Odor Af Wound Margin: Flat and Intact Slough/Fibri Exudate Amount: Medium Exudate Type: Serous Exudate Color: amber ter Cleansing: No no No Wound Bed Granulation Amount: Large (67-100%) Exposed Structure Granulation Quality: Red Fascia Exposed:  No Necrotic Amount: None Present (0%) Fat Layer (Subcutaneous Tissue) Exposed: Yes Tendon Exposed: No Muscle Exposed: No Joint Exposed: No Bone Exposed: No Treatment Notes ZYKEEM, BAUSERMAN ([id number]) Wound #3 (Left, Medial Lower Leg) Notes TCA, silver aginate, 3 layer Electronic Signature(s) Signed: 10/18/2018 4:39:36 PM By: Harold Barban Entered By: Harold Barban on 10/18/2018 13:59:08 Steven Meza ([id number]) -------------------------------------------------------------------------------- Wound Assessment  Details Patient Name: JAMIER, URBAS Date of Service: 10/18/2018 1:45 PM Medical Record Number: [id number] Patient Account Number: [id number] Date of Birth/Sex: 06-30-1930 (85 y.o. M) Treating RN: Cornell Barman Primary Care Remonia Otte: Fulton Reek Other Clinician: Referring Diara Chaudhari: Fulton Reek Treating Zyra Parrillo/Extender: Tito Dine in Treatment: 22 Wound Status Wound Number: 4 Primary Lymphedema Etiology: Wound Location: Right, Anterior Lower Leg Wound Status: Healed - Epithelialized Wounding Event: Gradually Appeared Comorbid Lymphedema, Coronary Artery Disease, Date Acquired: 10/09/2018 History: Hypertension Weeks Of Treatment: 1 Clustered Wound: No Photos Wound Measurements Length: (cm) 0 % Reduct Width: (cm) 0 % Reduct Depth: (cm) 0 Epitheli Area: (cm) 0 Tunneli Volume: (cm) 0 Undermi ion in Area: 100% ion in Volume: 100% alization: None ng: No ning: No Wound Description Classification: Partial Thickness Wound Margin: Flat and Intact Exudate Amount: Small Exudate Type: Serous Exudate Color: amber Foul Odor After Cleansing: No Slough/Fibrino Yes Wound Bed Granulation Amount: None Present (0%) Exposed Structure Necrotic Amount: Small (1-33%) Fascia Exposed: No Necrotic Quality: Adherent Slough Fat Layer (Subcutaneous Tissue) Exposed: No Tendon Exposed: No Muscle Exposed: No Joint Exposed: No Bone Exposed:  No Electronic Signature(s) JSEAN, TAUSSIG ([id number]) Signed: 10/19/2018 4:58:07 PM By: Gretta Cool, BSN, RN, CWS, Kim RN, BSN Entered By: Gretta Cool, BSN, RN, CWS, Kim on 10/18/2018 14:18:22 Steven Meza ([id number]) -------------------------------------------------------------------------------- Vitals Details Patient Name: Steven Meza Date of Service: 10/18/2018 1:45 PM Medical Record Number: [id number] Patient Account Number: [id number] Date of Birth/Sex: [birth year] (85 y.o. M) Treating RN: Montey Hora Primary Care Moreen Piggott: Fulton Reek Other Clinician: Referring Darlene Brozowski: Fulton Reek Treating Jarron Curley/Extender: Tito Dine in Treatment: 22 Vital Signs Time Taken: 13:47 Temperature (F): 98.7 Height (in): 71 Pulse (bpm): 85 Weight (lbs): 252 Respiratory Rate (breaths/min): 18 Body Mass Index (BMI): 35.1 Blood Pressure (mmHg): 135/51 Reference Range: 80 - 120 mg / dl Electronic Signature(s) Signed: 10/18/2018 4:48:03 PM By: Montey Hora Entered By: Montey Hora on 10/18/2018 13:47:27

## 2018-10-23 ENCOUNTER — Ambulatory Visit (INDEPENDENT_AMBULATORY_CARE_PROVIDER_SITE_OTHER): Payer: Medicare Other | Admitting: Podiatry

## 2018-10-23 ENCOUNTER — Encounter: Payer: Self-pay | Admitting: Podiatry

## 2018-10-23 ENCOUNTER — Other Ambulatory Visit: Payer: Self-pay

## 2018-10-23 DIAGNOSIS — B351 Tinea unguium: Secondary | ICD-10-CM | POA: Diagnosis not present

## 2018-10-23 DIAGNOSIS — M79675 Pain in left toe(s): Secondary | ICD-10-CM | POA: Diagnosis not present

## 2018-10-23 DIAGNOSIS — M79674 Pain in right toe(s): Secondary | ICD-10-CM | POA: Diagnosis not present

## 2018-10-23 DIAGNOSIS — I878 Other specified disorders of veins: Secondary | ICD-10-CM

## 2018-10-23 NOTE — Progress Notes (Signed)
Complaint:  Visit Type: Patient returns to my office for continued preventative foot care services. Complaint: Patient states" my nails have grown long and thick and become painful to walk and wear shoes"   Patient is wearing an unna boot on his left foot/ankle.  Patient says he is here solely for nail care. The patient presents for preventative foot care services. No changes to ROS  Podiatric Exam: Vascular: dorsalis pedis and posterior tibial pulses are palpable bilateral. Capillary return is immediate. Temperature gradient is WNL. Skin turgor WNL  Sensorium: Normal Semmes Weinstein monofilament test. Normal tactile sensation bilaterally. Nail Exam: Pt has thick disfigured discolored nails with subungual debris noted bilateral entire nail hallux through fifth toenails Ulcer Exam: There is no evidence of ulcer or pre-ulcerative changes or infection. Orthopedic Exam: Muscle tone and strength are WNL. No limitations in general ROM. No crepitus or effusions noted. Foot type and digits show no abnormalities. Bony prominences are unremarkable. Skin: No Porokeratosis. No infection or ulcers  Diagnosis:  Onychomycosis, , Pain in right toe, pain in left toes  Treatment & Plan Procedures and Treatment: Consent by patient was obtained for treatment procedures.   Debridement of mycotic and hypertrophic toenails, 1 through 5 bilateral and clearing of subungual debris. No ulceration, no infection noted.  Return Visit-Office Procedure: Patient instructed to return to the office for a follow up visit 3 months for continued evaluation and treatment.    Gardiner Barefoot DPM

## 2018-10-25 ENCOUNTER — Other Ambulatory Visit: Payer: Self-pay

## 2018-10-25 ENCOUNTER — Encounter: Payer: Medicare Other | Admitting: Internal Medicine

## 2018-10-25 DIAGNOSIS — L97228 Non-pressure chronic ulcer of left calf with other specified severity: Secondary | ICD-10-CM | POA: Diagnosis not present

## 2018-10-28 NOTE — Progress Notes (Signed)
[name], [name] ([id number]) Visit Report for 10/25/2018 HPI Details Patient Name: Steven Meza, Steven Meza Date of Service: 10/25/2018 11:00 AM Medical Record Number: [id number] Patient Account Number: [id number] Date of Birth/Sex: 06-18-1930 (85 y.o. M) Treating RN: Cornell Barman Primary Care Provider: Fulton Reek Other Clinician: Referring Provider: Fulton Reek Treating Provider/Extender: Tito Dine in Treatment: 23 History of Present Illness HPI Description: ADMISSION 05/17/2018 Steven Meza is an 85 year old man who is been treated at the lymphedema clinic for a long period with lymphedema wraps for bilateral lower extremity lymphedema. About 6 months ago they noted small open areas on his left lateral calf just above the ankle. These were open. They are apparently putting some form of ointment on this. They have been referred here for our review of this. The patient has a long history of lymphedema and venous stasis. It says in his records that he has recurrent cellulitis although his wife denies this but she states that the lymphedema may have started with cellulitis several years ago. Also in his record there is a history of bladder cancer which they seem to know very little about however he did not have any radiation to the pelvis or anything that could have contributed to lymphedema that they are aware of. There is no prior wound history. The patient has 2 small punched out areas on the left lateral calf that have adherent debris on the surface. Past medical history; lymphedema, hypertension, cellulitis, hearing loss, morbid obesity, osteoarthritis, venous stasis, bladder CA, diverticulosis. ABIs in our clinic were 0.36 on the right and 0.57 on the left 05/24/2018; corrected age on this patient is 44 versus what I said last week. He has been for his arterial studies which predictably are not very good. On the right his ABI is 0.65. Monophasic waveforms noted at the right ankle  including the posterior tibial and dorsalis pedis. He has triphasic waveforms of the common femoral and profunda femoris triphasic proximal SFA with monophasic distal FSA and popliteal artery. Monophasic tibial waveforms. On the left his ABI is 0.58 monophasic waveforms at the left ankle. Duplex of the left lower extremity demonstrated atherosclerotic change with monophasic waveforms throughout the left lower extremity. There was some concern about left iliac disease and potentially femoral-popliteal and tibial disease. The patient does not describe claudication although according to his wife he over emphasizes his activity. Patient states he is limited by pain in both knees His wounds are on the left lateral calf. 2 small areas with necrotic debris. We have been using silver collagen and light Ace wraps 05/31/2018; wounds on the left lateral calf in the setting of very significant lymphedema and probably PAD. We have been using silver collagen. The base of the small wound looks reasonably improved. I sent him to see Dr. Fletcher Anon however I see now he has an appointment with Dr. Alvester Chou on February 12 2/5; left lateral calf wound looks the same. His wife is doing a good job with her lymphedema wraps and maintaining that the edema. He most likely has PAD and is due to see Dr. Gwenlyn Found of infectious disease on February 12 2/19; left lateral calf wounds look about the same. This looks like wound secondary to chronic venous insufficiency with lymphedema however the patient has very poor arterial studies. We referred him to Dr. Gwenlyn Found. Dr. Gwenlyn Found did not feel he needed to do anything from an arterial point of view. He did not address the question about the aggressiveness of compression. After some thoughts about  this I elected to go ahead and put him in 3 layer compression which after all would be less compression then the lymphedema clinic was putting on this. I am hopeful that this should be enough to get  some closure of the small wounds 2/26; left lateral calf wound letter this week. He tolerated the 3 layer compression well furthermore he is sleeping in a hospital bed which helps keep his legs up at night. The wound looks better measuring smaller especially in width 3/3; left lateral calf wound about the same size. The 3 layer compression has really helped get the edema down in his leg Steven, Meza A. ([id number]) however. Using silver collagen 3/11; left lateral calf wound better looking wound surface but about the same size. We have been using 3 layer compression which is really helped get the edema down in the left leg. Even after Dr. Kennon Holter assessment I am reluctant to go to 4 layer compression. He is tolerating 3 layer compression well. We have been using silver collagen 3/18-Patient returns for his left lateral calf wound which overall is looking better, slightly increased in size and dimensions and area, he is tolerating the 3 layer compression, has been dressed with silver collagen which we will continue 3/25oleft lateral calf wound much better looking. Size is smaller. He has been using silver collagen 4/1; gradually getting smaller in size. Using silver collagen with 3 layer compression. I think the patient has some degree of PAD. He saw Dr. Gwenlyn Found in consultation, he did not specifically comment on whether he could tolerate 4 layer compression 4/8; the wound is slightly smaller in depth. We are using 3 layer compression with silver collagen. I had him seen for arterial insufficiency however his ABI was 0.57 in the left leg. Dr. Gwenlyn Found really did not seem to middle on the degree of compression he could tolerate 4/15; the wound is slightly smaller and slightly less deep. We have been using 3 layer compression with silver collagen. There is not an option for 4-layer compression here. 4/22; the patient is making decent progress on his wound on the left lateral calf however he arrives in  with a superficial wound on the right anterior tibial area. Were not really sure how this happened. He had been using a stocking on the right leg. He has been using silver collagen on the wounds Amazing to me he is he appears to have compression pumps at home. I am not really sure I knew this before. In any case he has not been using them 4/29; left lateral calf wound continues to get gradually smaller. This looks like it is on its way to closure. oThe area on the right anterior tibial area is about the same in terms of size superficial without any depth. This was probably wrap injury oHe tells me that he has been using his compression pumps once a day for 1 hour without any pain 5/6; not as good today. Left lateral calf wound is actually larger and he does not have quite as good edema control. The area on the right anterior tibia is about the same still superficial. He states he is using his compression pumps once a day. We are using 3 layer compression on the left and Curlex Coban on the right 5/13; the area on the right is very close to being healed. The area on the left still has some depth but has a healthy wound surface. He has excellent edema control bilaterally. Using Hydrofera Blue 5/20-Patient returns  in 1 week after being in 3 layer compression on the left, the left lateral calf ulcer is stable with some slough, the right anterior tibial ulcer is healed we have been using Kerlix Coban on that leg 5/27; Hydrofera Blue and 3 layer compression. Still some slough in the wound area and some nonviable edges around the wound. 6/3; this patient has lymphedema and chronic venous insufficiency. He had difficult to close wound on his left lateral calf. This is closed today. 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 Finally he has known PAD but he is managed to tolerate the compression we reporting on him and also using his compression pumps that he already  had at home twice a day 6/10; we discharge this patient last week having finally close the small area in the left lateral calf. Apparently by Friday of last week this had opened and was draining. He is therefore back in clinic. His wife who is present also was concerned about an area on the right anterior tibia."o 6/17; the patient does not have an open area on the right leg. On the left he has a small circular area anteriorly and the same area laterally that he had last time. However both of these appear to be improved he has his Farrow wrap 30/40. It turns out that he is only using his external compression pumps once a day. I tried to get him up to twice a day today 6/24; the patient comes in with his external compression garment on the right leg/Farrow wrap. As far as we know no open wounds here. He has 2 areas that are smaller one on the left anterior pretibial and one on the left lateral calf which is the site of his initial wounds. I have successfully caught him into his external compression pumps twice a day Electronic Signature(s) Signed: 10/25/2018 4:13:17 PM By: Linton Ham MD Entered By: Linton Ham on 10/25/2018 11:22:30 [name] ([id number]) -------------------------------------------------------------------------------- Physical Exam Details Patient Name: [name] Date of Service: 10/25/2018 11:00 AM Medical Record Number: [id number] Patient Account Number: [id number] Date of Birth/Sex: June 19, 1930 (85 y.o. M) Treating RN: Cornell Barman Primary Care Provider: Fulton Reek Other Clinician: Referring Provider: Fulton Reek Treating Provider/Extender: Tito Dine in Treatment: 23 Constitutional Sitting or standing Blood Pressure is within target range for patient.. Pulse regular and within target range for patient.Marland Kitchen Respirations regular, non-labored and within target range.. Temperature is normal and within the target range for the  patient.Marland Kitchen appears in no distress. Ears, Nose, Mouth, and Throat Patient is hard of hearing.Marland Kitchen Respiratory Respiratory effort is easy and symmetric bilaterally. Rate is normal at rest and on room air.. Cardiovascular I can feel a reduced dorsalis pedis pulse on the left. With very good edema control, chronic lymphedema and stasis dermatitis.. Integumentary (Hair, Skin) Lymphedema and stasis dermatitis. Notes Wound exam; he has a small area on the left anterior pre-tibia and left lateral calf. Both of these appear to have come in there is superficial without any depth. No debridement was necessary. Electronic Signature(s) Signed: 10/25/2018 4:13:17 PM By: Linton Ham MD Entered By: Linton Ham on 10/25/2018 11:26:54 [name] ([id number]) -------------------------------------------------------------------------------- Physician Orders Details Patient Name: [name] Date of Service: 10/25/2018 11:00 AM Medical Record Number: [id number] Patient Account Number: [id number] Date of Birth/Sex: [birth year] (85 y.o. M) Treating RN: Cornell Barman Primary Care Provider: Fulton Reek Other Clinician: Referring Provider: Fulton Reek Treating Provider/Extender: Tito Dine  in Treatment: 23 Verbal / Phone Orders: No Diagnosis Coding Wound Cleansing Wound #1 Left,Lateral Lower Leg o Dial antibacterial soap, wash wounds, rinse and pat dry prior to dressing wounds Wound #3 Left,Medial Lower Leg o Dial antibacterial soap, wash wounds, rinse and pat dry prior to dressing wounds Primary Wound Dressing Wound #1 Left,Lateral Lower Leg o Silver Alginate Wound #3 Left,Medial Lower Leg o Silver Alginate Secondary Dressing Wound #1 Left,Lateral Lower Leg o ABD pad Wound #3 Left,Medial Lower Leg o ABD pad Dressing Change Frequency Wound #1 Left,Lateral Lower Leg o Change dressing every week - In office Wound #3 Left,Medial Lower Leg o Change  dressing every week - In office Follow-up Appointments Wound #1 Left,Lateral Lower Leg o Return Appointment in 1 week. Wound #3 Left,Medial Lower Leg o Return Appointment in 1 week. Edema Control Wound #1 Left,Lateral Lower Leg o 3 Layer Compression System - Left Lower Extremity o Patient to wear own Velcro compression garment. - Right Wound #3 Left,Medial Lower Leg o 3 Layer Compression System - Left Lower Extremity o Patient to wear own Velcro compression garment. - Right SHAIDEN, ALDOUS ([id number]) o Compression Pump: Use compression pump on left lower extremity for 60 minutes, twice daily. o Compression Pump: Use compression pump on right lower extremity for 60 minutes, twice daily. Electronic Signature(s) Signed: 10/25/2018 4:13:17 PM By: Linton Ham MD Signed: 10/27/2018 4:38:11 PM By: Gretta Cool, BSN, RN, CWS, Kim RN, BSN Entered By: Gretta Cool, BSN, RN, CWS, Kim on 10/25/2018 11:15:49 [name] ([id number]) -------------------------------------------------------------------------------- Problem List Details Patient Name: [name] Date of Service: 10/25/2018 11:00 AM Medical Record Number: [id number] Patient Account Number: [id number] Date of Birth/Sex: 1931-04-25 (85 y.o. 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 with 05/17/2018 No Yes intermittent claudication, left leg L97.211 Non-pressure chronic ulcer of right calf limited to breakdown 08/23/2018 No Yes of skin Inactive Problems Resolved  Problems Electronic Signature(s) Signed: 10/25/2018 4:13:17 PM By: Linton Ham MD Entered By: Linton Ham on 10/25/2018 11:20:35 [name] ([id number]) -------------------------------------------------------------------------------- Progress Note Details Patient Name: [name] Date of Service: 10/25/2018 11:00 AM Medical Record Number: [id number] Patient Account Number: [id number] Date of Birth/Sex: 1930-08-27 (85 y.o. M) Treating RN: Cornell Barman Primary Care Provider: Fulton Reek Other Clinician: Referring Provider: Fulton Reek Treating Provider/Extender: Tito Dine in Treatment: 23 Subjective History of Present Illness (HPI) ADMISSION 05/17/2018 Mr. Mcgrory is an 85 year old man who is been treated at the lymphedema clinic for a long period with lymphedema wraps for bilateral lower extremity lymphedema. About 6 months ago they noted small open areas on his left lateral calf just above the ankle. These were open. They are apparently putting some form of ointment on this. They have been referred here for our review of this. The patient has a long history of lymphedema and venous stasis. It says in his records that he has recurrent cellulitis although his wife denies this but she states that the lymphedema may have started with cellulitis several years ago. Also in his record there is a history of bladder cancer which they seem to know very little about however he did not have any radiation to  the pelvis or anything that could have contributed to lymphedema that they are aware of. There is no prior wound history. The patient has 2 small punched out areas on the left lateral calf that have adherent debris on the surface. Past medical history; lymphedema, hypertension, cellulitis, hearing loss, morbid obesity, osteoarthritis, venous stasis, bladder CA, diverticulosis. ABIs in our clinic were 0.36 on the right and 0.57 on the left 05/24/2018; corrected  age on this patient is 71 versus what I said last week. He has been for his arterial studies which predictably are not very good. On the right his ABI is 0.65. Monophasic waveforms noted at the right ankle including the posterior tibial and dorsalis pedis. He has triphasic waveforms of the common femoral and profunda femoris triphasic proximal SFA with monophasic distal FSA and popliteal artery. Monophasic tibial waveforms. On the left his ABI is 0.58 monophasic waveforms at the left ankle. Duplex of the left lower extremity demonstrated atherosclerotic change with monophasic waveforms throughout the left lower extremity. There was some concern about left iliac disease and potentially femoral-popliteal and tibial disease. The patient does not describe claudication although according to his wife he over emphasizes his activity. Patient states he is limited by pain in both knees His wounds are on the left lateral calf. 2 small areas with necrotic debris. We have been using silver collagen and light Ace wraps 05/31/2018; wounds on the left lateral calf in the setting of very significant lymphedema and probably PAD. We have been using silver collagen. The base of the small wound looks reasonably improved. I sent him to see Dr. Fletcher Anon however I see now he has an appointment with Dr. Alvester Chou on February 12 2/5; left lateral calf wound looks the same. His wife is doing a good job with her lymphedema wraps and maintaining that the edema. He most likely has PAD and is due to see Dr. Gwenlyn Found of infectious disease on February 12 2/19; left lateral calf wounds look about the same. This looks like wound secondary to chronic venous insufficiency with lymphedema however the patient has very poor arterial studies. We referred him to Dr. Gwenlyn Found. Dr. Gwenlyn Found did not feel he needed to do anything from an arterial point of view. He did not address the question about the aggressiveness of compression. After some thoughts about  this I elected to go ahead and put him in 3 layer compression which after all would be less compression then the lymphedema clinic was putting on this. I am hopeful that this should be enough to get some closure of the small wounds 2/26; left lateral calf wound letter this week. He tolerated the 3 layer compression well furthermore he is sleeping in a hospital bed which helps keep his legs up at night. The wound looks better measuring smaller especially in width 3/3; left lateral calf wound about the same size. The 3 layer compression has really helped get the edema down in his leg however. Using silver collagen 3/11; left lateral calf wound better looking wound surface but about the same size. We have been using 3 layer compression which is really helped get the edema down in the left leg. Even after Dr. Kennon Holter assessment I am reluctant to go to 4 layer JUSTINO, BOZE A. ([id number]) compression. He is tolerating 3 layer compression well. We have been using silver collagen 3/18-Patient returns for his left lateral calf wound which overall is looking better, slightly increased in size and dimensions and area, he is tolerating the 3  layer compression, has been dressed with silver collagen which we will continue 3/25 left lateral calf wound much better looking. Size is smaller. He has been using silver collagen 4/1; gradually getting smaller in size. Using silver collagen with 3 layer compression. I think the patient has some degree of PAD. He saw Dr. Gwenlyn Found in consultation, he did not specifically comment on whether he could tolerate 4 layer compression 4/8; the wound is slightly smaller in depth. We are using 3 layer compression with silver collagen. I had him seen for arterial insufficiency however his ABI was 0.57 in the left leg. Dr. Gwenlyn Found really did not seem to middle on the degree of compression he could tolerate 4/15; the wound is slightly smaller and slightly less deep. We have been using 3  layer compression with silver collagen. There is not an option for 4-layer compression here. 4/22; the patient is making decent progress on his wound on the left lateral calf however he arrives in with a superficial wound on the right anterior tibial area. Were not really sure how this happened. He had been using a stocking on the right leg. He has been using silver collagen on the wounds Amazing to me he is he appears to have compression pumps at home. I am not really sure I knew this before. In any case he has not been using them 4/29; left lateral calf wound continues to get gradually smaller. This looks like it is on its way to closure. The area on the right anterior tibial area is about the same in terms of size superficial without any depth. This was probably wrap injury He tells me that he has been using his compression pumps once a day for 1 hour without any pain 5/6; not as good today. Left lateral calf wound is actually larger and he does not have quite as good edema control. The area on the right anterior tibia is about the same still superficial. He states he is using his compression pumps once a day. We are using 3 layer compression on the left and Curlex Coban on the right 5/13; the area on the right is very close to being healed. The area on the left still has some depth but has a healthy wound surface. He has excellent edema control bilaterally. Using Hydrofera Blue 5/20-Patient returns in 1 week after being in 3 layer compression on the left, the left lateral calf ulcer is stable with some slough, the right anterior tibial ulcer is healed we have been using Kerlix Coban on that leg 5/27; Hydrofera Blue and 3 layer compression. Still some slough in the wound area and some nonviable edges around the wound. 6/3; this patient has lymphedema and chronic venous insufficiency. He had difficult to close wound on his left lateral calf. This is closed today. He has also chronic stasis  dermatitis and xerotic skin in his lower extremities. Finally he has known PAD but he is managed to tolerate the compression we reporting on him and also using his compression pumps that he already had at home twice a day 6/10; we discharge this patient last week having finally close the small area in the left lateral calf. Apparently by Friday of last week this had opened and was draining. He is therefore back in clinic. His wife who is present also was concerned about an area on the right anterior tibia."o 6/17; the patient does not have an open area on the right leg. On the left he has a small  circular area anteriorly and the same area laterally that he had last time. However both of these appear to be improved he has his Farrow wrap 30/40. It turns out that he is only using his external compression pumps once a day. I tried to get him up to twice a day today 6/24; the patient comes in with his external compression garment on the right leg/Farrow wrap. As far as we know no open wounds here. He has 2 areas that are smaller one on the left anterior pretibial and one on the left lateral calf which is the site of his initial wounds. I have successfully caught him into his external compression pumps twice a day Objective Constitutional Sitting or standing Blood Pressure is within target range for patient.. Pulse regular and within target range for patient.Marland Kitchen Respirations regular, non-labored and within target range.. Temperature is normal and within the target range for the patient.Marland Kitchen appears in no distress. KIMM, UNGARO ([id number]) Vitals Time Taken: 10:56 AM, Height: 71 in, Weight: 252 lbs, BMI: 35.1, Temperature: 98.6 F, Pulse: 75 bpm, Respiratory Rate: 20 breaths/min, Blood Pressure: 142/63 mmHg. Ears, Nose, Mouth, and Throat Patient is hard of hearing.Marland Kitchen Respiratory Respiratory effort is easy and symmetric bilaterally. Rate is normal at rest and on room air.. Cardiovascular I can feel  a reduced dorsalis pedis pulse on the left. With very good edema control, chronic lymphedema and stasis dermatitis.. General Notes: Wound exam; he has a small area on the left anterior pre-tibia and left lateral calf. Both of these appear to have come in there is superficial without any depth. No debridement was necessary. Integumentary (Hair, Skin) Lymphedema and stasis dermatitis. Wound #1 status is Open. Original cause of wound was Gradually Appeared. The wound is located on the Left,Lateral Lower Leg. The wound measures 0.4cm length x 0.3cm width x 0.1cm depth; 0.094cm^2 area and 0.009cm^3 volume. There is Fat Layer (Subcutaneous Tissue) Exposed exposed. There is no tunneling or undermining noted. There is a medium amount of serous drainage noted. The wound margin is flat and intact. There is large (67-100%) red granulation within the wound bed. There is no necrotic tissue within the wound bed. Wound #3 status is Open. Original cause of wound was Blister. The wound is located on the Left,Medial Lower Leg. The wound measures 0.7cm length x 0.6cm width x 0.1cm depth; 0.33cm^2 area and 0.033cm^3 volume. There is Fat Layer (Subcutaneous Tissue) Exposed exposed. There is no tunneling or undermining noted. There is a medium amount of serous drainage noted. The wound margin is flat and intact. There is medium (34-66%) red granulation within the wound bed. There is a medium (34-66%) amount of necrotic tissue within the wound bed including Adherent Slough. 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 Plan Wound Cleansing: Wound #1 Left,Lateral Lower Leg: Dial antibacterial soap, wash wounds, rinse and pat dry prior to dressing wounds Wound #3  Left,Medial Lower Leg: BREEZE, ANGELL ([id number]) Dial antibacterial soap, wash wounds, rinse and pat dry prior to dressing wounds Primary Wound Dressing: Wound #1 Left,Lateral Lower Leg: Silver Alginate Wound #3 Left,Medial Lower Leg: Silver Alginate Secondary Dressing: Wound #1 Left,Lateral Lower Leg: ABD pad Wound #3 Left,Medial Lower Leg: ABD pad Dressing Change Frequency: Wound #1 Left,Lateral Lower Leg: Change dressing every week - In office Wound #3 Left,Medial  Lower Leg: Change dressing every week - In office Follow-up Appointments: Wound #1 Left,Lateral Lower Leg: Return Appointment in 1 week. Wound #3 Left,Medial Lower Leg: Return Appointment in 1 week. Edema Control: Wound #1 Left,Lateral Lower Leg: 3 Layer Compression System - Left Lower Extremity Patient to wear own Velcro compression garment. - Right Wound #3 Left,Medial Lower Leg: 3 Layer Compression System - Left Lower Extremity Patient to wear own Velcro compression garment. - Right Compression Pump: Use compression pump on left lower extremity for 60 minutes, twice daily. Compression Pump: Use compression pump on right lower extremity for 60 minutes, twice daily. 1. We use silver alginate to the left lateral lower leg/ABDs and 3 layer compression which he appears to tolerate well in the spite of his PAD 2. I have got him up to the compression pump usage twice a day which he seems to be tolerating as well. His edema control appears to be excellent. I am not thinking if we can maintain this that he will need to go to the lymphedema clinic 3. He is putting his own external compression garment on the right leg got like to have a look at that leg next week Electronic Signature(s) Signed: 10/25/2018 4:13:17 PM By: Linton Ham MD Entered By: Linton Ham on 10/25/2018 11:28:16 [name] ([id number]) -------------------------------------------------------------------------------- SuperBill  Details Patient Name: [name] Date of Service: 10/25/2018 Medical Record Number: [id number] Patient Account Number: [id number] Date of Birth/Sex: August 16, 1930 (85 y.o. M) Treating RN: Cornell Barman Primary Care Provider: Fulton Reek Other Clinician: Referring Provider: Fulton Reek Treating Provider/Extender: Tito Dine in Treatment: 23 Diagnosis Coding ICD-10 Codes Code Description [phone] Non-pressure chronic ulcer of left calf with other specified severity I89.0 Lymphedema, not elsewhere classified I70.212 Atherosclerosis of native arteries of extremities with intermittent claudication, left leg L97.211 Non-pressure chronic ulcer of right calf limited to breakdown of skin Facility Procedures CPT4 Code: [id number] Description: (Facility Use Only) [facility] LWR LT LEG Modifier: Quantity: 1 Physician Procedures CPT4 Code: [id number] Description: 59935 - WC PHYS LEVEL 3 - EST PT ICD-10 Diagnosis Description L97.228 Non-pressure chronic ulcer of left calf with other specified se I89.0 Lymphedema, not elsewhere classified Modifier: verity Quantity: 1 Electronic Signature(s) Signed: 10/25/2018 4:13:17 PM By: Linton Ham MD Entered By: Linton Ham on 10/25/2018 11:28:47

## 2018-10-28 NOTE — Progress Notes (Signed)
OMA, ALPERT ([id number]) Visit Report for 10/25/2018 Arrival Information Details Patient Name: Steven Meza, Steven Meza Date of Service: 10/25/2018 11:00 AM Medical Record Number: [id number] Patient Account Number: [id number] Date of Birth/Sex: Mar 05, 1931 (85 y.o. M) Treating RN: Montey Hora Primary Care Stephany Poorman: Fulton Reek Other Clinician: Referring Jera Headings: Fulton Reek Treating Karole Oo/Extender: Tito Dine in Treatment: 23 Visit Information History Since Last Visit Added or deleted any medications: No Patient Arrived: Cane Any new allergies or adverse reactions: No Arrival Time: 10:53 Had a fall or experienced change in No Accompanied By: self activities of daily living that may affect Transfer Assistance: None risk of falls: Patient Identification Verified: Yes Signs or symptoms of abuse/neglect since last visito No Secondary Verification Process Completed: Yes Hospitalized since last visit: No Implantable device outside of the clinic excluding No cellular tissue based products placed in the center since last visit: Has Dressing in Place as Prescribed: Yes Has Compression in Place as Prescribed: Yes Pain Present Now: No Electronic Signature(s) Signed: 10/26/2018 5:10:09 PM By: Montey Hora Entered By: Montey Hora on 10/25/2018 10:54:05 Steven Meza ([id number]) -------------------------------------------------------------------------------- Encounter Discharge Information Details Patient Name: Steven Meza Date of Service: 10/25/2018 11:00 AM Medical Record Number: [id number] Patient Account Number: [id number] Date of Birth/Sex: 1931/01/11 (85 y.o. M) Treating RN: Army Melia Primary Care Jayin Derousse: Fulton Reek Other Clinician: Referring Lem Peary: Fulton Reek Treating Conor Filsaime/Extender: Tito Dine in Treatment: 28 Encounter Discharge Information Items Discharge Condition: Stable Ambulatory Status: Cane Discharge  Destination: Home Transportation: Private Auto Accompanied By: self Schedule Follow-up Appointment: Yes Clinical Summary of Care: Electronic Signature(s) Signed: 10/25/2018 3:49:41 PM By: Army Melia Entered By: Army Melia on 10/25/2018 11:29:00 Steven Meza ([id number]) -------------------------------------------------------------------------------- Lower Extremity Assessment Details Patient Name: Steven Meza Date of Service: 10/25/2018 11:00 AM Medical Record Number: [id number] Patient Account Number: [id number] Date of Birth/Sex: Aug 28, 1930 (85 y.o. M) Treating RN: Montey Hora Primary Care Oneal Biglow: Fulton Reek Other Clinician: Referring Shermika Balthaser: Fulton Reek Treating Johnesha Acheampong/Extender: Tito Dine in Treatment: 23 Edema Assessment Assessed: [Left: No] [Right: No] Edema: [Left: Ye] [Right: s] Calf Left: Right: Point of Measurement: 34 cm From Medial Instep 44.5 cm cm Ankle Left: Right: Point of Measurement: 12 cm From Medial Instep 28.5 cm cm Vascular Assessment Pulses: Dorsalis Pedis Palpable: [Left:Yes] Electronic Signature(s) Signed: 10/26/2018 5:10:09 PM By: Montey Hora Entered By: Montey Hora on 10/25/2018 11:00:24 Steven Meza ([id number]) -------------------------------------------------------------------------------- Multi Wound Chart Details Patient Name: Steven Meza Date of Service: 10/25/2018 11:00 AM Medical Record Number: [id number] Patient Account Number: [id number] Date of Birth/Sex: 07-13-1930 (85 y.o. M) Treating RN: Cornell Barman Primary Care Kamani Lewter: Fulton Reek Other Clinician: Referring [place] Paone: Fulton Reek Treating Eliyahu Bille/Extender: Tito Dine in Treatment: 23 Vital Signs Height(in): 71 Pulse(bpm): 75 Weight(lbs): 252 Blood Pressure(mmHg): 142/63 Body Mass Index(BMI): 35 Temperature(F): 98.6 Respiratory Rate 20 (breaths/min): Photos: [N/A:N/A] Wound Location: Left Lower  Leg - Lateral Left Lower Leg - Medial N/A Wounding Event: Gradually Appeared Blister N/A Primary Etiology: Lymphedema Venous Leg Ulcer N/A Comorbid History: Lymphedema, Coronary Artery Lymphedema, Coronary Artery N/A Disease, Hypertension Disease, Hypertension Date Acquired: 11/14/2017 10/10/2018 N/A Weeks of Treatment: 23 2 N/A Wound Status: Open Open N/A Clustered Wound: Yes No N/A Measurements L x W x D 0.4x0.3x0.1 0.7x0.6x0.1 N/A (cm) Area (cm) : 0.094 0.33 N/A Volume (cm) : 0.009 0.033 N/A % Reduction in Area: 97.00% 67.70% N/A % Reduction in Volume: 97.10% 67.60% N/A Classification: Partial Thickness Partial Thickness N/A Exudate Amount: Medium Medium  N/A Exudate Type: Serous Serous N/A Exudate Color: amber amber N/A Wound Margin: Flat and Intact Flat and Intact N/A Granulation Amount: Large (67-100%) Medium (34-66%) N/A Granulation Quality: Red Red N/A Necrotic Amount: None Present (0%) Medium (34-66%) N/A Exposed Structures: Fat Layer (Subcutaneous Fat Layer (Subcutaneous N/A Tissue) Exposed: Yes Tissue) Exposed: Yes Fascia: No Fascia: No Tendon: No Tendon: No Muscle: No Muscle: No Steven Meza, Steven Meza ([id number]) Joint: No Joint: No Bone: No Bone: No Epithelialization: Small (1-33%) Medium (34-66%) N/A Treatment Notes Electronic Signature(s) Signed: 10/25/2018 4:13:17 PM By: Linton Ham MD Entered By: Linton Ham on 10/25/2018 11:20:54 Steven Meza ([id number]) -------------------------------------------------------------------------------- Multi-Disciplinary Care Plan Details Patient Name: Steven Meza Date of Service: 10/25/2018 11:00 AM Medical Record Number: [id number] Patient Account Number: [id number] Date of Birth/Sex: Sep 17, 1930 (85 y.o. M) Treating RN: Cornell Barman Primary Care Quandarius Nill: Fulton Reek Other Clinician: Referring Imunique Samad: Fulton Reek Treating Fathima Bartl/Extender: Tito Dine in Treatment: 55 Active  Inactive Orientation to the Wound Care Program Nursing Diagnoses: Knowledge deficit related to the wound healing center program Goals: Patient/caregiver will verbalize understanding of the [place] Date Initiated: 05/17/2018 Target Resolution Date: 06/17/2018 Goal Status: Active Interventions: Provide education on orientation to the wound center Notes: Venous Leg Ulcer Nursing Diagnoses: Potential for venous Insuffiency (use before diagnosis confirmed) Goals: Patient/caregiver will verbalize understanding of disease process and disease management Date Initiated: 05/17/2018 Target Resolution Date: 06/17/2018 Goal Status: Active Interventions: Assess peripheral edema status every visit. Treatment Activities: Non-invasive vascular studies : 05/17/2018 Notes: Wound/Skin Impairment Nursing Diagnoses: Impaired tissue integrity Goals: Patient/caregiver will verbalize understanding of skin care regimen Date Initiated: 05/17/2018 Target Resolution Date: 05/17/2018 Goal Status: Active Steven Meza, Steven Meza ([id number]) Ulcer/skin breakdown will heal within 14 weeks Date Initiated: 05/17/2018 Target Resolution Date: 08/16/2018 Goal Status: Active Interventions: Assess ulceration(s) every visit Treatment Activities: Referred to DME Sandford Diop for dressing supplies : 05/17/2018 Topical wound management initiated : 05/17/2018 Notes: Electronic Signature(s) Signed: 10/27/2018 4:38:11 PM By: Gretta Cool, BSN, RN, CWS, Kim RN, BSN Entered By: Gretta Cool, BSN, RN, CWS, Kim on 10/25/2018 11:14:16 Steven Meza ([id number]) -------------------------------------------------------------------------------- Pain Assessment Details Patient Name: Steven Meza Date of Service: 10/25/2018 11:00 AM Medical Record Number: [id number] Patient Account Number: [id number] Date of Birth/Sex: 10-Feb-1931 (85 y.o. M) Treating RN: Montey Hora Primary Care Ashlee Player: Fulton Reek Other  Clinician: Referring Conita Amenta: Fulton Reek Treating Saragrace Selke/Extender: Tito Dine in Treatment: 23 Active Problems Location of Pain Severity and Description of Pain Patient Has Paino No Site Locations Pain Management and Medication Current Pain Management: Electronic Signature(s) Signed: 10/26/2018 5:10:09 PM By: Montey Hora Entered By: Montey Hora on 10/25/2018 10:56:38 Steven Meza ([id number]) -------------------------------------------------------------------------------- Patient/Caregiver Education Details Patient Name: Steven Meza Date of Service: 10/25/2018 11:00 AM Medical Record Number: [id number] Patient Account Number: [id number] Date of Birth/Gender: 01-Oct-1930 (85 y.o. M) Treating RN: Cornell Barman Primary Care Physician: Fulton Reek Other Clinician: Referring Physician: Fulton Reek Treating Physician/Extender: Tito Dine in Treatment: 23 Education Assessment Education Provided To: Patient Education Topics Provided Venous: Handouts: Controlling Swelling with Multilayered Compression Wraps Methods: Demonstration, Explain/Verbal Responses: State content correctly Electronic Signature(s) Signed: 10/27/2018 4:38:11 PM By: Gretta Cool, BSN, RN, CWS, Kim RN, BSN Entered By: Gretta Cool, BSN, RN, CWS, Kim on 10/25/2018 11:28:40 Steven Meza ([id number]) -------------------------------------------------------------------------------- Wound Assessment Details Patient Name: Steven Meza Date of Service: 10/25/2018 11:00 AM Medical Record Number: [id number] Patient Account Number: [id number] Date of Birth/Sex: Nov 21, 1930 (85 y.o. M) Treating RN: Montey Hora Primary  Care Princes Finger: Fulton Reek Other Clinician: Referring Iliany Losier: Fulton Reek Treating Nyliah Nierenberg/Extender: Tito Dine in Treatment: 23 Wound Status Wound Number: 1 Primary Lymphedema Etiology: Wound Location: Left Lower Leg - Lateral Wound  Status: Open Wounding Event: Gradually Appeared Comorbid Lymphedema, Coronary Artery Disease, Date Acquired: 11/14/2017 History: Hypertension Weeks Of Treatment: 23 Clustered Wound: Yes Photos Wound Measurements Length: (cm) 0.4 Width: (cm) 0.3 Depth: (cm) 0.1 Area: (cm) 0.094 Volume: (cm) 0.009 % Reduction in Area: 97% % Reduction in Volume: 97.1% Epithelialization: Small (1-33%) Tunneling: No Undermining: No Wound Description Classification: Partial Thickness Foul Odor A Wound Margin: Flat and Intact Slough/Fibr Exudate Amount: Medium Exudate Type: Serous Exudate Color: amber fter Cleansing: No ino Yes Wound Bed Granulation Amount: Large (67-100%) Exposed Structure Granulation Quality: Red Fascia Exposed: No Necrotic Amount: None Present (0%) Fat Layer (Subcutaneous Tissue) Exposed: Yes Tendon Exposed: No Muscle Exposed: No Joint Exposed: No Bone Exposed: No Treatment Notes Steven Meza, Steven Meza ([id number]) Wound #1 (Left, Lateral Lower Leg) Notes silver cell, ABD, 3 layer Electronic Signature(s) Signed: 10/26/2018 5:10:09 PM By: Montey Hora Entered By: Montey Hora on 10/25/2018 11:06:33 Steven Meza ([id number]) -------------------------------------------------------------------------------- Wound Assessment Details Patient Name: Steven Meza Date of Service: 10/25/2018 11:00 AM Medical Record Number: [id number] Patient Account Number: [id number] Date of Birth/Sex: 1931-01-27 (85 y.o. M) Treating RN: Montey Hora Primary Care Jaxten Brosh: Fulton Reek Other Clinician: Referring Elan Brainerd: Fulton Reek Treating Terriana Barreras/Extender: Tito Dine in Treatment: 23 Wound Status Wound Number: 3 Primary Venous Leg Ulcer Etiology: Wound Location: Left Lower Leg - Medial Wound Status: Open Wounding Event: Blister Comorbid Lymphedema, Coronary Artery Disease, Date Acquired: 10/10/2018 History: Hypertension Weeks Of Treatment: 2 Clustered  Wound: No Photos Wound Measurements Length: (cm) 0.7 % Reduction i Width: (cm) 0.6 % Reduction i Depth: (cm) 0.1 Epithelializa Area: (cm) 0.33 Tunneling: Volume: (cm) 0.033 Undermining: n Area: 67.7% n Volume: 67.6% tion: Medium (34-66%) No No Wound Description Classification: Partial Thickness Foul Odor Aft Wound Margin: Flat and Intact Slough/Fibrin Exudate Amount: Medium Exudate Type: Serous Exudate Color: amber er Cleansing: No o No Wound Bed Granulation Amount: Medium (34-66%) Exposed Structure Granulation Quality: Red Fascia Exposed: No Necrotic Amount: Medium (34-66%) Fat Layer (Subcutaneous Tissue) Exposed: Yes Necrotic Quality: Adherent Slough Tendon Exposed: No Muscle Exposed: No Joint Exposed: No Bone Exposed: No Treatment Notes Steven Meza, Steven Meza ([id number]) Wound #3 (Left, Medial Lower Leg) Notes silver cell, ABD, 3 layer Electronic Signature(s) Signed: 10/26/2018 5:10:09 PM By: Montey Hora Entered By: Montey Hora on 10/25/2018 11:07:04 Steven Meza ([id number]) -------------------------------------------------------------------------------- Vitals Details Patient Name: Steven Meza Date of Service: 10/25/2018 11:00 AM Medical Record Number: [id number] Patient Account Number: [id number] Date of Birth/Sex: [birth year] (85 y.o. M) Treating RN: Montey Hora Primary Care Stephen Baruch: Fulton Reek Other Clinician: Referring Kaiyah Eber: Fulton Reek Treating Shakti Fleer/Extender: Tito Dine in Treatment: 23 Vital Signs Time Taken: 10:56 Temperature (F): 98.6 Height (in): 71 Pulse (bpm): 75 Weight (lbs): 252 Respiratory Rate (breaths/min): 20 Body Mass Index (BMI): 35.1 Blood Pressure (mmHg): 142/63 Reference Range: 80 - 120 mg / dl Electronic Signature(s) Signed: 10/26/2018 5:10:09 PM By: Montey Hora Entered By: Montey Hora on 10/25/2018 10:59:30

## 2018-11-01 ENCOUNTER — Other Ambulatory Visit: Payer: Self-pay

## 2018-11-01 ENCOUNTER — Encounter: Payer: Medicare Other | Attending: Internal Medicine | Admitting: Internal Medicine

## 2018-11-01 DIAGNOSIS — I2510 Atherosclerotic heart disease of native coronary artery without angina pectoris: Secondary | ICD-10-CM | POA: Diagnosis not present

## 2018-11-01 DIAGNOSIS — L97211 Non-pressure chronic ulcer of right calf limited to breakdown of skin: Secondary | ICD-10-CM | POA: Insufficient documentation

## 2018-11-01 DIAGNOSIS — Z6835 Body mass index (BMI) 35.0-35.9, adult: Secondary | ICD-10-CM | POA: Diagnosis not present

## 2018-11-01 DIAGNOSIS — I872 Venous insufficiency (chronic) (peripheral): Secondary | ICD-10-CM | POA: Diagnosis not present

## 2018-11-01 DIAGNOSIS — I10 Essential (primary) hypertension: Secondary | ICD-10-CM | POA: Insufficient documentation

## 2018-11-01 DIAGNOSIS — I890 Lymphedema, not elsewhere classified: Secondary | ICD-10-CM | POA: Diagnosis not present

## 2018-11-01 DIAGNOSIS — M1990 Unspecified osteoarthritis, unspecified site: Secondary | ICD-10-CM | POA: Insufficient documentation

## 2018-11-01 DIAGNOSIS — Z8551 Personal history of malignant neoplasm of bladder: Secondary | ICD-10-CM | POA: Insufficient documentation

## 2018-11-01 DIAGNOSIS — I70212 Atherosclerosis of native arteries of extremities with intermittent claudication, left leg: Secondary | ICD-10-CM | POA: Diagnosis not present

## 2018-11-07 NOTE — Progress Notes (Signed)
LYNDEN, FLEMMER ([id number]) Visit Report for 11/01/2018 Arrival Information Details Patient Name: Steven Meza, Steven Meza Date of Service: 11/01/2018 2:45 PM Medical Record Number: [id number] Patient Account Number: [id number] Date of Birth/Sex: [birth year] (85 y.o. M) Treating RN: Harold Barban Primary Care Shanelle Clontz: Fulton Reek Other Clinician: Referring Cylie Dor: Fulton Reek Treating Keidra Withers/Extender: Tito Dine in Treatment: 24 Visit Information History Since Last Visit Added or deleted any medications: No Patient Arrived: Cane Any new allergies or adverse reactions: No Arrival Time: 14:40 Had a fall or experienced change in No Accompanied By: self activities of daily living that may affect Transfer Assistance: None risk of falls: Patient Identification Verified: Yes Signs or symptoms of abuse/neglect since last visito No Secondary Verification Process Completed: Yes Hospitalized since last visit: No Has Dressing in Place as Prescribed: Yes Has Compression in Place as Prescribed: Yes Pain Present Now: No Electronic Signature(s) Signed: 11/02/2018 9:59:53 AM By: Harold Barban Entered By: Harold Barban on 11/01/2018 14:41:06 Steven Meza ([id number]) -------------------------------------------------------------------------------- Lower Extremity Assessment Details Patient Name: Steven Meza Date of Service: 11/01/2018 2:45 PM Medical Record Number: [id number] Patient Account Number: [id number] Date of Birth/Sex: [birth year] (85 y.o. M) Treating RN: Harold Barban Primary Care Canden Cieslinski: Fulton Reek Other Clinician: Referring Daveda Larock: Fulton Reek Treating Markon Jares/Extender: Tito Dine in Treatment: 24 Edema Assessment Assessed: [Left: No] [Right: No] [Left: Edema] [Right: :] Calf Left: Right: Point of Measurement: 34 cm From Medial Instep 45.5 cm cm Ankle Left: Right: Point of Measurement: 12 cm From Medial Instep 27.8 cm  cm Vascular Assessment Pulses: Dorsalis Pedis Palpable: [Left:Yes] Posterior Tibial Palpable: [Left:Yes] Electronic Signature(s) Signed: 11/02/2018 9:59:53 AM By: Harold Barban Entered By: Harold Barban on 11/01/2018 14:56:33 Steven Meza ([id number]) -------------------------------------------------------------------------------- Multi Wound Chart Details Patient Name: Steven Meza Date of Service: 11/01/2018 2:45 PM Medical Record Number: [id number] Patient Account Number: [id number] Date of Birth/Sex: 07/17/1930 (85 y.o. M) Treating RN: Cornell Barman Primary Care Carissa Musick: Fulton Reek Other Clinician: Referring Adalene Gulotta: Fulton Reek Treating Shandiin Eisenbeis/Extender: Tito Dine in Treatment: 24 Vital Signs Height(in): 71 Pulse(bpm): 72 Weight(lbs): 252 Blood Pressure(mmHg): 146/56 Body Mass Index(BMI): 35 Temperature(F): 98.3 Respiratory Rate 20 (breaths/min): Photos: [N/A:N/A] Wound Location: Left, Lateral Lower Leg Left Lower Leg - Medial N/A Wounding Event: Gradually Appeared Blister N/A Primary Etiology: Lymphedema Venous Leg Ulcer N/A Comorbid History: Lymphedema, Coronary Artery Lymphedema, Coronary Artery N/A Disease, Hypertension Disease, Hypertension Date Acquired: 11/14/2017 10/10/2018 N/A Weeks of Treatment: 24 3 N/A Wound Status: Healed - Epithelialized Open N/A Clustered Wound: Yes No N/A Measurements L x W x D 0x0x0 1x1.5x0.1 N/A (cm) Area (cm) : 0 1.178 N/A Volume (cm) : 0 0.118 N/A % Reduction in Area: 100.00% -15.40% N/A % Reduction in Volume: 100.00% -15.70% N/A Classification: Partial Thickness Partial Thickness N/A Exudate Amount: Medium Medium N/A Exudate Type: Serous Serous N/A Exudate Color: amber amber N/A Wound Margin: Flat and Intact Flat and Intact N/A Granulation Amount: Large (67-100%) Medium (34-66%) N/A Granulation Quality: Red Red N/A Necrotic Amount: None Present (0%) Medium (34-66%) N/A Exposed  Structures: Fat Layer (Subcutaneous Fat Layer (Subcutaneous N/A Tissue) Exposed: Yes Tissue) Exposed: Yes Fascia: No Fascia: No Tendon: No Tendon: No Muscle: No Muscle: No Steven Meza, Steven Meza ([id number]) Joint: No Joint: No Bone: No Bone: No Epithelialization: Small (1-33%) Medium (34-66%) N/A Treatment Notes Electronic Signature(s) Signed: 11/01/2018 6:06:19 PM By: Linton Ham MD Entered By: Linton Ham on 11/01/2018 17:56:02 Steven Meza ([id number]) -------------------------------------------------------------------------------- Multi-Disciplinary Care Plan Details Patient Name: Steven Meza, Steven  A. Date of Service: 11/01/2018 2:45 PM Medical Record Number: [id number] Patient Account Number: [id number] Date of Birth/Sex: [birth year] (85 y.o. M) Treating RN: Cornell Barman Primary Care Vivian Okelley: Fulton Reek Other Clinician: Referring Keymoni Mccaster: Fulton Reek Treating Joscelynn Brutus/Extender: Tito Dine in Treatment: 74 Active Inactive Orientation to the Wound Care Program Nursing Diagnoses: Knowledge deficit related to the wound healing center program Goals: Patient/caregiver will verbalize understanding of the [facility] Date Initiated: 05/17/2018 Target Resolution Date: 06/17/2018 Goal Status: Active Interventions: Provide education on orientation to the wound center Notes: Venous Leg Ulcer Nursing Diagnoses: Potential for venous Insuffiency (use before diagnosis confirmed) Goals: Patient/caregiver will verbalize understanding of disease process and disease management Date Initiated: 05/17/2018 Target Resolution Date: 06/17/2018 Goal Status: Active Interventions: Assess peripheral edema status every visit. Treatment Activities: Non-invasive vascular studies : 05/17/2018 Notes: Wound/Skin Impairment Nursing Diagnoses: Impaired tissue integrity Goals: Patient/caregiver will verbalize understanding of skin care regimen Date Initiated:  05/17/2018 Target Resolution Date: 05/17/2018 Goal Status: Active Steven Meza, Steven Meza ([id number]) Ulcer/skin breakdown will heal within 14 weeks Date Initiated: 05/17/2018 Target Resolution Date: 08/16/2018 Goal Status: Active Interventions: Assess ulceration(s) every visit Treatment Activities: Referred to DME Tarnisha Kachmar for dressing supplies : 05/17/2018 Topical wound management initiated : 05/17/2018 Notes: Electronic Signature(s) Signed: 11/06/2018 5:43:16 PM By: Gretta Cool, BSN, RN, CWS, Kim RN, BSN Entered By: Gretta Cool, BSN, RN, CWS, Kim on 11/01/2018 15:31:03 Steven Meza ([id number]) -------------------------------------------------------------------------------- Pain Assessment Details Patient Name: Steven Meza Date of Service: 11/01/2018 2:45 PM Medical Record Number: [id number] Patient Account Number: [id number] Date of Birth/Sex: May 26, 1930 (85 y.o. M) Treating RN: Harold Barban Primary Care Kaedence Connelly: Fulton Reek Other Clinician: Referring Jaciel Diem: Fulton Reek Treating Charonda Hefter/Extender: Tito Dine in Treatment: 24 Active Problems Location of Pain Severity and Description of Pain Patient Has Paino No Site Locations Pain Management and Medication Current Pain Management: Electronic Signature(s) Signed: 11/02/2018 9:59:53 AM By: Harold Barban Entered By: Harold Barban on 11/01/2018 14:46:54 Steven Meza ([id number]) -------------------------------------------------------------------------------- Patient/Caregiver Education Details Patient Name: Steven Meza Date of Service: 11/01/2018 2:45 PM Medical Record Number: [id number] Patient Account Number: [id number] Date of Birth/Gender: 1930-06-04 (85 y.o. M) Treating RN: Cornell Barman Primary Care Physician: Fulton Reek Other Clinician: Referring Physician: Fulton Reek Treating Physician/Extender: Tito Dine in Treatment: 65 Education Assessment Education Provided  To: Patient Education Topics Provided Venous: Handouts: Controlling Swelling with Multilayered Compression Wraps Methods: Demonstration, Explain/Verbal Responses: State content correctly Electronic Signature(s) Signed: 11/06/2018 5:43:16 PM By: Gretta Cool, BSN, RN, CWS, Kim RN, BSN Entered By: Gretta Cool, BSN, RN, CWS, Kim on 11/01/2018 15:32:23 Steven Meza ([id number]) -------------------------------------------------------------------------------- Wound Assessment Details Patient Name: Steven Meza Date of Service: 11/01/2018 2:45 PM Medical Record Number: [id number] Patient Account Number: [id number] Date of Birth/Sex: 1930/06/09 (85 y.o. M) Treating RN: Cornell Barman Primary Care Maziyah Vessel: Fulton Reek Other Clinician: Referring Ailie Gage: Fulton Reek Treating Chaley Castellanos/Extender: Tito Dine in Treatment: 24 Wound Status Wound Number: 1 Primary Lymphedema Etiology: Wound Location: Left, Lateral Lower Leg Wound Status: Healed - Epithelialized Wounding Event: Gradually Appeared Comorbid Lymphedema, Coronary Artery Disease, Date Acquired: 11/14/2017 History: Hypertension Weeks Of Treatment: 24 Clustered Wound: Yes Photos Wound Measurements Length: (cm) 0 % Reduct Width: (cm) 0 % Reduct Depth: (cm) 0 Epitheli Area: (cm) 0 Tunneli Volume: (cm) 0 Undermi ion in Area: 100% ion in Volume: 100% alization: Small (1-33%) ng: No ning: No Wound Description Classification: Partial Thickness Wound Margin: Flat and Intact Exudate Amount: Medium Exudate Type: Serous Exudate Color:  amber Foul Odor After Cleansing: No Slough/Fibrino Yes Wound Bed Granulation Amount: Large (67-100%) Exposed Structure Granulation Quality: Red Fascia Exposed: No Necrotic Amount: None Present (0%) Fat Layer (Subcutaneous Tissue) Exposed: Yes Tendon Exposed: No Muscle Exposed: No Joint Exposed: No Bone Exposed: No Electronic Signature(s) Steven Meza, Steven Meza ([id number]) Signed:  11/06/2018 5:43:16 PM By: Gretta Cool, BSN, RN, CWS, Kim RN, BSN Entered By: Gretta Cool, BSN, RN, CWS, Kim on 11/01/2018 15:30:43 Steven Meza ([id number]) -------------------------------------------------------------------------------- Wound Assessment Details Patient Name: Steven Meza Date of Service: 11/01/2018 2:45 PM Medical Record Number: [id number] Patient Account Number: [id number] Date of Birth/Sex: [birth year] (85 y.o. M) Treating RN: Harold Barban Primary Care Sharron Simpson: Fulton Reek Other Clinician: Referring Galena Logie: Fulton Reek Treating Aquila Menzie/Extender: Tito Dine in Treatment: 24 Wound Status Wound Number: 3 Primary Venous Leg Ulcer Etiology: Wound Location: Left Lower Leg - Medial Wound Status: Open Wounding Event: Blister Comorbid Lymphedema, Coronary Artery Disease, Date Acquired: 10/10/2018 History: Hypertension Weeks Of Treatment: 3 Clustered Wound: No Photos Wound Measurements Length: (cm) 1 % Reduction i Width: (cm) 1.5 % Reduction i Depth: (cm) 0.1 Epithelializa Area: (cm) 1.178 Tunneling: Volume: (cm) 0.118 Undermining: n Area: -15.4% n Volume: -15.7% tion: Medium (34-66%) No No Wound Description Classification: Partial Thickness Foul Odor Aft Wound Margin: Flat and Intact Slough/Fibrin Exudate Amount: Medium Exudate Type: Serous Exudate Color: amber er Cleansing: No o No Wound Bed Granulation Amount: Medium (34-66%) Exposed Structure Granulation Quality: Red Fascia Exposed: No Necrotic Amount: Medium (34-66%) Fat Layer (Subcutaneous Tissue) Exposed: Yes Necrotic Quality: Adherent Slough Tendon Exposed: No Muscle Exposed: No Joint Exposed: No Bone Exposed: No Electronic Signature(s) Steven Meza, Steven Meza ([id number]) Signed: 11/02/2018 9:59:53 AM By: Harold Barban Entered By: Harold Barban on 11/01/2018 14:55:07 Steven Meza  ([id number]) -------------------------------------------------------------------------------- Vitals Details Patient Name: Steven Meza Date of Service: 11/01/2018 2:45 PM Medical Record Number: [id number] Patient Account Number: [id number] Date of Birth/Sex: [birth year] (85 y.o. M) Treating RN: Harold Barban Primary Care Lasharon Dunivan: Fulton Reek Other Clinician: Referring Tichina Koebel: Fulton Reek Treating Jatia Musa/Extender: Tito Dine in Treatment: 24 Vital Signs Time Taken: 14:45 Temperature (F): 98.3 Height (in): 71 Pulse (bpm): 72 Weight (lbs): 252 Respiratory Rate (breaths/min): 20 Body Mass Index (BMI): 35.1 Blood Pressure (mmHg): 146/56 Reference Range: 80 - 120 mg / dl Electronic Signature(s) Signed: 11/02/2018 9:59:53 AM By: Harold Barban Entered By: Harold Barban on 11/01/2018 14:47:14

## 2018-11-07 NOTE — Progress Notes (Signed)
DODD, SCHMID ([id number]) Visit Report for 11/01/2018 HPI Details Patient Name: Steven Meza, Steven Meza Date of Service: 11/01/2018 2:45 PM Medical Record Number: [id number] Patient Account Number: [id number] Date of Birth/Sex: 1931/03/27 (85 y.o. M) Treating RN: Cornell Barman Primary Care Provider: Fulton Reek Other Clinician: Referring Provider: Fulton Reek Treating Provider/Extender: Tito Dine in Treatment: 24 History of Present Illness HPI Description: ADMISSION 05/17/2018 Mr. Bostelman is an 85 year old man who is been treated at the lymphedema clinic for a long period with lymphedema wraps for bilateral lower extremity lymphedema. About 6 months ago they noted small open areas on his left lateral calf just above the ankle. These were open. They are apparently putting some form of ointment on this. They have been referred here for our review of this. The patient has a long history of lymphedema and venous stasis. It says in his records that he has recurrent cellulitis although his wife denies this but she states that the lymphedema may have started with cellulitis several years ago. Also in his record there is a history of bladder cancer which they seem to know very little about however he did not have any radiation to the pelvis or anything that could have contributed to lymphedema that they are aware of. There is no prior wound history. The patient has 2 small punched out areas on the left lateral calf that have adherent debris on the surface. Past medical history; lymphedema, hypertension, cellulitis, hearing loss, morbid obesity, osteoarthritis, venous stasis, bladder CA, diverticulosis. ABIs in our clinic were 0.36 on the right and 0.57 on the left 05/24/2018; corrected age on this patient is 33 versus what I said last week. He has been for his arterial studies which predictably are not very good. On the right his ABI is 0.65. Monophasic waveforms noted at the right ankle  including the posterior tibial and dorsalis pedis. He has triphasic waveforms of the common femoral and profunda femoris triphasic proximal SFA with monophasic distal FSA and popliteal artery. Monophasic tibial waveforms. On the left his ABI is 0.58 monophasic waveforms at the left ankle. Duplex of the left lower extremity demonstrated atherosclerotic change with monophasic waveforms throughout the left lower extremity. There was some concern about left iliac disease and potentially femoral-popliteal and tibial disease. The patient does not describe claudication although according to his wife he over emphasizes his activity. Patient states he is limited by pain in both knees His wounds are on the left lateral calf. 2 small areas with necrotic debris. We have been using silver collagen and light Ace wraps 05/31/2018; wounds on the left lateral calf in the setting of very significant lymphedema and probably PAD. We have been using silver collagen. The base of the small wound looks reasonably improved. I sent him to see Dr. Fletcher Anon however I see now he has an appointment with Dr. Alvester Chou on February 12 2/5; left lateral calf wound looks the same. His wife is doing a good job with her lymphedema wraps and maintaining that the edema. He most likely has PAD and is due to see Dr. Gwenlyn Found of infectious disease on February 12 2/19; left lateral calf wounds look about the same. This looks like wound secondary to chronic venous insufficiency with lymphedema however the patient has very poor arterial studies. We referred him to Dr. Gwenlyn Found. Dr. Gwenlyn Found did not feel he needed to do anything from an arterial point of view. He did not address the question about the aggressiveness of compression. After some thoughts about  this I elected to go ahead and put him in 3 layer compression which after all would be less compression then the lymphedema clinic was putting on this. I am hopeful that this should be enough to get  some closure of the small wounds 2/26; left lateral calf wound letter this week. He tolerated the 3 layer compression well furthermore he is sleeping in a hospital bed which helps keep his legs up at night. The wound looks better measuring smaller especially in width 3/3; left lateral calf wound about the same size. The 3 layer compression has really helped get the edema down in his leg BRYNDEN, THUNE A. ([id number]) however. Using silver collagen 3/11; left lateral calf wound better looking wound surface but about the same size. We have been using 3 layer compression which is really helped get the edema down in the left leg. Even after Dr. Kennon Holter assessment I am reluctant to go to 4 layer compression. He is tolerating 3 layer compression well. We have been using silver collagen 3/18-Patient returns for his left lateral calf wound which overall is looking better, slightly increased in size and dimensions and area, he is tolerating the 3 layer compression, has been dressed with silver collagen which we will continue 3/25oleft lateral calf wound much better looking. Size is smaller. He has been using silver collagen 4/1; gradually getting smaller in size. Using silver collagen with 3 layer compression. I think the patient has some degree of PAD. He saw Dr. Gwenlyn Found in consultation, he did not specifically comment on whether he could tolerate 4 layer compression 4/8; the wound is slightly smaller in depth. We are using 3 layer compression with silver collagen. I had him seen for arterial insufficiency however his ABI was 0.57 in the left leg. Dr. Gwenlyn Found really did not seem to middle on the degree of compression he could tolerate 4/15; the wound is slightly smaller and slightly less deep. We have been using 3 layer compression with silver collagen. There is not an option for 4-layer compression here. 4/22; the patient is making decent progress on his wound on the left lateral calf however he arrives in  with a superficial wound on the right anterior tibial area. Were not really sure how this happened. He had been using a stocking on the right leg. He has been using silver collagen on the wounds Amazing to me he is he appears to have compression pumps at home. I am not really sure I knew this before. In any case he has not been using them 4/29; left lateral calf wound continues to get gradually smaller. This looks like it is on its way to closure. oThe area on the right anterior tibial area is about the same in terms of size superficial without any depth. This was probably wrap injury oHe tells me that he has been using his compression pumps once a day for 1 hour without any pain 5/6; not as good today. Left lateral calf wound is actually larger and he does not have quite as good edema control. The area on the right anterior tibia is about the same still superficial. He states he is using his compression pumps once a day. We are using 3 layer compression on the left and Curlex Coban on the right 5/13; the area on the right is very close to being healed. The area on the left still has some depth but has a healthy wound surface. He has excellent edema control bilaterally. Using Hydrofera Blue 5/20-Patient returns  in 1 week after being in 3 layer compression on the left, the left lateral calf ulcer is stable with some slough, the right anterior tibial ulcer is healed we have been using Kerlix Coban on that leg 5/27; Hydrofera Blue and 3 layer compression. Still some slough in the wound area and some nonviable edges around the wound. 6/3; this patient has lymphedema and chronic venous insufficiency. He had difficult to close wound on his left lateral calf. This is closed today. 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 Finally he has known PAD but he is managed to tolerate the compression we reporting on him and also using his compression pumps that he already  had at home twice a day 6/10; we discharge this patient last week having finally close the small area in the left lateral calf. Apparently by Friday of last week this had opened and was draining. He is therefore back in clinic. His wife who is present also was concerned about an area on the right anterior tibia."o 6/17; the patient does not have an open area on the right leg. On the left he has a small circular area anteriorly and the same area laterally that he had last time. However both of these appear to be improved he has his Farrow wrap 30/40. It turns out that he is only using his external compression pumps once a day. I tried to get him up to twice a day today 6/24; the patient comes in with his external compression garment on the right leg/Farrow wrap. As far as we know no open wounds here. He has 2 areas that are smaller one on the left anterior pretibial and one on the left lateral calf which is the site of his initial wounds. I have successfully caught him into his external compression pumps twice a day 7/1; still using his external compression garment on the right leg. As far as we know there are no open wounds here. The area on the left lateral calf which was the site of his initial wound has closed over. The area on the left anterior pretibial that we identified last week is still open. Electronic Signature(s) Signed: 11/01/2018 6:06:19 PM By: Linton Ham MD Entered By: Linton Ham on 11/01/2018 17:56:45 [name] ([id number]) -------------------------------------------------------------------------------- Physical Exam Details Patient Name: [name] Date of Service: 11/01/2018 2:45 PM Medical Record Number: [id number] Patient Account Number: [id number] Date of Birth/Sex: [birth year] (85 y.o. M) Treating RN: Cornell Barman Primary Care Provider: Fulton Reek Other Clinician: Referring Provider: Fulton Reek Treating Provider/Extender: Tito Dine in Treatment: 24 Constitutional Sitting or standing Blood Pressure is within target range for patient.. Pulse regular and within target range for patient.Marland Kitchen Respirations regular, non-labored and within target range.. Temperature is normal and within the target range for the patient.Marland Kitchen appears in no distress. Eyes Conjunctivae clear. No discharge. Respiratory Respiratory effort is easy and symmetric bilaterally. Rate is normal at rest and on room air.. Cardiovascular Pedal pulses absent bilaterally.. Lymphatic None palpable in the popliteal area bilaterally. Integumentary (Hair, Skin) Changes of chronic venous insufficiency with lymphedema. Psychiatric No evidence of depression, anxiety, or agitation. Calm, cooperative, and communicative. Appropriate interactions and affect.. Notes Wound exam; the lateral left calf wound that was his original wound is closed over. He still has the area on the left anterior pre-tibia this is superficial. His edema control seems adequate. I cannot feel his peripheral pulses although this is not new and he  seems reasonably asymptomatic Electronic Signature(s) Signed: 11/01/2018 6:06:19 PM By: Linton Ham MD Entered By: Linton Ham on 11/01/2018 18:00:18 [name] ([id number]) -------------------------------------------------------------------------------- Physician Orders Details Patient Name: [name] Date of Service: 11/01/2018 2:45 PM Medical Record Number: [id number] Patient Account Number: [id number] Date of Birth/Sex: 1930/09/18 (85 y.o. M) Treating RN: Cornell Barman Primary Care Provider: Fulton Reek Other Clinician: Referring Provider: Fulton Reek Treating Provider/Extender: Tito Dine in Treatment: 67 Verbal / Phone Orders: No Diagnosis Coding Wound Cleansing Wound #3 Left,Medial Lower Leg o Dial antibacterial soap, wash wounds, rinse and pat dry prior to dressing wounds Primary Wound  Dressing Wound #3 Left,Medial Lower Leg o Silver Alginate Secondary Dressing Wound #3 Left,Medial Lower Leg o ABD pad Dressing Change Frequency Wound #3 Left,Medial Lower Leg o Change dressing every week - In office Follow-up Appointments Wound #3 Left,Medial Lower Leg o Return Appointment in 1 week. Edema Control Wound #3 Left,Medial Lower Leg o 3 Layer Compression System - Left Lower Extremity o Patient to wear own Velcro compression garment. - Right o Compression Pump: Use compression pump on left lower extremity for 60 minutes, twice daily. o Compression Pump: Use compression pump on right lower extremity for 60 minutes, twice daily. Electronic Signature(s) Signed: 11/01/2018 6:06:19 PM By: Linton Ham MD Signed: 11/06/2018 5:43:16 PM By: Gretta Cool, BSN, RN, CWS, Kim RN, BSN Entered By: Gretta Cool, BSN, RN, CWS, Kim on 11/01/2018 15:31:53 [name] ([id number]) -------------------------------------------------------------------------------- Problem List Details Patient Name: [name] Date of Service: 11/01/2018 2:45 PM Medical Record Number: [id number] Patient Account Number: [id number] Date of Birth/Sex: 17-Feb-1931 (85 y.o. 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 with 05/17/2018 No Yes intermittent claudication, left leg L97.211 Non-pressure chronic ulcer of right calf limited to breakdown 08/23/2018 No Yes of skin Inactive Problems Resolved Problems Electronic Signature(s) Signed: 11/01/2018 6:06:19 PM By:  Linton Ham MD Entered By: Linton Ham on 11/01/2018 17:55:54 [name] ([id number]) -------------------------------------------------------------------------------- Progress Note Details Patient Name: [name] Date of Service: 11/01/2018 2:45 PM Medical Record Number: [id number] Patient Account Number: [id number] Date of Birth/Sex: 1931-04-13 (85 y.o. M) Treating RN: Cornell Barman Primary Care Provider: Fulton Reek Other Clinician: Referring Provider: Fulton Reek Treating Provider/Extender: Tito Dine in Treatment: 24 Subjective History of Present Illness (HPI) ADMISSION 05/17/2018 Mr. Dowe is an 85 year old man who is been treated at the lymphedema clinic for a long period with lymphedema wraps for bilateral lower extremity lymphedema. About 6 months ago they noted small open areas on his left lateral calf just above the ankle. These were open. They are apparently putting some form of ointment on this. They have been referred here for our review of this. The patient has a long history of lymphedema and venous stasis. It says in his records that he has recurrent cellulitis although his wife denies this but she states that the lymphedema may have started with cellulitis several years ago. Also in his record there is a history of bladder cancer which they seem to know very little about however he did not have any radiation to the pelvis or anything that could have contributed to lymphedema that they  are aware of. There is no prior wound history. The patient has 2 small punched out areas on the left lateral calf that have adherent debris on the surface. Past medical history; lymphedema, hypertension, cellulitis, hearing loss, morbid obesity, osteoarthritis, venous stasis, bladder CA, diverticulosis. ABIs in our clinic were 0.36 on the right and 0.57 on the left 05/24/2018; corrected age on this patient is 28 versus what I said last week. He has been for  his arterial studies which predictably are not very good. On the right his ABI is 0.65. Monophasic waveforms noted at the right ankle including the posterior tibial and dorsalis pedis. He has triphasic waveforms of the common femoral and profunda femoris triphasic proximal SFA with monophasic distal FSA and popliteal artery. Monophasic tibial waveforms. On the left his ABI is 0.58 monophasic waveforms at the left ankle. Duplex of the left lower extremity demonstrated atherosclerotic change with monophasic waveforms throughout the left lower extremity. There was some concern about left iliac disease and potentially femoral-popliteal and tibial disease. The patient does not describe claudication although according to his wife he over emphasizes his activity. Patient states he is limited by pain in both knees His wounds are on the left lateral calf. 2 small areas with necrotic debris. We have been using silver collagen and light Ace wraps 05/31/2018; wounds on the left lateral calf in the setting of very significant lymphedema and probably PAD. We have been using silver collagen. The base of the small wound looks reasonably improved. I sent him to see Dr. Fletcher Anon however I see now he has an appointment with Dr. Alvester Chou on February 12 2/5; left lateral calf wound looks the same. His wife is doing a good job with her lymphedema wraps and maintaining that the edema. He most likely has PAD and is due to see Dr. Gwenlyn Found of infectious disease on February 12 2/19; left lateral calf wounds look about the same. This looks like wound secondary to chronic venous insufficiency with lymphedema however the patient has very poor arterial studies. We referred him to Dr. Gwenlyn Found. Dr. Gwenlyn Found did not feel he needed to do anything from an arterial point of view. He did not address the question about the aggressiveness of compression. After some thoughts about this I elected to go ahead and put him in 3 layer compression which  after all would be less compression then the lymphedema clinic was putting on this. I am hopeful that this should be enough to get some closure of the small wounds 2/26; left lateral calf wound letter this week. He tolerated the 3 layer compression well furthermore he is sleeping in a hospital bed which helps keep his legs up at night. The wound looks better measuring smaller especially in width 3/3; left lateral calf wound about the same size. The 3 layer compression has really helped get the edema down in his leg however. Using silver collagen 3/11; left lateral calf wound better looking wound surface but about the same size. We have been using 3 layer compression which is really helped get the edema down in the left leg. Even after Dr. Kennon Holter assessment I am reluctant to go to 4 layer EMMANUAL, GAUTHREAUX A. ([id number]) compression. He is tolerating 3 layer compression well. We have been using silver collagen 3/18-Patient returns for his left lateral calf wound which overall is looking better, slightly increased in size and dimensions and area, he is tolerating the 3 layer compression, has been dressed with silver collagen which we will continue  3/25 left lateral calf wound much better looking. Size is smaller. He has been using silver collagen 4/1; gradually getting smaller in size. Using silver collagen with 3 layer compression. I think the patient has some degree of PAD. He saw Dr. Gwenlyn Found in consultation, he did not specifically comment on whether he could tolerate 4 layer compression 4/8; the wound is slightly smaller in depth. We are using 3 layer compression with silver collagen. I had him seen for arterial insufficiency however his ABI was 0.57 in the left leg. Dr. Gwenlyn Found really did not seem to middle on the degree of compression he could tolerate 4/15; the wound is slightly smaller and slightly less deep. We have been using 3 layer compression with silver collagen. There is not an option for  4-layer compression here. 4/22; the patient is making decent progress on his wound on the left lateral calf however he arrives in with a superficial wound on the right anterior tibial area. Were not really sure how this happened. He had been using a stocking on the right leg. He has been using silver collagen on the wounds Amazing to me he is he appears to have compression pumps at home. I am not really sure I knew this before. In any case he has not been using them 4/29; left lateral calf wound continues to get gradually smaller. This looks like it is on its way to closure. The area on the right anterior tibial area is about the same in terms of size superficial without any depth. This was probably wrap injury He tells me that he has been using his compression pumps once a day for 1 hour without any pain 5/6; not as good today. Left lateral calf wound is actually larger and he does not have quite as good edema control. The area on the right anterior tibia is about the same still superficial. He states he is using his compression pumps once a day. We are using 3 layer compression on the left and Curlex Coban on the right 5/13; the area on the right is very close to being healed. The area on the left still has some depth but has a healthy wound surface. He has excellent edema control bilaterally. Using Hydrofera Blue 5/20-Patient returns in 1 week after being in 3 layer compression on the left, the left lateral calf ulcer is stable with some slough, the right anterior tibial ulcer is healed we have been using Kerlix Coban on that leg 5/27; Hydrofera Blue and 3 layer compression. Still some slough in the wound area and some nonviable edges around the wound. 6/3; this patient has lymphedema and chronic venous insufficiency. He had difficult to close wound on his left lateral calf. This is closed today. 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 Finally he has  known PAD but he is managed to tolerate the compression we reporting on him and also using his compression pumps that he already had at home twice a day 6/10; we discharge this patient last week having finally close the small area in the left lateral calf. Apparently by Friday of last week this had opened and was draining. He is therefore back in clinic. His wife who is present also was concerned about an area on the right anterior tibia."o 6/17; the patient does not have an open area on the right leg. On the left he has a small circular area anteriorly and the same area laterally that he had last  time. However both of these appear to be improved he has his Farrow wrap 30/40. It turns out that he is only using his external compression pumps once a day. I tried to get him up to twice a day today 6/24; the patient comes in with his external compression garment on the right leg/Farrow wrap. As far as we know no open wounds here. He has 2 areas that are smaller one on the left anterior pretibial and one on the left lateral calf which is the site of his initial wounds. I have successfully caught him into his external compression pumps twice a day 7/1; still using his external compression garment on the right leg. As far as we know there are no open wounds here. The area on the left lateral calf which was the site of his initial wound has closed over. The area on the left anterior pretibial that we identified last week is still open. Objective Constitutional ROLLA, KEDZIERSKI ([id number]) Sitting or standing Blood Pressure is within target range for patient.. Pulse regular and within target range for patient.Marland Kitchen Respirations regular, non-labored and within target range.. Temperature is normal and within the target range for the patient.Marland Kitchen appears in no distress. Vitals Time Taken: 2:45 PM, Height: 71 in, Weight: 252 lbs, BMI: 35.1, Temperature: 98.3 F, Pulse: 72 bpm, Respiratory Rate: 20 breaths/min,  Blood Pressure: 146/56 mmHg. Eyes Conjunctivae clear. No discharge. Respiratory Respiratory effort is easy and symmetric bilaterally. Rate is normal at rest and on room air.. Cardiovascular Pedal pulses absent bilaterally.. Lymphatic None palpable in the popliteal area bilaterally. Psychiatric No evidence of depression, anxiety, or agitation. Calm, cooperative, and communicative. Appropriate interactions and affect.. General Notes: Wound exam; the lateral left calf wound that was his original wound is closed over. He still has the area on the left anterior pre-tibia this is superficial. His edema control seems adequate. I cannot feel his peripheral pulses although this is not new and he seems reasonably asymptomatic Integumentary (Hair, Skin) Changes of chronic venous insufficiency with lymphedema. Wound #1 status is Healed - Epithelialized. Original cause of wound was Gradually Appeared. The wound is located on the Left,Lateral Lower Leg. The wound measures 0cm length x 0cm width x 0cm depth; 0cm^2 area and 0cm^3 volume. There is Fat Layer (Subcutaneous Tissue) Exposed exposed. There is no tunneling or undermining noted. There is a medium amount of serous drainage noted. The wound margin is flat and intact. There is large (67-100%) red granulation within the wound bed. There is no necrotic tissue within the wound bed. Wound #3 status is Open. Original cause of wound was Blister. The wound is located on the Left,Medial Lower Leg. The wound measures 1cm length x 1.5cm width x 0.1cm depth; 1.178cm^2 area and 0.118cm^3 volume. There is Fat Layer (Subcutaneous Tissue) Exposed exposed. There is no tunneling or undermining noted. There is a medium amount of serous drainage noted. The wound margin is flat and intact. There is medium (34-66%) red granulation within the wound bed. There is a medium (34-66%) amount of necrotic tissue within the wound bed including Adherent Slough. 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 Edema Control: Wound #3 Left,Medial Lower Leg: 3 Layer Compression System - Left Lower Extremity Patient to wear own Velcro compression garment. - Right Compression Pump: Use compression pump on left lower extremity for 60 minutes, twice daily. Compression Pump: Use compression pump on right lower extremity for 60 minutes, twice daily. 1. Still using silver alginate ABDs and 3 layer compression. He uses his compression pumps twice a day religiously 2. His edema control is really quite good. Electronic Signature(s) Signed: 11/01/2018 6:06:19 PM By: Linton Ham MD Entered By: Linton Ham on 11/01/2018 18:01:17 [name] ([id number]) -------------------------------------------------------------------------------- SuperBill Details Patient Name: [name] Date of Service: 11/01/2018 Medical Record Number: [id number] Patient Account Number: [id number] Date of Birth/Sex: 1931-01-13 (85 y.o. M) Treating RN: Cornell Barman Primary Care Provider: Fulton Reek Other Clinician: Referring Provider: Fulton Reek Treating Provider/Extender: Tito Dine in Treatment: 24 Diagnosis Coding ICD-10 Codes Code  Description [phone] Non-pressure chronic ulcer of left calf with other specified severity I89.0 Lymphedema, not elsewhere classified I70.212 Atherosclerosis of native arteries of extremities with intermittent claudication, left leg L97.211 Non-pressure chronic ulcer of right calf limited to breakdown of skin Facility Procedures CPT4 Code: 14239532 Description: (Facility Use Only) [facility] LWR LT LEG Modifier: Quantity: 1 Physician Procedures CPT4 Code: 0233435 Description: 68616 - WC PHYS LEVEL 3 - EST PT ICD-10 Diagnosis Description L97.228 Non-pressure chronic ulcer of left calf with other specified se I89.0 Lymphedema, not elsewhere classified Modifier: verity Quantity: 1 Electronic Signature(s) Signed: 11/01/2018 6:06:19 PM By: Linton Ham MD Entered By: Linton Ham on 11/01/2018 18:01:36

## 2018-11-08 ENCOUNTER — Encounter: Payer: Medicare Other | Admitting: Internal Medicine

## 2018-11-08 ENCOUNTER — Other Ambulatory Visit: Payer: Self-pay

## 2018-11-08 DIAGNOSIS — I890 Lymphedema, not elsewhere classified: Secondary | ICD-10-CM | POA: Diagnosis not present

## 2018-11-09 NOTE — Progress Notes (Signed)
Steven Meza, Steven Meza ([id number]) Visit Report for 11/08/2018 Arrival Information Details Patient Name: Steven Meza, Steven Meza Date of Service: 11/08/2018 3:00 PM Medical Record Number: [id number] Patient Account Number: [id number] Date of Birth/Sex: Mar 05, 1931 (85 y.o. M) Treating Steven Meza: Steven Meza Primary Care Reagan Behlke: Fulton Reek Other Clinician: Referring Eveny Anastas: Fulton Reek Treating Kayshaun Polanco/Extender: Steven Meza in Treatment: 25 Visit Information History Since Last Visit Added or deleted any medications: No Patient Arrived: Cane Any new allergies or adverse reactions: No Arrival Time: 15:03 Had a fall or experienced change in No Accompanied By: self activities of daily living that may affect Transfer Assistance: None risk of falls: Signs or symptoms of abuse/neglect since last visito No Has Dressing in Place as Prescribed: Yes Pain Present Now: No Electronic Signature(s) Signed: 11/09/2018 2:48:20 PM By: Steven Meza Entered By: Steven Meza on 11/08/2018 15:03:17 Steven Meza ([id number]) -------------------------------------------------------------------------------- Compression Therapy Details Patient Name: Steven Meza Date of Service: 11/08/2018 3:00 PM Medical Record Number: [id number] Patient Account Number: [id number] Date of Birth/Sex: 15-Aug-1930 (85 y.o. M) Treating Steven Meza: Steven Meza Primary Care Salwa Bai: Fulton Reek Other Clinician: Referring Raphel Stickles: Fulton Reek Treating Samaj Wessells/Extender: Steven Meza in Treatment: 25 Compression Therapy Performed for Wound Assessment: Wound #3 Left,Medial Lower Leg Performed By: Clinician Steven Barman, Steven Meza Compression Type: Three Layer Pre Treatment ABI: 0.6 Post Procedure Diagnosis Same as Pre-procedure Electronic Signature(s) Signed: 11/08/2018 5:54:22 PM By: Steven Meza, BSN, Steven Meza, Steven Meza, Steven Meza, Steven Meza Entered By: Steven Meza, BSN, Steven Meza, Steven Meza, Steven on 11/08/2018 15:37:25 Steven Meza  ([id number]) -------------------------------------------------------------------------------- Encounter Discharge Information Details Patient Name: Steven Meza Date of Service: 11/08/2018 3:00 PM Medical Record Number: [id number] Patient Account Number: [id number] Date of Birth/Sex: December 21, 1930 (85 y.o. M) Treating Steven Meza: Steven Meza Primary Care Tyne Banta: Fulton Reek Other Clinician: Referring Arwa Yero: Fulton Reek Treating Rodolph Hagemann/Extender: Steven Meza in Treatment: 25 Encounter Discharge Information Items Discharge Condition: Stable Ambulatory Status: Cane Discharge Destination: Home Transportation: Private Auto Accompanied By: self Schedule Follow-up Appointment: Yes Clinical Summary of Care: Electronic Signature(s) Signed: 11/09/2018 2:48:20 PM By: Steven Meza Entered By: Steven Meza on 11/08/2018 15:50:54 Steven Meza ([id number]) -------------------------------------------------------------------------------- Lower Extremity Assessment Details Patient Name: Steven Meza Date of Service: 11/08/2018 3:00 PM Medical Record Number: [id number] Patient Account Number: [id number] Date of Birth/Sex: [birth year] (85 y.o. M) Treating Steven Meza: Steven Meza Primary Care Devron Cohick: Fulton Reek Other Clinician: Referring Keriana Sarsfield: Fulton Reek Treating Boneta Standre/Extender: Steven Meza in Treatment: 25 Edema Assessment Assessed: [Left: No] [Right: No] Edema: [Left: N] [Right: o] Calf Left: Right: Point of Measurement: 34 cm From Medial Instep 45.5 cm cm Ankle Left: Right: Point of Measurement: 12 cm From Medial Instep 28 cm cm Vascular Assessment Pulses: Dorsalis Pedis Palpable: [Left:Yes] Electronic Signature(s) Signed: 11/09/2018 2:48:20 PM By: Steven Meza Entered By: Steven Meza on 11/08/2018 15:11:09 Steven Meza ([id number]) -------------------------------------------------------------------------------- Multi Wound Chart  Details Patient Name: Steven Meza Date of Service: 11/08/2018 3:00 PM Medical Record Number: [id number] Patient Account Number: [id number] Date of Birth/Sex: 01-30-1931 (85 y.o. M) Treating Steven Meza: Steven Meza Primary Care Kyston Gonce: Fulton Reek Other Clinician: Referring Parrish Daddario: Fulton Reek Treating Payzlee Ryder/Extender: Steven Meza in Treatment: 25 Vital Signs Height(in): 71 Pulse(bpm): 82 Weight(lbs): 252 Blood Pressure(mmHg): 175/84 Body Mass Index(BMI): 35 Temperature(F): 97.7 Respiratory Rate 16 (breaths/min): Photos: [N/A:N/A] Wound Location: Left Lower Leg - Medial N/A N/A Wounding Event: Blister N/A N/A Primary Etiology: Venous Leg Ulcer N/A N/A Comorbid History: Lymphedema, Coronary Artery N/A N/A Disease, Hypertension Date Acquired: 10/10/2018  N/A N/A Weeks of Treatment: 4 N/A N/A Wound Status: Open N/A N/A Measurements L x W x D 1x0.9x0.1 N/A N/A (cm) Area (cm) : 0.707 N/A N/A Volume (cm) : 0.071 N/A N/A % Reduction in Area: 30.80% N/A N/A % Reduction in Volume: 30.40% N/A N/A Classification: Partial Thickness N/A N/A Exudate Amount: Medium N/A N/A Exudate Type: Serous N/A N/A Exudate Color: amber N/A N/A Wound Margin: Flat and Intact N/A N/A Granulation Amount: Medium (34-66%) N/A N/A Granulation Quality: Red N/A N/A Necrotic Amount: Medium (34-66%) N/A N/A Exposed Structures: Fat Layer (Subcutaneous N/A N/A Tissue) Exposed: Yes Fascia: No Tendon: No Muscle: No Joint: No Bone: No Steven Meza, Steven Meza ([id number]) Epithelialization: Medium (34-66%) N/A N/A Procedures Performed: Compression Therapy N/A N/A Treatment Notes Wound #3 (Left, Medial Lower Leg) Notes silver cell, ABD, 3 layer Electronic Signature(s) Signed: 11/08/2018 5:45:20 PM By: Steven Meza Entered By: Steven Ham on 11/08/2018 17:20:54 Steven Meza  ([id number]) -------------------------------------------------------------------------------- Multi-Disciplinary Care Plan Details Patient Name: Steven Meza Date of Service: 11/08/2018 3:00 PM Medical Record Number: [id number] Patient Account Number: [id number] Date of Birth/Sex: 1930/08/26 (85 y.o. M) Treating Steven Meza: Steven Meza Primary Care Joyelle Siedlecki: Fulton Reek Other Clinician: Referring Damaria Stofko: Fulton Reek Treating Gizella [place]/Extender: Steven Meza in Treatment: 25 Active Inactive Orientation to the Wound Care Program Nursing Diagnoses: Knowledge deficit related to the wound healing center program Goals: Patient/caregiver will verbalize understanding of the [facility] Date Initiated: 05/17/2018 Target Resolution Date: 06/17/2018 Goal Status: Active Interventions: Provide education on orientation to the wound center Notes: Venous Leg Ulcer Nursing Diagnoses: Potential for venous Insuffiency (use before diagnosis confirmed) Goals: Patient/caregiver will verbalize understanding of disease process and disease management Date Initiated: 05/17/2018 Target Resolution Date: 06/17/2018 Goal Status: Active Interventions: Assess peripheral edema status every visit. Treatment Activities: Non-invasive vascular studies : 05/17/2018 Notes: Wound/Skin Impairment Nursing Diagnoses: Impaired tissue integrity Goals: Patient/caregiver will verbalize understanding of skin care regimen Date Initiated: 05/17/2018 Target Resolution Date: 05/17/2018 Goal Status: Active Steven Meza, Steven Meza ([id number]) Ulcer/skin breakdown will heal within 14 weeks Date Initiated: 05/17/2018 Target Resolution Date: 08/16/2018 Goal Status: Active Interventions: Assess ulceration(s) every visit Treatment Activities: Referred to DME Zayan Delvecchio for dressing supplies : 05/17/2018 Topical wound management initiated : 05/17/2018 Notes: Electronic Signature(s) Signed: 11/08/2018 5:54:22 PM  By: Steven Meza, BSN, Steven Meza, Steven Meza, Steven Meza, Steven Meza Entered By: Steven Meza, BSN, Steven Meza, Steven Meza, Steven on 11/08/2018 15:35:31 Steven Meza ([id number]) -------------------------------------------------------------------------------- Pain Assessment Details Patient Name: Steven Meza Date of Service: 11/08/2018 3:00 PM Medical Record Number: [id number] Patient Account Number: [id number] Date of Birth/Sex: 17-Jul-1930 (85 y.o. M) Treating Steven Meza: Steven Meza Primary Care Jalexis Breed: Fulton Reek Other Clinician: Referring Tong Pieczynski: Fulton Reek Treating Emmanuel Ercole/Extender: Steven Meza in Treatment: 25 Active Problems Location of Pain Severity and Description of Pain Patient Has Paino No Site Locations Pain Management and Medication Current Pain Management: Electronic Signature(s) Signed: 11/09/2018 2:48:20 PM By: Steven Meza Entered By: Steven Meza on 11/08/2018 15:03:26 Steven Meza ([id number]) -------------------------------------------------------------------------------- Wound Assessment Details Patient Name: Steven Meza Date of Service: 11/08/2018 3:00 PM Medical Record Number: [id number] Patient Account Number: [id number] Date of Birth/Sex: [birth year] (85 y.o. M) Treating Steven Meza: Steven Meza Primary Care Masahiro Iglesia: Fulton Reek Other Clinician: Referring Jaydalyn Demattia: Fulton Reek Treating Quadarius Henton/Extender: Steven Meza in Treatment: 25 Wound Status Wound Number: 3 Primary Venous Leg Ulcer Etiology: Wound Location: Left Lower Leg - Medial Wound Status: Open Wounding Event: Blister Comorbid Lymphedema, Coronary Artery Disease, Date Acquired: 10/10/2018 History:  Hypertension Weeks Of Treatment: 4 Clustered Wound: No Photos Wound Measurements Length: (cm) 1 % Reduction i Width: (cm) 0.9 % Reduction i Depth: (cm) 0.1 Epithelializa Area: (cm) 0.707 Tunneling: Volume: (cm) 0.071 Undermining: n Area: 30.8% n Volume: 30.4% tion: Medium (34-66%) No No Wound  Description Classification: Partial Thickness Foul Odor Aft Wound Margin: Flat and Intact Slough/Fibrin Exudate Amount: Medium Exudate Type: Serous Exudate Color: amber er Cleansing: No o No Wound Bed Granulation Amount: Medium (34-66%) Exposed Structure Granulation Quality: Red Fascia Exposed: No Necrotic Amount: Medium (34-66%) Fat Layer (Subcutaneous Tissue) Exposed: Yes Necrotic Quality: Adherent Slough Tendon Exposed: No Muscle Exposed: No Joint Exposed: No Bone Exposed: No Treatment Notes Steven Meza, Steven Meza ([id number]) Wound #3 (Left, Medial Lower Leg) Notes silver cell, ABD, 3 layer Electronic Signature(s) Signed: 11/09/2018 2:48:20 PM By: Steven Meza Entered By: Steven Meza on 11/08/2018 15:10:39 Steven Meza ([id number]) -------------------------------------------------------------------------------- Vitals Details Patient Name: Steven Meza Date of Service: 11/08/2018 3:00 PM Medical Record Number: [id number] Patient Account Number: [id number] Date of Birth/Sex: [birth year] (85 y.o. M) Treating Steven Meza: Steven Meza Primary Care Mialani Reicks: Fulton Reek Other Clinician: Referring Raymona Boss: Fulton Reek Treating Brayton Baumgartner/Extender: Steven Meza in Treatment: 25 Vital Signs Time Taken: 15:03 Temperature (F): 97.7 Height (in): 71 Pulse (bpm): 82 Weight (lbs): 252 Respiratory Rate (breaths/min): 16 Body Mass Index (BMI): 35.1 Blood Pressure (mmHg): 175/84 Reference Range: 80 - 120 mg / dl Electronic Signature(s) Signed: 11/09/2018 2:48:20 PM By: Steven Meza Entered By: Steven Meza on 11/08/2018 15:07:46

## 2018-11-09 NOTE — Progress Notes (Signed)
GANESH, DEEG ([id number]) Visit Report for 11/08/2018 HPI Details Patient Name: Steven Meza, Steven Meza Date of Service: 11/08/2018 3:00 PM Medical Record Number: [id number] Patient Account Number: [id number] Date of Birth/Sex: 12-22-1930 (85 y.o. M) Treating RN: Cornell Barman Primary Care Provider: Fulton Reek Other Clinician: Referring Provider: Fulton Reek Treating Provider/Extender: Tito Dine in Treatment: 25 History of Present Illness HPI Description: ADMISSION 05/17/2018 Steven Meza is an 85 year old man who is been treated at the lymphedema clinic for a long period with lymphedema wraps for bilateral lower extremity lymphedema. About 6 months ago they noted small open areas on his left lateral calf just above the ankle. These were open. They are apparently putting some form of ointment on this. They have been referred here for our review of this. The patient has a long history of lymphedema and venous stasis. It says in his records that he has recurrent cellulitis although his wife denies this but she states that the lymphedema may have started with cellulitis several years ago. Also in his record there is a history of bladder cancer which they seem to know very little about however he did not have any radiation to the pelvis or anything that could have contributed to lymphedema that they are aware of. There is no prior wound history. The patient has 2 small punched out areas on the left lateral calf that have adherent debris on the surface. Past medical history; lymphedema, hypertension, cellulitis, hearing loss, morbid obesity, osteoarthritis, venous stasis, bladder CA, diverticulosis. ABIs in our clinic were 0.36 on the right and 0.57 on the left 05/24/2018; corrected age on this patient is 3 versus what I said last week. He has been for his arterial studies which predictably are not very good. On the right his ABI is 0.65. Monophasic waveforms noted at the right ankle  including the posterior tibial and dorsalis pedis. He has triphasic waveforms of the common femoral and profunda femoris triphasic proximal SFA with monophasic distal FSA and popliteal artery. Monophasic tibial waveforms. On the left his ABI is 0.58 monophasic waveforms at the left ankle. Duplex of the left lower extremity demonstrated atherosclerotic change with monophasic waveforms throughout the left lower extremity. There was some concern about left iliac disease and potentially femoral-popliteal and tibial disease. The patient does not describe claudication although according to his wife he over emphasizes his activity. Patient states he is limited by pain in both knees His wounds are on the left lateral calf. 2 small areas with necrotic debris. We have been using silver collagen and light Ace wraps 05/31/2018; wounds on the left lateral calf in the setting of very significant lymphedema and probably PAD. We have been using silver collagen. The base of the small wound looks reasonably improved. I sent him to see Dr. Fletcher Anon however I see now he has an appointment with Dr. Alvester Chou on February 12 2/5; left lateral calf wound looks the same. His wife is doing a good job with her lymphedema wraps and maintaining that the edema. He most likely has PAD and is due to see Dr. Gwenlyn Found of infectious disease on February 12 2/19; left lateral calf wounds look about the same. This looks like wound secondary to chronic venous insufficiency with lymphedema however the patient has very poor arterial studies. We referred him to Dr. Gwenlyn Found. Dr. Gwenlyn Found did not feel he needed to do anything from an arterial point of view. He did not address the question about the aggressiveness of compression. After some thoughts about  this I elected to go ahead and put him in 3 layer compression which after all would be less compression then the lymphedema clinic was putting on this. I am hopeful that this should be enough to get  some closure of the small wounds 2/26; left lateral calf wound letter this week. He tolerated the 3 layer compression well furthermore he is sleeping in a hospital bed which helps keep his legs up at night. The wound looks better measuring smaller especially in width 3/3; left lateral calf wound about the same size. The 3 layer compression has really helped get the edema down in his leg RHET, RORKE A. ([id number]) however. Using silver collagen 3/11; left lateral calf wound better looking wound surface but about the same size. We have been using 3 layer compression which is really helped get the edema down in the left leg. Even after Dr. Kennon Holter assessment I am reluctant to go to 4 layer compression. He is tolerating 3 layer compression well. We have been using silver collagen 3/18-Patient returns for his left lateral calf wound which overall is looking better, slightly increased in size and dimensions and area, he is tolerating the 3 layer compression, has been dressed with silver collagen which we will continue 3/25oleft lateral calf wound much better looking. Size is smaller. He has been using silver collagen 4/1; gradually getting smaller in size. Using silver collagen with 3 layer compression. I think the patient has some degree of PAD. He saw Dr. Gwenlyn Found in consultation, he did not specifically comment on whether he could tolerate 4 layer compression 4/8; the wound is slightly smaller in depth. We are using 3 layer compression with silver collagen. I had him seen for arterial insufficiency however his ABI was 0.57 in the left leg. Dr. Gwenlyn Found really did not seem to middle on the degree of compression he could tolerate 4/15; the wound is slightly smaller and slightly less deep. We have been using 3 layer compression with silver collagen. There is not an option for 4-layer compression here. 4/22; the patient is making decent progress on his wound on the left lateral calf however he arrives in  with a superficial wound on the right anterior tibial area. Were not really sure how this happened. He had been using a stocking on the right leg. He has been using silver collagen on the wounds Amazing to me he is he appears to have compression pumps at home. I am not really sure I knew this before. In any case he has not been using them 4/29; left lateral calf wound continues to get gradually smaller. This looks like it is on its way to closure. oThe area on the right anterior tibial area is about the same in terms of size superficial without any depth. This was probably wrap injury oHe tells me that he has been using his compression pumps once a day for 1 hour without any pain 5/6; not as good today. Left lateral calf wound is actually larger and he does not have quite as good edema control. The area on the right anterior tibia is about the same still superficial. He states he is using his compression pumps once a day. We are using 3 layer compression on the left and Curlex Coban on the right 5/13; the area on the right is very close to being healed. The area on the left still has some depth but has a healthy wound surface. He has excellent edema control bilaterally. Using Hydrofera Blue 5/20-Patient returns  in 1 week after being in 3 layer compression on the left, the left lateral calf ulcer is stable with some slough, the right anterior tibial ulcer is healed we have been using Kerlix Coban on that leg 5/27; Hydrofera Blue and 3 layer compression. Still some slough in the wound area and some nonviable edges around the wound. 6/3; this patient has lymphedema and chronic venous insufficiency. He had difficult to close wound on his left lateral calf. This is closed today. 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 Finally he has known PAD but he is managed to tolerate the compression we reporting on him and also using his compression pumps that he already  had at home twice a day 6/10; we discharge this patient last week having finally close the small area in the left lateral calf. Apparently by Friday of last week this had opened and was draining. He is therefore back in clinic. His wife who is present also was concerned about an area on the right anterior tibia."o 6/17; the patient does not have an open area on the right leg. On the left he has a small circular area anteriorly and the same area laterally that he had last time. However both of these appear to be improved he has his Farrow wrap 30/40. It turns out that he is only using his external compression pumps once a day. I tried to get him up to twice a day today 6/24; the patient comes in with his external compression garment on the right leg/Farrow wrap. As far as we know no open wounds here. He has 2 areas that are smaller one on the left anterior pretibial and one on the left lateral calf which is the site of his initial wounds. I have successfully caught him into his external compression pumps twice a day 7/1; still using his external compression garment on the right leg. As far as we know there are no open wounds here. The area on the left lateral calf which was the site of his initial wound has closed over. The area on the left anterior pretibial that we identified last week is still open. 7/8; as far as we know he has no open wounds on the right leg. He has his external compression garment on here. The original wound on the left lateral calf has closed over. He developed an area on the left anterior pretibial 2 weeks ago and seems to have 2 smaller areas on the proximal and distal lateral calf. We do not have his good edema control this week as I am used to seeing Electronic Signature(s) Signed: 11/08/2018 5:45:20 PM By: Linton Ham MD Entered By: Linton Ham on 11/08/2018 17:22:03 Steven Meza ([id number]) AIREN, STIEHL  ([id number]) -------------------------------------------------------------------------------- Physical Exam Details Patient Name: Steven Meza Date of Service: 11/08/2018 3:00 PM Medical Record Number: [id number] Patient Account Number: [id number] Date of Birth/Sex: December 08, 1930 (85 y.o. M) Treating RN: Cornell Barman Primary Care Provider: Fulton Reek Other Clinician: Referring Provider: Fulton Reek Treating Provider/Extender: Tito Dine in Treatment: 25 Constitutional Patient is hypertensive.. Pulse regular and within target range for patient.Marland Kitchen Respirations regular, non-labored and within target range.. Temperature is normal and within the target range for the patient.Marland Kitchen appears in no distress. Eyes Conjunctivae clear. No discharge. Respiratory Respiratory effort is easy and symmetric bilaterally. Rate is normal at rest and on room air.. Cardiovascular Pedal pulses palpable.. Lymphedema,. Lymphatic None palpable in the popliteal area. Integumentary (Hair,  Skin) Skin and subcutaneous tissue without rashes, lesions, discoloration or ulcers. No evidence of fungal disease. Hair and skin color texture normal and healthy in appearance.Marland Kitchen Psychiatric No evidence of depression, anxiety, or agitation. Calm, cooperative, and communicative. Appropriate interactions and affect.. Notes Wound exam; the lateral left calf original wound is closed however he has 2 small areas proximally and distally in the left calf and still the area on the left distal tibia. His edema control is not quite as good this week either. Electronic Signature(s) Signed: 11/08/2018 5:45:20 PM By: Linton Ham MD Entered By: Linton Ham on 11/08/2018 17:23:39 Steven Meza ([id number]) -------------------------------------------------------------------------------- Physician Orders Details Patient Name: Steven Meza Date of Service: 11/08/2018 3:00 PM Medical Record Number: [id number] Patient  Account Number: [id number] Date of Birth/Sex: 04-13-1931 (85 y.o. M) Treating RN: Cornell Barman Primary Care Provider: Fulton Reek Other Clinician: Referring Provider: Fulton Reek Treating Provider/Extender: Tito Dine in Treatment: 40 Verbal / Phone Orders: No Diagnosis Coding Wound Cleansing Wound #3 Left,Medial Lower Leg o Dial antibacterial soap, wash wounds, rinse and pat dry prior to dressing wounds Primary Wound Dressing Wound #3 Left,Medial Lower Leg o Silver Alginate Secondary Dressing Wound #3 Left,Medial Lower Leg o ABD pad Dressing Change Frequency Wound #3 Left,Medial Lower Leg o Change dressing every week - In office Follow-up Appointments Wound #3 Left,Medial Lower Leg o Return Appointment in 1 week. Edema Control Wound #3 Left,Medial Lower Leg o 3 Layer Compression System - Left Lower Extremity o Patient to wear own Velcro compression garment. - Right o Compression Pump: Use compression pump on left lower extremity for 60 minutes, twice daily. o Compression Pump: Use compression pump on right lower extremity for 60 minutes, twice daily. Electronic Signature(s) Signed: 11/08/2018 5:45:20 PM By: Linton Ham MD Signed: 11/08/2018 5:54:22 PM By: Gretta Cool, BSN, RN, CWS, Kim RN, BSN Entered By: Gretta Cool, BSN, RN, CWS, Kim on 11/08/2018 15:37:49 Steven Meza ([id number]) -------------------------------------------------------------------------------- Problem List Details Patient Name: Steven Meza Date of Service: 11/08/2018 3:00 PM Medical Record Number: [id number] Patient Account Number: [id number] Date of Birth/Sex: 1930-09-05 (85 y.o. 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 M) Treating RN: Cornell Barman Primary Care Provider: Fulton Reek Other Clinician: Referring Provider: Fulton Reek Treating Provider/Extender: Tito Dine in Treatment: 25 Subjective History of Present Illness (HPI) ADMISSION 05/17/2018 Mr. Andonian is an 85 year old man who is been treated at the lymphedema clinic for a long period with lymphedema wraps for bilateral lower extremity lymphedema. About 6 months ago they noted small open areas on his left lateral calf just above the ankle. These were open. They are apparently putting some form of ointment on this. They have been referred here for  our review of this. The patient has a long history of lymphedema and venous stasis. It says in his records that he has recurrent cellulitis although his wife denies this but she states that the lymphedema may have started with cellulitis several years ago. Also in his record there is a history of bladder cancer which they seem to know very little about however he did not have any radiation to the pelvis or anything that could have contributed to lymphedema that they are  aware of. There is no prior wound history. The patient has 2 small punched out areas on the left lateral calf that have adherent debris on the surface. Past medical history; lymphedema, hypertension, cellulitis, hearing loss, morbid obesity, osteoarthritis, venous stasis, bladder CA, diverticulosis. ABIs in our clinic were 0.36 on the right and 0.57 on the left 05/24/2018; corrected age on this patient is 76 versus what I said last week. He has been for his arterial studies which predictably are not very good. On the right his ABI is 0.65. Monophasic waveforms noted at the right ankle including the posterior tibial and dorsalis pedis. He has triphasic waveforms of the common femoral and profunda femoris triphasic proximal SFA with monophasic distal FSA and popliteal artery. Monophasic tibial waveforms. On the left his ABI is 0.58 monophasic waveforms at the left ankle. Duplex of the left lower extremity demonstrated atherosclerotic change with monophasic waveforms throughout the left lower extremity. There was some concern about left iliac disease and potentially femoral-popliteal and tibial disease. The patient does not describe claudication although according to his wife he over emphasizes his activity. Patient states he is limited by pain in both knees His wounds are on the left lateral calf. 2 small areas with necrotic debris. We have been using silver collagen and light Ace wraps 05/31/2018; wounds on the left lateral calf in the setting of very significant lymphedema and probably PAD. We have been using silver collagen. The base of the small wound looks reasonably improved. I sent him to see Dr. Fletcher Anon however I see now he has an appointment with Dr. Alvester Chou on February 12 2/5; left lateral calf wound looks the same. His wife is doing a good job with her lymphedema wraps and maintaining that the edema. He most likely has PAD and is due to see Dr. Gwenlyn Found of infectious disease on February 12 2/19;  left lateral calf wounds look about the same. This looks like wound secondary to chronic venous insufficiency with lymphedema however the patient has very poor arterial studies. We referred him to Dr. Gwenlyn Found. Dr. Gwenlyn Found did not feel he needed to do anything from an arterial point of view. He did not address the question about the aggressiveness of compression. After some thoughts about this I elected to go ahead and put him in 3 layer compression which after all would be less compression then the lymphedema clinic was putting on this. I am hopeful that this should be enough to get some closure of the small wounds 2/26; left lateral calf wound letter this week. He tolerated the 3 layer compression well furthermore he is sleeping in a hospital bed which helps keep his legs up at night. The wound looks better measuring smaller especially in width 3/3; left lateral calf wound about the same size. The 3 layer compression has really helped get the edema down in his leg however. Using silver collagen 3/11; left lateral calf wound better looking wound surface but about the same size. We have been using 3  layer compression which is really helped get the edema down in the left leg. Even after Dr. Kennon Holter assessment I am reluctant to go to 4 layer AKSH, SWART A. ([id number]) compression. He is tolerating 3 layer compression well. We have been using silver collagen 3/18-Patient returns for his left lateral calf wound which overall is looking better, slightly increased in size and dimensions and area, he is tolerating the 3 layer compression, has been dressed with silver collagen which we will continue 3/25 left lateral calf wound much better looking. Size is smaller. He has been using silver collagen 4/1; gradually getting smaller in size. Using silver collagen with 3 layer compression. I think the patient has some degree of PAD. He saw Dr. Gwenlyn Found in consultation, he did not specifically comment on whether he  could tolerate 4 layer compression 4/8; the wound is slightly smaller in depth. We are using 3 layer compression with silver collagen. I had him seen for arterial insufficiency however his ABI was 0.57 in the left leg. Dr. Gwenlyn Found really did not seem to middle on the degree of compression he could tolerate 4/15; the wound is slightly smaller and slightly less deep. We have been using 3 layer compression with silver collagen. There is not an option for 4-layer compression here. 4/22; the patient is making decent progress on his wound on the left lateral calf however he arrives in with a superficial wound on the right anterior tibial area. Were not really sure how this happened. He had been using a stocking on the right leg. He has been using silver collagen on the wounds Amazing to me he is he appears to have compression pumps at home. I am not really sure I knew this before. In any case he has not been using them 4/29; left lateral calf wound continues to get gradually smaller. This looks like it is on its way to closure. The area on the right anterior tibial area is about the same in terms of size superficial without any depth. This was probably wrap injury He tells me that he has been using his compression pumps once a day for 1 hour without any pain 5/6; not as good today. Left lateral calf wound is actually larger and he does not have quite as good edema control. The area on the right anterior tibia is about the same still superficial. He states he is using his compression pumps once a day. We are using 3 layer compression on the left and Curlex Coban on the right 5/13; the area on the right is very close to being healed. The area on the left still has some depth but has a healthy wound surface. He has excellent edema control bilaterally. Using Hydrofera Blue 5/20-Patient returns in 1 week after being in 3 layer compression on the left, the left lateral calf ulcer is stable with some slough,  the right anterior tibial ulcer is healed we have been using Kerlix Coban on that leg 5/27; Hydrofera Blue and 3 layer compression. Still some slough in the wound area and some nonviable edges around the wound. 6/3; this patient has lymphedema and chronic venous insufficiency. He had difficult to close wound on his left lateral calf. This is closed today. 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 Finally he has known PAD but he is managed to tolerate the compression we reporting on him and also using his compression pumps that he already had at home twice a day 6/10;  we discharge this patient last week having finally close the small area in the left lateral calf. Apparently by Friday of last week this had opened and was draining. He is therefore back in clinic. His wife who is present also was concerned about an area on the right anterior tibia."o 6/17; the patient does not have an open area on the right leg. On the left he has a small circular area anteriorly and the same area laterally that he had last time. However both of these appear to be improved he has his Farrow wrap 30/40. It turns out that he is only using his external compression pumps once a day. I tried to get him up to twice a day today 6/24; the patient comes in with his external compression garment on the right leg/Farrow wrap. As far as we know no open wounds here. He has 2 areas that are smaller one on the left anterior pretibial and one on the left lateral calf which is the site of his initial wounds. I have successfully caught him into his external compression pumps twice a day 7/1; still using his external compression garment on the right leg. As far as we know there are no open wounds here. The area on the left lateral calf which was the site of his initial wound has closed over. The area on the left anterior pretibial that we identified last week is still open. 7/8; as far as we know  he has no open wounds on the right leg. He has his external compression garment on here. The original wound on the left lateral calf has closed over. He developed an area on the left anterior pretibial 2 weeks ago and seems to have 2 smaller areas on the proximal and distal lateral calf. We do not have his good edema control this week as I am used to seeing WADE, ASEBEDO ([id number]) Objective Constitutional Patient is hypertensive.. Pulse regular and within target range for patient.Marland Kitchen Respirations regular, non-labored and within target range.. Temperature is normal and within the target range for the patient.Marland Kitchen appears in no distress. Vitals Time Taken: 3:03 PM, Height: 71 in, Weight: 252 lbs, BMI: 35.1, Temperature: 97.7 F, Pulse: 82 bpm, Respiratory Rate: 16 breaths/min, Blood Pressure: 175/84 mmHg. Eyes Conjunctivae clear. No discharge. Respiratory Respiratory effort is easy and symmetric bilaterally. Rate is normal at rest and on room air.. Cardiovascular Pedal pulses palpable.. Lymphedema,. Lymphatic None palpable in the popliteal area. Psychiatric No evidence of depression, anxiety, or agitation. Calm, cooperative, and communicative. Appropriate interactions and affect.. General Notes: Wound exam; the lateral left calf original wound is closed however he has 2 small areas proximally and distally in the left calf and still the area on the left distal tibia. His edema control is not quite as good this week either. Integumentary (Hair, Skin) Skin and subcutaneous tissue without rashes, lesions, discoloration or ulcers. No evidence of fungal disease. Hair and skin color texture normal and healthy in appearance.. Wound #3 status is Open. Original cause of wound was Blister. The wound is located on the Left,Medial Lower Leg. The wound measures 1cm length x 0.9cm width x 0.1cm depth; 0.707cm^2 area and 0.071cm^3 volume. There is Fat Layer (Subcutaneous Tissue) Exposed exposed. There is  no tunneling or undermining noted. There is a medium amount of serous drainage noted. The wound margin is flat and intact. There is medium (34-66%) red granulation within the wound bed. There is a medium (34-66%) amount of necrotic tissue within the wound  bed including Adherent Slough. 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 There was a Three Layer Compression Therapy Procedure with a pre-treatment ABI of 0.6 by Cornell Barman, RN. Post procedure Diagnosis Wound #3: Same as Pre-Procedure Plan Wound Cleansing: Wound #3 Left,Medial Lower Leg: Dial antibacterial soap, wash wounds, rinse and pat dry prior to dressing wounds Primary Wound Dressing: Wound #3 Left,Medial Lower Leg: Silver Alginate Secondary Dressing: Wound #3 Left,Medial Lower Leg: ABD pad Dressing Change Frequency: Wound #3 Left,Medial Lower Leg: Change dressing every week - In office Follow-up Appointments: Wound #3 Left,Medial Lower Leg: Return Appointment in 1 week. Edema Control: Wound #3 Left,Medial Lower Leg: 3 Layer Compression System - Left Lower Extremity Patient to wear own Velcro compression garment. - Right Compression Pump: Use compression pump on left lower extremity for 60 minutes, twice daily. Compression Pump: Use compression pump on right lower extremity for 60 minutes, twice daily. 1. Continued with the silver alginate/ABDs under 3 layer compression 2. He claims to be religiously using his external compression pumps twice a day 3. Our intake nurse picked up the sleep center recliner with his legs  dangling down. This could be contributing to the problem here. Electronic Signature(s) Signed: 11/08/2018 5:45:20 PM By: Linton Ham MD Entered By: Linton Ham on 11/08/2018 17:26:03 Steven Meza ([id number]) -------------------------------------------------------------------------------- SuperBill Details Patient Name: Steven Meza Date of Service: 11/08/2018 Medical Record Number: [id number] Patient Account Number: [id number] Date of Birth/Sex: 07-Nov-1930 (85 y.o. M) Treating RN: Cornell Barman Primary Care Provider: Fulton Reek Other Clinician: Referring Provider: Fulton Reek Treating Provider/Extender: Tito Dine in Treatment: 25 Diagnosis Coding ICD-10 Codes Meza Description [phone] Non-pressure chronic ulcer of left calf with other specified severity I89.0 Lymphedema, not elsewhere classified I70.212 Atherosclerosis of native arteries of extremities with intermittent claudication, left leg L97.211 Non-pressure chronic ulcer of right calf limited to breakdown of skin Facility Procedures CPT4 Meza: [id number] Description: (Facility Use Only) [phone] - [place] LWR LT LEG Modifier: Quantity: 1 Physician Procedures CPT4 Meza: [id number] Description: [id number] - WC PHYS LEVEL 3 - EST PT ICD-10 Diagnosis Description L97.228 Non-pressure chronic ulcer of left calf with other specified se I89.0 Lymphedema, not elsewhere classified Modifier: verity Quantity: 1 Electronic Signature(s) Signed: 11/08/2018 5:45:20 PM By: Linton Ham MD Entered By: Linton Ham on 11/08/2018 17:26:26

## 2018-11-15 ENCOUNTER — Other Ambulatory Visit
Admission: RE | Admit: 2018-11-15 | Discharge: 2018-11-15 | Disposition: A | Discharge status: Home / Self Care | Payer: Medicare Other | Source: Ambulatory Visit | Attending: Internal Medicine | Admitting: Internal Medicine

## 2018-11-15 ENCOUNTER — Other Ambulatory Visit: Payer: Self-pay

## 2018-11-15 ENCOUNTER — Encounter: Payer: Medicare Other | Admitting: Internal Medicine

## 2018-11-15 DIAGNOSIS — B999 Unspecified infectious disease: Secondary | ICD-10-CM | POA: Diagnosis present

## 2018-11-15 DIAGNOSIS — I890 Lymphedema, not elsewhere classified: Secondary | ICD-10-CM | POA: Diagnosis not present

## 2018-11-16 NOTE — Progress Notes (Signed)
AMES, HOBAN ([id number]) Visit Report for 11/15/2018 HPI Details Patient Name: Steven Meza, Steven Meza Date of Service: 11/15/2018 1:00 PM Medical Record Number: [id number] Patient Account Number: [id number] Date of Birth/Sex: [birth year] (85 y.o. M) Treating RN: Cornell Barman Primary Care Provider: Fulton Reek Other Clinician: Referring Provider: Fulton Reek Treating Provider/Extender: Tito Dine in Treatment: 26 History of Present Illness HPI Description: ADMISSION 05/17/2018 [name] is an 85 year old man who is been treated at the lymphedema clinic for a long period with lymphedema wraps for bilateral lower extremity lymphedema. About 6 months ago they noted small open areas on his left lateral calf just above the ankle. These were open. They are apparently putting some form of ointment on this. They have been referred here for our review of this. The patient has a long history of lymphedema and venous stasis. It says in his records that he has recurrent cellulitis although his wife denies this but she states that the lymphedema may have started with cellulitis several years ago. Also in his record there is a history of bladder cancer which they seem to know very little about however he did not have any radiation to the pelvis or anything that could have contributed to lymphedema that they are aware of. There is no prior wound history. The patient has 2 small punched out areas on the left lateral calf that have adherent debris on the surface. Past medical history; lymphedema, hypertension, cellulitis, hearing loss, morbid obesity, osteoarthritis, venous stasis, bladder CA, diverticulosis. ABIs in our clinic were 0.36 on the right and 0.57 on the left 05/24/2018; corrected age on this patient is 85 versus what I said last week. He has been for his arterial studies which predictably are not very good. On the right his ABI is 0.65. Monophasic waveforms noted at the right ankle  including the posterior tibial and dorsalis pedis. He has triphasic waveforms of the common femoral and profunda femoris triphasic proximal SFA with monophasic distal FSA and popliteal artery. Monophasic tibial waveforms. On the left his ABI is 0.58 monophasic waveforms at the left ankle. Duplex of the left lower extremity demonstrated atherosclerotic change with monophasic waveforms throughout the left lower extremity. There was some concern about left iliac disease and potentially femoral-popliteal and tibial disease. The patient does not describe claudication although according to his wife he over emphasizes his activity. Patient states he is limited by pain in both knees His wounds are on the left lateral calf. 2 small areas with necrotic debris. We have been using silver collagen and light Ace wraps 05/31/2018; wounds on the left lateral calf in the setting of very significant lymphedema and probably PAD. We have been using silver collagen. The base of the small wound looks reasonably improved. I sent him to see Dr. Fletcher Anon however I see now he has an appointment with Dr. Alvester Chou on February 12 2/5; left lateral calf wound looks the same. His wife is doing a good job with her lymphedema wraps and maintaining that the edema. He most likely has PAD and is due to see Dr. Gwenlyn Found of infectious disease on February 12 2/19; left lateral calf wounds look about the same. This looks like wound secondary to chronic venous insufficiency with lymphedema however the patient has very poor arterial studies. We referred him to Dr. Gwenlyn Found. Dr. Gwenlyn Found did not feel he needed to do anything from an arterial point of view. He did not address the question about the aggressiveness of compression. After some thoughts about  this I elected to go ahead and put him in 3 layer compression which after all would be less compression then the lymphedema clinic was putting on this. I am hopeful that this should be enough to get  some closure of the small wounds 2/26; left lateral calf wound letter this week. He tolerated the 3 layer compression well furthermore he is sleeping in a hospital bed which helps keep his legs up at night. The wound looks better measuring smaller especially in width 3/3; left lateral calf wound about the same size. The 3 layer compression has really helped get the edema down in his leg Steven Meza, Steven A. ([id number]) however. Using silver collagen 3/11; left lateral calf wound better looking wound surface but about the same size. We have been using 3 layer compression which is really helped get the edema down in the left leg. Even after Dr. Kennon Holter assessment I am reluctant to go to 4 layer compression. He is tolerating 3 layer compression well. We have been using silver collagen 3/18-Patient returns for his left lateral calf wound which overall is looking better, slightly increased in size and dimensions and area, he is tolerating the 3 layer compression, has been dressed with silver collagen which we will continue 3/25oleft lateral calf wound much better looking. Size is smaller. He has been using silver collagen 4/1; gradually getting smaller in size. Using silver collagen with 3 layer compression. I think the patient has some degree of PAD. He saw Dr. Gwenlyn Found in consultation, he did not specifically comment on whether he could tolerate 4 layer compression 4/8; the wound is slightly smaller in depth. We are using 3 layer compression with silver collagen. I had him seen for arterial insufficiency however his ABI was 0.57 in the left leg. Dr. Gwenlyn Found really did not seem to middle on the degree of compression he could tolerate 4/15; the wound is slightly smaller and slightly less deep. We have been using 3 layer compression with silver collagen. There is not an option for 4-layer compression here. 4/22; the patient is making decent progress on his wound on the left lateral calf however he arrives in  with a superficial wound on the right anterior tibial area. Were not really sure how this happened. He had been using a stocking on the right leg. He has been using silver collagen on the wounds Amazing to me he is he appears to have compression pumps at home. I am not really sure I knew this before. In any case he has not been using them 4/29; left lateral calf wound continues to get gradually smaller. This looks like it is on its way to closure. oThe area on the right anterior tibial area is about the same in terms of size superficial without any depth. This was probably wrap injury oHe tells me that he has been using his compression pumps once a day for 1 hour without any pain 5/6; not as good today. Left lateral calf wound is actually larger and he does not have quite as good edema control. The area on the right anterior tibia is about the same still superficial. He states he is using his compression pumps once a day. We are using 3 layer compression on the left and Curlex Coban on the right 5/13; the area on the right is very close to being healed. The area on the left still has some depth but has a healthy wound surface. He has excellent edema control bilaterally. Using Hydrofera Blue 5/20-Patient returns  in 1 week after being in 3 layer compression on the left, the left lateral calf ulcer is stable with some slough, the right anterior tibial ulcer is healed we have been using Kerlix Coban on that leg 5/27; Hydrofera Blue and 3 layer compression. Still some slough in the wound area and some nonviable edges around the wound. 6/3; this patient has lymphedema and chronic venous insufficiency. He had difficult to close wound on his left lateral calf. This is closed today. 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 Finally he has known PAD but he is managed to tolerate the compression we reporting on him and also using his compression pumps that he already  had at home twice a day 6/10; we discharge this patient last week having finally close the small area in the left lateral calf. Apparently by Friday of last week this had opened and was draining. He is therefore back in clinic. His wife who is present also was concerned about an area on the right anterior tibia."o 6/17; the patient does not have an open area on the right leg. On the left he has a small circular area anteriorly and the same area laterally that he had last time. However both of these appear to be improved he has his Farrow wrap 30/40. It turns out that he is only using his external compression pumps once a day. I tried to get him up to twice a day today 6/24; the patient comes in with his external compression garment on the right leg/Farrow wrap. As far as we know no open wounds here. He has 2 areas that are smaller one on the left anterior pretibial and one on the left lateral calf which is the site of his initial wounds. I have successfully caught him into his external compression pumps twice a day 7/1; still using his external compression garment on the right leg. As far as we know there are no open wounds here. The area on the left lateral calf which was the site of his initial wound has closed over. The area on the left anterior pretibial that we identified last week is still open. 7/8; as far as we know he has no open wounds on the right leg. He has his external compression garment on here. The original wound on the left lateral calf has closed over. He developed an area on the left anterior pretibial 2 weeks ago and seems to have 2 smaller areas on the proximal and distal lateral calf. We do not have his good edema control this week as I am used to seeing 7/15; the patient has still a linear area on the left anterior tibia. The area on the left lateral leg looked somewhat inflamed today but no cellulitis. He did have what looked to be a small pustule that I cultured for CandS  although I did not give him empiric antibiotics. Electronic Signature(s) MESSIYAH, WATERSON ([id number]) Signed: 11/15/2018 4:31:45 PM By: Linton Ham MD Entered By: Linton Ham on 11/15/2018 15:57:55 Steven Meza ([id number]) -------------------------------------------------------------------------------- Physician Orders Details Patient Name: Steven Meza Date of Service: 11/15/2018 1:00 PM Medical Record Number: [id number] Patient Account Number: [id number] Date of Birth/Sex: 1930/06/11 (85 y.o. M) Treating RN: Cornell Barman Primary Care Provider: Fulton Reek Other Clinician: Referring Provider: Fulton Reek Treating Provider/Extender: Tito Dine in Treatment: 32 Verbal / Phone Orders: No Diagnosis Coding Wound Cleansing Wound #1 Left,Lateral Lower Leg o Dial antibacterial soap, wash wounds,  rinse and pat dry prior to dressing wounds Wound #3 Left,Medial Lower Leg o Dial antibacterial soap, wash wounds, rinse and pat dry prior to dressing wounds Skin Barriers/Peri-Wound Care Wound #1 Left,Lateral Lower Leg o Triamcinolone Acetonide Ointment (TCA) Wound #3 Left,Medial Lower Leg o Triamcinolone Acetonide Ointment (TCA) Primary Wound Dressing Wound #1 Left,Lateral Lower Leg o Silver Alginate Wound #3 Left,Medial Lower Leg o Silver Alginate Secondary Dressing Wound #1 Left,Lateral Lower Leg o ABD pad Wound #3 Left,Medial Lower Leg o ABD pad Dressing Change Frequency Wound #1 Left,Lateral Lower Leg o Change dressing every week - In office Wound #3 Left,Medial Lower Leg o Change dressing every week - In office Follow-up Appointments Wound #1 Left,Lateral Lower Leg o Return Appointment in 1 week. Wound #3 Left,Medial Lower Leg o Return Appointment in 1 week. Edema Control Steven Meza, Steven Meza ([id number]) Wound #1 Left,Lateral Lower Leg o 3 Layer Compression System - Left Lower Extremity o Patient to wear own Velcro  compression garment. - Right o Compression Pump: Use compression pump on left lower extremity for 60 minutes, twice daily. o Compression Pump: Use compression pump on right lower extremity for 60 minutes, twice daily. Wound #3 Left,Medial Lower Leg o 3 Layer Compression System - Left Lower Extremity o Patient to wear own Velcro compression garment. - Right o Compression Pump: Use compression pump on left lower extremity for 60 minutes, twice daily. o Compression Pump: Use compression pump on right lower extremity for 60 minutes, twice daily. Off-Loading Wound #1 Left,Lateral Lower Leg o Other: - Do not sleep in recliner, sleep in bed with legs elevated. Wound #3 Left,Medial Lower Leg o Other: - Do not sleep in recliner, sleep in bed with legs elevated. Laboratory o Bacteria identified in Wound by Culture (MICRO) - Left lower leg oooo LOINC Meza: [id number] oooo Convenience Name: Wound culture routine Electronic Signature(s) Signed: 11/15/2018 4:20:22 PM By: Gretta Cool, BSN, RN, CWS, Kim RN, BSN Signed: 11/15/2018 4:31:45 PM By: Linton Ham MD Entered By: Gretta Cool, BSN, RN, CWS, Kim on 11/15/2018 13:30:53 Steven Meza ([id number]) -------------------------------------------------------------------------------- Problem List Details Patient Name: Steven Meza Date of Service: 11/15/2018 1:00 PM Medical Record Number: [id number] Patient Account Number: [id number] Date of Birth/Sex: June 17, 1930 (85 y.o. 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 M) Treating RN: Cornell Barman Primary Care Provider: Fulton Reek Other Clinician: Referring Provider: Fulton Reek Treating Provider/Extender: Tito Dine in Treatment: 26 Subjective History of Present Illness (HPI) ADMISSION 05/17/2018 Mr. Lenoir is an 85 year old man who is been treated at the lymphedema clinic for a long period with lymphedema wraps for bilateral lower extremity lymphedema. About 6 months ago they noted small open areas on his left lateral calf just above  the ankle. These were open. They are apparently putting some form of ointment on this. They have been referred here for our review of this. The patient has a long history of lymphedema and venous stasis. It says in his records that he has recurrent cellulitis although his wife denies this but she states that the lymphedema may have started with cellulitis several years ago. Also in his record there is a history of bladder cancer which they seem to know very little about however he did not have any radiation to the pelvis or anything that could have contributed to lymphedema that they are aware of. There is no prior wound history. The patient has 2 small punched out areas on the left lateral calf that have adherent debris on the surface. Past medical history; lymphedema,  hypertension, cellulitis, hearing loss, morbid obesity, osteoarthritis, venous stasis, bladder CA, diverticulosis. ABIs in our clinic were 0.36 on the right and 0.57 on the left 05/24/2018; corrected age on this patient is 52 versus what I said last week. He has been for his arterial studies which predictably are not very good. On the right his ABI is 0.65. Monophasic waveforms noted at the right ankle including the posterior tibial and dorsalis pedis. He has triphasic waveforms of the common femoral and profunda femoris triphasic proximal SFA with monophasic distal FSA and popliteal artery. Monophasic tibial waveforms. On the left his ABI is 0.58 monophasic waveforms at the left ankle. Duplex of the left lower extremity demonstrated atherosclerotic change with monophasic waveforms throughout the left lower extremity. There was some concern about left iliac disease and potentially femoral-popliteal and tibial disease. The patient does not describe claudication although according to his wife he over emphasizes his activity. Patient states he is limited by pain in both knees His wounds are on the left lateral calf. 2 small areas with necrotic debris. We have been using silver collagen and light Ace wraps 05/31/2018; wounds on the left lateral calf in the setting of very significant lymphedema and probably PAD. We have been using silver collagen. The base of the small wound looks reasonably improved. I sent him to see Dr. Fletcher Anon however I see now he has an appointment with Dr. Alvester Chou on February 12 2/5; left lateral calf wound looks the same. His wife is doing a good job with her lymphedema wraps and maintaining that the edema. He most likely has PAD and is due to see Dr. Gwenlyn Found of infectious disease on February 12 2/19; left lateral calf wounds look about the same. This looks like wound secondary to chronic venous insufficiency with lymphedema however the patient has very poor arterial studies. We  referred him to Dr. Gwenlyn Found. Dr. Gwenlyn Found did not feel he needed to do anything from an arterial point of view. He did not address the question about the aggressiveness of compression. After some thoughts about this I elected to go ahead and put him in 3 layer compression which after all would be less compression then the lymphedema clinic was putting on this. I am hopeful that this should be enough to get some closure of the small wounds 2/26; left lateral calf wound letter this week. He tolerated the 3 layer compression well furthermore he is sleeping in a hospital bed which helps keep his legs up at night. The wound looks better measuring smaller especially in width 3/3; left lateral calf wound about the same size. The 3 layer compression has really helped get the edema down in his leg however. Using  silver collagen 3/11; left lateral calf wound better looking wound surface but about the same size. We have been using 3 layer compression which is really helped get the edema down in the left leg. Even after Dr. Kennon Holter assessment I am reluctant to go to 4 layer Steven Meza, Steven A. ([id number]) compression. He is tolerating 3 layer compression well. We have been using silver collagen 3/18-Patient returns for his left lateral calf wound which overall is looking better, slightly increased in size and dimensions and area, he is tolerating the 3 layer compression, has been dressed with silver collagen which we will continue 3/25 left lateral calf wound much better looking. Size is smaller. He has been using silver collagen 4/1; gradually getting smaller in size. Using silver collagen with 3 layer compression. I think the patient has some degree of PAD. He saw Dr. Gwenlyn Found in consultation, he did not specifically comment on whether he could tolerate 4 layer compression 4/8; the wound is slightly smaller in depth. We are using 3 layer compression with silver collagen. I had him seen for arterial insufficiency  however his ABI was 0.57 in the left leg. Dr. Gwenlyn Found really did not seem to middle on the degree of compression he could tolerate 4/15; the wound is slightly smaller and slightly less deep. We have been using 3 layer compression with silver collagen. There is not an option for 4-layer compression here. 4/22; the patient is making decent progress on his wound on the left lateral calf however he arrives in with a superficial wound on the right anterior tibial area. Were not really sure how this happened. He had been using a stocking on the right leg. He has been using silver collagen on the wounds Amazing to me he is he appears to have compression pumps at home. I am not really sure I knew this before. In any case he has not been using them 4/29; left lateral calf wound continues to get gradually smaller. This looks like it is on its way to closure. The area on the right anterior tibial area is about the same in terms of size superficial without any depth. This was probably wrap injury He tells me that he has been using his compression pumps once a day for 1 hour without any pain 5/6; not as good today. Left lateral calf wound is actually larger and he does not have quite as good edema control. The area on the right anterior tibia is about the same still superficial. He states he is using his compression pumps once a day. We are using 3 layer compression on the left and Curlex Coban on the right 5/13; the area on the right is very close to being healed. The area on the left still has some depth but has a healthy wound surface. He has excellent edema control bilaterally. Using Hydrofera Blue 5/20-Patient returns in 1 week after being in 3 layer compression on the left, the left lateral calf ulcer is stable with some slough, the right anterior tibial ulcer is healed we have been using Kerlix Coban on that leg 5/27; Hydrofera Blue and 3 layer compression. Still some slough in the wound area and some  nonviable edges around the wound. 6/3; this patient has lymphedema and chronic venous insufficiency. He had difficult to close wound on his left lateral calf. This is closed today. 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 Finally he has known PAD but he is managed to tolerate the  compression we reporting on him and also using his compression pumps that he already had at home twice a day 6/10; we discharge this patient last week having finally close the small area in the left lateral calf. Apparently by Friday of last week this had opened and was draining. He is therefore back in clinic. His wife who is present also was concerned about an area on the right anterior tibia."o 6/17; the patient does not have an open area on the right leg. On the left he has a small circular area anteriorly and the same area laterally that he had last time. However both of these appear to be improved he has his Farrow wrap 30/40. It turns out that he is only using his external compression pumps once a day. I tried to get him up to twice a day today 6/24; the patient comes in with his external compression garment on the right leg/Farrow wrap. As far as we know no open wounds here. He has 2 areas that are smaller one on the left anterior pretibial and one on the left lateral calf which is the site of his initial wounds. I have successfully caught him into his external compression pumps twice a day 7/1; still using his external compression garment on the right leg. As far as we know there are no open wounds here. The area on the left lateral calf which was the site of his initial wound has closed over. The area on the left anterior pretibial that we identified last week is still open. 7/8; as far as we know he has no open wounds on the right leg. He has his external compression garment on here. The original wound on the left lateral calf has closed over. He developed an area on the  left anterior pretibial 2 weeks ago and seems to have 2 smaller areas on the proximal and distal lateral calf. We do not have his good edema control this week as I am used to seeing 7/15; the patient has still a linear area on the left anterior tibia. The area on the left lateral leg looked somewhat inflamed today but no cellulitis. He did have what looked to be a small pustule that I cultured for CandS although I did not give him empiric antibiotics. Steven Meza, Steven Meza ([id number]) Objective Constitutional Vitals Time Taken: 1:02 PM, Height: 71 in, Weight: 252 lbs, BMI: 35.1, Temperature: 98.8 F, Pulse: 85 bpm, Respiratory Rate: 16 breaths/min, Blood Pressure: 162/53 mmHg. Integumentary (Hair, Skin) Wound #1 status is Open. Original cause of wound was Gradually Appeared. The wound is located on the Left,Lateral Lower Leg. The wound measures 1cm length x 1.2cm width x 0.1cm depth; 0.942cm^2 area and 0.094cm^3 volume. There is Fat Layer (Subcutaneous Tissue) Exposed exposed. There is no tunneling or undermining noted. There is a medium amount of serous drainage noted. The wound margin is flat and intact. There is medium (34-66%) red granulation within the wound bed. There is a medium (34-66%) amount of necrotic tissue within the wound bed including Adherent Slough. Wound #3 status is Open. Original cause of wound was Blister. The wound is located on the Left,Medial Lower Leg. The wound measures 1cm length x 1.1cm width x 0.1cm depth; 0.864cm^2 area and 0.086cm^3 volume. There is Fat Layer (Subcutaneous Tissue) Exposed exposed. There is no tunneling or undermining noted. There is a medium amount of serous drainage noted. The wound margin is flat and intact. There is medium (34-66%) red granulation within the wound bed. There  is a medium (34-66%) amount of necrotic tissue within the wound bed including Adherent Slough. 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 Plan Wound Cleansing: Wound #1 Left,Lateral Lower Leg: Dial antibacterial soap, wash wounds, rinse and pat dry prior to dressing wounds Wound #3 Left,Medial Lower Leg: Dial antibacterial soap, wash wounds, rinse and pat dry prior to dressing wounds Skin Barriers/Peri-Wound Care: Wound #1 Left,Lateral Lower Leg: Triamcinolone Acetonide Ointment (TCA) Wound #3 Left,Medial Lower Leg: Triamcinolone Acetonide Ointment (TCA) Primary Wound Dressing: Wound #1 Left,Lateral Lower Leg: Silver Alginate Wound #3 Left,Medial Lower Leg: Silver Alginate Steven Meza, Steven Meza ([id number]) Secondary Dressing: Wound #1 Left,Lateral Lower Leg: ABD pad Wound #3 Left,Medial Lower Leg: ABD pad Dressing Change Frequency: Wound #1 Left,Lateral Lower Leg: Change dressing every week - In office Wound #3 Left,Medial Lower Leg: Change dressing every week - In office Follow-up Appointments: Wound #1 Left,Lateral Lower Leg: Return Appointment in 1 week. Wound #3 Left,Medial Lower Leg: Return Appointment in 1 week. Edema Control: Wound #1 Left,Lateral Lower Leg: 3 Layer Compression System - Left Lower Extremity Patient to wear own Velcro compression garment. - Right Compression Pump: Use compression pump on left lower extremity for 60 minutes, twice daily. Compression Pump: Use compression pump on right lower extremity for 60 minutes, twice daily. Wound #3 Left,Medial Lower Leg: 3 Layer Compression System - Left Lower Extremity Patient to wear own Velcro compression garment. - Right Compression Pump: Use compression pump on left lower extremity for 60 minutes, twice daily. Compression Pump: Use compression pump on right lower extremity for 60 minutes, twice  daily. Off-Loading: Wound #1 Left,Lateral Lower Leg: Other: - Do not sleep in recliner, sleep in bed with legs elevated. Wound #3 Left,Medial Lower Leg: Other: - Do not sleep in recliner, sleep in bed with legs elevated. Laboratory ordered were: Wound culture routine - Left lower leg 1. Triamcinolone to the left leg 2. Silver alginate to the area anteriorly laterally 3. I did a culture of the small pustule or perhaps an epidermoid cyst 4. No empiric antibiotics 5. The patient leaves his legs dangling down in recliner at night although he says he is trying to hold them up better. 6. Originally I told this patient's wife that he would need to go back to the lymphedema clinic although I think it is quite possible he may his skin on the left leg is simply too fragile secondary to longstanding poorly controlled lymphedema and inflammation/stasis dermatitis Electronic Signature(s) Signed: 11/15/2018 4:31:45 PM By: Linton Ham MD Entered By: Linton Ham on 11/15/2018 16:00:11 Steven Meza ([id number]) -------------------------------------------------------------------------------- SuperBill Details Patient Name: Steven Meza Date of Service: 11/15/2018 Medical Record Number: [id number] Patient Account Number: [id number] Date of Birth/Sex: 06/04/1930 (85 y.o. M) Treating RN: Cornell Barman Primary Care Provider: Fulton Reek Other Clinician: Referring Provider: Fulton Reek Treating Provider/Extender: Tito Dine in Treatment: 26 Diagnosis Coding ICD-10 Codes Meza Description [phone] Non-pressure chronic ulcer of left calf with other specified severity I89.0 Lymphedema, not elsewhere classified I70.212 Atherosclerosis of native arteries of extremities with intermittent claudication, left leg L97.211 Non-pressure chronic ulcer of right calf limited to breakdown of skin Facility Procedures CPT4 Meza: [id number] Description: (Facility Use Only) 29581LT - [facility] LWR LT LEG Modifier: Quantity: 1 Physician Procedures CPT4 Meza: [id number] Description: 90240 -  WC PHYS LEVEL 3 - EST PT ICD-10 Diagnosis Description L97.228 Non-pressure chronic ulcer of left calf with other specified se I89.0 Lymphedema, not elsewhere classified Modifier: verity Quantity: 1 Electronic Signature(s) Signed: 11/15/2018 4:31:45 PM By: Linton Ham MD Entered By: Linton Ham on 11/15/2018 16:00:36

## 2018-11-16 NOTE — Progress Notes (Signed)
Steven, Meza ([id number]) Visit Report for 11/15/2018 Arrival Information Details Patient Name: Steven Meza, Steven Meza Date of Service: 11/15/2018 1:00 PM Medical Record Number: [id number] Patient Account Number: [id number] Date of Birth/Sex: 1930/05/17 (85 y.o. M) Treating RN: Army Melia Primary Care Copelyn Widmer: Fulton Reek Other Clinician: Referring Jerzy Crotteau: Fulton Reek Treating Emmauel Hallums/Extender: Tito Dine in Treatment: 26 Visit Information History Since Last Visit Added or deleted any medications: No Patient Arrived: Cane Any new allergies or adverse reactions: No Arrival Time: 13:02 Had a fall or experienced change in No Accompanied By: self activities of daily living that may affect Transfer Assistance: None risk of falls: Signs or symptoms of abuse/neglect since last visito No Hospitalized since last visit: No Has Dressing in Place as Prescribed: Yes Pain Present Now: No Electronic Signature(s) Signed: 11/15/2018 3:27:20 PM By: Army Melia Entered By: Army Melia on 11/15/2018 13:02:43 Steven Meza ([id number]) -------------------------------------------------------------------------------- Encounter Discharge Information Details Patient Name: Steven Meza Date of Service: 11/15/2018 1:00 PM Medical Record Number: [id number] Patient Account Number: [id number] Date of Birth/Sex: 22-Jul-1930 (85 y.o. M) Treating RN: Army Melia Primary Care Izadora Roehr: Fulton Reek Other Clinician: Referring Elison Worrel: Fulton Reek Treating Rilya Longo/Extender: Tito Dine in Treatment: 4 Encounter Discharge Information Items Discharge Condition: Stable Ambulatory Status: Cane Discharge Destination: Home Transportation: Private Auto Accompanied By: self Schedule Follow-up Appointment: Yes Clinical Summary of Care: Electronic Signature(s) Signed: 11/15/2018 3:27:20 PM By: Army Melia Entered By: Army Melia on 11/15/2018 13:45:47 Steven Meza ([id number]) -------------------------------------------------------------------------------- Lower Extremity Assessment Details Patient Name: Steven Meza Date of Service: 11/15/2018 1:00 PM Medical Record Number: [id number] Patient Account Number: [id number] Date of Birth/Sex: 01-20-1931 (85 y.o. M) Treating RN: Army Melia Primary Care Estefany Goebel: Fulton Reek Other Clinician: Referring Breanne Olvera: Fulton Reek Treating Theoren Palka/Extender: Ricard Dillon Weeks in Treatment: 26 Edema Assessment Assessed: [Left: No] [Right: No] Edema: [Left: N] [Right: o] Vascular Assessment Pulses: Dorsalis Pedis Palpable: [Left:Yes] Electronic Signature(s) Signed: 11/15/2018 3:27:20 PM By: Army Melia Entered By: Army Melia on 11/15/2018 13:10:22 Steven Meza ([id number]) -------------------------------------------------------------------------------- Multi Wound Chart Details Patient Name: Steven Meza Date of Service: 11/15/2018 1:00 PM Medical Record Number: [id number] Patient Account Number: [id number] Date of Birth/Sex: 04-21-1931 (85 y.o. M) Treating RN: Cornell Barman Primary Care Catrell Morrone: Fulton Reek Other Clinician: Referring Nakenya Theall: Fulton Reek Treating Jaslene Marsteller/Extender: Tito Dine in Treatment: 26 Vital Signs Height(in): 71 Pulse(bpm): 85 Weight(lbs): 252 Blood Pressure(mmHg): 162/53 Body Mass Index(BMI): 35 Temperature(F): 98.8 Respiratory Rate 16 (breaths/min): Photos: [N/A:N/A] Wound Location: Left Lower Leg - Lateral Left Lower Leg - Medial N/A Wounding Event: Gradually Appeared Blister N/A Primary Etiology: Lymphedema Venous Leg Ulcer N/A Comorbid History: Lymphedema, Coronary Artery Lymphedema, Coronary Artery N/A Disease, Hypertension Disease, Hypertension Date Acquired: 11/14/2017 10/10/2018 N/A Weeks of Treatment: 26 5 N/A Wound Status: Open Open N/A Clustered Wound: Yes No N/A Measurements L x W x D 1x1.2x0.1  1x1.1x0.1 N/A (cm) Area (cm) : 0.942 0.864 N/A Volume (cm) : 0.094 0.086 N/A % Reduction in Area: 70.00% 15.40% N/A % Reduction in Volume: 70.10% 15.70% N/A Classification: Partial Thickness Partial Thickness N/A Exudate Amount: Medium Medium N/A Exudate Type: Serous Serous N/A Exudate Color: amber amber N/A Wound Margin: Flat and Intact Flat and Intact N/A Granulation Amount: Medium (34-66%) Medium (34-66%) N/A Granulation Quality: Red Red N/A Necrotic Amount: Medium (34-66%) Medium (34-66%) N/A Exposed Structures: Fat Layer (Subcutaneous Fat Layer (Subcutaneous N/A Tissue) Exposed: Yes Tissue) Exposed: Yes Fascia: No Fascia: No Tendon: No Tendon: No Muscle:  No Muscle: No MAXIMILIANO, CROMARTIE ([id number]) Joint: No Joint: No Bone: No Bone: No Epithelialization: None Medium (34-66%) N/A Treatment Notes Wound #1 (Left, Lateral Lower Leg) Notes silver ag, ABD, 3 layer Wound #3 (Left, Medial Lower Leg) Notes silver ag, ABD, 3 layer Electronic Signature(s) Signed: 11/15/2018 4:31:45 PM By: Linton Ham MD Entered By: Linton Ham on 11/15/2018 15:57:13 Steven Meza ([id number]) -------------------------------------------------------------------------------- Multi-Disciplinary Care Plan Details Patient Name: Steven Meza Date of Service: 11/15/2018 1:00 PM Medical Record Number: [id number] Patient Account Number: [id number] Date of Birth/Sex: June 22, 1930 (85 y.o. M) Treating RN: Cornell Barman Primary Care Dalia Jollie: Fulton Reek Other Clinician: Referring Rolla Servidio: Fulton Reek Treating Arnett Galindez/Extender: Tito Dine in Treatment: 16 Active Inactive Orientation to the Wound Care Program Nursing Diagnoses: Knowledge deficit related to the wound healing center program Goals: Patient/caregiver will verbalize understanding of the [place] Date Initiated: 05/17/2018 Target Resolution Date: 06/17/2018 Goal Status:  Active Interventions: Provide education on orientation to the wound center Notes: Venous Leg Ulcer Nursing Diagnoses: Potential for venous Insuffiency (use before diagnosis confirmed) Goals: Patient/caregiver will verbalize understanding of disease process and disease management Date Initiated: 05/17/2018 Target Resolution Date: 06/17/2018 Goal Status: Active Interventions: Assess peripheral edema status every visit. Treatment Activities: Non-invasive vascular studies : 05/17/2018 Notes: Wound/Skin Impairment Nursing Diagnoses: Impaired tissue integrity Goals: Patient/caregiver will verbalize understanding of skin care regimen Date Initiated: 05/17/2018 Target Resolution Date: 05/17/2018 Goal Status: Active SHMIEL, MORTON ([id number]) Ulcer/skin breakdown will heal within 14 weeks Date Initiated: 05/17/2018 Target Resolution Date: 08/16/2018 Goal Status: Active Interventions: Assess ulceration(s) every visit Treatment Activities: Referred to DME Raha Tennison for dressing supplies : 05/17/2018 Topical wound management initiated : 05/17/2018 Notes: Electronic Signature(s) Signed: 11/15/2018 4:20:22 PM By: Gretta Cool, BSN, RN, CWS, Kim RN, BSN Entered By: Gretta Cool, BSN, RN, CWS, Kim on 11/15/2018 13:27:33 Steven Meza ([id number]) -------------------------------------------------------------------------------- Pain Assessment Details Patient Name: Steven Meza Date of Service: 11/15/2018 1:00 PM Medical Record Number: [id number] Patient Account Number: [id number] Date of Birth/Sex: [birth year] (85 y.o. M) Treating RN: Army Melia Primary Care Derrion Tritz: Fulton Reek Other Clinician: Referring Jakerria Kingbird: Fulton Reek Treating Sanaia Jasso/Extender: Tito Dine in Treatment: 26 Active Problems Location of Pain Severity and Description of Pain Patient Has Paino No Site Locations Pain Management and Medication Current Pain Management: Electronic Signature(s) Signed:  11/15/2018 3:27:20 PM By: Army Melia Entered By: Army Melia on 11/15/2018 13:02:49 Steven Meza ([id number]) -------------------------------------------------------------------------------- Patient/Caregiver Education Details Patient Name: Steven Meza Date of Service: 11/15/2018 1:00 PM Medical Record Number: [id number] Patient Account Number: [id number] Date of Birth/Gender: 10-02-1930 (85 y.o. M) Treating RN: Cornell Barman Primary Care Physician: Fulton Reek Other Clinician: Referring Physician: Fulton Reek Treating Physician/Extender: Tito Dine in Treatment: 13 Education Assessment Education Provided To: Patient Education Topics Provided Wound/Skin Impairment: Handouts: Caring for Your Ulcer Methods: Demonstration, Explain/Verbal Responses: State content correctly Electronic Signature(s) Signed: 11/15/2018 4:20:22 PM By: Gretta Cool, BSN, RN, CWS, Kim RN, BSN Entered By: Gretta Cool, BSN, RN, CWS, Kim on 11/15/2018 13:31:12 Steven Meza ([id number]) -------------------------------------------------------------------------------- Wound Assessment Details Patient Name: Steven Meza Date of Service: 11/15/2018 1:00 PM Medical Record Number: [id number] Patient Account Number: [id number] Date of Birth/Sex: [birth year] (85 y.o. M) Treating RN: Army Melia Primary Care Rebecca Motta: Fulton Reek Other Clinician: Referring Dagon Budai: Fulton Reek Treating Salley Boxley/Extender: Tito Dine in Treatment: 26 Wound Status Wound Number: 1 Primary Lymphedema Etiology: Wound Location: Left Lower Leg - Lateral Wound Status: Open Wounding Event: Gradually Appeared Comorbid  Lymphedema, Coronary Artery Disease, Date Acquired: 11/14/2017 History: Hypertension Weeks Of Treatment: 26 Clustered Wound: Yes Photos Wound Measurements Length: (cm) 1 Width: (cm) 1.2 Depth: (cm) 0.1 Area: (cm) 0.942 Volume: (cm) 0.094 % Reduction in Area: 70% % Reduction  in Volume: 70.1% Epithelialization: None Tunneling: No Undermining: No Wound Description Classification: Partial Thickness Foul Odor Af Wound Margin: Flat and Intact Slough/Fibri Exudate Amount: Medium Exudate Type: Serous Exudate Color: amber ter Cleansing: No no Yes Wound Bed Granulation Amount: Medium (34-66%) Exposed Structure Granulation Quality: Red Fascia Exposed: No Necrotic Amount: Medium (34-66%) Fat Layer (Subcutaneous Tissue) Exposed: Yes Necrotic Quality: Adherent Slough Tendon Exposed: No Muscle Exposed: No Joint Exposed: No Bone Exposed: No Treatment Notes [name], VILLARD ([id number]) Wound #1 (Left, Lateral Lower Leg) Notes silver ag, ABD, 3 layer Electronic Signature(s) Signed: 11/15/2018 3:27:20 PM By: Army Melia Entered By: Army Melia on 11/15/2018 13:09:28 Steven Meza ([id number]) -------------------------------------------------------------------------------- Wound Assessment Details Patient Name: Steven Meza Date of Service: 11/15/2018 1:00 PM Medical Record Number: [id number] Patient Account Number: [id number] Date of Birth/Sex: 27-May-1930 (85 y.o. M) Treating RN: Army Melia Primary Care Creola Krotz: Fulton Reek Other Clinician: Referring Kennisha Qin: Fulton Reek Treating Deklin Bieler/Extender: Tito Dine in Treatment: 26 Wound Status Wound Number: 3 Primary Venous Leg Ulcer Etiology: Wound Location: Left Lower Leg - Medial Wound Status: Open Wounding Event: Blister Comorbid Lymphedema, Coronary Artery Disease, Date Acquired: 10/10/2018 History: Hypertension Weeks Of Treatment: 5 Clustered Wound: No Photos Wound Measurements Length: (cm) 1 % Reduction i Width: (cm) 1.1 % Reduction i Depth: (cm) 0.1 Epithelializa Area: (cm) 0.864 Tunneling: Volume: (cm) 0.086 Undermining: n Area: 15.4% n Volume: 15.7% tion: Medium (34-66%) No No Wound Description Classification: Partial Thickness Foul Odor Aft Wound  Margin: Flat and Intact Slough/Fibrin Exudate Amount: Medium Exudate Type: Serous Exudate Color: amber er Cleansing: No o No Wound Bed Granulation Amount: Medium (34-66%) Exposed Structure Granulation Quality: Red Fascia Exposed: No Necrotic Amount: Medium (34-66%) Fat Layer (Subcutaneous Tissue) Exposed: Yes Necrotic Quality: Adherent Slough Tendon Exposed: No Muscle Exposed: No Joint Exposed: No Bone Exposed: No Treatment Notes ARAN, MENNING ([id number]) Wound #3 (Left, Medial Lower Leg) Notes silver ag, ABD, 3 layer Electronic Signature(s) Signed: 11/15/2018 3:27:20 PM By: Army Melia Entered By: Army Melia on 11/15/2018 13:10:02 Steven Meza ([id number]) -------------------------------------------------------------------------------- Vitals Details Patient Name: Steven Meza Date of Service: 11/15/2018 1:00 PM Medical Record Number: [id number] Patient Account Number: [id number] Date of Birth/Sex: [birth year] (85 y.o. M) Treating RN: Army Melia Primary Care Kelby Adell: Fulton Reek Other Clinician: Referring Kairav Russomanno: Fulton Reek Treating Georgianne Gritz/Extender: Tito Dine in Treatment: 26 Vital Signs Time Taken: 13:02 Temperature (F): 98.8 Height (in): 71 Pulse (bpm): 85 Weight (lbs): 252 Respiratory Rate (breaths/min): 16 Body Mass Index (BMI): 35.1 Blood Pressure (mmHg): 162/53 Reference Range: 80 - 120 mg / dl Electronic Signature(s) Signed: 11/15/2018 3:27:20 PM By: Army Melia Entered By: Army Melia on 11/15/2018 13:03:21

## 2018-11-18 LAB — AEROBIC CULTURE W GRAM STAIN (SUPERFICIAL SPECIMEN)

## 2018-11-22 ENCOUNTER — Encounter: Payer: Medicare Other | Admitting: Internal Medicine

## 2018-11-22 ENCOUNTER — Other Ambulatory Visit: Payer: Self-pay

## 2018-11-22 DIAGNOSIS — I890 Lymphedema, not elsewhere classified: Secondary | ICD-10-CM | POA: Diagnosis not present

## 2018-11-22 NOTE — Progress Notes (Signed)
Steven Meza, Steven Meza ([id number]) Visit Report for 11/22/2018 HPI Details Patient Name: Steven Meza, Steven Meza Date of Service: 11/22/2018 1:00 PM Medical Record Number: [id number] Patient Account Number: [id number] Date of Birth/Sex: 05/07/1930 (85 y.o. M) Treating RN: Primary Care Provider: Fulton Reek Other Clinician: Referring Provider: Fulton Reek Treating Provider/Extender: Tito Dine in Treatment: 3 History of Present Illness HPI Description: ADMISSION 05/17/2018 Mr. Satz is an 85 year old man who is been treated at the lymphedema clinic for a long period with lymphedema wraps for bilateral lower extremity lymphedema. About 6 months ago they noted small open areas on his left lateral calf just above the ankle. These were open. They are apparently putting some form of ointment on this. They have been referred here for our review of this. The patient has a long history of lymphedema and venous stasis. It says in his records that he has recurrent cellulitis although his wife denies this but she states that the lymphedema may have started with cellulitis several years ago. Also in his record there is a history of bladder cancer which they seem to know very little about however he did not have any radiation to the pelvis or anything that could have contributed to lymphedema that they are aware of. There is no prior wound history. The patient has 2 small punched out areas on the left lateral calf that have adherent debris on the surface. Past medical history; lymphedema, hypertension, cellulitis, hearing loss, morbid obesity, osteoarthritis, venous stasis, bladder CA, diverticulosis. ABIs in our clinic were 0.36 on the right and 0.57 on the left 05/24/2018; corrected age on this patient is 77 versus what I said last week. He has been for his arterial studies which predictably are not very good. On the right his ABI is 0.65. Monophasic waveforms noted at the right ankle including  the posterior tibial and dorsalis pedis. He has triphasic waveforms of the common femoral and profunda femoris triphasic proximal SFA with monophasic distal FSA and popliteal artery. Monophasic tibial waveforms. On the left his ABI is 0.58 monophasic waveforms at the left ankle. Duplex of the left lower extremity demonstrated atherosclerotic change with monophasic waveforms throughout the left lower extremity. There was some concern about left iliac disease and potentially femoral-popliteal and tibial disease. The patient does not describe claudication although according to his wife he over emphasizes his activity. Patient states he is limited by pain in both knees His wounds are on the left lateral calf. 2 small areas with necrotic debris. We have been using silver collagen and light Ace wraps 05/31/2018; wounds on the left lateral calf in the setting of very significant lymphedema and probably PAD. We have been using silver collagen. The base of the small wound looks reasonably improved. I sent him to see Dr. Fletcher Anon however I see now he has an appointment with Dr. Alvester Chou on February 12 2/5; left lateral calf wound looks the same. His wife is doing a good job with her lymphedema wraps and maintaining that the edema. He most likely has PAD and is due to see Dr. Gwenlyn Found of infectious disease on February 12 2/19; left lateral calf wounds look about the same. This looks like wound secondary to chronic venous insufficiency with lymphedema however the patient has very poor arterial studies. We referred him to Dr. Gwenlyn Found. Dr. Gwenlyn Found did not feel he needed to do anything from an arterial point of view. He did not address the question about the aggressiveness of compression. After some thoughts about this I  elected to go ahead and put him in 3 layer compression which after all would be less compression then the lymphedema clinic was putting on this. I am hopeful that this should be enough to get some closure of  the small wounds 2/26; left lateral calf wound letter this week. He tolerated the 3 layer compression well furthermore he is sleeping in a hospital bed which helps keep his legs up at night. The wound looks better measuring smaller especially in width 3/3; left lateral calf wound about the same size. The 3 layer compression has really helped get the edema down in his leg Steven Meza, Steven A. ([id number]) however. Using silver collagen 3/11; left lateral calf wound better looking wound surface but about the same size. We have been using 3 layer compression which is really helped get the edema down in the left leg. Even after Dr. Kennon Holter assessment I am reluctant to go to 4 layer compression. He is tolerating 3 layer compression well. We have been using silver collagen 3/18-Patient returns for his left lateral calf wound which overall is looking better, slightly increased in size and dimensions and area, he is tolerating the 3 layer compression, has been dressed with silver collagen which we will continue 3/25oleft lateral calf wound much better looking. Size is smaller. He has been using silver collagen 4/1; gradually getting smaller in size. Using silver collagen with 3 layer compression. I think the patient has some degree of PAD. He saw Dr. Gwenlyn Found in consultation, he did not specifically comment on whether he could tolerate 4 layer compression 4/8; the wound is slightly smaller in depth. We are using 3 layer compression with silver collagen. I had him seen for arterial insufficiency however his ABI was 0.57 in the left leg. Dr. Gwenlyn Found really did not seem to middle on the degree of compression he could tolerate 4/15; the wound is slightly smaller and slightly less deep. We have been using 3 layer compression with silver collagen. There is not an option for 4-layer compression here. 4/22; the patient is making decent progress on his wound on the left lateral calf however he arrives in with a superficial  wound on the right anterior tibial area. Were not really sure how this happened. He had been using a stocking on the right leg. He has been using silver collagen on the wounds Amazing to me he is he appears to have compression pumps at home. I am not really sure I knew this before. In any case he has not been using them 4/29; left lateral calf wound continues to get gradually smaller. This looks like it is on its way to closure. oThe area on the right anterior tibial area is about the same in terms of size superficial without any depth. This was probably wrap injury oHe tells me that he has been using his compression pumps once a day for 1 hour without any pain 5/6; not as good today. Left lateral calf wound is actually larger and he does not have quite as good edema control. The area on the right anterior tibia is about the same still superficial. He states he is using his compression pumps once a day. We are using 3 layer compression on the left and Curlex Coban on the right 5/13; the area on the right is very close to being healed. The area on the left still has some depth but has a healthy wound surface. He has excellent edema control bilaterally. Using Hydrofera Blue 5/20-Patient returns in 1  week after being in 3 layer compression on the left, the left lateral calf ulcer is stable with some slough, the right anterior tibial ulcer is healed we have been using Kerlix Coban on that leg 5/27; Hydrofera Blue and 3 layer compression. Still some slough in the wound area and some nonviable edges around the wound. 6/3; this patient has lymphedema and chronic venous insufficiency. He had difficult to close wound on his left lateral calf. This is closed today. 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 Finally he has known PAD but he is managed to tolerate the compression we reporting on him and also using his compression pumps that he already had at home twice a  day 6/10; we discharge this patient last week having finally close the small area in the left lateral calf. Apparently by Friday of last week this had opened and was draining. He is therefore back in clinic. His wife who is present also was concerned about an area on the right anterior tibia."o 6/17; the patient does not have an open area on the right leg. On the left he has a small circular area anteriorly and the same area laterally that he had last time. However both of these appear to be improved he has his Farrow wrap 30/40. It turns out that he is only using his external compression pumps once a day. I tried to get him up to twice a day today 6/24; the patient comes in with his external compression garment on the right leg/Farrow wrap. As far as we know no open wounds here. He has 2 areas that are smaller one on the left anterior pretibial and one on the left lateral calf which is the site of his initial wounds. I have successfully caught him into his external compression pumps twice a day 7/1; still using his external compression garment on the right leg. As far as we know there are no open wounds here. The area on the left lateral calf which was the site of his initial wound has closed over. The area on the left anterior pretibial that we identified last week is still open. 7/8; as far as we know he has no open wounds on the right leg. He has his external compression garment on here. The original wound on the left lateral calf has closed over. He developed an area on the left anterior pretibial 2 weeks ago and seems to have 2 smaller areas on the proximal and distal lateral calf. We do not have his good edema control this week as I am used to seeing 7/15; the patient has still a linear area on the left anterior tibia. The area on the left lateral leg looked somewhat inflamed today but no cellulitis. He did have what looked to be a small pustule that I cultured for CandS although I did not  give him empiric antibiotics. 7/22; small area on the left anterior tibia. The area on the left lateral leg still looks inflamed. The pustule that I cultured last week grew Enterobacter and Pseudomonas. I prescribed Cipro they still have not picked that up. In any case the area actually looks fairly satisfactory. He has decent edema control using his compression pumps twice a day Steven Meza, Steven Meza ([id number]) Electronic Signature(s) Signed: 11/22/2018 4:35:36 PM By: Linton Ham MD Entered By: Linton Ham on 11/22/2018 13:55:09 Steven Meza ([id number]) -------------------------------------------------------------------------------- Physical Exam Details Patient Name: Steven Meza Date of Service: 11/22/2018 1:00 PM Medical  Record Number: [id number] Patient Account Number: [id number] Date of Birth/Sex: [birth year] (85 y.o. M) Treating RN: Primary Care Provider: Fulton Reek Other Clinician: Referring Provider: Fulton Reek Treating Provider/Extender: Tito Dine in Treatment: 42 Constitutional Patient is hypertensive.. Pulse regular and within target range for patient.Marland Kitchen Respirations regular, non-labored and within target range.. Temperature is normal and within the target range for the patient.Marland Kitchen appears in no distress. Eyes Conjunctivae clear. No discharge. Ears, Nose, Mouth, and Throat Patient is hard of hearing.Marland Kitchen Lymphatic None palpable in the popliteal area. Psychiatric No evidence of depression, anxiety, or agitation. Calm, cooperative, and communicative. Appropriate interactions and affect.. Notes Wound exam; left lateral calf wound still remains closed. The area anteriorly is also just about closed. He has a large area which I think is all venous inflammation on the left lateral calf. There is nothing open here. The edema control in his leg is actually quite good Electronic Signature(s) Signed: 11/22/2018 4:35:36 PM By: Linton Ham MD Entered  By: Linton Ham on 11/22/2018 13:58:42 Steven Meza ([id number]) -------------------------------------------------------------------------------- Physician Orders Details Patient Name: Steven Meza Date of Service: 11/22/2018 1:00 PM Medical Record Number: [id number] Patient Account Number: [id number] Date of Birth/Sex: June 16, 1930 (85 y.o. M) Treating RN: Cornell Barman Primary Care Provider: Fulton Reek Other Clinician: Referring Provider: Fulton Reek Treating Provider/Extender: Tito Dine in Treatment: 37 Verbal / Phone Orders: No Diagnosis Coding Wound Cleansing Wound #1 Left,Lateral Lower Leg o Dial antibacterial soap, wash wounds, rinse and pat dry prior to dressing wounds Skin Barriers/Peri-Wound Care Wound #1 Left,Lateral Lower Leg o Triamcinolone Acetonide Ointment (TCA) Primary Wound Dressing Wound #1 Left,Lateral Lower Leg o Silver Alginate Secondary Dressing Wound #1 Left,Lateral Lower Leg o ABD pad Dressing Change Frequency Wound #1 Left,Lateral Lower Leg o Change dressing every week - In office Follow-up Appointments Wound #1 Left,Lateral Lower Leg o Return Appointment in 1 week. Edema Control Wound #1 Left,Lateral Lower Leg o 3 Layer Compression System - Left Lower Extremity o Patient to wear own Velcro compression garment. - Right o Compression Pump: Use compression pump on left lower extremity for 60 minutes, twice daily. o Compression Pump: Use compression pump on right lower extremity for 60 minutes, twice daily. Off-Loading Wound #1 Left,Lateral Lower Leg o Other: - Do not sleep in recliner, sleep in bed with legs elevated. Electronic Signature(s) Signed: 11/22/2018 4:35:36 PM By: Linton Ham MD Signed: 11/22/2018 4:45:53 PM By: Gretta Cool, BSN, RN, CWS, Kim RN, BSN Entered By: Gretta Cool, BSN, RN, CWS, Kim on 11/22/2018 13:31:03 Steven Meza ([id number]) Steven Meza, Steven Meza  ([id number]) -------------------------------------------------------------------------------- Problem List Details Patient Name: Steven Meza Date of Service: 11/22/2018 1:00 PM Medical Record Number: [id number] Patient Account Number: [id number] Date of Birth/Sex: 07-May-1930 (85 y.o. 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 with 05/17/2018 No Yes intermittent claudication, left leg L97.211 Non-pressure chronic ulcer of right calf limited to breakdown 08/23/2018 No Yes of skin Inactive Problems Resolved Problems Electronic Signature(s) Signed: 11/22/2018 4:35:36 PM By: Linton Ham MD Entered By: Linton Ham on 11/22/2018 13:53:18 Steven Meza ([id number]) -------------------------------------------------------------------------------- Progress Note Details Patient Name: Steven Meza Date of Service: 11/22/2018 1:00 PM Medical Record Number:  [id number] Patient Account Number: [id number] Date of Birth/Sex: 1931-03-12 (85 y.o. M) Treating RN: Primary Care Provider: Fulton Reek Other Clinician: Referring Provider: Fulton Reek Treating Provider/Extender: Tito Dine in Treatment: 27 Subjective History of Present Illness (HPI) ADMISSION 05/17/2018 Mr. Mcbryar is an 85 year old man who is been treated at the lymphedema clinic for a long period with lymphedema wraps for bilateral lower extremity lymphedema. About 6 months ago they noted small open areas on his left lateral calf just above the ankle. These were open. They  are apparently putting some form of ointment on this. They have been referred here for our review of this. The patient has a long history of lymphedema and venous stasis. It says in his records that he has recurrent cellulitis although his wife denies this but she states that the lymphedema may have started with cellulitis several years ago. Also in his record there is a history of bladder cancer which they seem to know very little about however he did not have any radiation to the pelvis or anything that could have contributed to lymphedema that they are aware of. There is no prior wound history. The patient has 2 small punched out areas on the left lateral calf that have adherent debris on the surface. Past medical history; lymphedema, hypertension, cellulitis, hearing loss, morbid obesity, osteoarthritis, venous stasis, bladder CA, diverticulosis. ABIs in our clinic were 0.36 on the right and 0.57 on the left 05/24/2018; corrected age on this patient is 10 versus what I said last week. He has been for his arterial studies which predictably are not very good. On the right his ABI is 0.65. Monophasic waveforms noted at the right ankle including the posterior tibial and dorsalis pedis. He has triphasic waveforms of the common femoral and profunda femoris triphasic proximal SFA with monophasic distal FSA and popliteal artery. Monophasic tibial waveforms. On the left his ABI is 0.58 monophasic waveforms at the left ankle. Duplex of the left lower extremity demonstrated atherosclerotic change with monophasic waveforms throughout the left lower extremity. There was some concern about left iliac disease and potentially femoral-popliteal and tibial disease. The patient does not describe claudication although according to his wife he over emphasizes his activity. Patient states he is limited by pain in both knees His wounds are on the left lateral calf. 2 small areas with necrotic debris. We have been  using silver collagen and light Ace wraps 05/31/2018; wounds on the left lateral calf in the setting of very significant lymphedema and probably PAD. We have been using silver collagen. The base of the small wound looks reasonably improved. I sent him to see Dr. Fletcher Anon however I see now he has an appointment with Dr. Alvester Chou on February 12 2/5; left lateral calf wound looks the same. His wife is doing a good job with her lymphedema wraps and maintaining that the edema. He most likely has PAD and is due to see Dr. Gwenlyn Found of infectious disease on February 12 2/19; left lateral calf wounds look about the same. This looks like wound secondary to chronic venous insufficiency with lymphedema however the patient has very poor arterial studies. We referred him to Dr. Gwenlyn Found. Dr. Gwenlyn Found did not feel he needed to do anything from an arterial point of view. He did not address the question about the aggressiveness of compression. After some thoughts about this I elected to go ahead and put him in 3 layer compression which after all would be less compression then the lymphedema clinic was  putting on this. I am hopeful that this should be enough to get some closure of the small wounds 2/26; left lateral calf wound letter this week. He tolerated the 3 layer compression well furthermore he is sleeping in a hospital bed which helps keep his legs up at night. The wound looks better measuring smaller especially in width 3/3; left lateral calf wound about the same size. The 3 layer compression has really helped get the edema down in his leg however. Using silver collagen 3/11; left lateral calf wound better looking wound surface but about the same size. We have been using 3 layer compression which is really helped get the edema down in the left leg. Even after Dr. Kennon Holter assessment I am reluctant to go to 4 layer Steven Meza, Steven A. ([id number]) compression. He is tolerating 3 layer compression well. We have been using  silver collagen 3/18-Patient returns for his left lateral calf wound which overall is looking better, slightly increased in size and dimensions and area, he is tolerating the 3 layer compression, has been dressed with silver collagen which we will continue 3/25 left lateral calf wound much better looking. Size is smaller. He has been using silver collagen 4/1; gradually getting smaller in size. Using silver collagen with 3 layer compression. I think the patient has some degree of PAD. He saw Dr. Gwenlyn Found in consultation, he did not specifically comment on whether he could tolerate 4 layer compression 4/8; the wound is slightly smaller in depth. We are using 3 layer compression with silver collagen. I had him seen for arterial insufficiency however his ABI was 0.57 in the left leg. Dr. Gwenlyn Found really did not seem to middle on the degree of compression he could tolerate 4/15; the wound is slightly smaller and slightly less deep. We have been using 3 layer compression with silver collagen. There is not an option for 4-layer compression here. 4/22; the patient is making decent progress on his wound on the left lateral calf however he arrives in with a superficial wound on the right anterior tibial area. Were not really sure how this happened. He had been using a stocking on the right leg. He has been using silver collagen on the wounds Amazing to me he is he appears to have compression pumps at home. I am not really sure I knew this before. In any case he has not been using them 4/29; left lateral calf wound continues to get gradually smaller. This looks like it is on its way to closure. The area on the right anterior tibial area is about the same in terms of size superficial without any depth. This was probably wrap injury He tells me that he has been using his compression pumps once a day for 1 hour without any pain 5/6; not as good today. Left lateral calf wound is actually larger and he does not have  quite as good edema control. The area on the right anterior tibia is about the same still superficial. He states he is using his compression pumps once a day. We are using 3 layer compression on the left and Curlex Coban on the right 5/13; the area on the right is very close to being healed. The area on the left still has some depth but has a healthy wound surface. He has excellent edema control bilaterally. Using Hydrofera Blue 5/20-Patient returns in 1 week after being in 3 layer compression on the left, the left lateral calf ulcer is stable with some slough, the right  anterior tibial ulcer is healed we have been using Kerlix Coban on that leg 5/27; Hydrofera Blue and 3 layer compression. Still some slough in the wound area and some nonviable edges around the wound. 6/3; this patient has lymphedema and chronic venous insufficiency. He had difficult to close wound on his left lateral calf. This is closed today. 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 Finally he has known PAD but he is managed to tolerate the compression we reporting on him and also using his compression pumps that he already had at home twice a day 6/10; we discharge this patient last week having finally close the small area in the left lateral calf. Apparently by Friday of last week this had opened and was draining. He is therefore back in clinic. His wife who is present also was concerned about an area on the right anterior tibia."o 6/17; the patient does not have an open area on the right leg. On the left he has a small circular area anteriorly and the same area laterally that he had last time. However both of these appear to be improved he has his Farrow wrap 30/40. It turns out that he is only using his external compression pumps once a day. I tried to get him up to twice a day today 6/24; the patient comes in with his external compression garment on the right leg/Farrow wrap. As far  as we know no open wounds here. He has 2 areas that are smaller one on the left anterior pretibial and one on the left lateral calf which is the site of his initial wounds. I have successfully caught him into his external compression pumps twice a day 7/1; still using his external compression garment on the right leg. As far as we know there are no open wounds here. The area on the left lateral calf which was the site of his initial wound has closed over. The area on the left anterior pretibial that we identified last week is still open. 7/8; as far as we know he has no open wounds on the right leg. He has his external compression garment on here. The original wound on the left lateral calf has closed over. He developed an area on the left anterior pretibial 2 weeks ago and seems to have 2 smaller areas on the proximal and distal lateral calf. We do not have his good edema control this week as I am used to seeing 7/15; the patient has still a linear area on the left anterior tibia. The area on the left lateral leg looked somewhat inflamed today but no cellulitis. He did have what looked to be a small pustule that I cultured for CandS although I did not give him empiric antibiotics. 7/22; small area on the left anterior tibia. The area on the left lateral leg still looks inflamed. The pustule that I cultured last week grew Enterobacter and Pseudomonas. I prescribed Cipro they still have not picked that up. In any case the area actually looks fairly satisfactory. He has decent edema control using his compression pumps twice a day Steven Meza, Steven Meza ([id number]) Objective Constitutional Patient is hypertensive.. Pulse regular and within target range for patient.Marland Kitchen Respirations regular, non-labored and within target range.. Temperature is normal and within the target range for the patient.Marland Kitchen appears in no distress. Vitals Time Taken: 1:06 PM, Height: 71 in, Weight: 252 lbs, BMI: 35.1, Temperature: 98.5  F, Pulse: 72 bpm, Respiratory Rate: 16 breaths/min, Blood  Pressure: 149/62 mmHg. Eyes Conjunctivae clear. No discharge. Ears, Nose, Mouth, and Throat Patient is hard of hearing.Marland Kitchen Lymphatic None palpable in the popliteal area. Psychiatric No evidence of depression, anxiety, or agitation. Calm, cooperative, and communicative. Appropriate interactions and affect.. General Notes: Wound exam; left lateral calf wound still remains closed. The area anteriorly is also just about closed. He has a large area which I think is all venous inflammation on the left lateral calf. There is nothing open here. The edema control in his leg is actually quite good Integumentary (Hair, Skin) Wound #1 status is Open. Original cause of wound was Gradually Appeared. The wound is located on the Left,Lateral Lower Leg. The wound measures 1cm length x 1cm width x 0.1cm depth; 0.785cm^2 area and 0.079cm^3 volume. There is Fat Layer (Subcutaneous Tissue) Exposed exposed. There is no tunneling or undermining noted. There is a medium amount of serous drainage noted. The wound margin is flat and intact. There is medium (34-66%) red granulation within the wound bed. There is a medium (34-66%) amount of necrotic tissue within the wound bed including Adherent Slough. Wound #3 status is Healed - Epithelialized. Original cause of wound was Blister. The wound is located on the Left,Medial Lower Leg. The wound measures 0cm length x 0cm width x 0cm depth; 0cm^2 area and 0cm^3 volume. There is Fat Layer (Subcutaneous Tissue) Exposed exposed. There is no tunneling or undermining noted. There is a medium amount of serous drainage noted. The wound margin is flat and intact. There is medium (34-66%) red granulation within the wound bed. There is a medium (34-66%) amount of necrotic tissue within the wound bed including Adherent Slough. 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 Edema Control: Wound #1 Left,Lateral Lower Leg: 3 Layer Compression System - Left Lower Extremity Patient to wear own Velcro compression garment. - Right Compression Pump: Use compression pump on left lower extremity for 60 minutes, twice daily. Compression Pump: Use compression pump on right lower extremity for 60 minutes, twice daily. Off-Loading: Wound #1 Left,Lateral Lower Leg: Other: - Do not sleep in recliner, sleep in bed with legs elevated. 1. Silver alginate 2. TCA 3. Continue 3-layer compression. This seems to be maintaining his edema along with the compression pumps. He also has known PAD 4. I am not optimistic that the juxta lite stocking that he has will maintain skin integrity on the left leg. First of all he does not put these on properly. He still leaves his legs dangling down at night apparently sleeping in a lift recliner. 5. He is using the compression pumps twice a day, may need to try to get him to increase that to 3 times a day 6. He has not picked up the ciprofloxacin I prescribed 2 days ago truthfully the area did not look actively infected today. The pustule  that drained  last week has resolved Electronic Signature(s) Signed: 11/22/2018 4:35:36 PM By: Linton Ham MD Entered By: Linton Ham on 11/22/2018 14:01:46 Steven Meza ([id number]) -------------------------------------------------------------------------------- SuperBill Details Patient Name: Steven Meza Date of Service: 11/22/2018 Medical Record Number: [id number] Patient Account Number: [id number] Date of Birth/Sex: 24-May-1930 (85 y.o. M) Treating RN: Cornell Barman Primary Care Provider: Fulton Reek Other Clinician: Referring Provider: Fulton Reek Treating Provider/Extender: Tito Dine in Treatment: 27 Diagnosis Coding ICD-10 Codes Meza Description [phone] Non-pressure chronic ulcer of left calf with other specified severity I89.0 Lymphedema, not elsewhere classified I70.212 Atherosclerosis of native arteries of extremities with intermittent claudication, left leg L97.211 Non-pressure chronic ulcer of right calf limited to breakdown of skin Facility Procedures CPT4 Meza: [id number] Description: (Facility Use Only) [facility] LWR LT LEG Modifier: Quantity: 1 Physician Procedures CPT4 Meza: [id number] Description: 39030 - WC PHYS LEVEL 3 - EST PT ICD-10 Diagnosis Description L97.228 Non-pressure chronic ulcer of left calf with other specified se L97.211 Non-pressure chronic ulcer of right calf limited to breakdown o I89.0 Lymphedema, not elsewhere  classified Modifier: verity f skin Quantity: 1 Electronic Signature(s) Signed: 11/22/2018 4:35:36 PM By: Linton Ham MD Entered By: Linton Ham on 11/22/2018 14:01:01

## 2018-11-23 NOTE — Progress Notes (Signed)
FARREN, NELLES ([id number]) Visit Report for 11/22/2018 Arrival Information Details Patient Name: ZENITH, LAMPHIER Date of Service: 11/22/2018 1:00 PM Medical Record Number: [id number] Patient Account Number: [id number] Date of Birth/Sex: 1931-03-17 (85 y.o. M) Treating RN: Primary Care Adonia Porada: Fulton Reek Other Clinician: Referring Contina Strain: Fulton Reek Treating Dartanion Teo/Extender: Tito Dine in Treatment: 68 Visit Information History Since Last Visit Added or deleted any medications: No Patient Arrived: Cane Any new allergies or adverse reactions: No Arrival Time: 13:05 Had a fall or experienced change in No Accompanied By: wife activities of daily living that may affect Transfer Assistance: None risk of falls: Patient Identification Verified: Yes Signs or symptoms of abuse/neglect since last visito No Secondary Verification Process Completed: Yes Hospitalized since last visit: No Implantable device outside of the clinic excluding No cellular tissue based products placed in the center since last visit: Has Dressing in Place as Prescribed: Yes Pain Present Now: No Electronic Signature(s) Signed: 11/22/2018 4:31:02 PM By: Lorine Bears RCP, RRT, CHT Entered By: Lorine Bears on 11/22/2018 13:06:04 [name] ([id number]) -------------------------------------------------------------------------------- Encounter Discharge Information Details Patient Name: [name] Date of Service: 11/22/2018 1:00 PM Medical Record Number: [id number] Patient Account Number: [id number] Date of Birth/Sex: Sep 11, 1930 (85 y.o. M) Treating RN: Army Melia Primary Care Homer Pfeifer: Fulton Reek Other Clinician: Referring Esker Dever: Fulton Reek Treating Arlinda Barcelona/Extender: Tito Dine in Treatment: 45 Encounter Discharge Information Items Discharge Condition: Stable Ambulatory Status: Walker Discharge Destination:  Home Transportation: Private Auto Accompanied By: wife Schedule Follow-up Appointment: Yes Clinical Summary of Care: Electronic Signature(s) Signed: 11/22/2018 3:57:58 PM By: Army Melia Entered By: Army Melia on 11/22/2018 13:46:17 [name] ([id number]) -------------------------------------------------------------------------------- Lower Extremity Assessment Details Patient Name: [name] Date of Service: 11/22/2018 1:00 PM Medical Record Number: [id number] Patient Account Number: [id number] Date of Birth/Sex: 07-Jun-1930 (85 y.o. M) Treating RN: Harold Barban Primary Care Tequita Marrs: Fulton Reek Other Clinician: Referring Britania Shreeve: Fulton Reek Treating Elverta Dimiceli/Extender: Tito Dine in Treatment: 27 Edema Assessment Assessed: [Left: No] [Right: No] [Left: Edema] [Right: :] Calf Left: Right: Point of Measurement: 31 cm From Medial Instep 44 cm cm Ankle Left: Right: Point of Measurement: 11 cm From Medial Instep 27.5 cm cm Vascular Assessment Pulses: Dorsalis Pedis Palpable: [Left:Yes] Posterior Tibial Palpable: [Left:Yes] Electronic Signature(s) Signed: 11/23/2018 3:44:22 PM By: Harold Barban Entered By: Harold Barban on 11/22/2018 13:23:19 [name] ([id number]) -------------------------------------------------------------------------------- Multi Wound Chart Details Patient Name: [name] Date of Service: 11/22/2018 1:00 PM Medical Record Number: [id number] Patient Account Number: [id number] Date of Birth/Sex: [birth year] (85 y.o. M) Treating RN: Cornell Barman Primary Care Chardai Gangemi: Fulton Reek Other Clinician: Referring Tydus Sanmiguel: Fulton Reek Treating Julie-Anne Torain/Extender: Tito Dine in Treatment: 27 Vital Signs Height(in): 71 Pulse(bpm): 72 Weight(lbs): 252 Blood Pressure(mmHg): 149/62 Body Mass Index(BMI): 35 Temperature(F): 98.5 Respiratory Rate 16 (breaths/min): Photos:  [N/A:N/A] Wound Location: Left Lower Leg - Lateral Left, Medial Lower Leg N/A Wounding Event: Gradually Appeared Blister N/A Primary Etiology: Lymphedema Venous Leg Ulcer N/A Comorbid History: Lymphedema, Coronary Artery Lymphedema, Coronary Artery N/A Disease, Hypertension Disease, Hypertension Date Acquired: 11/14/2017 10/10/2018 N/A Weeks of Treatment: 27 6 N/A Wound Status: Open Healed - Epithelialized N/A Clustered Wound: Yes No N/A Measurements L x W x D 1x1x0.1 0x0x0 N/A (cm) Area (cm) : 0.785 0 N/A Volume (cm) : 0.079 0 N/A % Reduction in Area: 75.00% 100.00% N/A % Reduction in Volume: 74.80% 100.00% N/A Classification: Partial Thickness Partial Thickness N/A Exudate Amount: Medium Medium  N/A Exudate Type: Serous Serous N/A Exudate Color: amber amber N/A Wound Margin: Flat and Intact Flat and Intact N/A Granulation Amount: Medium (34-66%) Medium (34-66%) N/A Granulation Quality: Red Red N/A Necrotic Amount: Medium (34-66%) Medium (34-66%) N/A Exposed Structures: Fat Layer (Subcutaneous Fat Layer (Subcutaneous N/A Tissue) Exposed: Yes Tissue) Exposed: Yes Fascia: No Fascia: No Tendon: No Tendon: No Muscle: No Muscle: No WILLMAN, CUNY ([id number]) Joint: No Joint: No Bone: No Bone: No Epithelialization: None Medium (34-66%) N/A Treatment Notes Electronic Signature(s) Signed: 11/22/2018 4:45:53 PM By: Gretta Cool, BSN, RN, CWS, Kim RN, BSN Entered By: Gretta Cool, BSN, RN, CWS, Kim on 11/22/2018 13:30:01 [name] ([id number]) -------------------------------------------------------------------------------- Multi-Disciplinary Care Plan Details Patient Name: [name] Date of Service: 11/22/2018 1:00 PM Medical Record Number: [id number] Patient Account Number: [id number] Date of Birth/Sex: [birth year] (85 y.o. M) Treating RN: Cornell Barman Primary Care Polette Nofsinger: Fulton Reek Other Clinician: Referring Morio Widen: Fulton Reek Treating Abbagail Scaff/Extender:  Tito Dine in Treatment: 43 Active Inactive Orientation to the Wound Care Program Nursing Diagnoses: Knowledge deficit related to the wound healing center program Goals: Patient/caregiver will verbalize understanding of the [facility] Date Initiated: 05/17/2018 Target Resolution Date: 06/17/2018 Goal Status: Active Interventions: Provide education on orientation to the wound center Notes: Venous Leg Ulcer Nursing Diagnoses: Potential for venous Insuffiency (use before diagnosis confirmed) Goals: Patient/caregiver will verbalize understanding of disease process and disease management Date Initiated: 05/17/2018 Target Resolution Date: 06/17/2018 Goal Status: Active Interventions: Assess peripheral edema status every visit. Treatment Activities: Non-invasive vascular studies : 05/17/2018 Notes: Wound/Skin Impairment Nursing Diagnoses: Impaired tissue integrity Goals: Patient/caregiver will verbalize understanding of skin care regimen Date Initiated: 05/17/2018 Target Resolution Date: 05/17/2018 Goal Status: Active SILVESTER, REIERSON ([id number]) Ulcer/skin breakdown will heal within 14 weeks Date Initiated: 05/17/2018 Target Resolution Date: 08/16/2018 Goal Status: Active Interventions: Assess ulceration(s) every visit Treatment Activities: Referred to DME Yancy Knoble for dressing supplies : 05/17/2018 Topical wound management initiated : 05/17/2018 Notes: Electronic Signature(s) Signed: 11/22/2018 4:45:53 PM By: Gretta Cool, BSN, RN, CWS, Kim RN, BSN Entered By: Gretta Cool, BSN, RN, CWS, Kim on 11/22/2018 13:29:51 [name] ([id number]) -------------------------------------------------------------------------------- Pain Assessment Details Patient Name: [name] Date of Service: 11/22/2018 1:00 PM Medical Record Number: [id number] Patient Account Number: [id number] Date of Birth/Sex: [birth year] (85 y.o. M) Treating RN: Primary Care Hannia Matchett:  Fulton Reek Other Clinician: Referring Deaven Urwin: Fulton Reek Treating Babacar Haycraft/Extender: Tito Dine in Treatment: 27 Active Problems Location of Pain Severity and Description of Pain Patient Has Paino No Site Locations Pain Management and Medication Current Pain Management: Electronic Signature(s) Signed: 11/22/2018 4:31:02 PM By: Lorine Bears RCP, RRT, CHT Entered By: Lorine Bears on 11/22/2018 13:06:22 [name] ([id number]) -------------------------------------------------------------------------------- Patient/Caregiver Education Details Patient Name: [name] Date of Service: 11/22/2018 1:00 PM Medical Record Number: [id number] Patient Account Number: [id number] Date of Birth/Gender: 1930/07/29 (85 y.o. M) Treating RN: Cornell Barman Primary Care Physician: Fulton Reek Other Clinician: Referring Physician: Fulton Reek Treating Physician/Extender: Tito Dine in Treatment: 42 Education Assessment Education Provided To: Patient Education Topics Provided Wound/Skin Impairment: Handouts: Caring for Your Ulcer Methods: Demonstration, Explain/Verbal Responses: State content correctly Electronic Signature(s) Signed: 11/22/2018 4:45:53 PM By: Gretta Cool, BSN, RN, CWS, Kim RN, BSN Entered By: Gretta Cool, BSN, RN, CWS, Kim on 11/22/2018 13:32:19 [name] ([id number]) -------------------------------------------------------------------------------- Wound Assessment Details Patient Name: [name] Date of Service: 11/22/2018 1:00 PM Medical Record Number: [id number] Patient Account Number: [id number] Date of Birth/Sex: 18-Jun-1930 (85  y.o. M) Treating RN: Harold Barban Primary Care Sutton Hirsch: Fulton Reek Other Clinician: Referring Elayah Klooster: Fulton Reek Treating Daltyn Degroat/Extender: Tito Dine in Treatment: 27 Wound Status Wound Number: 1 Primary Lymphedema Etiology: Wound  Location: Left Lower Leg - Lateral Wound Status: Open Wounding Event: Gradually Appeared Comorbid Lymphedema, Coronary Artery Disease, Date Acquired: 11/14/2017 History: Hypertension Weeks Of Treatment: 27 Clustered Wound: Yes Photos Wound Measurements Length: (cm) 1 Width: (cm) 1 Depth: (cm) 0.1 Area: (cm) 0.785 Volume: (cm) 0.079 % Reduction in Area: 75% % Reduction in Volume: 74.8% Epithelialization: None Tunneling: No Undermining: No Wound Description Classification: Partial Thickness Foul Odor Af Wound Margin: Flat and Intact Slough/Fibri Exudate Amount: Medium Exudate Type: Serous Exudate Color: amber ter Cleansing: No no Yes Wound Bed Granulation Amount: Medium (34-66%) Exposed Structure Granulation Quality: Red Fascia Exposed: No Necrotic Amount: Medium (34-66%) Fat Layer (Subcutaneous Tissue) Exposed: Yes Necrotic Quality: Adherent Slough Tendon Exposed: No Muscle Exposed: No Joint Exposed: No Bone Exposed: No Treatment Notes ZAKAI, GONYEA ([id number]) Wound #1 (Left, Lateral Lower Leg) Notes TCA periwound, silver ag, ABD, 3 layer Electronic Signature(s) Signed: 11/23/2018 3:44:22 PM By: Harold Barban Entered By: Harold Barban on 11/22/2018 13:21:20 [name] ([id number]) -------------------------------------------------------------------------------- Wound Assessment Details Patient Name: [name] Date of Service: 11/22/2018 1:00 PM Medical Record Number: [id number] Patient Account Number: [id number] Date of Birth/Sex: 1930/07/10 (85 y.o. M) Treating RN: Cornell Barman Primary Care Gabino Hagin: Fulton Reek Other Clinician: Referring Fatimah Sundquist: Fulton Reek Treating Arnez Stoneking/Extender: Tito Dine in Treatment: 27 Wound Status Wound Number: 3 Primary Venous Leg Ulcer Etiology: Wound Location: Left, Medial Lower Leg Wound Status: Healed - Epithelialized Wounding Event: Blister Comorbid Lymphedema, Coronary Artery  Disease, Date Acquired: 10/10/2018 History: Hypertension Weeks Of Treatment: 6 Clustered Wound: No Photos Wound Measurements Length: (cm) 0 % Reducti Width: (cm) 0 % Reducti Depth: (cm) 0 Epithelia Area: (cm) 0 Tunnelin Volume: (cm) 0 Undermin on in Area: 100% on in Volume: 100% lization: Medium (34-66%) g: No ing: No Wound Description Classification: Partial Thickness Foul Odor Wound Margin: Flat and Intact Slough/Fi Exudate Amount: Medium Exudate Type: Serous Exudate Color: amber After Cleansing: No brino No Wound Bed Granulation Amount: Medium (34-66%) Exposed Structure Granulation Quality: Red Fascia Exposed: No Necrotic Amount: Medium (34-66%) Fat Layer (Subcutaneous Tissue) Exposed: Yes Necrotic Quality: Adherent Slough Tendon Exposed: No Muscle Exposed: No Joint Exposed: No Bone Exposed: No Electronic Signature(s) JUNIE, ENGRAM ([id number]) Signed: 11/22/2018 4:45:53 PM By: Gretta Cool, BSN, RN, CWS, Kim RN, BSN Entered By: Gretta Cool, BSN, RN, CWS, Kim on 11/22/2018 13:29:41 [name] ([id number]) -------------------------------------------------------------------------------- Vitals Details Patient Name: [name] Date of Service: 11/22/2018 1:00 PM Medical Record Number: [id number] Patient Account Number: [id number] Date of Birth/Sex: December 03, 1930 (85 y.o. M) Treating RN: Primary Care Chanse Kagel: Fulton Reek Other Clinician: Referring Brelan Hannen: Fulton Reek Treating Merrilyn Legler/Extender: Tito Dine in Treatment: 27 Vital Signs Time Taken: 13:06 Temperature (F): 98.5 Height (in): 71 Pulse (bpm): 72 Weight (lbs): 252 Respiratory Rate (breaths/min): 16 Body Mass Index (BMI): 35.1 Blood Pressure (mmHg): 149/62 Reference Range: 80 - 120 mg / dl Electronic Signature(s) Signed: 11/22/2018 4:31:02 PM By: Lorine Bears RCP, RRT, CHT Entered By: Lorine Bears on 11/22/2018 13:09:06

## 2018-11-29 ENCOUNTER — Encounter: Payer: Medicare Other | Admitting: Internal Medicine

## 2018-11-29 ENCOUNTER — Other Ambulatory Visit: Payer: Self-pay

## 2018-11-29 DIAGNOSIS — I890 Lymphedema, not elsewhere classified: Secondary | ICD-10-CM | POA: Diagnosis not present

## 2018-11-30 NOTE — Progress Notes (Signed)
Steven Meza, Steven Meza ([id number]) Visit Report for 11/29/2018 HPI Details Patient Name: Steven Meza, Steven Meza Date of Service: 11/29/2018 2:45 PM Medical Record Number: [id number] Patient Account Number: [id number] Date of Birth/Sex: 1930-07-05 (85 y.o. M) Treating RN: Steven Meza Primary Care Provider: Fulton Reek Other Clinician: Referring Provider: Fulton Reek Treating Provider/Extender: Tito Dine in Treatment: 28 History of Present Illness HPI Description: ADMISSION 05/17/2018 Steven Meza is an 85 year old man who is been treated at the lymphedema clinic for a long period with lymphedema wraps for bilateral lower extremity lymphedema. About 6 months ago they noted small open areas on his left lateral calf just above the ankle. These were open. They are apparently putting some form of ointment on this. They have been referred here for our review of this. The patient has a long history of lymphedema and venous stasis. It says in his records that he has recurrent cellulitis although his wife denies this but she states that the lymphedema may have started with cellulitis several years ago. Also in his record there is a history of bladder cancer which they seem to know very little about however he did not have any radiation to the pelvis or anything that could have contributed to lymphedema that they are aware of. There is no prior wound history. The patient has 2 small punched out areas on the left lateral calf that have adherent debris on the surface. Past medical history; lymphedema, hypertension, cellulitis, hearing loss, morbid obesity, osteoarthritis, venous stasis, bladder CA, diverticulosis. ABIs in our clinic were 0.36 on the right and 0.57 on the left 05/24/2018; corrected age on this patient is 3 versus what I said last week. He has been for his arterial studies which predictably are not very good. On the right his ABI is 0.65. Monophasic waveforms noted at the right  ankle including the posterior tibial and dorsalis pedis. He has triphasic waveforms of the common femoral and profunda femoris triphasic proximal SFA with monophasic distal FSA and popliteal artery. Monophasic tibial waveforms. On the left his ABI is 0.58 monophasic waveforms at the left ankle. Duplex of the left lower extremity demonstrated atherosclerotic change with monophasic waveforms throughout the left lower extremity. There was some concern about left iliac disease and potentially femoral-popliteal and tibial disease. The patient does not describe claudication although according to his wife he over emphasizes his activity. Patient states he is limited by pain in both knees His wounds are on the left lateral calf. 2 small areas with necrotic debris. We have been using silver collagen and light Ace wraps 05/31/2018; wounds on the left lateral calf in the setting of very significant lymphedema and probably PAD. We have been using silver collagen. The base of the small wound looks reasonably improved. I sent him to see Dr. Fletcher Anon however I see now he has an appointment with Dr. Alvester Chou on February 12 2/5; left lateral calf wound looks the same. His wife is doing a good job with her lymphedema wraps and maintaining that the edema. He most likely has PAD and is due to see Dr. Gwenlyn Found of infectious disease on February 12 2/19; left lateral calf wounds look about the same. This looks like wound secondary to chronic venous insufficiency with lymphedema however the patient has very poor arterial studies. We referred him to Dr. Gwenlyn Found. Dr. Gwenlyn Found did not feel he needed to do anything from an arterial point of view. He did not address the question about the aggressiveness of compression. After some thoughts about  this I elected to go ahead and put him in 3 layer compression which after all would be less compression then the lymphedema clinic was putting on this. I am hopeful that this should be enough to get  some closure of the small wounds 2/26; left lateral calf wound letter this week. He tolerated the 3 layer compression well furthermore he is sleeping in a hospital bed which helps keep his legs up at night. The wound looks better measuring smaller especially in width 3/3; left lateral calf wound about the same size. The 3 layer compression has really helped get the edema down in his leg RICHERD, GRIME A. ([id number]) however. Using silver collagen 3/11; left lateral calf wound better looking wound surface but about the same size. We have been using 3 layer compression which is really helped get the edema down in the left leg. Even after Dr. Kennon Holter assessment I am reluctant to go to 4 layer compression. He is tolerating 3 layer compression well. We have been using silver collagen 3/18-Patient returns for his left lateral calf wound which overall is looking better, slightly increased in size and dimensions and area, he is tolerating the 3 layer compression, has been dressed with silver collagen which we will continue 3/25oleft lateral calf wound much better looking. Size is smaller. He has been using silver collagen 4/1; gradually getting smaller in size. Using silver collagen with 3 layer compression. I think the patient has some degree of PAD. He saw Dr. Gwenlyn Found in consultation, he did not specifically comment on whether he could tolerate 4 layer compression 4/8; the wound is slightly smaller in depth. We are using 3 layer compression with silver collagen. I had him seen for arterial insufficiency however his ABI was 0.57 in the left leg. Dr. Gwenlyn Found really did not seem to middle on the degree of compression he could tolerate 4/15; the wound is slightly smaller and slightly less deep. We have been using 3 layer compression with silver collagen. There is not an option for 4-layer compression here. 4/22; the patient is making decent progress on his wound on the left lateral calf however he arrives in  with a superficial wound on the right anterior tibial area. Were not really sure how this happened. He had been using a stocking on the right leg. He has been using silver collagen on the wounds Amazing to me he is he appears to have compression pumps at home. I am not really sure I knew this before. In any case he has not been using them 4/29; left lateral calf wound continues to get gradually smaller. This looks like it is on its way to closure. oThe area on the right anterior tibial area is about the same in terms of size superficial without any depth. This was probably wrap injury oHe tells me that he has been using his compression pumps once a day for 1 hour without any pain 5/6; not as good today. Left lateral calf wound is actually larger and he does not have quite as good edema control. The area on the right anterior tibia is about the same still superficial. He states he is using his compression pumps once a day. We are using 3 layer compression on the left and Curlex Coban on the right 5/13; the area on the right is very close to being healed. The area on the left still has some depth but has a healthy wound surface. He has excellent edema control bilaterally. Using Hydrofera Blue 5/20-Patient returns  in 1 week after being in 3 layer compression on the left, the left lateral calf ulcer is stable with some slough, the right anterior tibial ulcer is healed we have been using Kerlix Coban on that leg 5/27; Hydrofera Blue and 3 layer compression. Still some slough in the wound area and some nonviable edges around the wound. 6/3; this patient has lymphedema and chronic venous insufficiency. He had difficult to close wound on his left lateral calf. This is closed today. 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 Finally he has known PAD but he is managed to tolerate the compression we reporting on him and also using his compression pumps that he already  had at home twice a day 6/10; we discharge this patient last week having finally close the small area in the left lateral calf. Apparently by Friday of last week this had opened and was draining. He is therefore back in clinic. His wife who is present also was concerned about an area on the right anterior tibia."o 6/17; the patient does not have an open area on the right leg. On the left he has a small circular area anteriorly and the same area laterally that he had last time. However both of these appear to be improved he has his Farrow wrap 30/40. It turns out that he is only using his external compression pumps once a day. I tried to get him up to twice a day today 6/24; the patient comes in with his external compression garment on the right leg/Farrow wrap. As far as we know no open wounds here. He has 2 areas that are smaller one on the left anterior pretibial and one on the left lateral calf which is the site of his initial wounds. I have successfully caught him into his external compression pumps twice a day 7/1; still using his external compression garment on the right leg. As far as we know there are no open wounds here. The area on the left lateral calf which was the site of his initial wound has closed over. The area on the left anterior pretibial that we identified last week is still open. 7/8; as far as we know he has no open wounds on the right leg. He has his external compression garment on here. The original wound on the left lateral calf has closed over. He developed an area on the left anterior pretibial 2 weeks ago and seems to have 2 smaller areas on the proximal and distal lateral calf. We do not have his good edema control this week as I am used to seeing 7/15; the patient has still a linear area on the left anterior tibia. The area on the left lateral leg looked somewhat inflamed today but no cellulitis. He did have what looked to be a small pustule that I cultured for CandS  although I did not give him empiric antibiotics. 7/22; small area on the left anterior tibia. The area on the left lateral leg still looks inflamed. The pustule that I cultured last week grew Enterobacter and Pseudomonas. I prescribed Cipro they still have not picked that up. In any case the area actually looks fairly satisfactory. He has decent edema control using his compression pumps twice a day Steven Meza, Steven ([id number]) 7/29; the area on the left lateral leg looks a lot better than last week. Nothing is open here. He has an area just medial to the mid anterior tibia that is still open that  is only open wound today. Electronic Signature(s) Signed: 11/29/2018 5:12:30 PM By: Linton Ham MD Entered By: Linton Ham on 11/29/2018 16:21:06 Steven Meza ([id number]) -------------------------------------------------------------------------------- Physical Exam Details Patient Name: Steven Meza Date of Service: 11/29/2018 2:45 PM Medical Record Number: [id number] Patient Account Number: [id number] Date of Birth/Sex: 1930-09-05 (85 y.o. M) Treating RN: Steven Meza Primary Care Provider: Fulton Reek Other Clinician: Referring Provider: Fulton Reek Treating Provider/Extender: Tito Dine in Treatment: 28 Constitutional Sitting or standing Blood Pressure is within target range for patient.. Pulse regular and within target range for patient.Marland Kitchen Respirations regular, non-labored and within target range.. Temperature is normal and within the target range for the patient.Marland Kitchen appears in no distress. Respiratory Respiratory effort is easy and symmetric bilaterally. Rate is normal at rest and on room air.. Cardiovascular We have excellent edema control. Psychiatric No evidence of depression, anxiety, or agitation. Calm, cooperative, and communicative. Appropriate interactions and affect.. Notes Wound exam; left lateral calf wound all remains closed. The inflamed  area on the left lateral calf possibly cellulitis versus stasis dermatitis is completely resolved. He still has a single open area just medial to the tibia. This is small and appears relatively benign. No debridement is required Electronic Signature(s) Signed: 11/29/2018 5:12:30 PM By: Linton Ham MD Entered By: Linton Ham on 11/29/2018 16:23:12 Steven Meza ([id number]) -------------------------------------------------------------------------------- Physician Orders Details Patient Name: Steven Meza Date of Service: 11/29/2018 2:45 PM Medical Record Number: [id number] Patient Account Number: [id number] Date of Birth/Sex: 04-17-1931 (85 y.o. M) Treating RN: Steven Meza Primary Care Provider: Fulton Reek Other Clinician: Referring Provider: Fulton Reek Treating Provider/Extender: Tito Dine in Treatment: 14 Verbal / Phone Orders: No Diagnosis Coding Wound Cleansing Wound #3 Left,Medial Lower Leg o Dial antibacterial soap, wash wounds, rinse and pat dry prior to dressing wounds Skin Barriers/Peri-Wound Care Wound #3 Left,Medial Lower Leg o Triamcinolone Acetonide Ointment (TCA) Primary Wound Dressing Wound #3 Left,Medial Lower Leg o Silver Alginate Secondary Dressing Wound #3 Left,Medial Lower Leg o ABD pad Dressing Change Frequency Wound #3 Left,Medial Lower Leg o Change dressing every week - In office Follow-up Appointments Wound #3 Left,Medial Lower Leg o Return Appointment in 1 week. Edema Control Wound #3 Left,Medial Lower Leg o 3 Layer Compression System - Left Lower Extremity o Patient to wear own Velcro compression garment. - Right o Compression Pump: Use compression pump on left lower extremity for 60 minutes, twice daily. o Compression Pump: Use compression pump on right lower extremity for 60 minutes, twice daily. Off-Loading Wound #3 Left,Medial Lower Leg o Other: - Do not sleep in recliner, sleep in  bed with legs elevated. Electronic Signature(s) Signed: 11/29/2018 4:53:39 PM By: Steven Meza Signed: 11/29/2018 5:12:30 PM By: Linton Ham MD Entered By: Steven Meza on 11/29/2018 15:40:27 Steven Meza ([id number]) Steven Meza, Steven Meza ([id number]) -------------------------------------------------------------------------------- Problem List Details Patient Name: Steven Meza Date of Service: 11/29/2018 2:45 PM Medical Record Number: [id number] Patient Account Number: [id number] Date of Birth/Sex: Sep 03, 1930 (85 y.o. 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 with 05/17/2018 No Yes intermittent claudication, left leg L97.211 Non-pressure chronic ulcer of right calf limited to breakdown 08/23/2018 No  Yes of skin Inactive Problems Resolved Problems Electronic Signature(s) Signed: 11/29/2018 5:12:30 PM By: Linton Ham MD Entered By: Linton Ham on 11/29/2018 16:20:00 Steven Meza ([id number]) -------------------------------------------------------------------------------- Progress Note Details Patient Name: Steven Meza Date of Service: 11/29/2018 2:45 PM Medical Record Number: [id number] Patient Account Number: [id number] Date of Birth/Sex: 08-Oct-1930 (85 y.o. M) Treating RN: Steven Meza Primary Care Provider: Fulton Reek Other Clinician: Referring Provider: Fulton Reek Treating Provider/Extender: Tito Dine in Treatment: 28 Subjective History of Present Illness  (HPI) ADMISSION 05/17/2018 Mr. Murtha is an 85 year old man who is been treated at the lymphedema clinic for a long period with lymphedema wraps for bilateral lower extremity lymphedema. About 6 months ago they noted small open areas on his left lateral calf just above the ankle. These were open. They are apparently putting some form of ointment on this. They have been referred here for our review of this. The patient has a long history of lymphedema and venous stasis. It says in his records that he has recurrent cellulitis although his wife denies this but she states that the lymphedema may have started with cellulitis several years ago. Also in his record there is a history of bladder cancer which they seem to know very little about however he did not have any radiation to the pelvis or anything that could have contributed to lymphedema that they are aware of. There is no prior wound history. The patient has 2 small punched out areas on the left lateral calf that have adherent debris on the surface. Past medical history; lymphedema, hypertension, cellulitis, hearing loss, morbid obesity, osteoarthritis, venous stasis, bladder CA, diverticulosis. ABIs in our clinic were 0.36 on the right and 0.57 on the left 05/24/2018; corrected age on this patient is 39 versus what I said last week. He has been for his arterial studies which predictably are not very good. On the right his ABI is 0.65. Monophasic waveforms noted at the right ankle including the posterior tibial and dorsalis pedis. He has triphasic waveforms of the common femoral and profunda femoris triphasic proximal SFA with monophasic distal FSA and popliteal artery. Monophasic tibial waveforms. On the left his ABI is 0.58 monophasic waveforms at the left ankle. Duplex of the left lower extremity demonstrated atherosclerotic change with monophasic waveforms throughout the left lower extremity. There was some concern about left iliac disease and  potentially femoral-popliteal and tibial disease. The patient does not describe claudication although according to his wife he over emphasizes his activity. Patient states he is limited by pain in both knees His wounds are on the left lateral calf. 2 small areas with necrotic debris. We have been using silver collagen and light Ace wraps 05/31/2018; wounds on the left lateral calf in the setting of very significant lymphedema and probably PAD. We have been using silver collagen. The base of the small wound looks reasonably improved. I sent him to see Dr. Fletcher Anon however I see now he has an appointment with Dr. Alvester Chou on February 12 2/5; left lateral calf wound looks the same. His wife is doing a good job with her lymphedema wraps and maintaining that the edema. He most likely has PAD and is due to see Dr. Gwenlyn Found of infectious disease on February 12 2/19; left lateral calf wounds look about the same. This looks like wound secondary to chronic venous insufficiency with lymphedema however the patient has very poor arterial studies. We referred him to Dr. Gwenlyn Found. Dr. Gwenlyn Found did not feel he needed to  do anything from an arterial point of view. He did not address the question about the aggressiveness of compression. After some thoughts about this I elected to go ahead and put him in 3 layer compression which after all would be less compression then the lymphedema clinic was putting on this. I am hopeful that this should be enough to get some closure of the small wounds 2/26; left lateral calf wound letter this week. He tolerated the 3 layer compression well furthermore he is sleeping in a hospital bed which helps keep his legs up at night. The wound looks better measuring smaller especially in width 3/3; left lateral calf wound about the same size. The 3 layer compression has really helped get the edema down in his leg however. Using silver collagen 3/11; left lateral calf wound better looking wound surface  but about the same size. We have been using 3 layer compression which is really helped get the edema down in the left leg. Even after Dr. Kennon Holter assessment I am reluctant to go to 4 layer Steven Meza, Steven A. ([id number]) compression. He is tolerating 3 layer compression well. We have been using silver collagen 3/18-Patient returns for his left lateral calf wound which overall is looking better, slightly increased in size and dimensions and area, he is tolerating the 3 layer compression, has been dressed with silver collagen which we will continue 3/25 left lateral calf wound much better looking. Size is smaller. He has been using silver collagen 4/1; gradually getting smaller in size. Using silver collagen with 3 layer compression. I think the patient has some degree of PAD. He saw Dr. Gwenlyn Found in consultation, he did not specifically comment on whether he could tolerate 4 layer compression 4/8; the wound is slightly smaller in depth. We are using 3 layer compression with silver collagen. I had him seen for arterial insufficiency however his ABI was 0.57 in the left leg. Dr. Gwenlyn Found really did not seem to middle on the degree of compression he could tolerate 4/15; the wound is slightly smaller and slightly less deep. We have been using 3 layer compression with silver collagen. There is not an option for 4-layer compression here. 4/22; the patient is making decent progress on his wound on the left lateral calf however he arrives in with a superficial wound on the right anterior tibial area. Were not really sure how this happened. He had been using a stocking on the right leg. He has been using silver collagen on the wounds Amazing to me he is he appears to have compression pumps at home. I am not really sure I knew this before. In any case he has not been using them 4/29; left lateral calf wound continues to get gradually smaller. This looks like it is on its way to closure. The area on the right  anterior tibial area is about the same in terms of size superficial without any depth. This was probably wrap injury He tells me that he has been using his compression pumps once a day for 1 hour without any pain 5/6; not as good today. Left lateral calf wound is actually larger and he does not have quite as good edema control. The area on the right anterior tibia is about the same still superficial. He states he is using his compression pumps once a day. We are using 3 layer compression on the left and Curlex Coban on the right 5/13; the area on the right is very close to being healed. The area  on the left still has some depth but has a healthy wound surface. He has excellent edema control bilaterally. Using Hydrofera Blue 5/20-Patient returns in 1 week after being in 3 layer compression on the left, the left lateral calf ulcer is stable with some slough, the right anterior tibial ulcer is healed we have been using Kerlix Coban on that leg 5/27; Hydrofera Blue and 3 layer compression. Still some slough in the wound area and some nonviable edges around the wound. 6/3; this patient has lymphedema and chronic venous insufficiency. He had difficult to close wound on his left lateral calf. This is closed today. 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 Finally he has known PAD but he is managed to tolerate the compression we reporting on him and also using his compression pumps that he already had at home twice a day 6/10; we discharge this patient last week having finally close the small area in the left lateral calf. Apparently by Friday of last week this had opened and was draining. He is therefore back in clinic. His wife who is present also was concerned about an area on the right anterior tibia."o 6/17; the patient does not have an open area on the right leg. On the left he has a small circular area anteriorly and the same area laterally that he had last  time. However both of these appear to be improved he has his Farrow wrap 30/40. It turns out that he is only using his external compression pumps once a day. I tried to get him up to twice a day today 6/24; the patient comes in with his external compression garment on the right leg/Farrow wrap. As far as we know no open wounds here. He has 2 areas that are smaller one on the left anterior pretibial and one on the left lateral calf which is the site of his initial wounds. I have successfully caught him into his external compression pumps twice a day 7/1; still using his external compression garment on the right leg. As far as we know there are no open wounds here. The area on the left lateral calf which was the site of his initial wound has closed over. The area on the left anterior pretibial that we identified last week is still open. 7/8; as far as we know he has no open wounds on the right leg. He has his external compression garment on here. The original wound on the left lateral calf has closed over. He developed an area on the left anterior pretibial 2 weeks ago and seems to have 2 smaller areas on the proximal and distal lateral calf. We do not have his good edema control this week as I am used to seeing 7/15; the patient has still a linear area on the left anterior tibia. The area on the left lateral leg looked somewhat inflamed today but no cellulitis. He did have what looked to be a small pustule that I cultured for CandS although I did not give him empiric antibiotics. 7/22; small area on the left anterior tibia. The area on the left lateral leg still looks inflamed. The pustule that I cultured last week grew Enterobacter and Pseudomonas. I prescribed Cipro they still have not picked that up. In any case the area actually looks fairly satisfactory. He has decent edema control using his compression pumps twice a day 7/29; the area on the left lateral leg looks a lot better than last week.  Nothing is open here. He has an area just medial to the mid anterior tibia that is still open that is only open wound today. Steven Meza, Steven Meza ([id number]) Objective Constitutional Sitting or standing Blood Pressure is within target range for patient.. Pulse regular and within target range for patient.Marland Kitchen Respirations regular, non-labored and within target range.. Temperature is normal and within the target range for the patient.Marland Kitchen appears in no distress. Vitals Time Taken: 2:59 PM, Height: 71 in, Weight: 252 lbs, BMI: 35.1, Temperature: 98.7 F, Pulse: 75 bpm, Respiratory Rate: 16 breaths/min, Blood Pressure: 131/64 mmHg. Respiratory Respiratory effort is easy and symmetric bilaterally. Rate is normal at rest and on room air.. Cardiovascular We have excellent edema control. Psychiatric No evidence of depression, anxiety, or agitation. Calm, cooperative, and communicative. Appropriate interactions and affect.. General Notes: Wound exam; left lateral calf wound all remains closed. The inflamed area on the left lateral calf possibly cellulitis versus stasis dermatitis is completely resolved. He still has a single open area just medial to the tibia. This is small and appears relatively benign. No debridement is required Integumentary (Hair, Skin) Wound #1 status is Healed - Epithelialized. Original cause of wound was Gradually Appeared. The wound is located on the Left,Lateral Lower Leg. The wound measures 0cm length x 0cm width x 0cm depth; 0cm^2 area and 0cm^3 volume. There is Fat Layer (Subcutaneous Tissue) Exposed exposed. There is no tunneling or undermining noted. There is a medium amount of serous drainage noted. The wound margin is flat and intact. There is small (1-33%) red granulation within the wound bed. There is a large (67-100%) amount of necrotic tissue within the wound bed including Eschar. Wound #3 status is Open. Original cause of wound was Blister. The wound is located on the  Left,Medial Lower Leg. The wound measures 1cm length x 0.8cm width x 0.1cm depth; 0.628cm^2 area and 0.063cm^3 volume. There is Fat Layer (Subcutaneous Tissue) Exposed exposed. There is no tunneling or undermining noted. There is a medium amount of serous drainage noted. The wound margin is flat and intact. There is medium (34-66%) red granulation within the wound bed. There is a medium (34-66%) amount of necrotic tissue within the wound bed including Adherent Slough. 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 Edema Control: Wound #3 Left,Medial Lower Leg: 3 Layer Compression System - Left Lower Extremity Patient to wear own Velcro compression garment. - Right Compression Pump: Use compression pump on left lower extremity for 60 minutes, twice daily. Compression Pump: Use compression pump on right lower extremity for 60 minutes, twice daily. Off-Loading: Wound #3 Left,Medial Lower Leg: Other: - Do not sleep in recliner, sleep in bed with legs elevated. 1. Continue with silver alginate with 3 layer compression on the left lower extremity. 2. Continue  weekly follow-up. There is not an option for increasing  his compression secondary to known PAD. 3. He has external compression garment for the left leg. He uses one on the right as well along with a compression stocking Electronic Signature(s) Signed: 11/29/2018 5:12:30 PM By: Linton Ham MD Entered By: Linton Ham on 11/29/2018 16:24:33 Steven Meza ([id number]) -------------------------------------------------------------------------------- SuperBill Details Patient Name: Steven Meza Date of Service: 11/29/2018 Medical Record Number: [id number] Patient Account Number: [id number] Date of Birth/Sex: [birth year] (85 y.o. M) Treating RN: Steven Meza Primary Care Provider: Fulton Reek Other Clinician: Referring Provider: Fulton Reek Treating Provider/Extender: Tito Dine in Treatment: 28 Diagnosis Coding ICD-10 Codes Meza Description [phone] Non-pressure chronic ulcer of left calf with other specified severity I89.0 Lymphedema, not elsewhere classified I70.212 Atherosclerosis of native arteries of extremities with intermittent claudication, left leg L97.211 Non-pressure chronic ulcer of right calf limited to breakdown of skin Facility Procedures CPT4 Meza: [id number] Description: [phone] - WOUND CARE VISIT-LEV 2 EST PT Modifier: Quantity: 1 Physician Procedures CPT4 Meza: [id number] Description: 21194 - WC PHYS LEVEL 3 - EST PT ICD-10 Diagnosis Description L97.228 Non-pressure chronic ulcer of left calf with other specified se I89.0 Lymphedema, not elsewhere classified Modifier: verity Quantity: 1 Electronic Signature(s) Signed: 11/29/2018 5:12:30 PM By: Linton Ham MD Entered By: Linton Ham on 11/29/2018 16:25:06

## 2018-11-30 NOTE — Progress Notes (Signed)
Steven Meza, Steven Meza ([id number]) Visit Report for 11/29/2018 Arrival Information Details Patient Name: Steven Meza, Steven Meza Date of Service: 11/29/2018 2:45 PM Medical Record Number: [id number] Patient Account Number: [id number] Date of Birth/Sex: 10-15-1930 (85 y.o. M) Treating RN: Army Melia Primary Care Takeya Marquis: Fulton Reek Other Clinician: Referring Kasheem Toner: Fulton Reek Treating Ota Ebersole/Extender: Tito Dine in Treatment: 28 Visit Information History Since Last Visit Added or deleted any medications: No Patient Arrived: Cane Any new allergies or adverse reactions: No Arrival Time: 14:57 Had a fall or experienced change in No Accompanied By: wife activities of daily living that may affect Transfer Assistance: None risk of falls: Signs or symptoms of abuse/neglect since last visito No Hospitalized since last visit: No Has Dressing in Place as Prescribed: Yes Pain Present Now: No Electronic Signature(s) Signed: 11/29/2018 4:31:27 PM By: Army Melia Entered By: Army Melia on 11/29/2018 14:59:08 Steven Meza ([id number]) -------------------------------------------------------------------------------- Clinic Level of Care Assessment Details Patient Name: Steven Meza Date of Service: 11/29/2018 2:45 PM Medical Record Number: [id number] Patient Account Number: [id number] Date of Birth/Sex: [birth year] (85 y.o. M) Treating RN: Harold Barban Primary Care Hanin Decook: Fulton Reek Other Clinician: Referring Siddalee Vanderheiden: Fulton Reek Treating Dreshawn Hendershott/Extender: Tito Dine in Treatment: 28 Clinic Level of Care Assessment Items TOOL 4 Quantity Score []  - Use when only an EandM is performed on FOLLOW-UP visit 0 ASSESSMENTS - Nursing Assessment / Reassessment X - Reassessment of Co-morbidities (includes updates in patient status) 1 10 X- 1 5 Reassessment of Adherence to Treatment Plan ASSESSMENTS - Wound and Skin Assessment / Reassessment X -  Simple Wound Assessment / Reassessment - one wound 1 5 []  - 0 Complex Wound Assessment / Reassessment - multiple wounds []  - 0 Dermatologic / Skin Assessment (not related to wound area) ASSESSMENTS - Focused Assessment []  - Circumferential Edema Measurements - multi extremities 0 []  - 0 Nutritional Assessment / Counseling / Intervention []  - 0 Lower Extremity Assessment (monofilament, tuning fork, pulses) []  - 0 Peripheral Arterial Disease Assessment (using hand held doppler) ASSESSMENTS - Ostomy and/or Continence Assessment and Care []  - Incontinence Assessment and Management 0 []  - 0 Ostomy Care Assessment and Management (repouching, etc.) PROCESS - Coordination of Care X - Simple Patient / Family Education for ongoing care 1 15 []  - 0 Complex (extensive) Patient / Family Education for ongoing care []  - 0 Staff obtains Programmer, systems, Records, Test Results / Process Orders []  - 0 Staff telephones HHA, Nursing Homes / Clarify orders / etc []  - 0 Routine Transfer to another Facility (non-emergent condition) []  - 0 Routine Hospital Admission (non-emergent condition) []  - 0 New Admissions / Biomedical engineer / Ordering NPWT, Apligraf, etc. []  - 0 Emergency Hospital Admission (emergent condition) X- 1 10 Simple Discharge Coordination Steven Meza, Steven Meza ([id number]) []  - 0 Complex (extensive) Discharge Coordination PROCESS - Special Needs []  - Pediatric / Minor Patient Management 0 []  - 0 Isolation Patient Management []  - 0 Hearing / Language / Visual special needs []  - 0 Assessment of Community assistance (transportation, D/C planning, etc.) []  - 0 Additional assistance / Altered mentation []  - 0 Support Surface(s) Assessment (bed, cushion, seat, etc.) INTERVENTIONS - Wound Cleansing / Measurement X - Simple Wound Cleansing - one wound 1 5 []  - 0 Complex Wound Cleansing - multiple wounds X- 1 5 Wound Imaging (photographs - any number of wounds) []  - 0 Wound  Tracing (instead of photographs) X- 1 5 Simple Wound Measurement - one wound []  - 0 Complex Wound  Measurement - multiple wounds INTERVENTIONS - Wound Dressings X - Small Wound Dressing one or multiple wounds 1 10 []  - 0 Medium Wound Dressing one or multiple wounds []  - 0 Large Wound Dressing one or multiple wounds []  - 0 Application of Medications - topical []  - 0 Application of Medications - injection INTERVENTIONS - Miscellaneous []  - External ear exam 0 []  - 0 Specimen Collection (cultures, biopsies, blood, body fluids, etc.) []  - 0 Specimen(s) / Culture(s) sent or taken to Lab for analysis []  - 0 Patient Transfer (multiple staff / Civil Service fast streamer / Similar devices) []  - 0 Simple Staple / Suture removal (25 or less) []  - 0 Complex Staple / Suture removal (26 or more) []  - 0 Hypo / Hyperglycemic Management (close monitor of Blood Glucose) []  - 0 Ankle / Brachial Index (ABI) - do not check if billed separately X- 1 5 Vital Signs Steven Meza, Steven Meza ([id number]) Has the patient been seen at the hospital within the last three years: Yes Total Score: 75 Level Of Care: New/Established - Level 2 Electronic Signature(s) Signed: 11/29/2018 4:53:39 PM By: Harold Barban Entered By: Harold Barban on 11/29/2018 15:35:40 Steven Meza ([id number]) -------------------------------------------------------------------------------- Encounter Discharge Information Details Patient Name: Steven Meza Date of Service: 11/29/2018 2:45 PM Medical Record Number: [id number] Patient Account Number: [id number] Date of Birth/Sex: [birth year] (85 y.o. M) Treating RN: Army Melia Primary Care Zia Najera: Fulton Reek Other Clinician: Referring Melayna Robarts: Fulton Reek Treating Lindsee Labarre/Extender: Tito Dine in Treatment: 83 Encounter Discharge Information Items Discharge Condition: Stable Ambulatory Status: Cane Discharge Destination: Home Transportation: Private  Auto Accompanied By: wife Schedule Follow-up Appointment: Yes Clinical Summary of Care: Electronic Signature(s) Signed: 11/29/2018 4:31:27 PM By: Army Melia Entered By: Army Melia on 11/29/2018 15:53:09 Steven Meza ([id number]) -------------------------------------------------------------------------------- Lower Extremity Assessment Details Patient Name: Steven Meza Date of Service: 11/29/2018 2:45 PM Medical Record Number: [id number] Patient Account Number: [id number] Date of Birth/Sex: [birth year] (85 y.o. M) Treating RN: Army Melia Primary Care Kaneshia Cater: Fulton Reek Other Clinician: Referring Fabrice Dyal: Fulton Reek Treating Griffin Gerrard/Extender: Ricard Dillon Weeks in Treatment: 28 Edema Assessment Assessed: [Left: No] [Right: No] Edema: [Left: N] [Right: o] Vascular Assessment Pulses: Dorsalis Pedis Palpable: [Left:Yes] Electronic Signature(s) Signed: 11/29/2018 4:31:27 PM By: Army Melia Entered By: Army Melia on 11/29/2018 15:08:40 Steven Meza ([id number]) -------------------------------------------------------------------------------- Multi Wound Chart Details Patient Name: Steven Meza Date of Service: 11/29/2018 2:45 PM Medical Record Number: [id number] Patient Account Number: [id number] Date of Birth/Sex: 1931/02/21 (85 y.o. M) Treating RN: Harold Barban Primary Care Beverlyann Broxterman: Fulton Reek Other Clinician: Referring Joselito Fieldhouse: Fulton Reek Treating Glenys Snader/Extender: Tito Dine in Treatment: 28 Vital Signs Height(in): 71 Pulse(bpm): 75 Weight(lbs): 252 Blood Pressure(mmHg): 131/64 Body Mass Index(BMI): 35 Temperature(F): 98.7 Respiratory Rate 16 (breaths/min): Photos: [N/A:N/A] Wound Location: Left, Lateral Lower Leg Left Lower Leg - Medial N/A Wounding Event: Gradually Appeared Blister N/A Primary Etiology: Lymphedema Venous Leg Ulcer N/A Comorbid History: Lymphedema, Coronary Artery Lymphedema,  Coronary Artery N/A Disease, Hypertension Disease, Hypertension Date Acquired: 11/14/2017 10/10/2018 N/A Weeks of Treatment: 28 7 N/A Wound Status: Healed - Epithelialized Open N/A Clustered Wound: Yes No N/A Measurements L x W x D 0x0x0 1x0.8x0.1 N/A (cm) Area (cm) : 0 0.628 N/A Volume (cm) : 0 0.063 N/A % Reduction in Area: 100.00% 38.50% N/A % Reduction in Volume: 100.00% 38.20% N/A Classification: Partial Thickness Partial Thickness N/A Exudate Amount: Medium Medium N/A Exudate Type: Serous Serous N/A Exudate Color: amber amber N/A Wound Margin: Flat  and Intact Flat and Intact N/A Granulation Amount: Small (1-33%) Medium (34-66%) N/A Granulation Quality: Red Red N/A Necrotic Amount: Large (67-100%) Medium (34-66%) N/A Necrotic Tissue: Eschar Adherent Slough N/A Exposed Structures: Fat Layer (Subcutaneous Fat Layer (Subcutaneous N/A Tissue) Exposed: Yes Tissue) Exposed: Yes Fascia: No Fascia: No Tendon: No Tendon: No Muscle: No Muscle: No Steven Meza, Steven Meza ([id number]) Joint: No Joint: No Bone: No Bone: No Epithelialization: Medium (34-66%) None N/A Treatment Notes Wound #3 (Left, Medial Lower Leg) Notes silver ag, ABD, 3 layer Electronic Signature(s) Signed: 11/29/2018 5:12:30 PM By: Linton Ham MD Entered By: Linton Ham on 11/29/2018 16:20:10 Steven Meza ([id number]) -------------------------------------------------------------------------------- Multi-Disciplinary Care Plan Details Patient Name: Steven Meza Date of Service: 11/29/2018 2:45 PM Medical Record Number: [id number] Patient Account Number: [id number] Date of Birth/Sex: [birth year] (85 y.o. M) Treating RN: Harold Barban Primary Care Hattie Pine: Fulton Reek Other Clinician: Referring Lizmary Nader: Fulton Reek Treating Kerissa Coia/Extender: Tito Dine in Treatment: 22 Active Inactive Orientation to the Wound Care Program Nursing Diagnoses: Knowledge deficit related to  the wound healing center program Goals: Patient/caregiver will verbalize understanding of the [facility] Program Date Initiated: 05/17/2018 Target Resolution Date: 06/17/2018 Goal Status: Active Interventions: Provide education on orientation to the wound center Notes: Venous Leg Ulcer Nursing Diagnoses: Potential for venous Insuffiency (use before diagnosis confirmed) Goals: Patient/caregiver will verbalize understanding of disease process and disease management Date Initiated: 05/17/2018 Target Resolution Date: 06/17/2018 Goal Status: Active Interventions: Assess peripheral edema status every visit. Treatment Activities: Non-invasive vascular studies : 05/17/2018 Notes: Wound/Skin Impairment Nursing Diagnoses: Impaired tissue integrity Goals: Patient/caregiver will verbalize understanding of skin care regimen Date Initiated: 05/17/2018 Target Resolution Date: 05/17/2018 Goal Status: Active Steven Meza, Steven Meza ([id number]) Ulcer/skin breakdown will heal within 14 weeks Date Initiated: 05/17/2018 Target Resolution Date: 08/16/2018 Goal Status: Active Interventions: Assess ulceration(s) every visit Treatment Activities: Referred to DME Lawson Isabell for dressing supplies : 05/17/2018 Topical wound management initiated : 05/17/2018 Notes: Electronic Signature(s) Signed: 11/29/2018 4:53:39 PM By: Harold Barban Entered By: Harold Barban on 11/29/2018 15:32:11 Steven Meza ([id number]) -------------------------------------------------------------------------------- Pain Assessment Details Patient Name: Steven Meza Date of Service: 11/29/2018 2:45 PM Medical Record Number: [id number] Patient Account Number: [id number] Date of Birth/Sex: [birth year] (85 y.o. M) Treating RN: Army Melia Primary Care Jahnai Slingerland: Fulton Reek Other Clinician: Referring Emerick Weatherly: Fulton Reek Treating Melanye Hiraldo/Extender: Tito Dine in Treatment: 28 Active  Problems Location of Pain Severity and Description of Pain Patient Has Paino No Site Locations Pain Management and Medication Current Pain Management: Electronic Signature(s) Signed: 11/29/2018 4:31:27 PM By: Army Melia Entered By: Army Melia on 11/29/2018 14:59:13 Steven Meza ([id number]) -------------------------------------------------------------------------------- Patient/Caregiver Education Details Patient Name: Steven Meza Date of Service: 11/29/2018 2:45 PM Medical Record Number: [id number] Patient Account Number: [id number] Date of Birth/Gender: 1930/11/30 (85 y.o. M) Treating RN: Harold Barban Primary Care Physician: Fulton Reek Other Clinician: Referring Physician: Fulton Reek Treating Physician/Extender: Tito Dine in Treatment: 52 Education Assessment Education Provided To: Patient Education Topics Provided Wound/Skin Impairment: Handouts: Caring for Your Ulcer Methods: Demonstration, Explain/Verbal Responses: State content correctly Electronic Signature(s) Signed: 11/29/2018 4:53:39 PM By: Harold Barban Entered By: Harold Barban on 11/29/2018 15:33:50 Steven Meza ([id number]) -------------------------------------------------------------------------------- Wound Assessment Details Patient Name: Steven Meza Date of Service: 11/29/2018 2:45 PM Medical Record Number: [id number] Patient Account Number: [id number] Date of Birth/Sex: [birth year] (85 y.o. M) Treating RN: Harold Barban Primary Care Milanya Sunderland: Fulton Reek Other Clinician: Referring Giulio Bertino: Fulton Reek Treating Emrick Hensch/Extender: Ricard Dillon  Weeks in Treatment: 28 Wound Status Wound Number: 1 Primary Lymphedema Etiology: Wound Location: Left, Lateral Lower Leg Wound Status: Healed - Epithelialized Wounding Event: Gradually Appeared Comorbid Lymphedema, Coronary Artery Disease, Date Acquired: 11/14/2017 History: Hypertension Weeks Of  Treatment: 28 Clustered Wound: Yes Photos Wound Measurements Length: (cm) 0 % Reduct Width: (cm) 0 % Reduct Depth: (cm) 0 Epitheli Area: (cm) 0 Tunneli Volume: (cm) 0 Undermi ion in Area: 100% ion in Volume: 100% alization: Medium (34-66%) ng: No ning: No Wound Description Classification: Partial Thickness Wound Margin: Flat and Intact Exudate Amount: Medium Exudate Type: Serous Exudate Color: amber Foul Odor After Cleansing: No Slough/Fibrino Yes Wound Bed Granulation Amount: Small (1-33%) Exposed Structure Granulation Quality: Red Fascia Exposed: No Necrotic Amount: Large (67-100%) Fat Layer (Subcutaneous Tissue) Exposed: Yes Necrotic Quality: Eschar Tendon Exposed: No Muscle Exposed: No Joint Exposed: No Bone Exposed: No Electronic Signature(s) Steven Meza, Steven Meza ([id number]) Signed: 11/29/2018 4:53:39 PM By: Harold Barban Entered By: Harold Barban on 11/29/2018 15:34:31 Steven Meza ([id number]) -------------------------------------------------------------------------------- Wound Assessment Details Patient Name: Steven Meza Date of Service: 11/29/2018 2:45 PM Medical Record Number: [id number] Patient Account Number: [id number] Date of Birth/Sex: 06-11-1930 (85 y.o. M) Treating RN: Army Melia Primary Care Smt. Loder: Fulton Reek Other Clinician: Referring Nina Mondor: Fulton Reek Treating Muskan Bolla/Extender: Tito Dine in Treatment: 28 Wound Status Wound Number: 3 Primary Venous Leg Ulcer Etiology: Wound Location: Left Lower Leg - Medial Wound Status: Open Wounding Event: Blister Comorbid Lymphedema, Coronary Artery Disease, Date Acquired: 10/10/2018 History: Hypertension Weeks Of Treatment: 7 Clustered Wound: No Photos Wound Measurements Length: (cm) 1 Width: (cm) 0.8 Depth: (cm) 0.1 Area: (cm) 0.628 Volume: (cm) 0.063 % Reduction in Area: 38.5% % Reduction in Volume: 38.2% Epithelialization: None Tunneling:  No Undermining: No Wound Description Classification: Partial Thickness Foul Odor Aft Wound Margin: Flat and Intact Slough/Fibrin Exudate Amount: Medium Exudate Type: Serous Exudate Color: amber er Cleansing: No o No Wound Bed Granulation Amount: Medium (34-66%) Exposed Structure Granulation Quality: Red Fascia Exposed: No Necrotic Amount: Medium (34-66%) Fat Layer (Subcutaneous Tissue) Exposed: Yes Necrotic Quality: Adherent Slough Tendon Exposed: No Muscle Exposed: No Joint Exposed: No Bone Exposed: No Treatment Notes Steven Meza, Steven Meza ([id number]) Wound #3 (Left, Medial Lower Leg) Notes silver ag, ABD, 3 layer Electronic Signature(s) Signed: 11/29/2018 4:31:27 PM By: Army Melia Entered By: Army Melia on 11/29/2018 15:05:52 Steven Meza ([id number]) -------------------------------------------------------------------------------- Vitals Details Patient Name: Steven Meza Date of Service: 11/29/2018 2:45 PM Medical Record Number: [id number] Patient Account Number: [id number] Date of Birth/Sex: 29-Mar-1931 (85 y.o. M) Treating RN: Army Melia Primary Care Klover Priestly: Fulton Reek Other Clinician: Referring Desarae Placide: Fulton Reek Treating Ludivina Guymon/Extender: Tito Dine in Treatment: 28 Vital Signs Time Taken: 14:59 Temperature (F): 98.7 Height (in): 71 Pulse (bpm): 75 Weight (lbs): 252 Respiratory Rate (breaths/min): 16 Body Mass Index (BMI): 35.1 Blood Pressure (mmHg): 131/64 Reference Range: 80 - 120 mg / dl Electronic Signature(s) Signed: 11/29/2018 4:31:27 PM By: Army Melia Entered By: Army Melia on 11/29/2018 15:03:06

## 2018-12-06 ENCOUNTER — Encounter: Payer: Medicare Other | Attending: Internal Medicine | Admitting: Internal Medicine

## 2018-12-06 ENCOUNTER — Other Ambulatory Visit: Payer: Self-pay

## 2018-12-06 DIAGNOSIS — L97422 Non-pressure chronic ulcer of left heel and midfoot with fat layer exposed: Secondary | ICD-10-CM | POA: Insufficient documentation

## 2018-12-06 DIAGNOSIS — L97822 Non-pressure chronic ulcer of other part of left lower leg with fat layer exposed: Secondary | ICD-10-CM | POA: Insufficient documentation

## 2018-12-06 DIAGNOSIS — I890 Lymphedema, not elsewhere classified: Secondary | ICD-10-CM | POA: Diagnosis not present

## 2018-12-08 NOTE — Progress Notes (Signed)
Steven Meza ([id number]) Visit Report for 12/06/2018 Debridement Details Patient Name: Steven Meza, Steven Meza Date of Service: 12/06/2018 3:00 PM Medical Record Number: [id number] Patient Account Number: [id number] Date of Birth/Sex: 08/23/1930 (85 y.o. M) Treating RN: Cornell Barman Primary Care Provider: Fulton Reek Other Clinician: Referring Provider: Fulton Reek Treating Provider/Extender: Beverly Gust in Treatment: 29 Debridement Performed for Wound #3 Left,Medial Lower Leg Assessment: Performed By: Physician Tobi Bastos, MD Debridement Type: Debridement Severity of Tissue Pre Fat layer exposed Debridement: Level of Consciousness (Pre- Awake and Alert procedure): Pre-procedure Verification/Time Yes - 15:35 Out Taken: Start Time: 15:35 Pain Control: Lidocaine Total Area Debrided (L x W): 1 (cm) x 0.8 (cm) = 0.8 (cm) Tissue and other material Viable, Non-Viable, Slough, Subcutaneous, Slough debrided: Level: Skin/Subcutaneous Tissue Debridement Description: Excisional Instrument: Curette Bleeding: Minimum Hemostasis Achieved: Pressure Response to Treatment: Procedure was tolerated well Level of Consciousness Awake and Alert (Post-procedure): Post Debridement Measurements of Total Wound Length: (cm) 1 Width: (cm) 0.8 Depth: (cm) 0.2 Volume: (cm) 0.126 Character of Wound/Ulcer Post Debridement: Stable Severity of Tissue Post Debridement: Fat layer exposed Post Procedure Diagnosis Same as Pre-procedure Electronic Signature(s) Signed: 12/06/2018 4:03:26 PM By: Tobi Bastos Signed: 12/08/2018 5:41:45 PM By: Gretta Cool, BSN, RN, CWS, Kim RN, BSN Entered By: Gretta Cool, BSN, RN, CWS, Kim on 12/06/2018 15:36:35 Steven Meza ([id number]) -------------------------------------------------------------------------------- HPI Details Patient Name: Steven Meza Date of Service: 12/06/2018 3:00 PM Medical Record Number: [id number] Patient Account Number:  [id number] Date of Birth/Sex: 08-22-1930 (85 y.o. M) Treating RN: Cornell Barman Primary Care Provider: Fulton Reek Other Clinician: Referring Provider: Fulton Reek Treating Provider/Extender: Beverly Gust in Treatment: 82 History of Present Illness HPI Description: ADMISSION 05/17/2018 Steven Meza is an 85 year old man who is been treated at the lymphedema clinic for a long period with lymphedema wraps for bilateral lower extremity lymphedema. About 6 months ago they noted small open areas on his left lateral calf just above the ankle. These were open. They are apparently putting some form of ointment on this. They have been referred here for our review of this. The patient has a long history of lymphedema and venous stasis. It says in his records that he has recurrent cellulitis although his wife denies this but she states that the lymphedema may have started with cellulitis several years ago. Also in his record there is a history of bladder cancer which they seem to know very little about however he did not have any radiation to the pelvis or anything that could have contributed to lymphedema that they are aware of. There is no prior wound history. The patient has 2 small punched out areas on the left lateral calf that have adherent debris on the surface. Past medical history; lymphedema, hypertension, cellulitis, hearing loss, morbid obesity, osteoarthritis, venous stasis, bladder CA, diverticulosis. ABIs in our clinic were 0.36 on the right and 0.57 on the left 05/24/2018; corrected age on this patient is 72 versus what I said last week. He has been for his arterial studies which predictably are not very good. On the right his ABI is 0.65. Monophasic waveforms noted at the right ankle including the posterior tibial and dorsalis pedis. He has triphasic waveforms of the common femoral and profunda femoris triphasic proximal SFA with monophasic distal FSA and popliteal artery.  Monophasic tibial waveforms. On the left his ABI is 0.58 monophasic waveforms at the left ankle. Duplex of the left lower extremity demonstrated atherosclerotic change with monophasic waveforms throughout the left lower extremity. There  was some concern about left iliac disease and potentially femoral-popliteal and tibial disease. The patient does not describe claudication although according to his wife he over emphasizes his activity. Patient states he is limited by pain in both knees His wounds are on the left lateral calf. 2 small areas with necrotic debris. We have been using silver collagen and light Ace wraps 05/31/2018; wounds on the left lateral calf in the setting of very significant lymphedema and probably PAD. We have been using silver collagen. The base of the small wound looks reasonably improved. I sent him to see Dr. Fletcher Anon however I see now he has an appointment with Dr. Alvester Chou on February 12 2/5; left lateral calf wound looks the same. His wife is doing a good job with her lymphedema wraps and maintaining that the edema. He most likely has PAD and is due to see Dr. Gwenlyn Found of infectious disease on February 12 2/19; left lateral calf wounds look about the same. This looks like wound secondary to chronic venous insufficiency with lymphedema however the patient has very poor arterial studies. We referred him to Dr. Gwenlyn Found. Dr. Gwenlyn Found did not feel he needed to do anything from an arterial point of view. He did not address the question about the aggressiveness of compression. After some thoughts about this I elected to go ahead and put him in 3 layer compression which after all would be less compression then the lymphedema clinic was putting on this. I am hopeful that this should be enough to get some closure of the small wounds 2/26; left lateral calf wound letter this week. He tolerated the 3 layer compression well furthermore he is sleeping in a hospital bed which helps keep his legs up  at night. The wound looks better measuring smaller especially in width 3/3; left lateral calf wound about the same size. The 3 layer compression has really helped get the edema down in his leg however. Using silver collagen 3/11; left lateral calf wound better looking wound surface but about the same size. We have been using 3 layer compression which is really helped get the edema down in the left leg. Even after Dr. Kennon Holter assessment I am reluctant to go to 4 layer compression. He is tolerating 3 layer compression well. We have been using silver collagen Steven Meza, Steven Meza ([id number]) 3/18-Patient returns for his left lateral calf wound which overall is looking better, slightly increased in size and dimensions and area, he is tolerating the 3 layer compression, has been dressed with silver collagen which we will continue 3/25oleft lateral calf wound much better looking. Size is smaller. He has been using silver collagen 4/1; gradually getting smaller in size. Using silver collagen with 3 layer compression. I think the patient has some degree of PAD. He saw Dr. Gwenlyn Found in consultation, he did not specifically comment on whether he could tolerate 4 layer compression 4/8; the wound is slightly smaller in depth. We are using 3 layer compression with silver collagen. I had him seen for arterial insufficiency however his ABI was 0.57 in the left leg. Dr. Gwenlyn Found really did not seem to middle on the degree of compression he could tolerate 4/15; the wound is slightly smaller and slightly less deep. We have been using 3 layer compression with silver collagen. There is not an option for 4-layer compression here. 4/22; the patient is making decent progress on his wound on the left lateral calf however he arrives in with a superficial wound on the right anterior tibial  area. Were not really sure how this happened. He had been using a stocking on the right leg. He has been using silver collagen on the  wounds Amazing to me he is he appears to have compression pumps at home. I am not really sure I knew this before. In any case he has not been using them 4/29; left lateral calf wound continues to get gradually smaller. This looks like it is on its way to closure. oThe area on the right anterior tibial area is about the same in terms of size superficial without any depth. This was probably wrap injury oHe tells me that he has been using his compression pumps once a day for 1 hour without any pain 5/6; not as good today. Left lateral calf wound is actually larger and he does not have quite as good edema control. The area on the right anterior tibia is about the same still superficial. He states he is using his compression pumps once a day. We are using 3 layer compression on the left and Curlex Coban on the right 5/13; the area on the right is very close to being healed. The area on the left still has some depth but has a healthy wound surface. He has excellent edema control bilaterally. Using Hydrofera Blue 5/20-Patient returns in 1 week after being in 3 layer compression on the left, the left lateral calf ulcer is stable with some slough, the right anterior tibial ulcer is healed we have been using Kerlix Coban on that leg 5/27; Hydrofera Blue and 3 layer compression. Still some slough in the wound area and some nonviable edges around the wound. 6/3; this patient has lymphedema and chronic venous insufficiency. He had difficult to close wound on his left lateral calf. This is closed today. 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 Finally he has known PAD but he is managed to tolerate the compression we reporting on him and also using his compression pumps that he already had at home twice a day 6/10; we discharge this patient last week having finally close the small area in the left lateral calf. Apparently by Friday of last week this had opened and was  draining. He is therefore back in clinic. His wife who is present also was concerned about an area on the right anterior tibia."o 6/17; the patient does not have an open area on the right leg. On the left he has a small circular area anteriorly and the same area laterally that he had last time. However both of these appear to be improved he has his Farrow wrap 30/40. It turns out that he is only using his external compression pumps once a day. I tried to get him up to twice a day today 6/24; the patient comes in with his external compression garment on the right leg/Farrow wrap. As far as we know no open wounds here. He has 2 areas that are smaller one on the left anterior pretibial and one on the left lateral calf which is the site of his initial wounds. I have successfully caught him into his external compression pumps twice a day 7/1; still using his external compression garment on the right leg. As far as we know there are no open wounds here. The area on the left lateral calf which was the site of his initial wound has closed over. The area on the left anterior pretibial that we identified last week is still open. 7/8; as far  as we know he has no open wounds on the right leg. He has his external compression garment on here. The original wound on the left lateral calf has closed over. He developed an area on the left anterior pretibial 2 weeks ago and seems to have 2 smaller areas on the proximal and distal lateral calf. We do not have his good edema control this week as I am used to seeing 7/15; the patient has still a linear area on the left anterior tibia. The area on the left lateral leg looked somewhat inflamed today but no cellulitis. He did have what looked to be a small pustule that I cultured for CandS although I did not give him empiric antibiotics. 7/22; small area on the left anterior tibia. The area on the left lateral leg still looks inflamed. The pustule that I cultured  last week grew Enterobacter and Pseudomonas. I prescribed Cipro they still have not picked that up. In any case the area actually looks fairly satisfactory. He has decent edema control using his compression pumps twice a day 7/29; the area on the left lateral leg looks a lot better than last week. Nothing is open here. He has an area just medial to the mid anterior tibia that is still open that is only open wound today. 8/5-Patient returns at 1 week for the left leg ulcer, which is actually looking better, he is also complaining of left plantar foot pain below the great toe. He does have a hematoma here Steven Meza, Steven Meza ([id number]) Electronic Signature(s) Signed: 12/06/2018 3:39:06 PM By: Tobi Bastos Entered By: Tobi Bastos on 12/06/2018 15:39:04 Steven Meza ([id number]) -------------------------------------------------------------------------------- Physical Exam Details Patient Name: Steven Meza Date of Service: 12/06/2018 3:00 PM Medical Record Number: [id number] Patient Account Number: [id number] Date of Birth/Sex: December 03, 1930 (85 y.o. M) Treating RN: Cornell Barman Primary Care Provider: Fulton Reek Other Clinician: Referring Provider: Fulton Reek Treating Provider/Extender: Beverly Gust in Treatment: 42 Constitutional alert and oriented x 3. sitting or standing blood pressure is within target range for patient.. supine blood pressure is within target range for patient.. pulse regular and within target range for patient.Marland Kitchen respirations regular, non-labored and within target range for patient.Marland Kitchen temperature within target range for patient.. . . Well-nourished and well-hydrated in no acute distress. Notes Left leg wound has nonviable tissue at the base this wound actually looks small, I attempted to remove some of the nonviable material using a #3 curette there is some easy bleeding that also stopped. The left great toe plantar surface at the met head has a  small hematoma that is tender to touch, there is not much callus tissue here. Electronic Signature(s) Signed: 12/06/2018 3:40:13 PM By: Tobi Bastos Entered By: Tobi Bastos on 12/06/2018 15:40:12 Steven Meza ([id number]) -------------------------------------------------------------------------------- Physician Orders Details Patient Name: Steven Meza Date of Service: 12/06/2018 3:00 PM Medical Record Number: [id number] Patient Account Number: [id number] Date of Birth/Sex: 10-03-1930 (85 y.o. M) Treating RN: Cornell Barman Primary Care Provider: Fulton Reek Other Clinician: Referring Provider: Fulton Reek Treating Provider/Extender: Beverly Gust in Treatment: 70 Verbal / Phone Orders: No Diagnosis Coding Wound Cleansing Wound #3 Left,Medial Lower Leg o Dial antibacterial soap, wash wounds, rinse and pat dry prior to dressing wounds Primary Wound Dressing Wound #3 Left,Medial Lower Leg o Silver Alginate Secondary Dressing Wound #3 Left,Medial Lower Leg o ABD pad Dressing Change Frequency Wound #3 Left,Medial Lower Leg o Change dressing every week - In office Follow-up Appointments Wound #3 Left,Medial Lower  Leg o Return Appointment in 1 week. Edema Control Wound #3 Left,Medial Lower Leg o 3 Layer Compression System - Left Lower Extremity o Patient to wear own Velcro compression garment. - Right o Compression Pump: Use compression pump on left lower extremity for 60 minutes, twice daily. o Compression Pump: Use compression pump on right lower extremity for 60 minutes, twice daily. Off-Loading Wound #3 Left,Medial Lower Leg o Other: - Do not sleep in recliner, sleep in bed with legs elevated. Notes Felt for offloading on left 5th met-head. Electronic Signature(s) Signed: 12/06/2018 4:03:26 PM By: Tobi Bastos Signed: 12/08/2018 5:41:45 PM By: Gretta Cool, BSN, RN, CWS, Kim RN, BSN Entered By: Gretta Cool, BSN, RN, CWS, Kim on 12/06/2018  15:39:00 Steven Meza ([id number]) -------------------------------------------------------------------------------- Progress Note Details Patient Name: Steven Meza Date of Service: 12/06/2018 3:00 PM Medical Record Number: [id number] Patient Account Number: [id number] Date of Birth/Sex: [birth year] (85 y.o. M) Treating RN: Cornell Barman Primary Care Provider: Fulton Reek Other Clinician: Referring Provider: Fulton Reek Treating Provider/Extender: Beverly Gust in Treatment: 29 Subjective History of Present Illness (HPI) ADMISSION 05/17/2018 Mr. Cotten is an 85 year old man who is been treated at the lymphedema clinic for a long period with lymphedema wraps for bilateral lower extremity lymphedema. About 6 months ago they noted small open areas on his left lateral calf just above the ankle. These were open. They are apparently putting some form of ointment on this. They have been referred here for our review of this. The patient has a long history of lymphedema and venous stasis. It says in his records that he has recurrent cellulitis although his wife denies this but she states that the lymphedema may have started with cellulitis several years ago. Also in his record there is a history of bladder cancer which they seem to know very little about however he did not have any radiation to the pelvis or anything that could have contributed to lymphedema that they are aware of. There is no prior wound history. The patient has 2 small punched out areas on the left lateral calf that have adherent debris on the surface. Past medical history; lymphedema, hypertension, cellulitis, hearing loss, morbid obesity, osteoarthritis, venous stasis, bladder CA, diverticulosis. ABIs in our clinic were 0.36 on the right and 0.57 on the left 05/24/2018; corrected age on this patient is 45 versus what I said last week. He has been for his arterial studies which predictably are not very good. On  the right his ABI is 0.65. Monophasic waveforms noted at the right ankle including the posterior tibial and dorsalis pedis. He has triphasic waveforms of the common femoral and profunda femoris triphasic proximal SFA with monophasic distal FSA and popliteal artery. Monophasic tibial waveforms. On the left his ABI is 0.58 monophasic waveforms at the left ankle. Duplex of the left lower extremity demonstrated atherosclerotic change with monophasic waveforms throughout the left lower extremity. There was some concern about left iliac disease and potentially femoral-popliteal and tibial disease. The patient does not describe claudication although according to his wife he over emphasizes his activity. Patient states he is limited by pain in both knees His wounds are on the left lateral calf. 2 small areas with necrotic debris. We have been using silver collagen and light Ace wraps 05/31/2018; wounds on the left lateral calf in the setting of very significant lymphedema and probably PAD. We have been using silver collagen. The base of the small wound looks reasonably improved. I sent him to see Dr. Fletcher Anon however  I see now he has an appointment with Dr. Alvester Chou on February 12 2/5; left lateral calf wound looks the same. His wife is doing a good job with her lymphedema wraps and maintaining that the edema. He most likely has PAD and is due to see Dr. Gwenlyn Found of infectious disease on February 12 2/19; left lateral calf wounds look about the same. This looks like wound secondary to chronic venous insufficiency with lymphedema however the patient has very poor arterial studies. We referred him to Dr. Gwenlyn Found. Dr. Gwenlyn Found did not feel he needed to do anything from an arterial point of view. He did not address the question about the aggressiveness of compression. After some thoughts about this I elected to go ahead and put him in 3 layer compression which after all would be less compression then the lymphedema clinic  was putting on this. I am hopeful that this should be enough to get some closure of the small wounds 2/26; left lateral calf wound letter this week. He tolerated the 3 layer compression well furthermore he is sleeping in a hospital bed which helps keep his legs up at night. The wound looks better measuring smaller especially in width 3/3; left lateral calf wound about the same size. The 3 layer compression has really helped get the edema down in his leg however. Using silver collagen 3/11; left lateral calf wound better looking wound surface but about the same size. We have been using 3 layer compression which is really helped get the edema down in the left leg. Even after Dr. Kennon Holter assessment I am reluctant to go to 4 layer Steven Meza, Steven A. ([id number]) compression. He is tolerating 3 layer compression well. We have been using silver collagen 3/18-Patient returns for his left lateral calf wound which overall is looking better, slightly increased in size and dimensions and area, he is tolerating the 3 layer compression, has been dressed with silver collagen which we will continue 3/25 left lateral calf wound much better looking. Size is smaller. He has been using silver collagen 4/1; gradually getting smaller in size. Using silver collagen with 3 layer compression. I think the patient has some degree of PAD. He saw Dr. Gwenlyn Found in consultation, he did not specifically comment on whether he could tolerate 4 layer compression 4/8; the wound is slightly smaller in depth. We are using 3 layer compression with silver collagen. I had him seen for arterial insufficiency however his ABI was 0.57 in the left leg. Dr. Gwenlyn Found really did not seem to middle on the degree of compression he could tolerate 4/15; the wound is slightly smaller and slightly less deep. We have been using 3 layer compression with silver collagen. There is not an option for 4-layer compression here. 4/22; the patient is making decent  progress on his wound on the left lateral calf however he arrives in with a superficial wound on the right anterior tibial area. Were not really sure how this happened. He had been using a stocking on the right leg. He has been using silver collagen on the wounds Amazing to me he is he appears to have compression pumps at home. I am not really sure I knew this before. In any case he has not been using them 4/29; left lateral calf wound continues to get gradually smaller. This looks like it is on its way to closure. The area on the right anterior tibial area is about the same in terms of size superficial without any depth. This was probably  wrap injury He tells me that he has been using his compression pumps once a day for 1 hour without any pain 5/6; not as good today. Left lateral calf wound is actually larger and he does not have quite as good edema control. The area on the right anterior tibia is about the same still superficial. He states he is using his compression pumps once a day. We are using 3 layer compression on the left and Curlex Coban on the right 5/13; the area on the right is very close to being healed. The area on the left still has some depth but has a healthy wound surface. He has excellent edema control bilaterally. Using Hydrofera Blue 5/20-Patient returns in 1 week after being in 3 layer compression on the left, the left lateral calf ulcer is stable with some slough, the right anterior tibial ulcer is healed we have been using Kerlix Coban on that leg 5/27; Hydrofera Blue and 3 layer compression. Still some slough in the wound area and some nonviable edges around the wound. 6/3; this patient has lymphedema and chronic venous insufficiency. He had difficult to close wound on his left lateral calf. This is closed today. 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 Finally he has known PAD but he is managed to tolerate the compression we  reporting on him and also using his compression pumps that he already had at home twice a day 6/10; we discharge this patient last week having finally close the small area in the left lateral calf. Apparently by Friday of last week this had opened and was draining. He is therefore back in clinic. His wife who is present also was concerned about an area on the right anterior tibia."o 6/17; the patient does not have an open area on the right leg. On the left he has a small circular area anteriorly and the same area laterally that he had last time. However both of these appear to be improved he has his Farrow wrap 30/40. It turns out that he is only using his external compression pumps once a day. I tried to get him up to twice a day today 6/24; the patient comes in with his external compression garment on the right leg/Farrow wrap. As far as we know no open wounds here. He has 2 areas that are smaller one on the left anterior pretibial and one on the left lateral calf which is the site of his initial wounds. I have successfully caught him into his external compression pumps twice a day 7/1; still using his external compression garment on the right leg. As far as we know there are no open wounds here. The area on the left lateral calf which was the site of his initial wound has closed over. The area on the left anterior pretibial that we identified last week is still open. 7/8; as far as we know he has no open wounds on the right leg. He has his external compression garment on here. The original wound on the left lateral calf has closed over. He developed an area on the left anterior pretibial 2 weeks ago and seems to have 2 smaller areas on the proximal and distal lateral calf. We do not have his good edema control this week as I am used to seeing 7/15; the patient has still a linear area on the left anterior tibia. The area on the left lateral leg looked somewhat inflamed today but no cellulitis.  He  did have what looked to be a small pustule that I cultured for CandS although I did not give him empiric antibiotics. 7/22; small area on the left anterior tibia. The area on the left lateral leg still looks inflamed. The pustule that I cultured last week grew Enterobacter and Pseudomonas. I prescribed Cipro they still have not picked that up. In any case the area actually looks fairly satisfactory. He has decent edema control using his compression pumps twice a day 7/29; the area on the left lateral leg looks a lot better than last week. Nothing is open here. He has an area just medial to the mid anterior tibia that is still open that is only open wound today. 8/5-Patient returns at 1 week for the left leg ulcer, which is actually looking better, he is also complaining of left plantar foot Steven Meza, Steven A. ([id number]) pain below the great toe. He does have a hematoma here Objective Constitutional alert and oriented x 3. sitting or standing blood pressure is within target range for patient.. supine blood pressure is within target range for patient.. pulse regular and within target range for patient.Marland Kitchen respirations regular, non-labored and within target range for patient.Marland Kitchen temperature within target range for patient.. Well-nourished and well-hydrated in no acute distress. Vitals Time Taken: 2:58 PM, Height: 71 in, Weight: 252 lbs, BMI: 35.1, Temperature: 98.5 F, Pulse: 88 bpm, Respiratory Rate: 16 breaths/min, Blood Pressure: 161/71 mmHg. General Notes: Left leg wound has nonviable tissue at the base this wound actually looks small, I attempted to remove some of the nonviable material using a #3 curette there is some easy bleeding that also stopped. The left great toe plantar surface at the met head has a small hematoma that is tender to touch, there is not much callus tissue here. Integumentary (Hair, Skin) Wound #3 status is Open. Original cause of wound was Blister. The wound is located  on the Left,Medial Lower Leg. The wound measures 1cm length x 0.8cm width x 0.2cm depth; 0.628cm^2 area and 0.126cm^3 volume. There is Fat Layer (Subcutaneous Tissue) Exposed exposed. There is no tunneling or undermining noted. There is a medium amount of serous drainage noted. The wound margin is flat and intact. There is medium (34-66%) red granulation within the wound bed. There is a medium (34-66%) amount of necrotic tissue within the wound bed including Adherent Slough. Other Condition(s) Patient presents with Suspected Deep Tissue Injury located on the Left Foot. General Notes: patient has small bruise on plantar 5th met head Procedures Wound #3 Pre-procedure diagnosis of Wound #3 is a Venous Leg Ulcer located on the Left,Medial Lower Leg .Severity of Tissue Pre Debridement is: Fat layer exposed. There was a Excisional Skin/Subcutaneous Tissue Debridement with a total area of 0.8 sq cm performed by Tobi Bastos, MD. With the following instrument(s): Curette to remove Viable and Non-Viable tissue/material. Material removed includes Subcutaneous Tissue and Slough and after achieving pain control using Lidocaine. No specimens were taken. A time out was conducted at 15:35, prior to the start of the procedure. A Minimum amount of bleeding was controlled with Pressure. The procedure was tolerated well. Post Debridement Measurements: 1cm length x 0.8cm width x 0.2cm depth; 0.126cm^3 volume. Character of Wound/Ulcer Post Debridement is stable. Severity of Tissue Post Debridement is: Fat layer exposed. Post procedure Diagnosis Wound #3: Same as Pre-Procedure Steven Meza, Steven Meza ([id number]) Plan Wound Cleansing: Wound #3 Left,Medial Lower Leg: Dial antibacterial soap, wash wounds, rinse and pat dry prior to dressing wounds Primary Wound Dressing: Wound #  3 Left,Medial Lower Leg: Silver Alginate Secondary Dressing: Wound #3 Left,Medial Lower Leg: ABD pad Dressing Change Frequency: Wound #3  Left,Medial Lower Leg: Change dressing every week - In office Follow-up Appointments: Wound #3 Left,Medial Lower Leg: Return Appointment in 1 week. Edema Control: Wound #3 Left,Medial Lower Leg: 3 Layer Compression System - Left Lower Extremity Patient to wear own Velcro compression garment. - Right Compression Pump: Use compression pump on left lower extremity for 60 minutes, twice daily. Compression Pump: Use compression pump on right lower extremity for 60 minutes, twice daily. Off-Loading: Wound #3 Left,Medial Lower Leg: Other: - Do not sleep in recliner, sleep in bed with legs elevated. General Notes: Felt for offloading on left 5th met-head. 1. Continue silver alginate to the wound 2. Off load the left foot from hematoma standpoint 3. Return to clinic next week Electronic Signature(s) Signed: 12/06/2018 3:41:45 PM By: Tobi Bastos Entered By: Tobi Bastos on 12/06/2018 15:41:45 Steven Meza ([id number]) -------------------------------------------------------------------------------- SuperBill Details Patient Name: Steven Meza Date of Service: 12/06/2018 Medical Record Number: [id number] Patient Account Number: [id number] Date of Birth/Sex: 1931/02/06 (85 y.o. 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

## 2018-12-08 NOTE — Progress Notes (Signed)
PERLIE, SCHEURING ([id number]) Visit Report for 12/06/2018 Arrival Information Details Patient Name: Steven Meza, Steven Meza Date of Service: 12/06/2018 3:00 PM Medical Record Number: [id number] Patient Account Number: [id number] Date of Birth/Sex: [birth year] (85 y.o. M) Treating RN: Army Melia Primary Care Elaynah Virginia: Fulton Reek Other Clinician: Referring Dalilah Curlin: Fulton Reek Treating Oree Mirelez/Extender: Beverly Gust in Treatment: 29 Visit Information History Since Last Visit Added or deleted any medications: No Patient Arrived: Cane Any new allergies or adverse reactions: No Arrival Time: 14:57 Had a fall or experienced change in No Accompanied By: wife activities of daily living that may affect Transfer Assistance: None risk of falls: Signs or symptoms of abuse/neglect since last visito No Hospitalized since last visit: No Has Dressing in Place as Prescribed: Yes Pain Present Now: No Electronic Signature(s) Signed: 12/06/2018 4:04:06 PM By: Army Melia Entered By: Army Melia on 12/06/2018 14:57:57 Steven Meza ([id number]) -------------------------------------------------------------------------------- Encounter Discharge Information Details Patient Name: Steven Meza Date of Service: 12/06/2018 3:00 PM Medical Record Number: [id number] Patient Account Number: [id number] Date of Birth/Sex: 01/09/1931 (85 y.o. M) Treating RN: Army Melia Primary Care Keiston Manley: Fulton Reek Other Clinician: Referring Rylei Masella: Fulton Reek Treating Isiah Scheel/Extender: Beverly Gust in Treatment: 70 Encounter Discharge Information Items Post Procedure Vitals Discharge Condition: Stable Temperature (F): 98.5 Ambulatory Status: Cane Pulse (bpm): 88 Discharge Destination: Home Respiratory Rate (breaths/min): 16 Transportation: Private Auto Blood Pressure (mmHg): 161/71 Accompanied By: spouse Schedule Follow-up Appointment: Yes Clinical Summary of  Care: Electronic Signature(s) Signed: 12/06/2018 4:04:06 PM By: Army Melia Entered By: Army Melia on 12/06/2018 15:48:43 Steven Meza ([id number]) -------------------------------------------------------------------------------- Lower Extremity Assessment Details Patient Name: Steven Meza Date of Service: 12/06/2018 3:00 PM Medical Record Number: [id number] Patient Account Number: [id number] Date of Birth/Sex: 1931/01/25 (85 y.o. M) Treating RN: Army Melia Primary Care Gretchen Weinfeld: Fulton Reek Other Clinician: Referring Shannia Jacuinde: Fulton Reek Treating Kendrew Paci/Extender: Beverly Gust in Treatment: 29 Edema Assessment Assessed: [Left: No] Patrice Paradise: No] Edema: [Left: N] [Right: o] Vascular Assessment Pulses: Dorsalis Pedis Palpable: [Left:Yes] Electronic Signature(s) Signed: 12/06/2018 4:04:06 PM By: Army Melia Entered By: Army Melia on 12/06/2018 15:07:16 Steven Meza ([id number]) -------------------------------------------------------------------------------- Multi Wound Chart Details Patient Name: Steven Meza Date of Service: 12/06/2018 3:00 PM Medical Record Number: [id number] Patient Account Number: [id number] Date of Birth/Sex: 1930-07-20 (85 y.o. M) Treating RN: Cornell Barman Primary Care Kynedi Profitt: Fulton Reek Other Clinician: Referring Gaspar Fowle: Fulton Reek Treating Lexie Koehl/Extender: Beverly Gust in Treatment: 29 Vital Signs Height(in): 71 Pulse(bpm): 88 Weight(lbs): 252 Blood Pressure(mmHg): 161/71 Body Mass Index(BMI): 35 Temperature(F): 98.5 Respiratory Rate 16 (breaths/min): Photos: [N/A:N/A] Wound Location: Left Lower Leg - Medial N/A N/A Wounding Event: Blister N/A N/A Primary Etiology: Venous Leg Ulcer N/A N/A Comorbid History: Lymphedema, Coronary Artery N/A N/A Disease, Hypertension Date Acquired: 10/10/2018 N/A N/A Weeks of Treatment: 8 N/A N/A Wound Status: Open N/A N/A Measurements L x W x D 1x0.8x0.2  N/A N/A (cm) Area (cm) : 0.628 N/A N/A Volume (cm) : 0.126 N/A N/A % Reduction in Area: 38.50% N/A N/A % Reduction in Volume: -23.50% N/A N/A Classification: Partial Thickness N/A N/A Exudate Amount: Medium N/A N/A Exudate Type: Serous N/A N/A Exudate Color: amber N/A N/A Wound Margin: Flat and Intact N/A N/A Granulation Amount: Medium (34-66%) N/A N/A Granulation Quality: Red N/A N/A Necrotic Amount: Medium (34-66%) N/A N/A Exposed Structures: Fat Layer (Subcutaneous N/A N/A Tissue) Exposed: Yes Fascia: No Tendon: No Muscle: No Joint: No Bone: No Steven Meza, Steven Meza ([id number]) Epithelialization: None N/A N/A Treatment Notes  Electronic Signature(s) Signed: 12/08/2018 5:41:45 PM By: Gretta Cool, BSN, RN, CWS, Kim RN, BSN Entered By: Gretta Cool, BSN, RN, CWS, Kim on 12/06/2018 15:35:27 Steven Meza ([id number]) -------------------------------------------------------------------------------- Multi-Disciplinary Care Plan Details Patient Name: Steven Meza Date of Service: 12/06/2018 3:00 PM Medical Record Number: [id number] Patient Account Number: [id number] Date of Birth/Sex: 1930-05-09 (85 y.o. M) Treating RN: Cornell Barman Primary Care Nazia Rhines: Fulton Reek Other Clinician: Referring Lada Fulbright: Fulton Reek Treating Arisha Gervais/Extender: Beverly Gust in Treatment: 53 Active Inactive Orientation to the Wound Care Program Nursing Diagnoses: Knowledge deficit related to the wound healing center program Goals: Patient/caregiver will verbalize understanding of the [facility] Program Date Initiated: 05/17/2018 Target Resolution Date: 06/17/2018 Goal Status: Active Interventions: Provide education on orientation to the wound center Notes: Venous Leg Ulcer Nursing Diagnoses: Potential for venous Insuffiency (use before diagnosis confirmed) Goals: Patient/caregiver will verbalize understanding of disease process and disease management Date Initiated:  05/17/2018 Target Resolution Date: 06/17/2018 Goal Status: Active Interventions: Assess peripheral edema status every visit. Treatment Activities: Non-invasive vascular studies : 05/17/2018 Notes: Wound/Skin Impairment Nursing Diagnoses: Impaired tissue integrity Goals: Patient/caregiver will verbalize understanding of skin care regimen Date Initiated: 05/17/2018 Target Resolution Date: 05/17/2018 Goal Status: Active Steven Meza, Steven Meza ([id number]) Ulcer/skin breakdown will heal within 14 weeks Date Initiated: 05/17/2018 Target Resolution Date: 08/16/2018 Goal Status: Active Interventions: Assess ulceration(s) every visit Treatment Activities: Referred to DME Danika Kluender for dressing supplies : 05/17/2018 Topical wound management initiated : 05/17/2018 Notes: Electronic Signature(s) Signed: 12/08/2018 5:41:45 PM By: Gretta Cool, BSN, RN, CWS, Kim RN, BSN Entered By: Gretta Cool, BSN, RN, CWS, Kim on 12/06/2018 15:29:30 Steven Meza ([id number]) -------------------------------------------------------------------------------- Non-Wound Condition Assessment Details Patient Name: Steven Meza Date of Service: 12/06/2018 3:00 PM Medical Record Number: [id number] Patient Account Number: [id number] Date of Birth/Sex: 1930/11/15 (85 y.o. M) Treating RN: Cornell Barman Primary Care Sianne Tejada: Fulton Reek Other Clinician: Referring Ashraf Mesta: Fulton Reek Treating Sheala Dosh/Extender: Beverly Gust in Treatment: 29 Non-Wound Condition: Condition: Suspected Deep Tissue Injury Location: Foot Side: Left Notes patient has small bruise on plantar 5th met head Electronic Signature(s) Signed: 12/08/2018 5:41:45 PM By: Gretta Cool, BSN, RN, CWS, Kim RN, BSN Entered By: Gretta Cool, BSN, RN, CWS, Kim on 12/06/2018 15:33:42 Steven Meza ([id number]) -------------------------------------------------------------------------------- Pain Assessment Details Patient Name: Steven Meza Date of Service:  12/06/2018 3:00 PM Medical Record Number: [id number] Patient Account Number: [id number] Date of Birth/Sex: 1930/07/20 (85 y.o. M) Treating RN: Army Melia Primary Care Horace Lukas: Fulton Reek Other Clinician: Referring Dhanvin Szeto: Fulton Reek Treating Tyshawn Keel/Extender: Beverly Gust in Treatment: 29 Active Problems Location of Pain Severity and Description of Pain Patient Has Paino No Site Locations Pain Management and Medication Current Pain Management: Electronic Signature(s) Signed: 12/06/2018 4:04:06 PM By: Army Melia Entered By: Army Melia on 12/06/2018 14:58:34 Steven Meza ([id number]) -------------------------------------------------------------------------------- Patient/Caregiver Education Details Patient Name: Steven Meza Date of Service: 12/06/2018 3:00 PM Medical Record Number: [id number] Patient Account Number: [id number] Date of Birth/Gender: 09-28-1930 (85 y.o. M) Treating RN: Cornell Barman Primary Care Physician: Fulton Reek Other Clinician: Referring Physician: Fulton Reek Treating Physician/Extender: Beverly Gust in Treatment: 33 Education Assessment Education Provided To: Patient Education Topics Provided Wound/Skin Impairment: Handouts: Caring for Your Ulcer Methods: Demonstration, Explain/Verbal Responses: State content correctly Electronic Signature(s) Signed: 12/08/2018 5:41:45 PM By: Gretta Cool, BSN, RN, CWS, Kim RN, BSN Entered By: Gretta Cool, BSN, RN, CWS, Kim on 12/06/2018 15:39:31 Steven Meza ([id number]) -------------------------------------------------------------------------------- Wound Assessment Details Patient Name: Steven Meza Date of Service: 12/06/2018 3:00 PM Medical  Record Number: [id number] Patient Account Number: [id number] Date of Birth/Sex: Dec 12, 1930 (85 y.o. M) Treating RN: Army Melia Primary Care Odessia Asleson: Fulton Reek Other Clinician: Referring Kooper Godshall: Fulton Reek Treating  Rose Hegner/Extender: Beverly Gust in Treatment: 29 Wound Status Wound Number: 3 Primary Venous Leg Ulcer Etiology: Wound Location: Left Lower Leg - Medial Wound Status: Open Wounding Event: Blister Comorbid Lymphedema, Coronary Artery Disease, Date Acquired: 10/10/2018 History: Hypertension Weeks Of Treatment: 8 Clustered Wound: No Photos Wound Measurements Length: (cm) 1 % Reduction in Width: (cm) 0.8 % Reduction in Depth: (cm) 0.2 Epithelializati Area: (cm) 0.628 Tunneling: Volume: (cm) 0.126 Undermining: Area: 38.5% Volume: -23.5% on: None No No Wound Description Classification: Partial Thickness Foul Odor After Wound Margin: Flat and Intact Slough/Fibrino Exudate Amount: Medium Exudate Type: Serous Exudate Color: amber Cleansing: No No Wound Bed Granulation Amount: Medium (34-66%) Exposed Structure Granulation Quality: Red Fascia Exposed: No Necrotic Amount: Medium (34-66%) Fat Layer (Subcutaneous Tissue) Exposed: Yes Necrotic Quality: Adherent Slough Tendon Exposed: No Muscle Exposed: No Joint Exposed: No Bone Exposed: No Treatment Notes Steven Meza, Steven Meza ([id number]) Wound #3 (Left, Medial Lower Leg) Notes silver ag, ABD, 3 layer Electronic Signature(s) Signed: 12/06/2018 4:04:06 PM By: Army Melia Entered By: Army Melia on 12/06/2018 15:06:55 Steven Meza ([id number]) -------------------------------------------------------------------------------- Vitals Details Patient Name: Steven Meza Date of Service: 12/06/2018 3:00 PM Medical Record Number: [id number] Patient Account Number: [id number] Date of Birth/Sex: March 09, 1931 (85 y.o. M) Treating RN: Army Melia Primary Care Auther Lyerly: Fulton Reek Other Clinician: Referring Jeanet Lupe: Fulton Reek Treating Tashay Bozich/Extender: Beverly Gust in Treatment: 29 Vital Signs Time Taken: 14:58 Temperature (F): 98.5 Height (in): 71 Pulse (bpm): 88 Weight (lbs):  252 Respiratory Rate (breaths/min): 16 Body Mass Index (BMI): 35.1 Blood Pressure (mmHg): 161/71 Reference Range: 80 - 120 mg / dl Electronic Signature(s) Signed: 12/06/2018 4:04:06 PM By: Army Melia Entered By: Army Melia on 12/06/2018 14:59:54

## 2018-12-13 ENCOUNTER — Other Ambulatory Visit: Payer: Self-pay

## 2018-12-13 ENCOUNTER — Encounter: Payer: Medicare Other | Admitting: Internal Medicine

## 2018-12-13 DIAGNOSIS — L97822 Non-pressure chronic ulcer of other part of left lower leg with fat layer exposed: Secondary | ICD-10-CM | POA: Diagnosis not present

## 2018-12-14 NOTE — Progress Notes (Signed)
LENON, KUENNEN ([id number]) Visit Report for 12/13/2018 Arrival Information Details Patient Name: Steven Meza, Steven Meza Date of Service: 12/13/2018 3:30 PM Medical Record Number: [id number] Patient Account Number: [id number] Date of Birth/Sex: [birth year] (85 y.o. M) Treating RN: Harold Barban Primary Care Dayra Rapley: Fulton Reek Other Clinician: Referring Trevia Nop: Fulton Reek Treating Emmamarie Kluender/Extender: Beverly Gust in Treatment: 33 Visit Information History Since Last Visit Added or deleted any medications: No Patient Arrived: Steven Meza Any new allergies or adverse reactions: No Arrival Time: 15:34 Had a fall or experienced change in No Accompanied By: wife activities of daily living that may affect Transfer Assistance: None risk of falls: Patient Identification Verified: Yes Signs or symptoms of abuse/neglect since last visito No Secondary Verification Process Completed: Yes Hospitalized since last visit: No Has Dressing in Place as Prescribed: Yes Has Compression in Place as Prescribed: Yes Pain Present Now: No Electronic Signature(s) Signed: 12/13/2018 4:01:33 PM By: Harold Barban Entered By: Harold Barban on 12/13/2018 15:37:20 Steven Meza ([id number]) -------------------------------------------------------------------------------- Compression Therapy Details Patient Name: Steven Meza Date of Service: 12/13/2018 3:30 PM Medical Record Number: [id number] Patient Account Number: [id number] Date of Birth/Sex: 1930/09/07 (85 y.o. M) Treating RN: Cornell Barman Primary Care Solomia Harrell: Fulton Reek Other Clinician: Referring Karina Lenderman: Fulton Reek Treating Janaysha Depaulo/Extender: Beverly Gust in Treatment: 30 Compression Therapy Performed for Wound Assessment: Wound #3 Left,Medial Lower Leg Performed By: Clinician Cornell Barman, RN Compression Type: Three Layer Pre Treatment ABI: 0.6 Post Procedure Diagnosis Same as Pre-procedure Electronic  Signature(s) Signed: 12/13/2018 5:00:13 PM By: Gretta Cool, BSN, RN, CWS, Kim RN, BSN Entered By: Gretta Cool, BSN, RN, CWS, Kim on 12/13/2018 16:05:35 Steven Meza ([id number]) -------------------------------------------------------------------------------- Encounter Discharge Information Details Patient Name: Steven Meza Date of Service: 12/13/2018 3:30 PM Medical Record Number: [id number] Patient Account Number: [id number] Date of Birth/Sex: Dec 08, 1930 (85 y.o. M) Treating RN: Army Melia Primary Care Zacari Radick: Fulton Reek Other Clinician: Referring Paisli Silfies: Fulton Reek Treating Tunya Held/Extender: Beverly Gust in Treatment: 30 Encounter Discharge Information Items Discharge Condition: Stable Ambulatory Status: Cane Discharge Destination: Home Transportation: Private Auto Accompanied By: wife Schedule Follow-up Appointment: Yes Clinical Summary of Care: Electronic Signature(s) Signed: 12/13/2018 4:29:12 PM By: Army Melia Entered By: Army Melia on 12/13/2018 16:14:21 Steven Meza ([id number]) -------------------------------------------------------------------------------- Lower Extremity Assessment Details Patient Name: Steven Meza Date of Service: 12/13/2018 3:30 PM Medical Record Number: [id number] Patient Account Number: [id number] Date of Birth/Sex: [birth year] (85 y.o. M) Treating RN: Harold Barban Primary Care Durward Matranga: Fulton Reek Other Clinician: Referring Odis Turck: Fulton Reek Treating Lataunya Ruud/Extender: Beverly Gust in Treatment: 30 Edema Assessment Assessed: [Left: No] Patrice Paradise: No] [Left: Edema] [Right: :] Calf Left: Right: Point of Measurement: 32 cm From Medial Instep 43 cm cm Ankle Left: Right: Point of Measurement: 11 cm From Medial Instep 28 cm cm Vascular Assessment Pulses: Dorsalis Pedis Palpable: [Left:Yes] Posterior Tibial Palpable: [Left:Yes] Electronic Signature(s) Signed: 12/13/2018 4:01:33 PM By:  Harold Barban Entered By: Harold Barban on 12/13/2018 15:51:14 Steven Meza ([id number]) -------------------------------------------------------------------------------- Multi Wound Chart Details Patient Name: Steven Meza Date of Service: 12/13/2018 3:30 PM Medical Record Number: [id number] Patient Account Number: [id number] Date of Birth/Sex: 08-07-1930 (85 y.o. M) Treating RN: Cornell Barman Primary Care Kamarian Sahakian: Fulton Reek Other Clinician: Referring Sloane Palmer: Fulton Reek Treating Lyden Redner/Extender: Beverly Gust in Treatment: 30 Vital Signs Height(in): 71 Pulse(bpm): 35 Weight(lbs): 252 Blood Pressure(mmHg): 135/55 Body Mass Index(BMI): 35 Temperature(F): 98.6 Respiratory Rate 18 (breaths/min): Photos: [N/A:N/A] Wound Location: Left Lower Leg - Medial N/A N/A Wounding Event: Blister N/A N/A  Primary Etiology: Venous Leg Ulcer N/A N/A Comorbid History: Lymphedema, Coronary Artery N/A N/A Disease, Hypertension Date Acquired: 10/10/2018 N/A N/A Weeks of Treatment: 9 N/A N/A Wound Status: Open N/A N/A Measurements L x W x D 0.8x0.5x0.1 N/A N/A (cm) Area (cm) : 0.314 N/A N/A Volume (cm) : 0.031 N/A N/A % Reduction in Area: 69.20% N/A N/A % Reduction in Volume: 69.60% N/A N/A Classification: Partial Thickness N/A N/A Exudate Amount: Medium N/A N/A Exudate Type: Serous N/A N/A Exudate Color: amber N/A N/A Wound Margin: Flat and Intact N/A N/A Granulation Amount: Medium (34-66%) N/A N/A Granulation Quality: Red N/A N/A Necrotic Amount: Medium (34-66%) N/A N/A Exposed Structures: Fat Layer (Subcutaneous N/A N/A Tissue) Exposed: Yes Fascia: No Tendon: No Muscle: No Joint: No Bone: No Steven Meza, Steven Meza ([id number]) Epithelialization: None N/A N/A Treatment Notes Electronic Signature(s) Signed: 12/13/2018 5:00:13 PM By: Gretta Cool, BSN, RN, CWS, Kim RN, BSN Entered By: Gretta Cool, BSN, RN, CWS, Kim on 12/13/2018 15:56:39 Steven Meza  ([id number]) -------------------------------------------------------------------------------- Multi-Disciplinary Care Plan Details Patient Name: Steven Meza Date of Service: 12/13/2018 3:30 PM Medical Record Number: [id number] Patient Account Number: [id number] Date of Birth/Sex: December 08, 1930 (85 y.o. M) Treating RN: Cornell Barman Primary Care Catriona Dillenbeck: Fulton Reek Other Clinician: Referring Dena Esperanza: Fulton Reek Treating Michole Lecuyer/Extender: Beverly Gust in Treatment: 12 Active Inactive Orientation to the Wound Care Program Nursing Diagnoses: Knowledge deficit related to the wound healing center program Goals: Patient/caregiver will verbalize understanding of the [facility] Program Date Initiated: 05/17/2018 Target Resolution Date: 06/17/2018 Goal Status: Active Interventions: Provide education on orientation to the wound center Notes: Venous Leg Ulcer Nursing Diagnoses: Potential for venous Insuffiency (use before diagnosis confirmed) Goals: Patient/caregiver will verbalize understanding of disease process and disease management Date Initiated: 05/17/2018 Target Resolution Date: 06/17/2018 Goal Status: Active Interventions: Assess peripheral edema status every visit. Treatment Activities: Non-invasive vascular studies : 05/17/2018 Notes: Wound/Skin Impairment Nursing Diagnoses: Impaired tissue integrity Goals: Patient/caregiver will verbalize understanding of skin care regimen Date Initiated: 05/17/2018 Target Resolution Date: 05/17/2018 Goal Status: Active Steven Meza, Steven Meza ([id number]) Ulcer/skin breakdown will heal within 14 weeks Date Initiated: 05/17/2018 Target Resolution Date: 08/16/2018 Goal Status: Active Interventions: Assess ulceration(s) every visit Treatment Activities: Referred to DME Tomoya Ringwald for dressing supplies : 05/17/2018 Topical wound management initiated : 05/17/2018 Notes: Electronic Signature(s) Signed: 12/13/2018 5:00:13 PM  By: Gretta Cool, BSN, RN, CWS, Kim RN, BSN Entered By: Gretta Cool, BSN, RN, CWS, Kim on 12/13/2018 15:56:29 Steven Meza ([id number]) -------------------------------------------------------------------------------- Pain Assessment Details Patient Name: Steven Meza Date of Service: 12/13/2018 3:30 PM Medical Record Number: [id number] Patient Account Number: [id number] Date of Birth/Sex: May 16, 1930 (85 y.o. M) Treating RN: Harold Barban Primary Care Kennie Snedden: Fulton Reek Other Clinician: Referring Dakai Braithwaite: Fulton Reek Treating Stormie Ventola/Extender: Beverly Gust in Treatment: 30 Active Problems Location of Pain Severity and Description of Pain Patient Has Paino No Site Locations Pain Management and Medication Current Pain Management: Electronic Signature(s) Signed: 12/13/2018 4:01:33 PM By: Harold Barban Entered By: Harold Barban on 12/13/2018 15:37:35 Steven Meza ([id number]) -------------------------------------------------------------------------------- Patient/Caregiver Education Details Patient Name: Steven Meza Date of Service: 12/13/2018 3:30 PM Medical Record Number: [id number] Patient Account Number: [id number] Date of Birth/Gender: Nov 07, 1930 (85 y.o. M) Treating RN: Cornell Barman Primary Care Physician: Fulton Reek Other Clinician: Referring Physician: Fulton Reek Treating Physician/Extender: Beverly Gust in Treatment: 30 Education Assessment Education Provided To: Patient Education Topics Provided Venous: Handouts: Controlling Swelling with Multilayered Compression Wraps Methods: Demonstration, Explain/Verbal Responses: State content correctly Wound/Skin Impairment: Handouts: Caring  for Your Ulcer Methods: Demonstration, Explain/Verbal Responses: State content correctly Electronic Signature(s) Signed: 12/13/2018 5:00:13 PM By: Gretta Cool, BSN, RN, CWS, Kim RN, BSN Entered By: Gretta Cool, BSN, RN, CWS, Kim on 12/13/2018  16:06:11 Steven Meza ([id number]) -------------------------------------------------------------------------------- Wound Assessment Details Patient Name: Steven Meza Date of Service: 12/13/2018 3:30 PM Medical Record Number: [id number] Patient Account Number: [id number] Date of Birth/Sex: 1930/11/10 (85 y.o. M) Treating RN: Harold Barban Primary Care Connell Bognar: Fulton Reek Other Clinician: Referring Ashleyann Shoun: Fulton Reek Treating British Moyd/Extender: Beverly Gust in Treatment: 30 Wound Status Wound Number: 3 Primary Venous Leg Ulcer Etiology: Wound Location: Left Lower Leg - Medial Wound Status: Open Wounding Event: Blister Comorbid Lymphedema, Coronary Artery Disease, Date Acquired: 10/10/2018 History: Hypertension Weeks Of Treatment: 9 Clustered Wound: No Photos Wound Measurements Length: (cm) 0.8 % Reduction in Width: (cm) 0.5 % Reduction in Depth: (cm) 0.1 Epithelializati Area: (cm) 0.314 Tunneling: Volume: (cm) 0.031 Undermining: Area: 69.2% Volume: 69.6% on: None No No Wound Description Classification: Partial Thickness Foul Odor After Wound Margin: Flat and Intact Slough/Fibrino Exudate Amount: Medium Exudate Type: Serous Exudate Color: amber Cleansing: No No Wound Bed Granulation Amount: Medium (34-66%) Exposed Structure Granulation Quality: Red Fascia Exposed: No Necrotic Amount: Medium (34-66%) Fat Layer (Subcutaneous Tissue) Exposed: Yes Necrotic Quality: Adherent Slough Tendon Exposed: No Muscle Exposed: No Joint Exposed: No Bone Exposed: No Treatment Notes Steven Meza, Steven Meza ([id number]) Wound #3 (Left, Medial Lower Leg) Notes silver cell, ABD, 3 layer, donut foam to SDTI, nystatin powder between toes Electronic Signature(s) Signed: 12/13/2018 4:01:33 PM By: Harold Barban Entered By: Harold Barban on 12/13/2018 15:49:42 Steven Meza  ([id number]) -------------------------------------------------------------------------------- Vitals Details Patient Name: Steven Meza Date of Service: 12/13/2018 3:30 PM Medical Record Number: [id number] Patient Account Number: [id number] Date of Birth/Sex: [birth year] (85 y.o. M) Treating RN: Harold Barban Primary Care Aftyn Nott: Fulton Reek Other Clinician: Referring Kendrell Lottman: Fulton Reek Treating Maxximus Gotay/Extender: Beverly Gust in Treatment: 30 Vital Signs Time Taken: 15:38 Temperature (F): 98.6 Height (in): 71 Pulse (bpm): 79 Weight (lbs): 252 Respiratory Rate (breaths/min): 18 Body Mass Index (BMI): 35.1 Blood Pressure (mmHg): 135/55 Reference Range: 80 - 120 mg / dl Electronic Signature(s) Signed: 12/13/2018 4:01:33 PM By: Harold Barban Entered By: Harold Barban on 12/13/2018 15:39:53

## 2018-12-20 ENCOUNTER — Other Ambulatory Visit: Payer: Self-pay

## 2018-12-20 ENCOUNTER — Encounter: Payer: Medicare Other | Admitting: Internal Medicine

## 2018-12-20 DIAGNOSIS — L97822 Non-pressure chronic ulcer of other part of left lower leg with fat layer exposed: Secondary | ICD-10-CM | POA: Diagnosis not present

## 2018-12-21 NOTE — Progress Notes (Signed)
ABEM, SHADDIX ([id number]) Visit Report for 12/20/2018 Arrival Information Details Patient Name: Steven Meza, Steven Meza Date of Service: 12/20/2018 3:30 PM Medical Record Number: [id number] Patient Account Number: [id number] Date of Birth/Sex: 05/05/1930 (85 y.o. M) Treating RN: Montey Hora Primary Care Adrena Nakamura: Fulton Reek Other Clinician: Referring Benny Henrie: Fulton Reek Treating Paislei Dorval/Extender: Tito Dine in Treatment: 76 Visit Information History Since Last Visit Added or deleted any medications: No Patient Arrived: Cane Any new allergies or adverse reactions: No Arrival Time: 15:57 Had a fall or experienced change in No Accompanied By: self activities of daily living that may affect Transfer Assistance: None risk of falls: Patient Identification Verified: Yes Signs or symptoms of abuse/neglect since last visito No Secondary Verification Process Completed: Yes Hospitalized since last visit: No Implantable device outside of the clinic excluding No cellular tissue based products placed in the center since last visit: Has Dressing in Place as Prescribed: Yes Has Compression in Place as Prescribed: Yes Pain Present Now: No Electronic Signature(s) Signed: 12/20/2018 4:23:45 PM By: Montey Hora Entered By: Montey Hora on 12/20/2018 15:58:07 Steven Meza ([id number]) -------------------------------------------------------------------------------- Compression Therapy Details Patient Name: Steven Meza Date of Service: 12/20/2018 3:30 PM Medical Record Number: [id number] Patient Account Number: [id number] Date of Birth/Sex: Sep 10, 1930 (85 y.o. M) Treating RN: Cornell Barman Primary Care Zola Runion: Fulton Reek Other Clinician: Referring Theadore Blunck: Fulton Reek Treating Telesa Jeancharles/Extender: Tito Dine in Treatment: 31 Compression Therapy Performed for Wound Assessment: Wound #3 Left,Medial Lower Leg Performed By: Clinician Cornell Barman,  RN Compression Type: Three Layer Post Procedure Diagnosis Same as Pre-procedure Electronic Signature(s) Signed: 12/20/2018 5:55:20 PM By: Gretta Cool, BSN, RN, CWS, Kim RN, BSN Entered By: Gretta Cool, BSN, RN, CWS, Kim on 12/20/2018 16:35:38 Steven Meza ([id number]) -------------------------------------------------------------------------------- Encounter Discharge Information Details Patient Name: Steven Meza Date of Service: 12/20/2018 3:30 PM Medical Record Number: [id number] Patient Account Number: [id number] Date of Birth/Sex: [birth year] (85 y.o. M) Treating RN: Cornell Barman Primary Care Allana Shrestha: Fulton Reek Other Clinician: Referring Keren Alverio: Fulton Reek Treating Shatoria Stooksbury/Extender: Tito Dine in Treatment: 41 Encounter Discharge Information Items Post Procedure Vitals Discharge Condition: Stable Temperature (F): 99.3 Ambulatory Status: Ambulatory Pulse (bpm): 93 Discharge Destination: Home Respiratory Rate (breaths/min): 16 Transportation: Private Auto Blood Pressure (mmHg): 140/62 Accompanied By: self Schedule Follow-up Appointment: Yes Clinical Summary of Care: Electronic Signature(s) Signed: 12/20/2018 5:55:20 PM By: Gretta Cool, BSN, RN, CWS, Kim RN, BSN Entered By: Gretta Cool, BSN, RN, CWS, Kim on 12/20/2018 16:42:10 Steven Meza ([id number]) -------------------------------------------------------------------------------- Lower Extremity Assessment Details Patient Name: Steven Meza Date of Service: 12/20/2018 3:30 PM Medical Record Number: [id number] Patient Account Number: [id number] Date of Birth/Sex: [birth year] (85 y.o. M) Treating RN: Montey Hora Primary Care Serine Kea: Fulton Reek Other Clinician: Referring Jiovani Mccammon: Fulton Reek Treating Rayce Brahmbhatt/Extender: Tito Dine in Treatment: 31 Edema Assessment Assessed: [Left: No] [Right: No] Edema: [Left: Ye] [Right: s] Calf Left: Right: Point of Measurement: 32 cm From  Medial Instep 42.6 cm cm Ankle Left: Right: Point of Measurement: 11 cm From Medial Instep 27.7 cm cm Vascular Assessment Pulses: Dorsalis Pedis Palpable: [Left:Yes] Electronic Signature(s) Signed: 12/20/2018 4:23:45 PM By: Montey Hora Entered By: Montey Hora on 12/20/2018 16:04:35 Steven Meza ([id number]) -------------------------------------------------------------------------------- Multi Wound Chart Details Patient Name: Steven Meza Date of Service: 12/20/2018 3:30 PM Medical Record Number: [id number] Patient Account Number: [id number] Date of Birth/Sex: [birth year] (86 y.o. M) Treating RN: Primary Care Dickie Labarre: Fulton Reek Other Clinician: Referring Ciani Rutten: Fulton Reek Treating Patsey Pitstick/Extender: Tito Dine  in Treatment: 31 Vital Signs Height(in): 71 Pulse(bpm): 93 Weight(lbs): 252 Blood Pressure(mmHg): 140/62 Body Mass Index(BMI): 35 Temperature(F): 99.3 Respiratory Rate 20 (breaths/min): Photos: [N/A:N/A] Wound Location: Left Lower Leg - Medial Left Foot - Dorsal N/A Wounding Event: Blister Pressure Injury N/A Primary Etiology: Venous Leg Ulcer Pressure Ulcer N/A Comorbid History: Lymphedema, Coronary Artery Lymphedema, Coronary Artery N/A Disease, Hypertension Disease, Hypertension Date Acquired: 10/10/2018 12/08/2018 N/A Weeks of Treatment: 10 0 N/A Wound Status: Open Open N/A Measurements L x W x D 0.7x0.4x0.1 0.6x0.4x0.1 N/A (cm) Area (cm) : 0.22 0.188 N/A Volume (cm) : 0.022 0.019 N/A % Reduction in Area: 78.50% 0.00% N/A % Reduction in Volume: 78.40% 0.00% N/A Classification: Partial Thickness Category/Stage II N/A Exudate Amount: Medium Small N/A Exudate Type: Serous Serous N/A Exudate Color: amber amber N/A Wound Margin: Flat and Intact Flat and Intact N/A Granulation Amount: None Present (0%) Small (1-33%) N/A Granulation Quality: N/A Red N/A Necrotic Amount: Large (67-100%) Large (67-100%) N/A Necrotic Tissue:  Adherent Slough Eschar N/A Exposed Structures: Fat Layer (Subcutaneous Fat Layer (Subcutaneous N/A Tissue) Exposed: Yes Tissue) Exposed: Yes Fascia: No Tendon: No Muscle: No Steven Meza ([id number]) Joint: No Bone: No Epithelialization: None Small (1-33%) N/A Treatment Notes Electronic Signature(s) Signed: 12/20/2018 5:55:20 PM By: Gretta Cool, BSN, RN, CWS, Kim RN, BSN Entered By: Gretta Cool, BSN, RN, CWS, Kim on 12/20/2018 16:34:43 Steven Meza ([id number]) -------------------------------------------------------------------------------- Multi-Disciplinary Care Plan Details Patient Name: Steven Meza Date of Service: 12/20/2018 3:30 PM Medical Record Number: [id number] Patient Account Number: [id number] Date of Birth/Sex: [birth year] (85 y.o. M) Treating RN: Cornell Barman Primary Care Keiyon Plack: Fulton Reek Other Clinician: Referring Jacobb Alen: Fulton Reek Treating Finneus Kaneshiro/Extender: Tito Dine in Treatment: 66 Active Inactive Orientation to the Wound Care Program Nursing Diagnoses: Knowledge deficit related to the wound healing center program Goals: Patient/caregiver will verbalize understanding of the [place] Date Initiated: 05/17/2018 Target Resolution Date: 06/17/2018 Goal Status: Active Interventions: Provide education on orientation to the wound center Notes: Venous Leg Ulcer Nursing Diagnoses: Potential for venous Insuffiency (use before diagnosis confirmed) Goals: Patient/caregiver will verbalize understanding of disease process and disease management Date Initiated: 05/17/2018 Target Resolution Date: 06/17/2018 Goal Status: Active Interventions: Assess peripheral edema status every visit. Treatment Activities: Non-invasive vascular studies : 05/17/2018 Notes: Wound/Skin Impairment Nursing Diagnoses: Impaired tissue integrity Goals: Patient/caregiver will verbalize understanding of skin care regimen Date Initiated:  05/17/2018 Target Resolution Date: 05/17/2018 Goal Status: Active DERRIUS, FURTICK ([id number]) Ulcer/skin breakdown will heal within 14 weeks Date Initiated: 05/17/2018 Target Resolution Date: 08/16/2018 Goal Status: Active Interventions: Assess ulceration(s) every visit Treatment Activities: Referred to DME Gwynn Crossley for dressing supplies : 05/17/2018 Topical wound management initiated : 05/17/2018 Notes: Electronic Signature(s) Signed: 12/20/2018 5:55:20 PM By: Gretta Cool, BSN, RN, CWS, Kim RN, BSN Entered By: Gretta Cool, BSN, RN, CWS, Kim on 12/20/2018 16:34:34 Steven Meza ([id number]) -------------------------------------------------------------------------------- Non-Wound Condition Assessment Details Patient Name: Steven Meza Date of Service: 12/20/2018 3:30 PM Medical Record Number: [id number] Patient Account Number: [id number] Date of Birth/Sex: [birth year] (85 y.o. M) Treating RN: Montey Hora Primary Care Evert Wenrich: Fulton Reek Other Clinician: Referring Alysah Carton: Fulton Reek Treating Carlis Blanchard/Extender: Ricard Dillon Weeks in Treatment: 31 Non-Wound Condition: Condition: Suspected Deep Tissue Injury Location: Foot Side: Left Photos Electronic Signature(s) Signed: 12/20/2018 4:23:45 PM By: Montey Hora Entered By: Montey Hora on 12/20/2018 16:08:59 Steven Meza ([id number]) -------------------------------------------------------------------------------- Pain Assessment Details Patient Name: Steven Meza Date of Service: 12/20/2018 3:30 PM Medical Record Number: [id number] Patient Account Number:  [id number] Date of Birth/Sex: 1930-10-31 (85 y.o. M) Treating RN: Montey Hora Primary Care Phelan Goers: Fulton Reek Other Clinician: Referring Yuka Lallier: Fulton Reek Treating Shravan Salahuddin/Extender: Tito Dine in Treatment: 31 Active Problems Location of Pain Severity and Description of Pain Patient Has Paino Yes Site Locations Pain  Location: Generalized Pain With Dressing Change: No Duration of the Pain. Constant / Intermittento Constant Pain Management and Medication Current Pain Management: Electronic Signature(s) Signed: 12/20/2018 4:23:45 PM By: Montey Hora Entered By: Montey Hora on 12/20/2018 16:00:10 Steven Meza ([id number]) -------------------------------------------------------------------------------- Patient/Caregiver Education Details Patient Name: Steven Meza Date of Service: 12/20/2018 3:30 PM Medical Record Number: [id number] Patient Account Number: [id number] Date of Birth/Gender: [birth year] (85 y.o. M) Treating RN: Cornell Barman Primary Care Physician: Fulton Reek Other Clinician: Referring Physician: Fulton Reek Treating Physician/Extender: Tito Dine in Treatment: 10 Education Assessment Education Provided To: Patient Education Topics Provided Pressure: Handouts: Pressure Ulcers: Care and Offloading Methods: Demonstration, Explain/Verbal Responses: State content correctly Wound/Skin Impairment: Handouts: Caring for Your Ulcer Methods: Demonstration, Explain/Verbal Responses: State content correctly Electronic Signature(s) Signed: 12/20/2018 5:55:20 PM By: Gretta Cool, BSN, RN, CWS, Kim RN, BSN Entered By: Gretta Cool, BSN, RN, CWS, Kim on 12/20/2018 16:40:46 Steven Meza ([id number]) -------------------------------------------------------------------------------- Wound Assessment Details Patient Name: Steven Meza Date of Service: 12/20/2018 3:30 PM Medical Record Number: [id number] Patient Account Number: [id number] Date of Birth/Sex: [birth year] (85 y.o. M) Treating RN: Montey Hora Primary Care Afsheen Antony: Fulton Reek Other Clinician: Referring Collyns Mcquigg: Fulton Reek Treating Rosa Wyly/Extender: Tito Dine in Treatment: 31 Wound Status Wound Number: 3 Primary Venous Leg Ulcer Etiology: Wound Location: Left Lower Leg -  Medial Wound Status: Open Wounding Event: Blister Comorbid Lymphedema, Coronary Artery Disease, Date Acquired: 10/10/2018 History: Hypertension Weeks Of Treatment: 10 Clustered Wound: No Photos Wound Measurements Length: (cm) 0.7 Width: (cm) 0.4 Depth: (cm) 0.1 Area: (cm) 0.22 Volume: (cm) 0.022 % Reduction in Area: 78.5% % Reduction in Volume: 78.4% Epithelialization: None Tunneling: No Undermining: No Wound Description Classification: Partial Thickness Foul Odor Af Wound Margin: Flat and Intact Slough/Fibri Exudate Amount: Medium Exudate Type: Serous Exudate Color: amber ter Cleansing: No no No Wound Bed Granulation Amount: None Present (0%) Exposed Structure Necrotic Amount: Large (67-100%) Fascia Exposed: No Necrotic Quality: Adherent Slough Fat Layer (Subcutaneous Tissue) Exposed: Yes Tendon Exposed: No Muscle Exposed: No Joint Exposed: No Bone Exposed: No Treatment Notes DRAYCEN, LEICHTER ([id number]) Wound #3 (Left, Medial Lower Leg) Notes S. cell, abd/foam, 3ll Electronic Signature(s) Signed: 12/20/2018 4:23:45 PM By: Montey Hora Entered By: Montey Hora on 12/20/2018 16:08:37 Steven Meza ([id number]) -------------------------------------------------------------------------------- Wound Assessment Details Patient Name: Steven Meza Date of Service: 12/20/2018 3:30 PM Medical Record Number: [id number] Patient Account Number: [id number] Date of Birth/Sex: 31-Jul-1930 (85 y.o. M) Treating RN: Cornell Barman Primary Care Kahlil Cowans: Fulton Reek Other Clinician: Referring Concetta Guion: Fulton Reek Treating Jackob Crookston/Extender: Tito Dine in Treatment: 31 Wound Status Wound Number: 5 Primary Pressure Ulcer Etiology: Wound Location: Left Foot - Dorsal Wound Status: Open Wounding Event: Pressure Injury Comorbid Lymphedema, Coronary Artery Disease, Date Acquired: 12/08/2018 History: Hypertension Weeks Of Treatment: 0 Clustered Wound:  No Photos Wound Measurements Length: (cm) 0.6 Width: (cm) 0.4 Depth: (cm) 0.1 Area: (cm) 0.188 Volume: (cm) 0.019 % Reduction in Area: 0% % Reduction in Volume: 0% Epithelialization: Small (1-33%) Tunneling: No Undermining: No Wound Description Classification: Category/Stage II Wound Margin: Flat and Intact Exudate Amount: Small Exudate Type: Serous Exudate Color: amber Foul Odor After Cleansing: No Slough/Fibrino No Wound Bed  Granulation Amount: Small (1-33%) Exposed Structure Granulation Quality: Red Fat Layer (Subcutaneous Tissue) Exposed: Yes Necrotic Amount: Large (67-100%) Necrotic Quality: Estate manager/land agent Signature(s) Signed: 12/20/2018 5:55:20 PM By: Gretta Cool, BSN, RN, CWS, Kim RN, BSN Entered By: Gretta Cool, BSN, RN, CWS, Kim on 12/20/2018 16:34:21 Steven Meza ([id number]) Steven Meza ([id number]) -------------------------------------------------------------------------------- Vitals Details Patient Name: Steven Meza Date of Service: 12/20/2018 3:30 PM Medical Record Number: [id number] Patient Account Number: [id number] Date of Birth/Sex: [birth year] (85 y.o. M) Treating RN: Montey Hora Primary Care Aquita Simmering: Fulton Reek Other Clinician: Referring Rashida Ladouceur: Fulton Reek Treating Italo Banton/Extender: Tito Dine in Treatment: 31 Vital Signs Time Taken: 16:02 Temperature (F): 99.3 Height (in): 71 Pulse (bpm): 93 Weight (lbs): 252 Respiratory Rate (breaths/min): 20 Body Mass Index (BMI): 35.1 Blood Pressure (mmHg): 140/62 Reference Range: 80 - 120 mg / dl Electronic Signature(s) Signed: 12/20/2018 4:23:45 PM By: Montey Hora Entered By: Montey Hora on 12/20/2018 16:03:00

## 2018-12-21 NOTE — Progress Notes (Signed)
Steven Meza, Steven Meza ([id number]) Visit Report for 12/20/2018 Debridement Details Patient Name: Steven Meza Date of Service: 12/20/2018 3:30 PM Medical Record Number: [id number] Patient Account Number: [id number] Date of Birth/Sex: May 28, 1930 (85 y.o. M) Treating RN: Primary Care Provider: Fulton Reek Other Clinician: Referring Provider: Fulton Reek Treating Provider/Extender: Tito Dine in Treatment: 31 Debridement Performed for Wound #5 Left,Plantar Foot Assessment: Performed By: Physician Ricard Dillon, MD Debridement Type: Debridement Level of Consciousness (Pre- Awake and Alert procedure): Pre-procedure Verification/Time No Out Taken: Start Time: 16:00 Pain Control: Lidocaine Total Area Debrided (L x W): 0.6 (cm) x 0.4 (cm) = 0.24 (cm) Tissue and other material Non-Viable, Eschar debrided: Level: Non-Viable Tissue Debridement Description: Selective/Open Wound Instrument: Blade, Forceps Bleeding: None End Time: 16:09 Response to Treatment: Procedure was tolerated well Level of Consciousness Awake and Alert (Post-procedure): Post Debridement Measurements of Total Wound Length: (cm) 0.6 Stage: Category/Stage II Width: (cm) 0.4 Depth: (cm) 0.1 Volume: (cm) 0.019 Character of Wound/Ulcer Post Requires Further Debridement Debridement: Post Procedure Diagnosis Same as Pre-procedure Electronic Signature(s) Signed: 12/20/2018 5:33:48 PM By: Linton Ham MD Entered By: Linton Ham on 12/20/2018 16:43:38 Steven Meza ([id number]) -------------------------------------------------------------------------------- HPI Details Patient Name: Steven Meza Date of Service: 12/20/2018 3:30 PM Medical Record Number: [id number] Patient Account Number: [id number] Date of Birth/Sex: 01-Jul-1930 (85 y.o. M) Treating RN: Primary Care Provider: Fulton Reek Other Clinician: Referring Provider: Fulton Reek Treating Provider/Extender:  Tito Dine in Treatment: 31 History of Present Illness HPI Description: ADMISSION 05/17/2018 Steven Meza is an 85 year old man who is been treated at the lymphedema clinic for a long period with lymphedema wraps for bilateral lower extremity lymphedema. About 6 months ago they noted small open areas on his left lateral calf just above the ankle. These were open. They are apparently putting some form of ointment on this. They have been referred here for our review of this. The patient has a long history of lymphedema and venous stasis. It says in his records that he has recurrent cellulitis although his wife denies this but she states that the lymphedema may have started with cellulitis several years ago. Also in his record there is a history of bladder cancer which they seem to know very little about however he did not have any radiation to the pelvis or anything that could have contributed to lymphedema that they are aware of. There is no prior wound history. The patient has 2 small punched out areas on the left lateral calf that have adherent debris on the surface. Past medical history; lymphedema, hypertension, cellulitis, hearing loss, morbid obesity, osteoarthritis, venous stasis, bladder CA, diverticulosis. ABIs in our clinic were 0.36 on the right and 0.57 on the left 05/24/2018; corrected age on this patient is 45 versus what I said last week. He has been for his arterial studies which predictably are not very good. On the right his ABI is 0.65. Monophasic waveforms noted at the right ankle including the posterior tibial and dorsalis pedis. He has triphasic waveforms of the common femoral and profunda femoris triphasic proximal SFA with monophasic distal FSA and popliteal artery. Monophasic tibial waveforms. On the left his ABI is 0.58 monophasic waveforms at the left ankle. Duplex of the left lower extremity demonstrated atherosclerotic change with monophasic waveforms  throughout the left lower extremity. There was some concern about left iliac disease and potentially femoral-popliteal and tibial disease. The patient does not describe claudication although according to his wife he over emphasizes his activity. Patient  states he is limited by pain in both knees His wounds are on the left lateral calf. 2 small areas with necrotic debris. We have been using silver collagen and light Ace wraps 05/31/2018; wounds on the left lateral calf in the setting of very significant lymphedema and probably PAD. We have been using silver collagen. The base of the small wound looks reasonably improved. I sent him to see Dr. Fletcher Anon however I see now he has an appointment with Dr. Alvester Chou on February 12 2/5; left lateral calf wound looks the same. His wife is doing a good job with her lymphedema wraps and maintaining that the edema. He most likely has PAD and is due to see Dr. Gwenlyn Found of infectious disease on February 12 2/19; left lateral calf wounds look about the same. This looks like wound secondary to chronic venous insufficiency with lymphedema however the patient has very poor arterial studies. We referred him to Dr. Gwenlyn Found. Dr. Gwenlyn Found did not feel he needed to do anything from an arterial point of view. He did not address the question about the aggressiveness of compression. After some thoughts about this I elected to go ahead and put him in 3 layer compression which after all would be less compression then the lymphedema clinic was putting on this. I am hopeful that this should be enough to get some closure of the small wounds 2/26; left lateral calf wound letter this week. He tolerated the 3 layer compression well furthermore he is sleeping in a hospital bed which helps keep his legs up at night. The wound looks better measuring smaller especially in width 3/3; left lateral calf wound about the same size. The 3 layer compression has really helped get the edema down in his  leg however. Using silver collagen 3/11; left lateral calf wound better looking wound surface but about the same size. We have been using 3 layer compression which is really helped get the edema down in the left leg. Even after Dr. Kennon Holter assessment I am reluctant to go to 4 layer compression. He is tolerating 3 layer compression well. We have been using silver collagen Steven Meza, Steven Meza ([id number]) 3/18-Patient returns for his left lateral calf wound which overall is looking better, slightly increased in size and dimensions and area, he is tolerating the 3 layer compression, has been dressed with silver collagen which we will continue 3/25oleft lateral calf wound much better looking. Size is smaller. He has been using silver collagen 4/1; gradually getting smaller in size. Using silver collagen with 3 layer compression. I think the patient has some degree of PAD. He saw Dr. Gwenlyn Found in consultation, he did not specifically comment on whether he could tolerate 4 layer compression 4/8; the wound is slightly smaller in depth. We are using 3 layer compression with silver collagen. I had him seen for arterial insufficiency however his ABI was 0.57 in the left leg. Dr. Gwenlyn Found really did not seem to middle on the degree of compression he could tolerate 4/15; the wound is slightly smaller and slightly less deep. We have been using 3 layer compression with silver collagen. There is not an option for 4-layer compression here. 4/22; the patient is making decent progress on his wound on the left lateral calf however he arrives in with a superficial wound on the right anterior tibial area. Were not really sure how this happened. He had been using a stocking on the right leg. He has been using silver collagen on the wounds Amazing to me  he is he appears to have compression pumps at home. I am not really sure I knew this before. In any case he has not been using them 4/29; left lateral calf wound continues to  get gradually smaller. This looks like it is on its way to closure. oThe area on the right anterior tibial area is about the same in terms of size superficial without any depth. This was probably wrap injury oHe tells me that he has been using his compression pumps once a day for 1 hour without any pain 5/6; not as good today. Left lateral calf wound is actually larger and he does not have quite as good edema control. The area on the right anterior tibia is about the same still superficial. He states he is using his compression pumps once a day. We are using 3 layer compression on the left and Curlex Coban on the right 5/13; the area on the right is very close to being healed. The area on the left still has some depth but has a healthy wound surface. He has excellent edema control bilaterally. Using Hydrofera Blue 5/20-Patient returns in 1 week after being in 3 layer compression on the left, the left lateral calf ulcer is stable with some slough, the right anterior tibial ulcer is healed we have been using Kerlix Coban on that leg 5/27; Hydrofera Blue and 3 layer compression. Still some slough in the wound area and some nonviable edges around the wound. 6/3; this patient has lymphedema and chronic venous insufficiency. He had difficult to close wound on his left lateral calf. This is closed today. 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 Finally he has known PAD but he is managed to tolerate the compression we reporting on him and also using his compression pumps that he already had at home twice a day 6/10; we discharge this patient last week having finally close the small area in the left lateral calf. Apparently by Friday of last week this had opened and was draining. He is therefore back in clinic. His wife who is present also was concerned about an area on the right anterior tibia."o 6/17; the patient does not have an open area on the right leg. On the  left he has a small circular area anteriorly and the same area laterally that he had last time. However both of these appear to be improved he has his Farrow wrap 30/40. It turns out that he is only using his external compression pumps once a day. I tried to get him up to twice a day today 6/24; the patient comes in with his external compression garment on the right leg/Farrow wrap. As far as we know no open wounds here. He has 2 areas that are smaller one on the left anterior pretibial and one on the left lateral calf which is the site of his initial wounds. I have successfully caught him into his external compression pumps twice a day 7/1; still using his external compression garment on the right leg. As far as we know there are no open wounds here. The area on the left lateral calf which was the site of his initial wound has closed over. The area on the left anterior pretibial that we identified last week is still open. 7/8; as far as we know he has no open wounds on the right leg. He has his external compression garment on here. The original wound on the left lateral calf has closed  over. He developed an area on the left anterior pretibial 2 weeks ago and seems to have 2 smaller areas on the proximal and distal lateral calf. We do not have his good edema control this week as I am used to seeing 7/15; the patient has still a linear area on the left anterior tibia. The area on the left lateral leg looked somewhat inflamed today but no cellulitis. He did have what looked to be a small pustule that I cultured for CandS although I did not give him empiric antibiotics. 7/22; small area on the left anterior tibia. The area on the left lateral leg still looks inflamed. The pustule that I cultured last week grew Enterobacter and Pseudomonas. I prescribed Cipro they still have not picked that up. In any case the area actually looks fairly satisfactory. He has decent edema control using his compression  pumps twice a day 7/29; the area on the left lateral leg looks a lot better than last week. Nothing is open here. He has an area just medial to the mid anterior tibia that is still open that is only open wound today. 8/5-Patient returns at 1 week for the left leg ulcer, which is actually looking better, he is also complaining of left plantar foot pain below the great toe. He does have a hematoma here Steven Meza, Steven Meza ([id number]) 8/12-Patient returns at 1 week, the left leg open area looks about the same if not slightly better, the left plantar hematoma under the great toe is about the same, patient apparently has not been resting his foot on anything for too long period of time and does not wear the [employer]ken[employer] shoes unless he walks outdoors which he does not do often 8/19; small wound on the left anterior lower leg this is just about closed there is nothing on the left lateral leg. He is using his compression pumps He arrives in clinic today with a black eschar over the right first plantar metatarsal head. This was apparently a bruise that was identified by her nurses last week. He wears crocs on his feet. He also may rub these on a footboard at home Electronic Signature(s) Signed: 12/20/2018 5:33:48 PM By: Linton Ham MD Entered By: Linton Ham on 12/20/2018 16:32:40 Steven Meza ([id number]) -------------------------------------------------------------------------------- Physical Exam Details Patient Name: Steven Meza Date of Service: 12/20/2018 3:30 PM Medical Record Number: [id number] Patient Account Number: [id number] Date of Birth/Sex: 18-Aug-1930 (85 y.o. M) Treating RN: Primary Care Provider: Fulton Reek Other Clinician: Referring Provider: Fulton Reek Treating Provider/Extender: Tito Dine in Treatment: 31 Constitutional Sitting or standing Blood Pressure is within target range for patient.. Pulse regular and within target range for  patient.Marland Kitchen Respirations regular, non-labored and within target range.. Temperature is normal and within the target range for the patient.Marland Kitchen appears in no distress. Eyes Conjunctivae clear. No discharge. Ears, Nose, Mouth, and Throat Patient is hard of hearing.Marland Kitchen Respiratory Respiratory effort is easy and symmetric bilaterally. Rate is normal at rest and on room air.. Cardiovascular Pedal pulses are palpable. Integumentary (Hair, Skin) Severe bilateral lymphedema with cobblestone skin on the entire left lower leg this is not new. There is no evidence of cellulitis. Psychiatric No evidence of depression, anxiety, or agitation. Calm, cooperative, and communicative. Appropriate interactions and affect.. Notes Wound exam; the left leg anteriorly in the pre-tibia area is small I do not think this is completely closed but it is close to that there is nothing else open that I can identify. There is  nothing on the right leg either we did not remove his juxta light oOn the fifth metatarsal head he had a very painful callus that had hemorrhagic material underneath it. Using pickups and a #15 scalpel I remove this he has 2 small open areas underneath it. This is not as bad as I first feared Electronic Signature(s) Signed: 12/20/2018 5:33:48 PM By: Linton Ham MD Entered By: Linton Ham on 12/20/2018 16:37:23 Steven Meza ([id number]) -------------------------------------------------------------------------------- Physician Orders Details Patient Name: Steven Meza Date of Service: 12/20/2018 3:30 PM Medical Record Number: [id number] Patient Account Number: [id number] Date of Birth/Sex: 12-04-1930 (85 y.o. 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 Wound #5 Left,Dorsal Foot o Topical Lidocaine 4% cream applied to wound bed prior to debridement (In Clinic Only). Skin Barriers/Peri-Wound Care o Antifungal powder-Nystatin - on toes to help with wetness, Primary Wound Dressing Wound #3 Left,Medial Lower Leg o Silver Alginate Wound #5 Left,Dorsal Foot o Silver Alginate Secondary Dressing Wound #3 Left,Medial Lower Leg o ABD pad Wound #5 Left,Dorsal Foot o Foam Dressing Change Frequency Wound #3 Left,Medial Lower Leg o Change dressing every week - In office Wound #5 Left,Dorsal Foot NICHOLSON, STARACE ([id number]) o Change dressing every week - In office Follow-up Appointments Wound #3 Left,Medial Lower Leg o Return Appointment in 1 week. Wound #5 Left,Dorsal Foot o Return Appointment in 1 week. Edema Control Wound #3 Left,Medial Lower Leg o 3 Layer Compression System - Left Lower Extremity o Patient to wear own Velcro compression garment. - Right o Compression Pump: Use compression pump on left lower extremity for 60 minutes, twice daily. o Compression Pump: Use compression pump on right lower extremity for 60 minutes, twice daily. Wound #5 Left,Dorsal Foot o 3 Layer Compression System - Left Lower  Extremity o Patient to wear own Velcro compression garment. - Right o Compression Pump: Use compression pump on left lower extremity for 60 minutes, twice daily. o Compression Pump: Use compression pump on right lower extremity for 60 minutes, twice daily. Off-Loading Wound #3 Left,Medial Lower Leg o Other: - Do not sleep in recliner, sleep in bed with legs elevated. Do not allow pressure on the bottom of the foot. Electronic Signature(s) Signed: 12/20/2018 5:33:48 PM By: Linton Ham MD Signed: 12/20/2018 5:55:20 PM By: Gretta Cool, BSN, RN, CWS, Kim RN, BSN Entered By: Gretta Cool, BSN, RN, CWS, Kim on 12/20/2018 16:37:11 Steven Meza ([id number]) -------------------------------------------------------------------------------- Problem List Details Patient Name: Steven Meza Date of Service: 12/20/2018 3:30 PM Medical Record Number: [id number] Patient Account Number: [id number] Date of Birth/Sex: 1930/11/07 (85 y.o. M) Treating RN: Primary Care Provider: Fulton Reek Other Clinician: Referring Provider: Fulton Reek Treating Provider/Extender: Tito Dine in Treatment: 31 Active Problems  ICD-10 Evaluated Encounter Meza Description Active Date Today Diagnosis L97.228 Non-pressure chronic ulcer of left calf with other specified 05/17/2018 No Yes severity I89.0 Lymphedema, not elsewhere classified 05/17/2018 No Yes I70.212 Atherosclerosis of native arteries of extremities with 05/17/2018 No Yes intermittent claudication, left leg L97.518 Non-pressure chronic ulcer of other part of right foot with 12/20/2018 No Yes other specified severity Inactive Problems ICD-10 Meza Description Active Date Inactive Date L97.211 Non-pressure chronic ulcer of right calf limited to breakdown of skin 08/23/2018 08/23/2018 Resolved Problems Electronic Signature(s) Signed: 12/20/2018 5:33:48 PM By: Linton Ham MD Entered By: Linton Ham on 12/20/2018 16:30:36 Steven Meza  ([id number]) -------------------------------------------------------------------------------- Progress Note Details Patient Name: Steven Meza Date of Service: 12/20/2018 3:30 PM Medical Record Number: [id number] Patient Account Number: [id number] Date of Birth/Sex: April 16, 1931 (85 y.o. M) Treating RN: Primary Care Provider: Fulton Reek Other Clinician: Referring Provider: Fulton Reek Treating Provider/Extender: Tito Dine in Treatment: 31 Subjective History of Present Illness (HPI) ADMISSION 05/17/2018 Mr. Greenup is an 85 year old man who is been treated at the lymphedema clinic for a long period with lymphedema wraps for bilateral lower extremity lymphedema. About 6 months ago they noted small open areas on his left lateral calf just above the ankle. These were open. They are apparently putting some form of ointment on this. They have been referred here for our review of this. The patient has a long history of lymphedema and venous stasis. It says in his records that he has recurrent cellulitis although his wife denies this but she states that the lymphedema may have started with cellulitis several years ago. Also in his record there is a history of bladder cancer which they seem to know very little about however he did not have any radiation to the pelvis or anything that could have contributed to lymphedema that they are aware of. There is no prior wound history. The patient has 2 small punched out areas on the left lateral calf that have adherent debris on the surface. Past medical history; lymphedema, hypertension, cellulitis, hearing loss, morbid obesity, osteoarthritis, venous stasis, bladder CA, diverticulosis. ABIs in our clinic were 0.36 on the right and 0.57 on the left 05/24/2018; corrected age on this patient is 65 versus what I said last week. He has been for his arterial studies which predictably are not very good. On the right his ABI is 0.65. Monophasic  waveforms noted at the right ankle including the posterior tibial and dorsalis pedis. He has triphasic waveforms of the common femoral and profunda femoris triphasic proximal SFA with monophasic distal FSA and popliteal artery. Monophasic tibial waveforms. On the left his ABI is 0.58 monophasic waveforms at the left ankle. Duplex of the left lower extremity demonstrated atherosclerotic change with monophasic waveforms throughout the left lower extremity. There was some concern about left iliac disease and potentially femoral-popliteal and tibial disease. The patient does not describe claudication although according to his wife he over emphasizes his activity. Patient states he is limited by pain in both knees His wounds are on the left lateral calf. 2 small areas with necrotic debris. We have been using silver collagen and light Ace wraps 05/31/2018; wounds on the left lateral calf in the setting of very significant lymphedema and probably PAD. We have been using silver collagen. The base of the small wound looks reasonably improved. I sent him to see Dr. Fletcher Anon however I see now he has an appointment with Dr. Alvester Chou on February 12 2/5; left lateral calf wound  looks the same. His wife is doing a good job with her lymphedema wraps and maintaining that the edema. He most likely has PAD and is due to see Dr. Gwenlyn Found of infectious disease on February 12 2/19; left lateral calf wounds look about the same. This looks like wound secondary to chronic venous insufficiency with lymphedema however the patient has very poor arterial studies. We referred him to Dr. Gwenlyn Found. Dr. Gwenlyn Found did not feel he needed to do anything from an arterial point of view. He did not address the question about the aggressiveness of compression. After some thoughts about this I elected to go ahead and put him in 3 layer compression which after all would be less compression then the lymphedema clinic was putting on this. I am hopeful that  this should be enough to get some closure of the small wounds 2/26; left lateral calf wound letter this week. He tolerated the 3 layer compression well furthermore he is sleeping in a hospital bed which helps keep his legs up at night. The wound looks better measuring smaller especially in width 3/3; left lateral calf wound about the same size. The 3 layer compression has really helped get the edema down in his leg however. Using silver collagen 3/11; left lateral calf wound better looking wound surface but about the same size. We have been using 3 layer compression which is really helped get the edema down in the left leg. Even after Dr. Kennon Holter assessment I am reluctant to go to 4 layer Steven Meza, Steven A. ([id number]) compression. He is tolerating 3 layer compression well. We have been using silver collagen 3/18-Patient returns for his left lateral calf wound which overall is looking better, slightly increased in size and dimensions and area, he is tolerating the 3 layer compression, has been dressed with silver collagen which we will continue 3/25 left lateral calf wound much better looking. Size is smaller. He has been using silver collagen 4/1; gradually getting smaller in size. Using silver collagen with 3 layer compression. I think the patient has some degree of PAD. He saw Dr. Gwenlyn Found in consultation, he did not specifically comment on whether he could tolerate 4 layer compression 4/8; the wound is slightly smaller in depth. We are using 3 layer compression with silver collagen. I had him seen for arterial insufficiency however his ABI was 0.57 in the left leg. Dr. Gwenlyn Found really did not seem to middle on the degree of compression he could tolerate 4/15; the wound is slightly smaller and slightly less deep. We have been using 3 layer compression with silver collagen. There is not an option for 4-layer compression here. 4/22; the patient is making decent progress on his wound on the left lateral  calf however he arrives in with a superficial wound on the right anterior tibial area. Were not really sure how this happened. He had been using a stocking on the right leg. He has been using silver collagen on the wounds Amazing to me he is he appears to have compression pumps at home. I am not really sure I knew this before. In any case he has not been using them 4/29; left lateral calf wound continues to get gradually smaller. This looks like it is on its way to closure. The area on the right anterior tibial area is about the same in terms of size superficial without any depth. This was probably wrap injury He tells me that he has been using his compression pumps once a day for 1  hour without any pain 5/6; not as good today. Left lateral calf wound is actually larger and he does not have quite as good edema control. The area on the right anterior tibia is about the same still superficial. He states he is using his compression pumps once a day. We are using 3 layer compression on the left and Curlex Coban on the right 5/13; the area on the right is very close to being healed. The area on the left still has some depth but has a healthy wound surface. He has excellent edema control bilaterally. Using Hydrofera Blue 5/20-Patient returns in 1 week after being in 3 layer compression on the left, the left lateral calf ulcer is stable with some slough, the right anterior tibial ulcer is healed we have been using Kerlix Coban on that leg 5/27; Hydrofera Blue and 3 layer compression. Still some slough in the wound area and some nonviable edges around the wound. 6/3; this patient has lymphedema and chronic venous insufficiency. He had difficult to close wound on his left lateral calf. This is closed today. 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 Finally he has known PAD but he is managed to tolerate the compression we reporting on him and also using his  compression pumps that he already had at home twice a day 6/10; we discharge this patient last week having finally close the small area in the left lateral calf. Apparently by Friday of last week this had opened and was draining. He is therefore back in clinic. His wife who is present also was concerned about an area on the right anterior tibia."o 6/17; the patient does not have an open area on the right leg. On the left he has a small circular area anteriorly and the same area laterally that he had last time. However both of these appear to be improved he has his Farrow wrap 30/40. It turns out that he is only using his external compression pumps once a day. I tried to get him up to twice a day today 6/24; the patient comes in with his external compression garment on the right leg/Farrow wrap. As far as we know no open wounds here. He has 2 areas that are smaller one on the left anterior pretibial and one on the left lateral calf which is the site of his initial wounds. I have successfully caught him into his external compression pumps twice a day 7/1; still using his external compression garment on the right leg. As far as we know there are no open wounds here. The area on the left lateral calf which was the site of his initial wound has closed over. The area on the left anterior pretibial that we identified last week is still open. 7/8; as far as we know he has no open wounds on the right leg. He has his external compression garment on here. The original wound on the left lateral calf has closed over. He developed an area on the left anterior pretibial 2 weeks ago and seems to have 2 smaller areas on the proximal and distal lateral calf. We do not have his good edema control this week as I am used to seeing 7/15; the patient has still a linear area on the left anterior tibia. The area on the left lateral leg looked somewhat inflamed today but no cellulitis. He did have what looked to be a small  pustule that I cultured for CandS although I did not  give him empiric antibiotics. 7/22; small area on the left anterior tibia. The area on the left lateral leg still looks inflamed. The pustule that I cultured last week grew Enterobacter and Pseudomonas. I prescribed Cipro they still have not picked that up. In any case the area actually looks fairly satisfactory. He has decent edema control using his compression pumps twice a day 7/29; the area on the left lateral leg looks a lot better than last week. Nothing is open here. He has an area just medial to the mid anterior tibia that is still open that is only open wound today. 8/5-Patient returns at 1 week for the left leg ulcer, which is actually looking better, he is also complaining of left plantar foot Steven Meza, Steven A. ([id number]) pain below the great toe. He does have a hematoma here 8/12-Patient returns at 1 week, the left leg open area looks about the same if not slightly better, the left plantar hematoma under the great toe is about the same, patient apparently has not been resting his foot on anything for too long period of time and does not wear the [employer] shoes unless he walks outdoors which he does not do often 8/19; small wound on the left anterior lower leg this is just about closed there is nothing on the left lateral leg. He is using his compression pumps He arrives in clinic today with a black eschar over the right first plantar metatarsal head. This was apparently a bruise that was identified by her nurses last week. He wears crocs on his feet. He also may rub these on a footboard at home Objective Constitutional Sitting or standing Blood Pressure is within target range for patient.. Pulse regular and within target range for patient.Marland Kitchen Respirations regular, non-labored and within target range.. Temperature is normal and within the target range for the patient.Marland Kitchen appears in no distress. Vitals Time Taken: 4:02 PM, Height:  71 in, Weight: 252 lbs, BMI: 35.1, Temperature: 99.3 F, Pulse: 93 bpm, Respiratory Rate: 20 breaths/min, Blood Pressure: 140/62 mmHg. Eyes Conjunctivae clear. No discharge. Ears, Nose, Mouth, and Throat Patient is hard of hearing.Marland Kitchen Respiratory Respiratory effort is easy and symmetric bilaterally. Rate is normal at rest and on room air.. Cardiovascular Pedal pulses are palpable. Psychiatric No evidence of depression, anxiety, or agitation. Calm, cooperative, and communicative. Appropriate interactions and affect.. General Notes: Wound exam; the left leg anteriorly in the pre-tibia area is small I do not think this is completely closed but it is close to that there is nothing else open that I can identify. There is nothing on the right leg either we did not remove his juxta light On the fifth metatarsal head he had a very painful callus that had hemorrhagic material underneath it. Using pickups and a #15 scalpel I remove this he has 2 small open areas underneath it. This is not as bad as I first feared Integumentary (Hair, Skin) Severe bilateral lymphedema with cobblestone skin on the entire left lower leg this is not new. There is no evidence of cellulitis. Wound #3 status is Open. Original cause of wound was Blister. The wound is located on the Left,Medial Lower Leg. The wound measures 0.7cm length x 0.4cm width x 0.1cm depth; 0.22cm^2 area and 0.022cm^3 volume. There is Fat Layer (Subcutaneous Tissue) Exposed exposed. There is no tunneling or undermining noted. There is a medium amount of serous drainage noted. The wound margin is flat and intact. There is no granulation within the wound bed. There is a  large (67-100%) amount of necrotic tissue within the wound bed including Adherent Slough. Steven Meza, Steven Meza ([id number]) Wound #5 status is Open. Original cause of wound was Pressure Injury. The wound is located on the Left,Dorsal Foot. The wound measures 0.6cm length x 0.4cm width x 0.1cm  depth; 0.188cm^2 area and 0.019cm^3 volume. There is Fat Layer (Subcutaneous Tissue) Exposed exposed. There is no tunneling or undermining noted. There is a small amount of serous drainage noted. The wound margin is flat and intact. There is small (1-33%) red granulation within the wound bed. There is a large (67-100%) amount of necrotic tissue within the wound bed including Eschar. 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 There was a Selective/Open Wound Non-Viable Tissue Debridement with a total area of 0.24 sq cm performed by Ricard Dillon, MD. With the following instrument(s): Blade, and Forceps to remove Non-Viable tissue/material. Material removed includes Eschar after achieving pain control using Lidocaine. No specimens were taken.There was no bleeding. The procedure was tolerated well. Post Debridement Measurements: 0.6cm length x 0.4cm width x 0.1cm depth; 0.019cm^3 volume. Post debridement Stage noted as Category/Stage II. Character of Wound/Ulcer Post Debridement requires further debridement. Post procedure Diagnosis Wound #5: Same as Pre-Procedure Wound #3 Pre-procedure diagnosis of Wound #3 is a Venous Leg Ulcer located on the Left,Medial Lower Leg . There was a Three Layer Compression Therapy Procedure by Cornell Barman, RN. Post procedure Diagnosis Wound #3: Same as Pre-Procedure Plan Wound Cleansing: Wound #3 Left,Medial Lower Leg: Dial antibacterial soap, wash wounds, rinse and pat dry prior to dressing wounds Wound #5 Left,Dorsal Foot: Dial antibacterial soap, wash wounds, rinse and pat  dry prior to dressing wounds Anesthetic (add to Medication List): Wound #3 Left,Medial Lower Leg: Topical Lidocaine 4% cream applied to wound bed prior to debridement (In Clinic Only). Wound #5 Left,Dorsal Foot: JOSS, MCDILL ([id number]) Topical Lidocaine 4% cream applied to wound bed prior to debridement (In Clinic Only). Skin Barriers/Peri-Wound Care: Antifungal powder-Nystatin - on toes to help with wetness, Primary Wound Dressing: Wound #3 Left,Medial Lower Leg: Silver Alginate Wound #5 Left,Dorsal Foot: Silver Alginate Secondary Dressing: Wound #3 Left,Medial Lower Leg: ABD pad Wound #5 Left,Dorsal Foot: Foam Dressing Change Frequency: Wound #3 Left,Medial Lower Leg: Change dressing every week - In office Wound #5 Left,Dorsal Foot: Change dressing every week - In office Follow-up Appointments: Wound #3 Left,Medial Lower Leg: Return Appointment in 1 week. Wound #5 Left,Dorsal Foot: Return Appointment in 1 week. Edema Control: Wound #3 Left,Medial Lower Leg: 3 Layer Compression System - Left Lower Extremity Patient to wear own Velcro compression garment. - Right Compression Pump: Use compression pump on left lower extremity for 60 minutes, twice daily. Compression Pump: Use compression pump on right lower extremity for 60 minutes, twice daily. Wound #5 Left,Dorsal Foot: 3 Layer Compression System - Left Lower Extremity Patient to wear own Velcro compression garment. - Right Compression Pump: Use compression pump on left lower extremity for 60 minutes, twice daily. Compression Pump: Use compression pump on right lower extremity for 60 minutes, twice daily. Off-Loading: Wound #3 Left,Medial Lower Leg: Other: - Do not sleep in recliner, sleep in bed with legs elevated. Do not allow pressure on the  bottom of the foot. 1. We are continuing with the same dressing to the small area on the left anterior pre-tibia 2. Silver alginate on the plantar foot. I do not see a good  way to offload this. His gait is horribly ataxic 3. If this came from friction on a footboard I have asked him to remove this as much as I can communicate with him because of hearing issues Electronic Signature(s) Signed: 12/20/2018 4:44:50 PM By: Linton Ham MD Entered By: Linton Ham on 12/20/2018 16:44:49 Steven Meza ([id number]) -------------------------------------------------------------------------------- SuperBill Details Patient Name: Steven Meza Date of Service: 12/20/2018 Medical Record Number: [id number] Patient Account Number: [id number] Date of Birth/Sex: 05/22/1930 (85 y.o. M) Treating RN: Primary Care Provider: Fulton Reek Other Clinician: Referring Provider: Fulton Reek Treating Provider/Extender: Tito Dine in Treatment: 31 Diagnosis Coding ICD-10 Codes Meza Description [phone] Non-pressure chronic ulcer of left calf with other specified severity I89.0 Lymphedema, not elsewhere classified I70.212 Atherosclerosis of native arteries of extremities with intermittent claudication, left leg L97.518 Non-pressure chronic ulcer of other part of right foot with other specified severity Facility Procedures CPT4 Meza Description: [id number] - DEBRIDE WOUND 1ST 20 SQ CM OR < ICD-10 Diagnosis Description L97.518 Non-pressure chronic ulcer of other part of right foot with other Modifier: specified seve Quantity: 1 rity Physician Procedures CPT4 Meza Description: [id number] - WC PHYS DEBR WO ANESTH 20 SQ CM ICD-10 Diagnosis Description L97.518 Non-pressure chronic ulcer of other part of right foot with other Modifier: specified seve Quantity: 1 rity Electronic Signature(s) Signed: 12/20/2018 5:33:48 PM By: Linton Ham MD Entered By: Linton Ham on 12/20/2018 16:45:05

## 2018-12-27 ENCOUNTER — Encounter: Payer: Self-pay | Admitting: Podiatry

## 2018-12-27 ENCOUNTER — Encounter: Payer: Medicare Other | Admitting: Internal Medicine

## 2018-12-27 ENCOUNTER — Other Ambulatory Visit: Payer: Self-pay

## 2018-12-27 ENCOUNTER — Ambulatory Visit (INDEPENDENT_AMBULATORY_CARE_PROVIDER_SITE_OTHER): Payer: Medicare Other | Admitting: Podiatry

## 2018-12-27 DIAGNOSIS — L03119 Cellulitis of unspecified part of limb: Secondary | ICD-10-CM

## 2018-12-27 DIAGNOSIS — L97521 Non-pressure chronic ulcer of other part of left foot limited to breakdown of skin: Secondary | ICD-10-CM | POA: Diagnosis not present

## 2018-12-27 DIAGNOSIS — L97822 Non-pressure chronic ulcer of other part of left lower leg with fat layer exposed: Secondary | ICD-10-CM | POA: Diagnosis not present

## 2018-12-27 MED ORDER — DOXYCYCLINE HYCLATE 100 MG PO TABS: 100.0000 mg | ORAL_TABLET | Freq: Two times a day (BID) | ORAL | 0 refills | Status: DC

## 2018-12-27 MED ORDER — MUPIROCIN 2 % EX OINT: TOPICAL_OINTMENT | CUTANEOUS | 1 refills | Status: DC

## 2018-12-27 NOTE — Progress Notes (Signed)
He presents today chief concern of a lesion sub-first metatarsal phalangeal joint of the left foot states that it is exquisitely sore and has been around for about 3 weeks.  Was noticed by the wound care specialist who are treating his legs for lymphedema.  He denies fever chills nausea vomiting muscle aches pain states that foot is excruciating to walk on.  States that it seems to be getting worse daily.  Objective: Vital signs are stable alert and oriented x3 pulses are minimally palpable.  Surrounding erythema and tenderness with bogginess on palpation sub-first metatarsal phalangeal joint of the left foot.  There was a subcutaneous eschar present which was debrided to bleeding today but exquisitely painful.  No purulence was noted but highly suspicious of cellulitis.  Assessment: Sub-first metatarsal phalangeal joint ulceration with cellulitis left.  Plan: Debrided the area today.  Placed yolk pads.  Ordered Bactroban ointment to be applied daily with a light dressing.  Started him on doxycycline and I will follow-up with him in about 2 weeks.  We did discuss the possible need for custom shoes.

## 2018-12-27 NOTE — Progress Notes (Signed)
Steven Meza, Steven Meza ([id number]) Visit Report for 12/27/2018 Debridement Details Patient Name: Steven Meza, Steven Meza Date of Service: 12/27/2018 1:30 PM Medical Record Number: [id number] Patient Account Number: [id number] Date of Birth/Sex: [birth year] (85 y.o. M) Treating RN: Steven Meza Primary Care Provider: Fulton Meza Other Clinician: Referring Provider: Fulton Meza Treating Provider/Extender: Steven Meza in Treatment: 32 Debridement Performed for Wound #3 Left,Medial Lower Leg Assessment: Performed By: Physician Steven Dillon, MD Debridement Type: Debridement Severity of Tissue Pre Fat layer exposed Debridement: Level of Consciousness (Pre- Awake and Alert procedure): Pre-procedure Verification/Time Yes - 14:18 Out Taken: Start Time: 14:18 Pain Control: Lidocaine Total Area Debrided (L x W): 0.5 (cm) x 0.5 (cm) = 0.25 (cm) Tissue and other material Non-Viable, Callus debrided: Level: Non-Viable Tissue Debridement Description: Selective/Open Wound Instrument: Curette Bleeding: None End Time: 14:19 Procedural Pain: 0 Post Procedural Pain: 0 Response to Treatment: Procedure was tolerated well Level of Consciousness Awake and Alert (Post-procedure): Post Debridement Measurements of Total Wound Length: (cm) 0.5 Width: (cm) 0.5 Depth: (cm) 0.1 Volume: (cm) 0.02 Character of Wound/Ulcer Post Debridement: Improved Severity of Tissue Post Debridement: Fat layer exposed Post Procedure Diagnosis Same as Pre-procedure Electronic Signature(s) Signed: 12/27/2018 4:23:12 PM By: Steven Meza Signed: 12/27/2018 4:56:01 PM By: Steven Ham MD Entered By: Steven Meza on 12/27/2018 14:48:41 Steven Meza ([id number]) Steven Meza ([id number]) -------------------------------------------------------------------------------- HPI Details Patient Name: Steven Meza Date of Service: 12/27/2018 1:30 PM Medical Record Number:  [id number] Patient Account Number: [id number] Date of Birth/Sex: 04-Sep-1930 (85 y.o. M) Treating RN: Steven Meza Primary Care Provider: Fulton Meza Other Clinician: Referring Provider: Fulton Meza Treating Provider/Extender: Steven Meza in Treatment: 64 History of Present Illness HPI Description: ADMISSION 05/17/2018 Steven Meza is an 85 year old man who is been treated at the lymphedema clinic for a long period with lymphedema wraps for bilateral lower extremity lymphedema. About 6 months ago they noted small open areas on his left lateral calf just above the ankle. These were open. They are apparently putting some form of ointment on this. They have been referred here for our review of this. The patient has a long history of lymphedema and venous stasis. It says in his records that he has recurrent cellulitis although his wife denies this but she states that the lymphedema may have started with cellulitis several years ago. Also in his record there is a history of bladder cancer which they seem to know very little about however he did not have any radiation to the pelvis or anything that could have contributed to lymphedema that they are aware of. There is no prior wound history. The patient has 2 small punched out areas on the left lateral calf that have adherent debris on the surface. Past medical history; lymphedema, hypertension, cellulitis, hearing loss, morbid obesity, osteoarthritis, venous stasis, bladder CA, diverticulosis. ABIs in our clinic were 0.36 on the right and 0.57 on the left 05/24/2018; corrected age on this patient is 85 versus what I said last week. He has been for his arterial studies which predictably are not very good. On the right his ABI is 0.65. Monophasic waveforms noted at the right ankle including the posterior tibial and dorsalis pedis. He has triphasic waveforms of the common femoral and profunda femoris triphasic proximal SFA with  monophasic distal FSA and popliteal artery. Monophasic tibial waveforms. On the left his ABI is 0.58 monophasic waveforms at the left ankle. Duplex of the left lower extremity demonstrated atherosclerotic change with monophasic waveforms throughout  the left lower extremity. There was some concern about left iliac disease and potentially femoral-popliteal and tibial disease. The patient does not describe claudication although according to his wife he over emphasizes his activity. Patient states he is limited by pain in both knees His wounds are on the left lateral calf. 2 small areas with necrotic debris. We have been using silver collagen and light Ace wraps 05/31/2018; wounds on the left lateral calf in the setting of very significant lymphedema and probably PAD. We have been using silver collagen. The base of the small wound looks reasonably improved. I sent him to see Steven Meza however I see now he has an appointment with Steven Meza on February 12 2/5; left lateral calf wound looks the same. His wife is doing a good job with her lymphedema wraps and maintaining that the edema. He most likely has PAD and is due to see Steven Meza of infectious disease on February 12 2/19; left lateral calf wounds look about the same. This looks like wound secondary to chronic venous insufficiency with lymphedema however the patient has very poor arterial studies. We referred him to Steven Meza. Steven Meza did not feel he needed to do anything from an arterial point of view. He did not address the question about the aggressiveness of compression. After some thoughts about this I elected to go ahead and put him in 3 layer compression which after all would be less compression then the lymphedema clinic was putting on this. I am hopeful that this should be enough to get some closure of the small wounds 2/26; left lateral calf wound letter this week. He tolerated the 3 layer compression well furthermore he is sleeping in a  hospital bed which helps keep his legs up at night. The wound looks better measuring smaller especially in width 3/3; left lateral calf wound about the same size. The 3 layer compression has really helped get the edema down in his leg however. Using silver collagen 3/11; left lateral calf wound better looking wound surface but about the same size. We have been using 3 layer compression which is really helped get the edema down in the left leg. Even after Dr. Kennon Holter assessment I am reluctant to go to 4 layer compression. He is tolerating 3 layer compression well. We have been using silver collagen HARJAS, BIGGINS ([id number]) 3/18-Patient returns for his left lateral calf wound which overall is looking better, slightly increased in size and dimensions and area, he is tolerating the 3 layer compression, has been dressed with silver collagen which we will continue 3/25oleft lateral calf wound much better looking. Size is smaller. He has been using silver collagen 4/1; gradually getting smaller in size. Using silver collagen with 3 layer compression. I think the patient has some degree of PAD. He saw Steven Meza in consultation, he did not specifically comment on whether he could tolerate 4 layer compression 4/8; the wound is slightly smaller in depth. We are using 3 layer compression with silver collagen. I had him seen for arterial insufficiency however his ABI was 0.57 in the left leg. Steven Meza really did not seem to middle on the degree of compression he could tolerate 4/15; the wound is slightly smaller and slightly less deep. We have been using 3 layer compression with silver collagen. There is not an option for 4-layer compression here. 4/22; the patient is making decent progress on his wound on the left lateral calf however he arrives in with a superficial wound  on the right anterior tibial area. Were not really sure how this happened. He had been using a stocking on the right leg. He has  been using silver collagen on the wounds Amazing to me he is he appears to have compression pumps at home. I am not really sure I knew this before. In any case he has not been using them 4/29; left lateral calf wound continues to get gradually smaller. This looks like it is on its way to closure. oThe area on the right anterior tibial area is about the same in terms of size superficial without any depth. This was probably wrap injury oHe tells me that he has been using his compression pumps once a day for 1 hour without any pain 5/6; not as good today. Left lateral calf wound is actually larger and he does not have quite as good edema control. The area on the right anterior tibia is about the same still superficial. He states he is using his compression pumps once a day. We are using 3 layer compression on the left and Curlex Coban on the right 5/13; the area on the right is very close to being healed. The area on the left still has some depth but has a healthy wound surface. He has excellent edema control bilaterally. Using Hydrofera Blue 5/20-Patient returns in 1 week after being in 3 layer compression on the left, the left lateral calf ulcer is stable with some slough, the right anterior tibial ulcer is healed we have been using Kerlix Coban on that leg 5/27; Hydrofera Blue and 3 layer compression. Still some slough in the wound area and some nonviable edges around the wound. 6/3; this patient has lymphedema and chronic venous insufficiency. He had difficult to close wound on his left lateral calf. This is closed today. 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 Finally he has known PAD but he is managed to tolerate the compression we reporting on him and also using his compression pumps that he already had at home twice a day 6/10; we discharge this patient last week having finally close the small area in the left lateral calf. Apparently by Friday of  last week this had opened and was draining. He is therefore back in clinic. His wife who is present also was concerned about an area on the right anterior tibia."o 6/17; the patient does not have an open area on the right leg. On the left he has a small circular area anteriorly and the same area laterally that he had last time. However both of these appear to be improved he has his Farrow wrap 30/40. It turns out that he is only using his external compression pumps once a day. I tried to get him up to twice a day today 6/24; the patient comes in with his external compression garment on the right leg/Farrow wrap. As far as we know no open wounds here. He has 2 areas that are smaller one on the left anterior pretibial and one on the left lateral calf which is the site of his initial wounds. I have successfully caught him into his external compression pumps twice a day 7/1; still using his external compression garment on the right leg. As far as we know there are no open wounds here. The area on the left lateral calf which was the site of his initial wound has closed over. The area on the left anterior pretibial that we identified last week is  still open. 7/8; as far as we know he has no open wounds on the right leg. He has his external compression garment on here. The original wound on the left lateral calf has closed over. He developed an area on the left anterior pretibial 2 weeks ago and seems to have 2 smaller areas on the proximal and distal lateral calf. We do not have his good edema control this week as I am used to seeing 7/15; the patient has still a linear area on the left anterior tibia. The area on the left lateral leg looked somewhat inflamed today but no cellulitis. He did have what looked to be a small pustule that I cultured for CandS although I did not give him empiric antibiotics. 7/22; small area on the left anterior tibia. The area on the left lateral leg still looks inflamed.  The pustule that I cultured last week grew Enterobacter and Pseudomonas. I prescribed Cipro they still have not picked that up. In any case the area actually looks fairly satisfactory. He has decent edema control using his compression pumps twice a day 7/29; the area on the left lateral leg looks a lot better than last week. Nothing is open here. He has an area just medial to the mid anterior tibia that is still open that is only open wound today. 8/5-Patient returns at 1 week for the left leg ulcer, which is actually looking better, he is also complaining of left plantar foot pain below the great toe. He does have a hematoma here IVERSON, SEES ([id number]) 8/12-Patient returns at 1 week, the left leg open area looks about the same if not slightly better, the left plantar hematoma under the great toe is about the same, patient apparently has not been resting his foot on anything for too long period of time and does not wear the [employer]ken[employer] shoes unless he walks outdoors which he does not do often 8/19; small wound on the left anterior lower leg this is just about closed there is nothing on the left lateral leg. He is using his compression pumps He arrives in clinic today with a black eschar over the right first plantar metatarsal head. This was apparently a bruise that was identified by her nurses last week. He wears crocs on his feet. He also may rub these on a footboard at home 8/26; there is nothing open on the left lateral lower leg he has a small area just medial to the tibia. He is going to see his podiatrist Dr. Milinda Pointer about the area on the left plantar first metatarsal head. He is using his external compression pumps. He has a juxta lite on the right leg Electronic Signature(s) Signed: 12/27/2018 4:56:01 PM By: Steven Ham MD Entered By: Steven Meza on 12/27/2018 14:50:14 Steven Meza  ([id number]) -------------------------------------------------------------------------------- Physical Exam Details Patient Name: Steven Meza Date of Service: 12/27/2018 1:30 PM Medical Record Number: [id number] Patient Account Number: [id number] Date of Birth/Sex: [birth year] (85 y.o. M) Treating RN: Steven Meza Primary Care Provider: Fulton Meza Other Clinician: Referring Provider: Fulton Meza Treating Provider/Extender: Steven Meza in Treatment: 7 Constitutional Patient is hypertensive.. Pulse regular and within target range for patient.Marland Kitchen Respirations regular, non-labored and within target range.. Temperature is normal and within the target range for the patient.Marland Kitchen appears in no distress. Respiratory Respiratory effort is easy and symmetric bilaterally. Rate is normal at rest and on room air.. Cardiovascular Pedal pulses are palpable. Integumentary (Hair, Skin) Changes of chronic lymphedema in the  bilateral lower extremities. Psychiatric No evidence of depression, anxiety, or agitation. Calm, cooperative, and communicative. Appropriate interactions and affect.. Notes Wound exam; left leg anteriorly just medial to the tibia. Small open area debrided with a #3 curette. There is nothing on the left lateral leg. His edema control in the left leg is really quite good Electronic Signature(s) Signed: 12/27/2018 4:56:01 PM By: Steven Ham MD Entered By: Steven Meza on 12/27/2018 14:51:35 Steven Meza ([id number]) -------------------------------------------------------------------------------- Physician Orders Details Patient Name: Steven Meza Date of Service: 12/27/2018 1:30 PM Medical Record Number: [id number] Patient Account Number: [id number] Date of Birth/Sex: [birth year] (85 y.o. M) Treating RN: Steven Meza Primary Care Provider: Fulton Meza Other Clinician: Referring Provider: Fulton Meza Treating Provider/Extender: Steven Meza in Treatment: 67 Verbal / Phone Orders: No Diagnosis Coding Wound Cleansing Wound #3 Left,Medial Lower Leg o Dial antibacterial soap, wash wounds, rinse and pat dry prior to dressing wounds Wound #5 Left,Plantar Foot o Dial antibacterial soap, wash wounds, rinse and pat dry prior to dressing wounds Anesthetic (add to Medication List) Wound #3 Left,Medial Lower Leg o Topical Lidocaine 4% cream applied to wound bed prior to debridement (In Clinic Only). Wound #5 Left,Plantar Foot o Topical Lidocaine 4% cream applied to wound bed prior to debridement (In Clinic Only). Primary Wound Dressing Wound #3 Left,Medial Lower Leg o Silver Alginate Wound #5 Left,Plantar Foot o Silver Alginate Secondary Dressing Wound #3 Left,Medial Lower Leg o ABD pad Wound #5 Left,Plantar Foot o Foam Dressing Change Frequency Wound #3 Left,Medial Lower Leg o Change dressing every week - In office Wound #5 Left,Plantar Foot o Change dressing every week - In office Follow-up Appointments Wound #3 Left,Medial Lower Leg o Return Appointment in 1 week. Wound #5 Left,Plantar Foot o Return Appointment in 1 week. Edema Control ODIES, DESA ([id number]) Wound #3 Left,Medial Lower Leg o 3 Layer Compression System - Left Lower Extremity o Patient to wear own Velcro compression garment. - Right o Compression Pump: Use compression pump on left lower extremity for 60 minutes, twice daily. o Compression Pump: Use compression pump on right lower extremity for 60 minutes, twice daily. Wound #5 Left,Plantar Foot o 3 Layer Compression System - Left Lower Extremity o Patient to wear own Velcro compression garment. - Right o Compression Pump: Use compression pump on left lower extremity for 60 minutes, twice daily. o Compression Pump: Use compression pump on right lower extremity for 60 minutes, twice daily. Off-Loading Wound #3 Left,Medial Lower Leg o  Other: - Do not sleep in recliner, sleep in bed with legs elevated. Do not allow pressure on the bottom of the foot. Electronic Signature(s) Signed: 12/27/2018 4:23:12 PM By: Steven Meza Signed: 12/27/2018 4:56:01 PM By: Steven Ham MD Entered By: Steven Meza on 12/27/2018 14:24:37 Steven Meza ([id number]) -------------------------------------------------------------------------------- Problem List Details Patient Name: Steven Meza Date of Service: 12/27/2018 1:30 PM Medical Record Number: [id number] Patient Account Number: [id number] Date of Birth/Sex: 1930/08/26 (85 y.o. 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 with 05/17/2018 No Yes intermittent claudication, left leg L97.518 Non-pressure chronic ulcer of other part of right foot with 12/20/2018 No Yes other specified severity Inactive  Problems ICD-10 Meza Description Active Date Inactive Date L97.211 Non-pressure chronic ulcer of right calf limited to breakdown of skin 08/23/2018 08/23/2018 Resolved Problems Electronic Signature(s) Signed: 12/27/2018 4:56:01 PM By: Steven Ham MD Entered By: Steven Meza on 12/27/2018 14:48:06 Steven Meza ([id number]) -------------------------------------------------------------------------------- Progress Note Details Patient Name: Steven Meza Date of Service: 12/27/2018 1:30 PM Medical Record Number: [id number] Patient Account Number: [id number] Date of Birth/Sex: March 04, 1931 (85 y.o. M) Treating RN: Steven Meza Primary Care Provider: Fulton Meza Other Clinician: Referring Provider: Fulton Meza Treating Provider/Extender: Steven Meza in Treatment: 32 Subjective History of Present Illness (HPI) ADMISSION 05/17/2018 Mr. Hinderliter is an 85 year old man who is been treated at the lymphedema clinic for a long period with lymphedema wraps for bilateral lower extremity lymphedema. About 6 months ago they noted small open areas on his left lateral calf just above the ankle. These were open. They are apparently putting some form of ointment on this. They have been referred here for our review of this. The patient has a long history of lymphedema and venous stasis. It says in his records that he has recurrent cellulitis although his wife denies this but she states that the lymphedema may have started with cellulitis several years ago. Also in his record there is a history of bladder cancer which they seem to know very little about however he did not have any radiation to the pelvis or anything that could have contributed to lymphedema that they are aware of. There is no prior wound history. The patient has 2 small punched out areas on the left lateral calf that have adherent debris on the surface. Past medical history; lymphedema, hypertension, cellulitis, hearing loss, morbid obesity, osteoarthritis, venous stasis, bladder CA, diverticulosis. ABIs in our clinic were 0.36 on the right and 0.57 on the left 05/24/2018; corrected age on this patient is 35 versus what I said last week. He has been for his arterial studies which predictably are not very good. On the right his ABI is 0.65. Monophasic waveforms noted at the right ankle including the posterior tibial and dorsalis pedis. He has triphasic waveforms of the common femoral and profunda femoris triphasic proximal SFA with monophasic distal FSA and popliteal artery. Monophasic tibial waveforms. On the left his ABI is 0.58 monophasic  waveforms at the left ankle. Duplex of the left lower extremity demonstrated atherosclerotic change with monophasic waveforms throughout the left lower extremity. There was some concern about left iliac disease and potentially femoral-popliteal and tibial disease. The patient does not describe claudication although according to his wife he over emphasizes his activity. Patient states he is limited by pain in both knees His wounds are on the left lateral calf. 2 small areas with necrotic debris. We have been using silver collagen and light Ace wraps 05/31/2018; wounds on the left lateral calf in the setting of very significant lymphedema and probably PAD. We have been using silver collagen. The base of the small wound looks reasonably improved. I sent him to see Steven Meza however I see now he has an appointment with Steven Meza on February 12 2/5; left lateral calf wound looks the same. His wife is doing a good job with her lymphedema wraps and maintaining that the edema. He most likely has PAD and is due to see Steven Meza of infectious disease on February 12 2/19; left lateral calf wounds look about the same. This looks like wound secondary to chronic venous insufficiency with lymphedema however the patient has very  poor arterial studies. We referred him to Steven Meza. Steven Meza did not feel he needed to do anything from an arterial point of view. He did not address the question about the aggressiveness of compression. After some thoughts about this I elected to go ahead and put him in 3 layer compression which after all would be less compression then the lymphedema clinic was putting on this. I am hopeful that this should be enough to get some closure of the small wounds 2/26; left lateral calf wound letter this week. He tolerated the 3 layer compression well furthermore he is sleeping in a hospital bed which helps keep his legs up at night. The wound looks better measuring smaller especially in  width 3/3; left lateral calf wound about the same size. The 3 layer compression has really helped get the edema down in his leg however. Using silver collagen 3/11; left lateral calf wound better looking wound surface but about the same size. We have been using 3 layer compression which is really helped get the edema down in the left leg. Even after Dr. Kennon Holter assessment I am reluctant to go to 4 layer LELAN, CUSH A. ([id number]) compression. He is tolerating 3 layer compression well. We have been using silver collagen 3/18-Patient returns for his left lateral calf wound which overall is looking better, slightly increased in size and dimensions and area, he is tolerating the 3 layer compression, has been dressed with silver collagen which we will continue 3/25 left lateral calf wound much better looking. Size is smaller. He has been using silver collagen 4/1; gradually getting smaller in size. Using silver collagen with 3 layer compression. I think the patient has some degree of PAD. He saw Steven Meza in consultation, he did not specifically comment on whether he could tolerate 4 layer compression 4/8; the wound is slightly smaller in depth. We are using 3 layer compression with silver collagen. I had him seen for arterial insufficiency however his ABI was 0.57 in the left leg. Steven Meza really did not seem to middle on the degree of compression he could tolerate 4/15; the wound is slightly smaller and slightly less deep. We have been using 3 layer compression with silver collagen. There is not an option for 4-layer compression here. 4/22; the patient is making decent progress on his wound on the left lateral calf however he arrives in with a superficial wound on the right anterior tibial area. Were not really sure how this happened. He had been using a stocking on the right leg. He has been using silver collagen on the wounds Amazing to me he is he appears to have compression pumps at home. I  am not really sure I knew this before. In any case he has not been using them 4/29; left lateral calf wound continues to get gradually smaller. This looks like it is on its way to closure. The area on the right anterior tibial area is about the same in terms of size superficial without any depth. This was probably wrap injury He tells me that he has been using his compression pumps once a day for 1 hour without any pain 5/6; not as good today. Left lateral calf wound is actually larger and he does not have quite as good edema control. The area on the right anterior tibia is about the same still superficial. He states he is using his compression pumps once a day. We are using 3 layer compression on the left and Curlex Coban  on the right 5/13; the area on the right is very close to being healed. The area on the left still has some depth but has a healthy wound surface. He has excellent edema control bilaterally. Using Hydrofera Blue 5/20-Patient returns in 1 week after being in 3 layer compression on the left, the left lateral calf ulcer is stable with some slough, the right anterior tibial ulcer is healed we have been using Kerlix Coban on that leg 5/27; Hydrofera Blue and 3 layer compression. Still some slough in the wound area and some nonviable edges around the wound. 6/3; this patient has lymphedema and chronic venous insufficiency. He had difficult to close wound on his left lateral calf. This is closed today. 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 Finally he has known PAD but he is managed to tolerate the compression we reporting on him and also using his compression pumps that he already had at home twice a day 6/10; we discharge this patient last week having finally close the small area in the left lateral calf. Apparently by Friday of last week this had opened and was draining. He is therefore back in clinic. His wife who is present also was  concerned about an area on the right anterior tibia."o 6/17; the patient does not have an open area on the right leg. On the left he has a small circular area anteriorly and the same area laterally that he had last time. However both of these appear to be improved he has his [place] wrap 30/40. It turns out that he is only using his external compression pumps once a day. I tried to get him up to twice a day today 6/24; the patient comes in with his external compression garment on the right leg/[place] wrap. As far as we know no open wounds here. He has 2 areas that are smaller one on the left anterior pretibial and one on the left lateral calf which is the site of his initial wounds. I have successfully caught him into his external compression pumps twice a day 7/1; still using his external compression garment on the right leg. As far as we know there are no open wounds here. The area on the left lateral calf which was the site of his initial wound has closed over. The area on the left anterior pretibial that we identified last week is still open. 7/8; as far as we know he has no open wounds on the right leg. He has his external compression garment on here. The original wound on the left lateral calf has closed over. He developed an area on the left anterior pretibial 2 weeks ago and seems to have 2 smaller areas on the proximal and distal lateral calf. We do not have his good edema control this week as I am used to seeing 7/15; the patient has still a linear area on the left anterior tibia. The area on the left lateral leg looked somewhat inflamed today but no cellulitis. He did have what looked to be a small pustule that I cultured for CandS although I did not give him empiric antibiotics. 7/22; small area on the left anterior tibia. The area on the left lateral leg still looks inflamed. The pustule that I cultured last week grew Enterobacter and Pseudomonas. I prescribed Cipro they still have not  picked that up. In any case the area actually looks fairly satisfactory. He has decent edema control using his compression pumps twice  a day 7/29; the area on the left lateral leg looks a lot better than last week. Nothing is open here. He has an area just medial to the mid anterior tibia that is still open that is only open wound today. 8/5-Patient returns at 1 week for the left leg ulcer, which is actually looking better, he is also complaining of left plantar foot DARYN, HICKS A. ([id number]) pain below the great toe. He does have a hematoma here 8/12-Patient returns at 1 week, the left leg open area looks about the same if not slightly better, the left plantar hematoma under the great toe is about the same, patient apparently has not been resting his foot on anything for too long period of time and does not wear the [employer] shoes unless he walks outdoors which he does not do often 8/19; small wound on the left anterior lower leg this is just about closed there is nothing on the left lateral leg. He is using his compression pumps He arrives in clinic today with a black eschar over the right first plantar metatarsal head. This was apparently a bruise that was identified by her nurses last week. He wears crocs on his feet. He also may rub these on a footboard at home 8/26; there is nothing open on the left lateral lower leg he has a small area just medial to the tibia. He is going to see his podiatrist Dr. Milinda Pointer about the area on the left plantar first metatarsal head. He is using his external compression pumps. He has a juxta lite on the right leg Objective Constitutional Patient is hypertensive.. Pulse regular and within target range for patient.Marland Kitchen Respirations regular, non-labored and within target range.. Temperature is normal and within the target range for the patient.Marland Kitchen appears in no distress. Vitals Time Taken: 1:42 PM, Height: 71 in, Weight: 252 lbs, BMI: 35.1, Temperature: 99.5  F, Pulse: 75 bpm, Respiratory Rate: 20 breaths/min, Blood Pressure: 168/47 mmHg. Respiratory Respiratory effort is easy and symmetric bilaterally. Rate is normal at rest and on room air.. Cardiovascular Pedal pulses are palpable. Psychiatric No evidence of depression, anxiety, or agitation. Calm, cooperative, and communicative. Appropriate interactions and affect.. General Notes: Wound exam; left leg anteriorly just medial to the tibia. Small open area debrided with a #3 curette. There is nothing on the left lateral leg. His edema control in the left leg is really quite good Integumentary (Hair, Skin) Changes of chronic lymphedema in the bilateral lower extremities. Wound #3 status is Open. Original cause of wound was Blister. The wound is located on the Left,Medial Lower Leg. The wound measures 0.5cm length x 0.5cm width x 0.1cm depth; 0.196cm^2 area and 0.02cm^3 volume. There is Fat Layer (Subcutaneous Tissue) Exposed exposed. There is no tunneling or undermining noted. There is a medium amount of serous drainage noted. The wound margin is flat and intact. There is small (1-33%) pink granulation within the wound bed. There is a large (67-100%) amount of necrotic tissue within the wound bed including Adherent Slough. Wound #5 status is Open. Original cause of wound was Pressure Injury. The wound is located on the [place]. The wound measures 0.1cm length x 0.1cm width x 0.1cm depth; 0.008cm^2 area and 0.001cm^3 volume. There is Fat Layer (Subcutaneous Tissue) Exposed exposed. There is no tunneling or undermining noted. There is a small amount of serous drainage noted. The wound margin is flat and intact. There is no granulation within the wound bed. There is a large (67-100%) amount of necrotic  tissue within the wound bed including Eschar. 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 There was a Selective/Open Wound Non-Viable Tissue Debridement with a total area of 0.25 sq cm performed by Steven Dillon, MD. With the following instrument(s): Curette to remove Non-Viable tissue/material. Material removed includes Callus after achieving pain control using Lidocaine. No specimens were taken. A time out was conducted at 14:18, prior to the start of the procedure. There was no bleeding. The procedure was tolerated well with a pain level of 0 throughout and a pain level of 0 following the procedure. Post Debridement Measurements: 0.5cm length x 0.5cm width x 0.1cm depth; 0.02cm^3 volume. Character of Wound/Ulcer Post Debridement is improved. Severity of Tissue Post Debridement is: Fat layer exposed. Post procedure Diagnosis Wound #3: Same as Pre-Procedure Plan Wound Cleansing: Wound #3 Left,Medial Lower Leg: Dial antibacterial soap, wash wounds, rinse and pat dry prior to dressing wounds Wound #5 Left,Plantar Foot: Dial antibacterial soap, wash wounds, rinse and pat dry prior to dressing wounds Anesthetic (add to Medication List): Wound #3 Left,Medial Lower Leg: Topical Lidocaine 4% cream applied to wound bed prior to debridement (In Clinic Only). Wound #5 Left,Plantar Foot: Topical Lidocaine 4% cream applied to wound bed prior to debridement (In Clinic Only). Primary Wound Dressing: Wound #3 Left,Medial Lower Leg: Silver Alginate Wound #5 Left,Plantar Foot: Silver Alginate Secondary Dressing: Wound #3 Left,Medial Lower Leg: ABD pad Wound #5 Left,Plantar Foot: Foam Dressing  Change Frequency: ANTOINE, VANDERMEULEN ([id number]) Wound #3 Left,Medial Lower Leg: Change dressing every week - In office Wound #5 Left,Plantar Foot: Change dressing every week - In office Follow-up Appointments: Wound #3 Left,Medial Lower Leg: Return Appointment in 1 week. Wound #5 Left,Plantar Foot: Return Appointment in 1 week. Edema Control: Wound #3 Left,Medial Lower Leg: 3 Layer Compression System - Left Lower Extremity Patient to wear own Velcro compression garment. - Right Compression Pump: Use compression pump on left lower extremity for 60 minutes, twice daily. Compression Pump: Use compression pump on right lower extremity for 60 minutes, twice daily. Wound #5 Left,Plantar Foot: 3 Layer Compression System - Left Lower Extremity Patient to wear own Velcro compression garment. - Right Compression Pump: Use compression pump on left lower extremity for 60 minutes, twice daily. Compression Pump: Use compression pump on right lower extremity for 60 minutes, twice daily. Off-Loading: Wound #3 Left,Medial Lower Leg: Other: - Do not sleep in recliner, sleep in bed with legs elevated. Do not allow pressure on the bottom of the foot. 1. Continue with silver alginate to the left medial lower leg. 2. He is seeing his podiatrist with regards to the painful area on the left first plantar met head which looks like hemorrhagic callus. I remove this last week there was an open wound in this area. He is going to need something to offload this better than the crocs he is using 3. He remains in 3 layer compression and using his compression pumps Electronic Signature(s) Signed: 12/27/2018 4:56:01 PM By: Steven Ham MD Entered By: Steven Meza on 12/27/2018 14:53:34 Steven Meza ([id number]) -------------------------------------------------------------------------------- SuperBill Details Patient Name: Steven Meza.  Date of Service: 12/27/2018 Medical Record Number:  [id number] Patient Account Number: [id number] Date of Birth/Sex: 04-23-1931 (85 y.o. M) Treating RN: Steven Meza Primary Care Provider: Fulton Meza Other Clinician: Referring Provider: Fulton Meza Treating Provider/Extender: Steven Meza in Treatment: 32 Diagnosis Coding ICD-10 Codes Meza Description [phone] Non-pressure chronic ulcer of left calf with other specified severity I89.0 Lymphedema, not elsewhere classified I70.212 Atherosclerosis of native arteries of extremities with intermittent claudication, left leg L97.518 Non-pressure chronic ulcer of other part of right foot with other specified severity Facility Procedures CPT4 Meza: [id number] Description: 00511 - DEBRIDE WOUND 1ST 20 SQ CM OR < ICD-10 Diagnosis Description L97.228 Non-pressure chronic ulcer of left calf with other specified sev Modifier: erity Quantity: 1 Physician Procedures CPT4 Meza: [id number] Description: [place] - WC PHYS DEBR WO ANESTH 20 SQ CM ICD-10 Diagnosis Description L97.228 Non-pressure chronic ulcer of left calf with other specified seve Modifier: rity Quantity: 1 Electronic Signature(s) Signed: 12/27/2018 4:56:01 PM By: Steven Ham MD Entered By: Steven Meza on 12/27/2018 14:53:54

## 2018-12-27 NOTE — Progress Notes (Signed)
BAER, SNAY ([id number]) Visit Report for 12/27/2018 Arrival Information Details Patient Name: Steven Meza, Steven Meza Date of Service: 12/27/2018 1:30 PM Medical Record Number: [id number] Patient Account Number: [id number] Date of Birth/Sex: Jun 01, 1930 (85 y.o. M) Treating RN: Harold Barban Primary Care Kaelah Hayashi: Fulton Reek Other Clinician: Referring Jakson Delpilar: Fulton Reek Treating Adryen Cookson/Extender: Tito Dine in Treatment: 85 Visit Information History Since Last Visit Added or deleted any medications: No Patient Arrived: Cane Any new allergies or adverse reactions: No Arrival Time: 13:40 Had a fall or experienced change in No Accompanied By: wife activities of daily living that may affect Transfer Assistance: None risk of falls: Patient Identification Verified: Yes Signs or symptoms of abuse/neglect since last visito No Secondary Verification Process Completed: Yes Hospitalized since last visit: No Implantable device outside of the clinic excluding No cellular tissue based products placed in the center since last visit: Has Dressing in Place as Prescribed: Yes Has Compression in Place as Prescribed: Yes Pain Present Now: Yes Electronic Signature(s) Signed: 12/27/2018 2:04:11 PM By: Lorine Bears RCP, RRT, CHT Entered By: Lorine Bears on 12/27/2018 13:41:13 Steven Meza ([id number]) -------------------------------------------------------------------------------- Lower Extremity Assessment Details Patient Name: Steven Meza Date of Service: 12/27/2018 1:30 PM Medical Record Number: [id number] Patient Account Number: [id number] Date of Birth/Sex: November 29, 1930 (85 y.o. M) Treating RN: Army Melia Primary Care Lenny Bouchillon: Fulton Reek Other Clinician: Referring Willella Harding: Fulton Reek Treating Jeraldean Wechter/Extender: Tito Dine in Treatment: 32 Edema Assessment Assessed: [Left: No] [Right: No] Edema: [Left:  N] [Right: o] Calf Left: Right: Point of Measurement: 32 cm From Medial Instep 42 cm cm Ankle Left: Right: Point of Measurement: 11 cm From Medial Instep 28 cm cm Vascular Assessment Pulses: Dorsalis Pedis Palpable: [Left:Yes] Electronic Signature(s) Signed: 12/27/2018 4:18:00 PM By: Army Melia Entered By: Army Melia on 12/27/2018 13:58:18 Steven Meza ([id number]) -------------------------------------------------------------------------------- Multi Wound Chart Details Patient Name: Steven Meza Date of Service: 12/27/2018 1:30 PM Medical Record Number: [id number] Patient Account Number: [id number] Date of Birth/Sex: Feb 15, 1931 (85 y.o. M) Treating RN: Harold Barban Primary Care Anapaula Severt: Fulton Reek Other Clinician: Referring Tarnisha Kachmar: Fulton Reek Treating Abdifatah Colquhoun/Extender: Tito Dine in Treatment: 32 Vital Signs Height(in): 71 Pulse(bpm): 75 Weight(lbs): 252 Blood Pressure(mmHg): 168/47 Body Mass Index(BMI): 35 Temperature(F): 99.5 Respiratory Rate 20 (breaths/min): Photos: [N/A:N/A] Wound Location: Left Lower Leg - Medial Left Foot - Plantar N/A Wounding Event: Blister Pressure Injury N/A Primary Etiology: Venous Leg Ulcer Pressure Ulcer N/A Comorbid History: Lymphedema, Coronary Artery Lymphedema, Coronary Artery N/A Disease, Hypertension Disease, Hypertension Date Acquired: 10/10/2018 12/08/2018 N/A Weeks of Treatment: 11 1 N/A Wound Status: Open Open N/A Measurements L x W x D 0.5x0.5x0.1 0.1x0.1x0.1 N/A (cm) Area (cm) : 0.196 0.008 N/A Volume (cm) : 0.02 0.001 N/A % Reduction in Area: 80.80% 95.70% N/A % Reduction in Volume: 80.40% 94.70% N/A Classification: Partial Thickness Category/Stage II N/A Exudate Amount: Medium Small N/A Exudate Type: Serous Serous N/A Exudate Color: amber amber N/A Wound Margin: Flat and Intact Flat and Intact N/A Granulation Amount: Small (1-33%) None Present (0%) N/A Granulation Quality: Pink  N/A N/A Necrotic Amount: Large (67-100%) Large (67-100%) N/A Necrotic Tissue: Adherent Slough Eschar N/A Exposed Structures: Fat Layer (Subcutaneous Fat Layer (Subcutaneous N/A Tissue) Exposed: Yes Tissue) Exposed: Yes Fascia: No Tendon: No Muscle: No Steven Meza, Steven Meza ([id number]) Joint: No Bone: No Epithelialization: None Small (1-33%) N/A Debridement: Debridement - Selective/Open N/A N/A Wound Pre-procedure 14:18 N/A N/A Verification/Time Out Taken: Pain Control: Lidocaine N/A N/A Tissue Debrided: Callus  N/A N/A Level: Non-Viable Tissue N/A N/A Debridement Area (sq cm): 0.25 N/A N/A Instrument: Curette N/A N/A Bleeding: None N/A N/A Procedural Pain: 0 N/A N/A Post Procedural Pain: 0 N/A N/A Debridement Treatment Procedure was tolerated well N/A N/A Response: Post Debridement 0.5x0.5x0.1 N/A N/A Measurements L x W x D (cm) Post Debridement Volume: 0.02 N/A N/A (cm) Procedures Performed: Debridement N/A N/A Treatment Notes Electronic Signature(s) Signed: 12/27/2018 4:56:01 PM By: Linton Ham MD Entered By: Linton Ham on 12/27/2018 14:48:17 Steven Meza ([id number]) -------------------------------------------------------------------------------- Multi-Disciplinary Care Plan Details Patient Name: Steven Meza Date of Service: 12/27/2018 1:30 PM Medical Record Number: [id number] Patient Account Number: [id number] Date of Birth/Sex: 08/22/1930 (85 y.o. M) Treating RN: Harold Barban Primary Care Moiz Ryant: Fulton Reek Other Clinician: Referring Corneshia Hines: Fulton Reek Treating Asti Mackley/Extender: Tito Dine in Treatment: 23 Active Inactive Orientation to the Wound Care Program Nursing Diagnoses: Knowledge deficit related to the wound healing center program Goals: Patient/caregiver will verbalize understanding of the [place] Date Initiated: 05/17/2018 Target Resolution Date: 06/17/2018 Goal Status:  Active Interventions: Provide education on orientation to the wound center Notes: Venous Leg Ulcer Nursing Diagnoses: Potential for venous Insuffiency (use before diagnosis confirmed) Goals: Patient/caregiver will verbalize understanding of disease process and disease management Date Initiated: 05/17/2018 Target Resolution Date: 06/17/2018 Goal Status: Active Interventions: Assess peripheral edema status every visit. Treatment Activities: Non-invasive vascular studies : 05/17/2018 Notes: Wound/Skin Impairment Nursing Diagnoses: Impaired tissue integrity Goals: Patient/caregiver will verbalize understanding of skin care regimen Date Initiated: 05/17/2018 Target Resolution Date: 05/17/2018 Goal Status: Active Steven Meza, Steven Meza ([id number]) Ulcer/skin breakdown will heal within 14 weeks Date Initiated: 05/17/2018 Target Resolution Date: 08/16/2018 Goal Status: Active Interventions: Assess ulceration(s) every visit Treatment Activities: Referred to DME [facility] for dressing supplies : 05/17/2018 Topical wound management initiated : 05/17/2018 Notes: Electronic Signature(s) Signed: 12/27/2018 4:23:12 PM By: Harold Barban Entered By: Harold Barban on 12/27/2018 14:15:41 Steven Meza ([id number]) -------------------------------------------------------------------------------- Pain Assessment Details Patient Name: Steven Meza Date of Service: 12/27/2018 1:30 PM Medical Record Number: [id number] Patient Account Number: [id number] Date of Birth/Sex: Oct 18, 1930 (85 y.o. M) Treating RN: Harold Barban Primary Care Imari Reen: Fulton Reek Other Clinician: Referring Avi Kerschner: Fulton Reek Treating Vaneta Hammontree/Extender: Tito Dine in Treatment: 68 Active Problems Location of Pain Severity and Description of Pain Patient Has Paino Yes Site Locations Rate the pain. Current Pain Level: 3 Pain Management and Medication Current Pain Management: Electronic  Signature(s) Signed: 12/27/2018 2:04:11 PM By: Lorine Bears RCP, RRT, CHT Signed: 12/27/2018 4:23:12 PM By: Harold Barban Entered By: Lorine Bears on 12/27/2018 13:41:43 Steven Meza ([id number]) -------------------------------------------------------------------------------- Patient/Caregiver Education Details Patient Name: Steven Meza Date of Service: 12/27/2018 1:30 PM Medical Record Number: [id number] Patient Account Number: [id number] Date of Birth/Gender: 02-09-1931 (85 y.o. M) Treating RN: Harold Barban Primary Care Physician: Fulton Reek Other Clinician: Referring Physician: Fulton Reek Treating Physician/Extender: Tito Dine in Treatment: 14 Education Assessment Education Provided To: Patient Education Topics Provided Wound/Skin Impairment: Handouts: Caring for Your Ulcer Methods: Demonstration, Explain/Verbal Responses: State content correctly Electronic Signature(s) Signed: 12/27/2018 4:23:12 PM By: Harold Barban Entered By: Harold Barban on 12/27/2018 14:16:17 Steven Meza ([id number]) -------------------------------------------------------------------------------- Wound Assessment Details Patient Name: Steven Meza Date of Service: 12/27/2018 1:30 PM Medical Record Number: [id number] Patient Account Number: [id number] Date of Birth/Sex: 01/02/1931 (85 y.o. M) Treating RN: Army Melia Primary Care Kwane Rohl: Fulton Reek Other Clinician: Referring Onyx Schirmer: Fulton Reek Treating Annemarie Sebree/Extender: Tito Dine in  Treatment: 32 Wound Status Wound Number: 3 Primary Venous Leg Ulcer Etiology: Wound Location: Left Lower Leg - Medial Wound Status: Open Wounding Event: Blister Comorbid Lymphedema, Coronary Artery Disease, Date Acquired: 10/10/2018 History: Hypertension Weeks Of Treatment: 11 Clustered Wound: No Photos Wound Measurements Length: (cm) 0.5 Width: (cm)  0.5 Depth: (cm) 0.1 Area: (cm) 0.196 Volume: (cm) 0.02 % Reduction in Area: 80.8% % Reduction in Volume: 80.4% Epithelialization: None Tunneling: No Undermining: No Wound Description Classification: Partial Thickness Foul Odor Af Wound Margin: Flat and Intact Slough/Fibri Exudate Amount: Medium Exudate Type: Serous Exudate Color: amber ter Cleansing: No no No Wound Bed Granulation Amount: Small (1-33%) Exposed Structure Granulation Quality: Pink Fascia Exposed: No Necrotic Amount: Large (67-100%) Fat Layer (Subcutaneous Tissue) Exposed: Yes Necrotic Quality: Adherent Slough Tendon Exposed: No Muscle Exposed: No Joint Exposed: No Bone Exposed: No Treatment Notes Steven Meza, Steven Meza ([id number]) Wound #3 (Left, Medial Lower Leg) Notes S. cell, abd/foam, 3ll Electronic Signature(s) Signed: 12/27/2018 4:18:00 PM By: Army Melia Entered By: Army Melia on 12/27/2018 13:57:11 Steven Meza ([id number]) -------------------------------------------------------------------------------- Wound Assessment Details Patient Name: Steven Meza Date of Service: 12/27/2018 1:30 PM Medical Record Number: [id number] Patient Account Number: [id number] Date of Birth/Sex: [birth year] (85 y.o. M) Treating RN: Army Melia Primary Care Milica Gully: Fulton Reek Other Clinician: Referring Daizy Outen: Fulton Reek Treating Terryon Pineiro/Extender: Tito Dine in Treatment: 32 Wound Status Wound Number: 5 Primary Pressure Ulcer Etiology: Wound Location: Left Foot - Plantar Wound Status: Open Wounding Event: Pressure Injury Comorbid Lymphedema, Coronary Artery Disease, Date Acquired: 12/08/2018 History: Hypertension Weeks Of Treatment: 1 Clustered Wound: No Photos Wound Measurements Length: (cm) 0.1 Width: (cm) 0.1 Depth: (cm) 0.1 Area: (cm) 0.008 Volume: (cm) 0.001 % Reduction in Area: 95.7% % Reduction in Volume: 94.7% Epithelialization: Small (1-33%) Tunneling:  No Undermining: No Wound Description Classification: Category/Stage II Wound Margin: Flat and Intact Exudate Amount: Small Exudate Type: Serous Exudate Color: amber Foul Odor After Cleansing: No Slough/Fibrino No Wound Bed Granulation Amount: None Present (0%) Exposed Structure Necrotic Amount: Large (67-100%) Fat Layer (Subcutaneous Tissue) Exposed: Yes Necrotic Quality: Eschar Treatment Notes Wound #5 (Left, Plantar Foot) Notes foam and coverlet Steven Meza, Steven Meza ([id number]) Electronic Signature(s) Signed: 12/27/2018 4:18:00 PM By: Army Melia Entered By: Army Melia on 12/27/2018 13:57:48 Steven Meza ([id number]) -------------------------------------------------------------------------------- Vitals Details Patient Name: Steven Meza Date of Service: 12/27/2018 1:30 PM Medical Record Number: [id number] Patient Account Number: [id number] Date of Birth/Sex: 16-Oct-1930 (85 y.o. M) Treating RN: Harold Barban Primary Care Angell Honse: Fulton Reek Other Clinician: Referring Estera Ozier: Fulton Reek Treating Fed Ceci/Extender: Tito Dine in Treatment: 32 Vital Signs Time Taken: 13:42 Temperature (F): 99.5 Height (in): 71 Pulse (bpm): 75 Weight (lbs): 252 Respiratory Rate (breaths/min): 20 Body Mass Index (BMI): 35.1 Blood Pressure (mmHg): 168/47 Reference Range: 80 - 120 mg / dl Electronic Signature(s) Signed: 12/27/2018 2:04:11 PM By: Lorine Bears RCP, RRT, CHT Entered By: Lorine Bears on 12/27/2018 13:44:20

## 2019-01-03 ENCOUNTER — Encounter: Payer: Medicare Other | Attending: Internal Medicine | Admitting: Internal Medicine

## 2019-01-03 ENCOUNTER — Other Ambulatory Visit: Payer: Self-pay

## 2019-01-03 DIAGNOSIS — M1990 Unspecified osteoarthritis, unspecified site: Secondary | ICD-10-CM | POA: Diagnosis not present

## 2019-01-03 DIAGNOSIS — I890 Lymphedema, not elsewhere classified: Secondary | ICD-10-CM | POA: Diagnosis not present

## 2019-01-03 DIAGNOSIS — I70212 Atherosclerosis of native arteries of extremities with intermittent claudication, left leg: Secondary | ICD-10-CM | POA: Diagnosis not present

## 2019-01-03 DIAGNOSIS — Z8551 Personal history of malignant neoplasm of bladder: Secondary | ICD-10-CM | POA: Diagnosis not present

## 2019-01-03 DIAGNOSIS — I10 Essential (primary) hypertension: Secondary | ICD-10-CM | POA: Diagnosis not present

## 2019-01-03 DIAGNOSIS — E11621 Type 2 diabetes mellitus with foot ulcer: Secondary | ICD-10-CM | POA: Diagnosis not present

## 2019-01-03 DIAGNOSIS — L97518 Non-pressure chronic ulcer of other part of right foot with other specified severity: Secondary | ICD-10-CM | POA: Diagnosis not present

## 2019-01-03 DIAGNOSIS — E1151 Type 2 diabetes mellitus with diabetic peripheral angiopathy without gangrene: Secondary | ICD-10-CM | POA: Diagnosis not present

## 2019-01-03 DIAGNOSIS — I2510 Atherosclerotic heart disease of native coronary artery without angina pectoris: Secondary | ICD-10-CM | POA: Insufficient documentation

## 2019-01-05 NOTE — Progress Notes (Signed)
Steven Meza, Steven Meza ([id number]) Visit Report for 01/03/2019 HPI Details Patient Name: Steven Meza, Steven Meza Date of Service: 01/03/2019 2:00 PM Medical Record Number: [id number] Patient Account Number: [id number] Date of Birth/Sex: 1930-11-08 (85 y.o. M) Treating RN: Cornell Barman Primary Care Provider: Fulton Meza Other Clinician: Referring Provider: Fulton Meza Treating Provider/Extender: Tito Dine in Treatment: 35 History of Present Illness HPI Description: ADMISSION 05/17/2018 Mr. Steven Meza is an 85 year old man who is been treated at the lymphedema clinic for a long period with lymphedema wraps for bilateral lower extremity lymphedema. About 6 months ago they noted small open areas on his left lateral calf just above the ankle. These were open. They are apparently putting some form of ointment on this. They have been referred here for our review of this. The patient has a long history of lymphedema and venous stasis. It says in his records that he has recurrent cellulitis although his wife denies this but she states that the lymphedema may have started with cellulitis several years ago. Also in his record there is a history of bladder cancer which they seem to know very little about however he did not have any radiation to the pelvis or anything that could have contributed to lymphedema that they are aware of. There is no prior wound history. The patient has 2 small punched out areas on the left lateral calf that have adherent debris on the surface. Past medical history; lymphedema, hypertension, cellulitis, hearing loss, morbid obesity, osteoarthritis, venous stasis, bladder CA, diverticulosis. ABIs in our clinic were 0.36 on the right and 0.57 on the left 05/24/2018; corrected age on this patient is 85 versus what I said last week. He has been for his arterial studies which predictably are not very good. On the right his ABI is 0.65. Monophasic waveforms noted at the right ankle  including the posterior tibial and dorsalis pedis. He has triphasic waveforms of the common femoral and profunda femoris triphasic proximal SFA with monophasic distal FSA and popliteal artery. Monophasic tibial waveforms. On the left his ABI is 0.58 monophasic waveforms at the left ankle. Duplex of the left lower extremity demonstrated atherosclerotic change with monophasic waveforms throughout the left lower extremity. There was some concern about left iliac disease and potentially femoral-popliteal and tibial disease. The patient does not describe claudication although according to his wife he over emphasizes his activity. Patient states he is limited by pain in both knees His wounds are on the left lateral calf. 2 small areas with necrotic debris. We have been using silver collagen and light Ace wraps 05/31/2018; wounds on the left lateral calf in the setting of very significant lymphedema and probably PAD. We have been using silver collagen. The base of the small wound looks reasonably improved. I sent him to see Dr. Fletcher Anon however I see now he has an appointment with Dr. Alvester Chou on February 12 2/5; left lateral calf wound looks the same. His wife is doing a good job with her lymphedema wraps and maintaining that the edema. He most likely has PAD and is due to see Dr. Gwenlyn Found of infectious disease on February 12 2/19; left lateral calf wounds look about the same. This looks like wound secondary to chronic venous insufficiency with lymphedema however the patient has very poor arterial studies. We referred him to Dr. Gwenlyn Found. Dr. Gwenlyn Found did not feel he needed to do anything from an arterial point of view. He did not address the question about the aggressiveness of compression. After some thoughts about  this I elected to go ahead and put him in 3 layer compression which after all would be less compression then the lymphedema clinic was putting on this. I am hopeful that this should be enough to get  some closure of the small wounds 2/26; left lateral calf wound letter this week. He tolerated the 3 layer compression well furthermore he is sleeping in a hospital bed which helps keep his legs up at night. The wound looks better measuring smaller especially in width 3/3; left lateral calf wound about the same size. The 3 layer compression has really helped get the edema down in his leg Steven Meza, Steven A. ([id number]) however. Using silver collagen 3/11; left lateral calf wound better looking wound surface but about the same size. We have been using 3 layer compression which is really helped get the edema down in the left leg. Even after Dr. Kennon Holter assessment I am reluctant to go to 4 layer compression. He is tolerating 3 layer compression well. We have been using silver collagen 3/18-Patient returns for his left lateral calf wound which overall is looking better, slightly increased in size and dimensions and area, he is tolerating the 3 layer compression, has been dressed with silver collagen which we will continue 3/25oleft lateral calf wound much better looking. Size is smaller. He has been using silver collagen 4/1; gradually getting smaller in size. Using silver collagen with 3 layer compression. I think the patient has some degree of PAD. He saw Dr. Gwenlyn Found in consultation, he did not specifically comment on whether he could tolerate 4 layer compression 4/8; the wound is slightly smaller in depth. We are using 3 layer compression with silver collagen. I had him seen for arterial insufficiency however his ABI was 0.57 in the left leg. Dr. Gwenlyn Found really did not seem to middle on the degree of compression he could tolerate 4/15; the wound is slightly smaller and slightly less deep. We have been using 3 layer compression with silver collagen. There is not an option for 4-layer compression here. 4/22; the patient is making decent progress on his wound on the left lateral calf however he arrives in  with a superficial wound on the right anterior tibial area. Were not really sure how this happened. He had been using a stocking on the right leg. He has been using silver collagen on the wounds Amazing to me he is he appears to have compression pumps at home. I am not really sure I knew this before. In any case he has not been using them 4/29; left lateral calf wound continues to get gradually smaller. This looks like it is on its way to closure. oThe area on the right anterior tibial area is about the same in terms of size superficial without any depth. This was probably wrap injury oHe tells me that he has been using his compression pumps once a day for 1 hour without any pain 5/6; not as good today. Left lateral calf wound is actually larger and he does not have quite as good edema control. The area on the right anterior tibia is about the same still superficial. He states he is using his compression pumps once a day. We are using 3 layer compression on the left and Curlex Coban on the right 5/13; the area on the right is very close to being healed. The area on the left still has some depth but has a healthy wound surface. He has excellent edema control bilaterally. Using Hydrofera Blue 5/20-Patient returns  in 1 week after being in 3 layer compression on the left, the left lateral calf ulcer is stable with some slough, the right anterior tibial ulcer is healed we have been using Kerlix Coban on that leg 5/27; Hydrofera Blue and 3 layer compression. Still some slough in the wound area and some nonviable edges around the wound. 6/3; this patient has lymphedema and chronic venous insufficiency. He had difficult to close wound on his left lateral calf. This is closed today. 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 Finally he has known PAD but he is managed to tolerate the compression we reporting on him and also using his compression pumps that he already  had at home twice a day 6/10; we discharge this patient last week having finally close the small area in the left lateral calf. Apparently by Friday of last week this had opened and was draining. He is therefore back in clinic. His wife who is present also was concerned about an area on the right anterior tibia."o 6/17; the patient does not have an open area on the right leg. On the left he has a small circular area anteriorly and the same area laterally that he had last time. However both of these appear to be improved he has his Farrow wrap 30/40. It turns out that he is only using his external compression pumps once a day. I tried to get him up to twice a day today 6/24; the patient comes in with his external compression garment on the right leg/Farrow wrap. As far as we know no open wounds here. He has 2 areas that are smaller one on the left anterior pretibial and one on the left lateral calf which is the site of his initial wounds. I have successfully caught him into his external compression pumps twice a day 7/1; still using his external compression garment on the right leg. As far as we know there are no open wounds here. The area on the left lateral calf which was the site of his initial wound has closed over. The area on the left anterior pretibial that we identified last week is still open. 7/8; as far as we know he has no open wounds on the right leg. He has his external compression garment on here. The original wound on the left lateral calf has closed over. He developed an area on the left anterior pretibial 2 weeks ago and seems to have 2 smaller areas on the proximal and distal lateral calf. We do not have his good edema control this week as I am used to seeing 7/15; the patient has still a linear area on the left anterior tibia. The area on the left lateral leg looked somewhat inflamed today but no cellulitis. He did have what looked to be a small pustule that I cultured for CandS  although I did not give him empiric antibiotics. 7/22; small area on the left anterior tibia. The area on the left lateral leg still looks inflamed. The pustule that I cultured last week grew Enterobacter and Pseudomonas. I prescribed Cipro they still have not picked that up. In any case the area actually looks fairly satisfactory. He has decent edema control using his compression pumps twice a day Steven Meza, Steven Meza ([id number]) 7/29; the area on the left lateral leg looks a lot better than last week. Nothing is open here. He has an area just medial to the mid anterior tibia that is still open that  is only open wound today. 8/5-Patient returns at 1 week for the left leg ulcer, which is actually looking better, he is also complaining of left plantar foot pain below the great toe. He does have a hematoma here 8/12-Patient returns at 1 week, the left leg open area looks about the same if not slightly better, the left plantar hematoma under the great toe is about the same, patient apparently has not been resting his foot on anything for too long period of time and does not wear the [employer] shoes unless he walks outdoors which he does not do often 8/19; small wound on the left anterior lower leg this is just about closed there is nothing on the left lateral leg. He is using his compression pumps He arrives in clinic today with a black eschar over the right first plantar metatarsal head. This was apparently a bruise that was identified by her nurses last week. He wears crocs on his feet. He also may rub these on a footboard at home 8/26; there is nothing open on the left lateral lower leg he has a small area just medial to the tibia. He is going to see his podiatrist Dr. Milinda Pointer about the area on the left plantar first metatarsal head. He is using his external compression pumps. He has a juxta lite on the right leg 9/2; there is nothing open on either side of her left lower leg. He went to see  podiatry about the area on the left first metatarsal head. This was apparently debrided. He was given mupirocin and oral antibiotics. I do not believe he is offloading this at all. Electronic Signature(s) Signed: 01/03/2019 5:28:46 PM By: Linton Ham MD Entered By: Linton Ham on 01/03/2019 15:58:21 Steven Meza ([id number]) -------------------------------------------------------------------------------- Physical Exam Details Patient Name: Steven Meza Date of Service: 01/03/2019 2:00 PM Medical Record Number: [id number] Patient Account Number: [id number] Date of Birth/Sex: 01-Jun-1930 (85 y.o. M) Treating RN: Cornell Barman Primary Care Provider: Fulton Meza Other Clinician: Referring Provider: Fulton Meza Treating Provider/Extender: Tito Dine in Treatment: 56 Eyes Conjunctivae clear. No discharge. Cardiovascular Pedal pulses are palpable. Lymphatic None palpable in the popliteal area bilaterally. Integumentary (Hair, Skin) Bilateral lymphedema. Psychiatric No evidence of depression, anxiety, or agitation. Calm, cooperative, and communicative. Appropriate interactions and affect.. Notes Wound exam; left leg anteriorly just medial to the tibia is closed. His chronic wound lateral to the tibia is also closed. There is nothing open on the left leg that I can see. No evidence of infection and the edema control is really quite good. oThe area on the first metatarsal head is a diabetic foot ulcer. He went to see podiatry about this and the wound was debrided I am going to discharge the patient to the care of the podiatrist. He is going to need to offload this to get this to heal Electronic Signature(s) Signed: 01/03/2019 5:28:46 PM By: Linton Ham MD Entered By: Linton Ham on 01/03/2019 16:00:03 Steven Meza ([id number]) -------------------------------------------------------------------------------- Physician Orders Details Patient Name: Steven Meza Date of Service: 01/03/2019 2:00 PM Medical Record Number: [id number] Patient Account Number: [id number] Date of Birth/Sex: Dec 06, 1930 (85 y.o. M) Treating RN: Cornell Barman Primary Care Provider: Fulton Meza Other Clinician: Referring Provider: Fulton Meza Treating Provider/Extender: Tito Dine in Treatment: 10 Verbal / Phone Orders: No Diagnosis Coding Edema Control Wound #5 Left,Plantar Metatarsal head first o Patient to wear own Velcro compression garment. - daiiy o Compression Pump: Use compression pump on left  lower extremity for 60 minutes, twice daily. - Twice daily- continue to use pumps o Compression Pump: Use compression pump on right lower extremity for 60 minutes, twice daily. - Twice daily- continue to use pumps Discharge From [facility] Wound #5 Left,Plantar Metatarsal head first o Discharge from [facility] Electronic Signature(s) Signed: 01/03/2019 5:28:46 PM By: Linton Ham MD Signed: 01/05/2019 10:04:45 AM By: Gretta Cool, BSN, RN, CWS, Kim RN, BSN Entered By: Gretta Cool, BSN, RN, CWS, Kim on 01/03/2019 15:03:08 Steven Meza ([id number]) -------------------------------------------------------------------------------- Problem List Details Patient Name: Steven Meza Date of Service: 01/03/2019 2:00 PM Medical Record Number: [id number] Patient Account Number: [id number] Date of Birth/Sex: Aug 21, 1930 (85 y.o. 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 with 05/17/2018  No Yes intermittent claudication, left leg L97.518 Non-pressure chronic ulcer of other part of right foot with 12/20/2018 No Yes other specified severity Inactive Problems ICD-10 Meza Description Active Date Inactive Date L97.211 Non-pressure chronic ulcer of right calf limited to breakdown of skin 08/23/2018 08/23/2018 Resolved Problems Electronic Signature(s) Signed: 01/03/2019 5:28:46 PM By: Linton Ham MD Entered By: Linton Ham on 01/03/2019 15:57:15 Steven Meza ([id number]) -------------------------------------------------------------------------------- Progress Note Details Patient Name: Steven Meza Date of Service: 01/03/2019 2:00 PM Medical Record Number: [id number] Patient Account Number: [id number] Date of Birth/Sex: 05-Feb-1931 (85 y.o. M) Treating RN: Cornell Barman Primary Care Provider: Fulton Meza Other Clinician: Referring Provider: Fulton Meza Treating Provider/Extender: Tito Dine in Treatment: 78 Subjective History of Present Illness (HPI) ADMISSION 05/17/2018 Mr. Zipay is an 85 year old man who is been treated at the lymphedema clinic for a long period with lymphedema wraps for bilateral lower extremity lymphedema. About 6 months ago they noted small open areas on his left lateral calf just above the ankle. These were open. They are apparently putting some form of ointment on this. They have been referred here for our review of this. The patient has a long history of lymphedema and venous stasis. It says in his records that he has recurrent cellulitis although his wife denies this but she states that the lymphedema may have started with cellulitis several years ago. Also in his record there is a history of bladder cancer which they seem to know very little about however he did not have any radiation to the pelvis or anything that could have contributed to lymphedema that they are aware of. There is no prior wound history. The patient  has 2 small punched out areas on the left lateral calf that have adherent debris on the surface. Past medical history; lymphedema, hypertension, cellulitis, hearing loss, morbid obesity, osteoarthritis, venous stasis, bladder CA, diverticulosis. ABIs in our clinic were 0.36 on the right and 0.57 on the left 05/24/2018; corrected age on this patient is 41 versus what I said last week. He has been for his arterial studies which predictably are not very good. On the right his ABI is 0.65. Monophasic waveforms noted at the right ankle including the posterior tibial and dorsalis pedis. He has triphasic waveforms of the common femoral and profunda femoris triphasic proximal SFA with monophasic distal FSA and popliteal artery. Monophasic tibial waveforms. On the left  his ABI is 0.58 monophasic waveforms at the left ankle. Duplex of the left lower extremity demonstrated atherosclerotic change with monophasic waveforms throughout the left lower extremity. There was some concern about left iliac disease and potentially femoral-popliteal and tibial disease. The patient does not describe claudication although according to his wife he over emphasizes his activity. Patient states he is limited by pain in both knees His wounds are on the left lateral calf. 2 small areas with necrotic debris. We have been using silver collagen and light Ace wraps 05/31/2018; wounds on the left lateral calf in the setting of very significant lymphedema and probably PAD. We have been using silver collagen. The base of the small wound looks reasonably improved. I sent him to see Dr. Fletcher Anon however I see now he has an appointment with Dr. Alvester Chou on February 12 2/5; left lateral calf wound looks the same. His wife is doing a good job with her lymphedema wraps and maintaining that the edema. He most likely has PAD and is due to see Dr. Gwenlyn Found of infectious disease on February 12 2/19; left lateral calf wounds look about the same. This  looks like wound secondary to chronic venous insufficiency with lymphedema however the patient has very poor arterial studies. We referred him to Dr. Gwenlyn Found. Dr. Gwenlyn Found did not feel he needed to do anything from an arterial point of view. He did not address the question about the aggressiveness of compression. After some thoughts about this I elected to go ahead and put him in 3 layer compression which after all would be less compression then the lymphedema clinic was putting on this. I am hopeful that this should be enough to get some closure of the small wounds 2/26; left lateral calf wound letter this week. He tolerated the 3 layer compression well furthermore he is sleeping in a hospital bed which helps keep his legs up at night. The wound looks better measuring smaller especially in width 3/3; left lateral calf wound about the same size. The 3 layer compression has really helped get the edema down in his leg however. Using silver collagen 3/11; left lateral calf wound better looking wound surface but about the same size. We have been using 3 layer compression which is really helped get the edema down in the left leg. Even after Dr. Kennon Holter assessment I am reluctant to go to 4 layer Steven Meza, Steven A. ([id number]) compression. He is tolerating 3 layer compression well. We have been using silver collagen 3/18-Patient returns for his left lateral calf wound which overall is looking better, slightly increased in size and dimensions and area, he is tolerating the 3 layer compression, has been dressed with silver collagen which we will continue 3/25 left lateral calf wound much better looking. Size is smaller. He has been using silver collagen 4/1; gradually getting smaller in size. Using silver collagen with 3 layer compression. I think the patient has some degree of PAD. He saw Dr. Gwenlyn Found in consultation, he did not specifically comment on whether he could tolerate 4 layer compression 4/8; the wound  is slightly smaller in depth. We are using 3 layer compression with silver collagen. I had him seen for arterial insufficiency however his ABI was 0.57 in the left leg. Dr. Gwenlyn Found really did not seem to middle on the degree of compression he could tolerate 4/15; the wound is slightly smaller and slightly less deep. We have been using 3 layer compression with silver collagen. There is not an option for 4-layer  compression here. 4/22; the patient is making decent progress on his wound on the left lateral calf however he arrives in with a superficial wound on the right anterior tibial area. Were not really sure how this happened. He had been using a stocking on the right leg. He has been using silver collagen on the wounds Amazing to me he is he appears to have compression pumps at home. I am not really sure I knew this before. In any case he has not been using them 4/29; left lateral calf wound continues to get gradually smaller. This looks like it is on its way to closure. The area on the right anterior tibial area is about the same in terms of size superficial without any depth. This was probably wrap injury He tells me that he has been using his compression pumps once a day for 1 hour without any pain 5/6; not as good today. Left lateral calf wound is actually larger and he does not have quite as good edema control. The area on the right anterior tibia is about the same still superficial. He states he is using his compression pumps once a day. We are using 3 layer compression on the left and Curlex Coban on the right 5/13; the area on the right is very close to being healed. The area on the left still has some depth but has a healthy wound surface. He has excellent edema control bilaterally. Using Hydrofera Blue 5/20-Patient returns in 1 week after being in 3 layer compression on the left, the left lateral calf ulcer is stable with some slough, the right anterior tibial ulcer is healed we have  been using Kerlix Coban on that leg 5/27; Hydrofera Blue and 3 layer compression. Still some slough in the wound area and some nonviable edges around the wound. 6/3; this patient has lymphedema and chronic venous insufficiency. He had difficult to close wound on his left lateral calf. This is closed today. 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 Finally he has known PAD but he is managed to tolerate the compression we reporting on him and also using his compression pumps that he already had at home twice a day 6/10; we discharge this patient last week having finally close the small area in the left lateral calf. Apparently by Friday of last week this had opened and was draining. He is therefore back in clinic. His wife who is present also was concerned about an area on the right anterior tibia."o 6/17; the patient does not have an open area on the right leg. On the left he has a small circular area anteriorly and the same area laterally that he had last time. However both of these appear to be improved he has his Farrow wrap 30/40. It turns out that he is only using his external compression pumps once a day. I tried to get him up to twice a day today 6/24; the patient comes in with his external compression garment on the right leg/Farrow wrap. As far as we know no open wounds here. He has 2 areas that are smaller one on the left anterior pretibial and one on the left lateral calf which is the site of his initial wounds. I have successfully caught him into his external compression pumps twice a day 7/1; still using his external compression garment on the right leg. As far as we know there are no open wounds here. The area on the left lateral calf  which was the site of his initial wound has closed over. The area on the left anterior pretibial that we identified last week is still open. 7/8; as far as we know he has no open wounds on the right leg. He has his  external compression garment on here. The original wound on the left lateral calf has closed over. He developed an area on the left anterior pretibial 2 weeks ago and seems to have 2 smaller areas on the proximal and distal lateral calf. We do not have his good edema control this week as I am used to seeing 7/15; the patient has still a linear area on the left anterior tibia. The area on the left lateral leg looked somewhat inflamed today but no cellulitis. He did have what looked to be a small pustule that I cultured for CandS although I did not give him empiric antibiotics. 7/22; small area on the left anterior tibia. The area on the left lateral leg still looks inflamed. The pustule that I cultured last week grew Enterobacter and Pseudomonas. I prescribed Cipro they still have not picked that up. In any case the area actually looks fairly satisfactory. He has decent edema control using his compression pumps twice a day 7/29; the area on the left lateral leg looks a lot better than last week. Nothing is open here. He has an area just medial to the mid anterior tibia that is still open that is only open wound today. 8/5-Patient returns at 1 week for the left leg ulcer, which is actually looking better, he is also complaining of left plantar foot Steven Meza, Steven Meza A. ([id number]) pain below the great toe. He does have a hematoma here 8/12-Patient returns at 1 week, the left leg open area looks about the same if not slightly better, the left plantar hematoma under the great toe is about the same, patient apparently has not been resting his foot on anything for too long period of time and does not wear the [employer]kenstock shoes unless he walks outdoors which he does not do often 8/19; small wound on the left anterior lower leg this is just about closed there is nothing on the left lateral leg. He is using his compression pumps He arrives in clinic today with a black eschar over the right first plantar  metatarsal head. This was apparently a bruise that was identified by her nurses last week. He wears crocs on his feet. He also may rub these on a footboard at home 8/26; there is nothing open on the left lateral lower leg he has a small area just medial to the tibia. He is going to see his podiatrist Dr. Milinda Pointer about the area on the left plantar first metatarsal head. He is using his external compression pumps. He has a juxta lite on the right leg 9/2; there is nothing open on either side of her left lower leg. He went to see podiatry about the area on the left first metatarsal head. This was apparently debrided. He was given mupirocin and oral antibiotics. I do not believe he is offloading this at all. Objective Constitutional Vitals Time Taken: 2:10 PM, Height: 71 in, Weight: 252 lbs, BMI: 35.1, Temperature: 98.3 F, Pulse: 81 bpm, Respiratory Rate: 18 breaths/min, Blood Pressure: 152/49 mmHg. Eyes Conjunctivae clear. No discharge. Cardiovascular Pedal pulses are palpable. Lymphatic None palpable in the popliteal area bilaterally. Psychiatric No evidence of depression, anxiety, or agitation. Calm, cooperative, and communicative. Appropriate interactions and affect.. General Notes: Wound exam; left  leg anteriorly just medial to the tibia is closed. His chronic wound lateral to the tibia is also closed. There is nothing open on the left leg that I can see. No evidence of infection and the edema control is really quite good. The area on the first metatarsal head is a diabetic foot ulcer. He went to see podiatry about this and the wound was debrided I am going to discharge the patient to the care of the podiatrist. He is going to need to offload this to get this to heal Integumentary (Hair, Skin) Bilateral lymphedema. Wound #3 status is Healed - Epithelialized. Original cause of wound was Blister. The wound is located on the Left,Medial Lower Leg. The wound measures 0cm length x 0cm width x  0cm depth; 0cm^2 area and 0cm^3 volume. There is Fat Layer (Subcutaneous Tissue) Exposed exposed. There is no tunneling or undermining noted. There is a medium amount of serous drainage noted. The wound margin is flat and intact. There is large (67-100%) pink granulation within the wound bed. There is no necrotic tissue within the wound bed. Wound #5 status is Open. Original cause of wound was Pressure Injury. The wound is located on the Left,Plantar Metatarsal head first. The wound measures 0.1cm length x 0.1cm width x 0.1cm depth; 0.008cm^2 area and 0.001cm^3 volume. There LIONELL, Steven Meza ([id number]) is Fat Layer (Subcutaneous Tissue) Exposed exposed. There is no tunneling or undermining noted. There is a none present amount of drainage noted. The wound margin is flat and intact. There is no granulation within the wound bed. There is a large (67-100%) amount of necrotic tissue within the wound bed including Eschar. 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 - daiiy Compression Pump: Use compression pump on left lower extremity for 60 minutes, twice daily. - Twice daily-continue to use pumps Compression Pump: Use compression pump on right lower extremity for 60 minutes, twice daily. - Twice daily-continue to use pumps Discharge From [facility]: Wound #5 Left,Plantar Metatarsal head first: Discharge from [facility] - Treatment Complete 1. The patient can be discharged to his juxta lite stockings and twice daily compression pump usage at home. 2. We will have to see how long this takes full  something reopens. If he does not control the edema there is almost certainly going to be skin breakdown. 3. The wound was debrided on the left foot. I am going to leave this in the care of podiatry 4. I am discharging the patient from the clinic Electronic Signature(s) Signed: 01/03/2019 5:28:46 PM By: Linton Ham MD Entered By: Linton Ham on 01/03/2019 16:01:07 Steven Meza ([id number]) -------------------------------------------------------------------------------- SuperBill Details Patient Name: Steven Meza Date of Service: 01/03/2019 Medical Record Number: [id number] Patient Account Number: [id number] Date of Birth/Sex: 07/23/1930 (85 y.o. M) Treating RN: Cornell Barman Primary Care Provider: Fulton Meza Other Clinician: Referring Provider: Fulton Meza Treating Provider/Extender: Tito Dine in Treatment: 33 Diagnosis Coding ICD-10 Codes Meza Description [phone] Non-pressure chronic ulcer of left calf with other specified severity I89.0 Lymphedema, not elsewhere classified I70.212 Atherosclerosis of native arteries of extremities with intermittent claudication, left leg L97.518 Non-pressure chronic ulcer of other part of right foot with other specified severity Facility  Procedures CPT4 Meza: [id number] Description: [id number] - WOUND CARE VISIT-LEV 2 EST PT Modifier: Quantity: 1 Physician Procedures CPT4 Meza: [id number] Description: [phone] - WC PHYS LEVEL 2 - EST PT ICD-10 Diagnosis Description L97.228 Non-pressure chronic ulcer of left calf with other specified se I89.0 Lymphedema, not elsewhere classified Modifier: verity Quantity: 1 Electronic Signature(s) Signed: 01/03/2019 5:28:46 PM By: Linton Ham MD Entered By: Linton Ham on 01/03/2019 16:01:32

## 2019-01-05 NOTE — Progress Notes (Signed)
Steven Meza, Steven Meza ([id number]) Visit Report for 01/03/2019 Arrival Information Details Patient Name: Steven Meza, Steven Meza Date of Service: 01/03/2019 2:00 PM Medical Record Number: [id number] Patient Account Number: [id number] Date of Birth/Sex: 10-Jul-1930 (85 y.o. M) Treating RN: Steven Meza Primary Care Steven Meza: Steven Meza Other Clinician: Referring Steven Meza: Steven Meza Treating Steven Meza/Extender: Steven Meza in Treatment: 25 Visit Information History Since Last Visit Added or deleted any medications: Yes Patient Arrived: Cane Any new allergies or adverse reactions: No Arrival Time: 14:06 Had a fall or experienced change in No Accompanied By: wife activities of daily living that may affect Transfer Assistance: None risk of falls: Patient Identification Verified: Yes Signs or symptoms of abuse/neglect since last visito No Secondary Verification Process Completed: Yes Hospitalized since last visit: No Implantable device outside of the clinic excluding No cellular tissue based products placed in the center since last visit: Pain Present Now: No Electronic Signature(s) Signed: 01/03/2019 4:27:13 PM By: Steven Meza RCP, RRT, CHT Entered By: Steven Meza on 01/03/2019 14:10:16 Steven Meza ([id number]) -------------------------------------------------------------------------------- Clinic Level of Care Assessment Details Patient Name: Steven Meza Date of Service: 01/03/2019 2:00 PM Medical Record Number: [id number] Patient Account Number: [id number] Date of Birth/Sex: 11/26/1930 (85 y.o. M) Treating RN: Steven Meza Primary Care Steven Meza: Steven Meza Other Clinician: Referring Steven Meza: Steven Meza Treating Steven Meza/Extender: Steven Meza in Treatment: 32 Clinic Level of Care Assessment Items TOOL 4 Quantity Score []  - Use when only an EandM is performed on FOLLOW-UP visit 0 ASSESSMENTS - Nursing Assessment /  Reassessment []  - Reassessment of Co-morbidities (includes updates in patient status) 0 []  - 0 Reassessment of Adherence to Treatment Plan ASSESSMENTS - Wound and Skin Assessment / Reassessment X - Simple Wound Assessment / Reassessment - one wound 1 5 []  - 0 Complex Wound Assessment / Reassessment - multiple wounds []  - 0 Dermatologic / Skin Assessment (not related to wound area) ASSESSMENTS - Focused Assessment []  - Circumferential Edema Measurements - multi extremities 0 []  - 0 Nutritional Assessment / Counseling / Intervention []  - 0 Lower Extremity Assessment (monofilament, tuning fork, pulses) []  - 0 Peripheral Arterial Disease Assessment (using hand held doppler) ASSESSMENTS - Ostomy and/or Continence Assessment and Care []  - Incontinence Assessment and Management 0 []  - 0 Ostomy Care Assessment and Management (repouching, etc.) PROCESS - Coordination of Care X - Simple Patient / Family Education for ongoing care 1 15 []  - 0 Complex (extensive) Patient / Family Education for ongoing care []  - 0 Staff obtains Programmer, systems, Records, Test Results / Process Orders []  - 0 Staff telephones HHA, Nursing Homes / Clarify orders / etc []  - 0 Routine Transfer to another Facility (non-emergent condition) []  - 0 Routine Hospital Admission (non-emergent condition) []  - 0 New Admissions / Biomedical engineer / Ordering NPWT, Apligraf, etc. []  - 0 Emergency Hospital Admission (emergent condition) X- 1 10 Simple Discharge Coordination Steven Meza, Steven Meza ([id number]) []  - 0 Complex (extensive) Discharge Coordination PROCESS - Special Needs []  - Pediatric / Minor Patient Management 0 []  - 0 Isolation Patient Management []  - 0 Hearing / Language / Visual special needs []  - 0 Assessment of Community assistance (transportation, D/C planning, etc.) []  - 0 Additional assistance / Altered mentation []  - 0 Support Surface(s) Assessment (bed, cushion, seat, etc.) INTERVENTIONS -  Wound Cleansing / Measurement X - Simple Wound Cleansing - one wound 1 5 []  - 0 Complex Wound Cleansing - multiple wounds X- 1 5 Wound Imaging (photographs - any  number of wounds) []  - 0 Wound Tracing (instead of photographs) X- 1 5 Simple Wound Measurement - one wound []  - 0 Complex Wound Measurement - multiple wounds INTERVENTIONS - Wound Dressings []  - Small Wound Dressing one or multiple wounds 0 []  - 0 Medium Wound Dressing one or multiple wounds []  - 0 Large Wound Dressing one or multiple wounds []  - 0 Application of Medications - topical []  - 0 Application of Medications - injection INTERVENTIONS - Miscellaneous []  - External ear exam 0 []  - 0 Specimen Collection (cultures, biopsies, blood, body fluids, etc.) []  - 0 Specimen(s) / Culture(s) sent or taken to Lab for analysis []  - 0 Patient Transfer (multiple staff / Civil Service fast streamer / Similar devices) []  - 0 Simple Staple / Suture removal (25 or less) []  - 0 Complex Staple / Suture removal (26 or more) []  - 0 Hypo / Hyperglycemic Management (close monitor of Blood Glucose) []  - 0 Ankle / Brachial Index (ABI) - do not check if billed separately X- 1 5 Vital Signs Steven Meza, Steven Meza ([id number]) Has the patient been seen at the hospital within the last three years: Yes Total Score: 50 Level Of Care: New/Established - Level 2 Electronic Signature(s) Signed: 01/05/2019 10:04:45 AM By: Steven Meza, BSN, RN, CWS, Kim RN, BSN Entered By: Steven Meza, BSN, RN, CWS, Kim on 01/03/2019 15:01:54 Steven Meza ([id number]) -------------------------------------------------------------------------------- Encounter Discharge Information Details Patient Name: Steven Meza Date of Service: 01/03/2019 2:00 PM Medical Record Number: [id number] Patient Account Number: [id number] Date of Birth/Sex: Mar 02, 1931 (85 y.o. M) Treating RN: Steven Meza Primary Care Steven Meza: Steven Meza Other Clinician: Referring Steven Meza: Steven Meza Treating Steven Meza/Extender: Steven Meza in Treatment: 68 Encounter Discharge Information Items Discharge Condition: Stable Ambulatory Status: Cane Discharge Destination: Home Transportation: Private Auto Accompanied By: spouse Schedule Follow-up Appointment: Yes Clinical Summary of Care: Electronic Signature(s) Signed: 01/03/2019 4:31:02 PM By: Steven Meza Entered By: Steven Meza on 01/03/2019 15:04:53 Steven Meza ([id number]) -------------------------------------------------------------------------------- Lower Extremity Assessment Details Patient Name: Steven Meza Date of Service: 01/03/2019 2:00 PM Medical Record Number: [id number] Patient Account Number: [id number] Date of Birth/Sex: 03-15-1931 (85 y.o. M) Treating RN: Montey Hora Primary Care Latravious Levitt: Steven Meza Other Clinician: Referring Danilynn Jemison: Steven Meza Treating Panayiotis [place]/Extender: Steven Meza in Treatment: 33 Edema Assessment Assessed: [Left: No] [Right: No] Edema: [Left: Ye] [Right: s] Calf Left: Right: Point of Measurement: 32 cm From Medial Instep 43.2 cm cm Ankle Left: Right: Point of Measurement: 11 cm From Medial Instep 29 cm cm Vascular Assessment Pulses: Dorsalis Pedis Palpable: [Left:Yes] Electronic Signature(s) Signed: 01/03/2019 4:15:15 PM By: Montey Hora Entered By: Montey Hora on 01/03/2019 14:22:43 Steven Meza ([id number]) -------------------------------------------------------------------------------- Multi Wound Chart Details Patient Name: Steven Meza Date of Service: 01/03/2019 2:00 PM Medical Record Number: [id number] Patient Account Number: [id number] Date of Birth/Sex: 04-06-1931 (85 y.o. M) Treating RN: Steven Meza Primary Care Teona Vargus: Steven Meza Other Clinician: Referring Chayil Gantt: Steven Meza Treating Ellias Mcelreath/Extender: Steven Meza in Treatment: 63 Vital Signs Height(in): 71 Pulse(bpm):  81 Weight(lbs): 252 Blood Pressure(mmHg): 152/49 Body Mass Index(BMI): 35 Temperature(F): 98.3 Respiratory Rate 18 (breaths/min): Photos: [N/A:N/A] Wound Location: Left Lower Leg - Medial Left Metatarsal head first - N/A Plantar Wounding Event: Blister Pressure Injury N/A Primary Etiology: Venous Leg Ulcer Pressure Ulcer N/A Comorbid History: Lymphedema, Coronary Artery Lymphedema, Coronary Artery N/A Disease, Hypertension Disease, Hypertension Date Acquired: 10/10/2018 12/08/2018 N/A Weeks of Treatment: 12 2 N/A Wound Status: Healed - Epithelialized Open N/A Measurements L  x W x D 0x0x0 0.1x0.1x0.1 N/A (cm) Area (cm) : 0 0.008 N/A Volume (cm) : 0 0.001 N/A % Reduction in Area: 100.00% 95.70% N/A % Reduction in Volume: 100.00% 94.70% N/A Classification: Partial Thickness Category/Stage II N/A Exudate Amount: Medium None Present N/A Exudate Type: Serous N/A N/A Exudate Color: amber N/A N/A Wound Margin: Flat and Intact Flat and Intact N/A Granulation Amount: Large (67-100%) None Present (0%) N/A Granulation Quality: Pink N/A N/A Necrotic Amount: None Present (0%) Large (67-100%) N/A Necrotic Tissue: N/A Eschar N/A Exposed Structures: Fat Layer (Subcutaneous Fat Layer (Subcutaneous N/A Tissue) Exposed: Yes Tissue) Exposed: Yes Fascia: No Tendon: No Muscle: No Steven Meza, Steven Meza (Steven Meza) Joint: No Bone: No Epithelialization: Small (1-33%) Small (1-33%) N/A Treatment Notes Electronic Signature(s) Signed: 01/03/2019 5:28:46 PM By: Linton Ham MD Entered By: Linton Ham on 01/03/2019 15:57:28 Steven Meza (Steven Meza) -------------------------------------------------------------------------------- Multi-Disciplinary Care Plan Details Patient Name: Steven Meza Date of Service: 01/03/2019 2:00 PM Medical Record Number: Steven Meza Patient Account Number: [id number] Date of Birth/Sex: 10/28/1930 (85 y.o. M) Treating RN: Steven Meza Primary Care Arin Peral:  Steven Meza Other Clinician: Referring Tiasha Helvie: Steven Meza Treating [place]/Extender: Steven Meza in Treatment: 28 Active Inactive Electronic Signature(s) Signed: 01/04/2019 3:54:29 PM By: Steven Meza, BSN, RN, CWS, Kim RN, BSN Entered By: Steven Meza, BSN, RN, CWS, Kim on 01/04/2019 15:54:28 Steven Meza (Steven Meza) -------------------------------------------------------------------------------- Pain Assessment Details Patient Name: Steven Meza Date of Service: 01/03/2019 2:00 PM Medical Record Number: Steven Meza Patient Account Number: [id number] Date of Birth/Sex: Sep 01, 1930 (85 y.o. M) Treating RN: Steven Meza Primary Care Annise Boran: Steven Meza Other Clinician: Referring Kylil Swopes: Steven Meza Treating Velna Hedgecock/Extender: Steven Meza in Treatment: 48 Active Problems Location of Pain Severity and Description of Pain Patient Has Paino No Site Locations Pain Management and Medication Current Pain Management: Electronic Signature(s) Signed: 01/03/2019 4:27:13 PM By: Steven Meza RCP, RRT, CHT Signed: 01/05/2019 10:04:45 AM By: Steven Meza, BSN, RN, CWS, Kim RN, BSN Entered By: Steven Meza on 01/03/2019 14:10:24 Steven Meza (Steven Meza) -------------------------------------------------------------------------------- Wound Assessment Details Patient Name: Steven Meza Date of Service: 01/03/2019 2:00 PM Medical Record Number: Steven Meza Patient Account Number: [id number] Date of Birth/Sex: 16-Sep-1930 (85 y.o. M) Treating RN: Steven Meza Primary Care Erendira Crabtree: Steven Meza Other Clinician: Referring Nickola Lenig: Steven Meza Treating Antwan Bribiesca/Extender: Steven Meza in Treatment: 33 Wound Status Wound Number: 3 Primary Venous Leg Ulcer Etiology: Wound Location: Left Lower Leg - Medial Wound Status: Healed - Epithelialized Wounding Event: Blister Comorbid Lymphedema, Coronary Artery Disease, Date  Acquired: 10/10/2018 History: Hypertension Weeks Of Treatment: 12 Clustered Wound: No Photos Wound Measurements Length: (cm) 0 % Reduct Width: (cm) 0 % Reduct Depth: (cm) 0 Epitheli Area: (cm) 0 Tunneli Volume: (cm) 0 Undermi ion in Area: 100% ion in Volume: 100% alization: Small (1-33%) ng: No ning: No Wound Description Classification: Partial Thickness Wound Margin: Flat and Intact Exudate Amount: Medium Exudate Type: Serous Exudate Color: amber Foul Odor After Cleansing: No Slough/Fibrino No Wound Bed Granulation Amount: Large (67-100%) Exposed Structure Granulation Quality: Pink Fascia Exposed: No Necrotic Amount: None Present (0%) Fat Layer (Subcutaneous Tissue) Exposed: Yes Tendon Exposed: No Muscle Exposed: No Joint Exposed: No Bone Exposed: No Electronic Signature(s) Steven Meza, Steven Meza (Steven Meza) Signed: 01/05/2019 10:04:45 AM By: Steven Meza, BSN, RN, CWS, Kim RN, BSN Entered By: Steven Meza, BSN, RN, CWS, Kim on 01/03/2019 14:57:07 Steven Meza (Steven Meza) -------------------------------------------------------------------------------- Wound Assessment Details Patient Name: Steven Meza Date of Service: 01/03/2019 2:00 PM Medical Record Number: Steven Meza  Patient Account Number: [id number] Date of Birth/Sex: [birth year] (85 y.o. M) Treating RN: Montey Hora Primary Care Cindy Brindisi: Steven Meza Other Clinician: Referring Virdia Ziesmer: Steven Meza Treating Thadeus Gandolfi/Extender: Steven Meza in Treatment: 33 Wound Status Wound Number: 5 Primary Pressure Ulcer Etiology: Wound Location: Left Metatarsal head first - Plantar Wound Status: Open Wounding Event: Pressure Injury Comorbid Lymphedema, Coronary Artery Disease, Date Acquired: 12/08/2018 History: Hypertension Weeks Of Treatment: 2 Clustered Wound: No Photos Wound Measurements Length: (cm) 0.1 Width: (cm) 0.1 Depth: (cm) 0.1 Area: (cm) 0.008 Volume: (cm) 0.001 % Reduction in Area:  95.7% % Reduction in Volume: 94.7% Epithelialization: Small (1-33%) Tunneling: No Undermining: No Wound Description Classification: Category/Stage II Wound Margin: Flat and Intact Exudate Amount: None Present Foul Odor After Cleansing: No Slough/Fibrino No Wound Bed Granulation Amount: None Present (0%) Exposed Structure Necrotic Amount: Large (67-100%) Fat Layer (Subcutaneous Tissue) Exposed: Yes Necrotic Quality: Eschar Electronic Signature(s) Signed: 01/03/2019 4:15:15 PM By: Montey Hora Entered By: Montey Hora on 01/03/2019 14:28:30 Steven Meza ([id number]) -------------------------------------------------------------------------------- Vitals Details Patient Name: Steven Meza Date of Service: 01/03/2019 2:00 PM Medical Record Number: [id number] Patient Account Number: [id number] Date of Birth/Sex: January 22, 1931 (85 y.o. M) Treating RN: Steven Meza Primary Care Myliyah Rebuck: Steven Meza Other Clinician: Referring Katori Wirsing: Steven Meza Treating [place]/Extender: Steven Meza in Treatment: 22 Vital Signs Time Taken: 14:10 Temperature (F): 98.3 Height (in): 71 Pulse (bpm): 81 Weight (lbs): 252 Respiratory Rate (breaths/min): 18 Body Mass Index (BMI): 35.1 Blood Pressure (mmHg): 152/49 Reference Range: 80 - 120 mg / dl Electronic Signature(s) Signed: 01/03/2019 4:27:13 PM By: Steven Meza RCP, RRT, CHT Entered By: Steven Meza on 01/03/2019 14:15:22

## 2019-01-10 ENCOUNTER — Encounter: Payer: Self-pay | Admitting: Podiatry

## 2019-01-10 ENCOUNTER — Ambulatory Visit (INDEPENDENT_AMBULATORY_CARE_PROVIDER_SITE_OTHER): Payer: Medicare Other | Admitting: Podiatry

## 2019-01-10 ENCOUNTER — Other Ambulatory Visit: Payer: Self-pay

## 2019-01-10 ENCOUNTER — Ambulatory Visit: Payer: Medicare Other | Admitting: Internal Medicine

## 2019-01-10 DIAGNOSIS — L97521 Non-pressure chronic ulcer of other part of left foot limited to breakdown of skin: Secondary | ICD-10-CM | POA: Diagnosis not present

## 2019-01-10 NOTE — Progress Notes (Signed)
He presents today for follow-up of his sub-first metatarsal phalangeal joint lesion left foot.  States that he has been taking his antibiotics and his wife has been dressing his foot on a daily basis.  She states that he has been doing much better and has felt much better.  Objective: Vital signs are stable he is alert and oriented x3.  Reactive hyperkeratotic lesion sub-first metatarsal phalangeal joint of the left foot appears to be healing very nicely.  There is no open lesions or wounds noted now.  Still compliant wears compression dressings to his legs for his lymphedema.  Assessment: Chronic ulceration slowly resolving left.  Plan: Debrided the area today continue current therapies.  I would like to follow-up with him on a day that Liliane Channel is here so that we might see by getting him a pair of custom molded shoes and may be some type of brace to wear on his right shoe or leg they would keep that shoe and foot from rolling in.

## 2019-01-22 ENCOUNTER — Encounter: Payer: Self-pay | Admitting: Podiatry

## 2019-01-22 ENCOUNTER — Ambulatory Visit (INDEPENDENT_AMBULATORY_CARE_PROVIDER_SITE_OTHER): Payer: Medicare Other | Admitting: Podiatry

## 2019-01-22 ENCOUNTER — Other Ambulatory Visit: Payer: Self-pay

## 2019-01-22 DIAGNOSIS — M79674 Pain in right toe(s): Secondary | ICD-10-CM

## 2019-01-22 DIAGNOSIS — M79675 Pain in left toe(s): Secondary | ICD-10-CM

## 2019-01-22 DIAGNOSIS — B351 Tinea unguium: Secondary | ICD-10-CM

## 2019-01-22 DIAGNOSIS — I878 Other specified disorders of veins: Secondary | ICD-10-CM

## 2019-01-22 NOTE — Progress Notes (Signed)
Complaint:  Visit Type: Patient returns to my office for continued preventative foot care services. Complaint: Patient states" my nails have grown long and thick and become painful to walk and wear shoes"    Patient says he is here solely for nail care. The patient presents for preventative foot care services. No changes to ROS  Podiatric Exam: Vascular: dorsalis pedis and posterior tibial pulses are weakly palpable bilateral due to swelling.. Capillary return is immediate. Temperature gradient is WNL. Skin turgor WNL  Sensorium: Normal Semmes Weinstein monofilament test. Normal tactile sensation bilaterally. Nail Exam: Pt has thick disfigured discolored nails with subungual debris noted bilateral entire nail hallux through fifth toenails Ulcer Exam: There is no evidence of ulcer or pre-ulcerative changes or infection. Orthopedic Exam: Muscle tone and strength are WNL. No limitations in general ROM. No crepitus or effusions noted. Foot type and digits show no abnormalities. Bony prominences are unremarkable. Skin: No Porokeratosis. No infection or ulcers.  Healing ulcer sub 1 left foot.  Diagnosis:  Onychomycosis, , Pain in right toe, pain in left toes  Treatment & Plan Procedures and Treatment: Consent by patient was obtained for treatment procedures.   Debridement of mycotic and hypertrophic toenails, 1 through 5 bilateral and clearing of subungual debris. No ulceration, no infection noted.  Return Visit-Office Procedure: Patient instructed to return to the office for a follow up visit 3 months for continued evaluation and treatment.    Gardiner Barefoot DPM

## 2019-02-07 ENCOUNTER — Ambulatory Visit: Payer: Medicare Other | Admitting: Orthotics

## 2019-02-07 ENCOUNTER — Ambulatory Visit (INDEPENDENT_AMBULATORY_CARE_PROVIDER_SITE_OTHER): Payer: Medicare Other | Admitting: Podiatry

## 2019-02-07 ENCOUNTER — Encounter: Payer: Self-pay | Admitting: Podiatry

## 2019-02-07 ENCOUNTER — Other Ambulatory Visit: Payer: Self-pay

## 2019-02-07 DIAGNOSIS — B351 Tinea unguium: Secondary | ICD-10-CM

## 2019-02-07 DIAGNOSIS — Q828 Other specified congenital malformations of skin: Secondary | ICD-10-CM

## 2019-02-07 DIAGNOSIS — L03119 Cellulitis of unspecified part of limb: Secondary | ICD-10-CM

## 2019-02-07 DIAGNOSIS — M79675 Pain in left toe(s): Secondary | ICD-10-CM

## 2019-02-07 DIAGNOSIS — I878 Other specified disorders of veins: Secondary | ICD-10-CM

## 2019-02-07 DIAGNOSIS — L97521 Non-pressure chronic ulcer of other part of left foot limited to breakdown of skin: Secondary | ICD-10-CM

## 2019-02-07 NOTE — Progress Notes (Signed)
Need to order Apis Bunion shoe, 13 xxw Then to hanger for lateral wedge RIGHT side, buttress.

## 2019-02-07 NOTE — Progress Notes (Signed)
He presents today to see Steven Meza.  He states that he seems to be doing better and his wife Steven Meza states that his foot looks much better but his legs are weeping.  Objective: Vital signs are stable he is alert and oriented x3.  Rick evaluated his feet for shoes today.  We also evaluated his left foot which demonstrates well-healed sub-first metatarsal phalangeal joint hematoma no signs of abscess.  I debrided the reactive hyperkeratotic tissue today which was minimal and this looks much better.  Assessment: Well-healing abscess and ulcerative lesion sub-first metatarsophalangeal joint left.  He has stasis ulcerations to the anterior and anteromedial left leg and calf.  Plan: I recommended that he follow back up with wound care.  He will follow-up with Steven Meza for shoes.

## 2019-03-08 ENCOUNTER — Encounter: Payer: Self-pay | Admitting: Intensive Care

## 2019-03-08 ENCOUNTER — Encounter: Payer: Medicare Other | Admitting: Physician Assistant

## 2019-03-08 ENCOUNTER — Inpatient Hospital Stay
Admission: EM | Admit: 2019-03-08 | Discharge: 2019-03-13 | DRG: 872 | Disposition: A | Discharge status: Skilled Nursing Facility | Payer: Medicare Other | Source: Ambulatory Visit | Attending: Internal Medicine | Admitting: Internal Medicine

## 2019-03-08 ENCOUNTER — Other Ambulatory Visit: Payer: Self-pay

## 2019-03-08 DIAGNOSIS — N179 Acute kidney failure, unspecified: Secondary | ICD-10-CM | POA: Diagnosis present

## 2019-03-08 DIAGNOSIS — Z8546 Personal history of malignant neoplasm of prostate: Secondary | ICD-10-CM

## 2019-03-08 DIAGNOSIS — Z87891 Personal history of nicotine dependence: Secondary | ICD-10-CM

## 2019-03-08 DIAGNOSIS — Z833 Family history of diabetes mellitus: Secondary | ICD-10-CM | POA: Diagnosis not present

## 2019-03-08 DIAGNOSIS — E860 Dehydration: Secondary | ICD-10-CM | POA: Diagnosis present

## 2019-03-08 DIAGNOSIS — L03115 Cellulitis of right lower limb: Secondary | ICD-10-CM | POA: Diagnosis present

## 2019-03-08 DIAGNOSIS — G4730 Sleep apnea, unspecified: Secondary | ICD-10-CM | POA: Diagnosis present

## 2019-03-08 DIAGNOSIS — Z20828 Contact with and (suspected) exposure to other viral communicable diseases: Secondary | ICD-10-CM | POA: Diagnosis present

## 2019-03-08 DIAGNOSIS — J449 Chronic obstructive pulmonary disease, unspecified: Secondary | ICD-10-CM | POA: Diagnosis present

## 2019-03-08 DIAGNOSIS — Z23 Encounter for immunization: Secondary | ICD-10-CM

## 2019-03-08 DIAGNOSIS — R6520 Severe sepsis without septic shock: Secondary | ICD-10-CM | POA: Diagnosis present

## 2019-03-08 DIAGNOSIS — Z7982 Long term (current) use of aspirin: Secondary | ICD-10-CM

## 2019-03-08 DIAGNOSIS — L03116 Cellulitis of left lower limb: Secondary | ICD-10-CM | POA: Diagnosis present

## 2019-03-08 DIAGNOSIS — Z8673 Personal history of transient ischemic attack (TIA), and cerebral infarction without residual deficits: Secondary | ICD-10-CM

## 2019-03-08 DIAGNOSIS — I890 Lymphedema, not elsewhere classified: Secondary | ICD-10-CM | POA: Diagnosis present

## 2019-03-08 DIAGNOSIS — I10 Essential (primary) hypertension: Secondary | ICD-10-CM | POA: Diagnosis present

## 2019-03-08 DIAGNOSIS — Z8551 Personal history of malignant neoplasm of bladder: Secondary | ICD-10-CM | POA: Diagnosis not present

## 2019-03-08 DIAGNOSIS — A419 Sepsis, unspecified organism: Principal | ICD-10-CM | POA: Diagnosis present

## 2019-03-08 DIAGNOSIS — I878 Other specified disorders of veins: Secondary | ICD-10-CM | POA: Diagnosis present

## 2019-03-08 DIAGNOSIS — Z79899 Other long term (current) drug therapy: Secondary | ICD-10-CM

## 2019-03-08 DIAGNOSIS — Z6841 Body Mass Index (BMI) 40.0 and over, adult: Secondary | ICD-10-CM | POA: Diagnosis not present

## 2019-03-08 HISTORY — DX: Lymphedema, not elsewhere classified: I89.0

## 2019-03-08 LAB — COMPREHENSIVE METABOLIC PANEL
ALT: 17 U/L (ref 0–44)
AST: 22 U/L (ref 15–41)
Albumin: 3.1 g/dL — ABNORMAL LOW (ref 3.5–5.0)
Alkaline Phosphatase: 107 U/L (ref 38–126)
Anion gap: 12 (ref 5–15)
BUN: 58 mg/dL — ABNORMAL HIGH (ref 8–23)
CO2: 24 mmol/L (ref 22–32)
Calcium: 9.1 mg/dL (ref 8.9–10.3)
Chloride: 103 mmol/L (ref 98–111)
Creatinine, Ser: 1.88 mg/dL — ABNORMAL HIGH (ref 0.61–1.24)
GFR calc Af Amer: 37 mL/min — ABNORMAL LOW (ref >60)
GFR calc non Af Amer: 32 mL/min — ABNORMAL LOW (ref >60)
Glucose, Bld: 175 mg/dL — ABNORMAL HIGH (ref 70–99)
Potassium: 4.4 mmol/L (ref 3.5–5.1)
Sodium: 139 mmol/L (ref 135–145)
Total Bilirubin: 0.8 mg/dL (ref 0.3–1.2)
Total Protein: 6.9 g/dL (ref 6.5–8.1)

## 2019-03-08 LAB — URINALYSIS, COMPLETE (UACMP) WITH MICROSCOPIC
Bacteria, UA: NONE SEEN
Bilirubin Urine: NEGATIVE
Glucose, UA: NEGATIVE mg/dL
Hgb urine dipstick: NEGATIVE
Ketones, ur: NEGATIVE mg/dL
Leukocytes,Ua: NEGATIVE
Nitrite: NEGATIVE
Protein, ur: NEGATIVE mg/dL
Specific Gravity, Urine: 1.024 (ref 1.005–1.030)
pH: 5 (ref 5.0–8.0)

## 2019-03-08 LAB — CBC WITH DIFFERENTIAL/PLATELET
Abs Immature Granulocytes: 0.1 10*3/uL — ABNORMAL HIGH (ref 0.00–0.07)
Basophils Absolute: 0 10*3/uL (ref 0.0–0.1)
Basophils Relative: 0 %
Eosinophils Absolute: 0.1 10*3/uL (ref 0.0–0.5)
Eosinophils Relative: 1 %
HCT: 45.6 % (ref 39.0–52.0)
Hemoglobin: 14.4 g/dL (ref 13.0–17.0)
Immature Granulocytes: 1 %
Lymphocytes Relative: 10 %
Lymphs Abs: 1.3 10*3/uL (ref 0.7–4.0)
MCH: 30.3 pg (ref 26.0–34.0)
MCHC: 31.6 g/dL (ref 30.0–36.0)
MCV: 96 fL (ref 80.0–100.0)
Monocytes Absolute: 0.6 10*3/uL (ref 0.1–1.0)
Monocytes Relative: 5 %
Neutro Abs: 10.5 10*3/uL — ABNORMAL HIGH (ref 1.7–7.7)
Neutrophils Relative %: 83 %
Platelets: 363 10*3/uL (ref 150–400)
RBC: 4.75 MIL/uL (ref 4.22–5.81)
RDW: 12.6 % (ref 11.5–15.5)
WBC: 12.6 10*3/uL — ABNORMAL HIGH (ref 4.0–10.5)
nRBC: 0 % (ref 0.0–0.2)

## 2019-03-08 LAB — LACTIC ACID, PLASMA
Lactic Acid, Venous: 2.6 mmol/L (ref 0.5–1.9)
Lactic Acid, Venous: 1.8 mmol/L (ref 0.5–1.9)
Lactic Acid, Venous: 1.4 mmol/L (ref 0.5–1.9)

## 2019-03-08 LAB — PROTIME-INR
INR: 1.2 (ref 0.8–1.2)
Prothrombin Time: 15.2 seconds (ref 11.4–15.2)

## 2019-03-08 LAB — PROCALCITONIN: Procalcitonin: <0.1 ng/mL

## 2019-03-08 MED ORDER — SODIUM CHLORIDE 0.9 % IV SOLN
2.0000 g | INTRAVENOUS | Status: DC
  Administered 2019-03-09 – 2019-03-13 (×5): 2 g via INTRAVENOUS
  Filled 2019-03-08 (×2): qty 2
  Filled 2019-03-08: qty 20
  Filled 2019-03-08: qty 2
  Filled 2019-03-08 (×3): qty 20

## 2019-03-08 MED ORDER — VANCOMYCIN HCL IN DEXTROSE 1-5 GM/200ML-% IV SOLN
1000.0000 mg | Freq: Once | INTRAVENOUS | Status: DC
  Filled 2019-03-08: qty 200

## 2019-03-08 MED ORDER — ONDANSETRON HCL 4 MG/2ML IJ SOLN: 4.0000 mg | Freq: Four times a day (QID) | INTRAMUSCULAR | Status: DC | PRN

## 2019-03-08 MED ORDER — POLYETHYLENE GLYCOL 3350 17 G PO PACK: 17.0000 g | PACK | Freq: Every day | ORAL | Status: DC | PRN

## 2019-03-08 MED ORDER — INFLUENZA VAC A&B SA ADJ QUAD 0.5 ML IM PRSY
0.5000 mL | PREFILLED_SYRINGE | INTRAMUSCULAR | Status: AC
  Administered 2019-03-10: 0.5 mL via INTRAMUSCULAR
  Filled 2019-03-08: qty 0.5

## 2019-03-08 MED ORDER — SODIUM CHLORIDE 0.9 % IV SOLN
2.0000 g | Freq: Once | INTRAVENOUS | Status: AC
  Administered 2019-03-08: 2 g via INTRAVENOUS
  Filled 2019-03-08: qty 20

## 2019-03-08 MED ORDER — ENOXAPARIN SODIUM 40 MG/0.4ML ~~LOC~~ SOLN
40.0000 mg | Freq: Two times a day (BID) | SUBCUTANEOUS | Status: DC
  Administered 2019-03-08 – 2019-03-13 (×10): 40 mg via SUBCUTANEOUS
  Filled 2019-03-08 (×11): qty 0.4

## 2019-03-08 MED ORDER — ACETAMINOPHEN 650 MG RE SUPP: 650.0000 mg | Freq: Four times a day (QID) | RECTAL | Status: DC | PRN

## 2019-03-08 MED ORDER — SODIUM CHLORIDE 0.9 % IV BOLUS
500.0000 mL | Freq: Once | INTRAVENOUS | Status: AC
  Administered 2019-03-08: 500 mL via INTRAVENOUS

## 2019-03-08 MED ORDER — ACETAMINOPHEN 325 MG PO TABS
650.0000 mg | ORAL_TABLET | Freq: Four times a day (QID) | ORAL | Status: DC | PRN
  Administered 2019-03-09 – 2019-03-13 (×13): 650 mg via ORAL
  Filled 2019-03-08 (×12): qty 2

## 2019-03-08 MED ORDER — ONDANSETRON HCL 4 MG/2ML IJ SOLN
4.0000 mg | Freq: Once | INTRAMUSCULAR | Status: AC
  Administered 2019-03-08: 4 mg via INTRAVENOUS
  Filled 2019-03-08: qty 2

## 2019-03-08 MED ORDER — MORPHINE SULFATE (PF) 2 MG/ML IV SOLN
2.0000 mg | Freq: Once | INTRAVENOUS | Status: AC
  Administered 2019-03-08: 2 mg via INTRAVENOUS
  Filled 2019-03-08: qty 1

## 2019-03-08 MED ORDER — SODIUM CHLORIDE 0.9 % IV SOLN
INTRAVENOUS | Status: DC
  Administered 2019-03-08 – 2019-03-13 (×7): via INTRAVENOUS

## 2019-03-08 MED ORDER — VANCOMYCIN HCL 1.5 G IV SOLR
1500.0000 mg | Freq: Once | INTRAVENOUS | Status: AC
  Administered 2019-03-08: 1500 mg via INTRAVENOUS
  Filled 2019-03-08: qty 1500

## 2019-03-08 MED ORDER — ONDANSETRON HCL 4 MG PO TABS: 4.0000 mg | ORAL_TABLET | Freq: Four times a day (QID) | ORAL | Status: DC | PRN

## 2019-03-08 NOTE — ED Triage Notes (Signed)
Pt sent from the wound clinic with cellulitis from foot to top of left knee. Per provider at wound clinic, pt wound has blue/green discharge as well.

## 2019-03-08 NOTE — ED Notes (Signed)
Lab called to bedside for second set of cultures

## 2019-03-08 NOTE — ED Triage Notes (Addendum)
Wife reports patient has lymphedema and cellulitis. Wound clinic sent patient here for IV antibiotics due to left leg possible infection. Patients wife reports he has been more confused lately than normal.

## 2019-03-08 NOTE — Progress Notes (Signed)
CODE SEPSIS - PHARMACY COMMUNICATION  **Broad Spectrum Antibiotics should be administered within 1 hour of Sepsis diagnosis**  Time Code Sepsis Called/Page Received: 1632  Antibiotics Ordered: ceftriaxone, vancomycin  Time of 1st antibiotic administration: 1654  Additional action taken by pharmacy: [place] Resident 03/08/2019  4:58 PM

## 2019-03-08 NOTE — ED Provider Notes (Signed)
[facility] Emergency Department Provider Note   ____________________________________________    I have reviewed the triage vital signs and the nursing notes.   HISTORY  Chief Complaint Cellulitis     HPI Steven Meza. is a 85 y.o. male with history of lymphedema who was sent from the wound care center for evaluation and treatment of left lower extremity cellulitis.  Patient reports worsening redness and pain over the last several days.  Has become difficult for him to walk because of the discomfort.  Denies fevers.  Has not take anything for this.  No nausea or vomiting.  No Covid symptoms.  Past Medical History:  Diagnosis Date  . Cellulitis   . HBP (high blood pressure)   . Hearing loss   . Lymphedema   . Sleep apnea   . Stroke [facility])     Patient Active Problem List   Diagnosis Date Noted  . Pain due to onychomycosis of toenails of both feet 10/23/2018  . Cellulitis 04/19/2018  . COPD (chronic obstructive pulmonary disease) ([place]) 04/19/2018  . Diverticulosis 04/19/2018  . History of colonic polyps 04/19/2018  . Hypertension 04/19/2018  . Morbid obesity ([place]) 04/19/2018  . Osteoarthritis 04/19/2018  . Sleep apnea 04/19/2018  . Stroke ([place]) 04/19/2018  . Tobacco abuse 04/19/2018  . Venous stasis 04/19/2018  . History of bladder cancer 02/20/2013  . Increased frequency of urination 02/20/2013  . Malignant neoplasm of other specified sites of bladder 02/20/2013  . Microscopic hematuria 02/20/2013    Past Surgical History:  Procedure Laterality Date  . BLADDER SURGERY    . KNEE SURGERY Right     Prior to Admission medications   Medication Sig Start Date End Date Taking? Authorizing Provider  aspirin 81 MG tablet Take 81 mg by mouth daily.    [provider]  doxycycline (VIBRA-TABS) 100 MG tablet Take 1 tablet (100 mg total) by mouth 2 (two) times daily. 12/27/18   Hyatt, Max T, DPM  hydrochlorothiazide (HYDRODIURIL)  25 MG tablet Take 25 mg by mouth daily. 01/16/18   [provider]  ibuprofen (ADVIL,MOTRIN) 200 MG tablet Take by mouth.    [provider]  lisinopril (PRINIVIL,ZESTRIL) 20 MG tablet Take 40 mg by mouth daily. 01/16/18   [provider]  mupirocin ointment (BACTROBAN) 2 % Apply to wound after soaking BID 12/27/18   Hyatt, Max T, DPM  omeprazole (PRILOSEC) 40 MG capsule Take by mouth. 02/09/18   [provider]  triamcinolone cream (KENALOG) 0.5 % APPLY CREAM TOPICALLY TWICE DAILY FOR UP TO 7 TO 10 DAYS 03/19/18   [provider]     Allergies Patient has no known allergies.  History reviewed. No pertinent family history.  Social History Social History   Tobacco Use  . Smoking status: Former Smoker    Quit date: 02/28/1997    Years since quitting: 22.0  . Smokeless tobacco: Never Used  Substance Use Topics  . Alcohol use: Yes    Alcohol/week: 1.0 standard drinks    Types: 1 Shots of liquor per week    Comment: OCCASIONALLY  . Drug use: No    Review of Systems  Constitutional: No fever/chills Eyes: No visual changes.  ENT: No sore throat. Cardiovascular: Denies chest pain. Respiratory: Denies shortness of breath. Gastrointestinal: No abdominal pain.  No nausea, no vomiting.   Genitourinary: Negative for dysuria. Musculoskeletal: As above Skin: As above Neurological: Negative for headaches   ____________________________________________   PHYSICAL EXAM:  VITAL SIGNS: ED Triage Vitals  Enc Vitals Group     BP 03/08/19 1606 124/71     Pulse Rate 03/08/19 1606 81     Resp 03/08/19 1606 18     Temp 03/08/19 1606 97.8 F (36.6 C)     Temp Source 03/08/19 1606 Oral     SpO2 03/08/19 1606 100 %     Weight 03/08/19 1516 115.7 kg (255 lb)     Height 03/08/19 1516 1.651 m (5\' 5" )     Head Circumference --      Peak Flow --      Pain Score 03/08/19 1516 8     Pain Loc --      Pain Edu? --      Excl. in [place]? --      Constitutional: Alert and oriented.  Nose: No congestion/rhinnorhea. Mouth/Throat: Mucous membranes are moist.   Cardiovascular: Normal rate, regular rhythm. Grossly normal heart sounds.  Good peripheral circulation. Respiratory: Normal respiratory effort.  No retractions. Lungs CTAB.  Musculoskeletal: Left lower leg: Significant erythema and weeping with swelling from lymphedema extending up almost to the knee, foot is significantly swollen as well.  No obvious source, no crepitus Neurologic:  Normal speech and language. No gross focal neurologic deficits are appreciated.  Skin:  Skin is warm, dry and intact. No rash noted. Psychiatric: Mood and affect are normal. Speech and behavior are normal.  ____________________________________________   LABS (all labs ordered are listed, but only abnormal results are displayed)  Labs Reviewed  LACTIC ACID, PLASMA - Abnormal; Notable for the following components:      Result Value   Lactic Acid, Venous 2.6 (*)    All other components within normal limits  COMPREHENSIVE METABOLIC PANEL - Abnormal; Notable for the following components:   Glucose, Bld 175 (*)    BUN 58 (*)    Creatinine, Ser 1.88 (*)    Albumin 3.1 (*)    GFR calc non Af Amer 32 (*)    GFR calc Af Amer 37 (*)    All other components within normal limits  CBC WITH DIFFERENTIAL/PLATELET - Abnormal; Notable for the following components:   WBC 12.6 (*)    Neutro Abs 10.5 (*)    Abs Immature Granulocytes 0.10 (*)    All other components within normal limits  CULTURE, BLOOD (ROUTINE X 2)  CULTURE, BLOOD (ROUTINE X 2)  SARS CORONAVIRUS 2 (TAT 6-24 HRS)  LACTIC ACID, PLASMA  URINALYSIS, COMPLETE (UACMP) WITH MICROSCOPIC   ____________________________________________  EKG  None ____________________________________________  RADIOLOGY  None ____________________________________________   PROCEDURES  Procedure(s) performed: No  Procedures   Critical Care  performed: No ____________________________________________   INITIAL IMPRESSION / ASSESSMENT AND PLAN / ED COURSE  Pertinent labs & imaging results that were available during my care of the patient were reviewed by me and considered in my medical decision making (see chart for details).  Patient presents with significant cellulitis to the left lower extremity, he is afebrile with reassuring vitals.  White blood cell count is elevated and lactic is mildly elevated as well.  Will treat with IV Rocephin, IV vancomycin he will require admission for further management    ____________________________________________   FINAL CLINICAL IMPRESSION(S) / ED DIAGNOSES  Final diagnoses:  Cellulitis of left lower extremity        Note:  This document was prepared using Dragon voice recognition software and may include unintentional dictation errors.   Lavonia Drafts, MD 03/08/19 [phone]

## 2019-03-08 NOTE — ED Notes (Signed)
MD at bedside to start [place] IV due to pt difficult stick

## 2019-03-08 NOTE — ED Notes (Signed)
Pt placed in recliner for comfort

## 2019-03-08 NOTE — ED Notes (Signed)
.. ED TO INPATIENT HANDOFF REPORT  ED Nurse Name and Phone #: Deneise Lever [phone] Name/Age/Gender Steven Meza. 85 y.o. male Room/Bed: ED34A/ED34A  Code Status   Code Status: Full Code  Home/SNF/Other Home Patient oriented to: self, place, time and situation Is this baseline? Yes   Triage Complete: Triage complete  Chief Complaint infection  Triage Note Pt sent from the wound clinic with cellulitis from foot to top of left knee. Per provider at wound clinic, pt wound has blue/green discharge as well.   Wife reports patient has lymphedema and cellulitis. Wound clinic sent patient here for IV antibiotics due to left leg possible infection. Patients wife reports he has been more confused lately than normal.    Allergies No Known Allergies  Level of Care/Admitting Diagnosis ED Disposition    ED Disposition Condition [place]: [place] [[id number]]  Level of Care: Telemetry [5]  Covid Evaluation: Asymptomatic Screening Protocol (No Symptoms)  Diagnosis: Cellulitis of left leg [id number]  Admitting Physician: Junius Argyle  Attending Physician: Annita Brod [[id number]]  Estimated length of stay: past midnight tomorrow  Certification:: I certify this patient will need inpatient services for at least 2 midnights  PT Class (Do Not Modify): Inpatient [101]  PT Acc Code (Do Not Modify): Private [1]       B Medical/Surgery History Past Medical History:  Diagnosis Date  . Cellulitis   . HBP (high blood pressure)   . Hearing loss   . Lymphedema   . Sleep apnea   . Stroke [facility])    Past Surgical History:  Procedure Laterality Date  . BLADDER SURGERY    . KNEE SURGERY Right      A IV Location/Drains/Wounds Patient Lines/Drains/Airways Status   Active Line/Drains/Airways    Name:   Placement date:   Placement time:   Site:   Days:   Peripheral IV 03/08/19 Left Antecubital   03/08/19    1634    Antecubital   less  than 1          Intake/Output Last 24 hours  Intake/Output Summary (Last 24 hours) at 03/08/2019 2128 Last data filed at 03/08/2019 2053 Gross per 24 hour  Intake 1094 ml  Output -  Net 1094 ml    Labs/Imaging Results for orders placed or performed during the hospital encounter of 03/08/19 (from the past 48 hour(s))  Urinalysis, Complete w Microscopic     Status: Abnormal   Collection Time: 03/08/19  3:19 PM  Result Value Ref Range   Color, Urine AMBER (A) YELLOW    Comment: BIOCHEMICALS MAY BE AFFECTED BY COLOR   APPearance HAZY (A) CLEAR   Specific Gravity, Urine 1.024 1.005 - 1.030   pH 5.0 5.0 - 8.0   Glucose, UA NEGATIVE NEGATIVE mg/dL   Hgb urine dipstick NEGATIVE NEGATIVE   Bilirubin Urine NEGATIVE NEGATIVE   Ketones, ur NEGATIVE NEGATIVE mg/dL   Protein, ur NEGATIVE NEGATIVE mg/dL   Nitrite NEGATIVE NEGATIVE   Leukocytes,Ua NEGATIVE NEGATIVE   RBC / HPF 0-5 0 - 5 RBC/hpf   WBC, UA 0-5 0 - 5 WBC/hpf   Bacteria, UA NONE SEEN NONE SEEN   Squamous Epithelial / LPF 0-5 0 - 5   Mucus PRESENT     Comment: Performed at [facility], [address] [place], [place] [address]  Lactic acid, plasma     Status: Abnormal   Collection Time: 03/08/19  3:23  PM  Result Value Ref Range   Lactic Acid, Venous 2.6 (HH) 0.5 - 1.9 mmol/L    Comment: CRITICAL RESULT CALLED TO, READ BACK BY AND VERIFIED WITH Martinique MOORE 03/08/19 1607 KLW Performed at [facility], [address] [place], [place] [address]   Comprehensive metabolic panel     Status: Abnormal   Collection Time: 03/08/19  3:23 PM  Result Value Ref Range   Sodium 139 135 - 145 mmol/L   Potassium 4.4 3.5 - 5.1 mmol/L   Chloride 103 98 - 111 mmol/L   CO2 24 22 - 32 mmol/L   Glucose, Bld 175 (H) 70 - 99 mg/dL   BUN 58 (H) 8 - 23 mg/dL   Creatinine, Ser 1.88 (H) 0.61 - 1.24 mg/dL   Calcium 9.1 8.9 - 10.3 mg/dL   Total Protein 6.9 6.5 - 8.1 g/dL   Albumin 3.1 (L) 3.5 - 5.0 g/dL   AST 22 15 - 41  U/L   ALT 17 0 - 44 U/L   Alkaline Phosphatase 107 38 - 126 U/L   Total Bilirubin 0.8 0.3 - 1.2 mg/dL   GFR calc non Af Amer 32 (L) >60 mL/min   GFR calc Af Amer 37 (L) >60 mL/min   Anion gap 12 5 - 15    Comment: Performed at [facility], [address] [place], [place] [address]  CBC with Differential     Status: Abnormal   Collection Time: 03/08/19  3:23 PM  Result Value Ref Range   WBC 12.6 (H) 4.0 - 10.5 K/uL   RBC 4.75 4.22 - 5.81 MIL/uL   Hemoglobin 14.4 13.0 - 17.0 g/dL   HCT 45.6 39.0 - 52.0 %   MCV 96.0 80.0 - 100.0 fL   MCH 30.3 26.0 - 34.0 pg   MCHC 31.6 30.0 - 36.0 g/dL   RDW 12.6 11.5 - 15.5 %   Platelets 363 150 - 400 K/uL   nRBC 0.0 0.0 - 0.2 %   Neutrophils Relative % 83 %   Neutro Abs 10.5 (H) 1.7 - 7.7 K/uL   Lymphocytes Relative 10 %   Lymphs Abs 1.3 0.7 - 4.0 K/uL   Monocytes Relative 5 %   Monocytes Absolute 0.6 0.1 - 1.0 K/uL   Eosinophils Relative 1 %   Eosinophils Absolute 0.1 0.0 - 0.5 K/uL   Basophils Relative 0 %   Basophils Absolute 0.0 0.0 - 0.1 K/uL   Immature Granulocytes 1 %   Abs Immature Granulocytes 0.10 (H) 0.00 - 0.07 K/uL    Comment: Performed at [facility], [address] [place], [place] [address]  Lactic acid, plasma     Status: None   Collection Time: 03/08/19  4:47 PM  Result Value Ref Range   Lactic Acid, Venous 1.8 0.5 - 1.9 mmol/L    Comment: Performed at [facility], [address] [place], [place] [address]   No results found.  Pending Labs Unresulted Labs (From admission, onward)    Start     Ordered   03/15/19 0500  Creatinine, serum  (enoxaparin (LOVENOX)    CrCl < 30 ml/min)  Weekly,   STAT    Comments: while on enoxaparin therapy.    03/08/19 1852   03/08/19 1853  CBC  (enoxaparin (LOVENOX)    CrCl < 30 ml/min)  Once,   STAT    Comments: Baseline for enoxaparin therapy IF NOT ALREADY DRAWN.  Notify MD if PLT < 100 K.  03/08/19 1852   03/08/19 1853  Hemoglobin A1c  Once,    STAT     03/08/19 1852   03/08/19 1645  SARS CORONAVIRUS 2 (TAT 6-24 HRS) Nasopharyngeal Nasopharyngeal Swab  (Asymptomatic/Tier 2 Patients Labs)  Once,   STAT    Question Answer Comment  Is this test for diagnosis or screening Screening   Symptomatic for COVID-19 as defined by CDC No   Hospitalized for COVID-19 No   Admitted to ICU for COVID-19 No   Previously tested for COVID-19 No   Resident in a congregate (group) care setting No   Employed in healthcare setting No      03/08/19 1644   03/08/19 1605  Blood culture (routine x 2)  BLOOD CULTURE X 2,   STAT     03/08/19 1604   Signed and Held  Lactic acid, plasma  STAT Now then every 2 hours,   STAT     Signed and Held   Signed and Held  Protime-INR  ONCE - STAT,   R     Signed and Held   Signed and Held  Procalcitonin  ONCE - STAT,   R     Signed and Held          Vitals/Pain Today's Vitals   03/08/19 1516 03/08/19 1606 03/08/19 1800  BP:  124/71   Pulse:  81   Resp:  18   Temp:  97.8 F (36.6 C)   TempSrc:  Oral   SpO2:  100%   Weight: 115.7 kg    Height: 5\' 5"  (1.651 m)    PainSc: 8   5     Isolation Precautions No active isolations  Medications Medications  0.9 %  sodium chloride infusion (has no administration in time range)  enoxaparin (LOVENOX) injection 40 mg (has no administration in time range)  polyethylene glycol (MIRALAX / GLYCOLAX) packet 17 g (has no administration in time range)  acetaminophen (TYLENOL) tablet 650 mg (has no administration in time range)    Or  acetaminophen (TYLENOL) suppository 650 mg (has no administration in time range)  ondansetron (ZOFRAN) tablet 4 mg (has no administration in time range)    Or  ondansetron (ZOFRAN) injection 4 mg (has no administration in time range)  vancomycin (VANCOCIN) IVPB 1000 mg/200 mL premix (1,000 mg Intravenous New Bag/Given 03/08/19 2056)  cefTRIAXone (ROCEPHIN) 2 g in sodium chloride 0.9 % 100 mL IVPB (0 g Intravenous Stopped 03/08/19 1714)   vancomycin (VANCOCIN) 1,500 mg in sodium chloride 0.9 % 500 mL IVPB (0 mg Intravenous Stopped 03/08/19 2053)  morphine 2 MG/ML injection 2 mg (2 mg Intravenous Given 03/08/19 1714)  ondansetron (ZOFRAN) injection 4 mg (4 mg Intravenous Given 03/08/19 1714)  sodium chloride 0.9 % bolus 500 mL (0 mLs Intravenous Stopped 03/08/19 1846)    Mobility walks with device High fall risk   Focused Assessments Cardiac Assessment Handoff:    No results found for: CKTOTAL, CKMB, CKMBINDEX, TROPONINI No results found for: DDIMER Does the Patient currently have chest pain? No      R Recommendations: See Admitting Provider Note  Report given to:   Additional Notes:

## 2019-03-08 NOTE — Consult Note (Signed)
PHARMACY -  BRIEF ANTIBIOTIC NOTE   Pharmacy has received consult(s) for vancomycin from an ED provider. Patient also ordered ceftriaxone. The patient's profile has been reviewed for ht/wt/allergies/indication/available labs. NKDA  Patient is an 85 y/o M who presents to [facility] ED from wound clinic with cellulitis from foot to top of left knee. Wound with blue/green discharge.  One time order(s) placed for  -vancomycin 1 g + 1.5 g (for total LD of 2.5 g)  Further antibiotics/pharmacy consults should be ordered by admitting physician if indicated.                       Thank you, Benita Gutter 03/08/2019  4:13 PM

## 2019-03-08 NOTE — H&P (Signed)
History and Physical  Steven Meza. KQ:[id number] DOB: 1930-09-22 DOA: 03/08/2019  Referring physician: Baldo Ash, ER Physician  PCP: Idelle Crouch, MD  Outpatient Specialists: Wound care center Patient coming from: Home & is able to ambulate with use of a walker or cane  Chief Complaint: Left leg swelling and pain  HPI: Steven Meza. is a 85 y.o. 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  Over the last few days, he has had increasing erythema and pain to the point where walking became very uncomfortable.  He tells me that he tried to follow the wound care recommendations at home, but it was not always able to.  Patient was seen today and sent over after there were concerns for cellulitis.   ED Course: In the emergency room, patient's leg consistent with diffuse cellulitis with an underlying ulceration over the anterior aspect of his left leg below the knee.  Labs were checked and he was found to have an elevated lactic acid level of 2.6.  Rest of his labs noteworthy for a CBG of 175, creatinine of 1.88 with a BUN of 58 and a white count of 12.6, consistent with early sepsis.  Blood cultures were drawn and patient was started on IV antibiotics.  Hospitalist were called for further evaluation.  Review of Systems: Patient seen in the emergency room. Pt complains of left leg pain and swelling.  He has chronic lower extremity swelling from both legs secondary lymphedema.  He also complains of being very hard of hearing which is chronic as well.  Patient also complains of some right hip pain which has been going on now for several months.  Pt denies any headaches, vision changes, dysphagia, chest pain, palpitations, shortness of breath, wheeze, cough, abdominal pain, hematuria, dysuria, constipation, diarrhea, nausea or vomiting.  Review of systems are  otherwise negative   Past Medical History:  Diagnosis Date  . Cellulitis   . HBP (high blood pressure)   . Hearing loss   . Lymphedema   . Sleep apnea   . Stroke [facility])    Past Surgical History:  Procedure Laterality Date  . BLADDER SURGERY    . KNEE SURGERY Right     Social History:  reports that he quit smoking about 22 years ago. He has never used smokeless tobacco. He reports current alcohol use of about 1.0 standard drinks of alcohol per week. He reports that he does not use drugs.  Lives at home with his wife.  Ambulates with use of a walker or cane   No Known Allergies  Family history: High blood pressure, diabetes  Prior to Admission medications   Medication Sig Start Date End Date Taking? Authorizing Provider  aspirin 81 MG tablet Take 81 mg by mouth daily.    [provider]  doxycycline (VIBRA-TABS) 100 MG tablet Take 1 tablet (100 mg total) by mouth 2 (two) times daily. 12/27/18   Hyatt, Max T, DPM  hydrochlorothiazide (HYDRODIURIL) 25 MG tablet Take 25 mg by mouth daily. 01/16/18   [provider]  ibuprofen (ADVIL,MOTRIN) 200 MG tablet Take by mouth.    [provider]  lisinopril (PRINIVIL,ZESTRIL) 20 MG tablet Take 40 mg by mouth daily. 01/16/18   [provider]  mupirocin ointment (BACTROBAN) 2 % Apply to wound after soaking BID 12/27/18   Hyatt, Max T, DPM  omeprazole (  PRILOSEC) 40 MG capsule Take by mouth. 02/09/18   [provider]  triamcinolone cream (KENALOG) 0.5 % APPLY CREAM TOPICALLY TWICE DAILY FOR UP TO 7 TO 10 DAYS 03/19/18   [provider]    Physical Exam: BP 124/71   Pulse 81   Temp 97.8 F (36.6 C) (Oral)   Resp 18   Ht 5\' 5"  (1.651 m)   Wt 115.7 kg   SpO2 100%   BMI 42.43 kg/m   General: Alert and oriented x3, although he is hard of hearing Eyes: Sclera nonicteric, extraocular movements are intact ENT: Normocephalic atraumatic, mucous membranes slightly dry Neck: Thick, narrow  airway Cardiovascular: Regular rate and rhythm, S1-S2 Respiratory: Clear to auscultation bilaterally Abdomen: Soft, obese, nontender, positive bowel sounds Skin: Patient's left lower extremity from the knee down notes diffuse erythema, most on anterior aspect.  Involving the majority of his leg below the knee notes a stage III ulceration Musculoskeletal: Bilateral lower extremity lymphedema, 2+ pitting Psychiatric: Appropriate, no evidence of psychoses Neurologic: Decreased sensation in his lower extremities`          Labs on Admission:  Basic Metabolic Panel: Recent Labs  Lab 03/08/19 1523  NA 139  K 4.4  CL 103  CO2 24  GLUCOSE 175*  BUN 58*  CREATININE 1.88*  CALCIUM 9.1   Liver Function Tests: Recent Labs  Lab 03/08/19 1523  AST 22  ALT 17  ALKPHOS 107  BILITOT 0.8  PROT 6.9  ALBUMIN 3.1*   No results for input(s): LIPASE, AMYLASE in the last 168 hours. No results for input(s): AMMONIA in the last 168 hours. CBC: Recent Labs  Lab 03/08/19 1523  WBC 12.6*  NEUTROABS 10.5*  HGB 14.4  HCT 45.6  MCV 96.0  PLT 363   Cardiac Enzymes: No results for input(s): CKTOTAL, CKMB, CKMBINDEX, TROPONINI in the last 168 hours.  BNP (last 3 results) No results for input(s): BNP in the last 8760 hours.  ProBNP (last 3 results) No results for input(s): PROBNP in the last 8760 hours.  CBG: No results for input(s): GLUCAP in the last 168 hours.  Radiological Exams on Admission: No results found.  EKG: Not done  Assessment/Plan Present on Admission: . Sepsis secondary to cellulitis of both lower extremities: Patient with criteria for sepsis given left leg cellulitis as the source with lactic acidosis, leukocytosis and acute kidney injury organ dysfunction.  Blood cultures pending.  Has received IV antibiotics.  Continue IV fluids and antibiotics.  Lactic acid level repeated following antibiotics and fluids down to 1.8.  Continue to monitor.  Placed on telemetry.  Marland Kitchen  COPD (chronic obstructive pulmonary disease) ([place]): Stable.  Breathing comfortably.  . Hypertension . Morbid obesity [facility]): Patient meets criteria BMI greater than 40  . Sleep apnea: Nightly CPAP  Lymphedema: Underlying contributory factor to his cellulitis.  Marland Kitchen AKI (acute kidney injury) ([place]): No previous labs to compare, but given elevated BUN, suspect he has some dysfunction due to early sepsis.  Should improve with IV fluids.  Principal Problem:   Sepsis ([place]) Active Problems:   Cellulitis of both lower extremities   COPD (chronic obstructive pulmonary disease) ([facility])   History of bladder cancer   Hypertension   Morbid obesity ([place])   Sleep apnea   Tobacco abuse   AKI (acute kidney injury) ([place])   DVT prophylaxis: Lovenox   Code Status: Full code as confirmed by patient  Family Communication: Left message with wife  Disposition Plan: Stabilize sepsis, follow  lactic acid, wound care   Consults called: None   Admission status: Given need for acute hospital services past 2 midnights, admit as inpatient     Annita Brod MD [facility] Pager [phone]  If 7PM-7AM, please contact night-coverage [email or web address] Password [facility]  03/08/2019, 5:44 PM

## 2019-03-09 LAB — BASIC METABOLIC PANEL
Anion gap: 9 (ref 5–15)
BUN: 45 mg/dL — ABNORMAL HIGH (ref 8–23)
CO2: 23 mmol/L (ref 22–32)
Calcium: 8.2 mg/dL — ABNORMAL LOW (ref 8.9–10.3)
Chloride: 105 mmol/L (ref 98–111)
Creatinine, Ser: 1.25 mg/dL — ABNORMAL HIGH (ref 0.61–1.24)
GFR calc Af Amer: >60 mL/min (ref >60)
GFR calc non Af Amer: 52 mL/min — ABNORMAL LOW (ref >60)
Glucose, Bld: 112 mg/dL — ABNORMAL HIGH (ref 70–99)
Potassium: 4.3 mmol/L (ref 3.5–5.1)
Sodium: 137 mmol/L (ref 135–145)

## 2019-03-09 LAB — HEMOGLOBIN A1C
Hgb A1c MFr Bld: 5.4 % (ref 4.8–5.6)
Mean Plasma Glucose: 108.28 mg/dL

## 2019-03-09 LAB — SARS CORONAVIRUS 2 (TAT 6-24 HRS): SARS Coronavirus 2: NEGATIVE

## 2019-03-09 LAB — LACTIC ACID, PLASMA: Lactic Acid, Venous: 1.5 mmol/L (ref 0.5–1.9)

## 2019-03-09 LAB — PSA: Prostatic Specific Antigen: 0.36 ng/mL (ref 0.00–4.00)

## 2019-03-09 MED ORDER — PNEUMOCOCCAL VAC POLYVALENT 25 MCG/0.5ML IJ INJ
0.5000 mL | INJECTION | INTRAMUSCULAR | Status: AC
  Administered 2019-03-10: 0.5 mL via INTRAMUSCULAR
  Filled 2019-03-09: qty 0.5

## 2019-03-09 MED ORDER — VANCOMYCIN HCL IN DEXTROSE 1-5 GM/200ML-% IV SOLN: 1000.0000 mg | INTRAVENOUS | Status: DC

## 2019-03-09 MED ORDER — COLLAGENASE 250 UNIT/GM EX OINT
TOPICAL_OINTMENT | Freq: Every day | CUTANEOUS | Status: DC
  Administered 2019-03-09: 14:00:00 via TOPICAL
  Administered 2019-03-10: 1 via TOPICAL
  Administered 2019-03-11 – 2019-03-12 (×3): via TOPICAL
  Filled 2019-03-09: qty 30

## 2019-03-09 MED ORDER — VANCOMYCIN HCL IN DEXTROSE 1-5 GM/200ML-% IV SOLN
1000.0000 mg | INTRAVENOUS | Status: DC
  Administered 2019-03-09 – 2019-03-12 (×4): 1000 mg via INTRAVENOUS
  Filled 2019-03-09 (×6): qty 200

## 2019-03-09 NOTE — Consult Note (Signed)
[facility] Nurse wound consult note Patient receiving care in [facility]. Reason for Consult: chronic LLE wound Wound type: related to lymphedema; is 100% yellow/brown slough tissue Measurement: encompasses the gaiter area and measures 5 cm x 25 cm Wound bed: necrotic slough Drainage (amount, consistency, odor) heavy serosanginous Periwound: erythematous, edematous, painful to touch, excoriated from the weeping. Dressing procedure/placement/frequency:  Santyl:  Apply Santyl to LLE wound in a nickel thick layer. Cover with a saline moistened gauze, then ABD pads.  Wrap in [place] and an ACE wrap.  Change daily.  Elevate the legs above heart level as much as possible.  Patient currently sitting in recliner with legs in dependent position.  Primary RN states the patient is refusing to elevate his legs while in the recliner. Monitor the wound area(s) for worsening of condition such as: Signs/symptoms of infection,  Increase in size,  Development of or worsening of odor, Development of pain, or increased pain at the affected locations.  Notify the medical team if any of these develop.  Thank you for the consult.  Discussed plan of care with the patient and bedside nurse.  [place] nurse will not follow at this time.  Please re-consult the [place] team if needed.  Val Riles, RN, MSN, CWOCN, CNS-BC, pager [phone]

## 2019-03-09 NOTE — Progress Notes (Signed)
PROGRESS NOTE  Steven Meza. 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  Patient treated with IV fluids and antibiotics.  After several hours, lactic acid level normalized.  Admitted to the hospitalist service.  This morning, patient feeling better.  He is having less pain.  Left lower extremity notes decreased erythema with improvement of white blood cell count and renal function.  Assessment/Plan: Sepsis secondary to cellulitis of both lower extremities: Patient with criteria for sepsis given left leg cellulitis as the source with lactic acidosis, leukocytosis and acute kidney injury organ dysfunction.  Blood cultures pending.    Sepsis has now resolved following IV antibiotics and fluids, lactic acid level normalized.  Continue current plan.    Wound care with following recommendations:Apply Santyl to LLE wound in a nickel thick layer. Cover with a saline moistened gauze, then ABD pads.  Wrap in [place] and an ACE wrap.  Change daily.  Marland Kitchen COPD (chronic obstructive pulmonary disease) ([place]): Stable.  Breathing comfortably.  . Hypertension . Morbid obesity [facility]): Patient meets criteria BMI greater than 40  . Sleep apnea: Nightly CPAP  Lymphedema: Underlying contributory factor to his cellulitis.  Marland Kitchen AKI (acute kidney injury) ([place]): Secondary to dehydration for sepsis.  Improved with IV fluids.  Code Status: Full code  Family Communication: Left message for wife  Disposition Plan: Home in the next few days, once cellulitis further improved and can switch to p.o.  antibiotics   Consultants:  None  Procedures:  None  Antimicrobials:  IV vancomycin 11/5-present  DVT prophylaxis: Lovenox   Objective: Vitals:   03/09/19 0451 03/09/19 0749  BP: (!) 105/49 (!) 125/50  Pulse: (!) 102 97  Resp: 20 19  Temp: 98.6 F (37 C) 98 F (36.7 C)  SpO2: 96% 98%    Intake/Output Summary (Last 24 hours) at 03/09/2019 1422 Last data filed at 03/09/2019 1247 Gross per 24 hour  Intake 1334 ml  Output 350 ml  Net 984 ml   Filed Weights   03/08/19 1516 03/08/19 2233  Weight: 115.7 kg 111.9 kg   Body mass index is 39.8 kg/m.  Exam:   General: Alert and oriented x3, no acute distress, chronically hard of hearing  HEENT: Normocephalic and atraumatic, mucous membrane slightly dry  Cardiovascular: Regular rate and rhythm, S1-S2  Respiratory: Clear auscultation bilaterally  Abdomen: Abdomen soft, nontender, nondistended, positive bowel sounds  Musculoskeletal: No clubbing or cyanosis, 2+ pitting edema from the knees down bilaterally secondary lymphedema  Skin: Left lower extremity notes decreased erythema in the anterior aspect of the left leg extending down to the foot with continued stage II/III ulceration  Psychiatry: Appropriate, no evidence of psychoses   Data Reviewed: CBC: Recent Labs  Lab 03/08/19 1523  WBC 12.6*  NEUTROABS 10.5*  HGB 14.4  HCT 45.6  MCV 96.0  PLT [id number]   Basic Metabolic Panel: Recent Labs  Lab 03/08/19 1523 03/09/19 0534  NA 139 137  K 4.4 4.3  CL 103 105  CO2 24 23  GLUCOSE 175* 112*  BUN 58* 45*  CREATININE 1.88* 1.25*  CALCIUM 9.1 8.2*   GFR: Estimated Creatinine Clearance:  49.8 mL/min (A) (by C-G formula based on SCr of 1.25 mg/dL (H)). Liver Function Tests: Recent Labs  Lab 03/08/19 1523  AST 22  ALT 17  ALKPHOS 107  BILITOT 0.8  PROT 6.9  ALBUMIN 3.1*   No results for input(s): LIPASE, AMYLASE in the last 168 hours. No results for input(s): AMMONIA in the last 168  hours. Coagulation Profile: Recent Labs  Lab 03/08/19 2244  INR 1.2   Cardiac Enzymes: No results for input(s): CKTOTAL, CKMB, CKMBINDEX, TROPONINI in the last 168 hours. BNP (last 3 results) No results for input(s): PROBNP in the last 8760 hours. HbA1C: Recent Labs    03/08/19 1523  HGBA1C 5.4   CBG: No results for input(s): GLUCAP in the last 168 hours. Lipid Profile: No results for input(s): CHOL, HDL, LDLCALC, TRIG, CHOLHDL, LDLDIRECT in the last 72 hours. Thyroid Function Tests: No results for input(s): TSH, T4TOTAL, FREET4, T3FREE, THYROIDAB in the last 72 hours. Anemia Panel: No results for input(s): VITAMINB12, FOLATE, FERRITIN, TIBC, IRON, RETICCTPCT in the last 72 hours. Urine analysis:    Component Value Date/Time   COLORURINE AMBER (A) 03/08/2019 1519   APPEARANCEUR HAZY (A) 03/08/2019 1519   LABSPEC 1.024 03/08/2019 1519   PHURINE 5.0 03/08/2019 1519   GLUCOSEU NEGATIVE 03/08/2019 1519   HGBUR NEGATIVE 03/08/2019 1519   BILIRUBINUR NEGATIVE 03/08/2019 1519   KETONESUR NEGATIVE 03/08/2019 1519   PROTEINUR NEGATIVE 03/08/2019 1519   NITRITE NEGATIVE 03/08/2019 1519   LEUKOCYTESUR NEGATIVE 03/08/2019 1519   Sepsis Labs: @LABRCNTIP (procalcitonin:4,lacticidven:4)  ) Recent Results (from the past 240 hour(s))  SARS CORONAVIRUS 2 (TAT 6-24 HRS) Nasopharyngeal Nasopharyngeal Swab     Status: None   Collection Time: 03/08/19  4:45 PM   Specimen: Nasopharyngeal Swab  Result Value Ref Range Status   SARS Coronavirus 2 NEGATIVE NEGATIVE Final    Comment: (NOTE) SARS-CoV-2 target nucleic acids are NOT DETECTED. The SARS-CoV-2 RNA is generally detectable in upper and lower respiratory specimens during the acute phase of infection. Negative results do not preclude SARS-CoV-2 infection, do not rule out co-infections with other pathogens, and should not be used as the sole basis for treatment or other patient management decisions. Negative results must be combined  with clinical observations, patient history, and epidemiological information. The expected result is Negative. Fact Sheet for Patients: SugarRoll.be Fact Sheet for Healthcare Providers: https://www.woods-mathews.com/ This test is not yet approved or cleared by the [place] FDA and  has been authorized for detection and/or diagnosis of SARS-CoV-2 by FDA under an Emergency Use Authorization (EUA). This EUA will remain  in effect (meaning this test can be used) for the duration of the COVID-19 declaration under Section 56 4(b)(1) of the Act, 21 U.S.C. section 360bbb-3(b)(1), unless the authorization is terminated or revoked sooner. Performed at [facility], [place] [address] [place], [place] [address]       Studies: No results found.  Scheduled Meds: . collagenase   Topical Daily  . enoxaparin (LOVENOX) injection  40 mg Subcutaneous BID  . influenza vaccine adjuvanted  0.5 mL Intramuscular Tomorrow-1000  . [START ON 03/10/2019] pneumococcal 23 valent vaccine  0.5 mL Intramuscular Tomorrow-1000    Continuous Infusions: . sodium chloride 75 mL/hr at 03/09/19 1035  . cefTRIAXone (ROCEPHIN)  IV 2 g (03/09/19 1154)  . vancomycin       LOS: 1 day     Annita Brod, MD [facility]  To reach me or the doctor on call, go to: [email or web address] Password  [email or web address]  03/09/2019, 2:22 PM

## 2019-03-09 NOTE — Plan of Care (Signed)

## 2019-03-09 NOTE — Progress Notes (Signed)
Pharmacy Antibiotic Note  Steven Meza. is a 85 y.o. male admitted on 03/08/2019 with cellulitis.  Pharmacy has been consulted for vanc/ceftriaxone dosing.  Plan: Patient received vanc 1.5g IV load in ED  Will change to: Vancomycin 1000 mg IV Q 24 hrs. Goal AUC 400-550. Expected AUC: 494.0 SCr used: 1.25 Cssmin: 13.4  Will f/u on am labs to reassess renal function since patient appears to be in AKI (resolving)  Scr 1.88 on admission, now 1.25  Height: 5\' 6"  (167.6 cm) Weight: 246 lb 9.6 oz (111.9 kg) IBW/kg (Calculated) : 63.8  Temp (24hrs), Avg:98.1 F (36.7 C), Min:97.8 F (36.6 C), Max:98.6 F (37 C)  Recent Labs  Lab 03/08/19 1523 03/08/19 1647 03/08/19 2244 03/09/19 0035 03/09/19 0534  WBC 12.6*  --   --   --   --   CREATININE 1.88*  --   --   --  1.25*  LATICACIDVEN 2.6* 1.8 1.4 1.5  --     Estimated Creatinine Clearance: 49.8 mL/min (A) (by C-G formula based on SCr of 1.25 mg/dL (H)).    No Known Allergies  Thank you for allowing pharmacy to be a part of this patient's care.  Lu Duffel, PharmD, BCPS Clinical Pharmacist 03/09/2019 7:17 AM

## 2019-03-09 NOTE — Progress Notes (Signed)
Pharmacy Antibiotic Note  Steven Meza. is a 85 y.o. male admitted on 03/08/2019 with cellulitis.  Pharmacy has been consulted for vanc/ceftriaxone dosing.  Plan: Patient received vanc 1.5g IV load in ED Vancomycin 1000 mg IV Q 36 hrs. Goal AUC 400-550. Expected AUC: 467.0 SCr used: 1.88 Cssmin: 12.2  Will f/u on am labs to reassess renal function since patient appears to be in AKI  Height: 5\' 6"  (167.6 cm) Weight: 246 lb 9.6 oz (111.9 kg) IBW/kg (Calculated) : 63.8  Temp (24hrs), Avg:97.9 F (36.6 C), Min:97.8 F (36.6 C), Max:98 F (36.7 C)  Recent Labs  Lab 03/08/19 1523 03/08/19 1647 03/08/19 2244 03/09/19 0035  WBC 12.6*  --   --   --   CREATININE 1.88*  --   --   --   LATICACIDVEN 2.6* 1.8 1.4 1.5    Estimated Creatinine Clearance: 33.1 mL/min (A) (by C-G formula based on SCr of 1.88 mg/dL (H)).    No Known Allergies  Thank you for allowing pharmacy to be a part of this patient's care.  Tobie Lords, PharmD, BCPS Clinical Pharmacist 03/09/2019 4:37 AM

## 2019-03-10 LAB — CBC
HCT: 41.4 % (ref 39.0–52.0)
Hemoglobin: 13.7 g/dL (ref 13.0–17.0)
MCH: 29.9 pg (ref 26.0–34.0)
MCHC: 33.1 g/dL (ref 30.0–36.0)
MCV: 90.4 fL (ref 80.0–100.0)
Platelets: 330 10*3/uL (ref 150–400)
RBC: 4.58 MIL/uL (ref 4.22–5.81)
RDW: 12.5 % (ref 11.5–15.5)
WBC: 10.4 10*3/uL (ref 4.0–10.5)
nRBC: 0 % (ref 0.0–0.2)

## 2019-03-10 LAB — BASIC METABOLIC PANEL
Anion gap: 8 (ref 5–15)
BUN: 33 mg/dL — ABNORMAL HIGH (ref 8–23)
CO2: 25 mmol/L (ref 22–32)
Calcium: 8.4 mg/dL — ABNORMAL LOW (ref 8.9–10.3)
Chloride: 102 mmol/L (ref 98–111)
Creatinine, Ser: 1.16 mg/dL (ref 0.61–1.24)
GFR calc Af Amer: >60 mL/min (ref >60)
GFR calc non Af Amer: 57 mL/min — ABNORMAL LOW (ref >60)
Glucose, Bld: 103 mg/dL — ABNORMAL HIGH (ref 70–99)
Potassium: 4.4 mmol/L (ref 3.5–5.1)
Sodium: 135 mmol/L (ref 135–145)

## 2019-03-10 LAB — LACTIC ACID, PLASMA: Lactic Acid, Venous: 1.2 mmol/L (ref 0.5–1.9)

## 2019-03-10 NOTE — Consult Note (Signed)
SURGICAL CONSULTATION NOTE   HISTORY OF PRESENT ILLNESS (HPI):  85 y.o. male presented to [facility] ED for evaluation of left leg pain and swelling. Patient reports having chronic left leg pain and swelling for many months.  He has been dealing with lower extremity ulcers for a long time.the patient reported the pain is chronic.  Pain radiates through all his legs.  Aggravating factor is applying pressure and moving the leg.  There is no alleviating factor identified.  Patient has been evaluated by wound care centers.  I reviewed the chart and as per the wound care center he has been evaluated by vascular surgery but no intervention has been recommended. Denies fever chills.  Surgery is consulted by Dr. [place] Pink in this context for evaluation and management of chronic lower extremity lymphedema with ulcers.  PAST MEDICAL HISTORY (PMH):  Past Medical History:  Diagnosis Date  . Cellulitis   . HBP (high blood pressure)   . Hearing loss   . Lymphedema   . Sleep apnea   . Stroke [facility])      PAST SURGICAL HISTORY ([place]):  Past Surgical History:  Procedure Laterality Date  . BLADDER SURGERY    . KNEE SURGERY Right      MEDICATIONS:  Prior to Admission medications   Medication Sig Start Date End Date Taking? Authorizing Provider  aspirin 81 MG tablet Take 81 mg by mouth daily.   Yes [provider]  hydrochlorothiazide (HYDRODIURIL) 25 MG tablet Take 25 mg by mouth daily. 01/16/18  Yes [provider]  lisinopril (PRINIVIL,ZESTRIL) 20 MG tablet Take 40 mg by mouth daily. 01/16/18  Yes [provider]  omeprazole (PRILOSEC) 40 MG capsule Take 40 mg by mouth daily.    Yes [provider]  doxycycline (VIBRA-TABS) 100 MG tablet Take 1 tablet (100 mg total) by mouth 2 (two) times daily. Patient not taking: Reported on 03/08/2019 12/27/18   Tyson Dense T, DPM  mupirocin ointment Drue Stager) 2 % Apply to wound after soaking BID Patient not taking: Reported on 03/08/2019  12/27/18   Garrel Ridgel, DPM     ALLERGIES:  No Known Allergies   SOCIAL HISTORY:  Social History   Socioeconomic History  . Marital status: Married    Spouse name: Not on file  . Number of children: Not on file  . Years of education: Not on file  . Highest education level: Not on file  Occupational History  . Not on file  Social Needs  . Financial resource strain: Not on file  . Food insecurity    Worry: Not on file    Inability: Not on file  . Transportation needs    Medical: Not on file    Non-medical: Not on file  Tobacco Use  . Smoking status: Former Smoker    Quit date: 02/28/1997    Years since quitting: 22.0  . Smokeless tobacco: Never Used  Substance and Sexual Activity  . Alcohol use: Yes    Alcohol/week: 1.0 standard drinks    Types: 1 Shots of liquor per week    Comment: OCCASIONALLY  . Drug use: No  . Sexual activity: Not on file  Lifestyle  . Physical activity    Days per week: Not on file    Minutes per session: Not on file  . Stress: Not on file  Relationships  . Social Herbalist on phone: Not on file    Gets together: Not on file    Attends  religious service: Not on file    Active member of club or organization: Not on file    Attends meetings of clubs or organizations: Not on file    Relationship status: Not on file  . Intimate partner violence    Fear of current or ex partner: Not on file    Emotionally abused: Not on file    Physically abused: Not on file    Forced sexual activity: Not on file  Other Topics Concern  . Not on file  Social History Narrative  . Not on file    The patient currently resides (home / rehab facility / nursing home): Home The patient normally is (ambulatory / bedbound): Ambulatory   FAMILY HISTORY:  History reviewed. No pertinent family history.   REVIEW OF SYSTEMS:  Constitutional: denies weight loss, fever, chills, or sweats  Eyes: denies any other vision changes, history of eye injury  ENT:  denies sore throat, hearing problems  Respiratory: denies shortness of breath, wheezing  Cardiovascular: denies chest pain, palpitations  Gastrointestinal: denies abdominal pain, N/V, or diarrhea/and bowel function as per HPI Genitourinary: denies burning with urination or urinary frequency Musculoskeletal: Positive any other joint pains or cramps  Skin: denies any other rashes or skin discolorations  Neurological: denies any other headache, dizziness, weakness  Psychiatric: denies any other depression, anxiety   All other review of systems were negative   VITAL SIGNS:  Temp:  [98.2 F (36.8 C)-98.8 F (37.1 C)] 98.8 F (37.1 C) (11/07 0818) Pulse Rate:  [79-88] 79 (11/07 0818) Resp:  [18] 18 (11/07 0818) BP: (116-134)/(45-67) 130/67 (11/07 0818) SpO2:  [96 %-99 %] 98 % (11/07 0818)     Height: 5\' 6"  (167.6 cm) Weight: 111.9 kg BMI (Calculated): 39.82   INTAKE/OUTPUT:  This shift: Total I/O In: -  Out: 200 [Urine:200]  Last 2 shifts: @IOLAST2SHIFTS @   PHYSICAL EXAM:  Constitutional:  -- Normal body habitus  -- Awake, alert, and oriented x3  Eyes:  -- Pupils equally round and reactive to light  -- No scleral icterus  Ear, nose, and throat:  -- No jugular venous distension  Pulmonary:  -- No crackles  -- Equal breath sounds bilaterally -- Breathing non-labored at rest Cardiovascular:  -- S1, S2 present  -- No pericardial rubs Gastrointestinal:  -- Abdomen soft, nontender, non-distended, no guarding or rebound tenderness -- No abdominal masses appreciated, pulsatile or otherwise  Musculoskeletal and Integumentary:  -- Wounds or skin discoloration: Chronic redness of the left lower extremity.  Multiple openings of the skin.  No necrotic tissue.  No purulent drainage. -- Extremities: Bilateral lower extremity edema +3 Neurologic:  -- Motor function: intact and symmetric -- Sensation: Decreased in bilateral foot.      Labs:  CBC Latest Ref Rng & Units 03/10/2019  03/08/2019  WBC 4.0 - 10.5 K/uL 10.4 12.6(H)  Hemoglobin 13.0 - 17.0 g/dL 13.7 14.4  Hematocrit 39.0 - 52.0 % 41.4 45.6  Platelets 150 - 400 K/uL 330 363   CMP Latest Ref Rng & Units 03/10/2019 03/09/2019 03/08/2019  Glucose 70 - 99 mg/dL 103(H) 112(H) 175(H)  BUN 8 - 23 mg/dL 33(H) 45(H) 58(H)  Creatinine 0.61 - 1.24 mg/dL 1.16 1.25(H) 1.88(H)  Sodium 135 - 145 mmol/L 135 137 139  Potassium 3.5 - 5.1 mmol/L 4.4 4.3 4.4  Chloride 98 - 111 mmol/L 102 105 103  CO2 22 - 32 mmol/L 25 23 24   Calcium 8.9 - 10.3 mg/dL 8.4(L) 8.2(L) 9.1  Total Protein 6.5 -  8.1 g/dL - - 6.9  Total Bilirubin 0.3 - 1.2 mg/dL - - 0.8  Alkaline Phos 38 - 126 U/L - - 107  AST 15 - 41 U/L - - 22  ALT 0 - 44 U/L - - 17     Imaging studies:  I evaluated the images of arterial lower extremity duplex and ABI done in January 2020.  Patient with moderate to severe arterial disease of bilateral lower extremities.  Assessment/Plan:  85 y.o. male with chronic ulceration due to combination of lower extremity lymphedema, venous stasis and arterial disease, complicated by pertinent comorbidities including hypertension, sleep apnea, obesity, lymphedema, prostate cancer. She has chronic lower extremity (left) skin openings due to the severity of the edema.  I considered that this patient has CEAP 6 due to active ulceration.  The main reason that the patient does not respond to local care is because the swelling has not been able to be controlled.  Patient is currently receiving local care with Santyl collagenase.  I consider that but would be most beneficial what is patient is a Unna's boot dressings with zink oxide cream and calamine.  This dressing needs to be applied with certain pressure.  Due to the severity of this patient arterial, venous and lymphatic disease and seen at this recommendation she will come better from a vascular specialist.  From a general surgery standpoint there is no need for debridement since there is no  necrotic tissue.  I agree with current management.  I do recommend to change the current local care to the Unna's boot when available.  I think that this is not available during the weekend.  Arnold Long, MD

## 2019-03-10 NOTE — Progress Notes (Signed)
PROGRESS NOTE  Steven Meza. [id number] DOB: Oct 09, 1930 DOA: 03/08/2019 PCP: Idelle Crouch, MD  HPI/Recap of past 19 hours: 85 year old male with past medical history of lymphedema, hypertension and sleep apnea been followed by the wound care center for chronic ulceration of his left leg admitted on 11/5 after coming in with several days of leg pain and swelling and found to have left lower extremity cellulitis and early signs of sepsis.  Patient treated with IV fluids and antibiotics.  After several hours, lactic acid level normalized.  Admitted to the hospitalist service.  Today, patient continues to have less pain in his leg.  Leg itself slowly improving with less erythema and improving white count plus renal function.  Seen by general surgery to see if wound needs debridement, it does not.  General surgery recommended Unnaboot and pressure dressings accordingly.   Assessment/Plan: Sepsis secondary to cellulitis of both lower extremities: Patient with criteria for sepsis given left leg cellulitis as the source with lactic acidosis, leukocytosis and acute kidney injury organ dysfunction.  Blood cultures pending.    Sepsis has now resolved following IV antibiotics and fluids, lactic acid level normalized.  Continue antibiotics and wound care as below.  Wound care with following recommendations:Apply Santyl to LLE wound in a nickel thick layer. Cover with a saline moistened gauze, then ABD pads.  Wrap in [place] and an ACE wrap.  Change daily.  Marland Kitchen COPD (chronic obstructive pulmonary disease) ([place]): Stable.  Breathing comfortably.  . Hypertension: Stable.  . Morbid obesity [facility]): Patient meets criteria BMI greater than 40  . Sleep apnea: Nightly CPAP  Lymphedema: Underlying contributory factor to his cellulitis.  Marland Kitchen AKI (acute kidney injury) ([place]): Secondary to dehydration for sepsis.  Improved with IV fluids.  Almost back to baseline  Code Status: Full code  Family  Communication: Left message for wife  Disposition Plan: Home potentially tomorrow or the next day as wound continues to improve.   Consultants:  None  Procedures:  None  Antimicrobials:  IV vancomycin 11/5-present  DVT prophylaxis: Lovenox   Objective: Vitals:   03/10/19 0400 03/10/19 0818  BP: (!) 134/45 130/67  Pulse: 88 79  Resp:  18  Temp: 98.8 F (37.1 C) 98.8 F (37.1 C)  SpO2: 96% 98%    Intake/Output Summary (Last 24 hours) at 03/10/2019 1448 Last data filed at 03/10/2019 0934 Gross per 24 hour  Intake 1259.79 ml  Output 600 ml  Net 659.79 ml   Filed Weights   03/08/19 1516 03/08/19 2233  Weight: 115.7 kg 111.9 kg   Body mass index is 39.8 kg/m.  Exam:   General: Alert and oriented x3, no acute distress, chronically hard of hearing  HEENT: Normocephalic and atraumatic, mucous membrane slightly dry  Cardiovascular: Regular rate and rhythm, S1-S2  Respiratory: Clear auscultation bilaterally  Abdomen: Abdomen soft, nontender, nondistended, positive bowel sounds  Musculoskeletal: No clubbing or cyanosis, 2+ pitting edema from the knees down bilaterally secondary lymphedema.  Left lower extremity gauze wrap followed by Ace bandage.  Skin: Left leg covered by bandage  Psychiatry: Appropriate, no evidence of psychoses   Data Reviewed: CBC: Recent Labs  Lab 03/08/19 1523 03/10/19 0450  WBC 12.6* 10.4  NEUTROABS 10.5*  --   HGB 14.4 13.7  HCT 45.6 41.4  MCV 96.0 90.4  PLT 363 [id number]   Basic Metabolic Panel: Recent Labs  Lab 03/08/19 1523 03/09/19 0534 03/10/19 0450  NA 139 137 135  K 4.4 4.3 4.4  CL 103 105 102  CO2 24 23 25   GLUCOSE 175* 112* 103*  BUN 58* 45* 33*  CREATININE 1.88* 1.25* 1.16  CALCIUM 9.1 8.2* 8.4*   GFR: Estimated Creatinine Clearance: 53.7 mL/min (by C-G formula based on SCr of 1.16 mg/dL). Liver Function Tests: Recent Labs  Lab 03/08/19 1523  AST 22  ALT 17  ALKPHOS 107  BILITOT 0.8  PROT 6.9   ALBUMIN 3.1*   No results for input(s): LIPASE, AMYLASE in the last 168 hours. No results for input(s): AMMONIA in the last 168 hours. Coagulation Profile: Recent Labs  Lab 03/08/19 2244  INR 1.2   Cardiac Enzymes: No results for input(s): CKTOTAL, CKMB, CKMBINDEX, TROPONINI in the last 168 hours. BNP (last 3 results) No results for input(s): PROBNP in the last 8760 hours. HbA1C: Recent Labs    03/08/19 1523  HGBA1C 5.4   CBG: No results for input(s): GLUCAP in the last 168 hours. Lipid Profile: No results for input(s): CHOL, HDL, LDLCALC, TRIG, CHOLHDL, LDLDIRECT in the last 72 hours. Thyroid Function Tests: No results for input(s): TSH, T4TOTAL, FREET4, T3FREE, THYROIDAB in the last 72 hours. Anemia Panel: No results for input(s): VITAMINB12, FOLATE, FERRITIN, TIBC, IRON, RETICCTPCT in the last 72 hours. Urine analysis:    Component Value Date/Time   COLORURINE AMBER (A) 03/08/2019 1519   APPEARANCEUR HAZY (A) 03/08/2019 1519   LABSPEC 1.024 03/08/2019 1519   PHURINE 5.0 03/08/2019 1519   GLUCOSEU NEGATIVE 03/08/2019 1519   HGBUR NEGATIVE 03/08/2019 1519   BILIRUBINUR NEGATIVE 03/08/2019 1519   KETONESUR NEGATIVE 03/08/2019 1519   PROTEINUR NEGATIVE 03/08/2019 1519   NITRITE NEGATIVE 03/08/2019 1519   LEUKOCYTESUR NEGATIVE 03/08/2019 1519   Sepsis Labs: @LABRCNTIP (procalcitonin:4,lacticidven:4)  ) Recent Results (from the past 240 hour(s))  SARS CORONAVIRUS 2 (TAT 6-24 HRS) Nasopharyngeal Nasopharyngeal Swab     Status: None   Collection Time: 03/08/19  4:45 PM   Specimen: Nasopharyngeal Swab  Result Value Ref Range Status   SARS Coronavirus 2 NEGATIVE NEGATIVE Final    Comment: (NOTE) SARS-CoV-2 target nucleic acids are NOT DETECTED. The SARS-CoV-2 RNA is generally detectable in upper and lower respiratory specimens during the acute phase of infection. Negative results do not preclude SARS-CoV-2 infection, do not rule out co-infections with other  pathogens, and should not be used as the sole basis for treatment or other patient management decisions. Negative results must be combined with clinical observations, patient history, and epidemiological information. The expected result is Negative. Fact Sheet for Patients: SugarRoll.be Fact Sheet for Healthcare Providers: https://www.woods-mathews.com/ This test is not yet approved or cleared by the [place] FDA and  has been authorized for detection and/or diagnosis of SARS-CoV-2 by FDA under an Emergency Use Authorization (EUA). This EUA will remain  in effect (meaning this test can be used) for the duration of the COVID-19 declaration under Section 56 4(b)(1) of the Act, 21 U.S.C. section 360bbb-3(b)(1), unless the authorization is terminated or revoked sooner. Performed at [facility], [place] [address] [place], [place] [address]       Studies: No results found.  Scheduled Meds: . collagenase   Topical Daily  . enoxaparin (LOVENOX) injection  40 mg Subcutaneous BID    Continuous Infusions: . sodium chloride 75 mL/hr at 03/10/19 1018  . cefTRIAXone (ROCEPHIN)  IV 2 g (03/10/19 1024)  . vancomycin 200 mL/hr at 03/09/19 1814     LOS: 2 days     Annita Brod, MD [facility]  To reach me or the doctor on call, go to: [email or web address] Password [facility]  03/10/2019, 2:48 PM

## 2019-03-11 LAB — BASIC METABOLIC PANEL
Anion gap: 7 (ref 5–15)
BUN: 20 mg/dL (ref 8–23)
CO2: 27 mmol/L (ref 22–32)
Calcium: 8.4 mg/dL — ABNORMAL LOW (ref 8.9–10.3)
Chloride: 103 mmol/L (ref 98–111)
Creatinine, Ser: 0.91 mg/dL (ref 0.61–1.24)
GFR calc Af Amer: >60 mL/min (ref >60)
GFR calc non Af Amer: >60 mL/min (ref >60)
Glucose, Bld: 94 mg/dL (ref 70–99)
Potassium: 4.2 mmol/L (ref 3.5–5.1)
Sodium: 137 mmol/L (ref 135–145)

## 2019-03-11 LAB — MAGNESIUM: Magnesium: 2 mg/dL (ref 1.7–2.4)

## 2019-03-11 LAB — GLUCOSE, CAPILLARY: Glucose-Capillary: 135 mg/dL — ABNORMAL HIGH (ref 70–99)

## 2019-03-11 NOTE — Progress Notes (Addendum)
CCMD called and reported that pt just have 17 beats on non-sustain vtach. On assessment pt was not on any distress. VSS.  Notify prime. Will continue to monitor.  Update 0456: Ouma NP states will place order for Mag level. Will continue to monitor.

## 2019-03-11 NOTE — Plan of Care (Signed)
  Problem: Education: Goal: Knowledge of General Education information will improve Description: Including pain rating scale, medication(s)/side effects and non-pharmacologic comfort measures Outcome: Progressing   Problem: Clinical Measurements: Goal: Will remain free from infection Outcome: Progressing   Problem: Safety: Goal: Ability to remain free from injury will improve Outcome: Progressing   

## 2019-03-11 NOTE — Progress Notes (Signed)
PROGRESS NOTE  Steven Meza. [id number] DOB: 08/20/1930 DOA: 03/08/2019 PCP: Idelle Crouch, MD  HPI/Recap of past 65 hours: 85 year old male with past medical history of lymphedema, hypertension and sleep apnea been followed by the wound care center for chronic ulceration of his left leg admitted on 11/5 after coming in with several days of leg pain and swelling and found to have left lower extremity cellulitis and early signs of sepsis.  Patient treated with IV fluids and antibiotics.  After several hours, lactic acid level normalized.  Admitted to the hospitalist service.  patient continues to have less pain in his leg.  Leg itself slowly improving with less erythema and improving white count plus renal function.  Seen by general surgery to see if wound needs debridement, it does not.  General surgery recommended Unnaboot and pressure dressings accordingly.  Patient starting to feel somewhat better, still moderate amount of pain.   Assessment/Plan: Sepsis secondary to cellulitis of both lower extremities: Patient with criteria for sepsis given left leg cellulitis as the source with lactic acidosis, leukocytosis and acute kidney injury organ dysfunction.  Blood cultures pending.    Sepsis has now resolved following IV antibiotics and fluids, lactic acid level normalized.  Continue antibiotics and wound care as below.  Wound care with following recommendations:Apply Santyl to LLE wound in a nickel thick layer. Cover with a saline moistened gauze, then ABD pads.  Wrap in [place] and an ACE wrap.  Change daily.  We will set up home health  . COPD (chronic obstructive pulmonary disease) ([place]): Stable.  Breathing comfortably.  . Hypertension: Stable.  . Morbid obesity [facility]): Patient meets criteria BMI greater than 40  . Sleep apnea: Nightly CPAP  Lymphedema: Underlying contributory factor to his cellulitis.  Marland Kitchen AKI (acute kidney injury) ([place]): Secondary to dehydration for  sepsis.  Improved with IV fluids.  Normalized as of 11/8  Code Status: Full code  Family Communication: Left message for wife  Disposition Plan: Home tomorrow with home health   Consultants:  None  Procedures:  None  Antimicrobials:  IV vancomycin 11/5-present  DVT prophylaxis: Lovenox   Objective: Vitals:   03/11/19 0604 03/11/19 0803  BP: (!) 139/59 134/65  Pulse: 83 86  Resp: 20 14  Temp: 98.4 F (36.9 C) 98 F (36.7 C)  SpO2: 98% 92%    Intake/Output Summary (Last 24 hours) at 03/11/2019 1245 Last data filed at 03/11/2019 0900 Gross per 24 hour  Intake 3563.64 ml  Output 390 ml  Net 3173.64 ml   Filed Weights   03/08/19 1516 03/08/19 2233 03/11/19 0604  Weight: 115.7 kg 111.9 kg 113.6 kg   Body mass index is 40.42 kg/m.  Exam:   General: Alert and oriented x3, no acute distress, chronically hard of hearing  HEENT: Normocephalic and atraumatic, mucous membrane slightly dry  Cardiovascular: Regular rate and rhythm, S1-S2  Respiratory: Clear auscultation bilaterally  Abdomen: Abdomen soft, nontender, nondistended, positive bowel sounds  Musculoskeletal: No clubbing or cyanosis, 2+ pitting edema from the knees down bilaterally secondary lymphedema.  Left lower extremity gauze wrap followed by Ace bandage.  Skin: Left leg covered by bandage  Psychiatry: Appropriate, no evidence of psychoses   Data Reviewed: CBC: Recent Labs  Lab 03/08/19 1523 03/10/19 0450  WBC 12.6* 10.4  NEUTROABS 10.5*  --   HGB 14.4 13.7  HCT 45.6 41.4  MCV 96.0 90.4  PLT 363 [id number]   Basic Metabolic Panel: Recent Labs  Lab 03/08/19 1523  03/09/19 0534 03/10/19 0450 03/11/19 0451  NA 139 137 135 137  K 4.4 4.3 4.4 4.2  CL 103 105 102 103  CO2 24 23 25 27   GLUCOSE 175* 112* 103* 94  BUN 58* 45* 33* 20  CREATININE 1.88* 1.25* 1.16 0.91  CALCIUM 9.1 8.2* 8.4* 8.4*  MG  --   --   --  2.0   GFR: Estimated Creatinine Clearance: 69 mL/min (by C-G formula  based on SCr of 0.91 mg/dL). Liver Function Tests: Recent Labs  Lab 03/08/19 1523  AST 22  ALT 17  ALKPHOS 107  BILITOT 0.8  PROT 6.9  ALBUMIN 3.1*   No results for input(s): LIPASE, AMYLASE in the last 168 hours. No results for input(s): AMMONIA in the last 168 hours. Coagulation Profile: Recent Labs  Lab 03/08/19 2244  INR 1.2   Cardiac Enzymes: No results for input(s): CKTOTAL, CKMB, CKMBINDEX, TROPONINI in the last 168 hours. BNP (last 3 results) No results for input(s): PROBNP in the last 8760 hours. HbA1C: Recent Labs    03/08/19 1523  HGBA1C 5.4   CBG: No results for input(s): GLUCAP in the last 168 hours. Lipid Profile: No results for input(s): CHOL, HDL, LDLCALC, TRIG, CHOLHDL, LDLDIRECT in the last 72 hours. Thyroid Function Tests: No results for input(s): TSH, T4TOTAL, FREET4, T3FREE, THYROIDAB in the last 72 hours. Anemia Panel: No results for input(s): VITAMINB12, FOLATE, FERRITIN, TIBC, IRON, RETICCTPCT in the last 72 hours. Urine analysis:    Component Value Date/Time   COLORURINE AMBER (A) 03/08/2019 1519   APPEARANCEUR HAZY (A) 03/08/2019 1519   LABSPEC 1.024 03/08/2019 1519   PHURINE 5.0 03/08/2019 1519   GLUCOSEU NEGATIVE 03/08/2019 1519   HGBUR NEGATIVE 03/08/2019 1519   BILIRUBINUR NEGATIVE 03/08/2019 1519   KETONESUR NEGATIVE 03/08/2019 1519   PROTEINUR NEGATIVE 03/08/2019 1519   NITRITE NEGATIVE 03/08/2019 1519   LEUKOCYTESUR NEGATIVE 03/08/2019 1519   Sepsis Labs: @LABRCNTIP (procalcitonin:4,lacticidven:4)  ) Recent Results (from the past 240 hour(s))  Blood culture (routine x 2)     Status: None (Preliminary result)   Collection Time: 03/08/19  4:17 PM   Specimen: BLOOD  Result Value Ref Range Status   Specimen Description BLOOD LEFT ANTECUBITAL  Final   Special Requests   Final    BOTTLES DRAWN AEROBIC AND ANAEROBIC Blood Culture adequate volume   Culture   Final    NO GROWTH 3 DAYS Performed at [name], [address] [place], [place] [address]    Report Status PENDING  Incomplete  SARS CORONAVIRUS 2 (TAT 6-24 HRS) Nasopharyngeal Nasopharyngeal Swab     Status: None   Collection Time: 03/08/19  4:45 PM   Specimen: Nasopharyngeal Swab  Result Value Ref Range Status   SARS Coronavirus 2 NEGATIVE NEGATIVE Final    Comment: (NOTE) SARS-CoV-2 target nucleic acids are NOT DETECTED. The SARS-CoV-2 RNA is generally detectable in upper and lower respiratory specimens during the acute phase of infection. Negative results do not preclude SARS-CoV-2 infection, do not rule out co-infections with other pathogens, and should not be used as the sole basis for treatment or other patient management decisions. Negative results must be combined with clinical observations, patient history, and epidemiological information. The expected result is Negative. Fact Sheet for Patients: SugarRoll.be Fact Sheet for Healthcare Providers: https://www.woods-mathews.com/ This test is not yet approved or cleared by the [place] FDA and  has been authorized for detection and/or diagnosis of SARS-CoV-2 by FDA under an Emergency Use Authorization (EUA).  This EUA will remain  in effect (meaning this test can be used) for the duration of the COVID-19 declaration under Section 56 4(b)(1) of the Act, 21 U.S.C. section 360bbb-3(b)(1), unless the authorization is terminated or revoked sooner. Performed at [facility], [place] [address] [place], [place] [address]   Blood culture (routine x 2)     Status: None (Preliminary result)   Collection Time: 03/08/19  4:47 PM   Specimen: BLOOD  Result Value Ref Range Status   Specimen Description BLOOD RIGHT ANTECUBITAL  Final   Special Requests   Final    BOTTLES DRAWN AEROBIC AND ANAEROBIC Blood Culture adequate volume   Culture   Final    NO GROWTH 3 DAYS Performed at [facility], [address] [place], [place]  [address]    Report Status PENDING  Incomplete      Studies: No results found.  Scheduled Meds: . collagenase   Topical Daily  . enoxaparin (LOVENOX) injection  40 mg Subcutaneous BID    Continuous Infusions: . sodium chloride 75 mL/hr at 03/11/19 1032  . cefTRIAXone (ROCEPHIN)  IV 2 g (03/11/19 0926)  . vancomycin Stopped (03/10/19 1816)     LOS: 3 days     Annita Brod, MD [facility]  To reach me or the doctor on call, go to: [email or web address] Password [facility]  03/11/2019, 12:45 PM

## 2019-03-12 LAB — GLUCOSE, CAPILLARY: Glucose-Capillary: 102 mg/dL — ABNORMAL HIGH (ref 70–99)

## 2019-03-12 MED ORDER — CLINDAMYCIN HCL 300 MG PO CAPS: 300.0000 mg | ORAL_CAPSULE | Freq: Three times a day (TID) | ORAL | 0 refills | Status: AC

## 2019-03-12 MED ORDER — COLLAGENASE 250 UNIT/GM EX OINT: TOPICAL_OINTMENT | Freq: Every day | CUTANEOUS | 1 refills | Status: DC

## 2019-03-12 NOTE — Discharge Summary (Addendum)
Discharge Summary    Addendum: Patient was to go home, but he felt unsure, weak.  Seen by PT recommended skilled nursing which patient is on board.  Social work notified.  Will cancel discharge and attempt to secure skilled nursing facility.   Alycia Patten. [id number] DOB: 1931-02-12  PCP: Idelle Crouch, MD  Admit date: 03/08/2019 Discharge date: 03/12/2019  Time spent: 25 minutes  Recommendations for Outpatient Follow-up:  1. New medication: Cleocin 300 mg p.o. 3 times daily x2 days 2. Wound care instructions as below 3. Patient be discharged with home health PT, RN and aide  Discharge Diagnoses:  Active Hospital Problems   Diagnosis Date Noted  . Sepsis ([place]) 03/08/2019  . AKI (acute kidney injury) ([place]) 03/08/2019  . Cellulitis of left lower extremity 03/08/2019  . Cellulitis of left leg 03/08/2019  . Lymphedema 04/19/2018  . COPD (chronic obstructive pulmonary disease) ([place]) 04/19/2018  . Hypertension 04/19/2018  . Morbid obesity ([place]) 04/19/2018  . Sleep apnea 04/19/2018    Resolved Hospital Problems  No resolved problems to display.    Discharge Condition: Improved, being discharged home  Wound care:Apply Santyl to LLE wound in a nickel thick layer. Cover with a saline moistened gauze, then ABD pads.  Wrap in [place] and an ACE wrap.  Change daily.  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  Patient treated with IV fluids and antibiotics.   Hospital Course:   Sepsis secondary to cellulitis of both lower extremities: Patient with criteria for sepsis given left leg cellulitis as the source with lactic acidosis, leukocytosis and acute kidney injury organ dysfunction. Blood cultures with no growth to date Sepsis has now resolved following IV antibiotics and fluids, lactic acid level normalized.    Patient continued on antibiotics.  He will be discharged on 2 more days of clindamycin.  Wound care as below.  Wound care with following recommendations:Apply Santyl to LLE wound in a nickel thick layer. Cover with a saline moistened gauze, then ABD pads. Wrap in [place] and an ACE wrap. Change daily.    Home health PT, RN and aide will follow.  Marland KitchenCOPD (chronic obstructive pulmonary disease) ([place]): Stable. Breathing comfortably.  .Hypertension: Stable.  .Morbid obesity [facility]): Patient meets criteria BMI greater than 40  Lymphedema: Underlying contributory factor to his cellulitis.  Marland KitchenAKI (acute kidney injury) ([place]): Secondary to dehydration for sepsis.  Improved with IV fluids.  Normalized as of 11/8  Procedures:  None  Consultations:  None  Discharge Exam: BP (!) 155/69 (BP Location: Left Arm)   Pulse 86   Temp 98.4 F (36.9 C) (Oral)   Resp 18   Ht 5\' 6"  (1.676 m)   Wt 113.6 kg   SpO2 96%   BMI 40.42 kg/m   General: Alert and oriented x3, no acute distress Cardiovascular: Regular rate and rhythm, S1-S2 Respiratory: Clear to auscultation bilaterally  Discharge Instructions You were cared for by a hospitalist during your  hospital stay. If you have any questions about your discharge medications or the care you received while you were in the hospital after you are discharged, you can call the unit and asked to speak with the hospitalist on call if the hospitalist that took care of you is not available. Once you are discharged, your primary care physician will handle any further medical issues. Please note that NO REFILLS for any  discharge medications will be authorized once you are discharged, as it is imperative that you return to your primary care physician (or establish a relationship with a primary care physician if you do not have one) for your aftercare needs so that they can reassess your need for medications and monitor your lab values.  Discharge Instructions    Diet - low sodium heart healthy   Complete by: As directed    Discharge wound care:   Complete by: As directed    Apply Santyl to LLE wound in a nickel thick layer. Cover with a saline moistened gauze, then ABD pads.  Wrap in [place] and an ACE wrap.  Change daily.   Increase activity slowly   Complete by: As directed      Allergies as of 03/12/2019   No Known Allergies     Medication List    TAKE these medications   aspirin 81 MG tablet Take 81 mg by mouth daily.   clindamycin 300 MG capsule Commonly known as: CLEOCIN Take 1 capsule (300 mg total) by mouth 3 (three) times daily for 2 days.   collagenase ointment Commonly known as: SANTYL Apply topically daily. Start taking on: March 13, 2019   hydrochlorothiazide 25 MG tablet Commonly known as: HYDRODIURIL Take 25 mg by mouth daily.   lisinopril 20 MG tablet Commonly known as: ZESTRIL Take 40 mg by mouth daily.   omeprazole 40 MG capsule Commonly known as: PRILOSEC Take 40 mg by mouth daily.            Discharge Care Instructions  (From admission, onward)         Start     Ordered   03/12/19 0000  Discharge wound care:    Comments: Apply Santyl to LLE wound in a nickel thick layer. Cover with a saline moistened gauze, then ABD pads.  Wrap in [place] and an ACE wrap.  Change daily.   03/12/19 1234         No Known Allergies    The results of significant diagnostics from this hospitalization (including imaging, microbiology, ancillary and laboratory) are listed below for reference.    Significant Diagnostic Studies: No results found.  Microbiology: Recent  Results (from the past 240 hour(s))  Blood culture (routine x 2)     Status: None (Preliminary result)   Collection Time: 03/08/19  4:17 PM   Specimen: BLOOD  Result Value Ref Range Status   Specimen Description BLOOD LEFT ANTECUBITAL  Final   Special Requests   Final    BOTTLES DRAWN AEROBIC AND ANAEROBIC Blood Culture adequate volume   Culture   Final    NO GROWTH 4 DAYS Performed at [facility], [address] [place], [place] [address]    Report Status PENDING  Incomplete  SARS CORONAVIRUS 2 (TAT 6-24 HRS) Nasopharyngeal Nasopharyngeal Swab     Status: None   Collection Time: 03/08/19  4:45 PM   Specimen: Nasopharyngeal Swab  Result Value Ref Range Status   SARS Coronavirus 2 NEGATIVE NEGATIVE Final    Comment: (NOTE)  SARS-CoV-2 target nucleic acids are NOT DETECTED. The SARS-CoV-2 RNA is generally detectable in upper and lower respiratory specimens during the acute phase of infection. Negative results do not preclude SARS-CoV-2 infection, do not rule out co-infections with other pathogens, and should not be used as the sole basis for treatment or other patient management decisions. Negative results must be combined with clinical observations, patient history, and epidemiological information. The expected result is Negative. Fact Sheet for Patients: SugarRoll.be Fact Sheet for Healthcare Providers: https://www.woods-mathews.com/ This test is not yet approved or cleared by the [place] FDA and  has been authorized for detection and/or diagnosis of SARS-CoV-2 by FDA under an Emergency Use Authorization (EUA). This EUA will remain  in effect (meaning this test can be used) for the duration of the COVID-19 declaration under Section 56 4(b)(1) of the Act, 21 U.S.C. section 360bbb-3(b)(1), unless the authorization is terminated or revoked sooner. Performed at [facility], [place] [address] [place],  [place] [address]   Blood culture (routine x 2)     Status: None (Preliminary result)   Collection Time: 03/08/19  4:47 PM   Specimen: BLOOD  Result Value Ref Range Status   Specimen Description BLOOD RIGHT ANTECUBITAL  Final   Special Requests   Final    BOTTLES DRAWN AEROBIC AND ANAEROBIC Blood Culture adequate volume   Culture   Final    NO GROWTH 4 DAYS Performed at [facility], [place], [place] [address]    Report Status PENDING  Incomplete     Labs: Basic Metabolic Panel: Recent Labs  Lab 03/08/19 1523 03/09/19 0534 03/10/19 0450 03/11/19 0451  NA 139 137 135 137  K 4.4 4.3 4.4 4.2  CL 103 105 102 103  CO2 24 23 25 27   GLUCOSE 175* 112* 103* 94  BUN 58* 45* 33* 20  CREATININE 1.88* 1.25* 1.16 0.91  CALCIUM 9.1 8.2* 8.4* 8.4*  MG  --   --   --  2.0   Liver Function Tests: Recent Labs  Lab 03/08/19 1523  AST 22  ALT 17  ALKPHOS 107  BILITOT 0.8  PROT 6.9  ALBUMIN 3.1*   No results for input(s): LIPASE, AMYLASE in the last 168 hours. No results for input(s): AMMONIA in the last 168 hours. CBC: Recent Labs  Lab 03/08/19 1523 03/10/19 0450  WBC 12.6* 10.4  NEUTROABS 10.5*  --   HGB 14.4 13.7  HCT 45.6 41.4  MCV 96.0 90.4  PLT 363 330   Cardiac Enzymes: No results for input(s): CKTOTAL, CKMB, CKMBINDEX, TROPONINI in the last 168 hours. BNP: BNP (last 3 results) No results for input(s): BNP in the last 8760 hours.  ProBNP (last 3 results) No results for input(s): PROBNP in the last 8760 hours.  CBG: Recent Labs  Lab 03/11/19 1655 03/12/19 0822  GLUCAP 135* 102*       Signed:  Annita Brod, MD [facility] 03/12/2019, 12:35 PM
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
## 2019-03-12 NOTE — TOC Initial Note (Signed)
Transition of Care (TOC) - Initial/Assessment Note    Patient Details  Name: Steven Meza. MRN: [id number] Date of Birth: [birth year]  Transition of Care [facility]) CM/SW Contact:    Ross Ludwig, LCSW Phone Number: 03/12/2019, 5:24 PM  Clinical Narrative:                  Patient is an 85 year old male who is alert and oriented x4.  Patient is married and lives with his wife.  Patient states he has been to rehab in the past, CSW spoke to patient, and he was at [facility] several years ago when he had knee surgery.  CSW explained recommendation by physical therapist and physician, patient at first wanted to go home with home health, then CSW spoke to patient, and he agreed to go to SNF for short term rehab.  Patient gave CSW permission to speak to his wife as well, patient's wife feels he needs SNF first before he is able to go home.  Patient's wife was concerned about Covid as was patient, CSW informed both of them that all the facilities are testing patients for Covid.  CSW explained what the process is for getting SNF placement, and receiving insurance authorization.  CSW informed both of them it may take a day or two for auth to be received.  CSW was given permission to begin bed search in [place].  CSW to fax information to facilities.  CSW started insurance approval, and have sent clinical information to [facility] Medicare.  Expected Discharge Plan: Skilled Nursing Facility Barriers to Discharge: Continued Medical Work up, Ship broker   Patient Goals and CMS Choice Patient states their goals for this hospitalization and ongoing recovery are:: To go to SNF for short term rehab, then return back home. CMS Medicare.gov Compare Post Acute Care list provided to:: Patient Choice offered to / list presented to : Patient, Spouse  Expected Discharge Plan and Services Expected Discharge Plan: [place] In-house Referral: Clinical Social Work   Post Acute Care  Choice: [place] Living arrangements for the past 2 months: [place] Expected Discharge Date: 03/12/19               DME Arranged: N/A DME Agency: NA                  Prior Living Arrangements/Services Living arrangements for the past 2 months: Single Family Home Lives with:: Spouse Patient language and need for interpreter reviewed:: Yes Do you feel safe going back to the place where you live?: No   Patient and his wife, feel that he needs some short term rehab, before he is able to return back home.  Need for Family Participation in Patient Care: No (Comment) Care giver support system in place?: No (comment)   Criminal Activity/Legal Involvement Pertinent to Current Situation/Hospitalization: No - Comment as needed  Activities of Daily Living Home Assistive Devices/Equipment: None ADL Screening (condition at time of admission) Patient's cognitive ability adequate to safely complete daily activities?: Yes Is the patient deaf or have difficulty hearing?: Yes Does the patient have difficulty seeing, even when wearing glasses/contacts?: Yes Does the patient have difficulty concentrating, remembering, or making decisions?: No Patient able to express need for assistance with ADLs?: Yes Does the patient have difficulty dressing or bathing?: No Independently performs ADLs?: Yes (appropriate for developmental age) Does the patient have difficulty walking or climbing stairs?: Yes Weakness of Legs: Left Weakness of Arms/Hands: None  Permission Sought/Granted Permission sought to share information with : Facility Sport and exercise psychologist, Family Supports Permission granted to share information with : Yes, Verbal Permission Granted  Share Information with NAME: [name],[name] Spouse [phone]  [phone] or [name] Daughter [phone]  Permission granted to share info w AGENCY: SNF admissions        Emotional Assessment Appearance::  Appears stated age Attitude/Demeanor/Rapport: Engaged Affect (typically observed): Appropriate, Calm, Accepting, Pleasant, Stable Orientation: : Oriented to Self, Oriented to Place, Oriented to  Time, Oriented to Situation Alcohol / Substance Use: Not Applicable Psych Involvement: No (comment)  Admission diagnosis:  Cellulitis of left lower extremity [L03.116] Cellulitis of left leg [L03.116] Patient Active Problem List   Diagnosis Date Noted  . Sepsis ([place]) 03/08/2019  . AKI (acute kidney injury) ([place]) 03/08/2019  . Cellulitis of left lower extremity 03/08/2019  . Cellulitis of left leg 03/08/2019  . Pain due to onychomycosis of toenails of both feet 10/23/2018  . Lymphedema 04/19/2018  . COPD (chronic obstructive pulmonary disease) ([place]) 04/19/2018  . Diverticulosis 04/19/2018  . History of colonic polyps 04/19/2018  . Hypertension 04/19/2018  . Morbid obesity ([place]) 04/19/2018  . Osteoarthritis 04/19/2018  . Sleep apnea 04/19/2018  . Stroke ([place]) 04/19/2018  . Venous stasis 04/19/2018  . History of bladder cancer 02/20/2013  . Increased frequency of urination 02/20/2013  . Malignant neoplasm of other specified sites of bladder 02/20/2013  . Microscopic hematuria 02/20/2013   PCP:  Idelle Crouch, MD Pharmacy:   [facility] [address] ([address]), [place] [address] [place]) [place] [address] Phone: [phone] Fax: [phone]     Social Determinants of Health (SDOH) Interventions    Readmission Risk Interventions No flowsheet data found.

## 2019-03-12 NOTE — NC FL2 (Signed)
[place] LEVEL OF CARE SCREENING TOOL     IDENTIFICATION  Patient Name: Steven Meza. Birthdate: [birth year] Sex: male Admission Date (Current Location): 03/08/2019  [place] and [place] Number:  Engineering geologist and Address:  [facility], [address] [place], [place] [address]      Provider Number: [id number]  Attending Physician Name and Address:  Annita Brod, MD  Relative Name and Phone Number:  [place], [place] [phone]  [phone] or [name] Daughter [phone]    Current Level of Care: Hospital Recommended Level of Care: [place] Prior Approval Number:    Date Approved/Denied:   PASRR Number: [id number]  Discharge Plan: SNF    Current Diagnoses: Patient Active Problem List   Diagnosis Date Noted  . Sepsis ([place]) 03/08/2019  . AKI (acute kidney injury) ([place]) 03/08/2019  . Cellulitis of left lower extremity 03/08/2019  . Cellulitis of left leg 03/08/2019  . Pain due to onychomycosis of toenails of both feet 10/23/2018  . Lymphedema 04/19/2018  . COPD (chronic obstructive pulmonary disease) ([place]) 04/19/2018  . Diverticulosis 04/19/2018  . History of colonic polyps 04/19/2018  . Hypertension 04/19/2018  . Morbid obesity ([place]) 04/19/2018  . Osteoarthritis 04/19/2018  . Sleep apnea 04/19/2018  . Stroke ([place]) 04/19/2018  . Venous stasis 04/19/2018  . History of bladder cancer 02/20/2013  . Increased frequency of urination 02/20/2013  . Malignant neoplasm of other specified sites of bladder 02/20/2013  . Microscopic hematuria 02/20/2013    Orientation RESPIRATION BLADDER Height & Weight     Self, Time, Situation, Place  Normal Continent Weight: 250 lb 6.4 oz (113.6 kg) Height:  5\' 6"  (167.6 cm)  BEHAVIORAL SYMPTOMS/MOOD NEUROLOGICAL BOWEL NUTRITION STATUS      Continent Diet(low sodium)  AMBULATORY STATUS COMMUNICATION OF NEEDS Skin   Limited Assist  Verbally Surgical wounds                       Personal Care Assistance Level of Assistance  Dressing, Bathing, Feeding Bathing Assistance: Limited assistance Feeding assistance: Limited assistance Dressing Assistance: Limited assistance     Functional Limitations Info  Sight, Speech, Hearing Sight Info: Adequate Hearing Info: Adequate Speech Info: Adequate    SPECIAL CARE FACTORS FREQUENCY  PT (By licensed PT), OT (By licensed OT)     PT Frequency: Minimum 5x a week OT Frequency: Minimum 5x a week            Contractures Contractures Info: Not present    Additional Factors Info  Code Status, Allergies Code Status Info: Full Code Allergies Info: No Known Allergies           Current Medications (03/12/2019):  This is the current hospital active medication list Current Facility-Administered Medications  Medication Dose Route Frequency Provider Last Rate Last Dose  . 0.9 %  sodium chloride infusion   Intravenous Continuous Annita Brod, MD 75 mL/hr at 03/11/19 2022    . acetaminophen (TYLENOL) tablet 650 mg  650 mg Oral Q6H PRN Annita Brod, MD   650 mg at 03/12/19 1036   Or  . acetaminophen (TYLENOL) suppository 650 mg  650 mg Rectal Q6H PRN Annita Brod, MD      . cefTRIAXone (ROCEPHIN) 2 g in sodium chloride 0.9 % 100 mL IVPB  2 g Intravenous Q24H Annita Brod, MD 200 mL/hr at 03/12/19 1040 2 g at 03/12/19 1040  . collagenase (SANTYL) ointment  Topical Daily Annita Brod, MD      . enoxaparin (LOVENOX) injection 40 mg  40 mg Subcutaneous BID Annita Brod, MD   40 mg at 03/12/19 1031  . ondansetron (ZOFRAN) tablet 4 mg  4 mg Oral Q6H PRN Annita Brod, MD       Or  . ondansetron [facility]) injection 4 mg  4 mg Intravenous Q6H PRN Annita Brod, MD      . polyethylene glycol (MIRALAX / GLYCOLAX) packet 17 g  17 g Oral Daily PRN Annita Brod, MD      . vancomycin (VANCOCIN) IVPB 1000 mg/200 mL premix  1,000 mg  Intravenous Q24H Lu Duffel, [place] at 03/11/19 1816     Discharge Medications: Please see discharge summary for a list of discharge medications.  Relevant Imaging Results:  Relevant Lab Results:   Additional Information SSN [id number]  Ross Ludwig, LCSW

## 2019-03-12 NOTE — Evaluation (Signed)
Physical Therapy Evaluation Patient Details Name: Steven Meza. MRN: [id number] DOB: Sep 25, 1930 Today's Date: 03/12/2019   History of Present Illness  From MD H&P: Pt is an 85 y.o. 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  Over the last few days, he has had increasing erythema and pain to the point where walking became very uncomfortable.  He tells me that he tried to follow the wound care recommendations at home, but it was not always able to.  Patient was seen today and sent over after there were concerns for cellulitis.   ED Course: In the emergency room, patient's leg consistent with diffuse cellulitis with an underlying ulceration over the anterior aspect of his left leg below the knee.  Labs were checked and he was found to have an elevated lactic acid level of 2.6.  Rest of his labs noteworthy for a CBG of 175, creatinine of 1.88 with a BUN of 58 and a white count of 12.6, consistent with early sepsis.  Blood cultures were drawn and patient was started on IV antibiotics.  Hospitalist were called for further evaluation.  MD assessment includes:  Sepsis secondary to cellulitis of both lower extremities, COPD, HTN, morbid obesity, lymphedema, AKI, and wound care following.    Clinical Impression  Pt presented with deficits in strength, transfers, mobility, gait, balance, and activity tolerance.  Pt required min A with bed mobility tasks and min to mod A with transfers most notably to prevent LOB upon initial stand.  Pt very anxious upon standing and required extensive encouragement and cuing to move his hand from the bed to the RW.  Pt leaned heavily on the RW in standing and was only able to take several very small, effortful steps at the EOB with min A for stability before requiring to return to sitting.  Pt demonstrated poor eccentric control when returning to  sitting at the EOB and is at a very high risk for falls.  Pt presents with significant functional deficits compared to his baseline and is not safe to return to his prior living situation at this time.  Pt will benefit from PT services in a SNF setting upon discharge to safely address above deficits for decreased caregiver assistance and eventual return to PLOF.      Follow Up Recommendations SNF    Equipment Recommendations  None recommended by PT    Recommendations for Other Services       Precautions / Restrictions Precautions Precautions: Fall Restrictions Weight Bearing Restrictions: No      Mobility  Bed Mobility Overal bed mobility: Needs Assistance Bed Mobility: Supine to Sit;Sit to Supine     Supine to sit: Min assist Sit to supine: Supervision   General bed mobility comments: Min A for BLEs out of bed and for trunk to full upright position during sup to sit  Transfers Overall transfer level: Needs assistance Equipment used: Rolling walker (2 wheeled) Transfers: Sit to/from Stand Sit to Stand: Min assist;+2 safety/equipment;Mod assist         General transfer comment: Mod to max verbal and tactile cues for sequencing and min to mod A for stability upon standing  Ambulation/Gait Ambulation/Gait assistance: Min assist;+2 safety/equipment Gait Distance (Feet): 2 Feet Assistive device: Rolling walker (2 wheeled) Gait Pattern/deviations: Step-to pattern;Trunk flexed;Decreased step length - right;Decreased step length - left;Decreased stance time - left Gait velocity: decreased  General Gait Details: Heavy lean on the RW with min A for stability during amb; pt only able to take several effortful steps at the EOB with decreased LLE stance time  Stairs            Wheelchair Mobility    Modified Rankin (Stroke Patients Only)       Balance Overall balance assessment: Needs assistance Sitting-balance support: Feet supported Sitting balance-Leahy  Scale: Fair     Standing balance support: Bilateral upper extremity supported Standing balance-Leahy Scale: Poor Standing balance comment: Min to mod A in standing for stability                             Pertinent Vitals/Pain Pain Assessment: No/denies pain    Home Living Family/patient expects to be discharged to:: Private residence Living Arrangements: Spouse/significant other Available Help at Discharge: Family;Available 24 hours/day Type of Home: House Home Access: Level entry     Home Layout: One level Home Equipment: Grab bars - toilet;Shower seat - built in;Walker - 4 wheels;Walker - 2 wheels;Cane - quad;Hand held shower head;Hospital bed Additional Comments: Owns a lift chair    Prior Function Level of Independence: Independent with assistive device(s)         Comments: Mod Ind amb with a QC limited community distances, 6 falls in the last 6 months, Ind with ADLs     Hand Dominance        Extremity/Trunk Assessment   Upper Extremity Assessment Upper Extremity Assessment: Generalized weakness    Lower Extremity Assessment Lower Extremity Assessment: Generalized weakness       Communication   Communication: HOH  Cognition Arousal/Alertness: Awake/alert Behavior During Therapy: WFL for tasks assessed/performed Overall Cognitive Status: Within Functional Limits for tasks assessed                                        General Comments      Exercises Total Joint Exercises Ankle Circles/Pumps: AROM;Strengthening;Both;5 reps;10 reps Quad Sets: Strengthening;Both;5 reps;10 reps Gluteal Sets: Strengthening;Both;5 reps;10 reps Short Arc Quad: Strengthening;Both;10 reps Heel Slides: AROM;Both;5 reps Hip ABduction/ADduction: AROM;AAROM;Both;10 reps Straight Leg Raises: AROM;AAROM;Both;10 reps Long Arc Quad: AROM;Strengthening;Both;10 reps Other Exercises Other Exercises: Mini squats x 5 with low amplitude    Assessment/Plan    PT Assessment Patient needs continued PT services  PT Problem List Decreased strength;Decreased activity tolerance;Decreased balance;Decreased mobility;Decreased knowledge of use of DME       PT Treatment Interventions DME instruction;Gait training;Functional mobility training;Therapeutic activities;Therapeutic exercise;Balance training;Patient/family education    PT Goals (Current goals can be found in the Care Plan section)  Acute Rehab PT Goals Patient Stated Goal: To get stronger before going home PT Goal Formulation: With patient Time For Goal Achievement: 03/25/19 Potential to Achieve Goals: Fair    Frequency Min 2X/week   Barriers to discharge Inaccessible home environment;Decreased caregiver support      Co-evaluation               AM-PAC PT "6 Clicks" Mobility  Outcome Measure Help needed turning from your back to your side while in a flat bed without using bedrails?: A Little Help needed moving from lying on your back to sitting on the side of a flat bed without using bedrails?: A Little Help needed moving to and from a bed to a chair (including a wheelchair)?: A  Lot Help needed standing up from a chair using your arms (e.g., wheelchair or bedside chair)?: A Lot Help needed to walk in hospital room?: Total Help needed climbing 3-5 steps with a railing? : Total 6 Click Score: 12    End of Session Equipment Utilized During Treatment: Gait belt Activity Tolerance: Patient tolerated treatment well Patient left: in bed;with call bell/phone within reach;with bed alarm set Nurse Communication: Mobility status PT Visit Diagnosis: Unsteadiness on feet (R26.81);History of falling (Z91.81);Difficulty in walking, not elsewhere classified (R26.2);Muscle weakness (generalized) (M62.81)    Time: [id number] PT Time Calculation (min) (ACUTE ONLY): 33 min   Charges:   PT Evaluation $PT Eval Moderate Complexity: 1 Mod PT Treatments $Therapeutic  Exercise: 8-22 mins        D. Royetta Asal PT, DPT 03/12/19, 4:07 PM

## 2019-03-12 NOTE — Care Management Important Message (Signed)
Important Message  Patient Details  Name: Steven Meza. MRN: [id number] Date of Birth: [birth year]   Medicare Important Message Given:  Yes     Dannette Barbara 03/12/2019, 11:52 AM

## 2019-03-12 NOTE — Plan of Care (Signed)
  Problem: Education: Goal: Knowledge of General Education information will improve Description: Including pain rating scale, medication(s)/side effects and non-pharmacologic comfort measures 03/12/2019 0402 by Liliane Channel, RN Outcome: Progressing 03/12/2019 0344 by Liliane Channel, RN Outcome: Progressing   Problem: Clinical Measurements: Goal: Will remain free from infection Outcome: Progressing   Problem: Pain Managment: Goal: General experience of comfort will improve 03/12/2019 0402 by Liliane Channel, RN Outcome: Progressing 03/12/2019 0344 by Liliane Channel, RN Outcome: Progressing   Problem: Safety: Goal: Ability to remain free from injury will improve 03/12/2019 0402 by Liliane Channel, RN Outcome: Progressing 03/12/2019 0344 by Liliane Channel, RN Outcome: Progressing

## 2019-03-12 NOTE — Plan of Care (Signed)

## 2019-03-13 LAB — CULTURE, BLOOD (ROUTINE X 2)
Culture: NO GROWTH
Culture: NO GROWTH
Special Requests: ADEQUATE
Special Requests: ADEQUATE

## 2019-03-13 LAB — SARS CORONAVIRUS 2 (TAT 6-24 HRS): SARS Coronavirus 2: NEGATIVE

## 2019-03-13 NOTE — TOC Transition Note (Signed)
Transition of Care [facility]) - CM/SW Discharge Note   Patient Details  Name: Steven Meza. MRN: [id number] Date of Birth: Sep 20, 1930  [facility] CM/SW Contact:  Ross Ludwig, LCSW Phone Number: 03/13/2019, 3:44 PM   Clinical Narrative:     CSW received phone call from patient's insurance company that he has been approved for SNF, auth number [id number].  CSW updated [facility], and they confirmed that patient can be admitted today to SNF.  Patient to be d/c'ed today to [facility].  Patient and family agreeable to plans will transport via ems RN to call report to [phone].  Paitient's wife would like to be notified when EMS arrives and patient is on his way to SNF.   Final next level of care: Skilled Nursing Facility Barriers to Discharge: Barriers Resolved   Patient Goals and CMS Choice Patient states their goals for this hospitalization and ongoing recovery are:: Patient states the goal is to go to SNF for short term rehab, and then returning back home with home health. CMS Medicare.gov Compare Post Acute Care list provided to:: Patient Choice offered to / list presented to : Patient  Discharge Placement   Existing PASRR number confirmed : 03/12/19          Patient chooses bed at: [facility] Patient to be transferred to facility by: [facility] Name of family member notified: Patient's wife [name] [phone] is aware that patient will be discharging today. Patient and family notified of of transfer: 03/13/19  Discharge Plan and Services In-house Referral: Clinical Social Work   Post Acute Care Choice: [place]          DME Arranged: N/A DME Agency: NA     Representative spoke with at DME Agency: na HH Arranged: NA          Social Determinants of Health ([place]) Interventions     Readmission Risk Interventions No flowsheet data found.

## 2019-03-13 NOTE — Plan of Care (Signed)
  Problem: Clinical Measurements: Goal: Diagnostic test results will improve Outcome: Progressing   Problem: Safety: Goal: Ability to remain free from injury will improve Outcome: Progressing   Problem: Skin Integrity: Goal: Risk for impaired skin integrity will decrease Outcome: Progressing   

## 2019-03-13 NOTE — Plan of Care (Signed)
  Problem: Clinical Measurements: Goal: Diagnostic test results will improve Outcome: Progressing Goal: Cardiovascular complication will be avoided Outcome: Progressing   Problem: Activity: Goal: Risk for activity intolerance will decrease Outcome: Progressing   Problem: Safety: Goal: Ability to remain free from injury will improve Outcome: Progressing   Problem: Skin Integrity: Goal: Risk for impaired skin integrity will decrease Outcome: Progressing

## 2019-03-13 NOTE — Discharge Summary (Signed)
Discharge Summary   Steven Meza. [id number] DOB: Aug 09, 1930  PCP: Idelle Crouch, MD  Admit date: 03/08/2019 Discharge date: 03/13/2019  Time spent: 25 minutes  Recommendations for Outpatient Follow-up:  1. New medication: Cleocin 300 mg p.o. 3 times daily x2 days 2. Wound care instructions as below 3. Patient be discharged to skilled nursing  Discharge Diagnoses:  Active Hospital Problems   Diagnosis Date Noted  . Sepsis ([place]) 03/08/2019  . AKI (acute kidney injury) ([place]) 03/08/2019  . Cellulitis of left lower extremity 03/08/2019  . Cellulitis of left leg 03/08/2019  . Lymphedema 04/19/2018  . COPD (chronic obstructive pulmonary disease) ([place]) 04/19/2018  . Hypertension 04/19/2018  . Morbid obesity ([place]) 04/19/2018  . Sleep apnea 04/19/2018    Resolved Hospital Problems  No resolved problems to display.    Discharge Condition: Improved, being discharged to skilled nursing  Wound care:Apply Santyl to LLE wound in a nickel thick layer. Cover with a saline moistened gauze, then ABD pads.  Wrap in [place] and an ACE wrap.  Change daily.  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  Patient treated with IV fluids and antibiotics.   Hospital Course:  Sepsis secondary to cellulitis of both lower extremities: Patient with criteria for sepsis given left leg cellulitis as the source with lactic acidosis, leukocytosis and acute kidney injury organ dysfunction. Blood cultures with no  growth to date Sepsis has now resolved following IV antibiotics and fluids, lactic acid level normalized.    Patient continued on antibiotics.  He will be discharged on 2 more days of clindamycin.  Wound care as below.  Wound care with following recommendations:Apply Santyl to LLE wound in a nickel thick layer. Cover with a saline moistened gauze, then ABD pads. Wrap in [place] and an ACE wrap. Change daily.    Patient will continue to have this done at the skilled nursing facility  .COPD (chronic obstructive pulmonary disease) ([place]): Stable. Breathing comfortably.  .Hypertension: Stable.  .Morbid obesity [facility]): Patient meets criteria BMI greater than 40  Lymphedema: Underlying contributory factor to his cellulitis.  Deconditioning: Initially patient had plan to go home.  However on 11/9 when working with PT, he was still found to be quite unsteady and had concerns.  PT felt he would benefit from skilled nursing which patient was amenable to after discussing with his wife.  Marland KitchenAKI (acute kidney injury) ([place]): Secondary to dehydration for sepsis.  Improved with IV fluids.  Normalized as of 11/8  Procedures:  None  Consultations:  None  Discharge Exam: BP 131/61 (BP Location: Left Arm)   Pulse 91   Temp 99.4 F (37.4 C) (Oral)   Resp 19   Ht 5\' 6"  (1.676 m)   Wt 110.8 kg   SpO2 94%   BMI 39.41 kg/m   General: Alert and oriented x3, no acute distress Cardiovascular: Regular rate and rhythm, S1-S2 Respiratory: Clear to auscultation bilaterally  Discharge Instructions You were cared  for by a hospitalist during your hospital stay. If you have any questions about your discharge medications or the care you received while you were in the hospital after you are discharged, you can call the unit and asked to speak with the hospitalist on call if the hospitalist that took care of you is not available. Once you are discharged, your primary care physician will handle any further  medical issues. Please note that NO REFILLS for any discharge medications will be authorized once you are discharged, as it is imperative that you return to your primary care physician (or establish a relationship with a primary care physician if you do not have one) for your aftercare needs so that they can reassess your need for medications and monitor your lab values.  Discharge Instructions    Diet - low sodium heart healthy   Complete by: As directed    Discharge wound care:   Complete by: As directed    Apply Santyl to LLE wound in a nickel thick layer. Cover with a saline moistened gauze, then ABD pads.  Wrap in [place] and an ACE wrap.  Change daily.   Increase activity slowly   Complete by: As directed      Allergies as of 03/13/2019   No Known Allergies     Medication List    TAKE these medications   aspirin 81 MG tablet Take 81 mg by mouth daily.   clindamycin 300 MG capsule Commonly known as: CLEOCIN Take 1 capsule (300 mg total) by mouth 3 (three) times daily for 2 days.   collagenase ointment Commonly known as: SANTYL Apply topically daily.   hydrochlorothiazide 25 MG tablet Commonly known as: HYDRODIURIL Take 25 mg by mouth daily.   lisinopril 20 MG tablet Commonly known as: ZESTRIL Take 40 mg by mouth daily.   omeprazole 40 MG capsule Commonly known as: PRILOSEC Take 40 mg by mouth daily.            Discharge Care Instructions  (From admission, onward)         Start     Ordered   03/12/19 0000  Discharge wound care:    Comments: Apply Santyl to LLE wound in a nickel thick layer. Cover with a saline moistened gauze, then ABD pads.  Wrap in [place] and an ACE wrap.  Change daily.   03/12/19 1234         No Known Allergies    The results of significant diagnostics from this hospitalization (including imaging, microbiology, ancillary and laboratory) are listed below for reference.    Significant Diagnostic Studies: No results  found.  Microbiology: Recent Results (from the past 240 hour(s))  Blood culture (routine x 2)     Status: None   Collection Time: 03/08/19  4:17 PM   Specimen: BLOOD  Result Value Ref Range Status   Specimen Description BLOOD LEFT ANTECUBITAL  Final   Special Requests   Final    BOTTLES DRAWN AEROBIC AND ANAEROBIC Blood Culture adequate volume   Culture   Final    NO GROWTH 5 DAYS Performed at [facility], [address] [place], [place] [address]    Report Status 03/13/2019 FINAL  Final  SARS CORONAVIRUS 2 (TAT 6-24 HRS) Nasopharyngeal Nasopharyngeal Swab     Status: None   Collection Time: 03/08/19  4:45 PM   Specimen: Nasopharyngeal Swab  Result Value Ref Range Status   SARS Coronavirus 2 NEGATIVE NEGATIVE Final    Comment: (NOTE) SARS-CoV-2  target nucleic acids are NOT DETECTED. The SARS-CoV-2 RNA is generally detectable in upper and lower respiratory specimens during the acute phase of infection. Negative results do not preclude SARS-CoV-2 infection, do not rule out co-infections with other pathogens, and should not be used as the sole basis for treatment or other patient management decisions. Negative results must be combined with clinical observations, patient history, and epidemiological information. The expected result is Negative. Fact Sheet for Patients: SugarRoll.be Fact Sheet for Healthcare Providers: https://www.woods-mathews.com/ This test is not yet approved or cleared by the [place] FDA and  has been authorized for detection and/or diagnosis of SARS-CoV-2 by FDA under an Emergency Use Authorization (EUA). This EUA will remain  in effect (meaning this test can be used) for the duration of the COVID-19 declaration under Section 56 4(b)(1) of the Act, 21 U.S.C. section 360bbb-3(b)(1), unless the authorization is terminated or revoked sooner. Performed at [facility], [place] [address]  [place], [place] [address]   Blood culture (routine x 2)     Status: None   Collection Time: 03/08/19  4:47 PM   Specimen: BLOOD  Result Value Ref Range Status   Specimen Description BLOOD RIGHT ANTECUBITAL  Final   Special Requests   Final    BOTTLES DRAWN AEROBIC AND ANAEROBIC Blood Culture adequate volume   Culture   Final    NO GROWTH 5 DAYS Performed at [facility], [address] [place], [place] [address]    Report Status 03/13/2019 FINAL  Final     Labs: Basic Metabolic Panel: Recent Labs  Lab 03/08/19 1523 03/09/19 0534 03/10/19 0450 03/11/19 0451  NA 139 137 135 137  K 4.4 4.3 4.4 4.2  CL 103 105 102 103  CO2 24 23 25 27   GLUCOSE 175* 112* 103* 94  BUN 58* 45* 33* 20  CREATININE 1.88* 1.25* 1.16 0.91  CALCIUM 9.1 8.2* 8.4* 8.4*  MG  --   --   --  2.0   Liver Function Tests: Recent Labs  Lab 03/08/19 1523  AST 22  ALT 17  ALKPHOS 107  BILITOT 0.8  PROT 6.9  ALBUMIN 3.1*   No results for input(s): LIPASE, AMYLASE in the last 168 hours. No results for input(s): AMMONIA in the last 168 hours. CBC: Recent Labs  Lab 03/08/19 1523 03/10/19 0450  WBC 12.6* 10.4  NEUTROABS 10.5*  --   HGB 14.4 13.7  HCT 45.6 41.4  MCV 96.0 90.4  PLT 363 330   Cardiac Enzymes: No results for input(s): CKTOTAL, CKMB, CKMBINDEX, TROPONINI in the last 168 hours. BNP: BNP (last 3 results) No results for input(s): BNP in the last 8760 hours.  ProBNP (last 3 results) No results for input(s): PROBNP in the last 8760 hours.  CBG: Recent Labs  Lab 03/11/19 1655 03/12/19 0822  GLUCAP 135* 102*       Signed:  Annita Brod, MD [facility] 03/13/2019, 12:45 PM

## 2019-03-14 ENCOUNTER — Ambulatory Visit: Payer: Medicare Other | Admitting: Internal Medicine

## 2019-03-25 ENCOUNTER — Other Ambulatory Visit: Payer: Self-pay

## 2019-03-25 ENCOUNTER — Inpatient Hospital Stay
Admission: EM | Admit: 2019-03-25 | Discharge: 2019-03-28 | DRG: 315 | Disposition: A | Discharge status: Skilled Nursing Facility | Payer: Medicare Other | Source: Skilled Nursing Facility | Attending: Internal Medicine | Admitting: Internal Medicine

## 2019-03-25 DIAGNOSIS — S81802A Unspecified open wound, left lower leg, initial encounter: Secondary | ICD-10-CM | POA: Diagnosis not present

## 2019-03-25 DIAGNOSIS — Z87891 Personal history of nicotine dependence: Secondary | ICD-10-CM

## 2019-03-25 DIAGNOSIS — Z6839 Body mass index (BMI) 39.0-39.9, adult: Secondary | ICD-10-CM

## 2019-03-25 DIAGNOSIS — G4733 Obstructive sleep apnea (adult) (pediatric): Secondary | ICD-10-CM | POA: Diagnosis present

## 2019-03-25 DIAGNOSIS — T402X5A Adverse effect of other opioids, initial encounter: Secondary | ICD-10-CM | POA: Diagnosis present

## 2019-03-25 DIAGNOSIS — J449 Chronic obstructive pulmonary disease, unspecified: Secondary | ICD-10-CM | POA: Diagnosis present

## 2019-03-25 DIAGNOSIS — H9190 Unspecified hearing loss, unspecified ear: Secondary | ICD-10-CM | POA: Diagnosis present

## 2019-03-25 DIAGNOSIS — E860 Dehydration: Secondary | ICD-10-CM | POA: Diagnosis present

## 2019-03-25 DIAGNOSIS — I959 Hypotension, unspecified: Principal | ICD-10-CM | POA: Diagnosis present

## 2019-03-25 DIAGNOSIS — I952 Hypotension due to drugs: Secondary | ICD-10-CM | POA: Diagnosis not present

## 2019-03-25 DIAGNOSIS — N179 Acute kidney failure, unspecified: Secondary | ICD-10-CM | POA: Diagnosis present

## 2019-03-25 DIAGNOSIS — Z8673 Personal history of transient ischemic attack (TIA), and cerebral infarction without residual deficits: Secondary | ICD-10-CM | POA: Diagnosis not present

## 2019-03-25 DIAGNOSIS — I890 Lymphedema, not elsewhere classified: Secondary | ICD-10-CM | POA: Diagnosis present

## 2019-03-25 DIAGNOSIS — I10 Essential (primary) hypertension: Secondary | ICD-10-CM | POA: Diagnosis present

## 2019-03-25 DIAGNOSIS — K219 Gastro-esophageal reflux disease without esophagitis: Secondary | ICD-10-CM | POA: Diagnosis present

## 2019-03-25 DIAGNOSIS — Z79899 Other long term (current) drug therapy: Secondary | ICD-10-CM | POA: Diagnosis not present

## 2019-03-25 DIAGNOSIS — Z20828 Contact with and (suspected) exposure to other viral communicable diseases: Secondary | ICD-10-CM | POA: Diagnosis present

## 2019-03-25 DIAGNOSIS — I878 Other specified disorders of veins: Secondary | ICD-10-CM | POA: Diagnosis present

## 2019-03-25 DIAGNOSIS — Z7982 Long term (current) use of aspirin: Secondary | ICD-10-CM

## 2019-03-25 DIAGNOSIS — K5903 Drug induced constipation: Secondary | ICD-10-CM | POA: Diagnosis present

## 2019-03-25 LAB — CBC WITH DIFFERENTIAL/PLATELET
Abs Immature Granulocytes: 0.11 10*3/uL — ABNORMAL HIGH (ref 0.00–0.07)
Basophils Absolute: 0.1 10*3/uL (ref 0.0–0.1)
Basophils Relative: 1 %
Eosinophils Absolute: 0.2 10*3/uL (ref 0.0–0.5)
Eosinophils Relative: 2 %
HCT: 39.3 % (ref 39.0–52.0)
Hemoglobin: 12.4 g/dL — ABNORMAL LOW (ref 13.0–17.0)
Immature Granulocytes: 1 %
Lymphocytes Relative: 18 %
Lymphs Abs: 1.8 10*3/uL (ref 0.7–4.0)
MCH: 30.4 pg (ref 26.0–34.0)
MCHC: 31.6 g/dL (ref 30.0–36.0)
MCV: 96.3 fL (ref 80.0–100.0)
Monocytes Absolute: 0.9 10*3/uL (ref 0.1–1.0)
Monocytes Relative: 9 %
Neutro Abs: 7.1 10*3/uL (ref 1.7–7.7)
Neutrophils Relative %: 69 %
Platelets: 440 10*3/uL — ABNORMAL HIGH (ref 150–400)
RBC: 4.08 MIL/uL — ABNORMAL LOW (ref 4.22–5.81)
RDW: 12.9 % (ref 11.5–15.5)
WBC: 10.1 10*3/uL (ref 4.0–10.5)
nRBC: 0 % (ref 0.0–0.2)

## 2019-03-25 LAB — COMPREHENSIVE METABOLIC PANEL
ALT: 16 U/L (ref 0–44)
AST: 23 U/L (ref 15–41)
Albumin: 2.8 g/dL — ABNORMAL LOW (ref 3.5–5.0)
Alkaline Phosphatase: 112 U/L (ref 38–126)
Anion gap: 11 (ref 5–15)
BUN: 45 mg/dL — ABNORMAL HIGH (ref 8–23)
CO2: 28 mmol/L (ref 22–32)
Calcium: 8.5 mg/dL — ABNORMAL LOW (ref 8.9–10.3)
Chloride: 98 mmol/L (ref 98–111)
Creatinine, Ser: 2.28 mg/dL — ABNORMAL HIGH (ref 0.61–1.24)
GFR calc Af Amer: 29 mL/min — ABNORMAL LOW (ref >60)
GFR calc non Af Amer: 25 mL/min — ABNORMAL LOW (ref >60)
Glucose, Bld: 119 mg/dL — ABNORMAL HIGH (ref 70–99)
Potassium: 4.8 mmol/L (ref 3.5–5.1)
Sodium: 137 mmol/L (ref 135–145)
Total Bilirubin: 0.6 mg/dL (ref 0.3–1.2)
Total Protein: 6.9 g/dL (ref 6.5–8.1)

## 2019-03-25 LAB — PROCALCITONIN: Procalcitonin: <0.1 ng/mL

## 2019-03-25 LAB — LACTIC ACID, PLASMA: Lactic Acid, Venous: 1.2 mmol/L (ref 0.5–1.9)

## 2019-03-25 MED ORDER — SODIUM CHLORIDE 0.9 % IV SOLN
INTRAVENOUS | Status: DC
  Administered 2019-03-26 – 2019-03-27 (×4): via INTRAVENOUS

## 2019-03-25 MED ORDER — LABETALOL HCL 5 MG/ML IV SOLN: 20.0000 mg | INTRAVENOUS | Status: DC | PRN

## 2019-03-25 MED ORDER — OXYCODONE-ACETAMINOPHEN 5-325 MG PO TABS
1.0000 | ORAL_TABLET | ORAL | Status: DC | PRN
  Administered 2019-03-26 (×2): 1 via ORAL
  Filled 2019-03-25 (×2): qty 1

## 2019-03-25 MED ORDER — HYDROCHLOROTHIAZIDE 25 MG PO TABS
25.0000 mg | ORAL_TABLET | Freq: Every day | ORAL | Status: DC
  Administered 2019-03-27 – 2019-03-28 (×2): 25 mg via ORAL
  Filled 2019-03-25 (×2): qty 1

## 2019-03-25 MED ORDER — ACETAMINOPHEN 650 MG RE SUPP: 650.0000 mg | Freq: Four times a day (QID) | RECTAL | Status: DC | PRN

## 2019-03-25 MED ORDER — HYDROCODONE-ACETAMINOPHEN 5-325 MG PO TABS
1.0000 | ORAL_TABLET | Freq: Once | ORAL | Status: AC
  Administered 2019-03-25: 19:00:00 1 via ORAL
  Filled 2019-03-25: qty 1

## 2019-03-25 MED ORDER — PANTOPRAZOLE SODIUM 40 MG PO TBEC
40.0000 mg | DELAYED_RELEASE_TABLET | Freq: Every day | ORAL | Status: DC
  Administered 2019-03-26 – 2019-03-28 (×3): 40 mg via ORAL
  Filled 2019-03-25 (×3): qty 1

## 2019-03-25 MED ORDER — ONDANSETRON HCL 4 MG/2ML IJ SOLN: 4.0000 mg | Freq: Four times a day (QID) | INTRAMUSCULAR | Status: DC | PRN

## 2019-03-25 MED ORDER — SODIUM CHLORIDE 0.9 % IV BOLUS: 1000.0000 mL | Freq: Once | INTRAVENOUS | Status: DC

## 2019-03-25 MED ORDER — TRAZODONE HCL 50 MG PO TABS: 25.0000 mg | ORAL_TABLET | Freq: Every evening | ORAL | Status: DC | PRN

## 2019-03-25 MED ORDER — ACETAMINOPHEN 325 MG PO TABS: 650.0000 mg | ORAL_TABLET | Freq: Four times a day (QID) | ORAL | Status: DC | PRN

## 2019-03-25 MED ORDER — MORPHINE SULFATE (PF) 2 MG/ML IV SOLN: 1.0000 mg | INTRAVENOUS | Status: DC | PRN

## 2019-03-25 MED ORDER — ASPIRIN EC 81 MG PO TBEC
81.0000 mg | DELAYED_RELEASE_TABLET | Freq: Every day | ORAL | Status: DC
  Administered 2019-03-26 – 2019-03-28 (×3): 81 mg via ORAL
  Filled 2019-03-25 (×3): qty 1

## 2019-03-25 MED ORDER — LISINOPRIL 10 MG PO TABS: 40.0000 mg | ORAL_TABLET | Freq: Every day | ORAL | Status: DC

## 2019-03-25 MED ORDER — MAGNESIUM HYDROXIDE 400 MG/5ML PO SUSP: 30.0000 mL | Freq: Every day | ORAL | Status: DC | PRN

## 2019-03-25 MED ORDER — SODIUM CHLORIDE 0.9 % IV SOLN: Freq: Once | INTRAVENOUS | Status: DC

## 2019-03-25 MED ORDER — COLLAGENASE 250 UNIT/GM EX OINT
TOPICAL_OINTMENT | Freq: Every day | CUTANEOUS | Status: DC
  Administered 2019-03-26: 01:00:00 via TOPICAL
  Filled 2019-03-25: qty 30

## 2019-03-25 MED ORDER — SODIUM CHLORIDE 0.9 % IV BOLUS
500.0000 mL | Freq: Once | INTRAVENOUS | Status: AC
  Administered 2019-03-25: 20:00:00 500 mL via INTRAVENOUS

## 2019-03-25 MED ORDER — ONDANSETRON HCL 4 MG PO TABS: 4.0000 mg | ORAL_TABLET | Freq: Four times a day (QID) | ORAL | Status: DC | PRN

## 2019-03-25 MED ORDER — ENOXAPARIN SODIUM 40 MG/0.4ML ~~LOC~~ SOLN
40.0000 mg | SUBCUTANEOUS | Status: DC
  Administered 2019-03-25 – 2019-03-27 (×3): 40 mg via SUBCUTANEOUS
  Filled 2019-03-25 (×4): qty 0.4

## 2019-03-25 NOTE — ED Triage Notes (Signed)
Pt arruves via EMS from [facility] for hypotension- per the staff the pt had a manual bp of 80/40- EMS got a manual bp of 124/62- per staff, pt has been "bleeding a lot" from his cellulitis on his left leg- pt denies any complaints

## 2019-03-25 NOTE — ED Provider Notes (Signed)
[facility] Emergency Department Provider Note  ____________________________________________   First MD Initiated Contact with Patient 03/25/19 1657     (approximate)  I have reviewed the triage vital signs and the nursing notes.   HISTORY  Chief Complaint Hypotension    HPI Steven Meza. is a 85 y.o. male  Here with  leg pain, hypotension. Pt was just hospitalized here for cellulitis L leg, d/c to facility. He has been at [facility] and reports he has nto been eating/drinking much 2/2 the food being bad. He had a BP checked today which showed markedly decreased BP, so he was sent here. It was in the [id number] systolic. He reports mild lightheadedness but no other sx at the time. No CP. He reports his left leg pain and swelling has been constant, slowly improving. No alleviating factors. He feels the redness is improving.       Past Medical History:  Diagnosis Date  . Cellulitis   . HBP (high blood pressure)   . Hearing loss   . Lymphedema   . Sleep apnea   . Stroke [facility])     Patient Active Problem List   Diagnosis Date Noted  . Sepsis ([place]) 03/08/2019  . AKI (acute kidney injury) ([place]) 03/08/2019  . Cellulitis of left lower extremity 03/08/2019  . Cellulitis of left leg 03/08/2019  . Pain due to onychomycosis of toenails of both feet 10/23/2018  . Lymphedema 04/19/2018  . COPD (chronic obstructive pulmonary disease) ([place]) 04/19/2018  . Diverticulosis 04/19/2018  . History of colonic polyps 04/19/2018  . Hypertension 04/19/2018  . Morbid obesity ([place]) 04/19/2018  . Osteoarthritis 04/19/2018  . Sleep apnea 04/19/2018  . Stroke ([place]) 04/19/2018  . Venous stasis 04/19/2018  . History of bladder cancer 02/20/2013  . Increased frequency of urination 02/20/2013  . Malignant neoplasm of other specified sites of bladder 02/20/2013  . Microscopic hematuria 02/20/2013    Past Surgical History:  Procedure Laterality Date  . BLADDER SURGERY    . KNEE  SURGERY Right     Prior to Admission medications   Medication Sig Start Date End Date Taking? Authorizing Provider  aspirin 81 MG tablet Take 81 mg by mouth daily.    [provider]  collagenase (SANTYL) ointment Apply topically daily. 03/13/19   Annita Brod, MD  hydrochlorothiazide (HYDRODIURIL) 25 MG tablet Take 25 mg by mouth daily. 01/16/18   [provider]  lisinopril (PRINIVIL,ZESTRIL) 20 MG tablet Take 40 mg by mouth daily. 01/16/18   [provider]  omeprazole (PRILOSEC) 40 MG capsule Take 40 mg by mouth daily.     [provider]    Allergies Patient has no known allergies.  No family history on file.  Social History Social History   Tobacco Use  . Smoking status: Former Smoker    Quit date: 02/28/1997    Years since quitting: 22.0  . Smokeless tobacco: Never Used  Substance Use Topics  . Alcohol use: Yes    Alcohol/week: 1.0 standard drinks    Types: 1 Shots of liquor per week    Comment: OCCASIONALLY  . Drug use: No    Review of Systems  Review of Systems  Constitutional: Positive for fatigue. Negative for chills and fever.  HENT: Negative for sore throat.   Respiratory: Negative for shortness of breath.   Cardiovascular: Negative for chest pain.  Gastrointestinal: Negative for abdominal pain.  Genitourinary: Negative for flank pain.  Musculoskeletal: Negative for neck pain.  Skin: Negative for rash and wound.  Allergic/Immunologic: Negative for immunocompromised state.  Neurological: Positive for weakness and light-headedness. Negative for numbness.  Hematological: Does not bruise/bleed easily.  All other systems reviewed and are negative.    ____________________________________________  PHYSICAL EXAM:      VITAL SIGNS: ED Triage Vitals  Enc Vitals Group     BP 03/25/19 1702 (!) 106/59     Pulse Rate 03/25/19 1702 75     Resp 03/25/19 1702 18     Temp 03/25/19 1702 98.1 F (36.7 C)     Temp Source  03/25/19 1702 Oral     SpO2 03/25/19 1702 99 %     Weight 03/25/19 1659 233 lb (105.7 kg)     Height 03/25/19 1659 5\' 5"  (1.651 m)     Head Circumference --      Peak Flow --      Pain Score 03/25/19 1659 0     Pain Loc --      Pain Edu? --      Excl. in [place]? --      Physical Exam Vitals signs and nursing note reviewed.  Constitutional:      General: He is not in acute distress.    Appearance: He is well-developed.  HENT:     Head: Normocephalic and atraumatic.     Mouth/Throat:     Mouth: Mucous membranes are dry.  Eyes:     Conjunctiva/sclera: Conjunctivae normal.  Neck:     Musculoskeletal: Neck supple.  Cardiovascular:     Rate and Rhythm: Normal rate and regular rhythm.     Heart sounds: Normal heart sounds.  Pulmonary:     Effort: Pulmonary effort is normal. No respiratory distress.     Breath sounds: No wheezing.  Abdominal:     General: There is no distension.  Musculoskeletal:     Right lower leg: Edema present.     Left lower leg: Edema present.  Skin:    General: Skin is warm.     Capillary Refill: Capillary refill takes less than 2 seconds.     Findings: No rash.  Neurological:     Mental Status: He is alert and oriented to person, place, and time.     Motor: No abnormal muscle tone.      LOWER EXTREMITY EXAM: LEFT  INSPECTION & PALPATION: Superficial ulceration to left lower leg, circumferential, with intact beefy red base with granulation tissue. Chronic red skin darkening but no overt warmth or fluctuance.  SENSORY: sensation is intact to light touch in:  Superficial peroneal nerve distribution (over dorsum of foot) Deep peroneal nerve distribution (over first dorsal web space) Sural nerve distribution (over lateral aspect 5th metatarsal) Saphenous nerve distribution (over medial instep)  MOTOR:  + Motor EHL (great toe dorsiflexion) + FHL (great toe plantar flexion)  + TA (ankle dorsiflexion)  + GSC (ankle plantar flexion)  VASCULAR: 2+  dorsalis pedis and posterior tibialis pulses Capillary refill < 2 sec, toes warm and well-perfused  COMPARTMENTS: Soft, warm, well-perfused No pain with passive extension No parethesias         ____________________________________________   LABS (all labs ordered are listed, but only abnormal results are displayed)  Labs Reviewed  CBC WITH DIFFERENTIAL/PLATELET - Abnormal; Notable for the following components:      Result Value   RBC 4.08 (*)    Hemoglobin 12.4 (*)    Platelets 440 (*)    Abs Immature Granulocytes 0.11 (*)  All other components within normal limits  COMPREHENSIVE METABOLIC PANEL - Abnormal; Notable for the following components:   Glucose, Bld 119 (*)    BUN 45 (*)    Creatinine, Ser 2.28 (*)    Calcium 8.5 (*)    Albumin 2.8 (*)    GFR calc non Af Amer 25 (*)    GFR calc Af Amer 29 (*)    All other components within normal limits  CULTURE, BLOOD (ROUTINE X 2)  CULTURE, BLOOD (ROUTINE X 2)  SARS CORONAVIRUS 2 (TAT 6-24 HRS)  LACTIC ACID, PLASMA  PROCALCITONIN  CBC  BASIC METABOLIC PANEL  CBC    ____________________________________________  EKG: Sinus rhythm, VR 76. QRS 164, QTc 503. LBBB, no acute Sgar[place]ossa criteria. ________________________________________  RADIOLOGY All imaging, including plain films, CT scans, and ultrasounds, independently reviewed by me, and interpretations confirmed via formal radiology reads.  ED MD interpretation:   None  Official radiology report(s): No results found.  ____________________________________________  PROCEDURES   Procedure(s) performed (including Critical Care):  Procedures  ____________________________________________  INITIAL IMPRESSION / MDM / [place] / ED COURSE  As part of my medical decision making, I reviewed the following data within the [place] notes reviewed and incorporated, Old chart reviewed, Notes from prior ED visits, and [place]  Controlled Substance [place] Quaveon Zavada. was evaluated in Emergency Department on 03/26/2019 for the symptoms described in the history of present illness. He was evaluated in the context of the global COVID-19 pandemic, which necessitated consideration that the patient might be at risk for infection with the SARS-CoV-2 virus that causes COVID-19. Institutional protocols and algorithms that pertain to the evaluation of patients at risk for COVID-19 are in a state of rapid change based on information released by regulatory bodies including the CDC and federal and state organizations. These policies and algorithms were followed during the patient's care in the ED.  Some ED evaluations and interventions may be delayed as a result of limited staffing during the pandemic.*     Medical Decision Making:  85 yo M here with weakness, hypotension in setting of recent admission for cellulitis. Pt appears dry clinically and lab work today shows AKI, likely 2/2 poor PO intake. He is afebrile, denies any fevers and the LLE appears somewhat improved from prior when compared to images, though he does have an ongoing wound. Procal neg, no leukocytosis, LA normal makes recurrent cellulitis/infection unlikely. Will start IVF, admit.   ____________________________________________  FINAL CLINICAL IMPRESSION(S) / ED DIAGNOSES  Final diagnoses:  AKI (acute kidney injury) ([place])     MEDICATIONS GIVEN DURING THIS VISIT:  Medications  aspirin EC tablet 81 mg (81 mg Oral Not Given 03/25/19 2212)  hydrochlorothiazide (HYDRODIURIL) tablet 25 mg (has no administration in time range)  lisinopril (ZESTRIL) tablet 40 mg (has no administration in time range)  pantoprazole (PROTONIX) EC tablet 40 mg (40 mg Oral Not Given 03/25/19 2212)  collagenase (SANTYL) ointment ( Topical Given 03/26/19 0045)  enoxaparin (LOVENOX) injection 40 mg (40 mg Subcutaneous Given 03/25/19 2212)  0.9 %  sodium chloride infusion (  Intravenous New Bag/Given 03/26/19 0035)  acetaminophen (TYLENOL) tablet 650 mg (has no administration in time range)    Or  acetaminophen (TYLENOL) suppository 650 mg (has no administration in time range)  traZODone (DESYREL) tablet 25 mg (has no administration in time range)  magnesium hydroxide (MILK OF MAGNESIA) suspension 30 mL (has no administration in  time range)  ondansetron (ZOFRAN) tablet 4 mg (has no administration in time range)    Or  ondansetron (ZOFRAN) injection 4 mg (has no administration in time range)  oxyCODONE-acetaminophen (PERCOCET/ROXICET) 5-325 MG per tablet 1 tablet (1 tablet Oral Given 03/26/19 0102)  morphine 2 MG/ML injection 1 mg (has no administration in time range)  labetalol (NORMODYNE) injection 20 mg (has no administration in time range)  sodium chloride 0.9 % bolus 500 mL (0 mLs Intravenous Stopped 03/25/19 2105)  HYDROcodone-acetaminophen (NORCO/VICODIN) 5-325 MG per tablet 1 tablet (1 tablet Oral Given 03/25/19 1925)     ED Discharge Orders    None       Note:  This document was prepared using Dragon voice recognition software and may include unintentional dictation errors.   Duffy Bruce, MD 03/26/19 [phone]

## 2019-03-25 NOTE — ED Notes (Addendum)
Redrawn lactic sent to lab

## 2019-03-25 NOTE — H&P (Signed)
at [place] NAME: Steven Meza    MR#:  [id number]  DATE OF BIRTH:  06-25-1930  DATE OF ADMISSION:  03/25/2019  PRIMARY CARE PHYSICIAN: Idelle Crouch, MD   REQUESTING/REFERRING PHYSICIAN: Duffy Bruce, MD  CHIEF COMPLAINT:   Chief Complaint  Patient presents with  . Hypotension    HISTORY OF PRESENT ILLNESS:  Steven Meza  is a 85 y.o. Caucasian male with a known history of lower extremity cellulitis for which she was recently admitted here and discharged to skilled nursing facility for rehab, as well as hypertension, lymphedema and obstructive sleep apnea as well as CVA, who presented to the emergency room with acute onset of anorexia and hypotension.  He had a reported systolic blood pressure in the 80s at his SNF.  He was having associated lightheadedness.  He admitted to having low appetite there.  He stated that he keeps eating the same thing over and over, namely chicken pork chops and hamburgers.  He denied any chest pain or palpitations.  No nausea or vomiting or diarrhea or abdominal pain.  No cough or wheezing or fever or chills.  No dysuria, oliguria or hematuria or flank pain.  Upon presentation to the emergency room, the patient's blood pressure 106/59 with otherwise normal vital signs.  Labs revealed potassium of 4.8 and BUN of 45 and creatinine 2.28 compared to 20 and 0.9 on 03/11/2019.  CBC was unremarkable.  Procalcitonin was less than 0.1.  EKG showed normal sinus rhythm with rate of 76 with prolonged PR interval and left bundle branch block.  The patient was given 500 mill IV normal saline bolus then 100 mL/h as well as 1 p.o. Norco.  He will be admitted to a medical monitored bed for further evaluation and management. PAST MEDICAL HISTORY:   Past Medical History:  Diagnosis Date  . Cellulitis   . HBP (high blood pressure)   . Hearing loss   . Lymphedema   . Sleep apnea   . Stroke [facility])   COPD Onychomycosis of  toenails GERD PAST SURGICAL HISTORY:   Past Surgical History:  Procedure Laterality Date  . BLADDER SURGERY    . KNEE SURGERY Right     SOCIAL HISTORY:   Social History   Tobacco Use  . Smoking status: Former Smoker    Quit date: 02/28/1997    Years since quitting: 22.0  . Smokeless tobacco: Never Used  Substance Use Topics  . Alcohol use: Yes    Alcohol/week: 1.0 standard drinks    Types: 1 Shots of liquor per week    Comment: OCCASIONALLY    FAMILY HISTORY:  No family history on file.  DRUG ALLERGIES:  No Known Allergies  REVIEW OF SYSTEMS:   ROS As per history of present illness. All pertinent systems were reviewed above. Constitutional,  HEENT, cardiovascular, respiratory, GI, GU, musculoskeletal, neuro, psychiatric, endocrine,  integumentary and hematologic systems were reviewed and are otherwise  negative/unremarkable except for positive findings mentioned above in the HPI.   MEDICATIONS AT HOME:   Prior to Admission medications   Medication Sig Start Date End Date Taking? Authorizing Provider  aspirin 81 MG tablet Take 81 mg by mouth daily.    [provider]  collagenase (SANTYL) ointment Apply topically daily. 03/13/19   Annita Brod, MD  hydrochlorothiazide (HYDRODIURIL) 25 MG tablet Take 25 mg by mouth daily. 01/16/18   [provider]  lisinopril (PRINIVIL,ZESTRIL) 20 MG tablet Take 40 mg  by mouth daily. 01/16/18   [provider]  omeprazole (PRILOSEC) 40 MG capsule Take 40 mg by mouth daily.     [provider]      VITAL SIGNS:  Blood pressure 130/69, pulse 83, temperature 98.1 F (36.7 C), temperature source Oral, resp. rate 18, height 5\' 5"  (1.651 m), weight 105.7 kg, SpO2 99 %.  PHYSICAL EXAMINATION:  Physical Exam  GENERAL:  85 y.o.-year-old Caucasian male patient lying in the bed with no acute distress.  EYES: Pupils equal, round, reactive to light and accommodation. No scleral icterus.  Extraocular muscles intact.  HEENT: Head atraumatic, normocephalic. Oropharynx and nasopharynx clear.  NECK:  Supple, no jugular venous distention. No thyroid enlargement, no tenderness.  LUNGS: Normal breath sounds bilaterally, no wheezing, rales,rhonchi or crepitation. No use of accessory muscles of respiration.  CARDIOVASCULAR: Regular rate and rhythm, S1, S2 normal. No murmurs, rubs, or gallops.  ABDOMEN: Soft, nondistended, nontender. Bowel sounds present. No organomegaly or mass.  EXTREMITIES: No pedal edema, cyanosis, or clubbing.  NEUROLOGIC: Cranial nerves II through XII are intact. Muscle strength 5/5 in all extremities. Sensation intact. Gait not checked.  PSYCHIATRIC: The patient is alert and oriented x 3.  Normal affect and good eye contact. SKIN: Left lower extremity large healing wound with clean erythematous base.       LABORATORY PANEL:   CBC Recent Labs  Lab 03/25/19 1704  WBC 10.1  HGB 12.4*  HCT 39.3  PLT 440*   ------------------------------------------------------------------------------------------------------------------  Chemistries  Recent Labs  Lab 03/25/19 1704  NA 137  K 4.8  CL 98  CO2 28  GLUCOSE 119*  BUN 45*  CREATININE 2.28*  CALCIUM 8.5*  AST 23  ALT 16  ALKPHOS 112  BILITOT 0.6   ------------------------------------------------------------------------------------------------------------------  Cardiac Enzymes No results for input(s): TROPONINI in the last 168 hours. ------------------------------------------------------------------------------------------------------------------  RADIOLOGY:  No results found.    IMPRESSION AND PLAN:   1.  Acute kidney injury likely prerenal secondary to anorexia and volume depletion with hypotension dehydration.  The patient will be admitted to a medical monitored bed.  He will be placed on continued hydration with IV normal saline and will follow BMP.  Dietitian consult to be obtained  given his anorexia.  We will hold off lisinopril and HCTZ.  Case management consult will be obtained for discharge planning.  2.  Hypotension with history of hypertension.  This is likely the culprit for his acute kidney injury.  We will place the patient on as needed IV labetalol and hold off lisinopril and HCTZ.  3.  Recent left lower extremity cellulitis with current residual clean-looking healing wound.  Wound care consult to be obtained.  We will continue with Santyl ointment.  4.  GERD.  We will continue PPI therapy.  5.  History of stroke.  We will continue enteric-coated baby aspirin.  6.  Obstructive sleep apnea.  We will continue CPAP nightly.  7.  DVT prophylaxis.  Subtenons Lovenox.   All the records are reviewed and case discussed with ED provider. The plan of care was discussed in details with the patient (and family). I answered all questions. The patient agreed to proceed with the above mentioned plan. Further management will depend upon hospital course.   CODE STATUS: Full code  TOTAL TIME TAKING CARE OF THIS PATIENT: 50 minutes.    Christel Mormon M.D on 03/25/2019 at 8:55 PM  [facility]   From 7 PM-7 AM, contact night-coverage [email or web address]  CC: Primary  care physician; Idelle Crouch, MD   Note: This dictation was prepared with Dragon dictation along with smaller phrase technology. Any transcriptional errors that result from this process are unintentional.

## 2019-03-25 NOTE — ED Notes (Signed)
Wife leaving bedside to go home; Corporate investment banker at [employer] called and told plan for admission

## 2019-03-26 DIAGNOSIS — I890 Lymphedema, not elsewhere classified: Secondary | ICD-10-CM

## 2019-03-26 DIAGNOSIS — I878 Other specified disorders of veins: Secondary | ICD-10-CM

## 2019-03-26 DIAGNOSIS — I959 Hypotension, unspecified: Principal | ICD-10-CM

## 2019-03-26 LAB — BASIC METABOLIC PANEL
Anion gap: 9 (ref 5–15)
BUN: 40 mg/dL — ABNORMAL HIGH (ref 8–23)
CO2: 26 mmol/L (ref 22–32)
Calcium: 8.2 mg/dL — ABNORMAL LOW (ref 8.9–10.3)
Chloride: 100 mmol/L (ref 98–111)
Creatinine, Ser: 1.42 mg/dL — ABNORMAL HIGH (ref 0.61–1.24)
GFR calc Af Amer: 51 mL/min — ABNORMAL LOW (ref >60)
GFR calc non Af Amer: 44 mL/min — ABNORMAL LOW (ref >60)
Glucose, Bld: 98 mg/dL (ref 70–99)
Potassium: 4.2 mmol/L (ref 3.5–5.1)
Sodium: 135 mmol/L (ref 135–145)

## 2019-03-26 LAB — CBC
HCT: 37.9 % — ABNORMAL LOW (ref 39.0–52.0)
Hemoglobin: 12.5 g/dL — ABNORMAL LOW (ref 13.0–17.0)
MCH: 30.3 pg (ref 26.0–34.0)
MCHC: 33 g/dL (ref 30.0–36.0)
MCV: 92 fL (ref 80.0–100.0)
Platelets: 379 10*3/uL (ref 150–400)
RBC: 4.12 MIL/uL — ABNORMAL LOW (ref 4.22–5.81)
RDW: 12.9 % (ref 11.5–15.5)
WBC: 9.6 10*3/uL (ref 4.0–10.5)
nRBC: 0 % (ref 0.0–0.2)

## 2019-03-26 LAB — LACTIC ACID, PLASMA: Lactic Acid, Venous: 1.1 mmol/L (ref 0.5–1.9)

## 2019-03-26 LAB — SARS CORONAVIRUS 2 (TAT 6-24 HRS): SARS Coronavirus 2: NEGATIVE

## 2019-03-26 LAB — GLUCOSE, CAPILLARY: Glucose-Capillary: 92 mg/dL (ref 70–99)

## 2019-03-26 MED ORDER — OXYCODONE HCL 5 MG PO TABS
2.5000 mg | ORAL_TABLET | Freq: Four times a day (QID) | ORAL | Status: DC | PRN
  Administered 2019-03-26 – 2019-03-27 (×2): 2.5 mg via ORAL
  Filled 2019-03-26 (×2): qty 1

## 2019-03-26 MED ORDER — POLYETHYLENE GLYCOL 3350 17 G PO PACK
17.0000 g | PACK | Freq: Every day | ORAL | Status: DC
  Filled 2019-03-26 (×2): qty 1

## 2019-03-26 MED ORDER — SODIUM CHLORIDE 0.9 % IV BOLUS
500.0000 mL | Freq: Once | INTRAVENOUS | Status: AC
  Administered 2019-03-26: 500 mL via INTRAVENOUS

## 2019-03-26 NOTE — Care Management (Signed)
ED RN CM: Call to patients spouse requesting plans for discharge. Wife states pt will return to [facility] at discharge to continue therapy and wound care as she cannot take care of him until he meets those goals of independence.  Call to [facility] to confirm plans for patient to return to SNF after discharge. LVMM updating and requesting callback to ensure patient is okay to return to SNF.

## 2019-03-26 NOTE — TOC Initial Note (Signed)
Transition of Care (TOC) - Initial/Assessment Note    Patient Details  Name: Steven Meza. MRN: [id number] Date of Birth: Sep 26, 1930  Transition of Care [facility]) CM/SW Contact:    Anselm Pancoast, RN Phone Number: 03/26/2019, 3:20 PM  Clinical Narrative:                 85 year old male very hard of hearing admitted with cellulitis. States he has strong family support with his wife and daughter assisting in his care. Had home health nurse for a while and spent some time in [place] for rehab and wound care. Patient states his wound are getting much better and he is eager to get home as soon as possible. Has wheelchair and walker at home. Suggested RN CM outreach to his daughter Lorenda Ishihara with any questions or concerns.   Expected Discharge Plan: [place]     Patient Goals and CMS Choice Patient states their goals for this hospitalization and ongoing recovery are:: Get back home to his family      Expected Discharge Plan and Services Expected Discharge Plan: [place]       Living arrangements for the past 2 months: [place], [place]                                      Prior Living Arrangements/Services Living arrangements for the past 2 months: [place], [place] Lives with:: Spouse, Adult Children Patient language and need for interpreter reviewed:: Yes Do you feel safe going back to the place where you live?: Yes      Need for Family Participation in Patient Care: Yes (Comment) Care giver support system in place?: Yes (comment) Current home services: DME, Home RN Criminal Activity/Legal Involvement Pertinent to Current Situation/Hospitalization: No - Comment as needed  Activities of Daily Living      Permission Sought/Granted Permission sought to share information with : Case Manager, Customer service manager, Family Supports Permission granted to share  information with : Yes, Verbal Permission Granted  Share Information with NAME: Berenice Bouton     Permission granted to share info w Relationship: Daughter     Emotional Assessment Appearance:: Appears stated age Attitude/Demeanor/Rapport: Engaged Affect (typically observed): Accepting Orientation: : Oriented to Self, Oriented to Place, Oriented to  Time, Oriented to Situation Alcohol / Substance Use: Alcohol Use, Tobacco Use(Drinks Bourbon 4x/month. Quit smoking 15 years ago) Psych Involvement: No (comment)  Admission diagnosis:  Cellulitis Patient Active Problem List   Diagnosis Date Noted  . Hypotension 03/26/2019  . AKI (acute kidney injury) ([place]) 03/08/2019  . Pain due to onychomycosis of toenails of both feet 10/23/2018  . Lymphedema 04/19/2018  . COPD (chronic obstructive pulmonary disease) ([place]) 04/19/2018  . Diverticulosis 04/19/2018  . History of colonic polyps 04/19/2018  . Hypertension 04/19/2018  . Morbid obesity ([place]) 04/19/2018  . Osteoarthritis 04/19/2018  . Sleep apnea 04/19/2018  . Venous stasis 04/19/2018  . History of bladder cancer 02/20/2013  . Increased frequency of urination 02/20/2013  . Malignant neoplasm of other specified sites of bladder 02/20/2013  . Microscopic hematuria 02/20/2013   PCP:  Idelle Crouch, MD Pharmacy:   [facility] [address] [place] [address] [place]) [place] [address] Phone: [phone] Fax: [phone]     Social  Determinants of Health (SDOH) Interventions    Readmission Risk Interventions No flowsheet data found.

## 2019-03-26 NOTE — Progress Notes (Signed)
PROGRESS NOTE  Steven Meza. [id number] DOB: [birth year] DOA: 03/25/2019 PCP: Idelle Crouch, MD  HPI/Recap of past 24 hours: Patient is an 85 year old male known to me from his previous admission 2 weeks ago after being admitted for a lower extremity cellulitis and being discharged to a skilled nursing facility.  He was sent over from skilled nursing after having several days of anorexia and noted to be hypotensive and then sent to the emergency room on evening of 11/22.  Patient found to have creatinine of 2.28 (was normal 2 weeks prior) and was hypotensive with blood pressure initially in the 100s.  Patient was given IV fluids and admitted to hospital service.  Following admission, starting around 630 this morning, patient's blood pressure trending downwards as low as mid 0000000 systolic by 8 AM.  Patient somnolent, but able to be awoken and was appropriate.  He just tells me that all he wants to do is sleep he has not gotten any rest for some time.  Tells me he has no appetite.  He is also unable to have the last time he had a bowel movement.  Assessment/Plan: Active Problems:   Lymphedema/venous stasis: No evidence of acute cellulitis.  Seen by wound care.  Appreciate their recommendations.    COPD (chronic obstructive pulmonary disease) ([place]): Stable.    Hypertension/hypotension: Suspect because of this is poor p.o. intake and also from his pain medications.  Will decrease frequency and strength of his pain medications, may end up discontinuing them altogether.  Continue fluid resuscitation.    Morbid obesity [facility]): Patient meets criteria with BMI greater than 40.    AKI (acute kidney injury) ([place]): Secondary to hypotension.  Improved significantly with IV fluids.  Anorexia: Suspect part of his hypotension is due to poor p.o. intake, likely anorexia from opioid-induced constipation.  Have started MiraLAX.  If this does not work, will try dose of lactulose.  Code Status: Full  code  Family Communication: Left message for wife  Disposition Plan: Potential return back to skilled nursing tomorrow if stays awake, tolerating p.o., has bowel movement and renal function back to normal   Consultants:  None  Procedures:  None  Antimicrobials:  None  DVT prophylaxis: Lovenox   Objective: Vitals:   03/26/19 1200 03/26/19 1230  BP: (!) 119/57 93/77  Pulse: 76 83  Resp: 15 17  Temp:    SpO2: 100% 97%    Intake/Output Summary (Last 24 hours) at 03/26/2019 1457 Last data filed at 03/26/2019 1341 Gross per 24 hour  Intake 2254.85 ml  Output 300 ml  Net 1954.85 ml   Filed Weights   03/25/19 1659  Weight: 105.7 kg   Body mass index is 38.77 kg/m.  Exam:   General: Somnolent, but oriented x3, no acute distress  HEENT: Atraumatic, mucous membranes slightly dry  Neck: Thick, narrow airway  Cardiovascular: Regular rate and rhythm, S1-S2  Respiratory: Clear to auscultation bilaterally  Abdomen: Soft, nontender, obese, hypoactive bowel sounds  Musculoskeletal: Trace to 1+ pitting edema bilaterally  Skin: Chronic lower extremity venous stasis with signs of skin discoloration from lymphedema, left greater than right  Psychiatry: Somnolent, but otherwise appropriate, no evidence of psychoses.  Neuro: No focal deficits   Data Reviewed: CBC: Recent Labs  Lab 03/25/19 1704 03/26/19 1105  WBC 10.1 9.6  NEUTROABS 7.1  --   HGB 12.4* 12.5*  HCT 39.3 37.9*  MCV 96.3 92.0  PLT 440* [id number]   Basic Metabolic Panel: Recent Labs  Lab 03/25/19 1704 03/26/19 1105  NA 137 135  K 4.8 4.2  CL 98 100  CO2 28 26  GLUCOSE 119* 98  BUN 45* 40*  CREATININE 2.28* 1.42*  CALCIUM 8.5* 8.2*   GFR: Estimated Creatinine Clearance: 41.8 mL/min (A) (by C-G formula based on SCr of 1.42 mg/dL (H)). Liver Function Tests: Recent Labs  Lab 03/25/19 1704  AST 23  ALT 16  ALKPHOS 112  BILITOT 0.6  PROT 6.9  ALBUMIN 2.8*   No results for  input(s): LIPASE, AMYLASE in the last 168 hours. No results for input(s): AMMONIA in the last 168 hours. Coagulation Profile: No results for input(s): INR, PROTIME in the last 168 hours. Cardiac Enzymes: No results for input(s): CKTOTAL, CKMB, CKMBINDEX, TROPONINI in the last 168 hours. BNP (last 3 results) No results for input(s): PROBNP in the last 8760 hours. HbA1C: No results for input(s): HGBA1C in the last 72 hours. CBG: Recent Labs  Lab 03/26/19 1107  GLUCAP 92   Lipid Profile: No results for input(s): CHOL, HDL, LDLCALC, TRIG, CHOLHDL, LDLDIRECT in the last 72 hours. Thyroid Function Tests: No results for input(s): TSH, T4TOTAL, FREET4, T3FREE, THYROIDAB in the last 72 hours. Anemia Panel: No results for input(s): VITAMINB12, FOLATE, FERRITIN, TIBC, IRON, RETICCTPCT in the last 72 hours. Urine analysis:    Component Value Date/Time   COLORURINE AMBER (A) 03/08/2019 1519   APPEARANCEUR HAZY (A) 03/08/2019 1519   LABSPEC 1.024 03/08/2019 1519   PHURINE 5.0 03/08/2019 1519   GLUCOSEU NEGATIVE 03/08/2019 1519   HGBUR NEGATIVE 03/08/2019 1519   BILIRUBINUR NEGATIVE 03/08/2019 1519   KETONESUR NEGATIVE 03/08/2019 1519   PROTEINUR NEGATIVE 03/08/2019 1519   NITRITE NEGATIVE 03/08/2019 1519   LEUKOCYTESUR NEGATIVE 03/08/2019 1519   Sepsis Labs: @LABRCNTIP (procalcitonin:4,lacticidven:4)  ) Recent Results (from the past 240 hour(s))  Blood culture (routine x 2)     Status: None (Preliminary result)   Collection Time: 03/25/19  7:29 PM   Specimen: BLOOD  Result Value Ref Range Status   Specimen Description BLOOD LEFT HAND  Final   Special Requests   Final    BOTTLES DRAWN AEROBIC AND ANAEROBIC Blood Culture adequate volume   Culture   Final    NO GROWTH < 12 HOURS Performed at [facility], [address] [place], [place] [address]    Report Status PENDING  Incomplete  Blood culture (routine x 2)     Status: None (Preliminary result)   Collection Time:  03/25/19  7:29 PM   Specimen: BLOOD  Result Value Ref Range Status   Specimen Description BLOOD RIGHT FOREARM  Final   Special Requests   Final    BOTTLES DRAWN AEROBIC AND ANAEROBIC Blood Culture adequate volume   Culture   Final    NO GROWTH < 12 HOURS Performed at [facility], [address] [place], [place] [address]    Report Status PENDING  Incomplete  SARS CORONAVIRUS 2 (TAT 6-24 HRS) Nasopharyngeal Nasopharyngeal Swab     Status: None   Collection Time: 03/26/19  3:27 AM   Specimen: Nasopharyngeal Swab  Result Value Ref Range Status   SARS Coronavirus 2 NEGATIVE NEGATIVE Final    Comment: (NOTE) SARS-CoV-2 target nucleic acids are NOT DETECTED. The SARS-CoV-2 RNA is generally detectable in upper and lower respiratory specimens during the acute phase of infection. Negative results do not preclude SARS-CoV-2 infection, do not rule out co-infections with other pathogens, and should not be used as the sole basis for  treatment or other patient management decisions. Negative results must be combined with clinical observations, patient history, and epidemiological information. The expected result is Negative. Fact Sheet for Patients: SugarRoll.be Fact Sheet for Healthcare Providers: https://www.woods-mathews.com/ This test is not yet approved or cleared by the [place] FDA and  has been authorized for detection and/or diagnosis of SARS-CoV-2 by FDA under an Emergency Use Authorization (EUA). This EUA will remain  in effect (meaning this test can be used) for the duration of the COVID-19 declaration under Section 56 4(b)(1) of the Act, 21 U.S.C. section 360bbb-3(b)(1), unless the authorization is terminated or revoked sooner. Performed at [facility], [place] [address], [place], [place] [address]       Studies: No results found.  Scheduled Meds: . aspirin EC  81 mg Oral Daily  . enoxaparin (LOVENOX)  injection  40 mg Subcutaneous Q24H  . hydrochlorothiazide  25 mg Oral Daily  . pantoprazole  40 mg Oral Daily  . polyethylene glycol  17 g Oral Daily    Continuous Infusions: . sodium chloride 100 mL/hr at 03/26/19 1341     LOS: 1 day     Annita Brod, MD [facility]  To reach me or the doctor on call, go to: [email or web address] Password [facility]  03/26/2019, 2:57 PM

## 2019-03-26 NOTE — ED Notes (Signed)
This RN called patient's daughter Otho Perl [phone] and notified her that patient had been transferred to a bed upstairs.

## 2019-03-26 NOTE — ED Notes (Signed)
Wound care provided to patient at this time by this RN. Pt wound cleansed, 5 pieces of xeroform gauze placed, 4 abdominal pads placed over xeroform gauze, 1 6inch Kerlix, 1 6in Ace wrap placed over kerlix, and 1 Prevalon boot placed to LLE. Serosanguinous drainage noted to LLE at this time. Pt tolerated well. Will continue to monitor.

## 2019-03-26 NOTE — ED Notes (Signed)
This RN to bedside due to patient calling out, upon this RN arrival to bedside pt noted to be asleep, VSS, respirations even and unlabored. Will continue to monitor for further patient needs.

## 2019-03-26 NOTE — Consult Note (Addendum)
WOC Nurse Consult Note: Reason for Consult: Chronic wounds to left LE related to venous insufficiency.  Medial > Lateral or Anterior.  [place] of reepithelialization.  Chronic recurring cellulitis and pain. Seen during his last admission by my partner, S. Tora Perches on 03/09/19.  Santyl initiated at that time is no longer needed as wounds present clean today with 100% red tissue in wound bed, so I will discontinue.  See photos obtained by Dr. Crissie Reese last evening. Wound type:Venous insufficiency/lymphedema Pressure Injury POA: N/A Measurement: to be obtained today by bedside RN and documented on the Flow Sheet Wound bed: 100% red, moist Drainage (amount, consistency, odor) moderate when LE is in a dependent position Periwound: Dry heels (particularly left medial heel), edema, erythema Dressing procedure/placement/frequency: Unna's boots are being used in the community.  Here we will need more frequent observation in the presence of infection, so twice daily application of an antimicrobial dressing (xeroform gauze) topped with ABD pads and secured with Kerlix roll gauze/paper tape and topped with an ACE bandage for compression will be performed. Elevation when out of hospital seems to be a challenge, so while in house, we will place foot into a Prevalon boot for pressure injury prevention and elevation both while in bed and in chair.  Patient to continue with the outpatient [facility] and with [facility] upon discharge.  It should be noted that toe-to-knee compression is ineffective with long-term management of lymphedema; manual massage (by PT until caregivers can be taught) followed by routine application of compression pumps in the home setting are required to move fluid to groin for lymphatic drainage.  [place] nursing team will not follow, but will remain available to this patient, the nursing and medical teams.  Please re-consult if needed. Thanks, Maudie Flakes, MSN, RN, [place], Arther Abbott  Pager# [phone]

## 2019-03-26 NOTE — ED Notes (Signed)
Pt given lunch tray and cup of coffee.

## 2019-03-26 NOTE — ED Notes (Signed)
Pt transferred to hospital bed this RN. Pt's daughter at bedside at this time. Pt's daughter given update by this RN at this time. Pt tolerated transfer to hospital bed well. NAD noted. Will continue to monitor for further patient needs.

## 2019-03-26 NOTE — ED Notes (Signed)
Call bell light answered; pt requesting pain meds, bedding straightened pt on left side, left lower leg elevated

## 2019-03-26 NOTE — ED Notes (Signed)
This RN to bedside, pt resting in bed, pt given urinal at this time. Pt continues to rest in bed with NAD noted. Will continue to monitor for further patient needs.

## 2019-03-26 NOTE — ED Notes (Signed)
Pt given meal but refuses at this time, states "I just want to lay here for a minute and rest".

## 2019-03-27 DIAGNOSIS — K5903 Drug induced constipation: Secondary | ICD-10-CM | POA: Diagnosis present

## 2019-03-27 DIAGNOSIS — T402X5A Adverse effect of other opioids, initial encounter: Secondary | ICD-10-CM

## 2019-03-27 LAB — BASIC METABOLIC PANEL
Anion gap: 7 (ref 5–15)
BUN: 26 mg/dL — ABNORMAL HIGH (ref 8–23)
CO2: 27 mmol/L (ref 22–32)
Calcium: 8.3 mg/dL — ABNORMAL LOW (ref 8.9–10.3)
Chloride: 104 mmol/L (ref 98–111)
Creatinine, Ser: 1.01 mg/dL (ref 0.61–1.24)
GFR calc Af Amer: >60 mL/min (ref >60)
GFR calc non Af Amer: >60 mL/min (ref >60)
Glucose, Bld: 91 mg/dL (ref 70–99)
Potassium: 4.1 mmol/L (ref 3.5–5.1)
Sodium: 138 mmol/L (ref 135–145)

## 2019-03-27 LAB — CBC
HCT: 36 % — ABNORMAL LOW (ref 39.0–52.0)
Hemoglobin: 11.9 g/dL — ABNORMAL LOW (ref 13.0–17.0)
MCH: 30.1 pg (ref 26.0–34.0)
MCHC: 33.1 g/dL (ref 30.0–36.0)
MCV: 90.9 fL (ref 80.0–100.0)
Platelets: 348 10*3/uL (ref 150–400)
RBC: 3.96 MIL/uL — ABNORMAL LOW (ref 4.22–5.81)
RDW: 12.7 % (ref 11.5–15.5)
WBC: 7.5 10*3/uL (ref 4.0–10.5)
nRBC: 0 % (ref 0.0–0.2)

## 2019-03-27 MED ORDER — VITAMIN C 500 MG PO TABS
500.0000 mg | ORAL_TABLET | Freq: Two times a day (BID) | ORAL | Status: DC
  Administered 2019-03-27 – 2019-03-28 (×2): 500 mg via ORAL
  Filled 2019-03-27 (×2): qty 1

## 2019-03-27 MED ORDER — OXYCODONE HCL 5 MG PO TABS
2.5000 mg | ORAL_TABLET | Freq: Four times a day (QID) | ORAL | Status: DC | PRN
  Administered 2019-03-27 (×2): 2.5 mg via ORAL
  Filled 2019-03-27 (×2): qty 1

## 2019-03-27 MED ORDER — ADULT MULTIVITAMIN W/MINERALS CH
1.0000 | ORAL_TABLET | Freq: Every day | ORAL | Status: DC
  Administered 2019-03-28: 09:00:00 1 via ORAL
  Filled 2019-03-27: qty 1

## 2019-03-27 MED ORDER — ENSURE MAX PROTEIN PO LIQD
11.0000 [oz_av] | Freq: Two times a day (BID) | ORAL | Status: DC
  Administered 2019-03-27 – 2019-03-28 (×2): 11 [oz_av] via ORAL
  Filled 2019-03-27: qty 330

## 2019-03-27 NOTE — NC FL2 (Signed)
[place] LEVEL OF CARE SCREENING TOOL     IDENTIFICATION  Patient Name: Steven Meza. Birthdate: 1931/01/20 Sex: male Admission Date (Current Location): 03/25/2019  [place] and [place] Number:  Engineering geologist and Address:  [facility], [address], [place], [place] [address]      Provider Number: [id number]  Attending Physician Name and Address:  Annita Brod, MD  Relative Name and Phone Number:       Current Level of Care: Hospital Recommended Level of Care: [place] Prior Approval Number:    Date Approved/Denied:   PASRR Number: [id number]  Discharge Plan: SNF    Current Diagnoses: Patient Active Problem List   Diagnosis Date Noted  . Hypotension 03/26/2019  . AKI (acute kidney injury) ([place]) 03/08/2019  . Pain due to onychomycosis of toenails of both feet 10/23/2018  . Lymphedema 04/19/2018  . COPD (chronic obstructive pulmonary disease) ([place]) 04/19/2018  . Diverticulosis 04/19/2018  . History of colonic polyps 04/19/2018  . Hypertension 04/19/2018  . Morbid obesity ([place]) 04/19/2018  . Osteoarthritis 04/19/2018  . Sleep apnea 04/19/2018  . Venous stasis 04/19/2018  . History of bladder cancer 02/20/2013  . Increased frequency of urination 02/20/2013  . Malignant neoplasm of other specified sites of bladder 02/20/2013  . Microscopic hematuria 02/20/2013    Orientation RESPIRATION BLADDER Height & Weight     Self, Time, Situation, Place  Normal Continent Weight: 234 lb 14.4 oz (106.5 kg) Height:  5\' 5"  (165.1 cm)  BEHAVIORAL SYMPTOMS/MOOD NEUROLOGICAL BOWEL NUTRITION STATUS  (None) (None) Continent Diet(Heart healthy)  AMBULATORY STATUS COMMUNICATION OF NEEDS Skin   Extensive Assist Verbally Bruising, Other (Comment)(Cellulitis, Excoriated. Venous stasis ulcer on left leg: ABD, gauze BID.)                       Personal Care Assistance Level of Assistance  Bathing,  Dressing, Feeding Bathing Assistance: Limited assistance Feeding assistance: Limited assistance Dressing Assistance: Limited assistance     Functional Limitations Info  Sight, Hearing, Speech Sight Info: Adequate Hearing Info: Adequate Speech Info: Adequate    SPECIAL CARE FACTORS FREQUENCY  PT (By licensed PT), OT (By licensed OT)     PT Frequency: 5 x week OT Frequency: 5 x week            Contractures Contractures Info: Not present    Additional Factors Info  Code Status, Allergies Code Status Info: Full code Allergies Info: NKDA           Current Medications (03/27/2019):  This is the current hospital active medication list Current Facility-Administered Medications  Medication Dose Route Frequency Provider Last Rate Last Dose  . 0.9 %  sodium chloride infusion   Intravenous Continuous Mansy, Jan A, MD 100 mL/hr at 03/27/19 0836    . acetaminophen (TYLENOL) tablet 650 mg  650 mg Oral Q6H PRN Mansy, Jan A, MD       Or  . acetaminophen (TYLENOL) suppository 650 mg  650 mg Rectal Q6H PRN Mansy, Jan A, MD      . aspirin EC tablet 81 mg  81 mg Oral Daily Mansy, Jan A, MD   81 mg at 03/27/19 [id number]  . enoxaparin (LOVENOX) injection 40 mg  40 mg Subcutaneous Q24H Mansy, Jan A, MD   40 mg at 03/26/19 2032  . hydrochlorothiazide (HYDRODIURIL) tablet 25 mg  25 mg Oral Daily Mansy, Jan A, MD   25 mg at  03/27/19 [id number]  . labetalol (NORMODYNE) injection 20 mg  20 mg Intravenous Q3H PRN Mansy, Jan A, MD      . magnesium hydroxide (MILK OF MAGNESIA) suspension 30 mL  30 mL Oral Daily PRN Mansy, Jan A, MD      . ondansetron [facility]) tablet 4 mg  4 mg Oral Q6H PRN Mansy, Jan A, MD       Or  . ondansetron [facility]) injection 4 mg  4 mg Intravenous Q6H PRN Mansy, Jan A, MD      . oxyCODONE (Oxy IR/ROXICODONE) immediate release tablet 2.5 mg  2.5 mg Oral Q6H PRN Annita Brod, MD   2.5 mg at 03/27/19 [id number]  . pantoprazole (PROTONIX) EC tablet 40 mg  40 mg Oral Daily Mansy, Jan A, MD    40 mg at 03/27/19 [id number]  . polyethylene glycol (MIRALAX / GLYCOLAX) packet 17 g  17 g Oral Daily Annita Brod, MD      . traZODone (DESYREL) tablet 25 mg  25 mg Oral QHS PRN Mansy, Arvella Merles, MD         Discharge Medications: Please see discharge summary for a list of discharge medications.  Relevant Imaging Results:  Relevant Lab Results:   Additional Information SS#: [id number]  Candie Chroman, LCSW

## 2019-03-27 NOTE — Evaluation (Signed)
Physical Therapy Evaluation Patient Details Name: Steven Meza. MRN: [id number] DOB: 05-Mar-1931 Today's Date: 03/27/2019   History of Present Illness  Steven Meza  is a 85 y.o. Caucasian male with a known history of lower extremity cellulitis for which she was recently admitted here and discharged to skilled nursing facility for rehab, as well as hypertension, lymphedema and obstructive sleep apnea as well as CVA, who presented to the emergency room with acute onset of anorexia and hypotension.  He had a reported systolic blood pressure in the 80s at his SNF.  He was having associated lightheadedness.  He admitted to having low appetite there.  He stated that he keeps eating the same thing over and over, namely chicken pork chops and hamburgers.  He denied any chest pain or palpitations.  No nausea or vomiting or diarrhea or abdominal pain.  No cough or wheezing or fever or chills.  No dysuria, oliguria or hematuria or flank pain.  Clinical Impression  Pt admitted with above diagnosis. Pt currently with functional limitations due to the deficits listed below (see "PT Problem List"). Upon entry, pt in bed, awake and agreeable to participate. The pt is alert and oriented x4, pleasant, conversational, and generally a vague historian. Pt moving to/from supine with heavy effort but no needed physical assistance. Pt remarkably weak from EOB, requires maxA and several attempts to rise to standing. Pt able to maintain balance in standing with RW for 1-2 minutes, but weakness limits AMB capabilities. Functional mobility assessment demonstrates increased effort/time requirements, poor tolerance, and need for physical assistance, whereas the patient performed these at a higher level of independence PTA. Pt reports making good progress at rehab thus far, slow but steady. Pt will benefit from skilled PT intervention to increase independence and safety with basic mobility in preparation for discharge to the venue  listed below.      Follow Up Recommendations SNF;Supervision for mobility/OOB;Supervision - Intermittent    Equipment Recommendations  None recommended by PT    Recommendations for Other Services       Precautions / Restrictions Precautions Precautions: Fall Restrictions Weight Bearing Restrictions: No      Mobility  Bed Mobility Overal bed mobility: Modified Independent Bed Mobility: Supine to Sit;Sit to Supine     Supine to sit: Modified independent (Device/Increase time) Sit to supine: Modified independent (Device/Increase time)   General bed mobility comments: additional time and effort for AMB  Transfers Overall transfer level: Needs assistance Equipment used: Rolling walker (2 wheeled);1 person hand held assist Transfers: Sit to/from Stand Sit to Stand: Mod assist;Max assist         General transfer comment: from EOB with RW MaxA provided but he is unable to rise to standing; from EOB pt given to hands to pull self up with ModA then min-modA support for transitioning hands from hands  Ambulation/Gait Ambulation/Gait assistance: (once able to stand, can balance with RW for 1-2 minutes; attempts some side stepping to right at bedside, weak and labored minGuard assist for stability)              Stairs            Wheelchair Mobility    Modified Rankin (Stroke Patients Only)       Balance Overall balance assessment: Needs assistance Sitting-balance support: Feet supported Sitting balance-Leahy Scale: Good     Standing balance support: During functional activity;Bilateral upper extremity supported Standing balance-Leahy Scale: Poor  Pertinent Vitals/Pain Pain Assessment: 0-10 Pain Score: 7  Pain Location: Left lower leg (naer wounds) Pain Descriptors / Indicators: Aching;Sharp Pain Intervention(s): Limited activity within patient's tolerance;Monitored during session;Premedicated before  session;Repositioned;Patient requesting pain meds-RN notified    Home Living Family/patient expects to be discharged to:: Skilled nursing facility Living Arrangements: Spouse/significant other;Other (Comment) Available Help at Discharge: Family;Available 24 hours/day Type of Home: House Home Access: Level entry     Home Layout: One level Home Equipment: Grab bars - toilet;Shower seat - built in;Walker - 4 wheels;Walker - 2 wheels;Cane - quad;Hand held shower head;Hospital bed Additional Comments: Owns a lift chair    Prior Function Level of Independence: Independent with assistive device(s)         Comments: Mod Ind amb with a QC limited community distances, 6 falls in the last 6 months, Ind with ADLs     Hand Dominance        Extremity/Trunk Assessment   Upper Extremity Assessment Upper Extremity Assessment: Generalized weakness;Overall [facility] for tasks assessed    Lower Extremity Assessment Lower Extremity Assessment: Generalized weakness;Overall WFL for tasks assessed       Communication   Communication: HOH  Cognition Arousal/Alertness: Awake/alert Behavior During Therapy: WFL for tasks assessed/performed Overall Cognitive Status: Within Functional Limits for tasks assessed                                        General Comments      Exercises     Assessment/Plan    PT Assessment Patient needs continued PT services  PT Problem List Decreased strength;Decreased activity tolerance;Decreased balance;Decreased mobility;Decreased knowledge of use of DME       PT Treatment Interventions DME instruction;Gait training;Functional mobility training;Therapeutic activities;Therapeutic exercise;Balance training;Patient/family education    PT Goals (Current goals can be found in the Care Plan section)  Acute Rehab PT Goals Patient Stated Goal: To get stronger before going home PT Goal Formulation: With patient Time For Goal Achievement:  04/10/19 Potential to Achieve Goals: Fair    Frequency Min 2X/week   Barriers to discharge Inaccessible home environment;Decreased caregiver support      Co-evaluation               AM-PAC PT "6 Clicks" Mobility  Outcome Measure Help needed turning from your back to your side while in a flat bed without using bedrails?: A Little Help needed moving from lying on your back to sitting on the side of a flat bed without using bedrails?: A Little Help needed moving to and from a bed to a chair (including a wheelchair)?: A Lot Help needed standing up from a chair using your arms (e.g., wheelchair or bedside chair)?: A Lot Help needed to walk in hospital room?: Total Help needed climbing 3-5 steps with a railing? : Total 6 Click Score: 12    End of Session Equipment Utilized During Treatment: Gait belt Activity Tolerance: Patient tolerated treatment well;Patient limited by fatigue;Patient limited by pain Patient left: in bed;with call bell/phone within reach;with bed alarm set;with family/visitor present Nurse Communication: Mobility status PT Visit Diagnosis: Unsteadiness on feet (R26.81);History of falling (Z91.81);Difficulty in walking, not elsewhere classified (R26.2);Muscle weakness (generalized) (M62.81)    Time: [id number] PT Time Calculation (min) (ACUTE ONLY): 18 min   Charges:   PT Evaluation $PT Eval Moderate Complexity: 1 Mod PT Treatments $Therapeutic Exercise: 8-22 mins  1:04 PM, 03/27/19 Etta Grandchild, PT, DPT Physical Therapist - [facility]  [phone] ([place])   [place] C 03/27/2019, 12:53 PM

## 2019-03-27 NOTE — Progress Notes (Signed)
Initial Nutrition Assessment  DOCUMENTATION CODES:   Obesity unspecified  INTERVENTION:   Ensure Max protein supplement BID, each supplement provides 150kcal and 30g of protein.  MVI daily   Vitamin C 534m po BID   NUTRITION DIAGNOSIS:   Increased nutrient needs(COPD, wound healing) related to catabolic illness(COPD, wound healing) as evidenced by increased estimated needs.  GOAL:   Patient will meet greater than or equal to 90% of their needs  MONITOR:   PO intake, Supplement acceptance, Labs, Weight trends, Skin, I & O's  REASON FOR ASSESSMENT:   Consult Assessment of nutrition requirement/status  ASSESSMENT:   85year old male with h/o COPD admitted with lower extremity cellulitis and AKI   Met with pt in room today. Pt reports good appetite and oral intake at baseline. [facility] foods [facility] and over and because of this he feels his oral intake has declined. Pt does not drink any supplements or take any vitamins on a daily basis. Pt reports eating 100% of meals in hospital today. RD will add supplements and vitamins to support wound healing. Pt educated on the importance of adequate nutrition needed for wound healing. Per chart, pt down 27lbs(10%) over the past 10 months; this is not significant.   Medications reviewed and include: aspirin, lovenox, MVI, protonix, miralax, NaCl @100ml /hr  Labs reviewed:   NUTRITION - FOCUSED PHYSICAL EXAM:    Most Recent Value  Orbital Region  No depletion  Upper Arm Region  No depletion  Thoracic and Lumbar Region  No depletion  Buccal Region  No depletion  Temple Region  No depletion  Clavicle Bone Region  No depletion  Clavicle and Acromion Bone Region  No depletion  Scapular Bone Region  No depletion  Dorsal Hand  No depletion  Patellar Region  No depletion  Anterior Thigh Region  No depletion  Posterior Calf Region  No depletion  Edema (RD Assessment)  None   Hair  Reviewed  Eyes  Reviewed  Mouth  Reviewed  Skin  Reviewed  Nails  Reviewed     Diet Order:   Diet Order            Diet Heart Room service appropriate? Yes; Fluid consistency: Thin  Diet effective now             EDUCATION NEEDS:   Education needs have been addressed  Skin:  Skin Assessment: Reviewed RN Assessment(LLE Cellulitis, ecchymosis)  Last BM:  11/24- type 3  Height:   Ht Readings from Last 1 Encounters:  03/26/19 5' 5"  (1.651 m)    Weight:   Wt Readings from Last 1 Encounters:  03/27/19 106.5 kg    Ideal Body Weight:  61.8 kg  BMI:  Body mass index is 39.09 kg/m.  Estimated Nutritional Needs:   Kcal:  2000-2300kcal/day  Protein:  100-115g/day  Fluid:  >1.6L/day  CKoleen DistanceMS, RD, LDN Pager #- [phone]#- [phone] Hours Pager: [phone]

## 2019-03-27 NOTE — TOC Progression Note (Addendum)
Transition of Care (TOC) - Progression Note    Patient Details  Name: Steven Meza. MRN: [id number] Date of Birth: February 17, 1931  Transition of Care [facility]) CM/SW Contact  Candie Chroman, LCSW Phone Number: 03/27/2019, 1:55 PM  Clinical Narrative: Per MD, patient is stable for discharge back to [facility]. CSW faxed clinicals to [facility] for insurance authorization review. Will fax OT eval once entered. Will likely obtain authorization tomorrow. SNF admissions coordinator is aware. Patient will not need a new COVID test.  3:23 pm: Faxed OT eval to [facility].  Expected Discharge Plan: [place]    Expected Discharge Plan and Services Expected Discharge Plan: [place] arrangements for the past 2 months: [place], Single Family Home                                       Social Determinants of Health (SDOH) Interventions    Readmission Risk Interventions No flowsheet data found.

## 2019-03-27 NOTE — Evaluation (Signed)
Occupational Therapy Evaluation Patient Details Name: Steven Meza. MRN: [id number] DOB: [birth year] Today's Date: 03/27/2019    History of Present Illness Steven Meza  is a 85 y.o. Caucasian male with a known history of lower extremity cellulitis for which she was recently admitted here and discharged to skilled nursing facility for rehab, as well as hypertension, lymphedema and obstructive sleep apnea as well as CVA, who presented to the emergency room with acute onset of anorexia and hypotension.  He had a reported systolic blood pressure in the 80s at his SNF.  He was having associated lightheadedness.  He admitted to having low appetite there.  He stated that he keeps eating the same thing over and over, namely chicken pork chops and hamburgers.  He denied any chest pain or palpitations.  No nausea or vomiting or diarrhea or abdominal pain.  No cough or wheezing or fever or chills.  No dysuria, oliguria or hematuria or flank pain.   Clinical Impression   Steven Meza was seen for OT evaluation this date. Prior to initial illness, pt reports he was generally independent with ADL management. He endorses that he was independent with bathing and dressing, states he was driving and getting around in the community with a quad cane for support. He also endorses multiple falls in the past 6 months. Pt lives with his spouse in a 1 story home with a level entry. Currently pt demonstrates impairments in activity tolerance and pain management with decreased LE strength and sensation which functionally impacts his abilities to perform self-care and ADL tasks. Currently, pt requires moderate assistance for LB bathing and dressing tasks. He also requires +2 moderate assist for safety and physical assistance during functional transfers.  Pt would benefit from skilled OT to address noted impairments and functional limitations (see below for any additional details) in order to maximize safety and independence  while minimizing falls risk and caregiver burden.  Upon hospital discharge, recommend STR to maximize pt safety and return to PLOF.     Follow Up Recommendations  SNF    Equipment Recommendations  Other (comment)(TBD)    Recommendations for Other Services       Precautions / Restrictions Precautions Precautions: Fall Precaution Comments: High Fall Restrictions Weight Bearing Restrictions: No      Mobility Bed Mobility Overal bed mobility: Modified Independent Bed Mobility: Supine to Sit;Sit to Supine     Supine to sit: Modified independent (Device/Increase time) Sit to supine: Modified independent (Device/Increase time)   General bed mobility comments: Pt declines mobility with this therapist. Per Physical Therapist note, pt mod I for bed mobility with signigicant increase in effort to perform.  Transfers Overall transfer level: Needs assistance Equipment used: Rolling walker (2 wheeled);1 person hand held assist Transfers: Sit to/from Stand Sit to Stand: Mod assist;Max assist            Balance Overall balance assessment: Needs assistance Sitting-balance support: Feet supported Sitting balance-Leahy Scale: Good     Standing balance support: During functional activity;Bilateral upper extremity supported Standing balance-Leahy Scale: Poor                             ADL either performed or assessed with clinical judgement   ADL Overall ADL's : Needs assistance/impaired  General ADL Comments: Pt currently limited by BLE weakness and decreased activity tolerance. Pt reports he had begun to be able to complete toilet transfers independently at his skilled nursing facility transferring from his [facility] to his commode. Pt currently requires +2 assist for functional mobility, and at least moderate assist for LB ADL management.     Vision Baseline Vision/History: Wears glasses Wears Glasses: At all  times Patient Visual Report: No change from baseline       Perception     Praxis      Pertinent Vitals/Pain Pain Assessment: 0-10 Pain Score: 5  Pain Location: BLEs Pain Descriptors / Indicators: Aching;Sore;Guarding Pain Intervention(s): Limited activity within patient's tolerance;Monitored during session;Premedicated before session     Hand Dominance Right   Extremity/Trunk Assessment Upper Extremity Assessment Upper Extremity Assessment: Generalized weakness(Pt BUE grip and FMC slowed with noted weakness bilat. WFL for light ADL management such as self-feeding/grooming tasks.)   Lower Extremity Assessment Lower Extremity Assessment: Defer to PT evaluation;Generalized weakness       Communication Communication Communication: HOH   Cognition Arousal/Alertness: Awake/alert Behavior During Therapy: WFL for tasks assessed/performed Overall Cognitive Status: Within Functional Limits for tasks assessed                                 General Comments: Pt states at start of session, "I don't need any more excitement today. I already stood up with PT 2 hours ago". Agreeable to discussion and bed-level activity with this therapist with some encouragement.   General Comments       Exercises Other Exercises Other Exercises: Pt educated on falls prevention strategies, role of OT in the acute care setting vs STR, as well as conversation regarding meaningful occupations and leisure exploration this date.   Shoulder Instructions      Home Living Family/patient expects to be discharged to:: Skilled nursing facility Living Arrangements: Spouse/significant other Available Help at Discharge: Family;Available 24 hours/day Type of Home: House Home Access: Level entry     Home Layout: One level     Bathroom Shower/Tub: Occupational psychologist: Standard     Home Equipment: Grab bars - toilet;Shower seat - built in;Walker - 4 wheels;Walker - 2 wheels;Cane  - quad;Hand held shower head;Hospital bed   Additional Comments: Owns a lift chair      Prior Functioning/Environment Level of Independence: Independent with assistive device(s)        Comments: Mod Ind amb with a QC limited community distances, 6 falls in the last 6 months, Ind with ADLs        OT Problem List: Decreased strength;Decreased coordination;Decreased range of motion;Decreased activity tolerance;Decreased safety awareness;Impaired balance (sitting and/or standing);Decreased knowledge of use of DME or AE      OT Treatment/Interventions: Self-care/ADL training;Balance training;Therapeutic exercise;Therapeutic activities;Energy conservation;DME and/or AE instruction;Patient/family education    OT Goals(Current goals can be found in the care plan section) Acute Rehab OT Goals Patient Stated Goal: To get stronger before going home OT Goal Formulation: With patient Time For Goal Achievement: 04/10/19 Potential to Achieve Goals: Good ADL Goals Pt Will Perform Grooming: sitting;with modified independence Pt Will Transfer to Toilet: ambulating;with supervision;with set-up(With LRAD PRN for improved safety and functional independence.) Pt Will Perform Toileting - Clothing Manipulation and hygiene: sit to/from stand;with set-up;with supervision(With LRAD PRN for improved safety and functional independence.)  OT Frequency: Min 1X/week   Barriers to D/C:  Co-evaluation              AM-PAC OT "6 Clicks" Daily Activity     Outcome Measure Help from another person eating meals?: None Help from another person taking care of personal grooming?: A Little Help from another person toileting, which includes using toliet, bedpan, or urinal?: Total Help from another person bathing (including washing, rinsing, drying)?: A Lot Help from another person to put on and taking off regular upper body clothing?: A Little Help from another person to put on and taking off regular  lower body clothing?: A Lot 6 Click Score: 15   End of Session    Activity Tolerance: Patient limited by fatigue Patient left: in bed;with call bell/phone within reach;with bed alarm set  OT Visit Diagnosis: Other abnormalities of gait and mobility (R26.89);Repeated falls (R29.6)                Time: [id number] OT Time Calculation (min): 27 min Charges:  OT General Charges $OT Visit: 1 Visit OT Evaluation $OT Eval Moderate Complexity: 1 Mod OT Treatments $Self Care/Home Management : 8-22 mins  Shara Blazing, M.S., OTR/L Ascom: [phone] 03/27/19, 3:14 PM

## 2019-03-27 NOTE — Progress Notes (Signed)
PROGRESS NOTE  Steven Meza. [id number] DOB: 04-Oct-1930 DOA: 03/25/2019 PCP: Idelle Crouch, MD  HPI/Recap of past 24 hours: Patient is an 85 year old male known to me from his previous admission 2 weeks ago after being admitted for a lower extremity cellulitis and being discharged to a skilled nursing facility.  He was sent over from skilled nursing after having several days of anorexia and noted to be hypotensive and then sent to the emergency room on evening of 11/22.  Patient found to have creatinine of 2.28 (was normal 2 weeks prior) and was hypotensive with blood pressure initially in the 100s.  Patient was given IV fluids and admitted to hospital service.  Following admission, on the morning of 11/23, patient's blood pressure trending downwards as low as mid 0000000 systolic by 8 AM.  Responded well to IV fluids.  Blood pressure improved throughout the day.  Today, patient is feeling much better.  Blood pressures have been much more stable.  Had a dose of MiraLAX this morning which led to very large bowel movement.  Patient tells me has not had a bowel movement in over a week.  Renal function this morning almost fully normalized.  Assessment/Plan: Active Problems:   Lymphedema/venous stasis: No evidence of acute cellulitis.  Seen by wound care.  Appreciate their recommendations.  Have added vitamin C and multivitamin to his regimen.    COPD (chronic obstructive pulmonary disease) ([place]): Stable.    Hypertension/hypotension: Suspect because of this is poor p.o. intake and also from his pain medications.  Will decrease frequency and strength of his pain medications.  Continue fluid resuscitation.    Morbid obesity [facility]): Patient meets criteria with BMI greater than 40.    AKI (acute kidney injury) ([place]): Secondary to hypotension.  Almost back to baseline with IV fluids.  Anorexia: Suspect part of his hypotension is due to poor p.o. intake, likely anorexia from opioid-induced  constipation.  Responded to dose of MiraLAX.  Would recommend upon discharge MiraLAX every other day.   Code Status: Full code  Family Communication: Left message for wife  Disposition Plan: Potential return back to skilled nursing tomorrow if stays awake, tolerating p.o., has bowel movement and renal function back to normal   Consultants:  None  Procedures:  None  Antimicrobials:  None  DVT prophylaxis: Lovenox   Objective: Vitals:   03/27/19 0714 03/27/19 1511  BP: (!) 136/58 (!) 124/56  Pulse: 76 76  Resp: 17 17  Temp: 98.3 F (36.8 C) 98.4 F (36.9 C)  SpO2: 97% 97%    Intake/Output Summary (Last 24 hours) at 03/27/2019 1526 Last data filed at 03/27/2019 1508 Gross per 24 hour  Intake 1971.77 ml  Output 1075 ml  Net 896.77 ml   Filed Weights   03/25/19 1659 03/26/19 1900 03/27/19 0414  Weight: 105.7 kg 108.2 kg 106.5 kg   Body mass index is 39.09 kg/m.  Exam:   General: Alert and oriented x3, no acute distress  HEENT: Atraumatic, mucous membranes moist  Neck: Thick, narrow airway  Cardiovascular: Regular rate and rhythm, S1-S2  Respiratory: Clear to auscultation bilaterally  Abdomen: Soft, nontender, obese, hypoactive bowel sounds  Musculoskeletal: Trace to 1+ pitting edema bilaterally  Skin: Chronic lower extremity venous stasis with signs of skin discoloration from lymphedema, left greater than right  Psychiatry: appropriate, no evidence of psychoses.  Neuro: No focal deficits   Data Reviewed: CBC: Recent Labs  Lab 03/25/19 1704 03/26/19 1105 03/27/19 0542  WBC 10.1  9.6 7.5  NEUTROABS 7.1  --   --   HGB 12.4* 12.5* 11.9*  HCT 39.3 37.9* 36.0*  MCV 96.3 92.0 90.9  PLT 440* 379 0000000   Basic Metabolic Panel: Recent Labs  Lab 03/25/19 1704 03/26/19 1105 03/27/19 0542  NA 137 135 138  K 4.8 4.2 4.1  CL 98 100 104  CO2 28 26 27   GLUCOSE 119* 98 91  BUN 45* 40* 26*  CREATININE 2.28* 1.42* 1.01  CALCIUM 8.5* 8.2* 8.3*    GFR: Estimated Creatinine Clearance: 59 mL/min (by C-G formula based on SCr of 1.01 mg/dL). Liver Function Tests: Recent Labs  Lab 03/25/19 1704  AST 23  ALT 16  ALKPHOS 112  BILITOT 0.6  PROT 6.9  ALBUMIN 2.8*   No results for input(s): LIPASE, AMYLASE in the last 168 hours. No results for input(s): AMMONIA in the last 168 hours. Coagulation Profile: No results for input(s): INR, PROTIME in the last 168 hours. Cardiac Enzymes: No results for input(s): CKTOTAL, CKMB, CKMBINDEX, TROPONINI in the last 168 hours. BNP (last 3 results) No results for input(s): PROBNP in the last 8760 hours. HbA1C: No results for input(s): HGBA1C in the last 72 hours. CBG: Recent Labs  Lab 03/26/19 1107  GLUCAP 92   Lipid Profile: No results for input(s): CHOL, HDL, LDLCALC, TRIG, CHOLHDL, LDLDIRECT in the last 72 hours. Thyroid Function Tests: No results for input(s): TSH, T4TOTAL, FREET4, T3FREE, THYROIDAB in the last 72 hours. Anemia Panel: No results for input(s): VITAMINB12, FOLATE, FERRITIN, TIBC, IRON, RETICCTPCT in the last 72 hours. Urine analysis:    Component Value Date/Time   COLORURINE AMBER (A) 03/08/2019 1519   APPEARANCEUR HAZY (A) 03/08/2019 1519   LABSPEC 1.024 03/08/2019 1519   PHURINE 5.0 03/08/2019 1519   GLUCOSEU NEGATIVE 03/08/2019 1519   HGBUR NEGATIVE 03/08/2019 1519   BILIRUBINUR NEGATIVE 03/08/2019 1519   KETONESUR NEGATIVE 03/08/2019 1519   PROTEINUR NEGATIVE 03/08/2019 1519   NITRITE NEGATIVE 03/08/2019 1519   LEUKOCYTESUR NEGATIVE 03/08/2019 1519   Sepsis Labs: @LABRCNTIP (procalcitonin:4,lacticidven:4)  ) Recent Results (from the past 240 hour(s))  Blood culture (routine x 2)     Status: None (Preliminary result)   Collection Time: 03/25/19  7:29 PM   Specimen: BLOOD  Result Value Ref Range Status   Specimen Description BLOOD LEFT HAND  Final   Special Requests   Final    BOTTLES DRAWN AEROBIC AND ANAEROBIC Blood Culture adequate volume    Culture   Final    NO GROWTH < 12 HOURS Performed at [facility], [address] [place], [place] [address]    Report Status PENDING  Incomplete  Blood culture (routine x 2)     Status: None (Preliminary result)   Collection Time: 03/25/19  7:29 PM   Specimen: BLOOD  Result Value Ref Range Status   Specimen Description BLOOD RIGHT FOREARM  Final   Special Requests   Final    BOTTLES DRAWN AEROBIC AND ANAEROBIC Blood Culture adequate volume   Culture   Final    NO GROWTH < 12 HOURS Performed at [facility], [address] [place], [place] [address]    Report Status PENDING  Incomplete  SARS CORONAVIRUS 2 (TAT 6-24 HRS) Nasopharyngeal Nasopharyngeal Swab     Status: None   Collection Time: 03/26/19  3:27 AM   Specimen: Nasopharyngeal Swab  Result Value Ref Range Status   SARS Coronavirus 2 NEGATIVE NEGATIVE Final    Comment: (NOTE) SARS-CoV-2 target nucleic  acids are NOT DETECTED. The SARS-CoV-2 RNA is generally detectable in upper and lower respiratory specimens during the acute phase of infection. Negative results do not preclude SARS-CoV-2 infection, do not rule out co-infections with other pathogens, and should not be used as the sole basis for treatment or other patient management decisions. Negative results must be combined with clinical observations, patient history, and epidemiological information. The expected result is Negative. Fact Sheet for Patients: SugarRoll.be Fact Sheet for Healthcare Providers: https://www.woods-mathews.com/ This test is not yet approved or cleared by the [place] FDA and  has been authorized for detection and/or diagnosis of SARS-CoV-2 by FDA under an Emergency Use Authorization (EUA). This EUA will remain  in effect (meaning this test can be used) for the duration of the COVID-19 declaration under Section 56 4(b)(1) of the Act, 21 U.S.C. section 360bbb-3(b)(1), unless the  authorization is terminated or revoked sooner. Performed at [facility], [place] [address], [place], [place] [address]       Studies: No results found.  Scheduled Meds: . aspirin EC  81 mg Oral Daily  . enoxaparin (LOVENOX) injection  40 mg Subcutaneous Q24H  . hydrochlorothiazide  25 mg Oral Daily  . [START ON 03/28/2019] multivitamin with minerals  1 tablet Oral Daily  . pantoprazole  40 mg Oral Daily  . polyethylene glycol  17 g Oral Daily  . Ensure Max Protein  11 oz Oral BID  . vitamin C  500 mg Oral BID    Continuous Infusions: . sodium chloride 100 mL/hr at 03/27/19 0836     LOS: 2 days     Annita Brod, MD [facility]  To reach me or the doctor on call, go to: [email or web address] Password [email or web address]  03/27/2019, 3:26 PM

## 2019-03-28 DIAGNOSIS — I10 Essential (primary) hypertension: Secondary | ICD-10-CM

## 2019-03-28 DIAGNOSIS — J449 Chronic obstructive pulmonary disease, unspecified: Secondary | ICD-10-CM

## 2019-03-28 LAB — BASIC METABOLIC PANEL
Anion gap: 10 (ref 5–15)
BUN: 20 mg/dL (ref 8–23)
CO2: 25 mmol/L (ref 22–32)
Calcium: 8.1 mg/dL — ABNORMAL LOW (ref 8.9–10.3)
Chloride: 100 mmol/L (ref 98–111)
Creatinine, Ser: 0.97 mg/dL (ref 0.61–1.24)
GFR calc Af Amer: >60 mL/min (ref >60)
GFR calc non Af Amer: >60 mL/min (ref >60)
Glucose, Bld: 92 mg/dL (ref 70–99)
Potassium: 4.2 mmol/L (ref 3.5–5.1)
Sodium: 135 mmol/L (ref 135–145)

## 2019-03-28 MED ORDER — ASCORBIC ACID 500 MG PO TABS: 500.0000 mg | ORAL_TABLET | Freq: Two times a day (BID) | ORAL | 0 refills | Status: AC

## 2019-03-28 MED ORDER — ENSURE MAX PROTEIN PO LIQD: 11.0000 [oz_av] | Freq: Two times a day (BID) | ORAL | 0 refills | Status: AC

## 2019-03-28 MED ORDER — ADULT MULTIVITAMIN W/MINERALS CH: 1.0000 | ORAL_TABLET | Freq: Every day | ORAL | 0 refills | Status: AC

## 2019-03-28 MED ORDER — POLYETHYLENE GLYCOL 3350 17 G PO PACK: 17.0000 g | PACK | Freq: Every day | ORAL | 0 refills | Status: AC | PRN

## 2019-03-28 MED ORDER — OXYCODONE HCL 5 MG PO TABS: 2.5000 mg | ORAL_TABLET | Freq: Four times a day (QID) | ORAL | 0 refills | Status: AC | PRN

## 2019-03-28 NOTE — Discharge Summary (Signed)
Steven Meza. [id number] DOB: 1930-05-21 DOA: 03/25/2019  PCP: Idelle Crouch, MD  Admit date: 03/25/2019 Discharge date: 03/28/2019  Admitted From: [facility] Disposition:  [place]  Recommendations for Outpatient Follow-up:  1. Follow up with PCP in 1 week 2. Please obtain BMP/CBC in one week 3. Please follow up on the following pending results:final blood culture- so far negative  Home Health:NO   Discharge Condition:Stable CODE STATUS: full code Diet recommendation: Heart Healthy-thin fluid. Ensure Max protein supplement BID, each supplement provides 150kcal and 30g of protein.    Brief/Interim Summary: Steven Meza  is a 85 y.o. Caucasian male with a known history of lower extremity cellulitis for which she was recently admitted here and discharged to skilled nursing facility for rehab, as well as hypertension, lymphedema and obstructive sleep apnea as well as CVA, who presented to the emergency room with acute onset of anorexia and hypotension.  He had a reported systolic blood pressure in the 80s at his SNF.  He was having associated lightheadedness.  He admitted to having low appetite there. Upon presentation to the emergency room, the patient's blood pressure 106/59 with otherwise normal vital signs.  Labs revealed potassium of 4.8 and BUN of 45 and creatinine 2.28 compared to 20 and 0.9 on 03/11/2019.  CBC was unremarkable.  Procalcitonin was less than 0.1.  EKG showed normal sinus rhythm with rate of 76 with prolonged PR interval and left bundle branch block.Was treated with IVF, creatinine improved as it was prerenal, and at baseline. Also had a bowel movement with Miralax. Blood pressure has remained stable.  COVID-19 test negative.  Blood culture from 03/25/2019 have been negative.  Discharge Diagnoses:  Principal Problem:   Hypotension Active Problems:   Lymphedema   COPD (chronic obstructive pulmonary disease) (HCC)   Hypertension   Morbid  obesity (HCC)   Venous stasis   AKI (acute kidney injury) ([place])   Therapeutic opioid induced constipation    Discharge Instructions  Discharge Instructions    Call MD for:  difficulty breathing, headache or visual disturbances   Complete by: As directed    Call MD for:  temperature >100.4   Complete by: As directed    Diet - low sodium heart healthy   Complete by: As directed    Discharge instructions   Complete by: As directed    Follow up with pcp in 3-7 days   Increase activity slowly   Complete by: As directed      Allergies as of 03/28/2019   No Known Allergies     Medication List    STOP taking these medications   hydrochlorothiazide 25 MG tablet Commonly known as: HYDRODIURIL   lisinopril 20 MG tablet Commonly known as: ZESTRIL     TAKE these medications   ascorbic acid 500 MG tablet Commonly known as: VITAMIN C Take 1 tablet (500 mg total) by mouth 2 (two) times daily.   aspirin 81 MG tablet Take 81 mg by mouth daily.   Ensure Max Protein Liqd Take 330 mLs (11 oz total) by mouth 2 (two) times daily.   multivitamin with minerals Tabs tablet Take 1 tablet by mouth daily. Start taking on: March 29, 2019   omeprazole 40 MG capsule Commonly known as: PRILOSEC Take 40 mg by mouth daily.   oxyCODONE 5 MG immediate release tablet Commonly known as: Oxy IR/ROXICODONE Take 0.5 tablets (2.5 mg total) by mouth every 6 (six) hours as needed for moderate pain or severe pain.  What changed:   how much to take  when to take this  reasons to take this   polyethylene glycol 17 g packet Commonly known as: MIRALAX / GLYCOLAX Take 17 g by mouth daily as needed.      Contact information for after-discharge care    [facility] .   Service: Skilled Nursing Contact information: [address] [place] [phone]             No Known  Allergies  Consultations:  None   Procedures/Studies: No results found.    Subjective: Patient is doing well this AM.  Has no complaints.  Discharge Exam: Vitals:   03/28/19 0441 03/28/19 0806  BP: (!) 141/67 138/70  Pulse: 83 84  Resp: 20 18  Temp: 98.7 F (37.1 C) 98.2 F (36.8 C)  SpO2: 95% 97%   Vitals:   03/27/19 1511 03/27/19 2014 03/28/19 0441 03/28/19 0806  BP: (!) 124/56 120/64 (!) 141/67 138/70  Pulse: 76 76 83 84  Resp: 17 20 20 18   Temp: 98.4 F (36.9 C) 97.6 F (36.4 C) 98.7 F (37.1 C) 98.2 F (36.8 C)  TempSrc: Oral Oral Oral Oral  SpO2: 97% 98% 95% 97%  Weight:      Height:        General: Pt is alert, awake, not in acute distress Cardiovascular: RRR, S1/S2 +, no rubs, no gallops Respiratory: CTA bilaterally, no wheezing, no rhonchi Abdominal: Soft, NT, ND, bowel sounds + Extremities: mild  edema, no cyanosis    The results of significant diagnostics from this hospitalization (including imaging, microbiology, ancillary and laboratory) are listed below for reference.     Microbiology: Recent Results (from the past 240 hour(s))  Blood culture (routine x 2)     Status: None (Preliminary result)   Collection Time: 03/25/19  7:29 PM   Specimen: BLOOD  Result Value Ref Range Status   Specimen Description BLOOD LEFT HAND  Final   Special Requests   Final    BOTTLES DRAWN AEROBIC AND ANAEROBIC Blood Culture adequate volume   Culture   Final    NO GROWTH 3 DAYS Performed at [facility], [address], [place], [place] [address]    Report Status PENDING  Incomplete  Blood culture (routine x 2)     Status: None (Preliminary result)   Collection Time: 03/25/19  7:29 PM   Specimen: BLOOD  Result Value Ref Range Status   Specimen Description BLOOD RIGHT FOREARM  Final   Special Requests   Final    BOTTLES DRAWN AEROBIC AND ANAEROBIC Blood Culture adequate volume   Culture   Final    NO GROWTH 3 DAYS Performed at [facility], [address], [place], [place] [address]    Report Status PENDING  Incomplete  SARS CORONAVIRUS 2 (TAT 6-24 HRS) Nasopharyngeal Nasopharyngeal Swab     Status: None   Collection Time: 03/26/19  3:27 AM   Specimen: Nasopharyngeal Swab  Result Value Ref Range Status   SARS Coronavirus 2 NEGATIVE NEGATIVE Final    Comment: (NOTE) SARS-CoV-2 target nucleic acids are NOT DETECTED. The SARS-CoV-2 RNA is generally detectable in upper and lower respiratory specimens during the acute phase of infection. Negative results do not preclude SARS-CoV-2 infection, do not rule out co-infections with other pathogens, and should not be used as the sole basis for treatment or other patient management decisions. Negative results must be combined with  clinical observations, patient history, and epidemiological information. The expected result is Negative. Fact Sheet for Patients: SugarRoll.be Fact Sheet for Healthcare Providers: https://www.woods-mathews.com/ This test is not yet approved or cleared by the [place] FDA and  has been authorized for detection and/or diagnosis of SARS-CoV-2 by FDA under an Emergency Use Authorization (EUA). This EUA will remain  in effect (meaning this test can be used) for the duration of the COVID-19 declaration under Section 56 4(b)(1) of the Act, 21 U.S.C. section 360bbb-3(b)(1), unless the authorization is terminated or revoked sooner. Performed at [facility], [place] [address], [place], [place] [address]      Labs: BNP (last 3 results) No results for input(s): BNP in the last 8760 hours. Basic Metabolic Panel: Recent Labs  Lab 03/25/19 1704 03/26/19 1105 03/27/19 0542 03/28/19 0518  NA 137 135 138 135  K 4.8 4.2 4.1 4.2  CL 98 100 104 100  CO2 28 26 27 25   GLUCOSE 119* 98 91 92  BUN 45* 40* 26* 20  CREATININE 2.28* 1.42* 1.01 0.97  CALCIUM 8.5* 8.2* 8.3* 8.1*   Liver Function  Tests: Recent Labs  Lab 03/25/19 1704  AST 23  ALT 16  ALKPHOS 112  BILITOT 0.6  PROT 6.9  ALBUMIN 2.8*   No results for input(s): LIPASE, AMYLASE in the last 168 hours. No results for input(s): AMMONIA in the last 168 hours. CBC: Recent Labs  Lab 03/25/19 1704 03/26/19 1105 03/27/19 0542  WBC 10.1 9.6 7.5  NEUTROABS 7.1  --   --   HGB 12.4* 12.5* 11.9*  HCT 39.3 37.9* 36.0*  MCV 96.3 92.0 90.9  PLT 440* 379 348   Cardiac Enzymes: No results for input(s): CKTOTAL, CKMB, CKMBINDEX, TROPONINI in the last 168 hours. BNP: Invalid input(s): POCBNP CBG: Recent Labs  Lab 03/26/19 1107  GLUCAP 92   D-Dimer No results for input(s): DDIMER in the last 72 hours. Hgb A1c No results for input(s): HGBA1C in the last 72 hours. Lipid Profile No results for input(s): CHOL, HDL, LDLCALC, TRIG, CHOLHDL, LDLDIRECT in the last 72 hours. Thyroid function studies No results for input(s): TSH, T4TOTAL, T3FREE, THYROIDAB in the last 72 hours.  Invalid input(s): FREET3 Anemia work up No results for input(s): VITAMINB12, FOLATE, FERRITIN, TIBC, IRON, RETICCTPCT in the last 72 hours. Urinalysis    Component Value Date/Time   COLORURINE AMBER (A) 03/08/2019 1519   APPEARANCEUR HAZY (A) 03/08/2019 1519   LABSPEC 1.024 03/08/2019 1519   PHURINE 5.0 03/08/2019 1519   GLUCOSEU NEGATIVE 03/08/2019 1519   HGBUR NEGATIVE 03/08/2019 1519   BILIRUBINUR NEGATIVE 03/08/2019 1519   KETONESUR NEGATIVE 03/08/2019 1519   PROTEINUR NEGATIVE 03/08/2019 1519   NITRITE NEGATIVE 03/08/2019 1519   LEUKOCYTESUR NEGATIVE 03/08/2019 1519   Sepsis Labs Invalid input(s): PROCALCITONIN,  WBC,  LACTICIDVEN Microbiology Recent Results (from the past 240 hour(s))  Blood culture (routine x 2)     Status: None (Preliminary result)   Collection Time: 03/25/19  7:29 PM   Specimen: BLOOD  Result Value Ref Range Status   Specimen Description BLOOD LEFT HAND  Final   Special Requests   Final    BOTTLES DRAWN  AEROBIC AND ANAEROBIC Blood Culture adequate volume   Culture   Final    NO GROWTH 3 DAYS Performed at [facility], [place] [place], [place] [address]    Report Status PENDING  Incomplete  Blood culture (routine x 2)     Status: None (Preliminary  result)   Collection Time: 03/25/19  7:29 PM   Specimen: BLOOD  Result Value Ref Range Status   Specimen Description BLOOD RIGHT FOREARM  Final   Special Requests   Final    BOTTLES DRAWN AEROBIC AND ANAEROBIC Blood Culture adequate volume   Culture   Final    NO GROWTH 3 DAYS Performed at [facility], [address], [place], [place] [address]    Report Status PENDING  Incomplete  SARS CORONAVIRUS 2 (TAT 6-24 HRS) Nasopharyngeal Nasopharyngeal Swab     Status: None   Collection Time: 03/26/19  3:27 AM   Specimen: Nasopharyngeal Swab  Result Value Ref Range Status   SARS Coronavirus 2 NEGATIVE NEGATIVE Final    Comment: (NOTE) SARS-CoV-2 target nucleic acids are NOT DETECTED. The SARS-CoV-2 RNA is generally detectable in upper and lower respiratory specimens during the acute phase of infection. Negative results do not preclude SARS-CoV-2 infection, do not rule out co-infections with other pathogens, and should not be used as the sole basis for treatment or other patient management decisions. Negative results must be combined with clinical observations, patient history, and epidemiological information. The expected result is Negative. Fact Sheet for Patients: SugarRoll.be Fact Sheet for Healthcare Providers: https://www.woods-mathews.com/ This test is not yet approved or cleared by the [place] FDA and  has been authorized for detection and/or diagnosis of SARS-CoV-2 by FDA under an Emergency Use Authorization (EUA). This EUA will remain  in effect (meaning this test can be used) for the duration of the COVID-19 declaration under Section 56 4(b)(1) of the Act,  21 U.S.C. section 360bbb-3(b)(1), unless the authorization is terminated or revoked sooner. Performed at [facility], [place] [address], [place],  [address]      Time coordinating discharge: Over 30 minutes  SIGNED:   Nolberto Hanlon, MD  [facility] 03/28/2019, 12:25 PM Pager   If 7PM-7AM, please contact night-coverage [email or web address] Password [email or web address]

## 2019-03-28 NOTE — TOC Progression Note (Addendum)
Transition of Care (TOC) - Progression Note    Patient Details  Name: Steven Meza. MRN: [id number] Date of Birth: 12-15-1930  Transition of Care [facility]) CM/SW [place], LCSW Phone Number: 03/28/2019, 9:24 AM  Clinical Narrative: Insurance authorization is still pending.    11:53 am: Ship broker approved. Reference # [id number]. MD is aware and will start working on discharge.  Expected Discharge Plan: [place]    Expected Discharge Plan and Services Expected Discharge Plan: [place] arrangements for the past 2 months: [place], Single Family Home                                       Social Determinants of Health (SDOH) Interventions    Readmission Risk Interventions No flowsheet data found.

## 2019-03-28 NOTE — Care Management Important Message (Signed)
Important Message  Patient Details  Name: Steven Meza. MRN: [id number] Date of Birth: 07/26/1930   Medicare Important Message Given:  Yes  Spoke with patient's wife upon request to review.  Requested a copy of Medicare IM to be mailed to her attention.  Confirmed address.  Dannette Barbara 03/28/2019, 11:14 AM

## 2019-03-28 NOTE — Progress Notes (Signed)
Report called to [facility], EMS called for transportation, patient awaiting arrival.

## 2019-03-28 NOTE — TOC Transition Note (Signed)
Transition of Care [facility]) - CM/SW Discharge Note   Patient Details  Name: Tanvir Cirrincione. MRN: [id number] Date of Birth: October 03, 1930  Transition of [facility]) CM/SW Contact:  Candie Chroman, LCSW Phone Number: 03/28/2019, 1:09 PM   Clinical Narrative:  Patient has orders to discharge to [facility] today. RN will call report to [phone] prior to setting up EMS transport. No further concerns. CSW signing off.   Final next level of care: [place] Barriers to Discharge: Barriers Resolved   Patient Goals and CMS Choice Patient states their goals for this hospitalization and ongoing recovery are:: Get back home to his family   Choice offered to / list presented to : NA  Discharge Placement   Existing PASRR number confirmed : 03/27/19          Patient chooses bed at: [facility] Patient to be transferred to facility by: EMS Name of family member notified: Winford Wacha Patient and family notified of of transfer: 03/28/19  Discharge Plan and Services                                     Social Determinants of Health (SDOH) Interventions     Readmission Risk Interventions No flowsheet data found.

## 2019-03-30 LAB — CULTURE, BLOOD (ROUTINE X 2)
Culture: NO GROWTH
Culture: NO GROWTH
Special Requests: ADEQUATE
Special Requests: ADEQUATE

## 2019-03-30 LAB — VITAMIN C: Vitamin C: 0.1 mg/dL — ABNORMAL LOW (ref 0.4–2.0)

## 2019-04-02 ENCOUNTER — Other Ambulatory Visit: Payer: Self-pay

## 2019-04-02 ENCOUNTER — Encounter: Payer: Medicare Other | Attending: Physician Assistant | Admitting: Physician Assistant

## 2019-04-02 DIAGNOSIS — Z8249 Family history of ischemic heart disease and other diseases of the circulatory system: Secondary | ICD-10-CM | POA: Insufficient documentation

## 2019-04-02 DIAGNOSIS — Z8551 Personal history of malignant neoplasm of bladder: Secondary | ICD-10-CM | POA: Diagnosis not present

## 2019-04-02 DIAGNOSIS — I771 Stricture of artery: Secondary | ICD-10-CM | POA: Insufficient documentation

## 2019-04-02 DIAGNOSIS — I872 Venous insufficiency (chronic) (peripheral): Secondary | ICD-10-CM | POA: Insufficient documentation

## 2019-04-02 DIAGNOSIS — I10 Essential (primary) hypertension: Secondary | ICD-10-CM | POA: Insufficient documentation

## 2019-04-02 DIAGNOSIS — I890 Lymphedema, not elsewhere classified: Secondary | ICD-10-CM | POA: Insufficient documentation

## 2019-04-02 DIAGNOSIS — Z87891 Personal history of nicotine dependence: Secondary | ICD-10-CM | POA: Diagnosis not present

## 2019-04-02 DIAGNOSIS — Z6841 Body Mass Index (BMI) 40.0 and over, adult: Secondary | ICD-10-CM | POA: Diagnosis not present

## 2019-04-02 DIAGNOSIS — L97822 Non-pressure chronic ulcer of other part of left lower leg with fat layer exposed: Secondary | ICD-10-CM | POA: Insufficient documentation

## 2019-04-02 DIAGNOSIS — I2510 Atherosclerotic heart disease of native coronary artery without angina pectoris: Secondary | ICD-10-CM | POA: Insufficient documentation

## 2019-04-02 DIAGNOSIS — Z809 Family history of malignant neoplasm, unspecified: Secondary | ICD-10-CM | POA: Diagnosis not present

## 2019-04-02 DIAGNOSIS — M1990 Unspecified osteoarthritis, unspecified site: Secondary | ICD-10-CM | POA: Insufficient documentation

## 2019-04-02 NOTE — Progress Notes (Signed)
JARRETT, AMESBURY ([id number]) Visit Report for 04/02/2019 Abuse/Suicide Risk Screen Details Patient Name: Steven Meza, Steven Meza Date of Service: 04/02/2019 9:45 AM Medical Record Number: [id number] Patient Account Number: [id number] Date of Birth/Sex: 11/23/1930 (85 y.o. M) Treating RN: Army Melia Primary Care Laporchia Nakajima: Fulton Reek Other Clinician: Referring Levora Werden: Fulton Reek Treating Sabrina Keough/Extender: Melburn Hake, HOYT Weeks in Treatment: 0 Abuse/Suicide Risk Screen Items Answer ABUSE RISK SCREEN: Has anyone close to you tried to hurt or harm you recentlyo No Do you feel uncomfortable with anyone in your familyo No Has anyone forced you do things that you didnot want to doo No Electronic Signature(s) Signed: 04/02/2019 12:57:44 PM By: Army Melia Entered By: Army Melia on 04/02/2019 10:00:39 Steven Meza ([id number]) -------------------------------------------------------------------------------- Activities of Daily Living Details Patient Name: Steven Meza Date of Service: 04/02/2019 9:45 AM Medical Record Number: [id number] Patient Account Number: [id number] Date of Birth/Sex: [birth year] (85 y.o. M) Treating RN: Army Melia Primary Care Cira Deyoe: Fulton Reek Other Clinician: Referring Johnny Latu: Fulton Reek Treating Tristyn Pharris/Extender: Melburn Hake, HOYT Weeks in Treatment: 0 Activities of Daily Living Items Answer Activities of Daily Living (Please select one for each item) Drive Automobile Not Able Take Medications Completely Able Use Telephone Completely Able Care for Appearance Completely Able Use Toilet Need Assistance Bath / Shower Need Assistance Dress Self Need Assistance Feed Self Completely Able Walk Need Assistance Get In / Out Bed Need Assistance Housework Need Assistance Prepare Meals Completely [place] for Self Completely Able Electronic Signature(s) Signed: 04/02/2019 12:57:44 PM By: Army Melia Entered By: Army Melia on 04/02/2019 10:00:57 Steven Meza ([id number]) -------------------------------------------------------------------------------- Education Screening Details Patient Name: Steven Meza Date of Service: 04/02/2019 9:45 AM Medical Record Number: [id number] Patient Account Number: [id number] Date of Birth/Sex: 1930/06/25 (85 y.o. M) Treating RN: Army Melia Primary Care Arcelia Pals: Fulton Reek Other Clinician: Referring Charo Philipp: Fulton Reek Treating Eleah Lahaie/Extender: Melburn Hake, HOYT Weeks in Treatment: 0 Primary Learner Assessed: Patient Learning Preferences/Education Level/Primary Language Learning Preference: Explanation Highest Education Level: College or Above Preferred Language: English Cognitive Barrier Language Barrier: No Translator Needed: No Memory Deficit: No Emotional Barrier: No Cultural/Religious Beliefs Affecting Medical Care: No Physical Barrier Impaired Vision: No Impaired Hearing: No Decreased Hand dexterity: No Knowledge/Comprehension Knowledge Level: High Comprehension Level: High Ability to understand written High instructions: Ability to understand verbal High instructions: Motivation Anxiety Level: Calm Cooperation: Cooperative Education Importance: Acknowledges Need Interest in Health Problems: Asks Questions Perception: Coherent Willingness to Engage in Self- High Management Activities: Readiness to Engage in Self- High Management Activities: Electronic Signature(s) Signed: 04/02/2019 12:57:44 PM By: Army Melia Entered By: Army Melia on 04/02/2019 10:01:15 Steven Meza ([id number]) -------------------------------------------------------------------------------- Fall Risk Assessment Details Patient Name: Steven Meza Date of Service: 04/02/2019 9:45 AM Medical Record Number: [id number] Patient Account Number: [id number] Date of Birth/Sex: [birth year] (85 y.o. M) Treating RN: Army Melia Primary Care Teasha Murrillo: Fulton Reek Other Clinician: Referring Farid Grigorian: Fulton Reek Treating Melvin Whiteford/Extender: Melburn Hake, HOYT Weeks in Treatment: 0 Fall Risk Assessment Items Have you had 2 or more falls in the last 12 monthso 0 No Have you had any fall that resulted in injury in the last 12 monthso 0 No FALLS RISK SCREEN History of falling - immediate or within 3 months 0 No Secondary diagnosis (Do you have 2 or more medical diagnoseso) 0 No Ambulatory aid None/bed rest/wheelchair/nurse 0 No Crutches/cane/walker 0 No Furniture 0 No Intravenous therapy Access/Saline/Heparin Lock 0 No Gait/Transferring Normal/ bed rest/ wheelchair 0 No Weak (  short steps with or without shuffle, stooped but able to lift head while 10 Yes walking, may seek support from furniture) Impaired (short steps with shuffle, may have difficulty arising from chair, head 20 Yes down, impaired balance) Mental Status Oriented to own ability 0 No Electronic Signature(s) Signed: 04/02/2019 12:57:44 PM By: Army Melia Entered By: Army Melia on 04/02/2019 10:01:40 Steven Meza ([id number]) -------------------------------------------------------------------------------- Foot Assessment Details Patient Name: Steven Meza Date of Service: 04/02/2019 9:45 AM Medical Record Number: [id number] Patient Account Number: [id number] Date of Birth/Sex: Feb 12, 1931 (85 y.o. M) Treating RN: Army Melia Primary Care Kailia Starry: Fulton Reek Other Clinician: Referring Telena Peyser: Fulton Reek Treating Kollyn Lingafelter/Extender: Melburn Hake, HOYT Weeks in Treatment: 0 Foot Assessment Items Site Locations + = Sensation present, - = Sensation absent, C = Callus, U = Ulcer R = Redness, W = Warmth, M = Maceration, PU = Pre-ulcerative lesion F = Fissure, S = Swelling, D = Dryness Assessment Right: Left: Other Deformity: No No Prior Foot Ulcer: No No Prior Amputation: No No Charcot Joint: No No Ambulatory  Status: Ambulatory With Help Assistance Device: Wheelchair Gait: Buyer, retail Signature(s) Signed: 04/02/2019 12:57:44 PM By: Army Melia Entered By: Army Melia on 04/02/2019 10:02:11 Steven Meza ([id number]) -------------------------------------------------------------------------------- Nutrition Risk Screening Details Patient Name: Steven Meza Date of Service: 04/02/2019 9:45 AM Medical Record Number: [id number] Patient Account Number: [id number] Date of Birth/Sex: 1931/01/04 (85 y.o. M) Treating RN: Army Melia Primary Care Buck Mcaffee: Fulton Reek Other Clinician: Referring Sofhia Ulibarri: Fulton Reek Treating Denaja Verhoeven/Extender: Melburn Hake, HOYT Weeks in Treatment: 0 Height (in): 65 Weight (lbs): 273 Body Mass Index (BMI): 45.4 Nutrition Risk Screening Items Score Screening NUTRITION RISK SCREEN: I have an illness or condition that made me change the kind and/or amount of 0 No food I eat I eat fewer than two meals per day 0 No I eat few fruits and vegetables, or milk products 0 No I have three or more drinks of beer, liquor or wine almost every day 0 No I have tooth or mouth problems that make it hard for me to eat 0 No I don't always have enough money to buy the food I need 0 No I eat alone most of the time 0 No I take three or more different prescribed or over-the-counter drugs a day 0 No Without wanting to, I have lost or gained 10 pounds in the last six months 0 No I am not always physically able to shop, cook and/or feed myself 0 No Nutrition Protocols Good Risk Protocol 0 No interventions needed Moderate Risk Protocol High Risk Proctocol Risk Level: Good Risk Score: 0 Electronic Signature(s) Signed: 04/02/2019 12:57:44 PM By: Army Melia Entered By: Army Melia on 04/02/2019 10:01:46

## 2019-04-04 NOTE — Progress Notes (Signed)
KALEEM, LORES ([id number]) Visit Report for 04/02/2019 Allergy List Details Patient Name: Steven Meza, Steven Meza Date of Service: 04/02/2019 9:45 AM Medical Record Number: [id number] Patient Account Number: [id number] Date of Birth/Sex: 06-19-1930 (85 y.o. M) Treating RN: Army Melia Primary Care Ekin Pilar: Fulton Reek Other Clinician: Referring Deland Slocumb: Fulton Reek Treating Dyanna Seiter/Extender: STONE III, HOYT Weeks in Treatment: 0 Allergies Active Allergies No Known Allergies Allergy Notes Electronic Signature(s) Signed: 04/02/2019 12:57:44 PM By: Army Melia Entered By: Army Melia on 04/02/2019 09:58:35 Steven Meza ([id number]) -------------------------------------------------------------------------------- Arrival Information Details Patient Name: Steven Meza Date of Service: 04/02/2019 9:45 AM Medical Record Number: [id number] Patient Account Number: [id number] Date of Birth/Sex: 23-Jan-1931 (85 y.o. M) Treating RN: Army Melia Primary Care Alban Marucci: Fulton Reek Other Clinician: Referring Aerie Donica: Fulton Reek Treating Mariko Nowakowski/Extender: Melburn Hake, HOYT Weeks in Treatment: 0 Visit Information Patient Arrived: Wheel Chair Arrival Time: 09:42 Accompanied By: self Transfer Assistance: None Patient Identification Verified: Yes History Since Last Visit Added or deleted any medications: No Any new allergies or adverse reactions: No Had a fall or experienced change in activities of daily living that may affect risk of falls: No Signs or symptoms of abuse/neglect since last visito No Hospitalized since last visit: No Has Dressing in Place as Prescribed: Yes Electronic Signature(s) Signed: 04/02/2019 12:57:44 PM By: Army Melia Entered By: Army Melia on 04/02/2019 09:42:55 Steven Meza ([id number]) -------------------------------------------------------------------------------- Clinic Level of Care Assessment Details Patient Name: Steven Meza Date of Service: 04/02/2019 9:45 AM Medical Record Number: [id number] Patient Account Number: [id number] Date of Birth/Sex: [birth year] (85 y.o. M) Treating RN: Harold Barban Primary Care Marlita Keil: Fulton Reek Other Clinician: Referring Taegan Haider: Fulton Reek Treating Chanique Duca/Extender: Melburn Hake, HOYT Weeks in Treatment: 0 Clinic Level of Care Assessment Items TOOL 4 Quantity Score []  - Use when only an EandM is performed on FOLLOW-UP visit 0 ASSESSMENTS - Nursing Assessment / Reassessment X - Reassessment of Co-morbidities (includes updates in patient status) 1 10 X- 1 5 Reassessment of Adherence to Treatment Plan ASSESSMENTS - Wound and Skin Assessment / Reassessment X - Simple Wound Assessment / Reassessment - one wound 1 5 []  - 0 Complex Wound Assessment / Reassessment - multiple wounds []  - 0 Dermatologic / Skin Assessment (not related to wound area) ASSESSMENTS - Focused Assessment []  - Circumferential Edema Measurements - multi extremities 0 []  - 0 Nutritional Assessment / Counseling / Intervention []  - 0 Lower Extremity Assessment (monofilament, tuning fork, pulses) []  - 0 Peripheral Arterial Disease Assessment (using hand held doppler) ASSESSMENTS - Ostomy and/or Continence Assessment and Care []  - Incontinence Assessment and Management 0 []  - 0 Ostomy Care Assessment and Management (repouching, etc.) PROCESS - Coordination of Care X - Simple Patient / Family Education for ongoing care 1 15 []  - 0 Complex (extensive) Patient / Family Education for ongoing care []  - 0 Staff obtains Programmer, systems, Records, Test Results / Process Orders X- 1 10 Staff telephones HHA, Nursing Homes / Clarify orders / etc []  - 0 Routine Transfer to another Facility (non-emergent condition) []  - 0 Routine Hospital Admission (non-emergent condition) []  - 0 New Admissions / Biomedical engineer / Ordering NPWT, Apligraf, etc. []  - 0 Emergency Hospital Admission  (emergent condition) X- 1 10 Simple Discharge Coordination THIBAULT, ULLOM ([id number]) []  - 0 Complex (extensive) Discharge Coordination PROCESS - Special Needs []  - Pediatric / Minor Patient Management 0 []  - 0 Isolation Patient Management []  - 0 Hearing / Language / Visual special needs []  - 0 Assessment  of Community assistance (transportation, D/C planning, etc.) []  - 0 Additional assistance / Altered mentation []  - 0 Support Surface(s) Assessment (bed, cushion, seat, etc.) INTERVENTIONS - Wound Cleansing / Measurement X - Simple Wound Cleansing - one wound 1 5 []  - 0 Complex Wound Cleansing - multiple wounds X- 1 5 Wound Imaging (photographs - any number of wounds) []  - 0 Wound Tracing (instead of photographs) X- 1 5 Simple Wound Measurement - one wound []  - 0 Complex Wound Measurement - multiple wounds INTERVENTIONS - Wound Dressings X - Small Wound Dressing one or multiple wounds 1 10 []  - 0 Medium Wound Dressing one or multiple wounds []  - 0 Large Wound Dressing one or multiple wounds []  - 0 Application of Medications - topical []  - 0 Application of Medications - injection INTERVENTIONS - Miscellaneous []  - External ear exam 0 []  - 0 Specimen Collection (cultures, biopsies, blood, body fluids, etc.) []  - 0 Specimen(s) / Culture(s) sent or taken to Lab for analysis []  - 0 Patient Transfer (multiple staff / Civil Service fast streamer / Similar devices) []  - 0 Simple Staple / Suture removal (25 or less) []  - 0 Complex Staple / Suture removal (26 or more) []  - 0 Hypo / Hyperglycemic Management (close monitor of Blood Glucose) []  - 0 Ankle / Brachial Index (ABI) - do not check if billed separately X- 1 5 Vital Signs MOBEEN, KUTZNER ([id number]) Has the patient been seen at the hospital within the last three years: Yes Total Score: 85 Level Of Care: New/Established - Level 3 Electronic Signature(s) Signed: 04/04/2019 4:10:47 PM By: Harold Barban Entered By: Harold Barban on 04/02/2019 10:15:14 Steven Meza ([id number]) -------------------------------------------------------------------------------- Encounter Discharge Information Details Patient Name: Steven Meza Date of Service: 04/02/2019 9:45 AM Medical Record Number: [id number] Patient Account Number: [id number] Date of Birth/Sex: [birth year] (85 y.o. M) Treating RN: Harold Barban Primary Care Rondi Ivy: Fulton Reek Other Clinician: Referring Jermain Curt: Fulton Reek Treating Sakoya Win/Extender: Melburn Hake, HOYT Weeks in Treatment: 0 Encounter Discharge Information Items Discharge Condition: Stable Ambulatory Status: Wheelchair Discharge Destination: Skilled Nursing Facility Telephoned: No Orders Sent: Yes Transportation: Private Auto Accompanied By: self Schedule Follow-up Appointment: Yes Clinical Summary of Care: Electronic Signature(s) Signed: 04/04/2019 4:10:47 PM By: Harold Barban Entered By: Harold Barban on 04/02/2019 10:35:52 Steven Meza ([id number]) -------------------------------------------------------------------------------- Lower Extremity Assessment Details Patient Name: Steven Meza Date of Service: 04/02/2019 9:45 AM Medical Record Number: [id number] Patient Account Number: [id number] Date of Birth/Sex: [birth year] (85 y.o. M) Treating RN: Army Melia Primary Care Demoni Gergen: Fulton Reek Other Clinician: Referring Candace Begue: Fulton Reek Treating Horacio Werth/Extender: Melburn Hake, HOYT Weeks in Treatment: 0 Edema Assessment Assessed: [Left: No] [Right: No] Edema: [Left: Yes] [Right: No] Calf Left: Right: Point of Measurement: 33 cm From Medial Instep 44 cm 39 cm Ankle Left: Right: Point of Measurement: 12 cm From Medial Instep 26.5 cm 26.5 cm Vascular Assessment Pulses: Dorsalis Pedis Palpable: [Left:Yes] [Right:Yes] Doppler Audible: [Left:Yes] [Right:Yes] Posterior Tibial Palpable: [Left:Yes Yes] [Right:Yes Yes] Notes Leg is too  painful to complete ABI measurement Electronic Signature(s) Signed: 04/02/2019 12:57:44 PM By: Army Melia Entered By: Army Melia on 04/02/2019 09:58:30 Steven Meza ([id number]) -------------------------------------------------------------------------------- Multi Wound Chart Details Patient Name: Steven Meza Date of Service: 04/02/2019 9:45 AM Medical Record Number: [id number] Patient Account Number: [id number] Date of Birth/Sex: 1930-05-08 (85 y.o. M) Treating RN: Harold Barban Primary Care Rhiannon Sassaman: Fulton Reek Other Clinician: Referring Wesly Whisenant: Fulton Reek Treating Doral Ventrella/Extender: Melburn Hake, HOYT Weeks in Treatment: 0 Vital Signs Height(in):  65 Pulse(bpm): 92 Weight(lbs): 273 Blood Pressure(mmHg): 151/61 Body Mass Index(BMI): 45 Temperature(F): 98.8 Respiratory Rate 16 (breaths/min): Photos: [N/A:N/A] Wound Location: Left Lower Leg - N/A N/A Circumferential Wounding Event: Gradually Appeared N/A N/A Primary Etiology: Venous Leg Ulcer N/A N/A Date Acquired: 03/02/2019 N/A N/A Weeks of Treatment: 0 N/A N/A Wound Status: Open N/A N/A Measurements L x W x D 13x27x0.1 N/A N/A (cm) Area (cm) : 275.675 N/A N/A Volume (cm) : 27.567 N/A N/A Classification: Partial Thickness N/A N/A Exudate Amount: Large N/A N/A Exudate Type: Sanguinous N/A N/A Exudate Color: red N/A N/A Granulation Amount: Large (67-100%) N/A N/A Granulation Quality: Red N/A N/A Necrotic Amount: Small (1-33%) N/A N/A Exposed Structures: Fat Layer (Subcutaneous N/A N/A Tissue) Exposed: Yes LACOREY, NADEEM ([id number]) Fascia: No Tendon: No Muscle: No Joint: No Bone: No Epithelialization: None N/A N/A Treatment Notes Electronic Signature(s) Signed: 04/04/2019 4:10:47 PM By: Harold Barban Entered By: Harold Barban on 04/02/2019 10:13:11 Steven Meza ([id number]) -------------------------------------------------------------------------------- Multi-Disciplinary  Care Plan Details Patient Name: Steven Meza Date of Service: 04/02/2019 9:45 AM Medical Record Number: [id number] Patient Account Number: [id number] Date of Birth/Sex: May 10, 1930 (85 y.o. M) Treating RN: Harold Barban Primary Care Mayank Teuscher: Fulton Reek Other Clinician: Referring Oluwanifemi Petitti: Fulton Reek Treating Morine Kohlman/Extender: Melburn Hake, HOYT Weeks in Treatment: 0 Active Inactive Electronic Signature(s) Signed: 04/04/2019 4:10:47 PM By: Harold Barban Entered By: Harold Barban on 04/02/2019 10:12:27 Steven Meza ([id number]) -------------------------------------------------------------------------------- Pain Assessment Details Patient Name: Steven Meza Date of Service: 04/02/2019 9:45 AM Medical Record Number: [id number] Patient Account Number: [id number] Date of Birth/Sex: 10/25/1930 (85 y.o. M) Treating RN: Army Melia Primary Care Jestine Bicknell: Fulton Reek Other Clinician: Referring Adom Schoeneck: Fulton Reek Treating Sheniqua Carolan/Extender: Melburn Hake, HOYT Weeks in Treatment: 0 Active Problems Location of Pain Severity and Description of Pain Patient Has Paino No Site Locations Pain Management and Medication Current Pain Management: Electronic Signature(s) Signed: 04/02/2019 12:57:44 PM By: Army Melia Entered By: Army Melia on 04/02/2019 09:43:25 Steven Meza ([id number]) -------------------------------------------------------------------------------- Patient/Caregiver Education Details Patient Name: Steven Meza Date of Service: 04/02/2019 9:45 AM Medical Record Number: [id number] Patient Account Number: [id number] Date of Birth/Gender: Dec 15, 1930 (85 y.o. M) Treating RN: Harold Barban Primary Care Physician: Fulton Reek Other Clinician: Referring Physician: Fulton Reek Treating Physician/Extender: Melburn Hake, HOYT Weeks in Treatment: 0 Education Assessment Education Provided To: Patient Education Topics  Provided Venous: Handouts: Controlling Swelling with Multilayered Compression Wraps Methods: Demonstration, Explain/Verbal Responses: State content correctly Wound/Skin Impairment: Handouts: Caring for Your Ulcer Methods: Demonstration, Explain/Verbal Responses: State content correctly Electronic Signature(s) Signed: 04/04/2019 4:10:47 PM By: Harold Barban Entered By: Harold Barban on 04/02/2019 10:13:47 Steven Meza ([id number]) -------------------------------------------------------------------------------- Wound Assessment Details Patient Name: Steven Meza Date of Service: 04/02/2019 9:45 AM Medical Record Number: [id number] Patient Account Number: [id number] Date of Birth/Sex: [birth year] (85 y.o. M) Treating RN: Army Melia Primary Care Mckale Haffey: Fulton Reek Other Clinician: Referring Woodie Trusty: Fulton Reek Treating Lydia Toren/Extender: Melburn Hake, HOYT Weeks in Treatment: 0 Wound Status Wound Number: 6 Primary Etiology: Venous Leg Ulcer Wound Location: Left Lower Leg - Circumferential Wound Status: Open Wounding Event: Gradually Appeared Date Acquired: 03/02/2019 Weeks Of Treatment: 0 Clustered Wound: No Photos Wound Measurements Length: (cm) 13 Width: (cm) 27 Depth: (cm) 0.1 Area: (cm) 275.675 Volume: (cm) 27.567 % Reduction in Area: % Reduction in Volume: Epithelialization: None Tunneling: No Undermining: No Wound Description Classification: Partial Thickness Exudate Amount: Large Exudate Type: Sanguinous Exudate Color: red Foul Odor After Cleansing: No Slough/Fibrino Yes Wound Bed Granulation Amount:  Large (67-100%) Exposed Structure Granulation Quality: Red Fascia Exposed: No Necrotic Amount: Small (1-33%) Fat Layer (Subcutaneous Tissue) Exposed: Yes Necrotic Quality: Adherent Slough Tendon Exposed: No Muscle Exposed: No Joint Exposed: No Bone Exposed: No Treatment Notes Wound #6 (Left, Circumferential Lower Leg) ACHILLIES, ALLEGRA ([id number]) Notes adaptic, xtrasorb, abd, 3 layer with unna to anchor Electronic Signature(s) Signed: 04/02/2019 12:57:44 PM By: Army Melia Entered By: Army Melia on 04/02/2019 09:56:36 Steven Meza ([id number]) -------------------------------------------------------------------------------- Vitals Details Patient Name: Steven Meza Date of Service: 04/02/2019 9:45 AM Medical Record Number: [id number] Patient Account Number: [id number] Date of Birth/Sex: 1931/05/01 (85 y.o. M) Treating RN: Army Melia Primary Care Trueman Worlds: Fulton Reek Other Clinician: Referring Elexa Kivi: Fulton Reek Treating [place]ha Behlke/Extender: Melburn Hake, HOYT Weeks in Treatment: 0 Vital Signs Time Taken: 09:43 Temperature (F): 98.8 Height (in): 65 Pulse (bpm): 92 Source: Stated Respiratory Rate (breaths/min): 16 Weight (lbs): 273 Blood Pressure (mmHg): 151/61 Source: Stated Reference Range: 80 - 120 mg / dl Body Mass Index (BMI): 45.4 Electronic Signature(s) Signed: 04/02/2019 12:57:44 PM By: Army Melia Entered By: Army Melia on 04/02/2019 09:44:38

## 2019-04-04 NOTE — Progress Notes (Signed)
LEODAN, GERARD ([id number]) Visit Report for 04/02/2019 Chief Complaint Document Details Patient Name: Steven Meza, Steven Meza Date of Service: 04/02/2019 9:45 AM Medical Record Number: [id number] Patient Account Number: [id number] Date of Birth/Sex: 1931-03-01 (85 y.o. M) Treating RN: Harold Barban Primary Care Provider: Fulton Reek Other Clinician: Referring Provider: Fulton Reek Treating Provider/Extender: Melburn Hake, Rolanda Campa Weeks in Treatment: 0 Information Obtained from: Patient Chief Complaint Left LE Ulcer Electronic Signature(s) Signed: 04/03/2019 5:47:36 PM By: Worthy Keeler PA-C Entered By: Worthy Keeler on 04/02/2019 10:05:46 Steven Meza ([id number]) -------------------------------------------------------------------------------- HPI Details Patient Name: Steven Meza Date of Service: 04/02/2019 9:45 AM Medical Record Number: [id number] Patient Account Number: [id number] Date of Birth/Sex: 1931-03-06 (85 y.o. M) Treating RN: Harold Barban Primary Care Provider: Fulton Reek Other Clinician: Referring Provider: Fulton Reek Treating Provider/Extender: Melburn Hake, Humaira Sculley Weeks in Treatment: 0 History of Present Illness HPI Description: ADMISSION 05/17/2018 Mr. Rasberry is an 85 year old man who is been treated at the lymphedema clinic for a long period with lymphedema wraps for bilateral lower extremity lymphedema. About 6 months ago they noted small open areas on his left lateral calf just above the ankle. These were open. They are apparently putting some form of ointment on this. They have been referred here for our review of this. The patient has a long history of lymphedema and venous stasis. It says in his records that he has recurrent cellulitis although his wife denies this but she states that the lymphedema may have started with cellulitis several years ago. Also in his record there is a history of bladder cancer which they seem to know very little  about however he did not have any radiation to the pelvis or anything that could have contributed to lymphedema that they are aware of. There is no prior wound history. The patient has 2 small punched out areas on the left lateral calf that have adherent debris on the surface. Past medical history; lymphedema, hypertension, cellulitis, hearing loss, morbid obesity, osteoarthritis, venous stasis, bladder CA, diverticulosis. ABIs in our clinic were 0.36 on the right and 0.57 on the left 05/24/2018; corrected age on this patient is 39 versus what I said last week. He has been for his arterial studies which predictably are not very good. On the right his ABI is 0.65. Monophasic waveforms noted at the right ankle including the posterior tibial and dorsalis pedis. He has triphasic waveforms of the common femoral and profunda femoris triphasic proximal SFA with monophasic distal FSA and popliteal artery. Monophasic tibial waveforms. On the left his ABI is 0.58 monophasic waveforms at the left ankle. Duplex of the left lower extremity demonstrated atherosclerotic change with monophasic waveforms throughout the left lower extremity. There was some concern about left iliac disease and potentially femoral-popliteal and tibial disease. The patient does not describe claudication although according to his wife he over emphasizes his activity. Patient states he is limited by pain in both knees His wounds are on the left lateral calf. 2 small areas with necrotic debris. We have been using silver collagen and light Ace wraps 05/31/2018; wounds on the left lateral calf in the setting of very significant lymphedema and probably PAD. We have been using silver collagen. The base of the small wound looks reasonably improved. I sent him to see Dr. Fletcher Anon however I see now he has an appointment with Dr. Alvester Chou on February 12 2/5; left lateral calf wound looks the same. His wife is doing a good job with her lymphedema wraps  and maintaining that the edema. He most likely has PAD and is due to see Dr. Gwenlyn Found of infectious disease on February 12 2/19; left lateral calf wounds look about the same. This looks like wound secondary to chronic venous insufficiency with lymphedema however the patient has very poor arterial studies. We referred him to Dr. Gwenlyn Found. Dr. Gwenlyn Found did not feel he needed to do anything from an arterial point of view. He did not address the question about the aggressiveness of compression. After some thoughts about this I elected to go ahead and put him in 3 layer compression which after all would be less compression then the lymphedema clinic was putting on this. I am hopeful that this should be enough to get some closure of the small wounds 2/26; left lateral calf wound letter this week. He tolerated the 3 layer compression well furthermore he is sleeping in a hospital bed which helps keep his legs up at night. The wound looks better measuring smaller especially in width 3/3; left lateral calf wound about the same size. The 3 layer compression has really helped get the edema down in his leg however. Using silver collagen 3/11; left lateral calf wound better looking wound surface but about the same size. We have been using 3 layer compression which is really helped get the edema down in the left leg. Even after Dr. Kennon Holter assessment I am reluctant to go to 4 layer compression. He is tolerating 3 layer compression well. We have been using silver collagen Steven Meza, Steven Meza ([id number]) 3/18-Patient returns for his left lateral calf wound which overall is looking better, slightly increased in size and dimensions and area, he is tolerating the 3 layer compression, has been dressed with silver collagen which we will continue 3/25oleft lateral calf wound much better looking. Size is smaller. He has been using silver collagen 4/1; gradually getting smaller in size. Using silver collagen with 3 layer  compression. I think the patient has some degree of PAD. He saw Dr. Gwenlyn Found in consultation, he did not specifically comment on whether he could tolerate 4 layer compression 4/8; the wound is slightly smaller in depth. We are using 3 layer compression with silver collagen. I had him seen for arterial insufficiency however his ABI was 0.57 in the left leg. Dr. Gwenlyn Found really did not seem to middle on the degree of compression he could tolerate 4/15; the wound is slightly smaller and slightly less deep. We have been using 3 layer compression with silver collagen. There is not an option for 4-layer compression here. 4/22; the patient is making decent progress on his wound on the left lateral calf however he arrives in with a superficial wound on the right anterior tibial area. Were not really sure how this happened. He had been using a stocking on the right leg. He has been using silver collagen on the wounds Amazing to me he is he appears to have compression pumps at home. I am not really sure I knew this before. In any case he has not been using them 4/29; left lateral calf wound continues to get gradually smaller. This looks like it is on its way to closure. oThe area on the right anterior tibial area is about the same in terms of size superficial without any depth. This was probably wrap injury oHe tells me that he has been using his compression pumps once a day for 1 hour without any pain 5/6; not as good today. Left lateral calf wound is actually larger  and he does not have quite as good edema control. The area on the right anterior tibia is about the same still superficial. He states he is using his compression pumps once a day. We are using 3 layer compression on the left and Curlex Coban on the right 5/13; the area on the right is very close to being healed. The area on the left still has some depth but has a healthy wound surface. He has excellent edema control bilaterally. Using Hydrofera  Blue 5/20-Patient returns in 1 week after being in 3 layer compression on the left, the left lateral calf ulcer is stable with some slough, the right anterior tibial ulcer is healed we have been using Kerlix Coban on that leg 5/27; Hydrofera Blue and 3 layer compression. Still some slough in the wound area and some nonviable edges around the wound. 6/3; this patient has lymphedema and chronic venous insufficiency. He had difficult to close wound on his left lateral calf. This is closed today. 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 Finally he has known PAD but he is managed to tolerate the compression we reporting on him and also using his compression pumps that he already had at home twice a day 6/10; we discharge this patient last week having finally close the small area in the left lateral calf. Apparently by Friday of last week this had opened and was draining. He is therefore back in clinic. His wife who is present also was concerned about an area on the right anterior tibia."o 6/17; the patient does not have an open area on the right leg. On the left he has a small circular area anteriorly and the same area laterally that he had last time. However both of these appear to be improved he has his Farrow wrap 30/40. It turns out that he is only using his external compression pumps once a day. I tried to get him up to twice a day today 6/24; the patient comes in with his external compression garment on the right leg/Farrow wrap. As far as we know no open wounds here. He has 2 areas that are smaller one on the left anterior pretibial and one on the left lateral calf which is the site of his initial wounds. I have successfully caught him into his external compression pumps twice a day 7/1; still using his external compression garment on the right leg. As far as we know there are no open wounds here. The area on the left lateral calf which was the site of his  initial wound has closed over. The area on the left anterior pretibial that we identified last week is still open. 7/8; as far as we know he has no open wounds on the right leg. He has his external compression garment on here. The original wound on the left lateral calf has closed over. He developed an area on the left anterior pretibial 2 weeks ago and seems to have 2 smaller areas on the proximal and distal lateral calf. We do not have his good edema control this week as I am used to seeing 7/15; the patient has still a linear area on the left anterior tibia. The area on the left lateral leg looked somewhat inflamed today but no cellulitis. He did have what looked to be a small pustule that I cultured for CandS although I did not give him empiric antibiotics. 7/22; small area on the left anterior tibia. The area on the  left lateral leg still looks inflamed. The pustule that I cultured last week grew Enterobacter and Pseudomonas. I prescribed Cipro they still have not picked that up. In any case the area actually looks fairly satisfactory. He has decent edema control using his compression pumps twice a day 7/29; the area on the left lateral leg looks a lot better than last week. Nothing is open here. He has an area just medial to the mid anterior tibia that is still open that is only open wound today. 8/5-Patient returns at 1 week for the left leg ulcer, which is actually looking better, he is also complaining of left plantar foot pain below the great toe. He does have a hematoma here DWANE, VONGUNTEN ([id number]) 8/12-Patient returns at 1 week, the left leg open area looks about the same if not slightly better, the left plantar hematoma under the great toe is about the same, patient apparently has not been resting his foot on anything for too long period of time and does not wear the [employer]ken[employer] shoes unless he walks outdoors which he does not do often 8/19; small wound on the left anterior  lower leg this is just about closed there is nothing on the left lateral leg. He is using his compression pumps He arrives in clinic today with a black eschar over the right first plantar metatarsal head. This was apparently a bruise that was identified by her nurses last week. He wears crocs on his feet. He also may rub these on a footboard at home 8/26; there is nothing open on the left lateral lower leg he has a small area just medial to the tibia. He is going to see his podiatrist Dr. Milinda Pointer about the area on the left plantar first metatarsal head. He is using his external compression pumps. He has a juxta lite on the right leg 9/2; there is nothing open on either side of her left lower leg. He went to see podiatry about the area on the left first metatarsal head. This was apparently debrided. He was given mupirocin and oral antibiotics. I do not believe he is offloading this at all. Readmission: 04/02/2019 upon evaluation today patient actually appears for reevaluation here in our clinic secondary to issues that he is having with his left lower extremity. He has been tolerating the dressing changes without complication. Fortunately there is no signs of active infection at this time. No fevers, chills, nausea, vomiting, or diarrhea. He has been in the hospital since we last saw him and then subsequently transferred back to [facility]. He tells me that they have been changing his dressing once a day and that he has had a significant amount of pain secondary to undergoing these dressing changes. Fortunately there is no evidence of active infection at this point which is good news. No fevers, chills, nausea, vomiting, or diarrhea. With that being said he wonders if we can go back to doing what we were doing before which was when he actually had the dressing changes performed once a week here in the clinic he tells me that he did very well with that. At that point he tolerated a 3 layer  compression wrap which was approved by vascular, Dr. Tyrell Antonio office, to be used previous although 4-layer compression wrap is probably too much for him. Electronic Signature(s) Signed: 04/03/2019 5:47:36 PM By: Worthy Keeler PA-C Entered By: Worthy Keeler on 04/02/2019 10:20:52 Steven Meza ([id number]) -------------------------------------------------------------------------------- Physical Exam Details Patient Name: Steven Meza  Date of Service: 04/02/2019 9:45 AM Medical Record Number: [id number] Patient Account Number: [id number] Date of Birth/Sex: [birth year] (85 y.o. M) Treating RN: Harold Barban Primary Care Provider: Fulton Reek Other Clinician: Referring Provider: Fulton Reek Treating Provider/Extender: Melburn Hake, Jewelz Ricklefs Weeks in Treatment: 0 Constitutional sitting or standing blood pressure is within target range for patient.. pulse regular and within target range for patient.Marland Kitchen respirations regular, non-labored and within target range for patient.Marland Kitchen temperature within target range for patient.. Well- nourished and well-hydrated in no acute distress. Eyes conjunctiva clear no eyelid edema noted. pupils equal round and reactive to light and accommodation. Ears, Nose, Mouth, and Throat no gross abnormality of ear auricles or external auditory canals. normal hearing noted during conversation. mucus membranes moist. Respiratory normal breathing without difficulty. clear to auscultation bilaterally. Cardiovascular regular rate and rhythm with normal S1, S2. Faint posterior tibial and dorsalis pedis pulses bilateral lower extremities. 1+ pitting edema of the bilateral lower extremities. Gastrointestinal (GI) soft, non-tender, non-distended, +BS. no ventral hernia noted. Musculoskeletal Patient unable to walk without assistance. no significant deformity or arthritic changes, no loss or range of motion, no clubbing. Psychiatric this patient is able to make  decisions and demonstrates good insight into disease process. Alert and Oriented x 3. pleasant and cooperative. Notes His wound bed currently actually showed signs of fairly good granulation at this time there is no significant slough buildup at any location and overall it seems healthy. He does have some edema but not too much even noted at this point. Fortunately there is no evidence of active infection at this time which is also good news. His blood pressure was in fairly decent shape as well. He was slightly elevated but again for him I do not think this is really too high. No sharp debridement nor cultures were needed today. Electronic Signature(s) Signed: 04/03/2019 5:47:36 PM By: Worthy Keeler PA-C Entered By: Worthy Keeler on 04/02/2019 10:22:34 Steven Meza ([id number]) -------------------------------------------------------------------------------- Physician Orders Details Patient Name: Steven Meza Date of Service: 04/02/2019 9:45 AM Medical Record Number: [id number] Patient Account Number: [id number] Date of Birth/Sex: 1931/03/02 (85 y.o. M) Treating RN: Harold Barban Primary Care Provider: Fulton Reek Other Clinician: Referring Provider: Fulton Reek Treating Provider/Extender: Melburn Hake, Abdelrahman Nair Weeks in Treatment: 0 Verbal / Phone Orders: No Diagnosis Coding ICD-10 Coding Meza Description I89.0 Lymphedema, not elsewhere classified I87.2 Venous insufficiency (chronic) (peripheral) L97.822 Non-pressure chronic ulcer of other part of left lower leg with fat layer exposed I10 Essential (primary) hypertension I25.10 Atherosclerotic heart disease of native coronary artery without angina pectoris Wound Cleansing Wound #6 Left,Circumferential Lower Leg o May shower with protection. o No tub bath. Primary Wound Dressing Wound #6 Left,Circumferential Lower Leg o Silver Alginate - Apply Adaptic directly to wound area then Silver Alginate on  top Secondary Dressing o Other - Adaptic Dressing Change Frequency Wound #6 Left,Circumferential Lower Leg o Change dressing every week - SNF Nurse to verify wrap integrity daily. No need to change, Patient come to Wound Clinic weekly for wrap changes. Follow-up Appointments Wound #6 Left,Circumferential Lower Leg o Return Appointment in 1 week. o Nurse Visit as needed - Return Monday December 9th for a Nurse Visit Edema Control Wound #6 Left,Circumferential Lower Leg o 3 Layer Compression System - Left Lower Extremity Electronic Signature(s) Signed: 04/03/2019 5:47:36 PM By: Worthy Keeler PA-C Signed: 04/04/2019 4:10:47 PM By: Harold Barban Entered By: Harold Barban on 04/02/2019 10:20:50 Steven Meza ([id number]) -------------------------------------------------------------------------------- Problem List Details Patient Name: JARRYD, CIESLINSKI  A. Date of Service: 04/02/2019 9:45 AM Medical Record Number: [id number] Patient Account Number: [id number] Date of Birth/Sex: May 03, 1931 (85 y.o. M) Treating RN: Harold Barban Primary Care Provider: Fulton Reek Other Clinician: Referring Provider: Fulton Reek Treating Provider/Extender: Melburn Hake, Dustyn Dansereau Weeks in Treatment: 0 Active Problems ICD-10 Evaluated Encounter Meza Description Active Date Today Diagnosis I89.0 Lymphedema, not elsewhere classified 04/02/2019 No Yes I87.2 Venous insufficiency (chronic) (peripheral) 04/02/2019 No Yes L97.822 Non-pressure chronic ulcer of other part of left lower leg with 04/02/2019 No Yes fat layer exposed I10 Essential (primary) hypertension 04/02/2019 No Yes I25.10 Atherosclerotic heart disease of native coronary artery 04/02/2019 No Yes without angina pectoris Inactive Problems Resolved Problems Electronic Signature(s) Signed: 04/03/2019 5:47:36 PM By: Worthy Keeler PA-C Entered By: Worthy Keeler on 04/02/2019 10:05:25 Steven Meza  ([id number]) -------------------------------------------------------------------------------- Progress Note Details Patient Name: Steven Meza Date of Service: 04/02/2019 9:45 AM Medical Record Number: [id number] Patient Account Number: [id number] Date of Birth/Sex: 1930/06/09 (85 y.o. M) Treating RN: Harold Barban Primary Care Provider: Fulton Reek Other Clinician: Referring Provider: Fulton Reek Treating Provider/Extender: Melburn Hake, Lakendra Helling Weeks in Treatment: 0 Subjective Chief Complaint Information obtained from Patient Left LE Ulcer History of Present Illness (HPI) ADMISSION 05/17/2018 Mr. Rackers is an 85 year old man who is been treated at the lymphedema clinic for a long period with lymphedema wraps for bilateral lower extremity lymphedema. About 6 months ago they noted small open areas on his left lateral calf just above the ankle. These were open. They are apparently putting some form of ointment on this. They have been referred here for our review of this. The patient has a long history of lymphedema and venous stasis. It says in his records that he has recurrent cellulitis although his wife denies this but she states that the lymphedema may have started with cellulitis several years ago. Also in his record there is a history of bladder cancer which they seem to know very little about however he did not have any radiation to the pelvis or anything that could have contributed to lymphedema that they are aware of. There is no prior wound history. The patient has 2 small punched out areas on the left lateral calf that have adherent debris on the surface. Past medical history; lymphedema, hypertension, cellulitis, hearing loss, morbid obesity, osteoarthritis, venous stasis, bladder CA, diverticulosis. ABIs in our clinic were 0.36 on the right and 0.57 on the left 05/24/2018; corrected age on this patient is 4 versus what I said last week. He has been for his arterial  studies which predictably are not very good. On the right his ABI is 0.65. Monophasic waveforms noted at the right ankle including the posterior tibial and dorsalis pedis. He has triphasic waveforms of the common femoral and profunda femoris triphasic proximal SFA with monophasic distal FSA and popliteal artery. Monophasic tibial waveforms. On the left his ABI is 0.58 monophasic waveforms at the left ankle. Duplex of the left lower extremity demonstrated atherosclerotic change with monophasic waveforms throughout the left lower extremity. There was some concern about left iliac disease and potentially femoral-popliteal and tibial disease. The patient does not describe claudication although according to his wife he over emphasizes his activity. Patient states he is limited by pain in both knees His wounds are on the left lateral calf. 2 small areas with necrotic debris. We have been using silver collagen and light Ace wraps 05/31/2018; wounds on the left lateral calf in the setting of very significant lymphedema and probably PAD.  We have been using silver collagen. The base of the small wound looks reasonably improved. I sent him to see Dr. Fletcher Anon however I see now he has an appointment with Dr. Alvester Chou on February 12 2/5; left lateral calf wound looks the same. His wife is doing a good job with her lymphedema wraps and maintaining that the edema. He most likely has PAD and is due to see Dr. Gwenlyn Found of infectious disease on February 12 2/19; left lateral calf wounds look about the same. This looks like wound secondary to chronic venous insufficiency with lymphedema however the patient has very poor arterial studies. We referred him to Dr. Gwenlyn Found. Dr. Gwenlyn Found did not feel he needed to do anything from an arterial point of view. He did not address the question about the aggressiveness of compression. After some thoughts about this I elected to go ahead and put him in 3 layer compression which after all  would be less compression then the lymphedema clinic was putting on this. I am hopeful that this should be enough to get some closure of the small wounds 2/26; left lateral calf wound letter this week. He tolerated the 3 layer compression well furthermore he is sleeping in a hospital Steven Meza, Steven A. ([id number]) bed which helps keep his legs up at night. The wound looks better measuring smaller especially in width 3/3; left lateral calf wound about the same size. The 3 layer compression has really helped get the edema down in his leg however. Using silver collagen 3/11; left lateral calf wound better looking wound surface but about the same size. We have been using 3 layer compression which is really helped get the edema down in the left leg. Even after Dr. Kennon Holter assessment I am reluctant to go to 4 layer compression. He is tolerating 3 layer compression well. We have been using silver collagen 3/18-Patient returns for his left lateral calf wound which overall is looking better, slightly increased in size and dimensions and area, he is tolerating the 3 layer compression, has been dressed with silver collagen which we will continue 3/25 left lateral calf wound much better looking. Size is smaller. He has been using silver collagen 4/1; gradually getting smaller in size. Using silver collagen with 3 layer compression. I think the patient has some degree of PAD. He saw Dr. Gwenlyn Found in consultation, he did not specifically comment on whether he could tolerate 4 layer compression 4/8; the wound is slightly smaller in depth. We are using 3 layer compression with silver collagen. I had him seen for arterial insufficiency however his ABI was 0.57 in the left leg. Dr. Gwenlyn Found really did not seem to middle on the degree of compression he could tolerate 4/15; the wound is slightly smaller and slightly less deep. We have been using 3 layer compression with silver collagen. There is not an option for 4-layer  compression here. 4/22; the patient is making decent progress on his wound on the left lateral calf however he arrives in with a superficial wound on the right anterior tibial area. Were not really sure how this happened. He had been using a stocking on the right leg. He has been using silver collagen on the wounds Amazing to me he is he appears to have compression pumps at home. I am not really sure I knew this before. In any case he has not been using them 4/29; left lateral calf wound continues to get gradually smaller. This looks like it is on its way to closure.  The area on the right anterior tibial area is about the same in terms of size superficial without any depth. This was probably wrap injury He tells me that he has been using his compression pumps once a day for 1 hour without any pain 5/6; not as good today. Left lateral calf wound is actually larger and he does not have quite as good edema control. The area on the right anterior tibia is about the same still superficial. He states he is using his compression pumps once a day. We are using 3 layer compression on the left and Curlex Coban on the right 5/13; the area on the right is very close to being healed. The area on the left still has some depth but has a healthy wound surface. He has excellent edema control bilaterally. Using Hydrofera Blue 5/20-Patient returns in 1 week after being in 3 layer compression on the left, the left lateral calf ulcer is stable with some slough, the right anterior tibial ulcer is healed we have been using Kerlix Coban on that leg 5/27; Hydrofera Blue and 3 layer compression. Still some slough in the wound area and some nonviable edges around the wound. 6/3; this patient has lymphedema and chronic venous insufficiency. He had difficult to close wound on his left lateral calf. This is closed today. 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 Finally he has known PAD  but he is managed to tolerate the compression we reporting on him and also using his compression pumps that he already had at home twice a day 6/10; we discharge this patient last week having finally close the small area in the left lateral calf. Apparently by Friday of last week this had opened and was draining. He is therefore back in clinic. His wife who is present also was concerned about an area on the right anterior tibia."o 6/17; the patient does not have an open area on the right leg. On the left he has a small circular area anteriorly and the same area laterally that he had last time. However both of these appear to be improved he has his Farrow wrap 30/40. It turns out that he is only using his external compression pumps once a day. I tried to get him up to twice a day today 6/24; the patient comes in with his external compression garment on the right leg/Farrow wrap. As far as we know no open wounds here. He has 2 areas that are smaller one on the left anterior pretibial and one on the left lateral calf which is the site of his initial wounds. I have successfully caught him into his external compression pumps twice a day 7/1; still using his external compression garment on the right leg. As far as we know there are no open wounds here. The area on the left lateral calf which was the site of his initial wound has closed over. The area on the left anterior pretibial that we identified last week is still open. 7/8; as far as we know he has no open wounds on the right leg. He has his external compression garment on here. The original wound on the left lateral calf has closed over. He developed an area on the left anterior pretibial 2 weeks ago and seems to have 2 smaller areas on the proximal and distal lateral calf. We do not have his good edema control this week as I am used to seeing 7/15; the patient has still a  linear area on the left anterior tibia. The area on the left lateral leg  looked somewhat inflamed today but no cellulitis. He did have what looked to be a small pustule that I cultured for CandS although I did not give him empiric antibiotics. 7/22; small area on the left anterior tibia. The area on the left lateral leg still looks inflamed. The pustule that I cultured last Steven Meza, Steven Meza. ([id number]) week grew Enterobacter and Pseudomonas. I prescribed Cipro they still have not picked that up. In any case the area actually looks fairly satisfactory. He has decent edema control using his compression pumps twice a day 7/29; the area on the left lateral leg looks a lot better than last week. Nothing is open here. He has an area just medial to the mid anterior tibia that is still open that is only open wound today. 8/5-Patient returns at 1 week for the left leg ulcer, which is actually looking better, he is also complaining of left plantar foot pain below the great toe. He does have a hematoma here 8/12-Patient returns at 1 week, the left leg open area looks about the same if not slightly better, the left plantar hematoma under the great toe is about the same, patient apparently has not been resting his foot on anything for too long period of time and does not wear the [employer]ken[employer] shoes unless he walks outdoors which he does not do often 8/19; small wound on the left anterior lower leg this is just about closed there is nothing on the left lateral leg. He is using his compression pumps He arrives in clinic today with a black eschar over the right first plantar metatarsal head. This was apparently a bruise that was identified by her nurses last week. He wears crocs on his feet. He also may rub these on a footboard at home 8/26; there is nothing open on the left lateral lower leg he has a small area just medial to the tibia. He is going to see his podiatrist Dr. Milinda Pointer about the area on the left plantar first metatarsal head. He is using his external compression pumps.  He has a juxta lite on the right leg 9/2; there is nothing open on either side of her left lower leg. He went to see podiatry about the area on the left first metatarsal head. This was apparently debrided. He was given mupirocin and oral antibiotics. I do not believe he is offloading this at all. Readmission: 04/02/2019 upon evaluation today patient actually appears for reevaluation here in our clinic secondary to issues that he is having with his left lower extremity. He has been tolerating the dressing changes without complication. Fortunately there is no signs of active infection at this time. No fevers, chills, nausea, vomiting, or diarrhea. He has been in the hospital since we last saw him and then subsequently transferred back to [facility]. He tells me that they have been changing his dressing once a day and that he has had a significant amount of pain secondary to undergoing these dressing changes. Fortunately there is no evidence of active infection at this point which is good news. No fevers, chills, nausea, vomiting, or diarrhea. With that being said he wonders if we can go back to doing what we were doing before which was when he actually had the dressing changes performed once a week here in the clinic he tells me that he did very well with that. At that point he tolerated a 3 layer  compression wrap which was approved by vascular, Dr. Tyrell Antonio office, to be used previous although 4-layer compression wrap is probably too much for him. Patient History Information obtained from Patient. Allergies No Known Allergies Family History Cancer - Mother, Diabetes - Siblings, Heart Disease - Mother, No family history of Hereditary Spherocytosis, Hypertension, Kidney Disease, Lung Disease, Seizures, Stroke, Thyroid Problems, Tuberculosis. Social History Former smoker, Alcohol Use - Moderate, Drug Use - No History, Caffeine Use - Daily. Medical History Eyes Denies history of Cataracts,  Glaucoma, Optic Neuritis Ear/Nose/Mouth/Throat Denies history of Chronic sinus problems/congestion, Middle ear problems Hematologic/Lymphatic Patient has history of Lymphedema Denies history of Anemia, Hemophilia, Human Immunodeficiency Virus, Sickle Cell Disease Respiratory Denies history of Aspiration, Asthma, Chronic Obstructive Pulmonary Disease (COPD), Pneumothorax, Sleep Apnea, Tuberculosis Steven Meza, Steven Meza ([id number]) Cardiovascular Patient has history of Coronary Artery Disease, Hypertension Denies history of Angina, Arrhythmia, Congestive Heart Failure, Deep Vein Thrombosis, Hypotension, Myocardial Infarction, Peripheral Arterial Disease, Peripheral Venous Disease, Phlebitis, Vasculitis Gastrointestinal Denies history of Cirrhosis , Colitis, Crohn s, Hepatitis A, Hepatitis B, Hepatitis C Endocrine Denies history of Type I Diabetes, Type II Diabetes Genitourinary Denies history of End Stage Renal Disease Immunological Denies history of Lupus Erythematosus, Raynaud s, Scleroderma Integumentary (Skin) Denies history of History of Burn, History of pressure wounds Musculoskeletal Denies history of Gout, Rheumatoid Arthritis, Osteoarthritis, Osteomyelitis Neurologic Denies history of Dementia, Neuropathy, Quadriplegia, Paraplegia, Seizure Disorder Oncologic Denies history of Received Chemotherapy, Received Radiation Medical And Surgical History Notes Ear/Nose/Mouth/Throat hoh Review of Systems (ROS) Eyes Denies complaints or symptoms of Dry Eyes, Vision Changes, Glasses / Contacts. Ear/Nose/Mouth/Throat Denies complaints or symptoms of Difficult clearing ears, Sinusitis. Hematologic/Lymphatic Denies complaints or symptoms of Bleeding / Clotting Disorders, Human Immunodeficiency Virus. Respiratory Denies complaints or symptoms of Chronic or frequent coughs, Shortness of Breath. Cardiovascular Denies complaints or symptoms of Chest pain, LE edema. Endocrine Denies  complaints or symptoms of Hepatitis, Thyroid disease, Polydypsia (Excessive Thirst). Genitourinary Denies complaints or symptoms of Kidney failure/ Dialysis, Incontinence/dribbling. Immunological Denies complaints or symptoms of Hives, Itching. Integumentary (Skin) Complains or has symptoms of Wounds. Denies complaints or symptoms of Bleeding or bruising tendency, Breakdown, Swelling. Musculoskeletal Denies complaints or symptoms of Muscle Pain, Muscle Weakness. Neurologic Denies complaints or symptoms of Numbness/parasthesias, Focal/Weakness. Psychiatric Denies complaints or symptoms of Anxiety, Claustrophobia. Steven Meza, Steven Meza ([id number]) Objective Constitutional sitting or standing blood pressure is within target range for patient.. pulse regular and within target range for patient.Marland Kitchen respirations regular, non-labored and within target range for patient.Marland Kitchen temperature within target range for patient.. Well- nourished and well-hydrated in no acute distress. Vitals Time Taken: 9:43 AM, Height: 65 in, Source: Stated, Weight: 273 lbs, Source: Stated, BMI: 45.4, Temperature: 98.8 F, Pulse: 92 bpm, Respiratory Rate: 16 breaths/min, Blood Pressure: 151/61 mmHg. Eyes conjunctiva clear no eyelid edema noted. pupils equal round and reactive to light and accommodation. Ears, Nose, Mouth, and Throat no gross abnormality of ear auricles or external auditory canals. normal hearing noted during conversation. mucus membranes moist. Respiratory normal breathing without difficulty. clear to auscultation bilaterally. Cardiovascular regular rate and rhythm with normal S1, S2. Faint posterior tibial and dorsalis pedis pulses bilateral lower extremities. 1+ pitting edema of the bilateral lower extremities. Gastrointestinal (GI) soft, non-tender, non-distended, +BS. no ventral hernia noted. Musculoskeletal Patient unable to walk without assistance. no significant deformity or arthritic changes, no  loss or range of motion, no clubbing. Psychiatric this patient is able to make decisions and demonstrates good insight into disease process. Alert and Oriented x 3.  pleasant and cooperative. General Notes: His wound bed currently actually showed signs of fairly good granulation at this time there is no significant slough buildup at any location and overall it seems healthy. He does have some edema but not too much even noted at this point. Fortunately there is no evidence of active infection at this time which is also good news. His blood pressure was in fairly decent shape as well. He was slightly elevated but again for him I do not think this is really too high. No sharp debridement nor cultures were needed today. Integumentary (Hair, Skin) Wound #6 status is Open. Original cause of wound was Gradually Appeared. The wound is located on the Left,Circumferential Lower Leg. The wound measures 13cm length x 27cm width x 0.1cm depth; 275.675cm^2 area and 27.567cm^3 volume. There is Fat Layer (Subcutaneous Tissue) Exposed exposed. There is no tunneling or undermining noted. There is a large amount of sanguinous drainage noted. There is large (67-100%) red granulation within the wound bed. There is a small (1-33%) amount of necrotic tissue within the wound bed including Adherent Slough. Assessment Active Problems ICD-10 Steven Meza, Steven Meza ([id number]) Lymphedema, not elsewhere classified Venous insufficiency (chronic) (peripheral) Non-pressure chronic ulcer of other part of left lower leg with fat layer exposed Essential (primary) hypertension Atherosclerotic heart disease of native coronary artery without angina pectoris Plan Wound Cleansing: Wound #6 Left,Circumferential Lower Leg: May shower with protection. No tub bath. Primary Wound Dressing: Wound #6 Left,Circumferential Lower Leg: Silver Alginate - Apply Adaptic directly to wound area then Silver Alginate on top Secondary  Dressing: Other - Adaptic Dressing Change Frequency: Wound #6 Left,Circumferential Lower Leg: Change dressing every week - SNF Nurse to verify wrap integrity daily. No need to change, Patient come to Wound Clinic weekly for wrap changes. Follow-up Appointments: Wound #6 Left,Circumferential Lower Leg: Return Appointment in 1 week. Nurse Visit as needed - Return Monday December 9th for a Nurse Visit Edema Control: Wound #6 Left,Circumferential Lower Leg: 3 Layer Compression System - Left Lower Extremity 1. My suggestion at this time is good to be that we go ahead and initiate treatment with Adaptic followed by silver alginate dressing to the wound beds to help with appropriate healing as far as the wounds are concerned. Also think that the patient is going to require ongoing treatment from the standpoint of compression therapy and again I did recommend at this time that we go ahead and continue with the 3 layer compression wrap that previously was very beneficial for the patient I am hopeful this will still be the case. 2. I would recommend as well that he attempt to elevate his legs as much as possible in order to help with edema control as well I still think this is very important. 3. I also recommend that we have him come in for weekly visits here in the clinic for his wrap changes since the 3 layer compression wrap cannot be performed at the facility. He is in agreement with this plan. We will actually see him for nurse visit next week and then I will see him for a provider visit the following. We will see patient back for reevaluation in 1 week here in the clinic. If anything worsens or changes patient will contact our office for additional recommendations. Electronic Signature(s) Signed: 04/03/2019 5:47:36 PM By: Worthy Keeler PA-C Entered By: Worthy Keeler on 04/02/2019 10:23:22 Steven Meza  ([id number]) -------------------------------------------------------------------------------- ROS/PFSH Details Patient Name: Steven Meza Date of Service: 04/02/2019 9:45  AM Medical Record Number: [id number] Patient Account Number: [id number] Date of Birth/Sex: 1931/03/30 (85 y.o. M) Treating RN: Army Melia Primary Care Provider: Fulton Reek Other Clinician: Referring Provider: Fulton Reek Treating Provider/Extender: Melburn Hake, Coda Mathey Weeks in Treatment: 0 Information Obtained From Patient Eyes Complaints and Symptoms: Negative for: Dry Eyes; Vision Changes; Glasses / Contacts Medical History: Negative for: Cataracts; Glaucoma; Optic Neuritis Ear/Nose/Mouth/Throat Complaints and Symptoms: Negative for: Difficult clearing ears; Sinusitis Medical History: Negative for: Chronic sinus problems/congestion; Middle ear problems Past Medical History Notes: hoh Hematologic/Lymphatic Complaints and Symptoms: Negative for: Bleeding / Clotting Disorders; Human Immunodeficiency Virus Medical History: Positive for: Lymphedema Negative for: Anemia; Hemophilia; Human Immunodeficiency Virus; Sickle Cell Disease Respiratory Complaints and Symptoms: Negative for: Chronic or frequent coughs; Shortness of Breath Medical History: Negative for: Aspiration; Asthma; Chronic Obstructive Pulmonary Disease (COPD); Pneumothorax; Sleep Apnea; Tuberculosis Cardiovascular Complaints and Symptoms: Negative for: Chest pain; LE edema Medical History: Positive for: Coronary Artery Disease; Hypertension Negative for: Angina; Arrhythmia; Congestive Heart Failure; Deep Vein Thrombosis; Hypotension; Myocardial Infarction; Peripheral Arterial Disease; Peripheral Venous Disease; Phlebitis; Vasculitis Endocrine Steven Meza, Steven Meza ([id number]) Complaints and Symptoms: Negative for: Hepatitis; Thyroid disease; Polydypsia (Excessive Thirst) Medical History: Negative for: Type I Diabetes; Type II  Diabetes Genitourinary Complaints and Symptoms: Negative for: Kidney failure/ Dialysis; Incontinence/dribbling Medical History: Negative for: End Stage Renal Disease Immunological Complaints and Symptoms: Negative for: Hives; Itching Medical History: Negative for: Lupus Erythematosus; Raynaudos; Scleroderma Integumentary (Skin) Complaints and Symptoms: Positive for: Wounds Negative for: Bleeding or bruising tendency; Breakdown; Swelling Medical History: Negative for: History of Burn; History of pressure wounds Musculoskeletal Complaints and Symptoms: Negative for: Muscle Pain; Muscle Weakness Medical History: Negative for: Gout; Rheumatoid Arthritis; Osteoarthritis; Osteomyelitis Neurologic Complaints and Symptoms: Negative for: Numbness/parasthesias; Focal/Weakness Medical History: Negative for: Dementia; Neuropathy; Quadriplegia; Paraplegia; Seizure Disorder Psychiatric Complaints and Symptoms: Negative for: Anxiety; Claustrophobia Gastrointestinal Medical History: Negative for: Cirrhosis ; Colitis; Crohnos; Hepatitis A; Hepatitis B; Hepatitis C Oncologic Steven Meza, Steven Meza ([id number]) Medical History: Negative for: Received Chemotherapy; Received Radiation Immunizations Pneumococcal Vaccine: Received Pneumococcal Vaccination: Yes Tetanus Vaccine: Last tetanus shot: 01/02/2018 Implantable Devices None Family and Social History Cancer: Yes - Mother; Diabetes: Yes - Siblings; Heart Disease: Yes - Mother; Hereditary Spherocytosis: No; Hypertension: No; Kidney Disease: No; Lung Disease: No; Seizures: No; Stroke: No; Thyroid Problems: No; Tuberculosis: No; Former smoker; Alcohol Use: Moderate; Drug Use: No History; Caffeine Use: Daily; Financial Concerns: No; Food, Clothing or Shelter Needs: No; Support System Lacking: No; Transportation Concerns: No Electronic Signature(s) Signed: 04/02/2019 12:57:44 PM By: Army Melia Signed: 04/03/2019 5:47:36 PM By: Worthy Keeler  PA-C Entered By: Army Melia on 04/02/2019 10:00:31 Steven Meza ([id number]) -------------------------------------------------------------------------------- SuperBill Details Patient Name: Steven Meza Date of Service: 04/02/2019 Medical Record Number: [id number] Patient Account Number: [id number] Date of Birth/Sex: 1930/06/03 (85 y.o. M) Treating RN: Harold Barban Primary Care Provider: Fulton Reek Other Clinician: Referring Provider: Fulton Reek Treating Provider/Extender: Melburn Hake, Arthella Headings Weeks in Treatment: 0 Diagnosis Coding ICD-10 Codes Meza Description I89.0 Lymphedema, not elsewhere classified I87.2 Venous insufficiency (chronic) (peripheral) L97.822 Non-pressure chronic ulcer of other part of left lower leg with fat layer exposed I10 Essential (primary) hypertension I25.10 Atherosclerotic heart disease of native coronary artery without angina pectoris Facility Procedures CPT4 Meza: [id number] Description: 99213 - WOUND CARE VISIT-LEV 3 EST PT Modifier: Quantity: 1 Physician Procedures CPT4 Meza Description: [id number] - WC PHYS LEVEL 4 - EST PT ICD-10 Diagnosis Description I89.0 Lymphedema, not elsewhere classified I87.2 Venous insufficiency (chronic) (peripheral) [id number]  Non-pressure chronic ulcer of other part of left lower leg  wit I10 Essential (primary) hypertension Modifier: h fat layer expos Quantity: 1 ed Electronic Signature(s) Signed: 04/03/2019 5:47:36 PM By: Worthy Keeler PA-C Entered By: Worthy Keeler on 04/02/2019 10:24:00

## 2019-04-09 ENCOUNTER — Encounter: Payer: Medicare Other | Attending: Physician Assistant

## 2019-04-09 ENCOUNTER — Other Ambulatory Visit: Payer: Self-pay

## 2019-04-09 DIAGNOSIS — I10 Essential (primary) hypertension: Secondary | ICD-10-CM | POA: Insufficient documentation

## 2019-04-09 DIAGNOSIS — I2510 Atherosclerotic heart disease of native coronary artery without angina pectoris: Secondary | ICD-10-CM | POA: Diagnosis not present

## 2019-04-09 DIAGNOSIS — L97822 Non-pressure chronic ulcer of other part of left lower leg with fat layer exposed: Secondary | ICD-10-CM | POA: Insufficient documentation

## 2019-04-09 DIAGNOSIS — I890 Lymphedema, not elsewhere classified: Secondary | ICD-10-CM | POA: Insufficient documentation

## 2019-04-09 DIAGNOSIS — L89329 Pressure ulcer of left buttock, unspecified stage: Secondary | ICD-10-CM | POA: Insufficient documentation

## 2019-04-09 DIAGNOSIS — I872 Venous insufficiency (chronic) (peripheral): Secondary | ICD-10-CM | POA: Insufficient documentation

## 2019-04-09 NOTE — Progress Notes (Signed)
Steven Meza, Steven Meza ([id number]) Visit Report for 04/09/2019 Arrival Information Details Patient Name: Steven Meza Date of Service: 04/09/2019 8:00 AM Medical Record Number: [id number] Patient Account Number: [id number] Date of Birth/Sex: 1930/05/13 (85 y.o. M) Treating RN: Cornell Barman Primary Care Kylon Philbrook: Fulton Reek Other Clinician: Referring Benigno Check: Fulton Reek Treating Avanni Turnbaugh/Extender: Melburn Hake, HOYT Weeks in Treatment: 1 Visit Information History Since Last Visit Had a fall or experienced change in No Patient Arrived: Wheel Chair activities of daily living that may affect Arrival Time: 08:58 risk of falls: Accompanied By: self Signs or symptoms of abuse/neglect since last visito No Transfer Assistance: Manual Hospitalized since last visit: No Patient Identification Verified: Yes Implantable device outside of the clinic excluding No Secondary Verification Process Completed: Yes cellular tissue based products placed in the center since last visit: Has Dressing in Place as Prescribed: Yes Pain Present Now: No Electronic Signature(s) Signed: 04/09/2019 2:36:50 PM By: Gretta Cool, BSN, RN, CWS, Kim RN, BSN Entered By: Gretta Cool, BSN, RN, CWS, Kim on 04/09/2019 08:58:57 Steven Meza ([id number]) -------------------------------------------------------------------------------- Compression Therapy Details Patient Name: Steven Meza Date of Service: 04/09/2019 8:00 AM Medical Record Number: [id number] Patient Account Number: [id number] Date of Birth/Sex: 1930/05/12 (85 y.o. M) Treating RN: Cornell Barman Primary Care Janelly Switalski: Fulton Reek Other Clinician: Referring Shamiracle Gorden: Fulton Reek Treating Ayiden Milliman/Extender: Melburn Hake, HOYT Weeks in Treatment: 1 Compression Therapy Performed for Wound Assessment: Wound #6 Left,Circumferential Lower Leg Performed By: Clinician Cornell Barman, RN Compression Type: Three Hydrologist) Signed: 04/09/2019 2:36:50 PM  By: Gretta Cool, BSN, RN, CWS, Kim RN, BSN Entered By: Gretta Cool, BSN, RN, CWS, Kim on 04/09/2019 09:17:07 Steven Meza ([id number]) -------------------------------------------------------------------------------- Encounter Discharge Information Details Patient Name: Steven Meza Date of Service: 04/09/2019 8:00 AM Medical Record Number: [id number] Patient Account Number: [id number] Date of Birth/Sex: Mar 11, 1931 (85 y.o. M) Treating RN: Cornell Barman Primary Care Shalon Salado: Fulton Reek Other Clinician: Referring Cyruss Arata: Fulton Reek Treating Aynsley Fleet/Extender: Melburn Hake, HOYT Weeks in Treatment: 1 Encounter Discharge Information Items Discharge Condition: Stable Ambulatory Status: Wheelchair Discharge Destination: Home Transportation: Other Accompanied By: self Schedule Follow-up Appointment: Yes Clinical Summary of Care: Electronic Signature(s) Signed: 04/09/2019 2:36:50 PM By: Gretta Cool, BSN, RN, CWS, Kim RN, BSN Entered By: Gretta Cool, BSN, RN, CWS, Kim on 04/09/2019 09:18:35 Steven Meza ([id number]) -------------------------------------------------------------------------------- Wound Assessment Details Patient Name: Steven Meza Date of Service: 04/09/2019 8:00 AM Medical Record Number: [id number] Patient Account Number: [id number] Date of Birth/Sex: Sep 18, 1930 (85 y.o. M) Treating RN: Cornell Barman Primary Care Thadd Apuzzo: Fulton Reek Other Clinician: Referring Kordelia Severin: Fulton Reek Treating Nikyah Lackman/Extender: Melburn Hake, HOYT Weeks in Treatment: 1 Wound Status Wound Number: 6 Primary Venous Leg Ulcer Etiology: Wound Location: Left Lower Leg - Circumferential Wound Status: Open Wounding Event: Gradually Appeared Comorbid Lymphedema, Coronary Artery Disease, Date Acquired: 03/02/2019 History: Hypertension Weeks Of Treatment: 1 Clustered Wound: No Wound Measurements Length: (cm) 10 Width: (cm) 20 Depth: (cm) 0.1 Area: (cm) 157.08 Volume: (cm) 15.708 %  Reduction in Area: 43% % Reduction in Volume: 43% Epithelialization: None Tunneling: No Undermining: No Wound Description Classification: Partial Thickness Wound Margin: Indistinct, nonvisible Exudate Amount: Large Exudate Type: Sanguinous Exudate Color: red Foul Odor After Cleansing: No Slough/Fibrino Yes Wound Bed Granulation Amount: Large (67-100%) Exposed Structure Granulation Quality: Red Fascia Exposed: No Necrotic Amount: Small (1-33%) Fat Layer (Subcutaneous Tissue) Exposed: Yes Necrotic Quality: Adherent Slough Tendon Exposed: No Muscle Exposed: No Joint Exposed: No Bone Exposed: No Treatment Notes Wound #6 (Left, Circumferential Lower Leg) Notes adaptic, silvercell, xtrasorb, 3 layer  with unna to Engineer, production) Signed: 04/09/2019 2:36:50 PM By: Gretta Cool, BSN, RN, CWS, Kim RN, BSN Entered By: Gretta Cool, BSN, RN, CWS, Kim on 04/09/2019 09:16:47

## 2019-04-16 ENCOUNTER — Encounter: Payer: Medicare Other | Admitting: Physician Assistant

## 2019-04-16 ENCOUNTER — Other Ambulatory Visit: Payer: Self-pay

## 2019-04-16 DIAGNOSIS — L97822 Non-pressure chronic ulcer of other part of left lower leg with fat layer exposed: Secondary | ICD-10-CM | POA: Diagnosis not present

## 2019-04-16 NOTE — Progress Notes (Addendum)
Steven Meza ([id number]) Visit Report for 04/16/2019 Chief Complaint Document Details Patient Name: Steven Meza Date of Service: 04/16/2019 2:00 PM Medical Record Number: [id number] Patient Account Number: [id number] Date of Birth/Sex: April 04, 1931 (85 y.o. M) Treating RN: Harold Barban Primary Care Provider: Fulton Reek Other Clinician: Referring Provider: Fulton Reek Treating Provider/Extender: Melburn Hake, Linde Wilensky Weeks in Treatment: 2 Information Obtained from: Patient Chief Complaint Left LE Ulcer Electronic Signature(s) Signed: 04/16/2019 2:29:21 PM By: Worthy Keeler PA-C Entered By: Worthy Keeler on 04/16/2019 14:29:20 Steven Meza ([id number]) -------------------------------------------------------------------------------- HPI Details Patient Name: Steven Meza Date of Service: 04/16/2019 2:00 PM Medical Record Number: [id number] Patient Account Number: [id number] Date of Birth/Sex: Feb 02, 1931 (85 y.o. M) Treating RN: Harold Barban Primary Care Provider: Fulton Reek Other Clinician: Referring Provider: Fulton Reek Treating Provider/Extender: Melburn Hake, Aniaya Bacha Weeks in Treatment: 2 History of Present Illness HPI Description: ADMISSION 05/17/2018 Mr. Kok is an 85 year old man who is been treated at the lymphedema clinic for a long period with lymphedema wraps for bilateral lower extremity lymphedema. About 6 months ago they noted small open areas on his left lateral calf just above the ankle. These were open. They are apparently putting some form of ointment on this. They have been referred here for our review of this. The patient has a long history of lymphedema and venous stasis. It says in his records that he has recurrent cellulitis although his wife denies this but she states that the lymphedema may have started with cellulitis several years ago. Also in his record there is a history of bladder cancer which they seem to know very little  about however he did not have any radiation to the pelvis or anything that could have contributed to lymphedema that they are aware of. There is no prior wound history. The patient has 2 small punched out areas on the left lateral calf that have adherent debris on the surface. Past medical history; lymphedema, hypertension, cellulitis, hearing loss, morbid obesity, osteoarthritis, venous stasis, bladder CA, diverticulosis. ABIs in our clinic were 0.36 on the right and 0.57 on the left 05/24/2018; corrected age on this patient is 75 versus what I said last week. He has been for his arterial studies which predictably are not very good. On the right his ABI is 0.65. Monophasic waveforms noted at the right ankle including the posterior tibial and dorsalis pedis. He has triphasic waveforms of the common femoral and profunda femoris triphasic proximal SFA with monophasic distal FSA and popliteal artery. Monophasic tibial waveforms. On the left his ABI is 0.58 monophasic waveforms at the left ankle. Duplex of the left lower extremity demonstrated atherosclerotic change with monophasic waveforms throughout the left lower extremity. There was some concern about left iliac disease and potentially femoral-popliteal and tibial disease. The patient does not describe claudication although according to his wife he over emphasizes his activity. Patient states he is limited by pain in both knees His wounds are on the left lateral calf. 2 small areas with necrotic debris. We have been using silver collagen and light Ace wraps 05/31/2018; wounds on the left lateral calf in the setting of very significant lymphedema and probably PAD. We have been using silver collagen. The base of the small wound looks reasonably improved. I sent him to see Dr. Fletcher Anon however I see now he has an appointment with Dr. Alvester Chou on February 12 2/5; left lateral calf wound looks the same. His wife is doing a good job with her lymphedema wraps  and maintaining that the edema. He most likely has PAD and is due to see Dr. Gwenlyn Found of infectious disease on February 12 2/19; left lateral calf wounds look about the same. This looks like wound secondary to chronic venous insufficiency with lymphedema however the patient has very poor arterial studies. We referred him to Dr. Gwenlyn Found. Dr. Gwenlyn Found did not feel he needed to do anything from an arterial point of view. He did not address the question about the aggressiveness of compression. After some thoughts about this I elected to go ahead and put him in 3 layer compression which after all would be less compression then the lymphedema clinic was putting on this. I am hopeful that this should be enough to get some closure of the small wounds 2/26; left lateral calf wound letter this week. He tolerated the 3 layer compression well furthermore he is sleeping in a hospital bed which helps keep his legs up at night. The wound looks better measuring smaller especially in width 3/3; left lateral calf wound about the same size. The 3 layer compression has really helped get the edema down in his leg however. Using silver collagen 3/11; left lateral calf wound better looking wound surface but about the same size. We have been using 3 layer compression which is really helped get the edema down in the left leg. Even after Dr. Kennon Holter assessment I am reluctant to go to 4 layer compression. He is tolerating 3 layer compression well. We have been using silver collagen Steven Meza, Steven Meza ([id number]) 3/18-Patient returns for his left lateral calf wound which overall is looking better, slightly increased in size and dimensions and area, he is tolerating the 3 layer compression, has been dressed with silver collagen which we will continue 3/25oleft lateral calf wound much better looking. Size is smaller. He has been using silver collagen 4/1; gradually getting smaller in size. Using silver collagen with 3 layer  compression. I think the patient has some degree of PAD. He saw Dr. Gwenlyn Found in consultation, he did not specifically comment on whether he could tolerate 4 layer compression 4/8; the wound is slightly smaller in depth. We are using 3 layer compression with silver collagen. I had him seen for arterial insufficiency however his ABI was 0.57 in the left leg. Dr. Gwenlyn Found really did not seem to middle on the degree of compression he could tolerate 4/15; the wound is slightly smaller and slightly less deep. We have been using 3 layer compression with silver collagen. There is not an option for 4-layer compression here. 4/22; the patient is making decent progress on his wound on the left lateral calf however he arrives in with a superficial wound on the right anterior tibial area. Were not really sure how this happened. He had been using a stocking on the right leg. He has been using silver collagen on the wounds Amazing to me he is he appears to have compression pumps at home. I am not really sure I knew this before. In any case he has not been using them 4/29; left lateral calf wound continues to get gradually smaller. This looks like it is on its way to closure. oThe area on the right anterior tibial area is about the same in terms of size superficial without any depth. This was probably wrap injury oHe tells me that he has been using his compression pumps once a day for 1 hour without any pain 5/6; not as good today. Left lateral calf wound is actually larger  and he does not have quite as good edema control. The area on the right anterior tibia is about the same still superficial. He states he is using his compression pumps once a day. We are using 3 layer compression on the left and Curlex Coban on the right 5/13; the area on the right is very close to being healed. The area on the left still has some depth but has a healthy wound surface. He has excellent edema control bilaterally. Using Hydrofera  Blue 5/20-Patient returns in 1 week after being in 3 layer compression on the left, the left lateral calf ulcer is stable with some slough, the right anterior tibial ulcer is healed we have been using Kerlix Coban on that leg 5/27; Hydrofera Blue and 3 layer compression. Still some slough in the wound area and some nonviable edges around the wound. 6/3; this patient has lymphedema and chronic venous insufficiency. He had difficult to close wound on his left lateral calf. This is closed today. 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 Finally he has known PAD but he is managed to tolerate the compression we reporting on him and also using his compression pumps that he already had at home twice a day 6/10; we discharge this patient last week having finally close the small area in the left lateral calf. Apparently by Friday of last week this had opened and was draining. He is therefore back in clinic. His wife who is present also was concerned about an area on the right anterior tibia."o 6/17; the patient does not have an open area on the right leg. On the left he has a small circular area anteriorly and the same area laterally that he had last time. However both of these appear to be improved he has his [place] wrap 30/40. It turns out that he is only using his external compression pumps once a day. I tried to get him up to twice a day today 6/24; the patient comes in with his external compression garment on the right leg/Farrow wrap. As far as we know no open wounds here. He has 2 areas that are smaller one on the left anterior pretibial and one on the left lateral calf which is the site of his initial wounds. I have successfully caught him into his external compression pumps twice a day 7/1; still using his external compression garment on the right leg. As far as we know there are no open wounds here. The area on the left lateral calf which was the site of his  initial wound has closed over. The area on the left anterior pretibial that we identified last week is still open. 7/8; as far as we know he has no open wounds on the right leg. He has his external compression garment on here. The original wound on the left lateral calf has closed over. He developed an area on the left anterior pretibial 2 weeks ago and seems to have 2 smaller areas on the proximal and distal lateral calf. We do not have his good edema control this week as I am used to seeing 7/15; the patient has still a linear area on the left anterior tibia. The area on the left lateral leg looked somewhat inflamed today but no cellulitis. He did have what looked to be a small pustule that I cultured for CandS although I did not give him empiric antibiotics. 7/22; small area on the left anterior tibia. The area on the  left lateral leg still looks inflamed. The pustule that I cultured last week grew Enterobacter and Pseudomonas. I prescribed Cipro they still have not picked that up. In any case the area actually looks fairly satisfactory. He has decent edema control using his compression pumps twice a day 7/29; the area on the left lateral leg looks a lot better than last week. Nothing is open here. He has an area just medial to the mid anterior tibia that is still open that is only open wound today. 8/5-Patient returns at 1 week for the left leg ulcer, which is actually looking better, he is also complaining of left plantar foot pain below the great toe. He does have a hematoma here Steven Meza, Steven Meza ([id number]) 8/12-Patient returns at 1 week, the left leg open area looks about the same if not slightly better, the left plantar hematoma under the great toe is about the same, patient apparently has not been resting his foot on anything for too long period of time and does not wear the Birkenstock shoes unless he walks outdoors which he does not do often 8/19; small wound on the left anterior  lower leg this is just about closed there is nothing on the left lateral leg. He is using his compression pumps He arrives in clinic today with a black eschar over the right first plantar metatarsal head. This was apparently a bruise that was identified by her nurses last week. He wears crocs on his feet. He also may rub these on a footboard at home 8/26; there is nothing open on the left lateral lower leg he has a small area just medial to the tibia. He is going to see his podiatrist Dr. Milinda Pointer about the area on the left plantar first metatarsal head. He is using his external compression pumps. He has a juxta lite on the right leg 9/2; there is nothing open on either side of her left lower leg. He went to see podiatry about the area on the left first metatarsal head. This was apparently debrided. He was given mupirocin and oral antibiotics. I do not believe he is offloading this at all. Readmission: 04/02/2019 upon evaluation today patient actually appears for reevaluation here in our clinic secondary to issues that he is having with his left lower extremity. He has been tolerating the dressing changes without complication. Fortunately there is no signs of active infection at this time. No fevers, chills, nausea, vomiting, or diarrhea. He has been in the hospital since we last saw him and then subsequently transferred back to [facility]. He tells me that they have been changing his dressing once a day and that he has had a significant amount of pain secondary to undergoing these dressing changes. Fortunately there is no evidence of active infection at this point which is good news. No fevers, chills, nausea, vomiting, or diarrhea. With that being said he wonders if we can go back to doing what we were doing before which was when he actually had the dressing changes performed once a week here in the clinic he tells me that he did very well with that. At that point he tolerated a 3 layer  compression wrap which was approved by vascular, Dr. Tyrell Antonio office, to be used previous although 4-layer compression wrap is probably too much for him. 04/16/2019 on evaluation today patient appears to be doing decently well with regard to his lower extremity although he has had a lot of drainage from his leg. A lot of this  I think is just secondary to the lymphedema with that being said he does have some odor as well which may again just be due to more of the excessive moisture although I think that he may also have something that is a result of bacteria again he has somewhat of a Pseudomonas-like smell to the drainage and fortunately though he is not showing any signs of severe cellulitis he is still experiencing quite a bit of drainage and the discoloration is somewhat of a yellow/green in color which is consistent with Pseudomonas. If he can tolerated I think he may do well with Cipro. Electronic Signature(s) Signed: 04/16/2019 3:22:36 PM By: Worthy Keeler PA-C Entered By: Worthy Keeler on 04/16/2019 15:22:35 Steven Meza ([id number]) -------------------------------------------------------------------------------- Physical Exam Details Patient Name: Steven Meza Date of Service: 04/16/2019 2:00 PM Medical Record Number: [id number] Patient Account Number: [id number] Date of Birth/Sex: 06-30-1930 (85 y.o. M) Treating RN: Harold Barban Primary Care Provider: Fulton Reek Other Clinician: Referring Provider: Fulton Reek Treating Provider/Extender: Melburn Hake, Cindy Brindisi Weeks in Treatment: 2 Constitutional Obese and well-hydrated in no acute distress. Respiratory normal breathing without difficulty. clear to auscultation bilaterally. Cardiovascular regular rate and rhythm with normal S1, S2. Psychiatric this patient is able to make decisions and demonstrates good insight into disease process. Alert and Oriented x 3. pleasant and cooperative. Notes Patient's wound bed  currently showed signs of excessive drainage as far as the lower extremities are concerned there is a lot of edema and a lot of weeping at this point. At this time I am also concerned about the odor as well as the drainage which is somewhat consistent with Pseudomonas to be honest Electronic Signature(s) Signed: 04/16/2019 3:23:10 PM By: Worthy Keeler PA-C Entered By: Worthy Keeler on 04/16/2019 15:23:09 Steven Meza ([id number]) -------------------------------------------------------------------------------- Physician Orders Details Patient Name: Steven Meza Date of Service: 04/16/2019 2:00 PM Medical Record Number: [id number] Patient Account Number: [id number] Date of Birth/Sex: 1930/06/06 (85 y.o. M) Treating RN: Harold Barban Primary Care Provider: Fulton Reek Other Clinician: Referring Provider: Fulton Reek Treating Provider/Extender: Melburn Hake, Laker Thompson Weeks in Treatment: 2 Verbal / Phone Orders: No Diagnosis Coding ICD-10 Coding Meza Description I89.0 Lymphedema, not elsewhere classified I87.2 Venous insufficiency (chronic) (peripheral) L97.822 Non-pressure chronic ulcer of other part of left lower leg with fat layer exposed I10 Essential (primary) hypertension I25.10 Atherosclerotic heart disease of native coronary artery without angina pectoris Wound Cleansing Wound #6 Left,Circumferential Lower Leg o May shower with protection. o No tub bath. Primary Wound Dressing Wound #6 Left,Circumferential Lower Leg o XtraSorb Wound #7 Left Gluteus o Silver Alginate Secondary Dressing Wound #7 Left Gluteus o Boardered Foam Dressing Dressing Change Frequency Wound #6 Left,Circumferential Lower Leg o Change Dressing Monday, Wednesday, Friday - Home Health to change dressings on Wednesdays and Fridays Wound #7 Left Gluteus o Change Dressing Monday, Wednesday, Friday - [place] to change dressings on Wednesdays and Fridays Follow-up  Appointments Wound #6 Left,Circumferential Lower Leg o Return Appointment in 1 week. o Nurse Visit as needed Edema Control Wound #6 Left,Circumferential Lower Leg o 3 Layer Compression System - Left Lower Extremity Off-Loading WEN, GOICOECHEA ([id number]) Wound #6 Left,Circumferential Lower Leg o Turn and reposition every 2 hours Wound #7 Left Gluteus o Turn and reposition every 2 hours Home Health Wound #6 Left,Circumferential Lower Leg o [place] for [place] Nurse may visit PRN to address patientos wound care needs. o FACE  TO FACE ENCOUNTER: MEDICARE and MEDICAID PATIENTS: I certify that this patient is under my care and that I had a face-to-face encounter that meets the physician face-to-face encounter requirements with this patient on this date. The encounter with the patient was in whole or in part for the following MEDICAL CONDITION: (primary reason for [place]) MEDICAL NECESSITY: I certify, that based on my findings, NURSING services are a medically necessary home health service. HOME BOUND STATUS: I certify that my clinical findings support that this patient is homebound (i.e., Due to illness or injury, pt requires aid of supportive devices such as crutches, cane, wheelchairs, walkers, the use of special transportation or the assistance of another person to leave their place of residence. There is a normal inability to leave the home and doing so requires considerable and taxing effort. Other absences are for medical reasons / religious services and are infrequent or of short duration when for other reasons). o If current dressing causes regression in wound condition, may D/C ordered dressing product/s and apply Normal Saline Moist Dressing daily until next [place] / Other MD appointment. [place] of regression in wound condition at [phone]. o  Please direct any NON-WOUND related issues/requests for orders to patient's Primary Care Physician Wound #7 Left [place] for [place] Nurse may visit PRN to address patientos wound care needs. o FACE TO FACE ENCOUNTER: MEDICARE and MEDICAID PATIENTS: I certify that this patient is under my care and that I had a face-to-face encounter that meets the physician face-to-face encounter requirements with this patient on this date. The encounter with the patient was in whole or in part for the following MEDICAL CONDITION: (primary reason for [place]) MEDICAL NECESSITY: I certify, that based on my findings, NURSING services are a medically necessary home health service. HOME BOUND STATUS: I certify that my clinical findings support that this patient is homebound (i.e., Due to illness or injury, pt requires aid of supportive devices such as crutches, cane, wheelchairs, walkers, the use of special transportation or the assistance of another person to leave their place of residence. There is a normal inability to leave the home and doing so requires considerable and taxing effort. Other absences are for medical reasons / religious services and are infrequent or of short duration when for other reasons). o If current dressing causes regression in wound condition, may D/C ordered dressing product/s and apply Normal Saline Moist Dressing daily until next [place] / Other MD appointment. [place] of regression in wound condition at [phone]. o Please direct any NON-WOUND related issues/requests for orders to patient's Primary Care Physician Patient Medications Allergies: No Known Allergies Notifications Medication Indication Start End Cipro 04/16/2019 DOSE 1 - oral 500 mg tablet - 1 tablet oral taken 2 times a day for 10 days Electronic Signature(s) Signed: 04/16/2019 3:25:39 PM  By: Silas Flood ([id number]) Entered By: Worthy Keeler on 04/16/2019 15:25:37 Steven Meza ([id number]) -------------------------------------------------------------------------------- Problem List Details Patient Name: Steven Meza Date of Service: 04/16/2019 2:00 PM Medical Record Number: [id number] Patient Account Number: [id number] Date of Birth/Sex: [birth year] (85 y.o. M) Treating RN: Harold Barban Primary Care Provider: Fulton Reek Other Clinician: Referring Provider: Fulton Reek Treating Provider/Extender: Melburn Hake, Joelene Barriere Weeks in Treatment: 2 Active Problems ICD-10 Evaluated Encounter Meza Description Active Date Today Diagnosis I89.0 Lymphedema, not elsewhere classified 04/02/2019 No Yes I87.2  Venous insufficiency (chronic) (peripheral) 04/02/2019 No Yes L97.822 Non-pressure chronic ulcer of other part of left lower leg with 04/02/2019 No Yes fat layer exposed I10 Essential (primary) hypertension 04/02/2019 No Yes I25.10 Atherosclerotic heart disease of native coronary artery 04/02/2019 No Yes without angina pectoris Inactive Problems Resolved Problems Electronic Signature(s) Signed: 04/16/2019 2:29:10 PM By: Worthy Keeler PA-C Entered By: Worthy Keeler on 04/16/2019 14:29:10 Steven Meza ([id number]) -------------------------------------------------------------------------------- Progress Note Details Patient Name: Steven Meza Date of Service: 04/16/2019 2:00 PM Medical Record Number: [id number] Patient Account Number: [id number] Date of Birth/Sex: 10/15/1930 (85 y.o. M) Treating RN: Harold Barban Primary Care Provider: Fulton Reek Other Clinician: Referring Provider: Fulton Reek Treating Provider/Extender: Melburn Hake, Kema Santaella Weeks in Treatment: 2 Subjective Chief Complaint Information obtained from Patient Left LE Ulcer History of Present Illness (HPI) ADMISSION 05/17/2018 Mr. Choy is an  85 year old man who is been treated at the lymphedema clinic for a long period with lymphedema wraps for bilateral lower extremity lymphedema. About 6 months ago they noted small open areas on his left lateral calf just above the ankle. These were open. They are apparently putting some form of ointment on this. They have been referred here for our review of this. The patient has a long history of lymphedema and venous stasis. It says in his records that he has recurrent cellulitis although his wife denies this but she states that the lymphedema may have started with cellulitis several years ago. Also in his record there is a history of bladder cancer which they seem to know very little about however he did not have any radiation to the pelvis or anything that could have contributed to lymphedema that they are aware of. There is no prior wound history. The patient has 2 small punched out areas on the left lateral calf that have adherent debris on the surface. Past medical history; lymphedema, hypertension, cellulitis, hearing loss, morbid obesity, osteoarthritis, venous stasis, bladder CA, diverticulosis. ABIs in our clinic were 0.36 on the right and 0.57 on the left 05/24/2018; corrected age on this patient is 43 versus what I said last week. He has been for his arterial studies which predictably are not very good. On the right his ABI is 0.65. Monophasic waveforms noted at the right ankle including the posterior tibial and dorsalis pedis. He has triphasic waveforms of the common femoral and profunda femoris triphasic proximal SFA with monophasic distal FSA and popliteal artery. Monophasic tibial waveforms. On the left his ABI is 0.58 monophasic waveforms at the left ankle. Duplex of the left lower extremity demonstrated atherosclerotic change with monophasic waveforms throughout the left lower extremity. There was some concern about left iliac disease and potentially femoral-popliteal and tibial  disease. The patient does not describe claudication although according to his wife he over emphasizes his activity. Patient states he is limited by pain in both knees His wounds are on the left lateral calf. 2 small areas with necrotic debris. We have been using silver collagen and light Ace wraps 05/31/2018; wounds on the left lateral calf in the setting of very significant lymphedema and probably PAD. We have been using silver collagen. The base of the small wound looks reasonably improved. I sent him to see Dr. Fletcher Anon however I see now he has an appointment with Dr. Alvester Chou on February 12 2/5; left lateral calf wound looks the same. His wife is doing a good job with her lymphedema wraps and maintaining that the edema. He most likely has PAD and  is due to see Dr. Gwenlyn Found of infectious disease on February 12 2/19; left lateral calf wounds look about the same. This looks like wound secondary to chronic venous insufficiency with lymphedema however the patient has very poor arterial studies. We referred him to Dr. Gwenlyn Found. Dr. Gwenlyn Found did not feel he needed to do anything from an arterial point of view. He did not address the question about the aggressiveness of compression. After some thoughts about this I elected to go ahead and put him in 3 layer compression which after all would be less compression then the lymphedema clinic was putting on this. I am hopeful that this should be enough to get some closure of the small wounds 2/26; left lateral calf wound letter this week. He tolerated the 3 layer compression well furthermore he is sleeping in a hospital Steven Meza, Steven A. ([id number]) bed which helps keep his legs up at night. The wound looks better measuring smaller especially in width 3/3; left lateral calf wound about the same size. The 3 layer compression has really helped get the edema down in his leg however. Using silver collagen 3/11; left lateral calf wound better looking wound surface but about  the same size. We have been using 3 layer compression which is really helped get the edema down in the left leg. Even after Dr. Kennon Holter assessment I am reluctant to go to 4 layer compression. He is tolerating 3 layer compression well. We have been using silver collagen 3/18-Patient returns for his left lateral calf wound which overall is looking better, slightly increased in size and dimensions and area, he is tolerating the 3 layer compression, has been dressed with silver collagen which we will continue 3/25 left lateral calf wound much better looking. Size is smaller. He has been using silver collagen 4/1; gradually getting smaller in size. Using silver collagen with 3 layer compression. I think the patient has some degree of PAD. He saw Dr. Gwenlyn Found in consultation, he did not specifically comment on whether he could tolerate 4 layer compression 4/8; the wound is slightly smaller in depth. We are using 3 layer compression with silver collagen. I had him seen for arterial insufficiency however his ABI was 0.57 in the left leg. Dr. Gwenlyn Found really did not seem to middle on the degree of compression he could tolerate 4/15; the wound is slightly smaller and slightly less deep. We have been using 3 layer compression with silver collagen. There is not an option for 4-layer compression here. 4/22; the patient is making decent progress on his wound on the left lateral calf however he arrives in with a superficial wound on the right anterior tibial area. Were not really sure how this happened. He had been using a stocking on the right leg. He has been using silver collagen on the wounds Amazing to me he is he appears to have compression pumps at home. I am not really sure I knew this before. In any case he has not been using them 4/29; left lateral calf wound continues to get gradually smaller. This looks like it is on its way to closure. The area on the right anterior tibial area is about the same in terms  of size superficial without any depth. This was probably wrap injury He tells me that he has been using his compression pumps once a day for 1 hour without any pain 5/6; not as good today. Left lateral calf wound is actually larger and he does not have quite as good edema  control. The area on the right anterior tibia is about the same still superficial. He states he is using his compression pumps once a day. We are using 3 layer compression on the left and Curlex Coban on the right 5/13; the area on the right is very close to being healed. The area on the left still has some depth but has a healthy wound surface. He has excellent edema control bilaterally. Using Hydrofera Blue 5/20-Patient returns in 1 week after being in 3 layer compression on the left, the left lateral calf ulcer is stable with some slough, the right anterior tibial ulcer is healed we have been using Kerlix Coban on that leg 5/27; Hydrofera Blue and 3 layer compression. Still some slough in the wound area and some nonviable edges around the wound. 6/3; this patient has lymphedema and chronic venous insufficiency. He had difficult to close wound on his left lateral calf. This is closed today. 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 Finally he has known PAD but he is managed to tolerate the compression we reporting on him and also using his compression pumps that he already had at home twice a day 6/10; we discharge this patient last week having finally close the small area in the left lateral calf. Apparently by Friday of last week this had opened and was draining. He is therefore back in clinic. His wife who is present also was concerned about an area on the right anterior tibia."o 6/17; the patient does not have an open area on the right leg. On the left he has a small circular area anteriorly and the same area laterally that he had last time. However both of these appear to be improved  he has his [place] wrap 30/40. It turns out that he is only using his external compression pumps once a day. I tried to get him up to twice a day today 6/24; the patient comes in with his external compression garment on the right leg/Farrow wrap. As far as we know no open wounds here. He has 2 areas that are smaller one on the left anterior pretibial and one on the left lateral calf which is the site of his initial wounds. I have successfully caught him into his external compression pumps twice a day 7/1; still using his external compression garment on the right leg. As far as we know there are no open wounds here. The area on the left lateral calf which was the site of his initial wound has closed over. The area on the left anterior pretibial that we identified last week is still open. 7/8; as far as we know he has no open wounds on the right leg. He has his external compression garment on here. The original wound on the left lateral calf has closed over. He developed an area on the left anterior pretibial 2 weeks ago and seems to have 2 smaller areas on the proximal and distal lateral calf. We do not have his good edema control this week as I am used to seeing 7/15; the patient has still a linear area on the left anterior tibia. The area on the left lateral leg looked somewhat inflamed today but no cellulitis. He did have what looked to be a small pustule that I cultured for CandS although I did not give him empiric antibiotics. 7/22; small area on the left anterior tibia. The area on the left lateral leg still looks inflamed. The pustule that I  cultured last Steven Meza, Steven Meza ([id number]) week grew Enterobacter and Pseudomonas. I prescribed Cipro they still have not picked that up. In any case the area actually looks fairly satisfactory. He has decent edema control using his compression pumps twice a day 7/29; the area on the left lateral leg looks a lot better than last week. Nothing is open  here. He has an area just medial to the mid anterior tibia that is still open that is only open wound today. 8/5-Patient returns at 1 week for the left leg ulcer, which is actually looking better, he is also complaining of left plantar foot pain below the great toe. He does have a hematoma here 8/12-Patient returns at 1 week, the left leg open area looks about the same if not slightly better, the left plantar hematoma under the great toe is about the same, patient apparently has not been resting his foot on anything for too long period of time and does not wear the Birkenstock shoes unless he walks outdoors which he does not do often 8/19; small wound on the left anterior lower leg this is just about closed there is nothing on the left lateral leg. He is using his compression pumps He arrives in clinic today with a black eschar over the right first plantar metatarsal head. This was apparently a bruise that was identified by her nurses last week. He wears crocs on his feet. He also may rub these on a footboard at home 8/26; there is nothing open on the left lateral lower leg he has a small area just medial to the tibia. He is going to see his podiatrist Dr. Milinda Pointer about the area on the left plantar first metatarsal head. He is using his external compression pumps. He has a juxta lite on the right leg 9/2; there is nothing open on either side of her left lower leg. He went to see podiatry about the area on the left first metatarsal head. This was apparently debrided. He was given mupirocin and oral antibiotics. I do not believe he is offloading this at all. Readmission: 04/02/2019 upon evaluation today patient actually appears for reevaluation here in our clinic secondary to issues that he is having with his left lower extremity. He has been tolerating the dressing changes without complication. Fortunately there is no signs of active infection at this time. No fevers, chills, nausea, vomiting, or  diarrhea. He has been in the hospital since we last saw him and then subsequently transferred back to [facility]. He tells me that they have been changing his dressing once a day and that he has had a significant amount of pain secondary to undergoing these dressing changes. Fortunately there is no evidence of active infection at this point which is good news. No fevers, chills, nausea, vomiting, or diarrhea. With that being said he wonders if we can go back to doing what we were doing before which was when he actually had the dressing changes performed once a week here in the clinic he tells me that he did very well with that. At that point he tolerated a 3 layer compression wrap which was approved by vascular, Dr. Tyrell Antonio office, to be used previous although 4-layer compression wrap is probably too much for him. 04/16/2019 on evaluation today patient appears to be doing decently well with regard to his lower extremity although he has had a lot of drainage from his leg. A lot of this I think is just secondary to the lymphedema with that  being said he does have some odor as well which may again just be due to more of the excessive moisture although I think that he may also have something that is a result of bacteria again he has somewhat of a Pseudomonas-like smell to the drainage and fortunately though he is not showing any signs of severe cellulitis he is still experiencing quite a bit of drainage and the discoloration is somewhat of a yellow/green in color which is consistent with Pseudomonas. If he can tolerated I think he may do well with Cipro. Patient History Information obtained from Patient. Family History Cancer - Mother, Diabetes - Siblings, Heart Disease - Mother, No family history of Hereditary Spherocytosis, Hypertension, Kidney Disease, Lung Disease, Seizures, Stroke, Thyroid Problems, Tuberculosis. Social History Former smoker, Alcohol Use - Moderate, Drug Use - No History,  Caffeine Use - Daily. Medical History Eyes Denies history of Cataracts, Glaucoma, Optic Neuritis Ear/Nose/Mouth/Throat Denies history of Chronic sinus problems/congestion, Middle ear problems Hematologic/Lymphatic Patient has history of Lymphedema DYMIR, GHANNAM ([id number]) Denies history of Anemia, Hemophilia, Human Immunodeficiency Virus, Sickle Cell Disease Respiratory Denies history of Aspiration, Asthma, Chronic Obstructive Pulmonary Disease (COPD), Pneumothorax, Sleep Apnea, Tuberculosis Cardiovascular Patient has history of Coronary Artery Disease, Hypertension Denies history of Angina, Arrhythmia, Congestive Heart Failure, Deep Vein Thrombosis, Hypotension, Myocardial Infarction, Peripheral Arterial Disease, Peripheral Venous Disease, Phlebitis, Vasculitis Gastrointestinal Denies history of Cirrhosis , Colitis, Crohn s, Hepatitis A, Hepatitis B, Hepatitis C Endocrine Denies history of Type I Diabetes, Type II Diabetes Genitourinary Denies history of End Stage Renal Disease Immunological Denies history of Lupus Erythematosus, Raynaud s, Scleroderma Integumentary (Skin) Denies history of History of Burn, History of pressure wounds Musculoskeletal Denies history of Gout, Rheumatoid Arthritis, Osteoarthritis, Osteomyelitis Neurologic Denies history of Dementia, Neuropathy, Quadriplegia, Paraplegia, Seizure Disorder Oncologic Denies history of Received Chemotherapy, Received Radiation Medical And Surgical History Notes Ear/Nose/Mouth/Throat hoh Review of Systems (ROS) Constitutional Symptoms (General Health) Denies complaints or symptoms of Fatigue, Fever, Chills, Marked Weight Change. Respiratory Denies complaints or symptoms of Chronic or frequent coughs, Shortness of Breath. Cardiovascular Complains or has symptoms of LE edema. Denies complaints or symptoms of Chest pain. Psychiatric Denies complaints or symptoms of Anxiety,  Claustrophobia. Objective Constitutional Obese and well-hydrated in no acute distress. Vitals Time Taken: 2:12 PM, Height: 65 in, Weight: 273 lbs, BMI: 45.4, Temperature: 98.9 F, Pulse: 95 bpm, Respiratory Rate: 18 breaths/min, Blood Pressure: 139/60 mmHg. Respiratory normal breathing without difficulty. clear to auscultation bilaterally. Steven Meza, Steven Meza ([id number]) Cardiovascular regular rate and rhythm with normal S1, S2. Psychiatric this patient is able to make decisions and demonstrates good insight into disease process. Alert and Oriented x 3. pleasant and cooperative. General Notes: Patient's wound bed currently showed signs of excessive drainage as far as the lower extremities are concerned there is a lot of edema and a lot of weeping at this point. At this time I am also concerned about the odor as well as the drainage which is somewhat consistent with Pseudomonas to be honest Integumentary (Hair, Skin) Wound #6 status is Open. Original cause of wound was Gradually Appeared. The wound is located on the Left,Circumferential Lower Leg. The wound measures 13cm length x 29cm width x 0.1cm depth; 296.095cm^2 area and 29.61cm^3 volume. There is Fat Layer (Subcutaneous Tissue) Exposed exposed. There is no undermining noted. There is a large amount of sanguinous drainage noted. The wound margin is indistinct and nonvisible. There is large (67-100%) red granulation within the wound bed. There is a small (1-33%)  amount of necrotic tissue within the wound bed including Adherent Slough. Wound #7 status is Open. Original cause of wound was Pressure Injury. The wound is located on the Left Gluteus. The wound measures 0.5cm length x 1cm width x 0.1cm depth; 0.393cm^2 area and 0.039cm^3 volume. There is Fat Layer (Subcutaneous Tissue) Exposed exposed. There is no tunneling or undermining noted. There is a medium amount of serous drainage noted. The wound margin is indistinct and nonvisible.  There is small (1-33%) red granulation within the wound bed. There is a large (67-100%) amount of necrotic tissue within the wound bed. General Notes: dry scaly skin Assessment Active Problems ICD-10 Lymphedema, not elsewhere classified Venous insufficiency (chronic) (peripheral) Non-pressure chronic ulcer of other part of left lower leg with fat layer exposed Essential (primary) hypertension Atherosclerotic heart disease of native coronary artery without angina pectoris Plan Wound Cleansing: Wound #6 Left,Circumferential Lower Leg: May shower with protection. No tub bath. Primary Wound Dressing: Wound #6 Left,Circumferential Lower Leg: XtraSorb Wound #7 Left Gluteus: Silver Alginate Secondary Dressing: DANZEL, CISSELL ([id number]) Wound #7 Left Gluteus: Boardered Foam Dressing Dressing Change Frequency: Wound #6 Left,Circumferential Lower Leg: Change Dressing Monday, Wednesday, Friday - [place] to change dressings on Wednesdays and Fridays Wound #7 Left Gluteus: Change Dressing Monday, Wednesday, Friday - [place] to change dressings on Wednesdays and Fridays Follow-up Appointments: Wound #6 Left,Circumferential Lower Leg: Return Appointment in 1 week. Nurse Visit as needed Edema Control: Wound #6 Left,Circumferential Lower Leg: 3 Layer Compression System - Left Lower Extremity Off-Loading: Wound #6 Left,Circumferential Lower Leg: Turn and reposition every 2 hours Wound #7 Left Gluteus: Turn and reposition every 2 hours Home Health: Wound #6 Left,Circumferential Lower Leg: [place] for [place] Nurse may visit PRN to address patient s wound care needs. FACE TO FACE ENCOUNTER: MEDICARE and MEDICAID PATIENTS: I certify that this patient is under my care and that I had a face-to-face encounter that meets the physician face-to-face encounter requirements with this patient on this date. The encounter with  the patient was in whole or in part for the following MEDICAL CONDITION: (primary reason for [place]) MEDICAL NECESSITY: I certify, that based on my findings, NURSING services are a medically necessary home health service. HOME BOUND STATUS: I certify that my clinical findings support that this patient is homebound (i.e., Due to illness or injury, pt requires aid of supportive devices such as crutches, cane, wheelchairs, walkers, the use of special transportation or the assistance of another person to leave their place of residence. There is a normal inability to leave the home and doing so requires considerable and taxing effort. Other absences are for medical reasons / religious services and are infrequent or of short duration when for other reasons). If current dressing causes regression in wound condition, may D/C ordered dressing product/s and apply Normal Saline Moist Dressing daily until next [place] / Other MD appointment. [place] of regression in wound condition at [phone]. Please direct any NON-WOUND related issues/requests for orders to patient's Primary Care Physician Wound #7 Left Gluteus: [place] for [place] Nurse may visit PRN to address patient s wound care needs. FACE TO FACE ENCOUNTER: MEDICARE and MEDICAID PATIENTS: I certify that this patient is under my care and that I had a face-to-face encounter that meets the physician face-to-face encounter requirements with this patient on this date. The encounter with the patient  was in whole or in part for the following MEDICAL CONDITION: (primary reason for [place]) MEDICAL NECESSITY: I certify, that based on my findings, NURSING services are a medically necessary home health service. HOME BOUND STATUS: I certify that my clinical findings support that this patient is homebound (i.e., Due to illness or injury, pt requires  aid of supportive devices such as crutches, cane, wheelchairs, walkers, the use of special transportation or the assistance of another person to leave their place of residence. There is a normal inability to leave the home and doing so requires considerable and taxing effort. Other absences are for medical reasons / religious services and are infrequent or of short duration when for other reasons). If current dressing causes regression in wound condition, may D/C ordered dressing product/s and apply Normal Saline Moist Dressing daily until next [place] / Other MD appointment. [place] of regression in wound condition at [phone]. Please direct any NON-WOUND related issues/requests for orders to patient's Primary Care Physician The following medication(s) was prescribed: Cipro oral 500 mg tablet 1 1 tablet oral taken 2 times a day for 10 days starting 04/16/2019 Steven Meza, Steven A. ([id number]) 1. I would recommend currently that we go ahead and initiate treatment with XtraSorb to be used over the lower extremities bilaterally which hopefully will help to absorb the drainage without trapping fluid. Right now he is in a contact layer followed by silver alginate which I think is actually causing more trouble for him than good. 2. I am in a suggest as well that we go ahead and continue with elevation is much as possible to help with edema control in regard to his lower extremities which again I think is important as far as healing is concerned. 3. With regard to the gluteal area I think the patient does need to use alginate followed by border foam dressing he needs to be very careful with sitting for long periods of time. Appropriate offloading is of utmost importance. 4. Cipro 500mg  prescribed for patient. We will see patient back for reevaluation in 1 week here in the clinic. If anything worsens or changes patient will contact our office for additional  recommendations. Electronic Signature(s) Signed: 04/16/2019 3:26:08 PM By: Worthy Keeler PA-C Previous Signature: 04/16/2019 3:24:08 PM Version By: Worthy Keeler PA-C Entered By: Worthy Keeler on 04/16/2019 15:26:08 Steven Meza ([id number]) -------------------------------------------------------------------------------- ROS/PFSH Details Patient Name: Steven Meza Date of Service: 04/16/2019 2:00 PM Medical Record Number: [id number] Patient Account Number: [id number] Date of Birth/Sex: 07/14/1930 (85 y.o. M) Treating RN: Harold Barban Primary Care Provider: Fulton Reek Other Clinician: Referring Provider: Fulton Reek Treating Provider/Extender: Melburn Hake, Yusuf Yu Weeks in Treatment: 2 Information Obtained From Patient Constitutional Symptoms (General Health) Complaints and Symptoms: Negative for: Fatigue; Fever; Chills; Marked Weight Change Respiratory Complaints and Symptoms: Negative for: Chronic or frequent coughs; Shortness of Breath Medical History: Negative for: Aspiration; Asthma; Chronic Obstructive Pulmonary Disease (COPD); Pneumothorax; Sleep Apnea; Tuberculosis Cardiovascular Complaints and Symptoms: Positive for: LE edema Negative for: Chest pain Medical History: Positive for: Coronary Artery Disease; Hypertension Negative for: Angina; Arrhythmia; Congestive Heart Failure; Deep Vein Thrombosis; Hypotension; Myocardial Infarction; Peripheral Arterial Disease; Peripheral Venous Disease; Phlebitis; Vasculitis Psychiatric Complaints and Symptoms: Negative for: Anxiety; Claustrophobia Eyes Medical History: Negative for: Cataracts; Glaucoma; Optic Neuritis Ear/Nose/Mouth/Throat Medical History: Negative for: Chronic sinus problems/congestion; Middle ear problems Past Medical History Notes: hoh Hematologic/Lymphatic Medical History: Positive for: Lymphedema Negative for: Anemia; Hemophilia; Human Immunodeficiency Virus; Sickle Cell  Disease HARVE, MCGRUDER ([id number]) Gastrointestinal Medical History: Negative for: Cirrhosis ; Colitis; Crohnos; Hepatitis A; Hepatitis B; Hepatitis C Endocrine Medical History: Negative for: Type I Diabetes; Type II Diabetes Genitourinary Medical History: Negative for: End Stage Renal Disease Immunological Medical History: Negative for: Lupus Erythematosus; Raynaudos; Scleroderma Integumentary (Skin) Medical History: Negative for: History of Burn; History of pressure wounds Musculoskeletal Medical History: Negative for: Gout; Rheumatoid Arthritis; Osteoarthritis; Osteomyelitis Neurologic Medical History: Negative for: Dementia; Neuropathy; Quadriplegia; Paraplegia; Seizure Disorder Oncologic Medical History: Negative for: Received Chemotherapy; Received Radiation Immunizations Pneumococcal Vaccine: Received Pneumococcal Vaccination: Yes Tetanus Vaccine: Last tetanus shot: 01/02/2018 Implantable Devices None Family and Social History Cancer: Yes - Mother; Diabetes: Yes - Siblings; Heart Disease: Yes - Mother; Hereditary Spherocytosis: No; Hypertension: No; Kidney Disease: No; Lung Disease: No; Seizures: No; Stroke: No; Thyroid Problems: No; Tuberculosis: No; Former smoker; Alcohol Use: Moderate; Drug Use: No History; Caffeine Use: Daily; Financial Concerns: No; Food, Clothing or Shelter Needs: No; Support System Lacking: No; Transportation Concerns: No Physician Affirmation I have reviewed and agree with the above information. Steven Meza, Steven Meza ([id number]) Electronic Signature(s) Signed: 04/16/2019 4:05:06 PM By: Harold Barban Signed: 04/16/2019 5:30:09 PM By: Worthy Keeler PA-C Entered By: Worthy Keeler on 04/16/2019 15:22:50 Steven Meza ([id number]) -------------------------------------------------------------------------------- SuperBill Details Patient Name: Steven Meza Date of Service: 04/16/2019 Medical Record Number: [id number] Patient  Account Number: [id number] Date of Birth/Sex: [birth year] (85 y.o. M) Treating RN: Harold Barban Primary Care Provider: Fulton Reek Other Clinician: Referring Provider: Fulton Reek Treating Provider/Extender: Melburn Hake, Matalie Romberger Weeks in Treatment: 2 Diagnosis Coding ICD-10 Codes Meza Description I89.0 Lymphedema, not elsewhere classified I87.2 Venous insufficiency (chronic) (peripheral) L97.822 Non-pressure chronic ulcer of other part of left lower leg with fat layer exposed I10 Essential (primary) hypertension I25.10 Atherosclerotic heart disease of native coronary artery without angina pectoris Facility Procedures CPT4 Meza: [id number] Description: 99213 - WOUND CARE VISIT-LEV 3 EST PT Modifier: Quantity: 1 Physician Procedures CPT4 Meza Description: [id number] - WC PHYS LEVEL 4 - EST PT ICD-10 Diagnosis Description I89.0 Lymphedema, not elsewhere classified I87.2 Venous insufficiency (chronic) (peripheral) L97.822 Non-pressure chronic ulcer of other part of left lower leg  wit I10 Essential (primary) hypertension Modifier: h fat layer expos Quantity: 1 ed Electronic Signature(s) Signed: 04/16/2019 3:26:31 PM By: Worthy Keeler PA-C Entered By: Worthy Keeler on 04/16/2019 15:26:30

## 2019-04-19 NOTE — Progress Notes (Signed)
Steven Meza, Steven Meza ([id number]) Visit Report for 04/16/2019 Arrival Information Details Patient Name: Steven Meza, Steven Meza Date of Service: 04/16/2019 2:00 PM Medical Record Number: [id number] Patient Account Number: [id number] Date of Birth/Sex: Jun 20, 1930 (85 y.o. M) Treating RN: Cornell Barman Primary Care Seraphim Affinito: Fulton Reek Other Clinician: Referring Amberli Ruegg: Fulton Reek Treating Taren Toops/Extender: Melburn Hake, HOYT Weeks in Treatment: 2 Visit Information History Since Last Visit Added or deleted any medications: No Patient Arrived: Walker Any new allergies or adverse reactions: No Arrival Time: 14:11 Had a fall or experienced change in No Accompanied By: wife activities of daily living that may affect Transfer Assistance: None risk of falls: Patient Identification Verified: Yes Signs or symptoms of abuse/neglect since last visito No Secondary Verification Process Completed: Yes Hospitalized since last visit: No Implantable device outside of the clinic excluding No cellular tissue based products placed in the center since last visit: Pain Present Now: No Electronic Signature(s) Signed: 04/19/2019 11:42:17 AM By: Gretta Cool, BSN, RN, CWS, Kim RN, BSN Entered By: Gretta Cool, BSN, RN, CWS, Kim on 04/16/2019 14:11:56 Steven Meza ([id number]) -------------------------------------------------------------------------------- Clinic Level of Care Assessment Details Patient Name: Steven Meza Date of Service: 04/16/2019 2:00 PM Medical Record Number: [id number] Patient Account Number: [id number] Date of Birth/Sex: [birth year] (85 y.o. M) Treating RN: Harold Barban Primary Care Garvey Westcott: Fulton Reek Other Clinician: Referring Liliauna Santoni: Fulton Reek Treating Rhyse Loux/Extender: Melburn Hake, HOYT Weeks in Treatment: 2 Clinic Level of Care Assessment Items TOOL 4 Quantity Score []  - Use when only an EandM is performed on FOLLOW-UP visit 0 ASSESSMENTS - Nursing Assessment /  Reassessment X - Reassessment of Co-morbidities (includes updates in patient status) 1 10 X- 1 5 Reassessment of Adherence to Treatment Plan ASSESSMENTS - Wound and Skin Assessment / Reassessment []  - Simple Wound Assessment / Reassessment - one wound 0 X- 2 5 Complex Wound Assessment / Reassessment - multiple wounds []  - 0 Dermatologic / Skin Assessment (not related to wound area) ASSESSMENTS - Focused Assessment []  - Circumferential Edema Measurements - multi extremities 0 []  - 0 Nutritional Assessment / Counseling / Intervention []  - 0 Lower Extremity Assessment (monofilament, tuning fork, pulses) []  - 0 Peripheral Arterial Disease Assessment (using hand held doppler) ASSESSMENTS - Ostomy and/or Continence Assessment and Care []  - Incontinence Assessment and Management 0 []  - 0 Ostomy Care Assessment and Management (repouching, etc.) PROCESS - Coordination of Care X - Simple Patient / Family Education for ongoing care 1 15 []  - 0 Complex (extensive) Patient / Family Education for ongoing care []  - 0 Staff obtains Programmer, systems, Records, Test Results / Process Orders X- 1 10 Staff telephones HHA, Nursing Homes / Clarify orders / etc []  - 0 Routine Transfer to another Facility (non-emergent condition) []  - 0 Routine Hospital Admission (non-emergent condition) []  - 0 New Admissions / Biomedical engineer / Ordering NPWT, Apligraf, etc. []  - 0 Emergency Hospital Admission (emergent condition) X- 1 10 Simple Discharge Coordination Steven Meza, Steven Meza ([id number]) []  - 0 Complex (extensive) Discharge Coordination PROCESS - Special Needs []  - Pediatric / Minor Patient Management 0 []  - 0 Isolation Patient Management []  - 0 Hearing / Language / Visual special needs []  - 0 Assessment of Community assistance (transportation, D/C planning, etc.) []  - 0 Additional assistance / Altered mentation []  - 0 Support Surface(s) Assessment (bed, cushion, seat, etc.) INTERVENTIONS -  Wound Cleansing / Measurement []  - Simple Wound Cleansing - one wound 0 X- 2 5 Complex Wound Cleansing - multiple wounds X- 1 5 Wound Imaging (photographs -  any number of wounds) []  - 0 Wound Tracing (instead of photographs) []  - 0 Simple Wound Measurement - one wound X- 2 5 Complex Wound Measurement - multiple wounds INTERVENTIONS - Wound Dressings X - Small Wound Dressing one or multiple wounds 2 10 []  - 0 Medium Wound Dressing one or multiple wounds []  - 0 Large Wound Dressing one or multiple wounds []  - 0 Application of Medications - topical []  - 0 Application of Medications - injection INTERVENTIONS - Miscellaneous []  - External ear exam 0 []  - 0 Specimen Collection (cultures, biopsies, blood, body fluids, etc.) []  - 0 Specimen(s) / Culture(s) sent or taken to Lab for analysis []  - 0 Patient Transfer (multiple staff / Civil Service fast streamer / Similar devices) []  - 0 Simple Staple / Suture removal (25 or less) []  - 0 Complex Staple / Suture removal (26 or more) []  - 0 Hypo / Hyperglycemic Management (close monitor of Blood Glucose) []  - 0 Ankle / Brachial Index (ABI) - do not check if billed separately X- 1 5 Vital Signs Steven Meza, Steven Meza ([id number]) Has the patient been seen at the hospital within the last three years: Yes Total Score: 110 Level Of Care: New/Established - Level 3 Electronic Signature(s) Signed: 04/16/2019 4:05:06 PM By: Harold Barban Entered By: Harold Barban on 04/16/2019 15:10:41 Steven Meza ([id number]) -------------------------------------------------------------------------------- Encounter Discharge Information Details Patient Name: Steven Meza Date of Service: 04/16/2019 2:00 PM Medical Record Number: [id number] Patient Account Number: [id number] Date of Birth/Sex: 1930/06/01 (85 y.o. M) Treating RN: Harold Barban Primary Care Jacquelin Krajewski: Fulton Reek Other Clinician: Referring Levert Heslop: Fulton Reek Treating  Earlene Bjelland/Extender: Melburn Hake, HOYT Weeks in Treatment: 2 Encounter Discharge Information Items Discharge Condition: Stable Ambulatory Status: Ambulatory Discharge Destination: Home Transportation: Private Auto Accompanied By: wife Schedule Follow-up Appointment: Yes Clinical Summary of Care: Electronic Signature(s) Signed: 04/16/2019 4:05:06 PM By: Harold Barban Entered By: Harold Barban on 04/16/2019 15:19:57 Steven Meza ([id number]) -------------------------------------------------------------------------------- Lower Extremity Assessment Details Patient Name: Steven Meza Date of Service: 04/16/2019 2:00 PM Medical Record Number: [id number] Patient Account Number: [id number] Date of Birth/Sex: 13-Sep-1930 (85 y.o. M) Treating RN: Cornell Barman Primary Care Nabeel Gladson: Fulton Reek Other Clinician: Referring Verdine Grenfell: Fulton Reek Treating Leanor Voris/Extender: Melburn Hake, HOYT Weeks in Treatment: 2 Edema Assessment Assessed: [Left: No] [Right: No] [Left: Edema] [Right: :] Calf Left: Right: Point of Measurement: 33 cm From Medial Instep 45 cm cm Ankle Left: Right: Point of Measurement: 12 cm From Medial Instep 28.5 cm cm Vascular Assessment Pulses: Dorsalis Pedis Palpable: [Left:Yes] Electronic Signature(s) Signed: 04/19/2019 11:42:17 AM By: Gretta Cool, BSN, RN, CWS, Kim RN, BSN Entered By: Gretta Cool, BSN, RN, CWS, Kim on 04/16/2019 14:20:09 Steven Meza ([id number]) -------------------------------------------------------------------------------- Multi Wound Chart Details Patient Name: Steven Meza Date of Service: 04/16/2019 2:00 PM Medical Record Number: [id number] Patient Account Number: [id number] Date of Birth/Sex: [birth year] (85 y.o. M) Treating RN: Harold Barban Primary Care Sol Odor: Fulton Reek Other Clinician: Referring Emmanuel Ercole: Fulton Reek Treating Duan Scharnhorst/Extender: Melburn Hake, HOYT Weeks in Treatment: 2 Vital Signs Height(in):  65 Pulse(bpm): 95 Weight(lbs): 273 Blood Pressure(mmHg): 139/60 Body Mass Index(BMI): 45 Temperature(F): 98.9 Respiratory Rate 18 (breaths/min): Photos: [N/A:N/A] Wound Location: Left Lower Leg - Left Gluteus N/A Circumferential Wounding Event: Gradually Appeared Pressure Injury N/A Primary Etiology: Venous Leg Ulcer Pressure Ulcer N/A Comorbid History: Lymphedema, Coronary Artery Lymphedema, Coronary Artery N/A Disease, Hypertension Disease, Hypertension Date Acquired: 03/02/2019 04/02/2019 N/A Weeks of Treatment: 2 0 N/A Wound Status: Open Open N/A Measurements L x W x D  13x29x0.1 0.5x1x0.1 N/A (cm) Area (cm) : 296.095 0.393 N/A Volume (cm) : 29.61 0.039 N/A % Reduction in Area: -7.40% 0.00% N/A % Reduction in Volume: -7.40% 0.00% N/A Classification: Partial Thickness Category/Stage II N/A Exudate Amount: Large Medium N/A Exudate Type: Sanguinous Serous N/A Exudate Color: red amber N/A Wound Margin: Indistinct, nonvisible Indistinct, nonvisible N/A Steven Meza, Steven Meza ([id number]) Granulation Amount: Large (67-100%) Small (1-33%) N/A Granulation Quality: Red Red N/A Necrotic Amount: Small (1-33%) Large (67-100%) N/A Exposed Structures: Fat Layer (Subcutaneous Fat Layer (Subcutaneous N/A Tissue) Exposed: Yes Tissue) Exposed: Yes Fascia: No Fascia: No Tendon: No Tendon: No Muscle: No Muscle: No Joint: No Joint: No Bone: No Bone: No Epithelialization: Small (1-33%) None N/A Assessment Notes: N/A dry scaly skin N/A Treatment Notes Electronic Signature(s) Signed: 04/16/2019 4:05:06 PM By: Harold Barban Entered By: Harold Barban on 04/16/2019 15:00:19 Steven Meza ([id number]) -------------------------------------------------------------------------------- [place] Details Patient Name: Steven Meza Date of Service: 04/16/2019 2:00 PM Medical Record Number: [id number] Patient Account Number: [id number] Date of Birth/Sex: 28-Jul-1930  (85 y.o. M) Treating RN: Harold Barban Primary Care Lamond Glantz: Fulton Reek Other Clinician: Referring Joci Dress: Fulton Reek Treating Saia Derossett/Extender: Melburn Hake, HOYT Weeks in Treatment: 2 Active Inactive Electronic Signature(s) Signed: 04/16/2019 4:05:06 PM By: Harold Barban Entered By: Harold Barban on 04/16/2019 15:00:10 Steven Meza ([id number]) -------------------------------------------------------------------------------- Pain Assessment Details Patient Name: Steven Meza Date of Service: 04/16/2019 2:00 PM Medical Record Number: [id number] Patient Account Number: [id number] Date of Birth/Sex: 09-22-1930 (85 y.o. M) Treating RN: Cornell Barman Primary Care Alacia Rehmann: Fulton Reek Other Clinician: Referring Lurlene Ronda: Fulton Reek Treating Feleshia Zundel/Extender: Melburn Hake, HOYT Weeks in Treatment: 2 Active Problems Location of Pain Severity and Description of Pain Patient Has Paino No Site Locations Pain Management and Medication Current Pain Management: Notes Patient denies pain at this time. Electronic Signature(s) Signed: 04/19/2019 11:42:17 AM By: Gretta Cool, BSN, RN, CWS, Kim RN, BSN Entered By: Gretta Cool, BSN, RN, CWS, Kim on 04/16/2019 14:12:28 Steven Meza ([id number]) -------------------------------------------------------------------------------- Patient/Caregiver Education Details Patient Name: Steven Meza Date of Service: 04/16/2019 2:00 PM Medical Record Number: [id number] Patient Account Number: [id number] Date of Birth/Gender: 1930-12-30 (85 y.o. M) Treating RN: Harold Barban Primary Care Physician: Fulton Reek Other Clinician: Referring Physician: Fulton Reek Treating Physician/Extender: Sharalyn Ink in Treatment: 2 Education Assessment Education Provided To: Patient Education Topics Provided Wound/Skin Impairment: Handouts: Caring for Your Ulcer Methods: Demonstration, Explain/Verbal Responses: State content  correctly Electronic Signature(s) Signed: 04/16/2019 4:05:06 PM By: Harold Barban Entered By: Harold Barban on 04/16/2019 15:00:36 Steven Meza ([id number]) -------------------------------------------------------------------------------- Wound Assessment Details Patient Name: Steven Meza Date of Service: 04/16/2019 2:00 PM Medical Record Number: [id number] Patient Account Number: [id number] Date of Birth/Sex: 06-30-1930 (85 y.o. M) Treating RN: Cornell Barman Primary Care Yesika Rispoli: Fulton Reek Other Clinician: Referring Avonte Sensabaugh: Fulton Reek Treating Cormick Moss/Extender: Melburn Hake, HOYT Weeks in Treatment: 2 Wound Status Wound Number: 6 Primary Venous Leg Ulcer Etiology: Wound Location: Left Lower Leg - Circumferential Wound Status: Open Wounding Event: Gradually Appeared Comorbid Lymphedema, Coronary Artery Disease, Date Acquired: 03/02/2019 History: Hypertension Weeks Of Treatment: 2 Clustered Wound: No Photos Wound Measurements Length: (cm) 13 Width: (cm) 29 Depth: (cm) 0.1 Area: (cm) 296.095 Volume: (cm) 29.61 % Reduction in Area: -7.4% % Reduction in Volume: -7.4% Epithelialization: Small (1-33%) Undermining: No Wound Description Classification: Partial Thickness Wound Margin: Indistinct, nonvisible Exudate Amount: Large Exudate Type: Sanguinous Exudate Color: red Foul Odor After Cleansing: No Slough/Fibrino Yes Wound Bed Granulation Amount: Large (67-100%) Exposed Structure Granulation  Quality: Red Fascia Exposed: No Necrotic Amount: Small (1-33%) Fat Layer (Subcutaneous Tissue) Exposed: Yes Necrotic Quality: Adherent Slough Tendon Exposed: No Muscle Exposed: No Joint Exposed: No Bone Exposed: No Treatment Notes Steven Meza, Steven Meza ([id number]) Wound #6 (Left, Circumferential Lower Leg) Notes Silver alginate, BFD gluteus. Page Spiro, 3 Layer right leg Engineer, maintenance) Signed: 04/19/2019 11:42:17 AM By: Gretta Cool, BSN, RN, CWS, Kim RN,  BSN Entered By: Gretta Cool, BSN, RN, CWS, Kim on 04/16/2019 14:19:04 Steven Meza ([id number]) -------------------------------------------------------------------------------- Wound Assessment Details Patient Name: Steven Meza Date of Service: 04/16/2019 2:00 PM Medical Record Number: [id number] Patient Account Number: [id number] Date of Birth/Sex: 03/09/1931 (85 y.o. M) Treating RN: Cornell Barman Primary Care Makiyla Linch: Fulton Reek Other Clinician: Referring Tyshon Fanning: Fulton Reek Treating Devonn Giampietro/Extender: Melburn Hake, HOYT Weeks in Treatment: 2 Wound Status Wound Number: 7 Primary Pressure Ulcer Etiology: Wound Location: Left Gluteus Wound Status: Open Wounding Event: Pressure Injury Comorbid Lymphedema, Coronary Artery Disease, Date Acquired: 04/02/2019 History: Hypertension Weeks Of Treatment: 0 Clustered Wound: No Photos Wound Measurements Length: (cm) 0.5 % Reduction Width: (cm) 1 % Reduction Depth: (cm) 0.1 Epitheliali Area: (cm) 0.393 Tunneling: Volume: (cm) 0.039 Underminin in Area: 0% in Volume: 0% zation: None No g: No Wound Description Classification: Category/Stage II Foul Odor A Wound Margin: Indistinct, nonvisible Slough/Fibr Exudate Amount: Medium Exudate Type: Serous Exudate Color: amber fter Cleansing: No ino No Wound Bed Granulation Amount: Small (1-33%) Exposed Structure Granulation Quality: Red Fascia Exposed: No Necrotic Amount: Large (67-100%) Fat Layer (Subcutaneous Tissue) Exposed: Yes Tendon Exposed: No Muscle Exposed: No Joint Exposed: No Bone Exposed: No Assessment Notes dry scaly skin Steven Meza, Steven Meza ([id number]) Treatment Notes Wound #7 (Left Gluteus) Notes Silver alginate, BFD gluteus. Page Spiro, 3 Layer right leg Engineer, maintenance) Signed: 04/19/2019 11:42:17 AM By: Gretta Cool, BSN, RN, CWS, Kim RN, BSN Entered By: Gretta Cool, BSN, RN, CWS, Kim on 04/16/2019 [id number] Steven Meza  ([id number]) -------------------------------------------------------------------------------- Vitals Details Patient Name: Steven Meza Date of Service: 04/16/2019 2:00 PM Medical Record Number: [id number] Patient Account Number: [id number] Date of Birth/Sex: November 07, 1930 (85 y.o. M) Treating RN: Cornell Barman Primary Care Renu Asby: Fulton Reek Other Clinician: Referring Ferry Matthis: Fulton Reek Treating Daryle Boyington/Extender: Melburn Hake, HOYT Weeks in Treatment: 2 Vital Signs Time Taken: 14:12 Temperature (F): 98.9 Height (in): 65 Pulse (bpm): 95 Weight (lbs): 273 Respiratory Rate (breaths/min): 18 Body Mass Index (BMI): 45.4 Blood Pressure (mmHg): 139/60 Reference Range: 80 - 120 mg / dl Electronic Signature(s) Signed: 04/19/2019 11:42:17 AM By: Gretta Cool, BSN, RN, CWS, Kim RN, BSN Entered By: Gretta Cool, BSN, RN, CWS, Kim on 04/16/2019 14:13:07

## 2019-04-24 ENCOUNTER — Encounter: Payer: Medicare Other | Admitting: Physician Assistant

## 2019-04-24 ENCOUNTER — Other Ambulatory Visit: Payer: Self-pay

## 2019-04-24 DIAGNOSIS — L97822 Non-pressure chronic ulcer of other part of left lower leg with fat layer exposed: Secondary | ICD-10-CM | POA: Diagnosis not present

## 2019-04-24 NOTE — Progress Notes (Signed)
AIREN, MOTTE ([id number]) Visit Report for 04/24/2019 Chief Complaint Document Details Patient Name: Steven Meza, Steven Meza Date of Service: 04/24/2019 1:30 PM Medical Record Number: [id number] Patient Account Number: [id number] Date of Birth/Sex: 1930/07/19 (85 y.o. M) Treating RN: Montey Hora Primary Care Provider: Fulton Reek Other Clinician: Referring Provider: Fulton Reek Treating Provider/Extender: Melburn Hake, Bellamy Judson Weeks in Treatment: 3 Information Obtained from: Patient Chief Complaint Left LE Ulcer Electronic Signature(s) Signed: 04/24/2019 1:41:29 PM By: Worthy Keeler PA-C Entered By: Worthy Keeler on 04/24/2019 13:41:28 Steven Meza ([id number]) -------------------------------------------------------------------------------- HPI Details Patient Name: Steven Meza Date of Service: 04/24/2019 1:30 PM Medical Record Number: [id number] Patient Account Number: [id number] Date of Birth/Sex: August 03, 1930 (85 y.o. M) Treating RN: Montey Hora Primary Care Provider: Fulton Reek Other Clinician: Referring Provider: Fulton Reek Treating Provider/Extender: Melburn Hake, Cordelle Dahmen Weeks in Treatment: 3 History of Present Illness HPI Description: ADMISSION 05/17/2018 Mr. Steven Meza is an 85 year old man who is been treated at the lymphedema clinic for a long period with lymphedema wraps for bilateral lower extremity lymphedema. About 6 months ago they noted small open areas on his left lateral calf just above the ankle. These were open. They are apparently putting some form of ointment on this. They have been referred here for our review of this. The patient has a long history of lymphedema and venous stasis. It says in his records that he has recurrent cellulitis although his wife denies this but she states that the lymphedema may have started with cellulitis several years ago. Also in his record there is a history of bladder cancer which they seem to know very little  about however he did not have any radiation to the pelvis or anything that could have contributed to lymphedema that they are aware of. There is no prior wound history. The patient has 2 small punched out areas on the left lateral calf that have adherent debris on the surface. Past medical history; lymphedema, hypertension, cellulitis, hearing loss, morbid obesity, osteoarthritis, venous stasis, bladder CA, diverticulosis. ABIs in our clinic were 0.36 on the right and 0.57 on the left 05/24/2018; corrected age on this patient is 31 versus what I said last week. He has been for his arterial studies which predictably are not very good. On the right his ABI is 0.65. Monophasic waveforms noted at the right ankle including the posterior tibial and dorsalis pedis. He has triphasic waveforms of the common femoral and profunda femoris triphasic proximal SFA with monophasic distal FSA and popliteal artery. Monophasic tibial waveforms. On the left his ABI is 0.58 monophasic waveforms at the left ankle. Duplex of the left lower extremity demonstrated atherosclerotic change with monophasic waveforms throughout the left lower extremity. There was some concern about left iliac disease and potentially femoral-popliteal and tibial disease. The patient does not describe claudication although according to his wife he over emphasizes his activity. Patient states he is limited by pain in both knees His wounds are on the left lateral calf. 2 small areas with necrotic debris. We have been using silver collagen and light Ace wraps 05/31/2018; wounds on the left lateral calf in the setting of very significant lymphedema and probably PAD. We have been using silver collagen. The base of the small wound looks reasonably improved. I sent him to see Dr. Fletcher Anon however I see now he has an appointment with Dr. Alvester Chou on February 12 2/5; left lateral calf wound looks the same. His wife is doing a good job with her lymphedema wraps  and maintaining that the edema. He most likely has PAD and is due to see Dr. Gwenlyn Found of infectious disease on February 12 2/19; left lateral calf wounds look about the same. This looks like wound secondary to chronic venous insufficiency with lymphedema however the patient has very poor arterial studies. We referred him to Dr. Gwenlyn Found. Dr. Gwenlyn Found did not feel he needed to do anything from an arterial point of view. He did not address the question about the aggressiveness of compression. After some thoughts about this I elected to go ahead and put him in 3 layer compression which after all would be less compression then the lymphedema clinic was putting on this. I am hopeful that this should be enough to get some closure of the small wounds 2/26; left lateral calf wound letter this week. He tolerated the 3 layer compression well furthermore he is sleeping in a hospital bed which helps keep his legs up at night. The wound looks better measuring smaller especially in width 3/3; left lateral calf wound about the same size. The 3 layer compression has really helped get the edema down in his leg however. Using silver collagen 3/11; left lateral calf wound better looking wound surface but about the same size. We have been using 3 layer compression which is really helped get the edema down in the left leg. Even after Dr. Kennon Holter assessment I am reluctant to go to 4 layer compression. He is tolerating 3 layer compression well. We have been using silver collagen JAYNE, GERRITS ([id number]) 3/18-Patient returns for his left lateral calf wound which overall is looking better, slightly increased in size and dimensions and area, he is tolerating the 3 layer compression, has been dressed with silver collagen which we will continue 3/25oleft lateral calf wound much better looking. Size is smaller. He has been using silver collagen 4/1; gradually getting smaller in size. Using silver collagen with 3 layer  compression. I think the patient has some degree of PAD. He saw Dr. Gwenlyn Found in consultation, he did not specifically comment on whether he could tolerate 4 layer compression 4/8; the wound is slightly smaller in depth. We are using 3 layer compression with silver collagen. I had him seen for arterial insufficiency however his ABI was 0.57 in the left leg. Dr. Gwenlyn Found really did not seem to middle on the degree of compression he could tolerate 4/15; the wound is slightly smaller and slightly less deep. We have been using 3 layer compression with silver collagen. There is not an option for 4-layer compression here. 4/22; the patient is making decent progress on his wound on the left lateral calf however he arrives in with a superficial wound on the right anterior tibial area. Were not really sure how this happened. He had been using a stocking on the right leg. He has been using silver collagen on the wounds Amazing to me he is he appears to have compression pumps at home. I am not really sure I knew this before. In any case he has not been using them 4/29; left lateral calf wound continues to get gradually smaller. This looks like it is on its way to closure. oThe area on the right anterior tibial area is about the same in terms of size superficial without any depth. This was probably wrap injury oHe tells me that he has been using his compression pumps once a day for 1 hour without any pain 5/6; not as good today. Left lateral calf wound is actually larger  and he does not have quite as good edema control. The area on the right anterior tibia is about the same still superficial. He states he is using his compression pumps once a day. We are using 3 layer compression on the left and Curlex Coban on the right 5/13; the area on the right is very close to being healed. The area on the left still has some depth but has a healthy wound surface. He has excellent edema control bilaterally. Using Hydrofera  Blue 5/20-Patient returns in 1 week after being in 3 layer compression on the left, the left lateral calf ulcer is stable with some slough, the right anterior tibial ulcer is healed we have been using Kerlix Coban on that leg 5/27; Hydrofera Blue and 3 layer compression. Still some slough in the wound area and some nonviable edges around the wound. 6/3; this patient has lymphedema and chronic venous insufficiency. He had difficult to close wound on his left lateral calf. This is closed today. 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 Finally he has known PAD but he is managed to tolerate the compression we reporting on him and also using his compression pumps that he already had at home twice a day 6/10; we discharge this patient last week having finally close the small area in the left lateral calf. Apparently by Friday of last week this had opened and was draining. He is therefore back in clinic. His wife who is present also was concerned about an area on the right anterior tibia."o 6/17; the patient does not have an open area on the right leg. On the left he has a small circular area anteriorly and the same area laterally that he had last time. However both of these appear to be improved he has his Farrow wrap 30/40. It turns out that he is only using his external compression pumps once a day. I tried to get him up to twice a day today 6/24; the patient comes in with his external compression garment on the right leg/Farrow wrap. As far as we know no open wounds here. He has 2 areas that are smaller one on the left anterior pretibial and one on the left lateral calf which is the site of his initial wounds. I have successfully caught him into his external compression pumps twice a day 7/1; still using his external compression garment on the right leg. As far as we know there are no open wounds here. The area on the left lateral calf which was the site of his  initial wound has closed over. The area on the left anterior pretibial that we identified last week is still open. 7/8; as far as we know he has no open wounds on the right leg. He has his external compression garment on here. The original wound on the left lateral calf has closed over. He developed an area on the left anterior pretibial 2 weeks ago and seems to have 2 smaller areas on the proximal and distal lateral calf. We do not have his good edema control this week as I am used to seeing 7/15; the patient has still a linear area on the left anterior tibia. The area on the left lateral leg looked somewhat inflamed today but no cellulitis. He did have what looked to be a small pustule that I cultured for CandS although I did not give him empiric antibiotics. 7/22; small area on the left anterior tibia. The area on the  left lateral leg still looks inflamed. The pustule that I cultured last week grew Enterobacter and Pseudomonas. I prescribed Cipro they still have not picked that up. In any case the area actually looks fairly satisfactory. He has decent edema control using his compression pumps twice a day 7/29; the area on the left lateral leg looks a lot better than last week. Nothing is open here. He has an area just medial to the mid anterior tibia that is still open that is only open wound today. 8/5-Patient returns at 1 week for the left leg ulcer, which is actually looking better, he is also complaining of left plantar foot pain below the great toe. He does have a hematoma here PRECIOUS, DERMAN ([id number]) 8/12-Patient returns at 1 week, the left leg open area looks about the same if not slightly better, the left plantar hematoma under the great toe is about the same, patient apparently has not been resting his foot on anything for too long period of time and does not wear the Birkenstock shoes unless he walks outdoors which he does not do often 8/19; small wound on the left anterior  lower leg this is just about closed there is nothing on the left lateral leg. He is using his compression pumps He arrives in clinic today with a black eschar over the right first plantar metatarsal head. This was apparently a bruise that was identified by her nurses last week. He wears crocs on his feet. He also may rub these on a footboard at home 8/26; there is nothing open on the left lateral lower leg he has a small area just medial to the tibia. He is going to see his podiatrist Dr. Milinda Pointer about the area on the left plantar first metatarsal head. He is using his external compression pumps. He has a juxta lite on the right leg 9/2; there is nothing open on either side of her left lower leg. He went to see podiatry about the area on the left first metatarsal head. This was apparently debrided. He was given mupirocin and oral antibiotics. I do not believe he is offloading this at all. Readmission: 04/02/2019 upon evaluation today patient actually appears for reevaluation here in our clinic secondary to issues that he is having with his left lower extremity. He has been tolerating the dressing changes without complication. Fortunately there is no signs of active infection at this time. No fevers, chills, nausea, vomiting, or diarrhea. He has been in the hospital since we last saw him and then subsequently transferred back to [facility]. He tells me that they have been changing his dressing once a day and that he has had a significant amount of pain secondary to undergoing these dressing changes. Fortunately there is no evidence of active infection at this point which is good news. No fevers, chills, nausea, vomiting, or diarrhea. With that being said he wonders if we can go back to doing what we were doing before which was when he actually had the dressing changes performed once a week here in the clinic he tells me that he did very well with that. At that point he tolerated a 3 layer  compression wrap which was approved by vascular, Dr. Tyrell Antonio office, to be used previous although 4-layer compression wrap is probably too much for him. 04/16/2019 on evaluation today patient appears to be doing decently well with regard to his lower extremity although he has had a lot of drainage from his leg. A lot of this  I think is just secondary to the lymphedema with that being said he does have some odor as well which may again just be due to more of the excessive moisture although I think that he may also have something that is a result of bacteria again he has somewhat of a Pseudomonas-like smell to the drainage and fortunately though he is not showing any signs of severe cellulitis he is still experiencing quite a bit of drainage and the discoloration is somewhat of a yellow/green in color which is consistent with Pseudomonas. If he can tolerated I think he may do well with Cipro. 04/24/2019 on evaluation today patient appears to be doing well with regard to his wound in the gluteal region which is healing quite well. That is excellent news. With that being said he is also doing better with regard to his left lower extremity overall I feel like things are definitely showing signs of improvement. Electronic Signature(s) Signed: 04/24/2019 2:19:07 PM By: Worthy Keeler PA-C Entered By: Worthy Keeler on 04/24/2019 14:19:06 Steven Meza ([id number]) -------------------------------------------------------------------------------- Physical Exam Details Patient Name: Steven Meza Date of Service: 04/24/2019 1:30 PM Medical Record Number: [id number] Patient Account Number: [id number] Date of Birth/Sex: 01-09-1931 (85 y.o. M) Treating RN: Montey Hora Primary Care Provider: Fulton Reek Other Clinician: Referring Provider: Fulton Reek Treating Provider/Extender: Melburn Hake, Duaa Stelzner Weeks in Treatment: 3 Constitutional Well-nourished and well-hydrated in no acute  distress. Respiratory normal breathing without difficulty. clear to auscultation bilaterally. Cardiovascular regular rate and rhythm with normal S1, S2. Psychiatric this patient is able to make decisions and demonstrates good insight into disease process. Alert and Oriented x 3. pleasant and cooperative. Notes Patient's wound bed currently showed signs of good granulation at this point. Fortunately there is no evidence of active infection at either site and I am very pleased with how things seem to be progressing the patient is likewise extremely happy especially in regard to his left lower extremity. The gluteal region is healing quite nicely and I think is very close to complete closure. Electronic Signature(s) Signed: 04/24/2019 2:19:37 PM By: Worthy Keeler PA-C Entered By: Worthy Keeler on 04/24/2019 14:19:37 Steven Meza ([id number]) -------------------------------------------------------------------------------- Physician Orders Details Patient Name: Steven Meza Date of Service: 04/24/2019 1:30 PM Medical Record Number: [id number] Patient Account Number: [id number] Date of Birth/Sex: [birth year] (85 y.o. M) Treating RN: Montey Hora Primary Care Provider: Fulton Reek Other Clinician: Referring Provider: Fulton Reek Treating Provider/Extender: Melburn Hake, Jassen Sarver Weeks in Treatment: 3 Verbal / Phone Orders: No Diagnosis Coding ICD-10 Coding Meza Description I89.0 Lymphedema, not elsewhere classified I87.2 Venous insufficiency (chronic) (peripheral) L97.822 Non-pressure chronic ulcer of other part of left lower leg with fat layer exposed I10 Essential (primary) hypertension I25.10 Atherosclerotic heart disease of native coronary artery without angina pectoris Wound Cleansing Wound #6 Left,Circumferential Lower Leg o May shower with protection. o No tub bath. Primary Wound Dressing Wound #6 Left,Circumferential Lower Leg o XtraSorb Wound #7 Left  Gluteus o Silver Alginate Secondary Dressing Wound #7 Left Gluteus o Boardered Foam Dressing Dressing Change Frequency Wound #6 Left,Circumferential Lower Leg o Change Dressing Monday, Wednesday, Friday - Home Health to change dressings on Wednesdays and Fridays Wound #7 Left Gluteus o Change Dressing Monday, Wednesday, Friday - [place] to change dressings on Wednesdays and Fridays Follow-up Appointments Wound #6 Left,Circumferential Lower Leg o Return Appointment in 1 week. o Nurse Visit as needed Edema Control Wound #6 Left,Circumferential Lower Leg o 3 Layer Compression System -  Left Lower Extremity Off-Loading JARRETH, SIDBERRY ([id number]) Wound #6 Left,Circumferential Lower Leg o Turn and reposition every 2 hours Wound #7 Left Gluteus o Turn and reposition every 2 hours Home Health Wound #6 Left,Circumferential Lower Leg o [place] Nurse may visit PRN to address patientos wound care needs. o FACE TO FACE ENCOUNTER: MEDICARE and MEDICAID PATIENTS: I certify that this patient is under my care and that I had a face-to-face encounter that meets the physician face-to-face encounter requirements with this patient on this date. The encounter with the patient was in whole or in part for the following MEDICAL CONDITION: (primary reason for [place]) MEDICAL NECESSITY: I certify, that based on my findings, NURSING services are a medically necessary home health service. HOME BOUND STATUS: I certify that my clinical findings support that this patient is homebound (i.e., Due to illness or injury, pt requires aid of supportive devices such as crutches, cane, wheelchairs, walkers, the use of special transportation or the assistance of another person to leave their place of residence. There is a normal inability to leave the home and doing so requires considerable and taxing effort. Other absences are for medical reasons /  religious services and are infrequent or of short duration when for other reasons). o If current dressing causes regression in wound condition, may D/C ordered dressing product/s and apply Normal Saline Moist Dressing daily until next [place] / Other MD appointment. [place] of regression in wound condition at [phone]. o Please direct any NON-WOUND related issues/requests for orders to patient's Primary Care Physician Wound #7 Left [place] Nurse may visit PRN to address patientos wound care needs. o FACE TO FACE ENCOUNTER: MEDICARE and MEDICAID PATIENTS: I certify that this patient is under my care and that I had a face-to-face encounter that meets the physician face-to-face encounter requirements with this patient on this date. The encounter with the patient was in whole or in part for the following MEDICAL CONDITION: (primary reason for [place]) MEDICAL NECESSITY: I certify, that based on my findings, NURSING services are a medically necessary home health service. HOME BOUND STATUS: I certify that my clinical findings support that this patient is homebound (i.e., Due to illness or injury, pt requires aid of supportive devices such as crutches, cane, wheelchairs, walkers, the use of special transportation or the assistance of another person to leave their place of residence. There is a normal inability to leave the home and doing so requires considerable and taxing effort. Other absences are for medical reasons / religious services and are infrequent or of short duration when for other reasons). o If current dressing causes regression in wound condition, may D/C ordered dressing product/s and apply Normal Saline Moist Dressing daily until next [place] / Other MD appointment. [place] of regression in wound condition at [phone]. o Please direct any  NON-WOUND related issues/requests for orders to patient's Primary Care Physician Electronic Signature(s) Unsigned Entered By: Montey Hora on 04/24/2019 14:06:56 Signature(s): Date(s): Steven Meza ([id number]) -------------------------------------------------------------------------------- Problem List Details Patient Name: Steven Meza Date of Service: 04/24/2019 1:30 PM Medical Record Number: [id number] Patient Account Number: [id number] Date of Birth/Sex: 05-30-1930 (85 y.o. M) Treating RN: Montey Hora Primary Care Provider: Fulton Reek Other Clinician: Referring Provider: Fulton Reek Treating Provider/Extender: Melburn Hake, Annelyse Rey Weeks in Treatment: 3 Active Problems ICD-10 Evaluated Encounter Meza Description Active Date Today Diagnosis I89.0  Lymphedema, not elsewhere classified 04/02/2019 No Yes I87.2 Venous insufficiency (chronic) (peripheral) 04/02/2019 No Yes L97.822 Non-pressure chronic ulcer of other part of left lower leg with 04/02/2019 No Yes fat layer exposed I10 Essential (primary) hypertension 04/02/2019 No Yes I25.10 Atherosclerotic heart disease of native coronary artery 04/02/2019 No Yes without angina pectoris Inactive Problems Resolved Problems Electronic Signature(s) Signed: 04/24/2019 1:41:22 PM By: Worthy Keeler PA-C Entered By: Worthy Keeler on 04/24/2019 13:41:22 Steven Meza ([id number]) -------------------------------------------------------------------------------- Progress Note Details Patient Name: Steven Meza Date of Service: 04/24/2019 1:30 PM Medical Record Number: [id number] Patient Account Number: [id number] Date of Birth/Sex: 1931-01-19 (85 y.o. M) Treating RN: Montey Hora Primary Care Provider: Fulton Reek Other Clinician: Referring Provider: Fulton Reek Treating Provider/Extender: Melburn Hake, Nea Gittens Weeks in Treatment: 3 Subjective Chief Complaint Information obtained from Patient Left LE  Ulcer History of Present Illness (HPI) ADMISSION 05/17/2018 Mr. Siler is an 85 year old man who is been treated at the lymphedema clinic for a long period with lymphedema wraps for bilateral lower extremity lymphedema. About 6 months ago they noted small open areas on his left lateral calf just above the ankle. These were open. They are apparently putting some form of ointment on this. They have been referred here for our review of this. The patient has a long history of lymphedema and venous stasis. It says in his records that he has recurrent cellulitis although his wife denies this but she states that the lymphedema may have started with cellulitis several years ago. Also in his record there is a history of bladder cancer which they seem to know very little about however he did not have any radiation to the pelvis or anything that could have contributed to lymphedema that they are aware of. There is no prior wound history. The patient has 2 small punched out areas on the left lateral calf that have adherent debris on the surface. Past medical history; lymphedema, hypertension, cellulitis, hearing loss, morbid obesity, osteoarthritis, venous stasis, bladder CA, diverticulosis. ABIs in our clinic were 0.36 on the right and 0.57 on the left 05/24/2018; corrected age on this patient is 89 versus what I said last week. He has been for his arterial studies which predictably are not very good. On the right his ABI is 0.65. Monophasic waveforms noted at the right ankle including the posterior tibial and dorsalis pedis. He has triphasic waveforms of the common femoral and profunda femoris triphasic proximal SFA with monophasic distal FSA and popliteal artery. Monophasic tibial waveforms. On the left his ABI is 0.58 monophasic waveforms at the left ankle. Duplex of the left lower extremity demonstrated atherosclerotic change with monophasic waveforms throughout the left lower extremity. There was some  concern about left iliac disease and potentially femoral-popliteal and tibial disease. The patient does not describe claudication although according to his wife he over emphasizes his activity. Patient states he is limited by pain in both knees His wounds are on the left lateral calf. 2 small areas with necrotic debris. We have been using silver collagen and light Ace wraps 05/31/2018; wounds on the left lateral calf in the setting of very significant lymphedema and probably PAD. We have been using silver collagen. The base of the small wound looks reasonably improved. I sent him to see Dr. Fletcher Anon however I see now he has an appointment with Dr. Alvester Chou on February 12 2/5; left lateral calf wound looks the same. His wife is doing a good job with her lymphedema wraps and maintaining that  the edema. He most likely has PAD and is due to see Dr. Gwenlyn Found of infectious disease on February 12 2/19; left lateral calf wounds look about the same. This looks like wound secondary to chronic venous insufficiency with lymphedema however the patient has very poor arterial studies. We referred him to Dr. Gwenlyn Found. Dr. Gwenlyn Found did not feel he needed to do anything from an arterial point of view. He did not address the question about the aggressiveness of compression. After some thoughts about this I elected to go ahead and put him in 3 layer compression which after all would be less compression then the lymphedema clinic was putting on this. I am hopeful that this should be enough to get some closure of the small wounds 2/26; left lateral calf wound letter this week. He tolerated the 3 layer compression well furthermore he is sleeping in a hospital RICHARDSON, KLEINOW A. ([id number]) bed which helps keep his legs up at night. The wound looks better measuring smaller especially in width 3/3; left lateral calf wound about the same size. The 3 layer compression has really helped get the edema down in his leg however. Using silver  collagen 3/11; left lateral calf wound better looking wound surface but about the same size. We have been using 3 layer compression which is really helped get the edema down in the left leg. Even after Dr. Kennon Holter assessment I am reluctant to go to 4 layer compression. He is tolerating 3 layer compression well. We have been using silver collagen 3/18-Patient returns for his left lateral calf wound which overall is looking better, slightly increased in size and dimensions and area, he is tolerating the 3 layer compression, has been dressed with silver collagen which we will continue 3/25 left lateral calf wound much better looking. Size is smaller. He has been using silver collagen 4/1; gradually getting smaller in size. Using silver collagen with 3 layer compression. I think the patient has some degree of PAD. He saw Dr. Gwenlyn Found in consultation, he did not specifically comment on whether he could tolerate 4 layer compression 4/8; the wound is slightly smaller in depth. We are using 3 layer compression with silver collagen. I had him seen for arterial insufficiency however his ABI was 0.57 in the left leg. Dr. Gwenlyn Found really did not seem to middle on the degree of compression he could tolerate 4/15; the wound is slightly smaller and slightly less deep. We have been using 3 layer compression with silver collagen. There is not an option for 4-layer compression here. 4/22; the patient is making decent progress on his wound on the left lateral calf however he arrives in with a superficial wound on the right anterior tibial area. Were not really sure how this happened. He had been using a stocking on the right leg. He has been using silver collagen on the wounds Amazing to me he is he appears to have compression pumps at home. I am not really sure I knew this before. In any case he has not been using them 4/29; left lateral calf wound continues to get gradually smaller. This looks like it is on its way to  closure. The area on the right anterior tibial area is about the same in terms of size superficial without any depth. This was probably wrap injury He tells me that he has been using his compression pumps once a day for 1 hour without any pain 5/6; not as good today. Left lateral calf wound is actually larger and  he does not have quite as good edema control. The area on the right anterior tibia is about the same still superficial. He states he is using his compression pumps once a day. We are using 3 layer compression on the left and Curlex Coban on the right 5/13; the area on the right is very close to being healed. The area on the left still has some depth but has a healthy wound surface. He has excellent edema control bilaterally. Using Hydrofera Blue 5/20-Patient returns in 1 week after being in 3 layer compression on the left, the left lateral calf ulcer is stable with some slough, the right anterior tibial ulcer is healed we have been using Kerlix Coban on that leg 5/27; Hydrofera Blue and 3 layer compression. Still some slough in the wound area and some nonviable edges around the wound. 6/3; this patient has lymphedema and chronic venous insufficiency. He had difficult to close wound on his left lateral calf. This is closed today. 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 Finally he has known PAD but he is managed to tolerate the compression we reporting on him and also using his compression pumps that he already had at home twice a day 6/10; we discharge this patient last week having finally close the small area in the left lateral calf. Apparently by Friday of last week this had opened and was draining. He is therefore back in clinic. His wife who is present also was concerned about an area on the right anterior tibia."o 6/17; the patient does not have an open area on the right leg. On the left he has a small circular area anteriorly and the  same area laterally that he had last time. However both of these appear to be improved he has his Farrow wrap 30/40. It turns out that he is only using his external compression pumps once a day. I tried to get him up to twice a day today 6/24; the patient comes in with his external compression garment on the right leg/Farrow wrap. As far as we know no open wounds here. He has 2 areas that are smaller one on the left anterior pretibial and one on the left lateral calf which is the site of his initial wounds. I have successfully caught him into his external compression pumps twice a day 7/1; still using his external compression garment on the right leg. As far as we know there are no open wounds here. The area on the left lateral calf which was the site of his initial wound has closed over. The area on the left anterior pretibial that we identified last week is still open. 7/8; as far as we know he has no open wounds on the right leg. He has his external compression garment on here. The original wound on the left lateral calf has closed over. He developed an area on the left anterior pretibial 2 weeks ago and seems to have 2 smaller areas on the proximal and distal lateral calf. We do not have his good edema control this week as I am used to seeing 7/15; the patient has still a linear area on the left anterior tibia. The area on the left lateral leg looked somewhat inflamed today but no cellulitis. He did have what looked to be a small pustule that I cultured for CandS although I did not give him empiric antibiotics. 7/22; small area on the left anterior tibia. The area on the left lateral  leg still looks inflamed. The pustule that I cultured last NAYTHON, BATE. ([id number]) week grew Enterobacter and Pseudomonas. I prescribed Cipro they still have not picked that up. In any case the area actually looks fairly satisfactory. He has decent edema control using his compression pumps twice a day 7/29;  the area on the left lateral leg looks a lot better than last week. Nothing is open here. He has an area just medial to the mid anterior tibia that is still open that is only open wound today. 8/5-Patient returns at 1 week for the left leg ulcer, which is actually looking better, he is also complaining of left plantar foot pain below the great toe. He does have a hematoma here 8/12-Patient returns at 1 week, the left leg open area looks about the same if not slightly better, the left plantar hematoma under the great toe is about the same, patient apparently has not been resting his foot on anything for too long period of time and does not wear the [employer]ken[employer] shoes unless he walks outdoors which he does not do often 8/19; small wound on the left anterior lower leg this is just about closed there is nothing on the left lateral leg. He is using his compression pumps He arrives in clinic today with a black eschar over the right first plantar metatarsal head. This was apparently a bruise that was identified by her nurses last week. He wears crocs on his feet. He also may rub these on a footboard at home 8/26; there is nothing open on the left lateral lower leg he has a small area just medial to the tibia. He is going to see his podiatrist Dr. Milinda Pointer about the area on the left plantar first metatarsal head. He is using his external compression pumps. He has a juxta lite on the right leg 9/2; there is nothing open on either side of her left lower leg. He went to see podiatry about the area on the left first metatarsal head. This was apparently debrided. He was given mupirocin and oral antibiotics. I do not believe he is offloading this at all. Readmission: 04/02/2019 upon evaluation today patient actually appears for reevaluation here in our clinic secondary to issues that he is having with his left lower extremity. He has been tolerating the dressing changes without complication. Fortunately there is  no signs of active infection at this time. No fevers, chills, nausea, vomiting, or diarrhea. He has been in the hospital since we last saw him and then subsequently transferred back to [facility]. He tells me that they have been changing his dressing once a day and that he has had a significant amount of pain secondary to undergoing these dressing changes. Fortunately there is no evidence of active infection at this point which is good news. No fevers, chills, nausea, vomiting, or diarrhea. With that being said he wonders if we can go back to doing what we were doing before which was when he actually had the dressing changes performed once a week here in the clinic he tells me that he did very well with that. At that point he tolerated a 3 layer compression wrap which was approved by vascular, Dr. Tyrell Antonio office, to be used previous although 4-layer compression wrap is probably too much for him. 04/16/2019 on evaluation today patient appears to be doing decently well with regard to his lower extremity although he has had a lot of drainage from his leg. A lot of this I think  is just secondary to the lymphedema with that being said he does have some odor as well which may again just be due to more of the excessive moisture although I think that he may also have something that is a result of bacteria again he has somewhat of a Pseudomonas-like smell to the drainage and fortunately though he is not showing any signs of severe cellulitis he is still experiencing quite a bit of drainage and the discoloration is somewhat of a yellow/green in color which is consistent with Pseudomonas. If he can tolerated I think he may do well with Cipro. 04/24/2019 on evaluation today patient appears to be doing well with regard to his wound in the gluteal region which is healing quite well. That is excellent news. With that being said he is also doing better with regard to his left lower extremity overall I feel  like things are definitely showing signs of improvement. Patient History Information obtained from Patient. Family History Cancer - Mother, Diabetes - Siblings, Heart Disease - Mother, No family history of Hereditary Spherocytosis, Hypertension, Kidney Disease, Lung Disease, Seizures, Stroke, Thyroid Problems, Tuberculosis. Social History Former smoker, Alcohol Use - Moderate, Drug Use - No History, Caffeine Use - Daily. Medical History Eyes Denies history of Cataracts, Glaucoma, Optic Neuritis MERLAND, ALDANA ([id number]) Ear/Nose/Mouth/Throat Denies history of Chronic sinus problems/congestion, Middle ear problems Hematologic/Lymphatic Patient has history of Lymphedema Denies history of Anemia, Hemophilia, Human Immunodeficiency Virus, Sickle Cell Disease Respiratory Denies history of Aspiration, Asthma, Chronic Obstructive Pulmonary Disease (COPD), Pneumothorax, Sleep Apnea, Tuberculosis Cardiovascular Patient has history of Coronary Artery Disease, Hypertension Denies history of Angina, Arrhythmia, Congestive Heart Failure, Deep Vein Thrombosis, Hypotension, Myocardial Infarction, Peripheral Arterial Disease, Peripheral Venous Disease, Phlebitis, Vasculitis Gastrointestinal Denies history of Cirrhosis , Colitis, Crohn s, Hepatitis A, Hepatitis B, Hepatitis C Endocrine Denies history of Type I Diabetes, Type II Diabetes Genitourinary Denies history of End Stage Renal Disease Immunological Denies history of Lupus Erythematosus, Raynaud s, Scleroderma Integumentary (Skin) Denies history of History of Burn, History of pressure wounds Musculoskeletal Denies history of Gout, Rheumatoid Arthritis, Osteoarthritis, Osteomyelitis Neurologic Denies history of Dementia, Neuropathy, Quadriplegia, Paraplegia, Seizure Disorder Oncologic Denies history of Received Chemotherapy, Received Radiation Medical And Surgical History Notes Ear/Nose/Mouth/Throat hoh Review of Systems  (ROS) Constitutional Symptoms (General Health) Denies complaints or symptoms of Fatigue, Fever, Chills, Marked Weight Change. Respiratory Denies complaints or symptoms of Chronic or frequent coughs, Shortness of Breath. Cardiovascular Complains or has symptoms of LE edema. Denies complaints or symptoms of Chest pain. Psychiatric Denies complaints or symptoms of Anxiety, Claustrophobia. Objective Constitutional Well-nourished and well-hydrated in no acute distress. Vitals Time Taken: 1:28 PM, Height: 65 in, Weight: 273 lbs, BMI: 45.4, Temperature: 98.5 F, Pulse: 89 bpm, Respiratory Rate: 18 breaths/min, Blood Pressure: 139/54 mmHg. DAXYN, BERENDT ([id number]) Respiratory normal breathing without difficulty. clear to auscultation bilaterally. Cardiovascular regular rate and rhythm with normal S1, S2. Psychiatric this patient is able to make decisions and demonstrates good insight into disease process. Alert and Oriented x 3. pleasant and cooperative. General Notes: Patient's wound bed currently showed signs of good granulation at this point. Fortunately there is no evidence of active infection at either site and I am very pleased with how things seem to be progressing the patient is likewise extremely happy especially in regard to his left lower extremity. The gluteal region is healing quite nicely and I think is very close to complete closure. Integumentary (Hair, Skin) Wound #6 status is Open. Original cause of  wound was Gradually Appeared. The wound is located on the Left,Circumferential Lower Leg. The wound measures 11cm length x 28cm width x 0.1cm depth; 241.903cm^2 area and 24.19cm^3 volume. Wound #7 status is Open. Original cause of wound was Pressure Injury. The wound is located on the Left Gluteus. The wound measures 0.4cm length x 0.5cm width x 0.1cm depth; 0.157cm^2 area and 0.016cm^3 volume. There is Fat Layer (Subcutaneous Tissue) Exposed exposed. There is no tunneling  or undermining noted. There is a medium amount of serous drainage noted. The wound margin is indistinct and nonvisible. There is medium (34-66%) red granulation within the wound bed. There is a small (1-33%) amount of necrotic tissue within the wound bed including Adherent Slough. Assessment Active Problems ICD-10 Lymphedema, not elsewhere classified Venous insufficiency (chronic) (peripheral) Non-pressure chronic ulcer of other part of left lower leg with fat layer exposed Essential (primary) hypertension Atherosclerotic heart disease of native coronary artery without angina pectoris Procedures Wound #6 Pre-procedure diagnosis of Wound #6 is a Venous Leg Ulcer located on the Left,Circumferential Lower Leg . There was a Three Layer Compression Therapy Procedure with a pre-treatment ABI of 0.6 by Montey Hora, RN. Post procedure Diagnosis Wound #6: Same as Pre-Procedure CASIMIER, DUMITRESCU ([id number]) Plan Wound Cleansing: Wound #6 Left,Circumferential Lower Leg: May shower with protection. No tub bath. Primary Wound Dressing: Wound #6 Left,Circumferential Lower Leg: XtraSorb Wound #7 Left Gluteus: Silver Alginate Secondary Dressing: Wound #7 Left Gluteus: Boardered Foam Dressing Dressing Change Frequency: Wound #6 Left,Circumferential Lower Leg: Change Dressing Monday, Wednesday, Friday - Home Health to change dressings on Wednesdays and Fridays Wound #7 Left Gluteus: Change Dressing Monday, Wednesday, Friday - [place] to change dressings on Wednesdays and Fridays Follow-up Appointments: Wound #6 Left,Circumferential Lower Leg: Return Appointment in 1 week. Nurse Visit as needed Edema Control: Wound #6 Left,Circumferential Lower Leg: 3 Layer Compression System - Left Lower Extremity Off-Loading: Wound #6 Left,Circumferential Lower Leg: Turn and reposition every 2 hours Wound #7 Left Gluteus: Turn and reposition every 2 hours Home Health: Wound #6  Left,Circumferential Lower Leg: [place] Nurse may visit PRN to address patient s wound care needs. FACE TO FACE ENCOUNTER: MEDICARE and MEDICAID PATIENTS: I certify that this patient is under my care and that I had a face-to-face encounter that meets the physician face-to-face encounter requirements with this patient on this date. The encounter with the patient was in whole or in part for the following MEDICAL CONDITION: (primary reason for [place]) MEDICAL NECESSITY: I certify, that based on my findings, NURSING services are a medically necessary home health service. HOME BOUND STATUS: I certify that my clinical findings support that this patient is homebound (i.e., Due to illness or injury, pt requires aid of supportive devices such as crutches, cane, wheelchairs, walkers, the use of special transportation or the assistance of another person to leave their place of residence. There is a normal inability to leave the home and doing so requires considerable and taxing effort. Other absences are for medical reasons / religious services and are infrequent or of short duration when for other reasons). If current dressing causes regression in wound condition, may D/C ordered dressing product/s and apply Normal Saline Moist Dressing daily until next [place] / Other MD appointment. Alda of regression in wound condition at [phone]. Please direct any NON-WOUND related issues/requests for orders to patient's Primary Care Physician Wound #7 Left Gluteus: [place] Nurse may visit  PRN to address patient s wound care needs. FACE TO FACE ENCOUNTER: MEDICARE and MEDICAID PATIENTS: I certify that this patient is under my care and that I had a face-to-face encounter that meets the physician face-to-face encounter requirements with this patient on this date. The encounter with the patient was in whole  or in part for the following MEDICAL CONDITION: (primary reason for [place]) MEDICAL NECESSITY: I certify, that based on my findings, NURSING services are a medically necessary home health service. HOME BOUND STATUS: I certify that my clinical findings support that this patient is homebound (i.e., Due to LINNIE, MCCLAY ([id number]) illness or injury, pt requires aid of supportive devices such as crutches, cane, wheelchairs, walkers, the use of special transportation or the assistance of another person to leave their place of residence. There is a normal inability to leave the home and doing so requires considerable and taxing effort. Other absences are for medical reasons / religious services and are infrequent or of short duration when for other reasons). If current dressing causes regression in wound condition, may D/C ordered dressing product/s and apply Normal Saline Moist Dressing daily until next [place] / Other MD appointment. [name] of regression in wound condition at [phone]. Please direct any NON-WOUND related issues/requests for orders to patient's Primary Care Physician 1. My suggestion at this time is can be that we go ahead and continue with the current wound care measures utilizing silver alginate over the gluteal region. 2. We will also continue with the XtraSorb over the lower extremity at this time I still think that is probably the best thing to use currently. 3. We will continue with 3 layer compression wrap which seems to be beneficial for him. 4. I am also going to suggest that he continue to elevate his legs as much as possible to try to help with edema control. We will see patient back for reevaluation in 1 week here in the clinic. If anything worsens or changes patient will contact our office for additional recommendations. Electronic Signature(s) Signed: 04/24/2019 2:20:12 PM By: Worthy Keeler PA-C Entered By: Worthy Keeler on 04/24/2019 14:20:12 Steven Meza ([id number]) -------------------------------------------------------------------------------- ROS/PFSH Details Patient Name: Steven Meza Date of Service: 04/24/2019 1:30 PM Medical Record Number: [id number] Patient Account Number: [id number] Date of Birth/Sex: Aug 01, 1930 (85 y.o. M) Treating RN: Montey Hora Primary Care Provider: Fulton Reek Other Clinician: Referring Provider: Fulton Reek Treating Provider/Extender: Melburn Hake, [place] Shampine Weeks in Treatment: 3 Information Obtained From Patient Constitutional Symptoms (General Health) Complaints and Symptoms: Negative for: Fatigue; Fever; Chills; Marked Weight Change Respiratory Complaints and Symptoms: Negative for: Chronic or frequent coughs; Shortness of Breath Medical History: Negative for: Aspiration; Asthma; Chronic Obstructive Pulmonary Disease (COPD); Pneumothorax; Sleep Apnea; Tuberculosis Cardiovascular Complaints and Symptoms: Positive for: LE edema Negative for: Chest pain Medical History: Positive for: Coronary Artery Disease; Hypertension Negative for: Angina; Arrhythmia; Congestive Heart Failure; Deep Vein Thrombosis; Hypotension; Myocardial Infarction; Peripheral Arterial Disease; Peripheral Venous Disease; Phlebitis; Vasculitis Psychiatric Complaints and Symptoms: Negative for: Anxiety; Claustrophobia Eyes Medical History: Negative for: Cataracts; Glaucoma; Optic Neuritis Ear/Nose/Mouth/Throat Medical History: Negative for: Chronic sinus problems/congestion; Middle ear problems Past Medical History Notes: hoh Hematologic/Lymphatic Medical History: Positive for: Lymphedema Negative for: Anemia; Hemophilia; Human Immunodeficiency Virus; Sickle Cell Disease SUYASH, SCHACK ([id number]) Gastrointestinal Medical History: Negative for: Cirrhosis ; Colitis; Crohnos; Hepatitis A; Hepatitis B; Hepatitis C Endocrine Medical History: Negative for:  Type I Diabetes; Type II Diabetes Genitourinary Medical History:  Negative for: End Stage Renal Disease Immunological Medical History: Negative for: Lupus Erythematosus; Raynaudos; Scleroderma Integumentary (Skin) Medical History: Negative for: History of Burn; History of pressure wounds Musculoskeletal Medical History: Negative for: Gout; Rheumatoid Arthritis; Osteoarthritis; Osteomyelitis Neurologic Medical History: Negative for: Dementia; Neuropathy; Quadriplegia; Paraplegia; Seizure Disorder Oncologic Medical History: Negative for: Received Chemotherapy; Received Radiation Immunizations Pneumococcal Vaccine: Received Pneumococcal Vaccination: Yes Tetanus Vaccine: Last tetanus shot: 01/02/2018 Implantable Devices None Family and Social History Cancer: Yes - Mother; Diabetes: Yes - Siblings; Heart Disease: Yes - Mother; Hereditary Spherocytosis: No; Hypertension: No; Kidney Disease: No; Lung Disease: No; Seizures: No; Stroke: No; Thyroid Problems: No; Tuberculosis: No; Former smoker; Alcohol Use: Moderate; Drug Use: No History; Caffeine Use: Daily; Financial Concerns: No; Food, Clothing or Shelter Needs: No; Support System Lacking: No; Transportation Concerns: No Physician Affirmation I have reviewed and agree with the above information. [name], [name] ([id number]) Electronic Signature(s) Unsigned Entered By: Worthy Keeler on 04/24/2019 14:19:23 Signature(s): Date(s): Steven Meza ([id number]) -------------------------------------------------------------------------------- SuperBill Details Patient Name: Steven Meza Date of Service: 04/24/2019 Medical Record Number: [id number] Patient Account Number: [id number] Date of Birth/Sex: 1931/02/11 (85 y.o. M) Treating RN: Montey Hora Primary Care Provider: Fulton Reek Other Clinician: Referring Provider: Fulton Reek Treating Provider/Extender: Melburn Hake, Jhanvi Drakeford Weeks in Treatment: 3 Diagnosis  Coding ICD-10 Codes Meza Description I89.0 Lymphedema, not elsewhere classified I87.2 Venous insufficiency (chronic) (peripheral) L97.822 Non-pressure chronic ulcer of other part of left lower leg with fat layer exposed I10 Essential (primary) hypertension I25.10 Atherosclerotic heart disease of native coronary artery without angina pectoris Facility Procedures CPT4 Meza: [id number] Description: (Facility Use Only) [phone] - [place] [facility] LT LEG Modifier: Quantity: 1 Physician Procedures CPT4 Meza Description: [id number] - WC PHYS LEVEL 4 - EST PT ICD-10 Diagnosis Description I89.0 Lymphedema, not elsewhere classified I87.2 Venous insufficiency (chronic) (peripheral) L97.822 Non-pressure chronic ulcer of other part of left lower leg  wit I10 Essential (primary) hypertension Modifier: h fat layer expos Quantity: 1 ed Electronic Signature(s) Signed: 04/24/2019 2:20:28 PM By: Worthy Keeler PA-C Entered By: Worthy Keeler on 04/24/2019 14:20:27

## 2019-04-25 NOTE — Progress Notes (Signed)
BAZE, BEEVERS ([id number]) Visit Report for 04/24/2019 Arrival Information Details Patient Name: MARKESE, Steven Meza Date of Service: 04/24/2019 1:30 PM Medical Record Number: [id number] Patient Account Number: [id number] Date of Birth/Sex: [birth year] (85 y.o. M) Treating RN: Montey Hora Primary Care Katrin Grabel: Fulton Reek Other Clinician: Referring Jakarius Flamenco: Fulton Reek Treating Helayna Dun/Extender: Melburn Hake, HOYT Weeks in Treatment: 3 Visit Information History Since Last Visit Added or deleted any medications: No Patient Arrived: Walker Any new allergies or adverse reactions: No Arrival Time: 13:27 Had a fall or experienced change in No Accompanied By: wife activities of daily living that may affect Transfer Assistance: None risk of falls: Patient Identification Verified: Yes Signs or symptoms of abuse/neglect since last visito No Secondary Verification Process Completed: Yes Hospitalized since last visit: No Implantable device outside of the clinic excluding No cellular tissue based products placed in the center since last visit: Has Dressing in Place as Prescribed: Yes Has Compression in Place as Prescribed: Yes Pain Present Now: No Electronic Signature(s) Signed: 04/24/2019 3:55:13 PM By: Lorine Bears RCP, RRT, CHT Entered By: Lorine Bears on 04/24/2019 13:28:22 Steven Meza ([id number]) -------------------------------------------------------------------------------- Compression Therapy Details Patient Name: Steven Meza Date of Service: 04/24/2019 1:30 PM Medical Record Number: [id number] Patient Account Number: [id number] Date of Birth/Sex: 30-Mar-1931 (85 y.o. M) Treating RN: Montey Hora Primary Care Yoona Ishii: Fulton Reek Other Clinician: Referring Lister Brizzi: Fulton Reek Treating Kearston Putman/Extender: Melburn Hake, HOYT Weeks in Treatment: 3 Compression Therapy Performed for Wound Assessment: Wound #6  Left,Circumferential Lower Leg Performed By: Clinician Montey Hora, RN Compression Type: Three Layer Pre Treatment ABI: 0.6 Post Procedure Diagnosis Same as Pre-procedure Electronic Signature(s) Signed: 04/24/2019 4:45:05 PM By: Montey Hora Entered By: Montey Hora on 04/24/2019 14:05:54 Steven Meza ([id number]) -------------------------------------------------------------------------------- Encounter Discharge Information Details Patient Name: Steven Meza Date of Service: 04/24/2019 1:30 PM Medical Record Number: [id number] Patient Account Number: [id number] Date of Birth/Sex: 06-07-1930 (85 y.o. M) Treating RN: Montey Hora Primary Care Libia Fazzini: Fulton Reek Other Clinician: Referring Parris Signer: Fulton Reek Treating Jermine Bibbee/Extender: Melburn Hake, HOYT Weeks in Treatment: 3 Encounter Discharge Information Items Discharge Condition: Stable Ambulatory Status: Walker Discharge Destination: Home Transportation: Private Auto Accompanied By: spouse Schedule Follow-up Appointment: Yes Clinical Summary of Care: Electronic Signature(s) Signed: 04/24/2019 4:45:05 PM By: Montey Hora Entered By: Montey Hora on 04/24/2019 14:08:23 Steven Meza ([id number]) -------------------------------------------------------------------------------- Lower Extremity Assessment Details Patient Name: Steven Meza Date of Service: 04/24/2019 1:30 PM Medical Record Number: [id number] Patient Account Number: [id number] Date of Birth/Sex: 1930-12-27 (85 y.o. M) Treating RN: Army Melia Primary Care Dorien Mayotte: Fulton Reek Other Clinician: Referring Ramandeep Arington: Fulton Reek Treating Iyonna Rish/Extender: Melburn Hake, HOYT Weeks in Treatment: 3 Edema Assessment Assessed: [Left: No] [Right: No] Edema: [Left: N] [Right: o] Calf Left: Right: Point of Measurement: 33 cm From Medial Instep 47 cm cm Ankle Left: Right: Point of Measurement: 12 cm From Medial Instep 28.5  cm cm Vascular Assessment Pulses: Dorsalis Pedis Palpable: [Left:Yes] Electronic Signature(s) Signed: 04/25/2019 11:52:26 AM By: Army Melia Entered By: Army Melia on 04/24/2019 13:40:31 Steven Meza ([id number]) -------------------------------------------------------------------------------- Multi Wound Chart Details Patient Name: Steven Meza Date of Service: 04/24/2019 1:30 PM Medical Record Number: [id number] Patient Account Number: [id number] Date of Birth/Sex: [birth year] (85 y.o. M) Treating RN: Montey Hora Primary Care Onya Eutsler: Fulton Reek Other Clinician: Referring Lynzy Rawles: Fulton Reek Treating Brannon Levene/Extender: Melburn Hake, HOYT Weeks in Treatment: 3 Vital Signs Height(in): 65 Pulse(bpm): 89 Weight(lbs): [id number] Blood Pressure(mmHg): 139/54 Body Mass Index(BMI): 45 Temperature(F): 98.5 Respiratory Rate  18 (breaths/min): Photos: [6:No Photos] [N/A:N/A] Wound Location: Left, Circumferential Lower Left Gluteus N/A Leg Wounding Event: Gradually Appeared Pressure Injury N/A Primary Etiology: Venous Leg Ulcer Pressure Ulcer N/A Comorbid History: N/A Lymphedema, Coronary Artery N/A Disease, Hypertension Date Acquired: 03/02/2019 04/02/2019 N/A Weeks of Treatment: 3 1 N/A Wound Status: Open Open N/A Measurements L x W x D 11x28x0.1 0.4x0.5x0.1 N/A (cm) Area (cm) : 241.903 0.157 N/A Volume (cm) : 24.19 0.016 N/A % Reduction in Area: 12.30% 60.10% N/A % Reduction in Volume: 12.30% 59.00% N/A Classification: Partial Thickness Category/Stage II N/A Exudate Amount: N/A Medium N/A Exudate Type: N/A Serous N/A Exudate Color: N/A amber N/A Wound Margin: N/A Indistinct, nonvisible N/A Granulation Amount: N/A Medium (34-66%) N/A Granulation Quality: N/A Red N/A Necrotic Amount: N/A Small (1-33%) N/A Epithelialization: N/A Small (1-33%) N/A Treatment Notes Electronic Signature(s) JAYLN, HULTON ([id number]) Signed: 04/24/2019 4:45:05 PM By:  Montey Hora Entered By: Montey Hora on 04/24/2019 14:04:31 Steven Meza ([id number]) -------------------------------------------------------------------------------- Multi-Disciplinary Care Plan Details Patient Name: Steven Meza Date of Service: 04/24/2019 1:30 PM Medical Record Number: [id number] Patient Account Number: [id number] Date of Birth/Sex: 1930-05-18 (85 y.o. M) Treating RN: Montey Hora Primary Care Isiac Breighner: Fulton Reek Other Clinician: Referring Zyon Grout: Fulton Reek Treating Jalani Cullifer/Extender: Melburn Hake, HOYT Weeks in Treatment: 3 Active Inactive Abuse / Safety / Falls / Self Care Management Nursing Diagnoses: Potential for falls Goals: Patient will remain injury free related to falls Date Initiated: 04/24/2019 Target Resolution Date: 07/14/2019 Goal Status: Active Interventions: Assess fall risk on admission and as needed Notes: Venous Leg Ulcer Nursing Diagnoses: Actual venous Insuffiency (use after diagnosis is confirmed) Goals: Patient will maintain optimal edema control Date Initiated: 04/24/2019 Target Resolution Date: 07/14/2019 Goal Status: Active Interventions: Compression as ordered Notes: Electronic Signature(s) Signed: 04/24/2019 4:45:05 PM By: Montey Hora Entered By: Montey Hora on 04/24/2019 14:05:11 Steven Meza ([id number]) -------------------------------------------------------------------------------- Pain Assessment Details Patient Name: Steven Meza Date of Service: 04/24/2019 1:30 PM Medical Record Number: [id number] Patient Account Number: [id number] Date of Birth/Sex: 09/20/1930 (85 y.o. M) Treating RN: Montey Hora Primary Care Idolina Mantell: Fulton Reek Other Clinician: Referring Daley Gosse: Fulton Reek Treating Aleatha Taite/Extender: Melburn Hake, HOYT Weeks in Treatment: 3 Active Problems Location of Pain Severity and Description of Pain Patient Has Paino No Site Locations Pain Management  and Medication Current Pain Management: Electronic Signature(s) Signed: 04/24/2019 3:55:13 PM By: Paulla Fore, RRT, CHT Signed: 04/24/2019 4:45:05 PM By: Montey Hora Entered By: Lorine Bears on 04/24/2019 13:28:30 Steven Meza ([id number]) -------------------------------------------------------------------------------- Patient/Caregiver Education Details Patient Name: Steven Meza Date of Service: 04/24/2019 1:30 PM Medical Record Number: [id number] Patient Account Number: [id number] Date of Birth/Gender: 12-13-1930 (85 y.o. M) Treating RN: Montey Hora Primary Care Physician: Fulton Reek Other Clinician: Referring Physician: Fulton Reek Treating Physician/Extender: Sharalyn Ink in Treatment: 3 Education Assessment Education Provided To: Patient and Caregiver Education Topics Provided Wound/Skin Impairment: Handouts: Other: care of glutea wound Methods: Demonstration, Explain/Verbal Responses: State content correctly Electronic Signature(s) Signed: 04/24/2019 4:45:05 PM By: Montey Hora Entered By: Montey Hora on 04/24/2019 14:07:42 Steven Meza ([id number]) -------------------------------------------------------------------------------- Wound Assessment Details Patient Name: Steven Meza Date of Service: 04/24/2019 1:30 PM Medical Record Number: [id number] Patient Account Number: [id number] Date of Birth/Sex: 02-24-1931 (85 y.o. M) Treating RN: Army Melia Primary Care Marlo Goodrich: Fulton Reek Other Clinician: Referring Gwenevere Goga: Fulton Reek Treating Renard Caperton/Extender: Melburn Hake, HOYT Weeks in Treatment: 3 Wound Status Wound Number: 6 Primary Etiology: Venous Leg Ulcer Wound Location: Left, Circumferential  Lower Leg Wound Status: Open Wounding Event: Gradually Appeared Date Acquired: 03/02/2019 Weeks Of Treatment: 3 Clustered Wound: No Wound Measurements Length: (cm) 11 Width: (cm)  28 Depth: (cm) 0.1 Area: (cm) 241.903 Volume: (cm) 24.19 % Reduction in Area: 12.3% % Reduction in Volume: 12.3% Wound Description Classification: Partial Thickness Treatment Notes Wound #6 (Left, Circumferential Lower Leg) Notes Silver alginate, BFD gluteus. Page Spiro, 3 Layer right leg Electronic Signature(s) Signed: 04/25/2019 11:52:26 AM By: Army Melia Entered By: Army Melia on 04/24/2019 13:36:06 Steven Meza ([id number]) -------------------------------------------------------------------------------- Wound Assessment Details Patient Name: Steven Meza Date of Service: 04/24/2019 1:30 PM Medical Record Number: [id number] Patient Account Number: [id number] Date of Birth/Sex: [birth year] (85 y.o. M) Treating RN: Army Melia Primary Care Persais Ethridge: Fulton Reek Other Clinician: Referring Festus Pursel: Fulton Reek Treating Morse Brueggemann/Extender: Melburn Hake, HOYT Weeks in Treatment: 3 Wound Status Wound Number: 7 Primary Pressure Ulcer Etiology: Wound Location: Left Gluteus Wound Status: Open Wounding Event: Pressure Injury Comorbid Lymphedema, Coronary Artery Disease, Date Acquired: 04/02/2019 History: Hypertension Weeks Of Treatment: 1 Clustered Wound: No Photos Wound Measurements Length: (cm) 0.4 % Reduction Width: (cm) 0.5 % Reduction Depth: (cm) 0.1 Epithelializ Area: (cm) 0.157 Tunneling: Volume: (cm) 0.016 Undermining in Area: 60.1% in Volume: 59% ation: Small (1-33%) No : No Wound Description Classification: Category/Stage II Foul Odor A Wound Margin: Indistinct, nonvisible Slough/Fibr Exudate Amount: Medium Exudate Type: Serous Exudate Color: amber fter Cleansing: No ino No Wound Bed Granulation Amount: Medium (34-66%) Exposed Structure Granulation Quality: Red Fascia Exposed: No Necrotic Amount: Small (1-33%) Fat Layer (Subcutaneous Tissue) Exposed: Yes Necrotic Quality: Adherent Slough Tendon Exposed: No Muscle Exposed: No Joint  Exposed: No Bone Exposed: No Treatment Notes KAIDE, KLEBE ([id number]) Wound #7 (Left Gluteus) Notes Silver alginate, BFD gluteus. Page Spiro, 3 Layer right leg Electronic Signature(s) Signed: 04/25/2019 11:52:26 AM By: Army Melia Entered By: Army Melia on 04/24/2019 13:41:23 Steven Meza ([id number]) -------------------------------------------------------------------------------- Vitals Details Patient Name: Steven Meza Date of Service: 04/24/2019 1:30 PM Medical Record Number: [id number] Patient Account Number: [id number] Date of Birth/Sex: 1931-02-16 (85 y.o. M) Treating RN: Montey Hora Primary Care Yatzari Jonsson: Fulton Reek Other Clinician: Referring Wenceslaus Gist: Fulton Reek Treating Arshad Oberholzer/Extender: Melburn Hake, HOYT Weeks in Treatment: 3 Vital Signs Time Taken: 13:28 Temperature (F): 98.5 Height (in): 65 Pulse (bpm): 89 Weight (lbs): 273 Respiratory Rate (breaths/min): 18 Body Mass Index (BMI): 45.4 Blood Pressure (mmHg): 139/54 Reference Range: 80 - 120 mg / dl Electronic Signature(s) Signed: 04/24/2019 3:55:13 PM By: Lorine Bears RCP, RRT, CHT Entered By: Lorine Bears on 04/24/2019 13:32:24

## 2019-04-30 ENCOUNTER — Encounter: Payer: Medicare Other | Admitting: Internal Medicine

## 2019-04-30 ENCOUNTER — Other Ambulatory Visit: Payer: Self-pay

## 2019-04-30 DIAGNOSIS — L97822 Non-pressure chronic ulcer of other part of left lower leg with fat layer exposed: Secondary | ICD-10-CM | POA: Diagnosis not present

## 2019-04-30 NOTE — Progress Notes (Signed)
JAHIR, KOSSMAN ([id number]) Visit Report for 04/30/2019 Arrival Information Details Patient Name: Steven Meza, Steven Meza Date of Service: 04/30/2019 1:45 PM Medical Record Number: [id number] Patient Account Number: [id number] Date of Birth/Sex: 02/13/1931 (85 y.o. M) Treating RN: Harold Barban Primary Care Kamoria Lucien: Fulton Reek Other Clinician: Referring Aylana Hirschfeld: Fulton Reek Treating Amnah Breuer/Extender: Beverly Gust in Treatment: 4 Visit Information History Since Last Visit Added or deleted any medications: No Patient Arrived: Walker Any new allergies or adverse reactions: No Arrival Time: 13:34 Had a fall or experienced change in No Accompanied By: wife activities of daily living that may affect Transfer Assistance: None risk of falls: Patient Identification Verified: Yes Signs or symptoms of abuse/neglect since last visito No Secondary Verification Process Completed: Yes Hospitalized since last visit: No Implantable device outside of the clinic excluding No cellular tissue based products placed in the center since last visit: Has Dressing in Place as Prescribed: Yes Pain Present Now: No Electronic Signature(s) Signed: 04/30/2019 4:12:23 PM By: Lorine Bears RCP, RRT, CHT Entered By: Lorine Bears on 04/30/2019 13:35:28 Steven Meza ([id number]) -------------------------------------------------------------------------------- Clinic Level of Care Assessment Details Patient Name: Steven Meza Date of Service: 04/30/2019 1:45 PM Medical Record Number: [id number] Patient Account Number: [id number] Date of Birth/Sex: 1930/08/01 (85 y.o. M) Treating RN: Harold Barban Primary Care Erion Weightman: Fulton Reek Other Clinician: Referring Eldene Plocher: Fulton Reek Treating Jencarlos Nicolson/Extender: Beverly Gust in Treatment: 4 Clinic Level of Care Assessment Items TOOL 4 Quantity Score []  - Use when only an EandM is performed on  FOLLOW-UP visit 0 ASSESSMENTS - Nursing Assessment / Reassessment X - Reassessment of Co-morbidities (includes updates in patient status) 1 10 X- 1 5 Reassessment of Adherence to Treatment Plan ASSESSMENTS - Wound and Skin Assessment / Reassessment []  - Simple Wound Assessment / Reassessment - one wound 0 X- 2 5 Complex Wound Assessment / Reassessment - multiple wounds []  - 0 Dermatologic / Skin Assessment (not related to wound area) ASSESSMENTS - Focused Assessment []  - Circumferential Edema Measurements - multi extremities 0 []  - 0 Nutritional Assessment / Counseling / Intervention []  - 0 Lower Extremity Assessment (monofilament, tuning fork, pulses) []  - 0 Peripheral Arterial Disease Assessment (using hand held doppler) ASSESSMENTS - Ostomy and/or Continence Assessment and Care []  - Incontinence Assessment and Management 0 []  - 0 Ostomy Care Assessment and Management (repouching, etc.) PROCESS - Coordination of Care X - Simple Patient / Family Education for ongoing care 1 15 []  - 0 Complex (extensive) Patient / Family Education for ongoing care []  - 0 Staff obtains Programmer, systems, Records, Test Results / Process Orders X- 1 10 Staff telephones HHA, Nursing Homes / Clarify orders / etc []  - 0 Routine Transfer to another Facility (non-emergent condition) []  - 0 Routine Hospital Admission (non-emergent condition) []  - 0 New Admissions / Biomedical engineer / Ordering NPWT, Apligraf, etc. []  - 0 Emergency Hospital Admission (emergent condition) X- 1 10 Simple Discharge Coordination ERI, SCHU ([id number]) []  - 0 Complex (extensive) Discharge Coordination PROCESS - Special Needs []  - Pediatric / Minor Patient Management 0 []  - 0 Isolation Patient Management []  - 0 Hearing / Language / Visual special needs []  - 0 Assessment of Community assistance (transportation, D/C planning, etc.) []  - 0 Additional assistance / Altered mentation []  - 0 Support Surface(s)  Assessment (bed, cushion, seat, etc.) INTERVENTIONS - Wound Cleansing / Measurement []  - Simple Wound Cleansing - one wound 0 X- 2 5 Complex Wound Cleansing - multiple wounds X- 1 5 Wound  Imaging (photographs - any number of wounds) []  - 0 Wound Tracing (instead of photographs) []  - 0 Simple Wound Measurement - one wound X- 2 5 Complex Wound Measurement - multiple wounds INTERVENTIONS - Wound Dressings X - Small Wound Dressing one or multiple wounds 2 10 []  - 0 Medium Wound Dressing one or multiple wounds []  - 0 Large Wound Dressing one or multiple wounds []  - 0 Application of Medications - topical []  - 0 Application of Medications - injection INTERVENTIONS - Miscellaneous []  - External ear exam 0 []  - 0 Specimen Collection (cultures, biopsies, blood, body fluids, etc.) []  - 0 Specimen(s) / Culture(s) sent or taken to Lab for analysis []  - 0 Patient Transfer (multiple staff / Civil Service fast streamer / Similar devices) []  - 0 Simple Staple / Suture removal (25 or less) []  - 0 Complex Staple / Suture removal (26 or more) []  - 0 Hypo / Hyperglycemic Management (close monitor of Blood Glucose) []  - 0 Ankle / Brachial Index (ABI) - do not check if billed separately X- 1 5 Vital Signs RAVINDRA, ALTSCHULER ([id number]) Has the patient been seen at the hospital within the last three years: Yes Total Score: 110 Level Of Care: New/Established - Level 3 Electronic Signature(s) Signed: 04/30/2019 4:23:20 PM By: Harold Barban Entered By: Harold Barban on 04/30/2019 13:57:27 Steven Meza ([id number]) -------------------------------------------------------------------------------- Encounter Discharge Information Details Patient Name: Steven Meza Date of Service: 04/30/2019 1:45 PM Medical Record Number: [id number] Patient Account Number: [id number] Date of Birth/Sex: 06/18/1930 (85 y.o. M) Treating RN: Harold Barban Primary Care Hasan Douse: Fulton Reek Other  Clinician: Referring Antavius Sperbeck: Fulton Reek Treating Cliford Sequeira/Extender: Beverly Gust in Treatment: 4 Encounter Discharge Information Items Discharge Condition: Stable Ambulatory Status: Walker Discharge Destination: Home Transportation: Private Auto Accompanied By: spouse Schedule Follow-up Appointment: Yes Clinical Summary of Care: Electronic Signature(s) Signed: 04/30/2019 4:23:20 PM By: Harold Barban Entered By: Harold Barban on 04/30/2019 14:06:22 Steven Meza ([id number]) -------------------------------------------------------------------------------- Lower Extremity Assessment Details Patient Name: Steven Meza Date of Service: 04/30/2019 1:45 PM Medical Record Number: [id number] Patient Account Number: [id number] Date of Birth/Sex: Mar 12, 1931 (85 y.o. M) Treating RN: Montey Hora Primary Care Persephone Schriever: Fulton Reek Other Clinician: Referring Dardan Shelton: Fulton Reek Treating Samiksha Pellicano/Extender: Beverly Gust in Treatment: 4 Edema Assessment Assessed: Shirlyn Goltz: No] Patrice Paradise: No] Edema: [Left: Ye] [Right: s] Calf Left: Right: Point of Measurement: 33 cm From Medial Instep 46.5 cm cm Ankle Left: Right: Point of Measurement: 12 cm From Medial Instep 28 cm cm Vascular Assessment Pulses: Dorsalis Pedis Palpable: [Left:Yes] Electronic Signature(s) Signed: 04/30/2019 4:30:26 PM By: Montey Hora Entered By: Montey Hora on 04/30/2019 13:44:26 Steven Meza ([id number]) -------------------------------------------------------------------------------- Multi Wound Chart Details Patient Name: Steven Meza Date of Service: 04/30/2019 1:45 PM Medical Record Number: [id number] Patient Account Number: [id number] Date of Birth/Sex: [birth year] (85 y.o. M) Treating RN: Harold Barban Primary Care Kamyra Schroeck: Fulton Reek Other Clinician: Referring Diany Formosa: Fulton Reek Treating Dennard Vezina/Extender: Beverly Gust in Treatment:  4 Vital Signs Height(in): 65 Pulse(bpm): 15 Weight(lbs): 273 Blood Pressure(mmHg): 146/43 Body Mass Index(BMI): 45 Temperature(F): 97.5 Respiratory Rate 18 (breaths/min): Photos: [7:No Photos] [N/A:N/A] Wound Location: Left Lower Leg - Left Gluteus N/A Circumferential Wounding Event: Gradually Appeared Pressure Injury N/A Primary Etiology: Venous Leg Ulcer Pressure Ulcer N/A Comorbid History: Lymphedema, Coronary Artery Lymphedema, Coronary Artery N/A Disease, Hypertension Disease, Hypertension Date Acquired: 03/02/2019 04/02/2019 N/A Weeks of Treatment: 4 2 N/A Wound Status: Open Open N/A Measurements L x W x D 11x28x0.1 0.4x0.5x0.1 N/A (cm) Area (  cm) : 241.903 0.157 N/A Volume (cm) : 24.19 0.016 N/A % Reduction in Area: 12.30% 60.10% N/A % Reduction in Volume: 12.30% 59.00% N/A Classification: Partial Thickness Category/Stage II N/A Exudate Amount: Large Medium N/A Exudate Type: Serous Serous N/A Exudate Color: amber amber N/A Wound Margin: Indistinct, nonvisible Indistinct, nonvisible N/A [name], WINTERBOTTOM ([id number]) Granulation Amount: Large (67-100%) Medium (34-66%) N/A Granulation Quality: Pink Red N/A Necrotic Amount: Small (1-33%) Small (1-33%) N/A Exposed Structures: Fat Layer (Subcutaneous Fat Layer (Subcutaneous N/A Tissue) Exposed: Yes Tissue) Exposed: Yes Fascia: No Fascia: No Tendon: No Tendon: No Muscle: No Muscle: No Joint: No Joint: No Bone: No Bone: No Epithelialization: Small (1-33%) Small (1-33%) N/A Treatment Notes Electronic Signature(s) Signed: 04/30/2019 4:23:20 PM By: Harold Barban Entered By: Harold Barban on 04/30/2019 13:56:13 Steven Meza ([id number]) -------------------------------------------------------------------------------- Multi-Disciplinary Care Plan Details Patient Name: Steven Meza Date of Service: 04/30/2019 1:45 PM Medical Record Number: [id number] Patient Account Number: [id number] Date of  Birth/Sex: 1930-10-28 (85 y.o. M) Treating RN: Harold Barban Primary Care Lyndell Gillyard: Fulton Reek Other Clinician: Referring Nery Frappier: Fulton Reek Treating Haward Pope/Extender: Beverly Gust in Treatment: 4 Active Inactive Abuse / Safety / Falls / Self Care Management Nursing Diagnoses: Potential for falls Goals: Patient will remain injury free related to falls Date Initiated: 04/24/2019 Target Resolution Date: 07/14/2019 Goal Status: Active Interventions: Assess fall risk on admission and as needed Notes: Venous Leg Ulcer Nursing Diagnoses: Actual venous Insuffiency (use after diagnosis is confirmed) Goals: Patient will maintain optimal edema control Date Initiated: 04/24/2019 Target Resolution Date: 07/14/2019 Goal Status: Active Interventions: Compression as ordered Notes: Electronic Signature(s) Signed: 04/30/2019 4:23:20 PM By: Harold Barban Entered By: Harold Barban on 04/30/2019 13:55:56 Steven Meza ([id number]) -------------------------------------------------------------------------------- Pain Assessment Details Patient Name: Steven Meza Date of Service: 04/30/2019 1:45 PM Medical Record Number: [id number] Patient Account Number: [id number] Date of Birth/Sex: April 07, 1931 (85 y.o. M) Treating RN: Harold Barban Primary Care Amauri Keefe: Fulton Reek Other Clinician: Referring Margareth Kanner: Fulton Reek Treating Kazim Corrales/Extender: Beverly Gust in Treatment: 4 Active Problems Location of Pain Severity and Description of Pain Patient Has Paino No Site Locations Pain Management and Medication Current Pain Management: Electronic Signature(s) Signed: 04/30/2019 4:12:23 PM By: Lorine Bears RCP, RRT, CHT Signed: 04/30/2019 4:23:20 PM By: Harold Barban Entered By: Lorine Bears on 04/30/2019 13:35:36 Steven Meza  ([id number]) -------------------------------------------------------------------------------- Patient/Caregiver Education Details Patient Name: Steven Meza Date of Service: 04/30/2019 1:45 PM Medical Record Number: [id number] Patient Account Number: [id number] Date of Birth/Gender: 08/02/1930 (85 y.o. M) Treating RN: Harold Barban Primary Care Physician: Fulton Reek Other Clinician: Referring Physician: Fulton Reek Treating Physician/Extender: Beverly Gust in Treatment: 4 Education Assessment Education Provided To: Patient Education Topics Provided Wound/Skin Impairment: Handouts: Caring for Your Ulcer Methods: Demonstration Responses: State content correctly Electronic Signature(s) Signed: 04/30/2019 4:23:20 PM By: Harold Barban Entered By: Harold Barban on 04/30/2019 13:56:34 Steven Meza ([id number]) -------------------------------------------------------------------------------- Wound Assessment Details Patient Name: Steven Meza Date of Service: 04/30/2019 1:45 PM Medical Record Number: [id number] Patient Account Number: [id number] Date of Birth/Sex: 08/14/1930 (85 y.o. M) Treating RN: Montey Hora Primary Care Tamario Heal: Fulton Reek Other Clinician: Referring Jammie Troup: Fulton Reek Treating Aliscia Clayton/Extender: Beverly Gust in Treatment: 4 Wound Status Wound Number: 6 Primary Venous Leg Ulcer Etiology: Wound Location: Left Lower Leg - Circumferential Wound Status: Open Wounding Event: Gradually Appeared Comorbid Lymphedema, Coronary Artery Disease, Date Acquired: 03/02/2019 History: Hypertension Weeks Of Treatment: 4 Clustered Wound: No Photos Wound Measurements Length: (cm) 11 Width: (cm)  28 Depth: (cm) 0.1 Area: (cm) 241.903 Volume: (cm) 24.19 % Reduction in Area: 12.3% % Reduction in Volume: 12.3% Epithelialization: Small (1-33%) Tunneling: No Undermining: No Wound Description Classification: Partial  Thickness Wound Margin: Indistinct, nonvisible Exudate Amount: Large Exudate Type: Serous Exudate Color: amber Foul Odor After Cleansing: No Slough/Fibrino Yes Wound Bed Granulation Amount: Large (67-100%) Exposed Structure Granulation Quality: Pink Fascia Exposed: No Necrotic Amount: Small (1-33%) Fat Layer (Subcutaneous Tissue) Exposed: Yes Necrotic Quality: Adherent Slough Tendon Exposed: No Muscle Exposed: No Joint Exposed: No Bone Exposed: No Treatment Notes EARNESTINE, ZUKER ([id number]) Wound #6 (Left, Circumferential Lower Leg) Notes Xtrasorb, 3 Layer right leg Electronic Signature(s) Signed: 04/30/2019 4:30:26 PM By: Montey Hora Entered By: Montey Hora on 04/30/2019 13:46:33 Steven Meza ([id number]) -------------------------------------------------------------------------------- Wound Assessment Details Patient Name: Steven Meza Date of Service: 04/30/2019 1:45 PM Medical Record Number: [id number] Patient Account Number: [id number] Date of Birth/Sex: 01-Mar-1931 (85 y.o. M) Treating RN: Montey Hora Primary Care Lokelani Lutes: Fulton Reek Other Clinician: Referring Ranata Laughery: Fulton Reek Treating Sadia Belfiore/Extender: Beverly Gust in Treatment: 4 Wound Status Wound Number: 7 Primary Pressure Ulcer Etiology: Wound Location: Left Gluteus Wound Status: Healed - Epithelialized Wounding Event: Pressure Injury Comorbid Lymphedema, Coronary Artery Disease, Date Acquired: 04/02/2019 History: Hypertension Weeks Of Treatment: 2 Clustered Wound: No Photos Photo Uploaded By: Montey Hora on 04/30/2019 14:02:48 Wound Measurements Length: (cm) 0 % Redu Width: (cm) 0 % Redu Depth: (cm) 0 Epithe Area: (cm) 0 [place] Volume: (cm) 0 Under ction in Area: 100% ction in Volume: 100% lialization: Small (1-33%) ling: No mining: No Wound Description Classification: Category/Stage II Wound Margin: Indistinct, nonvisible Exudate Amount:  Medium Exudate Type: Serous Exudate Color: amber Foul Odor After Cleansing: No Slough/Fibrino No Wound Bed Granulation Amount: Medium (34-66%) Exposed Structure Granulation Quality: Red Fascia Exposed: No Necrotic Amount: Small (1-33%) Fat Layer (Subcutaneous Tissue) Exposed: Yes Necrotic Quality: Adherent Slough Tendon Exposed: No Muscle Exposed: No Joint Exposed: No Bone Exposed: No DAUD, BLASINGAME ([id number]) Electronic Signature(s) Signed: 04/30/2019 4:30:26 PM By: Montey Hora Entered By: Montey Hora on 04/30/2019 14:02:03 Steven Meza ([id number]) -------------------------------------------------------------------------------- Vitals Details Patient Name: Steven Meza Date of Service: 04/30/2019 1:45 PM Medical Record Number: [id number] Patient Account Number: [id number] Date of Birth/Sex: Aug 15, 1930 (85 y.o. M) Treating RN: Harold Barban Primary Care Valeri Sula: Fulton Reek Other Clinician: Referring Edmonia Gonser: Fulton Reek Treating Markie Frith/Extender: Beverly Gust in Treatment: 4 Vital Signs Time Taken: 13:35 Temperature (F): 97.5 Height (in): 65 Pulse (bpm): 83 Weight (lbs): 273 Respiratory Rate (breaths/min): 18 Body Mass Index (BMI): 45.4 Blood Pressure (mmHg): 146/43 Reference Range: 80 - 120 mg / dl Electronic Signature(s) Signed: 04/30/2019 4:12:23 PM By: Lorine Bears RCP, RRT, CHT Entered By: Lorine Bears on 04/30/2019 13:40:34

## 2019-05-01 NOTE — Progress Notes (Addendum)
BRELIN, JOUNG ([id number]) Visit Report for 04/30/2019 HPI Details Patient Name: Steven Meza, Steven Meza Date of Service: 04/30/2019 1:45 PM Medical Record Number: [id number] Patient Account Number: [id number] Date of Birth/Sex: [birth year] (85 y.o. M) Treating RN: Harold Barban Primary Care Provider: Fulton Reek Other Clinician: Referring Provider: Fulton Reek Treating Provider/Extender: Beverly Gust in Treatment: 4 History of Present Illness HPI Description: ADMISSION 05/17/2018 Steven Meza is an 85 year old man who is been treated at the lymphedema clinic for a long period with lymphedema wraps for bilateral lower extremity lymphedema. About 6 months ago they noted small open areas on his left lateral calf just above the ankle. These were open. They are apparently putting some form of ointment on this. They have been referred here for our review of this. The patient has a long history of lymphedema and venous stasis. It says in his records that he has recurrent cellulitis although his wife denies this but she states that the lymphedema may have started with cellulitis several years ago. Also in his record there is a history of bladder cancer which they seem to know very little about however he did not have any radiation to the pelvis or anything that could have contributed to lymphedema that they are aware of. There is no prior wound history. The patient has 2 small punched out areas on the left lateral calf that have adherent debris on the surface. Past medical history; lymphedema, hypertension, cellulitis, hearing loss, morbid obesity, osteoarthritis, venous stasis, bladder CA, diverticulosis. ABIs in our clinic were 0.36 on the right and 0.57 on the left 05/24/2018; corrected age on this patient is 72 versus what I said last week. He has been for his arterial studies which predictably are not very good. On the right his ABI is 0.65. Monophasic waveforms noted at the right  ankle including the posterior tibial and dorsalis pedis. He has triphasic waveforms of the common femoral and profunda femoris triphasic proximal SFA with monophasic distal FSA and popliteal artery. Monophasic tibial waveforms. On the left his ABI is 0.58 monophasic waveforms at the left ankle. Duplex of the left lower extremity demonstrated atherosclerotic change with monophasic waveforms throughout the left lower extremity. There was some concern about left iliac disease and potentially femoral-popliteal and tibial disease. The patient does not describe claudication although according to his wife he over emphasizes his activity. Patient states he is limited by pain in both knees His wounds are on the left lateral calf. 2 small areas with necrotic debris. We have been using silver collagen and light Ace wraps 05/31/2018; wounds on the left lateral calf in the setting of very significant lymphedema and probably PAD. We have been using silver collagen. The base of the small wound looks reasonably improved. I sent him to see Dr. Fletcher Anon however I see now he has an appointment with Dr. Alvester Chou on February 12 2/5; left lateral calf wound looks the same. His wife is doing a good job with her lymphedema wraps and maintaining that the edema. He most likely has PAD and is due to see Dr. Gwenlyn Found of infectious disease on February 12 2/19; left lateral calf wounds look about the same. This looks like wound secondary to chronic venous insufficiency with lymphedema however the patient has very poor arterial studies. We referred him to Dr. Gwenlyn Found. Dr. Gwenlyn Found did not feel he needed to do anything from an arterial point of view. He did not address the question about the aggressiveness of compression. After some thoughts about this  I elected to go ahead and put him in 3 layer compression which after all would be less compression then the lymphedema clinic was putting on this. I am hopeful that this should be enough to get  some closure of the small wounds 2/26; left lateral calf wound letter this week. He tolerated the 3 layer compression well furthermore he is sleeping in a hospital bed which helps keep his legs up at night. The wound looks better measuring smaller especially in width 3/3; left lateral calf wound about the same size. The 3 layer compression has really helped get the edema down in his leg Steven Meza, Steven A. ([id number]) however. Using silver collagen 3/11; left lateral calf wound better looking wound surface but about the same size. We have been using 3 layer compression which is really helped get the edema down in the left leg. Even after Dr. Kennon Holter assessment I am reluctant to go to 4 layer compression. He is tolerating 3 layer compression well. We have been using silver collagen 3/18-Patient returns for his left lateral calf wound which overall is looking better, slightly increased in size and dimensions and area, he is tolerating the 3 layer compression, has been dressed with silver collagen which we will continue 3/25oleft lateral calf wound much better looking. Size is smaller. He has been using silver collagen 4/1; gradually getting smaller in size. Using silver collagen with 3 layer compression. I think the patient has some degree of PAD. He saw Dr. Gwenlyn Found in consultation, he did not specifically comment on whether he could tolerate 4 layer compression 4/8; the wound is slightly smaller in depth. We are using 3 layer compression with silver collagen. I had him seen for arterial insufficiency however his ABI was 0.57 in the left leg. Dr. Gwenlyn Found really did not seem to middle on the degree of compression he could tolerate 4/15; the wound is slightly smaller and slightly less deep. We have been using 3 layer compression with silver collagen. There is not an option for 4-layer compression here. 4/22; the patient is making decent progress on his wound on the left lateral calf however he arrives in  with a superficial wound on the right anterior tibial area. Were not really sure how this happened. He had been using a stocking on the right leg. He has been using silver collagen on the wounds Amazing to me he is he appears to have compression pumps at home. I am not really sure I knew this before. In any case he has not been using them 4/29; left lateral calf wound continues to get gradually smaller. This looks like it is on its way to closure. oThe area on the right anterior tibial area is about the same in terms of size superficial without any depth. This was probably wrap injury oHe tells me that he has been using his compression pumps once a day for 1 hour without any pain 5/6; not as good today. Left lateral calf wound is actually larger and he does not have quite as good edema control. The area on the right anterior tibia is about the same still superficial. He states he is using his compression pumps once a day. We are using 3 layer compression on the left and Curlex Coban on the right 5/13; the area on the right is very close to being healed. The area on the left still has some depth but has a healthy wound surface. He has excellent edema control bilaterally. Using Hydrofera Blue 5/20-Patient returns in  1 week after being in 3 layer compression on the left, the left lateral calf ulcer is stable with some slough, the right anterior tibial ulcer is healed we have been using Kerlix Coban on that leg 5/27; Hydrofera Blue and 3 layer compression. Still some slough in the wound area and some nonviable edges around the wound. 6/3; this patient has lymphedema and chronic venous insufficiency. He had difficult to close wound on his left lateral calf. This is closed today. 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 Finally he has known PAD but he is managed to tolerate the compression we reporting on him and also using his compression pumps that he already  had at home twice a day 6/10; we discharge this patient last week having finally close the small area in the left lateral calf. Apparently by Friday of last week this had opened and was draining. He is therefore back in clinic. His wife who is present also was concerned about an area on the right anterior tibia."o 6/17; the patient does not have an open area on the right leg. On the left he has a small circular area anteriorly and the same area laterally that he had last time. However both of these appear to be improved he has his Farrow wrap 30/40. It turns out that he is only using his external compression pumps once a day. I tried to get him up to twice a day today 6/24; the patient comes in with his external compression garment on the right leg/Farrow wrap. As far as we know no open wounds here. He has 2 areas that are smaller one on the left anterior pretibial and one on the left lateral calf which is the site of his initial wounds. I have successfully caught him into his external compression pumps twice a day 7/1; still using his external compression garment on the right leg. As far as we know there are no open wounds here. The area on the left lateral calf which was the site of his initial wound has closed over. The area on the left anterior pretibial that we identified last week is still open. 7/8; as far as we know he has no open wounds on the right leg. He has his external compression garment on here. The original wound on the left lateral calf has closed over. He developed an area on the left anterior pretibial 2 weeks ago and seems to have 2 smaller areas on the proximal and distal lateral calf. We do not have his good edema control this week as I am used to seeing 7/15; the patient has still a linear area on the left anterior tibia. The area on the left lateral leg looked somewhat inflamed today but no cellulitis. He did have what looked to be a small pustule that I cultured for CandS  although I did not give him empiric antibiotics. 7/22; small area on the left anterior tibia. The area on the left lateral leg still looks inflamed. The pustule that I cultured last week grew Enterobacter and Pseudomonas. I prescribed Cipro they still have not picked that up. In any case the area actually looks fairly satisfactory. He has decent edema control using his compression pumps twice a day Steven Meza, Steven Meza ([id number]) 7/29; the area on the left lateral leg looks a lot better than last week. Nothing is open here. He has an area just medial to the mid anterior tibia that is still open that is  only open wound today. 8/5-Patient returns at 1 week for the left leg ulcer, which is actually looking better, he is also complaining of left plantar foot pain below the great toe. He does have a hematoma here 8/12-Patient returns at 1 week, the left leg open area looks about the same if not slightly better, the left plantar hematoma under the great toe is about the same, patient apparently has not been resting his foot on anything for too long period of time and does not wear the [employer] shoes unless he walks outdoors which he does not do often 8/19; small wound on the left anterior lower leg this is just about closed there is nothing on the left lateral leg. He is using his compression pumps He arrives in clinic today with a black eschar over the right first plantar metatarsal head. This was apparently a bruise that was identified by her nurses last week. He wears crocs on his feet. He also may rub these on a footboard at home 8/26; there is nothing open on the left lateral lower leg he has a small area just medial to the tibia. He is going to see his podiatrist Dr. Milinda Pointer about the area on the left plantar first metatarsal head. He is using his external compression pumps. He has a juxta lite on the right leg 9/2; there is nothing open on either side of her left lower leg. He went to see  podiatry about the area on the left first metatarsal head. This was apparently debrided. He was given mupirocin and oral antibiotics. I do not believe he is offloading this at all. Readmission: 04/02/2019 upon evaluation today patient actually appears for reevaluation here in our clinic secondary to issues that he is having with his left lower extremity. He has been tolerating the dressing changes without complication. Fortunately there is no signs of active infection at this time. No fevers, chills, nausea, vomiting, or diarrhea. He has been in the hospital since we last saw him and then subsequently transferred back to [facility]. He tells me that they have been changing his dressing once a day and that he has had a significant amount of pain secondary to undergoing these dressing changes. Fortunately there is no evidence of active infection at this point which is good news. No fevers, chills, nausea, vomiting, or diarrhea. With that being said he wonders if we can go back to doing what we were doing before which was when he actually had the dressing changes performed once a week here in the clinic he tells me that he did very well with that. At that point he tolerated a 3 layer compression wrap which was approved by vascular, Dr. Tyrell Antonio office, to be used previous although 4-layer compression wrap is probably too much for him. 04/16/2019 on evaluation today patient appears to be doing decently well with regard to his lower extremity although he has had a lot of drainage from his leg. A lot of this I think is just secondary to the lymphedema with that being said he does have some odor as well which may again just be due to more of the excessive moisture although I think that he may also have something that is a result of bacteria again he has somewhat of a Pseudomonas-like smell to the drainage and fortunately though he is not showing any signs of severe cellulitis he is still experiencing  quite a bit of drainage and the discoloration is somewhat of a yellow/green in color  which is consistent with Pseudomonas. If he can tolerated I think he may do well with Cipro. 04/24/2019 on evaluation today patient appears to be doing well with regard to his wound in the gluteal region which is healing quite well. That is excellent news. With that being said he is also doing better with regard to his left lower extremity overall I feel like things are definitely showing signs of improvement. 12/28-Patient returns with regards to his gluteal wound that is improving, Left lower leg wounds with significant edema in the lower legs that appear to be persistent. Patient is in compression and has lymphedema pumps at home but apparently he does not use consistently Electronic Signature(s) Signed: 05/01/2019 2:59:03 PM By: Tobi Bastos MD, MBA Previous Signature: 05/01/2019 2:56:55 PM Version By: Tobi Bastos MD, MBA Previous Signature: 05/01/2019 2:44:04 PM Version By: Tobi Bastos MD, MBA Entered By: Tobi Bastos on 05/01/2019 14:59:02 Steven Meza ([id number]) -------------------------------------------------------------------------------- Physical Exam Details Patient Name: Steven Meza Date of Service: 04/30/2019 1:45 PM Medical Record Number: [id number] Patient Account Number: [id number] Date of Birth/Sex: [birth year] (85 y.o. M) Treating RN: Harold Barban Primary Care Provider: Fulton Reek Other Clinician: Referring Provider: Fulton Reek Treating Provider/Extender: Beverly Gust in Treatment: 4 Constitutional alert and oriented x 3. sitting or standing blood pressure is within target range for patient.. supine blood pressure is within target range for patient.. pulse regular and within target range for patient.Marland Kitchen respirations regular, non-labored and within target range for patient.Marland Kitchen temperature within target range for patient.. . . Well-nourished and  well-hydrated in no acute distress. Notes Bilateral lower extremity edema with lower extremity wounds on Left sides, these are superficial and associated with scaling and activity drainage around with significant degree of maceration Gluteal wound appears to be stable with intact surrounding skin Electronic Signature(s) Signed: 05/01/2019 2:58:39 PM By: Tobi Bastos MD, MBA Previous Signature: 05/01/2019 2:57:43 PM Version By: Tobi Bastos MD, MBA Entered By: Tobi Bastos on 05/01/2019 14:58:38 Steven Meza ([id number]) -------------------------------------------------------------------------------- Physician Orders Details Patient Name: Steven Meza Date of Service: 04/30/2019 1:45 PM Medical Record Number: [id number] Patient Account Number: [id number] Date of Birth/Sex: 1931/01/31 (85 y.o. M) Treating RN: Harold Barban Primary Care Provider: Fulton Reek Other Clinician: Referring Provider: Fulton Reek Treating Provider/Extender: Beverly Gust in Treatment: 4 Verbal / Phone Orders: No Diagnosis Coding Wound Cleansing Wound #6 Left,Circumferential Lower Leg o May shower with protection. o No tub bath. Primary Wound Dressing Wound #6 Left,Circumferential Lower Leg o XtraSorb Dressing Change Frequency Wound #6 Left,Circumferential Lower Leg o Change Dressing Monday, Wednesday, Friday - Home Health to change dressings on Wednesdays and Fridays Follow-up Appointments Wound #6 Left,Circumferential Lower Leg o Return Appointment in 1 week. o Nurse Visit as needed Edema Control Wound #6 Left,Circumferential Lower Leg o 3 Layer Compression System - Left Lower Extremity Off-Loading Wound #6 Left,Circumferential Lower Leg o Turn and reposition every 2 hours Home Health Wound #6 Left,Circumferential Lower Leg o [place] Nurse may visit PRN to address patientos wound care needs. o FACE TO FACE  ENCOUNTER: MEDICARE and MEDICAID PATIENTS: I certify that this patient is under my care and that I had a face-to-face encounter that meets the physician face-to-face encounter requirements with this patient on this date. The encounter with the patient was in whole or in part for the following MEDICAL CONDITION: (primary reason for [place]) MEDICAL NECESSITY: I certify, that based on my findings, NURSING services are a medically necessary  home health service. HOME BOUND STATUS: I certify that my clinical findings support that this patient is homebound (i.e., Due to illness or injury, pt requires aid of supportive devices such as crutches, cane, wheelchairs, walkers, the use of special transportation or the assistance of another person to leave their place of residence. There is a normal inability to leave the home and doing so requires considerable and taxing effort. Other absences are for medical reasons / religious services and are infrequent or of short duration when for other reasons). NEIMAR, FICCA ([id number]) o If current dressing causes regression in wound condition, may D/C ordered dressing product/s and apply Normal Saline Moist Dressing daily until next [place] / Other MD appointment. [place] of regression in wound condition at [phone]. o Please direct any NON-WOUND related issues/requests for orders to patient's Primary Care Physician Electronic Signature(s) Signed: 04/30/2019 4:23:20 PM By: Harold Barban Signed: 04/30/2019 4:26:33 PM By: Tobi Bastos MD, MBA Entered By: Harold Barban on 04/30/2019 14:04:13 Steven Meza ([id number]) -------------------------------------------------------------------------------- Progress Note Details Patient Name: Steven Meza Date of Service: 04/30/2019 1:45 PM Medical Record Number: [id number] Patient Account Number: [id number] Date of Birth/Sex: 11-Jul-1930 (85 y.o. M) Treating  RN: Harold Barban Primary Care Provider: Fulton Reek Other Clinician: Referring Provider: Fulton Reek Treating Provider/Extender: Beverly Gust in Treatment: 4 Subjective History of Present Illness (HPI) ADMISSION 05/17/2018 Mr. Bollig is an 85 year old man who is been treated at the lymphedema clinic for a long period with lymphedema wraps for bilateral lower extremity lymphedema. About 6 months ago they noted small open areas on his left lateral calf just above the ankle. These were open. They are apparently putting some form of ointment on this. They have been referred here for our review of this. The patient has a long history of lymphedema and venous stasis. It says in his records that he has recurrent cellulitis although his wife denies this but she states that the lymphedema may have started with cellulitis several years ago. Also in his record there is a history of bladder cancer which they seem to know very little about however he did not have any radiation to the pelvis or anything that could have contributed to lymphedema that they are aware of. There is no prior wound history. The patient has 2 small punched out areas on the left lateral calf that have adherent debris on the surface. Past medical history; lymphedema, hypertension, cellulitis, hearing loss, morbid obesity, osteoarthritis, venous stasis, bladder CA, diverticulosis. ABIs in our clinic were 0.36 on the right and 0.57 on the left 05/24/2018; corrected age on this patient is 72 versus what I said last week. He has been for his arterial studies which predictably are not very good. On the right his ABI is 0.65. Monophasic waveforms noted at the right ankle including the posterior tibial and dorsalis pedis. He has triphasic waveforms of the common femoral and profunda femoris triphasic proximal SFA with monophasic distal FSA and popliteal artery. Monophasic tibial waveforms. On the left his ABI is  0.58 monophasic waveforms at the left ankle. Duplex of the left lower extremity demonstrated atherosclerotic change with monophasic waveforms throughout the left lower extremity. There was some concern about left iliac disease and potentially femoral-popliteal and tibial disease. The patient does not describe claudication although according to his wife he over emphasizes his activity. Patient states he is limited by pain in both knees His wounds are on the left lateral calf. 2 small areas with  necrotic debris. We have been using silver collagen and light Ace wraps 05/31/2018; wounds on the left lateral calf in the setting of very significant lymphedema and probably PAD. We have been using silver collagen. The base of the small wound looks reasonably improved. I sent him to see Dr. Fletcher Anon however I see now he has an appointment with Dr. Alvester Chou on February 12 2/5; left lateral calf wound looks the same. His wife is doing a good job with her lymphedema wraps and maintaining that the edema. He most likely has PAD and is due to see Dr. Gwenlyn Found of infectious disease on February 12 2/19; left lateral calf wounds look about the same. This looks like wound secondary to chronic venous insufficiency with lymphedema however the patient has very poor arterial studies. We referred him to Dr. Gwenlyn Found. Dr. Gwenlyn Found did not feel he needed to do anything from an arterial point of view. He did not address the question about the aggressiveness of compression. After some thoughts about this I elected to go ahead and put him in 3 layer compression which after all would be less compression then the lymphedema clinic was putting on this. I am hopeful that this should be enough to get some closure of the small wounds 2/26; left lateral calf wound letter this week. He tolerated the 3 layer compression well furthermore he is sleeping in a hospital bed which helps keep his legs up at night. The wound looks better measuring smaller  especially in width 3/3; left lateral calf wound about the same size. The 3 layer compression has really helped get the edema down in his leg however. Using silver collagen 3/11; left lateral calf wound better looking wound surface but about the same size. We have been using 3 layer compression which is really helped get the edema down in the left leg. Even after Dr. Kennon Holter assessment I am reluctant to go to 4 layer Steven Meza, Steven A. ([id number]) compression. He is tolerating 3 layer compression well. We have been using silver collagen 3/18-Patient returns for his left lateral calf wound which overall is looking better, slightly increased in size and dimensions and area, he is tolerating the 3 layer compression, has been dressed with silver collagen which we will continue 3/25 left lateral calf wound much better looking. Size is smaller. He has been using silver collagen 4/1; gradually getting smaller in size. Using silver collagen with 3 layer compression. I think the patient has some degree of PAD. He saw Dr. Gwenlyn Found in consultation, he did not specifically comment on whether he could tolerate 4 layer compression 4/8; the wound is slightly smaller in depth. We are using 3 layer compression with silver collagen. I had him seen for arterial insufficiency however his ABI was 0.57 in the left leg. Dr. Gwenlyn Found really did not seem to middle on the degree of compression he could tolerate 4/15; the wound is slightly smaller and slightly less deep. We have been using 3 layer compression with silver collagen. There is not an option for 4-layer compression here. 4/22; the patient is making decent progress on his wound on the left lateral calf however he arrives in with a superficial wound on the right anterior tibial area. Were not really sure how this happened. He had been using a stocking on the right leg. He has been using silver collagen on the wounds Amazing to me he is he appears to have compression  pumps at home. I am not really sure I knew this before.  In any case he has not been using them 4/29; left lateral calf wound continues to get gradually smaller. This looks like it is on its way to closure. The area on the right anterior tibial area is about the same in terms of size superficial without any depth. This was probably wrap injury He tells me that he has been using his compression pumps once a day for 1 hour without any pain 5/6; not as good today. Left lateral calf wound is actually larger and he does not have quite as good edema control. The area on the right anterior tibia is about the same still superficial. He states he is using his compression pumps once a day. We are using 3 layer compression on the left and Curlex Coban on the right 5/13; the area on the right is very close to being healed. The area on the left still has some depth but has a healthy wound surface. He has excellent edema control bilaterally. Using Hydrofera Blue 5/20-Patient returns in 1 week after being in 3 layer compression on the left, the left lateral calf ulcer is stable with some slough, the right anterior tibial ulcer is healed we have been using Kerlix Coban on that leg 5/27; Hydrofera Blue and 3 layer compression. Still some slough in the wound area and some nonviable edges around the wound. 6/3; this patient has lymphedema and chronic venous insufficiency. He had difficult to close wound on his left lateral calf. This is closed today. 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 Finally he has known PAD but he is managed to tolerate the compression we reporting on him and also using his compression pumps that he already had at home twice a day 6/10; we discharge this patient last week having finally close the small area in the left lateral calf. Apparently by Friday of last week this had opened and was draining. He is therefore back in clinic. His wife who is  present also was concerned about an area on the right anterior tibia."o 6/17; the patient does not have an open area on the right leg. On the left he has a small circular area anteriorly and the same area laterally that he had last time. However both of these appear to be improved he has his [place] wrap 30/40. It turns out that he is only using his external compression pumps once a day. I tried to get him up to twice a day today 6/24; the patient comes in with his external compression garment on the right leg/[place] wrap. As far as we know no open wounds here. He has 2 areas that are smaller one on the left anterior pretibial and one on the left lateral calf which is the site of his initial wounds. I have successfully caught him into his external compression pumps twice a day 7/1; still using his external compression garment on the right leg. As far as we know there are no open wounds here. The area on the left lateral calf which was the site of his initial wound has closed over. The area on the left anterior pretibial that we identified last week is still open. 7/8; as far as we know he has no open wounds on the right leg. He has his external compression garment on here. The original wound on the left lateral calf has closed over. He developed an area on the left anterior pretibial 2 weeks ago and seems to have 2 smaller areas  on the proximal and distal lateral calf. We do not have his good edema control this week as I am used to seeing 7/15; the patient has still a linear area on the left anterior tibia. The area on the left lateral leg looked somewhat inflamed today but no cellulitis. He did have what looked to be a small pustule that I cultured for CandS although I did not give him empiric antibiotics. 7/22; small area on the left anterior tibia. The area on the left lateral leg still looks inflamed. The pustule that I cultured last week grew Enterobacter and Pseudomonas. I prescribed Cipro  they still have not picked that up. In any case the area actually looks fairly satisfactory. He has decent edema control using his compression pumps twice a day 7/29; the area on the left lateral leg looks a lot better than last week. Nothing is open here. He has an area just medial to the mid anterior tibia that is still open that is only open wound today. 8/5-Patient returns at 1 week for the left leg ulcer, which is actually looking better, he is also complaining of left plantar foot Steven Meza, Steven A. ([id number]) pain below the great toe. He does have a hematoma here 8/12-Patient returns at 1 week, the left leg open area looks about the same if not slightly better, the left plantar hematoma under the great toe is about the same, patient apparently has not been resting his foot on anything for too long period of time and does not wear the [employer] shoes unless he walks outdoors which he does not do often 8/19; small wound on the left anterior lower leg this is just about closed there is nothing on the left lateral leg. He is using his compression pumps He arrives in clinic today with a black eschar over the right first plantar metatarsal head. This was apparently a bruise that was identified by her nurses last week. He wears crocs on his feet. He also may rub these on a footboard at home 8/26; there is nothing open on the left lateral lower leg he has a small area just medial to the tibia. He is going to see his podiatrist Dr. Milinda Pointer about the area on the left plantar first metatarsal head. He is using his external compression pumps. He has a juxta lite on the right leg 9/2; there is nothing open on either side of her left lower leg. He went to see podiatry about the area on the left first metatarsal head. This was apparently debrided. He was given mupirocin and oral antibiotics. I do not believe he is offloading this at all. Readmission: 04/02/2019 upon evaluation today patient actually  appears for reevaluation here in our clinic secondary to issues that he is having with his left lower extremity. He has been tolerating the dressing changes without complication. Fortunately there is no signs of active infection at this time. No fevers, chills, nausea, vomiting, or diarrhea. He has been in the hospital since we last saw him and then subsequently transferred back to [facility]. He tells me that they have been changing his dressing once a day and that he has had a significant amount of pain secondary to undergoing these dressing changes. Fortunately there is no evidence of active infection at this point which is good news. No fevers, chills, nausea, vomiting, or diarrhea. With that being said he wonders if we can go back to doing what we were doing before which was when he actually had  the dressing changes performed once a week here in the clinic he tells me that he did very well with that. At that point he tolerated a 3 layer compression wrap which was approved by vascular, Dr. Tyrell Antonio office, to be used previous although 4-layer compression wrap is probably too much for him. 04/16/2019 on evaluation today patient appears to be doing decently well with regard to his lower extremity although he has had a lot of drainage from his leg. A lot of this I think is just secondary to the lymphedema with that being said he does have some odor as well which may again just be due to more of the excessive moisture although I think that he may also have something that is a result of bacteria again he has somewhat of a Pseudomonas-like smell to the drainage and fortunately though he is not showing any signs of severe cellulitis he is still experiencing quite a bit of drainage and the discoloration is somewhat of a yellow/green in color which is consistent with Pseudomonas. If he can tolerated I think he may do well with Cipro. 04/24/2019 on evaluation today patient appears to be doing well  with regard to his wound in the gluteal region which is healing quite well. That is excellent news. With that being said he is also doing better with regard to his left lower extremity overall I feel like things are definitely showing signs of improvement. 12/28-Patient returns with regards to his gluteal wound that is improving, Left lower leg wounds with significant edema in the lower legs that appear to be persistent. Patient is in compression and has lymphedema pumps at home but apparently he does not use consistently Objective Constitutional alert and oriented x 3. sitting or standing blood pressure is within target range for patient.. supine blood pressure is within target range for patient.. pulse regular and within target range for patient.Marland Kitchen respirations regular, non-labored and within target range for patient.Marland Kitchen temperature within target range for patient.. Well-nourished and well-hydrated in no acute distress. Steven Meza, Steven Meza ([id number]) Vitals Time Taken: 1:35 PM, Height: 65 in, Weight: 273 lbs, BMI: 45.4, Temperature: 97.5 F, Pulse: 83 bpm, Respiratory Rate: 18 breaths/min, Blood Pressure: 146/43 mmHg. General Notes: Bilateral lower extremity edema with lower extremity wounds on Left sides, these are superficial and associated with scaling and activity drainage around with significant degree of maceration Gluteal wound appears to be stable with intact surrounding skin Integumentary (Hair, Skin) Wound #6 status is Open. Original cause of wound was Gradually Appeared. The wound is located on the Left,Circumferential Lower Leg. The wound measures 11cm length x 28cm width x 0.1cm depth; 241.903cm^2 area and 24.19cm^3 volume. There is Fat Layer (Subcutaneous Tissue) Exposed exposed. There is no tunneling or undermining noted. There is a large amount of serous drainage noted. The wound margin is indistinct and nonvisible. There is large (67- 100%) pink granulation within the wound bed.  There is a small (1-33%) amount of necrotic tissue within the wound bed including Adherent Slough. Wound #7 status is Healed - Epithelialized. Original cause of wound was Pressure Injury. The wound is located on the Left Gluteus. The wound measures 0cm length x 0cm width x 0cm depth; 0cm^2 area and 0cm^3 volume. There is Fat Layer (Subcutaneous Tissue) Exposed exposed. There is no tunneling or undermining noted. There is a medium amount of serous drainage noted. The wound margin is indistinct and nonvisible. There is medium (34-66%) red granulation within the wound bed. There is a small (1-33%)  amount of necrotic tissue within the wound bed including Adherent Slough. Plan Wound Cleansing: Wound #6 Left,Circumferential Lower Leg: May shower with protection. No tub bath. Primary Wound Dressing: Wound #6 Left,Circumferential Lower Leg: XtraSorb Dressing Change Frequency: Wound #6 Left,Circumferential Lower Leg: Change Dressing Monday, Wednesday, Friday - Home Health to change dressings on Wednesdays and Fridays Follow-up Appointments: Wound #6 Left,Circumferential Lower Leg: Return Appointment in 1 week. Nurse Visit as needed Edema Control: Wound #6 Left,Circumferential Lower Leg: 3 Layer Compression System - Left Lower Extremity Off-Loading: Wound #6 Left,Circumferential Lower Leg: Turn and reposition every 2 hours Home Health: Wound #6 Left,Circumferential Lower Leg: [place] Nurse may visit PRN to address patient s wound care needs. FACE TO FACE ENCOUNTER: MEDICARE and MEDICAID PATIENTS: I certify that this patient is under my care and that I had a face-to-face encounter that meets the physician face-to-face encounter requirements with this patient on this date. The encounter with the patient was in whole or in part for the following MEDICAL CONDITION: (primary reason for [place]) MEDICAL NECESSITY: I certify, that based on my findings,  NURSING services are a medically necessary home health service. HOME BOUND STATUS: I certify that my clinical findings support that this patient is homebound (i.e., Due to illness or injury, pt requires aid of supportive devices such as crutches, cane, wheelchairs, walkers, the use of special ELDRED, BOETTCHER ([id number]) transportation or the assistance of another person to leave their place of residence. There is a normal inability to leave the home and doing so requires considerable and taxing effort. Other absences are for medical reasons / religious services and are infrequent or of short duration when for other reasons). If current dressing causes regression in wound condition, may D/C ordered dressing product/s and apply Normal Saline Moist Dressing daily until next [place] / Other MD appointment. Start of regression in wound condition at [phone]. Please direct any NON-WOUND related issues/requests for orders to patient's Primary Care Physician 1. Continue with current dressing with 3 layer compression, 2. Continue efforts at offloading 3. Encourage patient to use lymphedema pumps 4. Return to clinic next week Electronic Signature(s) Signed: 05/01/2019 3:00:20 PM By: Tobi Bastos MD, MBA Entered By: Tobi Bastos on 05/01/2019 15:00:19 Steven Meza ([id number]) -------------------------------------------------------------------------------- SuperBill Details Patient Name: Steven Meza Date of Service: 04/30/2019 Medical Record Number: [id number] Patient Account Number: [id number] Date of Birth/Sex: [birth year] (85 y.o. M) Treating RN: Harold Barban Primary Care Provider: Fulton Reek Other Clinician: Referring Provider: Fulton Reek Treating Provider/Extender: Beverly Gust in Treatment: 4 Diagnosis Coding ICD-10 Codes Meza Description I89.0 Lymphedema, not elsewhere classified I87.2 Venous insufficiency  (chronic) (peripheral) L97.822 Non-pressure chronic ulcer of other part of left lower leg with fat layer exposed [place] (primary) hypertension I25.10 Atherosclerotic heart disease of native coronary artery without angina pectoris Facility Procedures CPT4 Meza: [id number] Description: 99213 - WOUND CARE VISIT-LEV 3 EST PT Modifier: Quantity: 1 Physician Procedures CPT4 Meza Description: [id number] - WC PHYS LEVEL 4 - EST PT ICD-10 Diagnosis Description L97.822 Non-pressure chronic ulcer of other part of left lower leg wit Modifier: h fat layer expos Quantity: 1 ed Electronic Signature(s) Signed: 05/01/2019 3:00:39 PM By: Tobi Bastos MD, MBA Entered By: Tobi Bastos on 05/01/2019 15:00:39

## 2019-05-07 ENCOUNTER — Other Ambulatory Visit: Payer: Self-pay

## 2019-05-07 ENCOUNTER — Encounter: Payer: Medicare Other | Attending: Physician Assistant | Admitting: Physician Assistant

## 2019-05-07 DIAGNOSIS — I890 Lymphedema, not elsewhere classified: Secondary | ICD-10-CM | POA: Insufficient documentation

## 2019-05-07 DIAGNOSIS — I872 Venous insufficiency (chronic) (peripheral): Secondary | ICD-10-CM | POA: Diagnosis not present

## 2019-05-07 DIAGNOSIS — L84 Corns and callosities: Secondary | ICD-10-CM | POA: Diagnosis not present

## 2019-05-07 DIAGNOSIS — Z6841 Body Mass Index (BMI) 40.0 and over, adult: Secondary | ICD-10-CM | POA: Insufficient documentation

## 2019-05-07 DIAGNOSIS — I878 Other specified disorders of veins: Secondary | ICD-10-CM | POA: Diagnosis not present

## 2019-05-07 DIAGNOSIS — I10 Essential (primary) hypertension: Secondary | ICD-10-CM | POA: Insufficient documentation

## 2019-05-07 DIAGNOSIS — L97822 Non-pressure chronic ulcer of other part of left lower leg with fat layer exposed: Secondary | ICD-10-CM | POA: Diagnosis not present

## 2019-05-07 DIAGNOSIS — M79672 Pain in left foot: Secondary | ICD-10-CM | POA: Insufficient documentation

## 2019-05-07 DIAGNOSIS — I2510 Atherosclerotic heart disease of native coronary artery without angina pectoris: Secondary | ICD-10-CM | POA: Diagnosis not present

## 2019-05-07 NOTE — Progress Notes (Signed)
[name], [name] ([id number]) Visit Report for 05/07/2019 Arrival Information Details Patient Name: Steven Meza, Steven Meza Date of Service: 05/07/2019 1:45 PM Medical Record Number: [id number] Patient Account Number: [id number] Date of Birth/Sex: Jun 23, 1930 (86 y.o. M) Treating RN: Montey Hora Primary Care Emmabelle Fear: Fulton Reek Other Clinician: Referring Raygen Dahm: Fulton Reek Treating Dalante Minus/Extender: Melburn Hake, HOYT Weeks in Treatment: 5 Visit Information History Since Last Visit Added or deleted any medications: No Patient Arrived: Walker Any new allergies or adverse reactions: No Arrival Time: 13:53 Had a fall or experienced change in No Accompanied By: wife activities of daily living that may affect Transfer Assistance: None risk of falls: Patient Identification Verified: Yes Signs or symptoms of abuse/neglect since last visito No Secondary Verification Process Completed: Yes Hospitalized since last visit: No Implantable device outside of the clinic excluding No cellular tissue based products placed in the center since last visit: Has Dressing in Place as Prescribed: Yes Has Compression in Place as Prescribed: Yes Pain Present Now: No Electronic Signature(s) Signed: 05/07/2019 4:34:19 PM By: Montey Hora Entered By: Montey Hora on 05/07/2019 13:56:36 Steven Meza ([id number]) -------------------------------------------------------------------------------- Clinic Level of Care Assessment Details Patient Name: Steven Meza Date of Service: 05/07/2019 1:45 PM Medical Record Number: [id number] Patient Account Number: [id number] Date of Birth/Sex: 04-Oct-1930 (86 y.o. M) Treating RN: Harold Barban Primary Care Kayleah Appleyard: Fulton Reek Other Clinician: Referring Samar Dass: Fulton Reek Treating Essie Lagunes/Extender: Melburn Hake, HOYT Weeks in Treatment: 5 Clinic Level of Care Assessment Items TOOL 4 Quantity Score []  - Use when only an EandM is performed on  FOLLOW-UP visit 0 ASSESSMENTS - Nursing Assessment / Reassessment X - Reassessment of Co-morbidities (includes updates in patient status) 1 10 X- 1 5 Reassessment of Adherence to Treatment Plan ASSESSMENTS - Wound and Skin Assessment / Reassessment X - Simple Wound Assessment / Reassessment - one wound 1 5 []  - 0 Complex Wound Assessment / Reassessment - multiple wounds []  - 0 Dermatologic / Skin Assessment (not related to wound area) ASSESSMENTS - Focused Assessment []  - Circumferential Edema Measurements - multi extremities 0 []  - 0 Nutritional Assessment / Counseling / Intervention []  - 0 Lower Extremity Assessment (monofilament, tuning fork, pulses) []  - 0 Peripheral Arterial Disease Assessment (using hand held doppler) ASSESSMENTS - Ostomy and/or Continence Assessment and Care []  - Incontinence Assessment and Management 0 []  - 0 Ostomy Care Assessment and Management (repouching, etc.) PROCESS - Coordination of Care X - Simple Patient / Family Education for ongoing care 1 15 []  - 0 Complex (extensive) Patient / Family Education for ongoing care []  - 0 Staff obtains Programmer, systems, Records, Test Results / Process Orders X- 1 10 Staff telephones HHA, Nursing Homes / Clarify orders / etc []  - 0 Routine Transfer to another Facility (non-emergent condition) []  - 0 Routine Hospital Admission (non-emergent condition) []  - 0 New Admissions / Biomedical engineer / Ordering NPWT, Apligraf, etc. []  - 0 Emergency Hospital Admission (emergent condition) X- 1 10 Simple Discharge Coordination ELPIDIO, HEINDL ([id number]) []  - 0 Complex (extensive) Discharge Coordination PROCESS - Special Needs []  - Pediatric / Minor Patient Management 0 []  - 0 Isolation Patient Management []  - 0 Hearing / Language / Visual special needs []  - 0 Assessment of Community assistance (transportation, D/C planning, etc.) []  - 0 Additional assistance / Altered mentation []  - 0 Support Surface(s)  Assessment (bed, cushion, seat, etc.) INTERVENTIONS - Wound Cleansing / Measurement X - Simple Wound Cleansing - one wound 1 5 []  - 0 Complex Wound Cleansing -  multiple wounds X- 1 5 Wound Imaging (photographs - any number of wounds) []  - 0 Wound Tracing (instead of photographs) X- 1 5 Simple Wound Measurement - one wound []  - 0 Complex Wound Measurement - multiple wounds INTERVENTIONS - Wound Dressings X - Small Wound Dressing one or multiple wounds 1 10 []  - 0 Medium Wound Dressing one or multiple wounds []  - 0 Large Wound Dressing one or multiple wounds []  - 0 Application of Medications - topical []  - 0 Application of Medications - injection INTERVENTIONS - Miscellaneous []  - External ear exam 0 []  - 0 Specimen Collection (cultures, biopsies, blood, body fluids, etc.) []  - 0 Specimen(s) / Culture(s) sent or taken to Lab for analysis []  - 0 Patient Transfer (multiple staff / Civil Service fast streamer / Similar devices) []  - 0 Simple Staple / Suture removal (25 or less) []  - 0 Complex Staple / Suture removal (26 or more) []  - 0 Hypo / Hyperglycemic Management (close monitor of Blood Glucose) []  - 0 Ankle / Brachial Index (ABI) - do not check if billed separately X- 1 5 Vital Signs BRITTEN, DRAGONETTI ([id number]) Has the patient been seen at the hospital within the last three years: Yes Total Score: 85 Level Of Care: New/Established - Level 3 Electronic Signature(s) Signed: 05/07/2019 4:45:12 PM By: Harold Barban Entered By: Harold Barban on 05/07/2019 14:18:41 Steven Meza ([id number]) -------------------------------------------------------------------------------- Encounter Discharge Information Details Patient Name: Steven Meza Date of Service: 05/07/2019 1:45 PM Medical Record Number: [id number] Patient Account Number: [id number] Date of Birth/Sex: Jan 15, 1931 (86 y.o. M) Treating RN: Harold Barban Primary Care Elvis Laufer: Fulton Reek Other Clinician: Referring  Brydan Downard: Fulton Reek Treating Eriberto Felch/Extender: Melburn Hake, HOYT Weeks in Treatment: 5 Encounter Discharge Information Items Discharge Condition: Stable Ambulatory Status: Walker Discharge Destination: Home Transportation: Private Auto Accompanied By: spouse Schedule Follow-up Appointment: Yes Clinical Summary of Care: Electronic Signature(s) Signed: 05/07/2019 4:45:12 PM By: Harold Barban Entered By: Harold Barban on 05/07/2019 14:28:10 Steven Meza ([id number]) -------------------------------------------------------------------------------- Lower Extremity Assessment Details Patient Name: Steven Meza Date of Service: 05/07/2019 1:45 PM Medical Record Number: [id number] Patient Account Number: [id number] Date of Birth/Sex: 05-Feb-1931 (86 y.o. M) Treating RN: Montey Hora Primary Care Bryttany Tortorelli: Fulton Reek Other Clinician: Referring Haven Foss: Fulton Reek Treating Lita Flynn/Extender: Melburn Hake, HOYT Weeks in Treatment: 5 Edema Assessment Assessed: [Left: No] [Right: No] Edema: [Left: Ye] [Right: s] Calf Left: Right: Point of Measurement: 33 cm From Medial Instep 46.5 cm cm Ankle Left: Right: Point of Measurement: 12 cm From Medial Instep 28.5 cm cm Vascular Assessment Pulses: Dorsalis Pedis Palpable: [Left:Yes] Electronic Signature(s) Signed: 05/07/2019 4:34:19 PM By: Montey Hora Entered By: Montey Hora on 05/07/2019 14:03:29 Steven Meza ([id number]) -------------------------------------------------------------------------------- Multi Wound Chart Details Patient Name: Steven Meza Date of Service: 05/07/2019 1:45 PM Medical Record Number: [id number] Patient Account Number: [id number] Date of Birth/Sex: 1930-12-11 (86 y.o. M) Treating RN: Harold Barban Primary Care Arnika Larzelere: Fulton Reek Other Clinician: Referring Malyiah Fellows: Fulton Reek Treating Jordyn Doane/Extender: Melburn Hake, HOYT Weeks in Treatment: 5 Vital Signs Height(in):  65 Pulse(bpm): 42 Weight(lbs): 273 Blood Pressure(mmHg): 138/46 Body Mass Index(BMI): 45 Temperature(F): 99.1 Respiratory Rate 20 (breaths/min): Photos: [N/A:N/A] Wound Location: Left Lower Leg - N/A N/A Circumferential Wounding Event: Gradually Appeared N/A N/A Primary Etiology: Venous Leg Ulcer N/A N/A Comorbid History: Lymphedema, Coronary Artery N/A N/A Disease, Hypertension Date Acquired: 03/02/2019 N/A N/A Weeks of Treatment: 5 N/A N/A Wound Status: Open N/A N/A Measurements L x W x D 11x28x0.1 N/A N/A (cm) Area (  cm) : 241.903 N/A N/A Volume (cm) : 24.19 N/A N/A % Reduction in Area: 12.30% N/A N/A % Reduction in Volume: 12.30% N/A N/A Classification: Partial Thickness N/A N/A Exudate Amount: Large N/A N/A Exudate Type: Serous N/A N/A Exudate Color: amber N/A N/A Wound Margin: Indistinct, nonvisible N/A N/A Granulation Amount: Large (67-100%) N/A N/A Granulation Quality: Pink N/A N/A Necrotic Amount: Small (1-33%) N/A N/A Exposed Structures: Fat Layer (Subcutaneous N/A N/A Tissue) Exposed: Yes Fascia: No Tendon: No Muscle: No JYQUEZ, VENEGAS ([id number]) Joint: No Bone: No Epithelialization: Small (1-33%) N/A N/A Treatment Notes Electronic Signature(s) Signed: 05/07/2019 4:45:12 PM By: Harold Barban Entered By: Harold Barban on 05/07/2019 14:15:46 Steven Meza ([id number]) -------------------------------------------------------------------------------- Multi-Disciplinary Care Plan Details Patient Name: Steven Meza Date of Service: 05/07/2019 1:45 PM Medical Record Number: [id number] Patient Account Number: [id number] Date of Birth/Sex: [birth year] (86 y.o. M) Treating RN: Harold Barban Primary Care Jaretzy Lhommedieu: Fulton Reek Other Clinician: Referring Alexy Bringle: Fulton Reek Treating Marionette Meskill/Extender: Melburn Hake, HOYT Weeks in Treatment: 5 Active Inactive Abuse / Safety / Falls / Self Care Management Nursing Diagnoses: Potential for  falls Goals: Patient will remain injury free related to falls Date Initiated: 04/24/2019 Target Resolution Date: 07/14/2019 Goal Status: Active Interventions: Assess fall risk on admission and as needed Notes: Venous Leg Ulcer Nursing Diagnoses: Actual venous Insuffiency (use after diagnosis is confirmed) Goals: Patient will maintain optimal edema control Date Initiated: 04/24/2019 Target Resolution Date: 07/14/2019 Goal Status: Active Interventions: Compression as ordered Notes: Electronic Signature(s) Signed: 05/07/2019 4:45:12 PM By: Harold Barban Entered By: Harold Barban on 05/07/2019 14:15:32 Steven Meza ([id number]) -------------------------------------------------------------------------------- Pain Assessment Details Patient Name: Steven Meza Date of Service: 05/07/2019 1:45 PM Medical Record Number: [id number] Patient Account Number: [id number] Date of Birth/Sex: 27-Aug-1930 (86 y.o. M) Treating RN: Montey Hora Primary Care Thos Matsumoto: Fulton Reek Other Clinician: Referring Naeema Patlan: Fulton Reek Treating Raeshawn Vo/Extender: Melburn Hake, HOYT Weeks in Treatment: 5 Active Problems Location of Pain Severity and Description of Pain Patient Has Paino No Site Locations Pain Management and Medication Current Pain Management: Electronic Signature(s) Signed: 05/07/2019 4:34:19 PM By: Montey Hora Entered By: Montey Hora on 05/07/2019 13:56:47 Steven Meza ([id number]) -------------------------------------------------------------------------------- Patient/Caregiver Education Details Patient Name: Steven Meza Date of Service: 05/07/2019 1:45 PM Medical Record Number: [id number] Patient Account Number: [id number] Date of Birth/Gender: Jul 01, 1930 (86 y.o. M) Treating RN: Harold Barban Primary Care Physician: Fulton Reek Other Clinician: Referring Physician: Fulton Reek Treating Physician/Extender: Sharalyn Ink in Treatment:  5 Education Assessment Education Provided To: Patient Education Topics Provided Wound/Skin Impairment: Handouts: Caring for Your Ulcer Methods: Demonstration, Explain/Verbal Responses: State content correctly Electronic Signature(s) Signed: 05/07/2019 4:45:12 PM By: Harold Barban Entered By: Harold Barban on 05/07/2019 14:16:12 Steven Meza ([id number]) -------------------------------------------------------------------------------- Wound Assessment Details Patient Name: Steven Meza Date of Service: 05/07/2019 1:45 PM Medical Record Number: [id number] Patient Account Number: [id number] Date of Birth/Sex: [birth year] (86 y.o. M) Treating RN: Montey Hora Primary Care Bashar Milam: Fulton Reek Other Clinician: Referring Agustus Mane: Fulton Reek Treating Rylen Swindler/Extender: Melburn Hake, HOYT Weeks in Treatment: 5 Wound Status Wound Number: 6 Primary Venous Leg Ulcer Etiology: Wound Location: Left Lower Leg - Circumferential Wound Status: Open Wounding Event: Gradually Appeared Comorbid Lymphedema, Coronary Artery Disease, Date Acquired: 03/02/2019 History: Hypertension Weeks Of Treatment: 5 Clustered Wound: No Photos Wound Measurements Length: (cm) 11 Width: (cm) 28 Depth: (cm) 0.1 Area: (cm) 241.903 Volume: (cm) 24.19 % Reduction in Area: 12.3% % Reduction in Volume: 12.3% Epithelialization: Small (1-33%) Tunneling: No Undermining: No  Wound Description Classification: Partial Thickness Foul Odor Wound Margin: Indistinct, nonvisible Slough/Fi Exudate Amount: Large Exudate Type: Serous Exudate Color: amber After Cleansing: No brino Yes Wound Bed Granulation Amount: Large (67-100%) Exposed Structure Granulation Quality: Pink Fascia Exposed: No Necrotic Amount: Small (1-33%) Fat Layer (Subcutaneous Tissue) Exposed: Yes Necrotic Quality: Adherent Slough Tendon Exposed: No Muscle Exposed: No Joint Exposed: No Bone Exposed: No Treatment Notes ALEXANDROS, ERVIN ([id number]) Wound #6 (Left, Circumferential Lower Leg) Notes Xtrasorb, 3 Layer right leg Electronic Signature(s) Signed: 05/07/2019 4:34:19 PM By: Montey Hora Entered By: Montey Hora on 05/07/2019 14:06:50 Steven Meza ([id number]) -------------------------------------------------------------------------------- Vitals Details Patient Name: Steven Meza Date of Service: 05/07/2019 1:45 PM Medical Record Number: [id number] Patient Account Number: [id number] Date of Birth/Sex: 1931-03-02 (87 y.o. M) Treating RN: Montey Hora Primary Care Yunique Dearcos: Fulton Reek Other Clinician: Referring Dmarius Reeder: Fulton Reek Treating Batool Majid/Extender: Melburn Hake, HOYT Weeks in Treatment: 5 Vital Signs Time Taken: 13:56 Temperature (F): 99.1 Height (in): 65 Pulse (bpm): 89 Weight (lbs): 273 Respiratory Rate (breaths/min): 20 Body Mass Index (BMI): 45.4 Blood Pressure (mmHg): 138/46 Reference Range: 80 - 120 mg / dl Electronic Signature(s) Signed: 05/07/2019 4:34:19 PM By: Montey Hora Entered By: Montey Hora on 05/07/2019 14:02:07

## 2019-05-07 NOTE — Progress Notes (Addendum)
KACIN, CESARE ([id number]) Visit Report for 05/07/2019 Chief Complaint Document Details Patient Name: Steven Meza, Steven Meza Date of Service: 05/07/2019 1:45 PM Medical Record Number: [id number] Patient Account Number: [id number] Date of Birth/Sex: [birth year] (86 y.o. M) Treating RN: Harold Barban Primary Care Provider: Fulton Reek Other Clinician: Referring Provider: Fulton Reek Treating Provider/Extender: Melburn Hake, Filimon Miranda Weeks in Treatment: 5 Information Obtained from: Patient Chief Complaint Left LE Ulcer Electronic Signature(s) Signed: 05/07/2019 2:13:20 PM By: Worthy Keeler PA-C Entered By: Worthy Keeler on 05/07/2019 14:13:20 Steven Meza ([id number]) -------------------------------------------------------------------------------- HPI Details Patient Name: Steven Meza Date of Service: 05/07/2019 1:45 PM Medical Record Number: [id number] Patient Account Number: [id number] Date of Birth/Sex: [birth year] (86 y.o. M) Treating RN: Harold Barban Primary Care Provider: Fulton Reek Other Clinician: Referring Provider: Fulton Reek Treating Provider/Extender: Melburn Hake, Fizza Scales Weeks in Treatment: 5 History of Present Illness HPI Description: ADMISSION 05/17/2018 Steven Meza is an 86 year old man who is been treated at the lymphedema clinic for a long period with lymphedema wraps for bilateral lower extremity lymphedema. About 6 months ago they noted small open areas on his left lateral calf just above the ankle. These were open. They are apparently putting some form of ointment on this. They have been referred here for our review of this. The patient has a long history of lymphedema and venous stasis. It says in his records that he has recurrent cellulitis although his wife denies this but she states that the lymphedema may have started with cellulitis several years ago. Also in his record there is a history of bladder cancer which they seem to know very little about  however he did not have any radiation to the pelvis or anything that could have contributed to lymphedema that they are aware of. There is no prior wound history. The patient has 2 small punched out areas on the left lateral calf that have adherent debris on the surface. Past medical history; lymphedema, hypertension, cellulitis, hearing loss, morbid obesity, osteoarthritis, venous stasis, bladder CA, diverticulosis. ABIs in our clinic were 0.36 on the right and 0.57 on the left 05/24/2018; corrected age on this patient is 34 versus what I said last week. He has been for his arterial studies which predictably are not very good. On the right his ABI is 0.65. Monophasic waveforms noted at the right ankle including the posterior tibial and dorsalis pedis. He has triphasic waveforms of the common femoral and profunda femoris triphasic proximal SFA with monophasic distal FSA and popliteal artery. Monophasic tibial waveforms. On the left his ABI is 0.58 monophasic waveforms at the left ankle. Duplex of the left lower extremity demonstrated atherosclerotic change with monophasic waveforms throughout the left lower extremity. There was some concern about left iliac disease and potentially femoral-popliteal and tibial disease. The patient does not describe claudication although according to his wife he over emphasizes his activity. Patient states he is limited by pain in both knees His wounds are on the left lateral calf. 2 small areas with necrotic debris. We have been using silver collagen and light Ace wraps 05/31/2018; wounds on the left lateral calf in the setting of very significant lymphedema and probably PAD. We have been using silver collagen. The base of the small wound looks reasonably improved. I sent him to see Dr. Fletcher Anon however I see now he has an appointment with Dr. Alvester Chou on February 12 2/5; left lateral calf wound looks the same. His wife is doing a good job with her lymphedema wraps and  maintaining that the edema. He most likely has PAD and is due to see Dr. Gwenlyn Found of infectious disease on February 12 2/19; left lateral calf wounds look about the same. This looks like wound secondary to chronic venous insufficiency with lymphedema however the patient has very poor arterial studies. We referred him to Dr. Gwenlyn Found. Dr. Gwenlyn Found did not feel he needed to do anything from an arterial point of view. He did not address the question about the aggressiveness of compression. After some thoughts about this I elected to go ahead and put him in 3 layer compression which after all would be less compression then the lymphedema clinic was putting on this. I am hopeful that this should be enough to get some closure of the small wounds 2/26; left lateral calf wound letter this week. He tolerated the 3 layer compression well furthermore he is sleeping in a hospital bed which helps keep his legs up at night. The wound looks better measuring smaller especially in width 3/3; left lateral calf wound about the same size. The 3 layer compression has really helped get the edema down in his leg however. Using silver collagen 3/11; left lateral calf wound better looking wound surface but about the same size. We have been using 3 layer compression which is really helped get the edema down in the left leg. Even after Dr. Kennon Holter assessment I am reluctant to go to 4 layer compression. He is tolerating 3 layer compression well. We have been using silver collagen TORRES, SEAGO ([id number]) 3/18-Patient returns for his left lateral calf wound which overall is looking better, slightly increased in size and dimensions and area, he is tolerating the 3 layer compression, has been dressed with silver collagen which we will continue 3/25oleft lateral calf wound much better looking. Size is smaller. He has been using silver collagen 4/1; gradually getting smaller in size. Using silver collagen with 3 layer compression. I  think the patient has some degree of PAD. He saw Dr. Gwenlyn Found in consultation, he did not specifically comment on whether he could tolerate 4 layer compression 4/8; the wound is slightly smaller in depth. We are using 3 layer compression with silver collagen. I had him seen for arterial insufficiency however his ABI was 0.57 in the left leg. Dr. Gwenlyn Found really did not seem to middle on the degree of compression he could tolerate 4/15; the wound is slightly smaller and slightly less deep. We have been using 3 layer compression with silver collagen. There is not an option for 4-layer compression here. 4/22; the patient is making decent progress on his wound on the left lateral calf however he arrives in with a superficial wound on the right anterior tibial area. Were not really sure how this happened. He had been using a stocking on the right leg. He has been using silver collagen on the wounds Amazing to me he is he appears to have compression pumps at home. I am not really sure I knew this before. In any case he has not been using them 4/29; left lateral calf wound continues to get gradually smaller. This looks like it is on its way to closure. oThe area on the right anterior tibial area is about the same in terms of size superficial without any depth. This was probably wrap injury oHe tells me that he has been using his compression pumps once a day for 1 hour without any pain 5/6; not as good today. Left lateral calf wound is actually larger and  he does not have quite as good edema control. The area on the right anterior tibia is about the same still superficial. He states he is using his compression pumps once a day. We are using 3 layer compression on the left and Curlex Coban on the right 5/13; the area on the right is very close to being healed. The area on the left still has some depth but has a healthy wound surface. He has excellent edema control bilaterally. Using Hydrofera Blue 5/20-Patient  returns in 1 week after being in 3 layer compression on the left, the left lateral calf ulcer is stable with some slough, the right anterior tibial ulcer is healed we have been using Kerlix Coban on that leg 5/27; Hydrofera Blue and 3 layer compression. Still some slough in the wound area and some nonviable edges around the wound. 6/3; this patient has lymphedema and chronic venous insufficiency. He had difficult to close wound on his left lateral calf. This is closed today. 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 Finally he has known PAD but he is managed to tolerate the compression we reporting on him and also using his compression pumps that he already had at home twice a day 6/10; we discharge this patient last week having finally close the small area in the left lateral calf. Apparently by Friday of last week this had opened and was draining. He is therefore back in clinic. His wife who is present also was concerned about an area on the right anterior tibia."o 6/17; the patient does not have an open area on the right leg. On the left he has a small circular area anteriorly and the same area laterally that he had last time. However both of these appear to be improved he has his Farrow wrap 30/40. It turns out that he is only using his external compression pumps once a day. I tried to get him up to twice a day today 6/24; the patient comes in with his external compression garment on the right leg/Farrow wrap. As far as we know no open wounds here. He has 2 areas that are smaller one on the left anterior pretibial and one on the left lateral calf which is the site of his initial wounds. I have successfully caught him into his external compression pumps twice a day 7/1; still using his external compression garment on the right leg. As far as we know there are no open wounds here. The area on the left lateral calf which was the site of his initial wound has  closed over. The area on the left anterior pretibial that we identified last week is still open. 7/8; as far as we know he has no open wounds on the right leg. He has his external compression garment on here. The original wound on the left lateral calf has closed over. He developed an area on the left anterior pretibial 2 weeks ago and seems to have 2 smaller areas on the proximal and distal lateral calf. We do not have his good edema control this week as I am used to seeing 7/15; the patient has still a linear area on the left anterior tibia. The area on the left lateral leg looked somewhat inflamed today but no cellulitis. He did have what looked to be a small pustule that I cultured for CandS although I did not give him empiric antibiotics. 7/22; small area on the left anterior tibia. The area on the left  lateral leg still looks inflamed. The pustule that I cultured last week grew Enterobacter and Pseudomonas. I prescribed Cipro they still have not picked that up. In any case the area actually looks fairly satisfactory. He has decent edema control using his compression pumps twice a day 7/29; the area on the left lateral leg looks a lot better than last week. Nothing is open here. He has an area just medial to the mid anterior tibia that is still open that is only open wound today. 8/5-Patient returns at 1 week for the left leg ulcer, which is actually looking better, he is also complaining of left plantar foot pain below the great toe. He does have a hematoma here CORTAVIUS, BARRENTINE ([id number]) 8/12-Patient returns at 1 week, the left leg open area looks about the same if not slightly better, the left plantar hematoma under the great toe is about the same, patient apparently has not been resting his foot on anything for too long period of time and does not wear the Birkenstock shoes unless he walks outdoors which he does not do often 8/19; small wound on the left anterior lower leg this is  just about closed there is nothing on the left lateral leg. He is using his compression pumps He arrives in clinic today with a black eschar over the right first plantar metatarsal head. This was apparently a bruise that was identified by her nurses last week. He wears crocs on his feet. He also may rub these on a footboard at home 8/26; there is nothing open on the left lateral lower leg he has a small area just medial to the tibia. He is going to see his podiatrist Dr. Milinda Pointer about the area on the left plantar first metatarsal head. He is using his external compression pumps. He has a juxta lite on the right leg 9/2; there is nothing open on either side of her left lower leg. He went to see podiatry about the area on the left first metatarsal head. This was apparently debrided. He was given mupirocin and oral antibiotics. I do not believe he is offloading this at all. Readmission: 04/02/2019 upon evaluation today patient actually appears for reevaluation here in our clinic secondary to issues that he is having with his left lower extremity. He has been tolerating the dressing changes without complication. Fortunately there is no signs of active infection at this time. No fevers, chills, nausea, vomiting, or diarrhea. He has been in the hospital since we last saw him and then subsequently transferred back to [facility]. He tells me that they have been changing his dressing once a day and that he has had a significant amount of pain secondary to undergoing these dressing changes. Fortunately there is no evidence of active infection at this point which is good news. No fevers, chills, nausea, vomiting, or diarrhea. With that being said he wonders if we can go back to doing what we were doing before which was when he actually had the dressing changes performed once a week here in the clinic he tells me that he did very well with that. At that point he tolerated a 3 layer compression wrap which  was approved by vascular, Dr. Tyrell Antonio office, to be used previous although 4-layer compression wrap is probably too much for him. 04/16/2019 on evaluation today patient appears to be doing decently well with regard to his lower extremity although he has had a lot of drainage from his leg. A lot of this I  think is just secondary to the lymphedema with that being said he does have some odor as well which may again just be due to more of the excessive moisture although I think that he may also have something that is a result of bacteria again he has somewhat of a Pseudomonas-like smell to the drainage and fortunately though he is not showing any signs of severe cellulitis he is still experiencing quite a bit of drainage and the discoloration is somewhat of a yellow/green in color which is consistent with Pseudomonas. If he can tolerated I think he may do well with Cipro. 04/24/2019 on evaluation today patient appears to be doing well with regard to his wound in the gluteal region which is healing quite well. That is excellent news. With that being said he is also doing better with regard to his left lower extremity overall I feel like things are definitely showing signs of improvement. 12/28-Patient returns with regards to his gluteal wound that is improving, Left lower leg wounds with significant edema in the lower legs that appear to be persistent. Patient is in compression and has lymphedema pumps at home but apparently he does not use consistently 05/07/2019 Upon inspection patient's wound bed actually showed signs on evaluation today patient's wound currently showed signs of improvement. There is still a lot of maceration but it does look like that there is been some of this maceration occurring as a result of drainage in particular and some as a result of the fact that they have been putting bag balm on the wound area as well as the leg in general. I think this is applying too much moisture and  not doing him any good. I discussed with the patient and his wife today that I think that practice needs to be adjusted I would recommend using a different type of lotion in order to help with moisture around the wound but I would not recommend anything directly on the wound itself. Electronic Signature(s) Signed: 05/09/2019 9:48:42 AM By: Worthy Keeler PA-C Entered By: Worthy Keeler on 05/09/2019 09:48:41 Steven Meza ([id number]) -------------------------------------------------------------------------------- Callus Pairing Details Patient Name: Steven Meza Date of Service: 05/07/2019 1:45 PM Medical Record Number: [id number] Patient Account Number: [id number] Date of Birth/Sex: [birth year] (86 y.o. M) Treating RN: Harold Barban Primary Care Provider: Fulton Reek Other Clinician: Referring Provider: Fulton Reek Treating Provider/Extender: Melburn Hake, Darlyn Repsher Weeks in Treatment: 5 Procedure Performed for: Non-Wound Location Performed By: Physician STONE III, Wilson Dusenbery E., PA-C Post Procedure Diagnosis Same as Pre-procedure Notes I performed a callus. On the left plantar foot which the patient tolerated today without complication and post paring there does not appear to be any open wound at this time. This location was on the left plantar aspect of the first metatarsal region. Electronic Signature(s) Signed: 05/09/2019 9:52:46 AM By: Worthy Keeler PA-C Entered By: Worthy Keeler on 05/09/2019 09:52:45 Steven Meza ([id number]) -------------------------------------------------------------------------------- Physical Exam Details Patient Name: Steven Meza Date of Service: 05/07/2019 1:45 PM Medical Record Number: [id number] Patient Account Number: [id number] Date of Birth/Sex: 1930/08/22 (86 y.o. M) Treating RN: Harold Barban Primary Care Provider: Fulton Reek Other Clinician: Referring Provider: Fulton Reek Treating Provider/Extender: Melburn Hake, Carolos Fecher Weeks  in Treatment: 5 Constitutional Well-nourished and well-hydrated in no acute distress. Respiratory normal breathing without difficulty. Psychiatric this patient is able to make decisions and demonstrates good insight into disease process. Alert and Oriented x 3. pleasant and cooperative. Notes Patient's wound bed currently showed signs  of fairly good granulation at this time in any areas with a lot of epithelization. There was also a lot of unfortunately maceration noted as well which is not good at all. I do believe that the bag balm may be playing a role here and I think we need to discontinue that. Otherwise I think that in general no debridement was necessary today and I feel like he is making some progress here. He did have an area of callus on the plantar aspect of his left foot unrelated to anything going on with his wounds that did require some callus paring today. That was discussed with the patient and he did want me to proceed as such. I therefore was able to remove the callus fortunately there was no wound underneath this which is excellent news. I was somewhat concerned there may be something open underneath being that he was having significant pain otherwise based on what I am seeing I think this was just a result of him walking on the hard callus causing discomfort. Electronic Signature(s) Signed: 05/09/2019 9:50:22 AM By: Worthy Keeler PA-C Entered By: Worthy Keeler on 05/09/2019 09:50:22 Steven Meza ([id number]) -------------------------------------------------------------------------------- Physician Orders Details Patient Name: Steven Meza Date of Service: 05/07/2019 1:45 PM Medical Record Number: [id number] Patient Account Number: [id number] Date of Birth/Sex: Mar 27, 1931 (86 y.o. M) Treating RN: Harold Barban Primary Care Provider: Fulton Reek Other Clinician: Referring Provider: Fulton Reek Treating Provider/Extender: Melburn Hake, Hatim Homann Weeks in  Treatment: 5 Verbal / Phone Orders: No Diagnosis Coding ICD-10 Coding Meza Description I89.0 Lymphedema, not elsewhere classified I87.2 Venous insufficiency (chronic) (peripheral) L97.822 Non-pressure chronic ulcer of other part of left lower leg with fat layer exposed I10 Essential (primary) hypertension I25.10 Atherosclerotic heart disease of native coronary artery without angina pectoris Wound Cleansing Wound #6 Left,Circumferential Lower Leg o May shower with protection. o No tub bath. Primary Wound Dressing Wound #6 Left,Circumferential Lower Leg o XtraSorb Dressing Change Frequency Wound #6 Left,Circumferential Lower Leg o Change Dressing Monday, Wednesday, Friday - Home Health to change dressings on Wednesdays and Fridays Follow-up Appointments Wound #6 Left,Circumferential Lower Leg o Return Appointment in 1 week. o Nurse Visit as needed Edema Control Wound #6 Left,Circumferential Lower Leg o 3 Layer Compression System - Left Lower Extremity Off-Loading Wound #6 Left,Circumferential Lower Leg o Turn and reposition every 2 hours Home Health Wound #6 Left,Circumferential Lower Leg o [facility] - [facility] o Home Health Nurse may visit PRN to address patientos wound care needs. o FACE TO FACE ENCOUNTER: MEDICARE and MEDICAID PATIENTS: I certify that this patient is under my care and that I had a face-to-face encounter that meets the physician face-to-face encounter requirements with this DYWANE, COLSTON ([id number]) patient on this date. The encounter with the patient was in whole or in part for the following MEDICAL CONDITION: (primary reason for Marie) MEDICAL NECESSITY: I certify, that based on my findings, NURSING services are a medically necessary home health service. HOME BOUND STATUS: I certify that my clinical findings support that this patient is homebound (i.e., Due to illness or injury, pt requires aid  of supportive devices such as crutches, cane, wheelchairs, walkers, the use of special transportation or the assistance of another person to leave their place of residence. There is a normal inability to leave the home and doing so requires considerable and taxing effort. Other absences are for medical reasons / religious services and are infrequent or of short duration when for other reasons).   o If current dressing causes regression in wound condition, may D/C ordered dressing product/s and apply Normal Saline Moist Dressing daily until next [place] / Other MD appointment. [place] of regression in wound condition at [phone]. o Please direct any NON-WOUND related issues/requests for orders to patient's Primary Care Physician Electronic Signature(s) Signed: 05/07/2019 4:45:12 PM By: Harold Barban Signed: 05/09/2019 6:44:22 PM By: Worthy Keeler PA-C Entered By: Harold Barban on 05/07/2019 14:25:32 Steven Meza ([id number]) -------------------------------------------------------------------------------- Problem List Details Patient Name: Steven Meza Date of Service: 05/07/2019 1:45 PM Medical Record Number: [id number] Patient Account Number: [id number] Date of Birth/Sex: 24-May-1930 (86 y.o. M) Treating RN: Harold Barban Primary Care Provider: Fulton Reek Other Clinician: Referring Provider: Fulton Reek Treating Provider/Extender: Melburn Hake, Trevyn Lumpkin Weeks in Treatment: 5 Active Problems ICD-10 Evaluated Encounter Meza Description Active Date Today Diagnosis I89.0 Lymphedema, not elsewhere classified 04/02/2019 No Yes I87.2 Venous insufficiency (chronic) (peripheral) 04/02/2019 No Yes L97.822 Non-pressure chronic ulcer of other part of left lower leg with 04/02/2019 No Yes fat layer exposed I10 Essential (primary) hypertension 04/02/2019 No Yes I25.10 Atherosclerotic heart disease of native coronary artery 04/02/2019 No  Yes without angina pectoris L84 Corns and callosities 05/09/2019 No Yes Inactive Problems Resolved Problems Electronic Signature(s) Signed: 05/09/2019 9:49:37 AM By: Worthy Keeler PA-C Previous Signature: 05/07/2019 2:13:06 PM Version By: Worthy Keeler PA-C Entered By: Worthy Keeler on 05/09/2019 09:49:36 E[facility] Epifania Gore ([id number]) -------------------------------------------------------------------------------- Progress Note Details Patient Name: Steven Meza Date of Service: 05/07/2019 1:45 PM Medical Record Number: [id number] Patient Account Number: [id number] Date of Birth/Sex: 07/30/1930 (86 y.o. M) Treating RN: Harold Barban Primary Care Provider: Fulton Reek Other Clinician: Referring Provider: Fulton Reek Treating Provider/Extender: Melburn Hake, Arnav Cregg Weeks in Treatment: 5 Subjective Chief Complaint Information obtained from Patient Left LE Ulcer History of Present Illness (HPI) ADMISSION 05/17/2018 Mr. Ballog is an 86 year old man who is been treated at the lymphedema clinic for a long period with lymphedema wraps for bilateral lower extremity lymphedema. About 6 months ago they noted small open areas on his left lateral calf just above the ankle. These were open. They are apparently putting some form of ointment on this. They have been referred here for our review of this. The patient has a long history of lymphedema and venous stasis. It says in his records that he has recurrent cellulitis although his wife denies this but she states that the lymphedema may have started with cellulitis several years ago. Also in his record there is a history of bladder cancer which they seem to know very little about however he did not have any radiation to the pelvis or anything that could have contributed to lymphedema that they are aware of. There is no prior wound history. The patient has 2 small punched out areas on the left lateral calf that have adherent debris on the  surface. Past medical history; lymphedema, hypertension, cellulitis, hearing loss, morbid obesity, osteoarthritis, venous stasis, bladder CA, diverticulosis. ABIs in our clinic were 0.36 on the right and 0.57 on the left 05/24/2018; corrected age on this patient is 62 versus what I said last week. He has been for his arterial studies which predictably are not very good. On the right his ABI is 0.65. Monophasic waveforms noted at the right ankle including the posterior tibial and dorsalis pedis. He has triphasic waveforms of the common femoral and profunda femoris triphasic proximal SFA with monophasic distal FSA and popliteal artery. Monophasic tibial waveforms. On  the left his ABI is 0.58 monophasic waveforms at the left ankle. Duplex of the left lower extremity demonstrated atherosclerotic change with monophasic waveforms throughout the left lower extremity. There was some concern about left iliac disease and potentially femoral-popliteal and tibial disease. The patient does not describe claudication although according to his wife he over emphasizes his activity. Patient states he is limited by pain in both knees His wounds are on the left lateral calf. 2 small areas with necrotic debris. We have been using silver collagen and light Ace wraps 05/31/2018; wounds on the left lateral calf in the setting of very significant lymphedema and probably PAD. We have been using silver collagen. The base of the small wound looks reasonably improved. I sent him to see Dr. Fletcher Anon however I see now he has an appointment with Dr. Alvester Chou on February 12 2/5; left lateral calf wound looks the same. His wife is doing a good job with her lymphedema wraps and maintaining that the edema. He most likely has PAD and is due to see Dr. Gwenlyn Found of infectious disease on February 12 2/19; left lateral calf wounds look about the same. This looks like wound secondary to chronic venous insufficiency with lymphedema however the  patient has very poor arterial studies. We referred him to Dr. Gwenlyn Found. Dr. Gwenlyn Found did not feel he needed to do anything from an arterial point of view. He did not address the question about the aggressiveness of compression. After some thoughts about this I elected to go ahead and put him in 3 layer compression which after all would be less compression then the lymphedema clinic was putting on this. I am hopeful that this should be enough to get some closure of the small wounds 2/26; left lateral calf wound letter this week. He tolerated the 3 layer compression well furthermore he is sleeping in a hospital ALVERTO, NONAKA A. ([id number]) bed which helps keep his legs up at night. The wound looks better measuring smaller especially in width 3/3; left lateral calf wound about the same size. The 3 layer compression has really helped get the edema down in his leg however. Using silver collagen 3/11; left lateral calf wound better looking wound surface but about the same size. We have been using 3 layer compression which is really helped get the edema down in the left leg. Even after Dr. Kennon Holter assessment I am reluctant to go to 4 layer compression. He is tolerating 3 layer compression well. We have been using silver collagen 3/18-Patient returns for his left lateral calf wound which overall is looking better, slightly increased in size and dimensions and area, he is tolerating the 3 layer compression, has been dressed with silver collagen which we will continue 3/25 left lateral calf wound much better looking. Size is smaller. He has been using silver collagen 4/1; gradually getting smaller in size. Using silver collagen with 3 layer compression. I think the patient has some degree of PAD. He saw Dr. Gwenlyn Found in consultation, he did not specifically comment on whether he could tolerate 4 layer compression 4/8; the wound is slightly smaller in depth. We are using 3 layer compression with silver collagen. I  had him seen for arterial insufficiency however his ABI was 0.57 in the left leg. Dr. Gwenlyn Found really did not seem to middle on the degree of compression he could tolerate 4/15; the wound is slightly smaller and slightly less deep. We have been using 3 layer compression with silver collagen. There is not an option  for 4-layer compression here. 4/22; the patient is making decent progress on his wound on the left lateral calf however he arrives in with a superficial wound on the right anterior tibial area. Were not really sure how this happened. He had been using a stocking on the right leg. He has been using silver collagen on the wounds Amazing to me he is he appears to have compression pumps at home. I am not really sure I knew this before. In any case he has not been using them 4/29; left lateral calf wound continues to get gradually smaller. This looks like it is on its way to closure. The area on the right anterior tibial area is about the same in terms of size superficial without any depth. This was probably wrap injury He tells me that he has been using his compression pumps once a day for 1 hour without any pain 5/6; not as good today. Left lateral calf wound is actually larger and he does not have quite as good edema control. The area on the right anterior tibia is about the same still superficial. He states he is using his compression pumps once a day. We are using 3 layer compression on the left and Curlex Coban on the right 5/13; the area on the right is very close to being healed. The area on the left still has some depth but has a healthy wound surface. He has excellent edema control bilaterally. Using Hydrofera Blue 5/20-Patient returns in 1 week after being in 3 layer compression on the left, the left lateral calf ulcer is stable with some slough, the right anterior tibial ulcer is healed we have been using Kerlix Coban on that leg 5/27; Hydrofera Blue and 3 layer compression. Still  some slough in the wound area and some nonviable edges around the wound. 6/3; this patient has lymphedema and chronic venous insufficiency. He had difficult to close wound on his left lateral calf. This is closed today. 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 Finally he has known PAD but he is managed to tolerate the compression we reporting on him and also using his compression pumps that he already had at home twice a day 6/10; we discharge this patient last week having finally close the small area in the left lateral calf. Apparently by Friday of last week this had opened and was draining. He is therefore back in clinic. His wife who is present also was concerned about an area on the right anterior tibia."o 6/17; the patient does not have an open area on the right leg. On the left he has a small circular area anteriorly and the same area laterally that he had last time. However both of these appear to be improved he has his Farrow wrap 30/40. It turns out that he is only using his external compression pumps once a day. I tried to get him up to twice a day today 6/24; the patient comes in with his external compression garment on the right leg/Farrow wrap. As far as we know no open wounds here. He has 2 areas that are smaller one on the left anterior pretibial and one on the left lateral calf which is the site of his initial wounds. I have successfully caught him into his external compression pumps twice a day 7/1; still using his external compression garment on the right leg. As far as we know there are no open wounds here. The area on the left  lateral calf which was the site of his initial wound has closed over. The area on the left anterior pretibial that we identified last week is still open. 7/8; as far as we know he has no open wounds on the right leg. He has his external compression garment on here. The original wound on the left lateral calf has  closed over. He developed an area on the left anterior pretibial 2 weeks ago and seems to have 2 smaller areas on the proximal and distal lateral calf. We do not have his good edema control this week as I am used to seeing 7/15; the patient has still a linear area on the left anterior tibia. The area on the left lateral leg looked somewhat inflamed today but no cellulitis. He did have what looked to be a small pustule that I cultured for CandS although I did not give him empiric antibiotics. 7/22; small area on the left anterior tibia. The area on the left lateral leg still looks inflamed. The pustule that I cultured last ADHAM, BORREGO. ([id number]) week grew Enterobacter and Pseudomonas. I prescribed Cipro they still have not picked that up. In any case the area actually looks fairly satisfactory. He has decent edema control using his compression pumps twice a day 7/29; the area on the left lateral leg looks a lot better than last week. Nothing is open here. He has an area just medial to the mid anterior tibia that is still open that is only open wound today. 8/5-Patient returns at 1 week for the left leg ulcer, which is actually looking better, he is also complaining of left plantar foot pain below the great toe. He does have a hematoma here 8/12-Patient returns at 1 week, the left leg open area looks about the same if not slightly better, the left plantar hematoma under the great toe is about the same, patient apparently has not been resting his foot on anything for too long period of time and does not wear the [employer]kenstock shoes unless he walks outdoors which he does not do often 8/19; small wound on the left anterior lower leg this is just about closed there is nothing on the left lateral leg. He is using his compression pumps He arrives in clinic today with a black eschar over the right first plantar metatarsal head. This was apparently a bruise that was identified by her nurses last week.  He wears crocs on his feet. He also may rub these on a footboard at home 8/26; there is nothing open on the left lateral lower leg he has a small area just medial to the tibia. He is going to see his podiatrist Dr. Milinda Pointer about the area on the left plantar first metatarsal head. He is using his external compression pumps. He has a juxta lite on the right leg 9/2; there is nothing open on either side of her left lower leg. He went to see podiatry about the area on the left first metatarsal head. This was apparently debrided. He was given mupirocin and oral antibiotics. I do not believe he is offloading this at all. Readmission: 04/02/2019 upon evaluation today patient actually appears for reevaluation here in our clinic secondary to issues that he is having with his left lower extremity. He has been tolerating the dressing changes without complication. Fortunately there is no signs of active infection at this time. No fevers, chills, nausea, vomiting, or diarrhea. He has been in the hospital since we last saw him and then  subsequently transferred back to [facility]. He tells me that they have been changing his dressing once a day and that he has had a significant amount of pain secondary to undergoing these dressing changes. Fortunately there is no evidence of active infection at this point which is good news. No fevers, chills, nausea, vomiting, or diarrhea. With that being said he wonders if we can go back to doing what we were doing before which was when he actually had the dressing changes performed once a week here in the clinic he tells me that he did very well with that. At that point he tolerated a 3 layer compression wrap which was approved by vascular, Dr. Tyrell Antonio office, to be used previous although 4-layer compression wrap is probably too much for him. 04/16/2019 on evaluation today patient appears to be doing decently well with regard to his lower extremity although he has had a lot  of drainage from his leg. A lot of this I think is just secondary to the lymphedema with that being said he does have some odor as well which may again just be due to more of the excessive moisture although I think that he may also have something that is a result of bacteria again he has somewhat of a Pseudomonas-like smell to the drainage and fortunately though he is not showing any signs of severe cellulitis he is still experiencing quite a bit of drainage and the discoloration is somewhat of a yellow/green in color which is consistent with Pseudomonas. If he can tolerated I think he may do well with Cipro. 04/24/2019 on evaluation today patient appears to be doing well with regard to his wound in the gluteal region which is healing quite well. That is excellent news. With that being said he is also doing better with regard to his left lower extremity overall I feel like things are definitely showing signs of improvement. 12/28-Patient returns with regards to his gluteal wound that is improving, Left lower leg wounds with significant edema in the lower legs that appear to be persistent. Patient is in compression and has lymphedema pumps at home but apparently he does not use consistently 05/07/2019 Upon inspection patient's wound bed actually showed signs on evaluation today patient's wound currently showed signs of improvement. There is still a lot of maceration but it does look like that there is been some of this maceration occurring as a result of drainage in particular and some as a result of the fact that they have been putting bag balm on the wound area as well as the leg in general. I think this is applying too much moisture and not doing him any good. I discussed with the patient and his wife today that I think that practice needs to be adjusted I would recommend using a different type of lotion in order to help with moisture around the wound but I would not recommend anything directly on  the wound itself. CHRISANGEL, FURFARO ([id number]) Objective Constitutional Well-nourished and well-hydrated in no acute distress. Vitals Time Taken: 1:56 PM, Height: 65 in, Weight: 273 lbs, BMI: 45.4, Temperature: 99.1 F, Pulse: 89 bpm, Respiratory Rate: 20 breaths/min, Blood Pressure: 138/46 mmHg. Respiratory normal breathing without difficulty. Psychiatric this patient is able to make decisions and demonstrates good insight into disease process. Alert and Oriented x 3. pleasant and cooperative. General Notes: Patient's wound bed currently showed signs of fairly good granulation at this time in any areas with a lot of epithelization. There was also  a lot of unfortunately maceration noted as well which is not good at all. I do believe that the bag balm may be playing a role here and I think we need to discontinue that. Otherwise I think that in general no debridement was necessary today and I feel like he is making some progress here. He did have an area of callus on the plantar aspect of his left foot unrelated to anything going on with his wounds that did require some callus paring today. That was discussed with the patient and he did want me to proceed as such. I therefore was able to remove the callus fortunately there was no wound underneath this which is excellent news. I was somewhat concerned there may be something open underneath being that he was having significant pain otherwise based on what I am seeing I think this was just a result of him walking on the hard callus causing discomfort. Integumentary (Hair, Skin) Wound #6 status is Open. Original cause of wound was Gradually Appeared. The wound is located on the Left,Circumferential Lower Leg. The wound measures 11cm length x 28cm width x 0.1cm depth; 241.903cm^2 area and 24.19cm^3 volume. There is Fat Layer (Subcutaneous Tissue) Exposed exposed. There is no tunneling or undermining noted. There is a large amount of serous  drainage noted. The wound margin is indistinct and nonvisible. There is large (67- 100%) pink granulation within the wound bed. There is a small (1-33%) amount of necrotic tissue within the wound bed including Adherent Slough. Assessment Active Problems ICD-10 Lymphedema, not elsewhere classified Venous insufficiency (chronic) (peripheral) Non-pressure chronic ulcer of other part of left lower leg with fat layer exposed Essential (primary) hypertension Atherosclerotic heart disease of native coronary artery without angina pectoris Corns and callosities SAVIER, KADLEC ([id number]) Procedures A Callus Pairing procedure was performed. by STONE III, Drayk Humbarger E., PA-C. Post procedure Diagnosis Wound #: Same as Pre-Procedure Notes: I performed a callus. On the left plantar foot which the patient tolerated today without complication and post paring there does not appear to be any open wound at this time. This location was on the left plantar aspect of the first metatarsal region. Plan Wound Cleansing: Wound #6 Left,Circumferential Lower Leg: May shower with protection. No tub bath. Primary Wound Dressing: Wound #6 Left,Circumferential Lower Leg: XtraSorb Dressing Change Frequency: Wound #6 Left,Circumferential Lower Leg: Change Dressing Monday, Wednesday, Friday - Home Health to change dressings on Wednesdays and Fridays Follow-up Appointments: Wound #6 Left,Circumferential Lower Leg: Return Appointment in 1 week. Nurse Visit as needed Edema Control: Wound #6 Left,Circumferential Lower Leg: 3 Layer Compression System - Left Lower Extremity Off-Loading: Wound #6 Left,Circumferential Lower Leg: Turn and reposition every 2 hours Home Health: Wound #6 Left,Circumferential Lower Leg: [place] Nurse may visit PRN to address patient s wound care needs. FACE TO FACE ENCOUNTER: MEDICARE and MEDICAID PATIENTS: I certify that this patient is under my  care and that I had a face-to-face encounter that meets the physician face-to-face encounter requirements with this patient on this date. The encounter with the patient was in whole or in part for the following MEDICAL CONDITION: (primary reason for [place]) MEDICAL NECESSITY: I certify, that based on my findings, NURSING services are a medically necessary home health service. HOME BOUND STATUS: I certify that my clinical findings support that this patient is homebound (i.e., Due to illness or injury, pt requires aid of supportive devices such as crutches, cane, wheelchairs, walkers, the use of  special transportation or the assistance of another person to leave their place of residence. There is a normal inability to leave the home and doing so requires considerable and taxing effort. Other absences are for medical reasons / religious services and are infrequent or of short duration when for other reasons). If current dressing causes regression in wound condition, may D/C ordered dressing product/s and apply Normal Saline Moist Dressing daily until next [place] / Other MD appointment. [place] of regression in wound condition at [phone]. Please direct any NON-WOUND related issues/requests for orders to patient's Primary Care Physician 1. At this point I would recommend that we go ahead and continue with the current wound care measures which includes the XtraSorb to the wound bed followed by the 3 layer compression wrap. I think this is still the best thing to do as far as helping ELOHIM, VERMETTE. ([id number]) with edema control and hopefully drying out the wounds. He has a lot of new epithelization is just very wet. 2. With regard to the bag balm that is been used I would recommend this not be used and I discussed this with the patient as well as his wife today as well. They are going to hopefully avoid this going forward and use a different type of  lotion but not over any of the wound locations just in the surrounding skin above and below where the wound is. 3. With regard to the left foot I did perform a callus paring in order to clear away some of the callus here for him I was afraid there may be a wound underneath but there was not. He tolerated this overall without complication and the great news is there was no open wound. Overall I am hopeful this will help him to walk more easily without having discomfort. We will see patient back for reevaluation in 1 week here in the clinic. If anything worsens or changes patient will contact our office for additional recommendations. Electronic Signature(s) Signed: 05/09/2019 6:11:07 PM By: Worthy Keeler PA-C Previous Signature: 05/09/2019 9:54:52 AM Version By: Worthy Keeler PA-C Entered By: Worthy Keeler on 05/09/2019 18:11:07 Steven Meza ([id number]) -------------------------------------------------------------------------------- SuperBill Details Patient Name: Steven Meza Date of Service: 05/07/2019 Medical Record Number: [id number] Patient Account Number: [id number] Date of Birth/Sex: 05-Aug-1930 (86 y.o. M) Treating RN: Harold Barban Primary Care Provider: Fulton Reek Other Clinician: Referring Provider: Fulton Reek Treating Provider/Extender: Melburn Hake, Collyns Mcquigg Weeks in Treatment: 5 Diagnosis Coding ICD-10 Codes Meza Description I89.0 Lymphedema, not elsewhere classified I87.2 Venous insufficiency (chronic) (peripheral) L97.822 Non-pressure chronic ulcer of other part of left lower leg with fat layer exposed [place] (primary) hypertension I25.10 Atherosclerotic heart disease of native coronary artery without angina pectoris L84 Corns and callosities Facility Procedures CPT4 Meza: [id number] Description: 99213 - WOUND CARE VISIT-LEV 3 EST PT Modifier: Quantity: 1 CPT4 Meza: [id number] Description: 11055 - PARE BENIGN LES; SGL ICD-10 Diagnosis Description L84  Corns and callosities Modifier: Quantity: 1 Physician Procedures CPT4 Meza Description: [id number] - WC PHYS LEVEL 3 - EST PT ICD-10 Diagnosis Description I89.0 Lymphedema, not elsewhere classified I87.2 Venous insufficiency (chronic) (peripheral) L97.822 Non-pressure chronic ulcer of other part of left lower leg  with L84 Corns and callosities Modifier: 25 fat layer expos Quantity: 1 ed CPT4 Meza Description: [id number] 11055 - WC PHYS PARE BENIGN LES; SGL ICD-10 Diagnosis Description L84 Corns and callosities Modifier: Quantity: 1 Electronic Signature(s) Signed: 05/09/2019 6:11:29 PM By: Worthy Keeler  PA-C Entered By: Worthy Keeler on 05/09/2019 18:11:29

## 2019-05-14 ENCOUNTER — Encounter: Payer: Medicare Other | Admitting: Physician Assistant

## 2019-05-14 ENCOUNTER — Other Ambulatory Visit: Payer: Self-pay

## 2019-05-14 DIAGNOSIS — L97822 Non-pressure chronic ulcer of other part of left lower leg with fat layer exposed: Secondary | ICD-10-CM | POA: Diagnosis not present

## 2019-05-14 NOTE — Progress Notes (Addendum)
Steven Meza, Steven Meza ([id number]) Visit Report for 05/14/2019 Chief Complaint Document Details Patient Name: Steven Meza Date of Service: 05/14/2019 1:45 PM Medical Record Number: [id number] Patient Account Number: [id number] Date of Birth/Sex: 03-18-1931 (86 y.o. M) Treating RN: Steven Meza Primary Care Provider: Fulton Meza Other Clinician: Referring Provider: Fulton Meza Treating Provider/Extender: Steven Meza, Steven Meza Weeks in Treatment: 6 Information Obtained from: Patient Chief Complaint Left LE Ulcer Electronic Signature(s) Signed: 05/14/2019 1:40:48 PM By: Steven Keeler PA-C Entered By: Steven Meza on 05/14/2019 13:40:48 Steven Meza ([id number]) -------------------------------------------------------------------------------- HPI Details Patient Name: Steven Meza Date of Service: 05/14/2019 1:45 PM Medical Record Number: [id number] Patient Account Number: [id number] Date of Birth/Sex: [birth year] (86 y.o. M) Treating RN: Steven Meza Primary Care Provider: Fulton Meza Other Clinician: Referring Provider: Fulton Meza Treating Provider/Extender: Steven Meza, Larae Caison Weeks in Treatment: 6 History of Present Illness HPI Description: ADMISSION 05/17/2018 Mr. Rosener is an 86 year old man who is been treated at the lymphedema clinic for a long period with lymphedema wraps for bilateral lower extremity lymphedema. About 6 months ago they noted small open areas on his left lateral calf just above the ankle. These were open. They are apparently putting some form of ointment on this. They have been referred here for our review of this. The patient has a long history of lymphedema and venous stasis. It says in his records that he has recurrent cellulitis although his wife denies this but she states that the lymphedema may have started with cellulitis several years ago. Also in his record there is a history of bladder cancer which they seem to know very little about however  he did not have any radiation to the pelvis or anything that could have contributed to lymphedema that they are aware of. There is no prior wound history. The patient has 2 small punched out areas on the left lateral calf that have adherent debris on the surface. Past medical history; lymphedema, hypertension, cellulitis, hearing loss, morbid obesity, osteoarthritis, venous stasis, bladder CA, diverticulosis. ABIs in our clinic were 0.36 on the right and 0.57 on the left 05/24/2018; corrected age on this patient is 54 versus what I said last week. He has been for his arterial studies which predictably are not very good. On the right his ABI is 0.65. Monophasic waveforms noted at the right ankle including the posterior tibial and dorsalis pedis. He has triphasic waveforms of the common femoral and profunda femoris triphasic proximal SFA with monophasic distal FSA and popliteal artery. Monophasic tibial waveforms. On the left his ABI is 0.58 monophasic waveforms at the left ankle. Duplex of the left lower extremity demonstrated atherosclerotic change with monophasic waveforms throughout the left lower extremity. There was some concern about left iliac disease and potentially femoral-popliteal and tibial disease. The patient does not describe claudication although according to his wife he over emphasizes his activity. Patient states he is limited by pain in both knees His wounds are on the left lateral calf. 2 small areas with necrotic debris. We have been using silver collagen and light Ace wraps 05/31/2018; wounds on the left lateral calf in the setting of very significant lymphedema and probably PAD. We have been using silver collagen. The base of the small wound looks reasonably improved. I sent him to see Steven Meza however I see now he has an appointment with Steven Meza on February 12 2/5; left lateral calf wound looks the same. His wife is doing a good job with her lymphedema wraps and  maintaining that the edema. He most likely has PAD and is due to see Steven Meza of infectious disease on February 12 2/19; left lateral calf wounds look about the same. This looks like wound secondary to chronic venous insufficiency with lymphedema however the patient has very poor arterial studies. We referred him to Steven Meza. Steven Meza did not feel he needed to do anything from an arterial point of view. He did not address the question about the aggressiveness of compression. After some thoughts about this I elected to go ahead and put him in 3 layer compression which after all would be less compression then the lymphedema clinic was putting on this. I am hopeful that this should be enough to get some closure of the small wounds 2/26; left lateral calf wound letter this week. He tolerated the 3 layer compression well furthermore he is sleeping in a hospital bed which helps keep his legs up at night. The wound looks better measuring smaller especially in width 3/3; left lateral calf wound about the same size. The 3 layer compression has really helped get the edema down in his leg however. Using silver collagen 3/11; left lateral calf wound better looking wound surface but about the same size. We have been using 3 layer compression which is really helped get the edema down in the left leg. Even after Steven Meza assessment I am reluctant to go to 4 layer compression. He is tolerating 3 layer compression well. We have been using silver collagen Steven Meza, Steven Meza ([id number]) 3/18-Patient returns for his left lateral calf wound which overall is looking better, slightly increased in size and dimensions and area, he is tolerating the 3 layer compression, has been dressed with silver collagen which we will continue 3/25oleft lateral calf wound much better looking. Size is smaller. He has been using silver collagen 4/1; gradually getting smaller in size. Using silver collagen with 3 layer compression. I  think the patient has some degree of PAD. He saw Steven Meza in consultation, he did not specifically comment on whether he could tolerate 4 layer compression 4/8; the wound is slightly smaller in depth. We are using 3 layer compression with silver collagen. I had him seen for arterial insufficiency however his ABI was 0.57 in the left leg. Steven Meza really did not seem to middle on the degree of compression he could tolerate 4/15; the wound is slightly smaller and slightly less deep. We have been using 3 layer compression with silver collagen. There is not an option for 4-layer compression here. 4/22; the patient is making decent progress on his wound on the left lateral calf however he arrives in with a superficial wound on the right anterior tibial area. Were not really sure how this happened. He had been using a stocking on the right leg. He has been using silver collagen on the wounds Amazing to me he is he appears to have compression pumps at home. I am not really sure I knew this before. In any case he has not been using them 4/29; left lateral calf wound continues to get gradually smaller. This looks like it is on its way to closure. oThe area on the right anterior tibial area is about the same in terms of size superficial without any depth. This was probably wrap injury oHe tells me that he has been using his compression pumps once a day for 1 hour without any pain 5/6; not as good today. Left lateral calf wound is actually larger and  he does not have quite as good edema control. The area on the right anterior tibia is about the same still superficial. He states he is using his compression pumps once a day. We are using 3 layer compression on the left and Curlex Coban on the right 5/13; the area on the right is very close to being healed. The area on the left still has some depth but has a healthy wound surface. He has excellent edema control bilaterally. Using Hydrofera Blue 5/20-Patient  returns in 1 week after being in 3 layer compression on the left, the left lateral calf ulcer is stable with some slough, the right anterior tibial ulcer is healed we have been using Kerlix Coban on that leg 5/27; Hydrofera Blue and 3 layer compression. Still some slough in the wound area and some nonviable edges around the wound. 6/3; this patient has lymphedema and chronic venous insufficiency. He had difficult to close wound on his left lateral calf. This is closed today. 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 Finally he has known PAD but he is managed to tolerate the compression we reporting on him and also using his compression pumps that he already had at home twice a day 6/10; we discharge this patient last week having finally close the small area in the left lateral calf. Apparently by Friday of last week this had opened and was draining. He is therefore back in clinic. His wife who is present also was concerned about an area on the right anterior tibia."o 6/17; the patient does not have an open area on the right leg. On the left he has a small circular area anteriorly and the same area laterally that he had last time. However both of these appear to be improved he has his Farrow wrap 30/40. It turns out that he is only using his external compression pumps once a day. I tried to get him up to twice a day today 6/24; the patient comes in with his external compression garment on the right leg/Farrow wrap. As far as we know no open wounds here. He has 2 areas that are smaller one on the left anterior pretibial and one on the left lateral calf which is the site of his initial wounds. I have successfully caught him into his external compression pumps twice a day 7/1; still using his external compression garment on the right leg. As far as we know there are no open wounds here. The area on the left lateral calf which was the site of his initial wound has  closed over. The area on the left anterior pretibial that we identified last week is still open. 7/8; as far as we know he has no open wounds on the right leg. He has his external compression garment on here. The original wound on the left lateral calf has closed over. He developed an area on the left anterior pretibial 2 weeks ago and seems to have 2 smaller areas on the proximal and distal lateral calf. We do not have his good edema control this week as I am used to seeing 7/15; the patient has still a linear area on the left anterior tibia. The area on the left lateral leg looked somewhat inflamed today but no cellulitis. He did have what looked to be a small pustule that I cultured for CandS although I did not give him empiric antibiotics. 7/22; small area on the left anterior tibia. The area on the left  lateral leg still looks inflamed. The pustule that I cultured last week grew Enterobacter and Pseudomonas. I prescribed Cipro they still have not picked that up. In any case the area actually looks fairly satisfactory. He has decent edema control using his compression pumps twice a day 7/29; the area on the left lateral leg looks a lot better than last week. Nothing is open here. He has an area just medial to the mid anterior tibia that is still open that is only open wound today. 8/5-Patient returns at 1 week for the left leg ulcer, which is actually looking better, he is also complaining of left plantar foot pain below the great toe. He does have a hematoma here Steven Meza, Steven Meza ([id number]) 8/12-Patient returns at 1 week, the left leg open area looks about the same if not slightly better, the left plantar hematoma under the great toe is about the same, patient apparently has not been resting his foot on anything for too long period of time and does not wear the Birkenstock shoes unless he walks outdoors which he does not do often 8/19; small wound on the left anterior lower leg this is  just about closed there is nothing on the left lateral leg. He is using his compression pumps He arrives in clinic today with a black eschar over the right first plantar metatarsal head. This was apparently a bruise that was identified by her nurses last week. He wears crocs on his feet. He also may rub these on a footboard at home 8/26; there is nothing open on the left lateral lower leg he has a small area just medial to the tibia. He is going to see his podiatrist Dr. Milinda Pointer about the area on the left plantar first metatarsal head. He is using his external compression pumps. He has a juxta lite on the right leg 9/2; there is nothing open on either side of her left lower leg. He went to see podiatry about the area on the left first metatarsal head. This was apparently debrided. He was given mupirocin and oral antibiotics. I do not believe he is offloading this at all. Readmission: 04/02/2019 upon evaluation today patient actually appears for reevaluation here in our clinic secondary to issues that he is having with his left lower extremity. He has been tolerating the dressing changes without complication. Fortunately there is no signs of active infection at this time. No fevers, chills, nausea, vomiting, or diarrhea. He has been in the hospital since we last saw him and then subsequently transferred back to [facility]. He tells me that they have been changing his dressing once a day and that he has had a significant amount of pain secondary to undergoing these dressing changes. Fortunately there is no evidence of active infection at this point which is good news. No fevers, chills, nausea, vomiting, or diarrhea. With that being said he wonders if we can go back to doing what we were doing before which was when he actually had the dressing changes performed once a week here in the clinic he tells me that he did very well with that. At that point he tolerated a 3 layer compression wrap which  was approved by vascular, Dr. Tyrell Antonio office, to be used previous although 4-layer compression wrap is probably too much for him. 04/16/2019 on evaluation today patient appears to be doing decently well with regard to his lower extremity although he has had a lot of drainage from his leg. A lot of this I  think is just secondary to the lymphedema with that being said he does have some odor as well which may again just be due to more of the excessive moisture although I think that he may also have something that is a result of bacteria again he has somewhat of a Pseudomonas-like smell to the drainage and fortunately though he is not showing any signs of severe cellulitis he is still experiencing quite a bit of drainage and the discoloration is somewhat of a yellow/green in color which is consistent with Pseudomonas. If he can tolerated I think he may do well with Cipro. 04/24/2019 on evaluation today patient appears to be doing well with regard to his wound in the gluteal region which is healing quite well. That is excellent news. With that being said he is also doing better with regard to his left lower extremity overall I feel like things are definitely showing signs of improvement. 12/28-Patient returns with regards to his gluteal wound that is improving, Left lower leg wounds with significant edema in the lower legs that appear to be persistent. Patient is in compression and has lymphedema pumps at home but apparently he does not use consistently 05/07/2019 Upon inspection patient's wound bed actually showed signs on evaluation today patient's wound currently showed signs of improvement. There is still a lot of maceration but it does look like that there is been some of this maceration occurring as a result of drainage in particular and some as a result of the fact that they have been putting bag balm on the wound area as well as the leg in general. I think this is applying too much moisture and  not doing him any good. I discussed with the patient and his wife today that I think that practice needs to be adjusted I would recommend using a different type of lotion in order to help with moisture around the wound but I would not recommend anything directly on the wound itself. 05/14/2019 on evaluation today patient actually appears to be doing much better in regard to his lower extremity. They have not been using any of the bag balm on the area I think this has made a tremendous improvement overall in his wounds. He has a lot of new skin growth and though there still are a lot of open wounds scattered throughout the circumferential lower leg this overall seems to be doing much better to me. Electronic Signature(s) Signed: 05/14/2019 2:22:53 PM By: Steven Keeler PA-C Entered By: Steven Meza on 05/14/2019 14:22:52 Steven Meza ([id number] Meza ([id number]) -------------------------------------------------------------------------------- Physical Exam Details Patient Name: Steven Meza Date of Service: 05/14/2019 1:45 PM Medical Record Number: [id number] Patient Account Number: [id number] Date of Birth/Sex: April 18, 1931 (86 y.o. M) Treating RN: Steven Meza Primary Care Provider: Fulton Meza Other Clinician: Referring Provider: Fulton Meza Treating Provider/Extender: Steven Meza, Railynn Ballo Weeks in Treatment: 6 Constitutional Well-nourished and well-hydrated in no acute distress. Respiratory normal breathing without difficulty. Psychiatric this patient is able to make decisions and demonstrates good insight into disease process. Alert and Oriented x 3. pleasant and cooperative. Notes His wound bed currently showed signs of good granulation at this time and excellent epithelization in a lot of areas. Overall he is been tolerating the dressing changes without complication and very pleased with how things seem to be progressing. No sharp debridement was necessary  today. Electronic Signature(s) Signed: 05/14/2019 2:23:29 PM By: Steven Keeler PA-C Entered By: Steven Meza on 05/14/2019 14:23:29 Pal,  Epifania Gore ([id number]) -------------------------------------------------------------------------------- Physician Orders Details Patient Name: Steven Meza Date of Service: 05/14/2019 1:45 PM Medical Record Number: [id number] Patient Account Number: [id number] Date of Birth/Sex: 29-Apr-1931 (86 y.o. M) Treating RN: Steven Meza Primary Care Provider: Fulton Meza Other Clinician: Referring Provider: Fulton Meza Treating Provider/Extender: Steven Meza, Akeira Lahm Weeks in Treatment: 6 Verbal / Phone Orders: No Diagnosis Coding ICD-10 Coding Meza Description I89.0 Lymphedema, not elsewhere classified I87.2 Venous insufficiency (chronic) (peripheral) L97.822 Non-pressure chronic ulcer of other part of left lower leg with fat layer exposed I10 Essential (primary) hypertension I25.10 Atherosclerotic heart disease of native coronary artery without angina pectoris L84 Corns and callosities Wound Cleansing Wound #6 Left,Circumferential Lower Leg o May shower with protection. o No tub bath. Primary Wound Dressing Wound #6 Left,Circumferential Lower Leg o Silver Alginate Secondary Dressing Wound #6 Left,Circumferential Lower Leg o ABD pad Dressing Change Frequency Wound #6 Left,Circumferential Lower Leg o Change Dressing Monday, Wednesday, Friday - Home Health to change dressings on Wednesdays and Fridays Follow-up Appointments Wound #6 Left,Circumferential Lower Leg o Return Appointment in 1 week. o Nurse Visit as needed Edema Control Wound #6 Left,Circumferential Lower Leg o 3 Layer Compression System - Left Lower Extremity Off-Loading Wound #6 Left,Circumferential Lower Leg o Turn and reposition every 2 hours Home Health RIGHTEOUS, [place] ([id number]) Wound #6 Left,Circumferential Lower Leg o [place]  [facility]its - [facility] o Home Health Nurse may visit PRN to address patientos wound care needs. o FACE TO FACE ENCOUNTER: MEDICARE and MEDICAID PATIENTS: I certify that this patient is under my care and that I had a face-to-face encounter that meets the physician face-to-face encounter requirements with this patient on this date. The encounter with the patient was in whole or in part for the following MEDICAL CONDITION: (primary reason for [place]) MEDICAL NECESSITY: I certify, that based on my findings, NURSING services are a medically necessary home health service. HOME BOUND STATUS: I certify that my clinical findings support that this patient is homebound (i.e., Due to illness or injury, pt requires aid of supportive devices such as crutches, cane, wheelchairs, walkers, the use of special transportation or the assistance of another person to leave their place of residence. There is a normal inability to leave the home and doing so requires considerable and taxing effort. Other absences are for medical reasons / religious services and are infrequent or of short duration when for other reasons). o If current dressing causes regression in wound condition, may D/C ordered dressing product/s and apply Normal Saline Moist Dressing daily until next [place] / Other MD appointment. [place] of regression in wound condition at [phone]. o Please direct any NON-WOUND related issues/requests for orders to patient's Primary Care Physician Electronic Signature(s) Signed: 05/14/2019 4:22:40 PM By: Steven Keeler PA-C Signed: 05/14/2019 5:00:56 PM By: Gretta Cool, BSN, RN, CWS, Kim RN, BSN Entered By: Gretta Cool, BSN, RN, CWS, Kim on 05/14/2019 14:22:21 Steven Meza ([id number]) -------------------------------------------------------------------------------- Problem List Details Patient Name: Steven Meza Date of Service: 05/14/2019 1:45 PM Medical Record  Number: [id number] Patient Account Number: [id number] Date of Birth/Sex: 1930-07-20 (86 y.o. M) Treating RN: Steven Meza Primary Care Provider: Fulton Meza Other Clinician: Referring Provider: Fulton Meza Treating Provider/Extender: Steven Meza, Chantell Kunkler Weeks in Treatment: 6 Active Problems ICD-10 Evaluated Encounter Meza Description Active Date Today Diagnosis I89.0 Lymphedema, not elsewhere classified 04/02/2019 No Yes I87.2 Venous insufficiency (chronic) (peripheral) 04/02/2019 No Yes L97.822 Non-pressure chronic ulcer of other part of left lower  leg with 04/02/2019 No Yes fat layer exposed I10 Essential (primary) hypertension 04/02/2019 No Yes I25.10 Atherosclerotic heart disease of native coronary artery 04/02/2019 No Yes without angina pectoris L84 Corns and callosities 05/09/2019 No Yes Inactive Problems Resolved Problems Electronic Signature(s) Signed: 05/14/2019 1:40:42 PM By: Steven Keeler PA-C Entered By: Steven Meza on 05/14/2019 13:40:42 Steven Meza ([id number]) -------------------------------------------------------------------------------- Progress Note Details Patient Name: Steven Meza Date of Service: 05/14/2019 1:45 PM Medical Record Number: [id number] Patient Account Number: [id number] Date of Birth/Sex: [birth year] (86 y.o. M) Treating RN: Steven Meza Primary Care Provider: Fulton Meza Other Clinician: Referring Provider: Fulton Meza Treating Provider/Extender: Steven Meza, Wania Longstreth Weeks in Treatment: 6 Subjective Chief Complaint Information obtained from Patient Left LE Ulcer History of Present Illness (HPI) ADMISSION 05/17/2018 Mr. Thibodeau is an 86 year old man who is been treated at the lymphedema clinic for a long period with lymphedema wraps for bilateral lower extremity lymphedema. About 6 months ago they noted small open areas on his left lateral calf just above the ankle. These were open. They are apparently putting some form of  ointment on this. They have been referred here for our review of this. The patient has a long history of lymphedema and venous stasis. It says in his records that he has recurrent cellulitis although his wife denies this but she states that the lymphedema may have started with cellulitis several years ago. Also in his record there is a history of bladder cancer which they seem to know very little about however he did not have any radiation to the pelvis or anything that could have contributed to lymphedema that they are aware of. There is no prior wound history. The patient has 2 small punched out areas on the left lateral calf that have adherent debris on the surface. Past medical history; lymphedema, hypertension, cellulitis, hearing loss, morbid obesity, osteoarthritis, venous stasis, bladder CA, diverticulosis. ABIs in our clinic were 0.36 on the right and 0.57 on the left 05/24/2018; corrected age on this patient is 36 versus what I said last week. He has been for his arterial studies which predictably are not very good. On the right his ABI is 0.65. Monophasic waveforms noted at the right ankle including the posterior tibial and dorsalis pedis. He has triphasic waveforms of the common femoral and profunda femoris triphasic proximal SFA with monophasic distal FSA and popliteal artery. Monophasic tibial waveforms. On the left his ABI is 0.58 monophasic waveforms at the left ankle. Duplex of the left lower extremity demonstrated atherosclerotic change with monophasic waveforms throughout the left lower extremity. There was some concern about left iliac disease and potentially femoral-popliteal and tibial disease. The patient does not describe claudication although according to his wife he over emphasizes his activity. Patient states he is limited by pain in both knees His wounds are on the left lateral calf. 2 small areas with necrotic debris. We have been using silver collagen and light  Ace wraps 05/31/2018; wounds on the left lateral calf in the setting of very significant lymphedema and probably PAD. We have been using silver collagen. The base of the small wound looks reasonably improved. I sent him to see Steven Meza however I see now he has an appointment with Steven Meza on February 12 2/5; left lateral calf wound looks the same. His wife is doing a good job with her lymphedema wraps and maintaining that the edema. He most likely has PAD and is due to see Steven Meza of infectious disease on  February 12 2/19; left lateral calf wounds look about the same. This looks like wound secondary to chronic venous insufficiency with lymphedema however the patient has very poor arterial studies. We referred him to Steven Meza. Steven Meza did not feel he needed to do anything from an arterial point of view. He did not address the question about the aggressiveness of compression. After some thoughts about this I elected to go ahead and put him in 3 layer compression which after all would be less compression then the lymphedema clinic was putting on this. I am hopeful that this should be enough to get some closure of the small wounds 2/26; left lateral calf wound letter this week. He tolerated the 3 layer compression well furthermore he is sleeping in a hospital Steven Meza, Steven A. ([id number]) bed which helps keep his legs up at night. The wound looks better measuring smaller especially in width 3/3; left lateral calf wound about the same size. The 3 layer compression has really helped get the edema down in his leg however. Using silver collagen 3/11; left lateral calf wound better looking wound surface but about the same size. We have been using 3 layer compression which is really helped get the edema down in the left leg. Even after Steven Meza assessment I am reluctant to go to 4 layer compression. He is tolerating 3 layer compression well. We have been using silver collagen 3/18-Patient returns  for his left lateral calf wound which overall is looking better, slightly increased in size and dimensions and area, he is tolerating the 3 layer compression, has been dressed with silver collagen which we will continue 3/25 left lateral calf wound much better looking. Size is smaller. He has been using silver collagen 4/1; gradually getting smaller in size. Using silver collagen with 3 layer compression. I think the patient has some degree of PAD. He saw Steven Meza in consultation, he did not specifically comment on whether he could tolerate 4 layer compression 4/8; the wound is slightly smaller in depth. We are using 3 layer compression with silver collagen. I had him seen for arterial insufficiency however his ABI was 0.57 in the left leg. Steven Meza really did not seem to middle on the degree of compression he could tolerate 4/15; the wound is slightly smaller and slightly less deep. We have been using 3 layer compression with silver collagen. There is not an option for 4-layer compression here. 4/22; the patient is making decent progress on his wound on the left lateral calf however he arrives in with a superficial wound on the right anterior tibial area. Were not really sure how this happened. He had been using a stocking on the right leg. He has been using silver collagen on the wounds Amazing to me he is he appears to have compression pumps at home. I am not really sure I knew this before. In any case he has not been using them 4/29; left lateral calf wound continues to get gradually smaller. This looks like it is on its way to closure. The area on the right anterior tibial area is about the same in terms of size superficial without any depth. This was probably wrap injury He tells me that he has been using his compression pumps once a day for 1 hour without any pain 5/6; not as good today. Left lateral calf wound is actually larger and he does not have quite as good edema control. The  area on the right anterior tibia is about  the same still superficial. He states he is using his compression pumps once a day. We are using 3 layer compression on the left and Curlex Coban on the right 5/13; the area on the right is very close to being healed. The area on the left still has some depth but has a healthy wound surface. He has excellent edema control bilaterally. Using Hydrofera Blue 5/20-Patient returns in 1 week after being in 3 layer compression on the left, the left lateral calf ulcer is stable with some slough, the right anterior tibial ulcer is healed we have been using Kerlix Coban on that leg 5/27; Hydrofera Blue and 3 layer compression. Still some slough in the wound area and some nonviable edges around the wound. 6/3; this patient has lymphedema and chronic venous insufficiency. He had difficult to close wound on his left lateral calf. This is closed today. 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 Finally he has known PAD but he is managed to tolerate the compression we reporting on him and also using his compression pumps that he already had at home twice a day 6/10; we discharge this patient last week having finally close the small area in the left lateral calf. Apparently by Friday of last week this had opened and was draining. He is therefore back in clinic. His wife who is present also was concerned about an area on the right anterior tibia."o 6/17; the patient does not have an open area on the right leg. On the left he has a small circular area anteriorly and the same area laterally that he had last time. However both of these appear to be improved he has his Farrow wrap 30/40. It turns out that he is only using his external compression pumps once a day. I tried to get him up to twice a day today 6/24; the patient comes in with his external compression garment on the right leg/Farrow wrap. As far as we know no open wounds here.  He has 2 areas that are smaller one on the left anterior pretibial and one on the left lateral calf which is the site of his initial wounds. I have successfully caught him into his external compression pumps twice a day 7/1; still using his external compression garment on the right leg. As far as we know there are no open wounds here. The area on the left lateral calf which was the site of his initial wound has closed over. The area on the left anterior pretibial that we identified last week is still open. 7/8; as far as we know he has no open wounds on the right leg. He has his external compression garment on here. The original wound on the left lateral calf has closed over. He developed an area on the left anterior pretibial 2 weeks ago and seems to have 2 smaller areas on the proximal and distal lateral calf. We do not have his good edema control this week as I am used to seeing 7/15; the patient has still a linear area on the left anterior tibia. The area on the left lateral leg looked somewhat inflamed today but no cellulitis. He did have what looked to be a small pustule that I cultured for CandS although I did not give him empiric antibiotics. 7/22; small area on the left anterior tibia. The area on the left lateral leg still looks inflamed. The pustule that I cultured last Steven Meza, YELEY. ([id number]) week grew Enterobacter and  Pseudomonas. I prescribed Cipro they still have not picked that up. In any case the area actually looks fairly satisfactory. He has decent edema control using his compression pumps twice a day 7/29; the area on the left lateral leg looks a lot better than last week. Nothing is open here. He has an area just medial to the mid anterior tibia that is still open that is only open wound today. 8/5-Patient returns at 1 week for the left leg ulcer, which is actually looking better, he is also complaining of left plantar foot pain below the great toe. He does have a  hematoma here 8/12-Patient returns at 1 week, the left leg open area looks about the same if not slightly better, the left plantar hematoma under the great toe is about the same, patient apparently has not been resting his foot on anything for too long period of time and does not wear the Birkenstock shoes unless he walks outdoors which he does not do often 8/19; small wound on the left anterior lower leg this is just about closed there is nothing on the left lateral leg. He is using his compression pumps He arrives in clinic today with a black eschar over the right first plantar metatarsal head. This was apparently a bruise that was identified by her nurses last week. He wears crocs on his feet. He also may rub these on a footboard at home 8/26; there is nothing open on the left lateral lower leg he has a small area just medial to the tibia. He is going to see his podiatrist Dr. Milinda Pointer about the area on the left plantar first metatarsal head. He is using his external compression pumps. He has a juxta lite on the right leg 9/2; there is nothing open on either side of her left lower leg. He went to see podiatry about the area on the left first metatarsal head. This was apparently debrided. He was given mupirocin and oral antibiotics. I do not believe he is offloading this at all. Readmission: 04/02/2019 upon evaluation today patient actually appears for reevaluation here in our clinic secondary to issues that he is having with his left lower extremity. He has been tolerating the dressing changes without complication. Fortunately there is no signs of active infection at this time. No fevers, chills, nausea, vomiting, or diarrhea. He has been in the hospital since we last saw him and then subsequently transferred back to [facility]. He tells me that they have been changing his dressing once a day and that he has had a significant amount of pain secondary to undergoing these dressing changes.  Fortunately there is no evidence of active infection at this point which is good news. No fevers, chills, nausea, vomiting, or diarrhea. With that being said he wonders if we can go back to doing what we were doing before which was when he actually had the dressing changes performed once a week here in the clinic he tells me that he did very well with that. At that point he tolerated a 3 layer compression wrap which was approved by vascular, Dr. Tyrell Antonio office, to be used previous although 4-layer compression wrap is probably too much for him. 04/16/2019 on evaluation today patient appears to be doing decently well with regard to his lower extremity although he has had a lot of drainage from his leg. A lot of this I think is just secondary to the lymphedema with that being said he does have some odor as well which  may again just be due to more of the excessive moisture although I think that he may also have something that is a result of bacteria again he has somewhat of a Pseudomonas-like smell to the drainage and fortunately though he is not showing any signs of severe cellulitis he is still experiencing quite a bit of drainage and the discoloration is somewhat of a yellow/green in color which is consistent with Pseudomonas. If he can tolerated I think he may do well with Cipro. 04/24/2019 on evaluation today patient appears to be doing well with regard to his wound in the gluteal region which is healing quite well. That is excellent news. With that being said he is also doing better with regard to his left lower extremity overall I feel like things are definitely showing signs of improvement. 12/28-Patient returns with regards to his gluteal wound that is improving, Left lower leg wounds with significant edema in the lower legs that appear to be persistent. Patient is in compression and has lymphedema pumps at home but apparently he does not use consistently 05/07/2019 Upon inspection patient's  wound bed actually showed signs on evaluation today patient's wound currently showed signs of improvement. There is still a lot of maceration but it does look like that there is been some of this maceration occurring as a result of drainage in particular and some as a result of the fact that they have been putting bag balm on the wound area as well as the leg in general. I think this is applying too much moisture and not doing him any good. I discussed with the patient and his wife today that I think that practice needs to be adjusted I would recommend using a different type of lotion in order to help with moisture around the wound but I would not recommend anything directly on the wound itself. 05/14/2019 on evaluation today patient actually appears to be doing much better in regard to his lower extremity. They have not been using any of the bag balm on the area I think this has made a tremendous improvement overall in his wounds. He has a lot of new skin growth and though there still are a lot of open wounds scattered throughout the circumferential lower leg this overall seems to be doing much better to me. Steven Meza, Steven Meza ([id number]) Objective Constitutional Well-nourished and well-hydrated in no acute distress. Vitals Time Taken: 1:42 PM, Height: 65 in, Weight: 273 lbs, BMI: 45.4, Temperature: 98.0 F, Pulse: 75 bpm, Respiratory Rate: 18 breaths/min, Blood Pressure: 154/59 mmHg. Respiratory normal breathing without difficulty. Psychiatric this patient is able to make decisions and demonstrates good insight into disease process. Alert and Oriented x 3. pleasant and cooperative. General Notes: His wound bed currently showed signs of good granulation at this time and excellent epithelization in a lot of areas. Overall he is been tolerating the dressing changes without complication and very pleased with how things seem to be progressing. No sharp debridement was necessary  today. Integumentary (Hair, Skin) Wound #6 status is Open. Original cause of wound was Gradually Appeared. The wound is located on the Left,Circumferential Lower Leg. The wound measures 15cm length x 32cm width x 0.1cm depth; 376.991cm^2 area and 37.699cm^3 volume. There is Fat Layer (Subcutaneous Tissue) Exposed exposed. There is no tunneling or undermining noted. There is a large amount of serous drainage noted. The wound margin is indistinct and nonvisible. There is large (67- 100%) pink granulation within the wound bed. There is a small (1-33%)  amount of necrotic tissue within the wound bed including Adherent Slough. Assessment Active Problems ICD-10 Lymphedema, not elsewhere classified Venous insufficiency (chronic) (peripheral) Non-pressure chronic ulcer of other part of left lower leg with fat layer exposed Essential (primary) hypertension Atherosclerotic heart disease of native coronary artery without angina pectoris Corns and callosities Steven Meza, Steven Meza ([id number]) Procedures Wound #6 Pre-procedure diagnosis of Wound #6 is a Venous Leg Ulcer located on the Left,Circumferential Lower Leg . There was a Three Layer Compression Therapy Procedure by Steven Barman, RN. Post procedure Diagnosis Wound #6: Same as Pre-Procedure Plan Wound Cleansing: Wound #6 Left,Circumferential Lower Leg: May shower with protection. No tub bath. Primary Wound Dressing: Wound #6 Left,Circumferential Lower Leg: Silver Alginate Secondary Dressing: Wound #6 Left,Circumferential Lower Leg: ABD pad Dressing Change Frequency: Wound #6 Left,Circumferential Lower Leg: Change Dressing Monday, Wednesday, Friday - Home Health to change dressings on Wednesdays and Fridays Follow-up Appointments: Wound #6 Left,Circumferential Lower Leg: Return Appointment in 1 week. Nurse Visit as needed Edema Control: Wound #6 Left,Circumferential Lower Leg: 3 Layer Compression System - Left Lower  Extremity Off-Loading: Wound #6 Left,Circumferential Lower Leg: Turn and reposition every 2 hours Home Health: Wound #6 Left,Circumferential Lower Leg: [place] Nurse may visit PRN to address patient s wound care needs. FACE TO FACE ENCOUNTER: MEDICARE and MEDICAID PATIENTS: I certify that this patient is under my care and that I had a face-to-face encounter that meets the physician face-to-face encounter requirements with this patient on this date. The encounter with the patient was in whole or in part for the following MEDICAL CONDITION: (primary reason for [place]) MEDICAL NECESSITY: I certify, that based on my findings, NURSING services are a medically necessary home health service. HOME BOUND STATUS: I certify that my clinical findings support that this patient is homebound (i.e., Due to illness or injury, pt requires aid of supportive devices such as crutches, cane, wheelchairs, walkers, the use of special transportation or the assistance of another person to leave their place of residence. There is a normal inability to leave the home and doing so requires considerable and taxing effort. Other absences are for medical reasons / religious services and are infrequent or of short duration when for other reasons). If current dressing causes regression in wound condition, may D/C ordered dressing product/s and apply Normal Saline Moist Dressing daily until next [place] / Other MD appointment. [place] of regression in wound condition at [phone]. Please direct any NON-WOUND related issues/requests for orders to patient's Primary Care Physician Steven Meza, Steven Meza ([id number]) 1. I would recommend currently that we go ahead and continue with the current wound care measures which includes the 3 layer compression wrap on the left lower extremity. 2. I am also can recommend that we discontinue the Xtrasorb and  we will initiate treatment with a silver alginate dressing. Up to this point have been mostly concerned with trying to manage his drainage although he seems to be doing much better at this point I think the alginate will be sufficient and not get too saturated and I believe ABD pads over top will be sufficient following. 3. We will continue to have home health come out to evaluate and take care of him as well. 4. I did look at his gluteal region there still is nothing open at any of these locations and he seems to be doing quite well at this time. There was just a protective dressing over this. We  will see patient back for reevaluation in 1 week here in the clinic. If anything worsens or changes patient will contact our office for additional recommendations. Electronic Signature(s) Signed: 05/14/2019 2:24:14 PM By: Steven Keeler PA-C Entered By: Steven Meza on 05/14/2019 14:24:14 Steven Meza ([id number]) -------------------------------------------------------------------------------- SuperBill Details Patient Name: Steven Meza Date of Service: 05/14/2019 Medical Record Number: [id number] Patient Account Number: [id number] Date of Birth/Sex: January 31, 1931 (86 y.o. M) Treating RN: Steven Meza Primary Care Provider: Fulton Meza Other Clinician: Referring Provider: Fulton Meza Treating Provider/Extender: Steven Meza, Artur Winningham Weeks in Treatment: 6 Diagnosis Coding ICD-10 Codes Meza Description I89.0 Lymphedema, not elsewhere classified I87.2 Venous insufficiency (chronic) (peripheral) L97.822 Non-pressure chronic ulcer of other part of left lower leg with fat layer exposed I10 Essential (primary) hypertension I25.10 Atherosclerotic heart disease of native coronary artery without angina pectoris L84 Corns and callosities Facility Procedures CPT4 Meza: [id number] Description: (Facility Use Only) [phone] - [place] LWR LT LEG Modifier: Quantity: 1 Physician  Procedures CPT4 Meza Description: [id number] - WC PHYS LEVEL 3 - EST PT ICD-10 Diagnosis Description I89.0 Lymphedema, not elsewhere classified I87.2 Venous insufficiency (chronic) (peripheral) L97.822 Non-pressure chronic ulcer of other part of left lower leg  wit I10 Essential (primary) hypertension Modifier: h fat layer expos Quantity: 1 ed Electronic Signature(s) Signed: 05/14/2019 2:24:28 PM By: Steven Keeler PA-C Entered By: Steven Meza on 05/14/2019 14:24:28

## 2019-05-14 NOTE — Progress Notes (Addendum)
ALDIS, LAMORTE ([id number]) Visit Report for 05/14/2019 Arrival Information Details Patient Name: Steven Meza, Steven Meza Date of Service: 05/14/2019 1:45 PM Medical Record Number: [id number] Patient Account Number: [id number] Date of Birth/Sex: [birth year] (86 y.o. M) Treating RN: Cornell Barman Primary Care Shaquandra Galano: Fulton Reek Other Clinician: Referring Niquita Digioia: Fulton Reek Treating Jordin Vicencio/Extender: Melburn Hake, HOYT Weeks in Treatment: 6 Visit Information History Since Last Visit Added or deleted any medications: No Patient Arrived: [name] Any new allergies or adverse reactions: No Arrival Time: 13:41 Had a fall or experienced change in No Accompanied By: wife activities of daily living that may affect Transfer Assistance: None risk of falls: Secondary Verification Process Completed: Yes Signs or symptoms of abuse/neglect since last visito No Hospitalized since last visit: No Implantable device outside of the clinic excluding No cellular tissue based products placed in the center since last visit: Has Dressing in Place as Prescribed: Yes Has Compression in Place as Prescribed: Yes Pain Present Now: No Electronic Signature(s) Signed: 05/14/2019 4:18:53 PM By: Lorine Bears RCP, RRT, CHT Entered By: Lorine Bears on 05/14/2019 13:41:42 Steven Meza ([id number]) -------------------------------------------------------------------------------- Compression Therapy Details Patient Name: Steven Meza Date of Service: 05/14/2019 1:45 PM Medical Record Number: [id number] Patient Account Number: [id number] Date of Birth/Sex: [birth year] (86 y.o. M) Treating RN: Cornell Barman Primary Care Marijose Curington: Fulton Reek Other Clinician: Referring Jacob Chamblee: Fulton Reek Treating Aydan Levitz/Extender: Melburn Hake, HOYT Weeks in Treatment: 6 Compression Therapy Performed for Wound Assessment: Wound #6 Left,Circumferential Lower Leg Performed By: Clinician Cornell Barman, RN Compression Type: Three Layer Post Procedure Diagnosis Same as Pre-procedure Electronic Signature(s) Signed: 05/14/2019 5:00:56 PM By: Gretta Cool, BSN, RN, CWS, Kim RN, BSN Entered By: Gretta Cool, BSN, RN, CWS, Kim on 05/14/2019 14:21:34 Steven Meza ([id number]) -------------------------------------------------------------------------------- Encounter Discharge Information Details Patient Name: Steven Meza Date of Service: 05/14/2019 1:45 PM Medical Record Number: [id number] Patient Account Number: [id number] Date of Birth/Sex: 11/01/1930 (86 y.o. M) Treating RN: Cornell Barman Primary Care Billy Rocco: Fulton Reek Other Clinician: Referring Aprile Dickenson: Fulton Reek Treating Kobey Sides/Extender: Melburn Hake, HOYT Weeks in Treatment: 6 Encounter Discharge Information Items Discharge Condition: Stable Ambulatory Status: Walker Discharge Destination: Home Transportation: Private Auto Accompanied By: spouse Schedule Follow-up Appointment: Yes Clinical Summary of Care: Electronic Signature(s) Signed: 05/14/2019 5:00:56 PM By: Gretta Cool, BSN, RN, CWS, Kim RN, BSN Entered By: Gretta Cool, BSN, RN, CWS, Kim on 05/14/2019 14:24:29 Steven Meza ([id number]) -------------------------------------------------------------------------------- Lower Extremity Assessment Details Patient Name: Steven Meza Date of Service: 05/14/2019 1:45 PM Medical Record Number: [id number] Patient Account Number: [id number] Date of Birth/Sex: February 09, 1931 (86 y.o. M) Treating RN: Army Melia Primary Care Deantre Bourdon: Fulton Reek Other Clinician: Referring Philopater Mucha: Fulton Reek Treating Znya Albino/Extender: Melburn Hake, HOYT Weeks in Treatment: 6 Edema Assessment Assessed: [Left: No] [Right: No] Edema: [Left: Ye] [Right: s] Calf Left: Right: Point of Measurement: 33 cm From Medial Instep 46 cm cm Ankle Left: Right: Point of Measurement: 12 cm From Medial Instep 29 cm cm Vascular Assessment Pulses: Dorsalis  Pedis Palpable: [Left:Yes] Electronic Signature(s) Signed: 05/14/2019 3:07:10 PM By: Army Melia Entered By: Army Melia on 05/14/2019 13:52:49 Steven Meza ([id number]) -------------------------------------------------------------------------------- Multi Wound Chart Details Patient Name: Steven Meza Date of Service: 05/14/2019 1:45 PM Medical Record Number: [id number] Patient Account Number: [id number] Date of Birth/Sex: [birth year] (86 y.o. M) Treating RN: Cornell Barman Primary Care Gwynne Kemnitz: Fulton Reek Other Clinician: Referring Keyry Iracheta: Fulton Reek Treating Armarion Greek/Extender: Melburn Hake, HOYT Weeks in Treatment: 6 Vital Signs Height(in): 65 Pulse(bpm): 75 Weight(lbs): [id number] Blood Pressure(mmHg): 154/59  Body Mass Index(BMI): 45 Temperature(F): 98.0 Respiratory Rate 18 (breaths/min): Photos: [N/A:N/A] Wound Location: Left Lower Leg - N/A N/A Circumferential Wounding Event: Gradually Appeared N/A N/A Primary Etiology: Venous Leg Ulcer N/A N/A Comorbid History: Lymphedema, Coronary Artery N/A N/A Disease, Hypertension Date Acquired: 03/02/2019 N/A N/A Weeks of Treatment: 6 N/A N/A Wound Status: Open N/A N/A Measurements L x W x D 15x32x0.1 N/A N/A (cm) Area (cm) : 376.991 N/A N/A Volume (cm) : 37.699 N/A N/A % Reduction in Area: -36.80% N/A N/A % Reduction in Volume: -36.80% N/A N/A Classification: Partial Thickness N/A N/A Exudate Amount: Large N/A N/A Exudate Type: Serous N/A N/A Exudate Color: amber N/A N/A Wound Margin: Indistinct, nonvisible N/A N/A Steven Meza ([id number]) Granulation Amount: Large (67-100%) N/A N/A Granulation Quality: Pink N/A N/A Necrotic Amount: Small (1-33%) N/A N/A Exposed Structures: Fat Layer (Subcutaneous N/A N/A Tissue) Exposed: Yes Fascia: No Tendon: No Muscle: No Joint: No Bone: No Epithelialization: Small (1-33%) N/A N/A Treatment Notes Electronic Signature(s) Signed: 05/14/2019 5:00:56 PM By: Gretta Cool,  BSN, RN, CWS, Kim RN, BSN Entered By: Gretta Cool, BSN, RN, CWS, Kim on 05/14/2019 14:17:14 Steven Meza ([id number]) -------------------------------------------------------------------------------- Multi-Disciplinary Care Plan Details Patient Name: Steven Meza Date of Service: 05/14/2019 1:45 PM Medical Record Number: [id number] Patient Account Number: [id number] Date of Birth/Sex: 1931/02/22 (86 y.o. M) Treating RN: Cornell Barman Primary Care Camelia Stelzner: Fulton Reek Other Clinician: Referring [name] Sarin: Fulton Reek Treating Kartier Bennison/Extender: Melburn Hake, HOYT Weeks in Treatment: 6 Active Inactive Abuse / Safety / Falls / Self Care Management Nursing Diagnoses: Potential for falls Goals: Patient will remain injury free related to falls Date Initiated: 04/24/2019 Target Resolution Date: 07/14/2019 Goal Status: Active Interventions: Assess fall risk on admission and as needed Notes: Venous Leg Ulcer Nursing Diagnoses: Actual venous Insuffiency (use after diagnosis is confirmed) Goals: Patient will maintain optimal edema control Date Initiated: 04/24/2019 Target Resolution Date: 07/14/2019 Goal Status: Active Interventions: Compression as ordered Notes: Electronic Signature(s) Signed: 05/14/2019 5:00:56 PM By: Gretta Cool, BSN, RN, CWS, Kim RN, BSN Entered By: Gretta Cool, BSN, RN, CWS, Kim on 05/14/2019 14:17:04 Steven Meza ([id number]) -------------------------------------------------------------------------------- Pain Assessment Details Patient Name: Steven Meza Date of Service: 05/14/2019 1:45 PM Medical Record Number: [id number] Patient Account Number: [id number] Date of Birth/Sex: 06/23/1930 (86 y.o. M) Treating RN: Cornell Barman Primary Care Glendola Friedhoff: Fulton Reek Other Clinician: Referring Sagal Gayton: Fulton Reek Treating Marycatherine Maniscalco/Extender: Melburn Hake, HOYT Weeks in Treatment: 6 Active Problems Location of Pain Severity and Description of Pain Patient Has Paino  No Site Locations Pain Management and Medication Current Pain Management: Electronic Signature(s) Signed: 05/14/2019 4:18:53 PM By: Lorine Bears RCP, RRT, CHT Signed: 05/14/2019 5:00:56 PM By: Gretta Cool, BSN, RN, CWS, Kim RN, BSN Entered By: Lorine Bears on 05/14/2019 13:41:54 Steven Meza ([id number]) -------------------------------------------------------------------------------- Patient/Caregiver Education Details Patient Name: Steven Meza Date of Service: 05/14/2019 1:45 PM Medical Record Number: [id number] Patient Account Number: [id number] Date of Birth/Gender: 03-19-1931 (86 y.o. M) Treating RN: Cornell Barman Primary Care Physician: Fulton Reek Other Clinician: Referring Physician: Fulton Reek Treating Physician/Extender: Sharalyn Ink in Treatment: 6 Education Assessment Education Provided To: Patient Education Topics Provided Venous: Handouts: Controlling Swelling with Multilayered Compression Wraps Methods: Demonstration Responses: State content correctly Electronic Signature(s) Signed: 05/14/2019 5:00:56 PM By: Gretta Cool, BSN, RN, CWS, Kim RN, BSN Entered By: Gretta Cool, BSN, RN, CWS, Kim on 05/14/2019 14:23:40 Steven Meza ([id number]) -------------------------------------------------------------------------------- Wound Assessment Details Patient Name: Steven Meza Date of Service: 05/14/2019 1:45 PM Medical Record Number: [id number]  Patient Account Number: [id number] Date of Birth/Sex: July 06, 1930 (86 y.o. M) Treating RN: Army Melia Primary Care Ouita Nish: Fulton Reek Other Clinician: Referring Dewane Timson: Fulton Reek Treating Sabreen Kitchen/Extender: Melburn Hake, HOYT Weeks in Treatment: 6 Wound Status Wound Number: 6 Primary Venous Leg Ulcer Etiology: Wound Location: Left Lower Leg - Circumferential Wound Status: Open Wounding Event: Gradually Appeared Comorbid Lymphedema, Coronary Artery Disease, Date Acquired:  03/02/2019 History: Hypertension Weeks Of Treatment: 6 Clustered Wound: No Wound Measurements Length: (cm) 15 Width: (cm) 32 Depth: (cm) 0.1 Area: (cm) 376.991 Volume: (cm) 37.699 % Reduction in Area: -36.8% % Reduction in Volume: -36.8% Epithelialization: Small (1-33%) Tunneling: No Undermining: No Wound Description Classification: Partial Thickness Wound Margin: Indistinct, nonvisible Exudate Amount: Large Exudate Type: Serous Exudate Color: amber Foul Odor After Cleansing: No Slough/Fibrino Yes Wound Bed Granulation Amount: Large (67-100%) Exposed Structure Granulation Quality: Pink Fascia Exposed: No Necrotic Amount: Small (1-33%) Fat Layer (Subcutaneous Tissue) Exposed: Yes Necrotic Quality: Adherent Slough Tendon Exposed: No Muscle Exposed: No Joint Exposed: No Bone Exposed: No Treatment Notes Wound #6 (Left, Circumferential Lower Leg) Notes Scell, abd, 3 Layer right leg Electronic Signature(s) Signed: 05/14/2019 3:07:10 PM By: Army Melia Signed: 05/14/2019 5:00:56 PM By: Gretta Cool, BSN, RN, CWS, Kim RN, BSN Entered By: Gretta Cool, BSN, RN, CWS, Kim on 05/14/2019 14:20:00 Steven Meza ([id number]) -------------------------------------------------------------------------------- Vitals Details Patient Name: Steven Meza Date of Service: 05/14/2019 1:45 PM Medical Record Number: [id number] Patient Account Number: [id number] Date of Birth/Sex: [birth year] (86 y.o. M) Treating RN: Cornell Barman Primary Care Ryann Leavitt: Fulton Reek Other Clinician: Referring Esther Broyles: Fulton Reek Treating Carr Shartzer/Extender: Melburn Hake, HOYT Weeks in Treatment: 6 Vital Signs Time Taken: 13:42 Temperature (F): 98.0 Height (in): 65 Pulse (bpm): 75 Weight (lbs): 273 Respiratory Rate (breaths/min): 18 Body Mass Index (BMI): 45.4 Blood Pressure (mmHg): 154/59 Reference Range: 80 - 120 mg / dl Electronic Signature(s) Signed: 05/14/2019 4:18:53 PM By: Lorine Bears RCP, RRT, CHT Entered By: Lorine Bears on 05/14/2019 13:44:55

## 2019-05-21 ENCOUNTER — Other Ambulatory Visit: Payer: Self-pay

## 2019-05-21 ENCOUNTER — Encounter: Payer: Medicare Other | Admitting: Physician Assistant

## 2019-05-21 DIAGNOSIS — L97822 Non-pressure chronic ulcer of other part of left lower leg with fat layer exposed: Secondary | ICD-10-CM | POA: Diagnosis not present

## 2019-05-21 NOTE — Progress Notes (Signed)
Steven Meza ([id number]) Visit Report for 05/21/2019 Arrival Information Details Patient Name: Steven Meza, Steven Meza Date of Service: 05/21/2019 1:45 PM Medical Record Number: [id number] Patient Account Number: [id number] Date of Birth/Sex: [birth year] (86 y.o. M) Treating RN: Cornell Barman Primary Care Navjot Pilgrim: Fulton Reek Other Clinician: Referring Shamyia Grandpre: Fulton Reek Treating Esther Broyles/Extender: Melburn Hake, HOYT Weeks in Treatment: 7 Visit Information History Since Last Visit Added or deleted any medications: No Patient Arrived: Walker Any new allergies or adverse reactions: No Arrival Time: 13:50 Had a fall or experienced change in No Accompanied By: wife activities of daily living that may affect Transfer Assistance: None risk of falls: Patient Identification Verified: Yes Signs or symptoms of abuse/neglect since last visito No Secondary Verification Process Completed: Yes Hospitalized since last visit: No Implantable device outside of the clinic excluding No cellular tissue based products placed in the center since last visit: Has Dressing in Place as Prescribed: Yes Has Compression in Place as Prescribed: Yes Pain Present Now: No Electronic Signature(s) Signed: 05/21/2019 3:58:35 PM By: Lorine Bears RCP, RRT, CHT Entered By: Lorine Bears on 05/21/2019 13:51:21 Steven Meza ([id number]) -------------------------------------------------------------------------------- Compression Therapy Details Patient Name: Steven Meza Date of Service: 05/21/2019 1:45 PM Medical Record Number: [id number] Patient Account Number: [id number] Date of Birth/Sex: July 09, 1930 (86 y.o. M) Treating RN: Army Melia Primary Care Imogean Ciampa: Fulton Reek Other Clinician: Referring Miroslava Santellan: Fulton Reek Treating Rebecca Cairns/Extender: Melburn Hake, HOYT Weeks in Treatment: 7 Compression Therapy Performed for Wound Assessment: Wound #6 Left,Circumferential  Lower Leg Performed By: Clinician Army Melia, RN Compression Type: Three Layer Post Procedure Diagnosis Same as Pre-procedure Electronic Signature(s) Signed: 05/21/2019 3:02:44 PM By: Army Melia Entered By: Army Melia on 05/21/2019 14:03:44 Steven Meza ([id number]) -------------------------------------------------------------------------------- Encounter Discharge Information Details Patient Name: Steven Meza Date of Service: 05/21/2019 1:45 PM Medical Record Number: [id number] Patient Account Number: [id number] Date of Birth/Sex: Feb 25, 1931 (86 y.o. M) Treating RN: Army Melia Primary Care Alani Sabbagh: Fulton Reek Other Clinician: Referring Makila Colombe: Fulton Reek Treating Cartier Mapel/Extender: Melburn Hake, HOYT Weeks in Treatment: 7 Encounter Discharge Information Items Discharge Condition: Stable Ambulatory Status: Ambulatory Discharge Destination: Home Transportation: Private Auto Accompanied By: spouse Schedule Follow-up Appointment: Yes Clinical Summary of Care: Electronic Signature(s) Signed: 05/21/2019 3:02:44 PM By: Army Melia Entered By: Army Melia on 05/21/2019 14:06:17 Steven Meza ([id number]) -------------------------------------------------------------------------------- Lower Extremity Assessment Details Patient Name: Steven Meza Date of Service: 05/21/2019 1:45 PM Medical Record Number: [id number] Patient Account Number: [id number] Date of Birth/Sex: [birth year] (86 y.o. M) Treating RN: Army Melia Primary Care Brodric Schauer: Fulton Reek Other Clinician: Referring Carlissa Pesola: Fulton Reek Treating [place] Delauder/Extender: Melburn Hake, HOYT Weeks in Treatment: 7 Edema Assessment Assessed: [Left: No] [Right: No] Edema: [Left: Ye] [Right: s] Calf Left: Right: Point of Measurement: 33 cm From Medial Instep 45 cm cm Ankle Left: Right: Point of Measurement: 12 cm From Medial Instep 28 cm cm Vascular Assessment Pulses: Dorsalis  Pedis Palpable: [Left:Yes] Electronic Signature(s) Signed: 05/21/2019 3:02:44 PM By: Army Melia Entered By: Army Melia on 05/21/2019 13:55:09 Steven Meza ([id number]) -------------------------------------------------------------------------------- Multi Wound Chart Details Patient Name: Steven Meza Date of Service: 05/21/2019 1:45 PM Medical Record Number: [id number] Patient Account Number: [id number] Date of Birth/Sex: 1931-01-15 (86 y.o. M) Treating RN: Army Melia Primary Care Sabryn Preslar: Fulton Reek Other Clinician: Referring Decklyn Hornik: Fulton Reek Treating Grover Woodfield/Extender: Melburn Hake, HOYT Weeks in Treatment: 7 Vital Signs Height(in): 65 Pulse(bpm): 76 Weight(lbs): 273 Blood Pressure(mmHg): 150/56 Body Mass Index(BMI): 45 Temperature(F): 97.6 Respiratory Rate 18 (breaths/min): Photos: [N/A:N/A]  Wound Location: Left Lower Leg - N/A N/A Circumferential Wounding Event: Gradually Appeared N/A N/A Primary Etiology: Venous Leg Ulcer N/A N/A Comorbid History: Lymphedema, Coronary Artery N/A N/A Disease, Hypertension Date Acquired: 03/02/2019 N/A N/A Weeks of Treatment: 7 N/A N/A Wound Status: Open N/A N/A Measurements L x W x D 11x34x0.1 N/A N/A (cm) Area (cm) : 293.739 N/A N/A Volume (cm) : 29.374 N/A N/A % Reduction in Area: -6.60% N/A N/A % Reduction in Volume: -6.60% N/A N/A Classification: Partial Thickness N/A N/A Exudate Amount: Large N/A N/A Exudate Type: Serous N/A N/A Exudate Color: amber N/A N/A Wound Margin: Indistinct, nonvisible N/A N/A WYNDHAM, RAJCHEL ([id number]) Granulation Amount: Large (67-100%) N/A N/A Granulation Quality: Pink N/A N/A Necrotic Amount: Small (1-33%) N/A N/A Exposed Structures: Fat Layer (Subcutaneous N/A N/A Tissue) Exposed: Yes Fascia: No Tendon: No Muscle: No Joint: No Bone: No Epithelialization: Small (1-33%) N/A N/A Treatment Notes Electronic Signature(s) Signed: 05/21/2019 3:02:44 PM By: Army Melia Entered By: Army Melia on 05/21/2019 13:57:26 Steven Meza ([id number]) -------------------------------------------------------------------------------- Multi-Disciplinary Care Plan Details Patient Name: Steven Meza Date of Service: 05/21/2019 1:45 PM Medical Record Number: [id number] Patient Account Number: [id number] Date of Birth/Sex: 1930/12/09 (86 y.o. M) Treating RN: Army Melia Primary Care Darrell Hauk: Fulton Reek Other Clinician: Referring Houa Nie: Fulton Reek Treating Michel Eskelson/Extender: Melburn Hake, HOYT Weeks in Treatment: 7 Active Inactive Abuse / Safety / Falls / Self Care Management Nursing Diagnoses: Potential for falls Goals: Patient will remain injury free related to falls Date Initiated: 04/24/2019 Target Resolution Date: 07/14/2019 Goal Status: Active Interventions: Assess fall risk on admission and as needed Notes: Venous Leg Ulcer Nursing Diagnoses: Actual venous Insuffiency (use after diagnosis is confirmed) Goals: Patient will maintain optimal edema control Date Initiated: 04/24/2019 Target Resolution Date: 07/14/2019 Goal Status: Active Interventions: Compression as ordered Notes: Electronic Signature(s) Signed: 05/21/2019 3:02:44 PM By: Army Melia Entered By: Army Melia on 05/21/2019 13:57:18 Steven Meza ([id number]) -------------------------------------------------------------------------------- Pain Assessment Details Patient Name: Steven Meza Date of Service: 05/21/2019 1:45 PM Medical Record Number: [id number] Patient Account Number: [id number] Date of Birth/Sex: Nov 23, 1930 (86 y.o. M) Treating RN: Cornell Barman Primary Care Roselind Klus: Fulton Reek Other Clinician: Referring Arrayah Connors: Fulton Reek Treating Ashelynn Marks/Extender: Melburn Hake, HOYT Weeks in Treatment: 7 Active Problems Location of Pain Severity and Description of Pain Patient Has Paino No Site Locations Pain Management and Medication Current  Pain Management: Electronic Signature(s) Signed: 05/21/2019 3:58:35 PM By: Lorine Bears RCP, RRT, CHT Signed: 05/21/2019 5:04:31 PM By: Gretta Cool, BSN, RN, CWS, Kim RN, BSN Entered By: Lorine Bears on 05/21/2019 13:51:29 Steven Meza ([id number]) -------------------------------------------------------------------------------- Patient/Caregiver Education Details Patient Name: Steven Meza Date of Service: 05/21/2019 1:45 PM Medical Record Number: [id number] Patient Account Number: [id number] Date of Birth/Gender: [birth year] (86 y.o. M) Treating RN: Army Melia Primary Care Physician: Fulton Reek Other Clinician: Referring Physician: Fulton Reek Treating Physician/Extender: Sharalyn Ink in Treatment: 7 Education Assessment Education Provided To: Patient Education Topics Provided Wound/Skin Impairment: Handouts: Caring for Your Ulcer Methods: Demonstration, Explain/Verbal Responses: State content correctly Electronic Signature(s) Signed: 05/21/2019 3:02:44 PM By: Army Melia Entered By: Army Melia on 05/21/2019 14:05:40 Steven Meza ([id number]) -------------------------------------------------------------------------------- Wound Assessment Details Patient Name: Steven Meza Date of Service: 05/21/2019 1:45 PM Medical Record Number: [id number] Patient Account Number: [id number] Date of Birth/Sex: 11-02-1930 (86 y.o. M) Treating RN: Army Melia Primary Care Leidy Massar: Fulton Reek Other Clinician: Referring Arnett Galindez: Fulton Reek Treating Sadae Arrazola/Extender: Melburn Hake, HOYT Weeks in Treatment: 7 Wound  Status Wound Number: 6 Primary Venous Leg Ulcer Etiology: Wound Location: Left Lower Leg - Circumferential Wound Status: Open Wounding Event: Gradually Appeared Comorbid Lymphedema, Coronary Artery Disease, Date Acquired: 03/02/2019 History: Hypertension Weeks Of Treatment: 7 Clustered Wound: No Photos Wound  Measurements Length: (cm) 11 Width: (cm) 34 Depth: (cm) 0.1 Area: (cm) 293.739 Volume: (cm) 29.374 % Reduction in Area: -6.6% % Reduction in Volume: -6.6% Epithelialization: Small (1-33%) Tunneling: No Undermining: No Wound Description Classification: Partial Thickness Foul Odor Wound Margin: Indistinct, nonvisible Slough/Fi Exudate Amount: Large Exudate Type: Serous Exudate Color: amber After Cleansing: No brino Yes Wound Bed Granulation Amount: Large (67-100%) Exposed Structure Granulation Quality: Pink Fascia Exposed: No Necrotic Amount: Small (1-33%) Fat Layer (Subcutaneous Tissue) Exposed: Yes Necrotic Quality: Adherent Slough Tendon Exposed: No Muscle Exposed: No Joint Exposed: No Bone Exposed: No Treatment Notes TIJUAN, SAGEL ([id number]) Wound #6 (Left, Circumferential Lower Leg) Notes Scell, abd, 3 Layer left leg Electronic Signature(s) Signed: 05/21/2019 3:02:44 PM By: Army Melia Entered By: Army Melia on 05/21/2019 13:54:29 Steven Meza ([id number]) -------------------------------------------------------------------------------- Vitals Details Patient Name: Steven Meza Date of Service: 05/21/2019 1:45 PM Medical Record Number: [id number] Patient Account Number: [id number] Date of Birth/Sex: 06/13/1930 (86 y.o. M) Treating RN: Cornell Barman Primary Care Jaxie Racanelli: Fulton Reek Other Clinician: Referring Cythina Mickelsen: Fulton Reek Treating Howie Rufus/Extender: Melburn Hake, HOYT Weeks in Treatment: 7 Vital Signs Time Taken: 13:50 Temperature (F): 97.6 Height (in): 65 Pulse (bpm): 76 Weight (lbs): 273 Respiratory Rate (breaths/min): 18 Body Mass Index (BMI): 45.4 Blood Pressure (mmHg): 150/56 Reference Range: 80 - 120 mg / dl Electronic Signature(s) Signed: 05/21/2019 3:58:35 PM By: Lorine Bears RCP, RRT, CHT Entered By: Lorine Bears on 05/21/2019 13:52:35

## 2019-05-21 NOTE — Progress Notes (Addendum)
[name], [name] ([id number]) Visit Report for 05/21/2019 Chief Complaint Document Details Patient Name: Steven Meza, Steven Meza Date of Service: 05/21/2019 1:45 PM Medical Record Number: [id number] Patient Account Number: [id number] Date of Birth/Sex: 04-Mar-1931 (86 y.o. M) Treating RN: Cornell Barman Primary Care Provider: Fulton Reek Other Clinician: Referring Provider: Fulton Reek Treating Provider/Extender: Melburn Hake, Sahas Sluka Weeks in Treatment: 7 Information Obtained from: Patient Chief Complaint Left LE Ulcer Electronic Signature(s) Signed: 05/21/2019 1:52:15 PM By: Worthy Keeler PA-C Entered By: Worthy Keeler on 05/21/2019 13:52:14 Steven Meza ([id number]) -------------------------------------------------------------------------------- HPI Details Patient Name: Steven Meza Date of Service: 05/21/2019 1:45 PM Medical Record Number: [id number] Patient Account Number: [id number] Date of Birth/Sex: [birth year] (86 y.o. M) Treating RN: Cornell Barman Primary Care Provider: Fulton Reek Other Clinician: Referring Provider: Fulton Reek Treating Provider/Extender: Melburn Hake, Zacchary Pompei Weeks in Treatment: 7 History of Present Illness HPI Description: ADMISSION 05/17/2018 Mr. Steven Meza is an 86 year old man who is been treated at the lymphedema clinic for a long period with lymphedema wraps for bilateral lower extremity lymphedema. About 6 months ago they noted small open areas on his left lateral calf just above the ankle. These were open. They are apparently putting some form of ointment on this. They have been referred here for our review of this. The patient has a long history of lymphedema and venous stasis. It says in his records that he has recurrent cellulitis although his wife denies this but she states that the lymphedema may have started with cellulitis several years ago. Also in his record there is a history of bladder cancer which they seem to know very little about however  he did not have any radiation to the pelvis or anything that could have contributed to lymphedema that they are aware of. There is no prior wound history. The patient has 2 small punched out areas on the left lateral calf that have adherent debris on the surface. Past medical history; lymphedema, hypertension, cellulitis, hearing loss, morbid obesity, osteoarthritis, venous stasis, bladder CA, diverticulosis. ABIs in our clinic were 0.36 on the right and 0.57 on the left 05/24/2018; corrected age on this patient is 65 versus what I said last week. He has been for his arterial studies which predictably are not very good. On the right his ABI is 0.65. Monophasic waveforms noted at the right ankle including the posterior tibial and dorsalis pedis. He has triphasic waveforms of the common femoral and profunda femoris triphasic proximal SFA with monophasic distal FSA and popliteal artery. Monophasic tibial waveforms. On the left his ABI is 0.58 monophasic waveforms at the left ankle. Duplex of the left lower extremity demonstrated atherosclerotic change with monophasic waveforms throughout the left lower extremity. There was some concern about left iliac disease and potentially femoral-popliteal and tibial disease. The patient does not describe claudication although according to his wife he over emphasizes his activity. Patient states he is limited by pain in both knees His wounds are on the left lateral calf. 2 small areas with necrotic debris. We have been using silver collagen and light Ace wraps 05/31/2018; wounds on the left lateral calf in the setting of very significant lymphedema and probably PAD. We have been using silver collagen. The base of the small wound looks reasonably improved. I sent him to see Dr. Fletcher Anon however I see now he has an appointment with Dr. Alvester Chou on February 12 2/5; left lateral calf wound looks the same. His wife is doing a good job with her lymphedema wraps and  maintaining that the edema. He most likely has PAD and is due to see Dr. Gwenlyn Found of infectious disease on February 12 2/19; left lateral calf wounds look about the same. This looks like wound secondary to chronic venous insufficiency with lymphedema however the patient has very poor arterial studies. We referred him to Dr. Gwenlyn Found. Dr. Gwenlyn Found did not feel he needed to do anything from an arterial point of view. He did not address the question about the aggressiveness of compression. After some thoughts about this I elected to go ahead and put him in 3 layer compression which after all would be less compression then the lymphedema clinic was putting on this. I am hopeful that this should be enough to get some closure of the small wounds 2/26; left lateral calf wound letter this week. He tolerated the 3 layer compression well furthermore he is sleeping in a hospital bed which helps keep his legs up at night. The wound looks better measuring smaller especially in width 3/3; left lateral calf wound about the same size. The 3 layer compression has really helped get the edema down in his leg however. Using silver collagen 3/11; left lateral calf wound better looking wound surface but about the same size. We have been using 3 layer compression which is really helped get the edema down in the left leg. Even after Dr. Kennon Holter assessment I am reluctant to go to 4 layer compression. He is tolerating 3 layer compression well. We have been using silver collagen CASON, SEAGREN ([id number]) 3/18-Patient returns for his left lateral calf wound which overall is looking better, slightly increased in size and dimensions and area, he is tolerating the 3 layer compression, has been dressed with silver collagen which we will continue 3/25oleft lateral calf wound much better looking. Size is smaller. He has been using silver collagen 4/1; gradually getting smaller in size. Using silver collagen with 3 layer compression. I  think the patient has some degree of PAD. He saw Dr. Gwenlyn Found in consultation, he did not specifically comment on whether he could tolerate 4 layer compression 4/8; the wound is slightly smaller in depth. We are using 3 layer compression with silver collagen. I had him seen for arterial insufficiency however his ABI was 0.57 in the left leg. Dr. Gwenlyn Found really did not seem to middle on the degree of compression he could tolerate 4/15; the wound is slightly smaller and slightly less deep. We have been using 3 layer compression with silver collagen. There is not an option for 4-layer compression here. 4/22; the patient is making decent progress on his wound on the left lateral calf however he arrives in with a superficial wound on the right anterior tibial area. Were not really sure how this happened. He had been using a stocking on the right leg. He has been using silver collagen on the wounds Amazing to me he is he appears to have compression pumps at home. I am not really sure I knew this before. In any case he has not been using them 4/29; left lateral calf wound continues to get gradually smaller. This looks like it is on its way to closure. oThe area on the right anterior tibial area is about the same in terms of size superficial without any depth. This was probably wrap injury oHe tells me that he has been using his compression pumps once a day for 1 hour without any pain 5/6; not as good today. Left lateral calf wound is actually larger and  he does not have quite as good edema control. The area on the right anterior tibia is about the same still superficial. He states he is using his compression pumps once a day. We are using 3 layer compression on the left and Curlex Coban on the right 5/13; the area on the right is very close to being healed. The area on the left still has some depth but has a healthy wound surface. He has excellent edema control bilaterally. Using Hydrofera Blue 5/20-Patient  returns in 1 week after being in 3 layer compression on the left, the left lateral calf ulcer is stable with some slough, the right anterior tibial ulcer is healed we have been using Kerlix Coban on that leg 5/27; Hydrofera Blue and 3 layer compression. Still some slough in the wound area and some nonviable edges around the wound. 6/3; this patient has lymphedema and chronic venous insufficiency. He had difficult to close wound on his left lateral calf. This is closed today. 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 Finally he has known PAD but he is managed to tolerate the compression we reporting on him and also using his compression pumps that he already had at home twice a day 6/10; we discharge this patient last week having finally close the small area in the left lateral calf. Apparently by Friday of last week this had opened and was draining. He is therefore back in clinic. His wife who is present also was concerned about an area on the right anterior tibia."o 6/17; the patient does not have an open area on the right leg. On the left he has a small circular area anteriorly and the same area laterally that he had last time. However both of these appear to be improved he has his Farrow wrap 30/40. It turns out that he is only using his external compression pumps once a day. I tried to get him up to twice a day today 6/24; the patient comes in with his external compression garment on the right leg/Farrow wrap. As far as we know no open wounds here. He has 2 areas that are smaller one on the left anterior pretibial and one on the left lateral calf which is the site of his initial wounds. I have successfully caught him into his external compression pumps twice a day 7/1; still using his external compression garment on the right leg. As far as we know there are no open wounds here. The area on the left lateral calf which was the site of his initial wound has  closed over. The area on the left anterior pretibial that we identified last week is still open. 7/8; as far as we know he has no open wounds on the right leg. He has his external compression garment on here. The original wound on the left lateral calf has closed over. He developed an area on the left anterior pretibial 2 weeks ago and seems to have 2 smaller areas on the proximal and distal lateral calf. We do not have his good edema control this week as I am used to seeing 7/15; the patient has still a linear area on the left anterior tibia. The area on the left lateral leg looked somewhat inflamed today but no cellulitis. He did have what looked to be a small pustule that I cultured for CandS although I did not give him empiric antibiotics. 7/22; small area on the left anterior tibia. The area on the left  lateral leg still looks inflamed. The pustule that I cultured last week grew Enterobacter and Pseudomonas. I prescribed Cipro they still have not picked that up. In any case the area actually looks fairly satisfactory. He has decent edema control using his compression pumps twice a day 7/29; the area on the left lateral leg looks a lot better than last week. Nothing is open here. He has an area just medial to the mid anterior tibia that is still open that is only open wound today. 8/5-Patient returns at 1 week for the left leg ulcer, which is actually looking better, he is also complaining of left plantar foot pain below the great toe. He does have a hematoma here STEPHANOS, TIET ([id number]) 8/12-Patient returns at 1 week, the left leg open area looks about the same if not slightly better, the left plantar hematoma under the great toe is about the same, patient apparently has not been resting his foot on anything for too long period of time and does not wear the Birkenstock shoes unless he walks outdoors which he does not do often 8/19; small wound on the left anterior lower leg this is  just about closed there is nothing on the left lateral leg. He is using his compression pumps He arrives in clinic today with a black eschar over the right first plantar metatarsal head. This was apparently a bruise that was identified by her nurses last week. He wears crocs on his feet. He also may rub these on a footboard at home 8/26; there is nothing open on the left lateral lower leg he has a small area just medial to the tibia. He is going to see his podiatrist Dr. Milinda Pointer about the area on the left plantar first metatarsal head. He is using his external compression pumps. He has a juxta lite on the right leg 9/2; there is nothing open on either side of her left lower leg. He went to see podiatry about the area on the left first metatarsal head. This was apparently debrided. He was given mupirocin and oral antibiotics. I do not believe he is offloading this at all. Readmission: 04/02/2019 upon evaluation today patient actually appears for reevaluation here in our clinic secondary to issues that he is having with his left lower extremity. He has been tolerating the dressing changes without complication. Fortunately there is no signs of active infection at this time. No fevers, chills, nausea, vomiting, or diarrhea. He has been in the hospital since we last saw him and then subsequently transferred back to [facility]. He tells me that they have been changing his dressing once a day and that he has had a significant amount of pain secondary to undergoing these dressing changes. Fortunately there is no evidence of active infection at this point which is good news. No fevers, chills, nausea, vomiting, or diarrhea. With that being said he wonders if we can go back to doing what we were doing before which was when he actually had the dressing changes performed once a week here in the clinic he tells me that he did very well with that. At that point he tolerated a 3 layer compression wrap which  was approved by vascular, Dr. Tyrell Antonio office, to be used previous although 4-layer compression wrap is probably too much for him. 04/16/2019 on evaluation today patient appears to be doing decently well with regard to his lower extremity although he has had a lot of drainage from his leg. A lot of this I  think is just secondary to the lymphedema with that being said he does have some odor as well which may again just be due to more of the excessive moisture although I think that he may also have something that is a result of bacteria again he has somewhat of a Pseudomonas-like smell to the drainage and fortunately though he is not showing any signs of severe cellulitis he is still experiencing quite a bit of drainage and the discoloration is somewhat of a yellow/green in color which is consistent with Pseudomonas. If he can tolerated I think he may do well with Cipro. 04/24/2019 on evaluation today patient appears to be doing well with regard to his wound in the gluteal region which is healing quite well. That is excellent news. With that being said he is also doing better with regard to his left lower extremity overall I feel like things are definitely showing signs of improvement. 12/28-Patient returns with regards to his gluteal wound that is improving, Left lower leg wounds with significant edema in the lower legs that appear to be persistent. Patient is in compression and has lymphedema pumps at home but apparently he does not use consistently 05/07/2019 Upon inspection patient's wound bed actually showed signs on evaluation today patient's wound currently showed signs of improvement. There is still a lot of maceration but it does look like that there is been some of this maceration occurring as a result of drainage in particular and some as a result of the fact that they have been putting bag balm on the wound area as well as the leg in general. I think this is applying too much moisture and  not doing him any good. I discussed with the patient and his wife today that I think that practice needs to be adjusted I would recommend using a different type of lotion in order to help with moisture around the wound but I would not recommend anything directly on the wound itself. 05/14/2019 on evaluation today patient actually appears to be doing much better in regard to his lower extremity. They have not been using any of the bag balm on the area I think this has made a tremendous improvement overall in his wounds. He has a lot of new skin growth and though there still are a lot of open wounds scattered throughout the circumferential lower leg this overall seems to be doing much better to me. 05/21/2019 upon evaluation today patient appears to be doing well in regard to his lower extremity although he is having unfortunately some blue-green drainage noted at this point. The does have me concerned for a reinitiation of the Pseudomonas infection that we previously treated in December. The last time I gave him a prescription was April 16, 2019. That was a little over a month ago. Nonetheless he does have more green drainage noted today and I think this is indicative of potentially a revving up infection. Obviously we do not want to get to that point. KAENON, FUHRMEISTER ([id number]) Electronic Signature(s) Signed: 05/21/2019 2:39:44 PM By: Worthy Keeler PA-C Entered By: Worthy Keeler on 05/21/2019 14:39:43 Steven Meza ([id number]) -------------------------------------------------------------------------------- Physical Exam Details Patient Name: Steven Meza Date of Service: 05/21/2019 1:45 PM Medical Record Number: [id number] Patient Account Number: [id number] Date of Birth/Sex: 08-01-1930 (86 y.o. M) Treating RN: Cornell Barman Primary Care Provider: Fulton Reek Other Clinician: Referring Provider: Fulton Reek Treating Provider/Extender: Melburn Hake, Annalea Alguire Weeks in Treatment:  7 Constitutional Well-nourished and well-hydrated in no  acute distress. Respiratory normal breathing without difficulty. Psychiatric this patient is able to make decisions and demonstrates good insight into disease process. Alert and Oriented x 3. pleasant and cooperative. Notes Upon inspection patient's wound bed actually showed signs of some improvement which was good he did not show signs of severe cellulitis which is also good. However the drainage was definitely more blue/green in color as compared to even last week which does have me concerned. I do not want things to get out of control. Electronic Signature(s) Signed: 05/21/2019 2:40:05 PM By: Worthy Keeler PA-C Entered By: Worthy Keeler on 05/21/2019 14:40:04 Steven Meza ([id number]) -------------------------------------------------------------------------------- Physician Orders Details Patient Name: Steven Meza Date of Service: 05/21/2019 1:45 PM Medical Record Number: [id number] Patient Account Number: [id number] Date of Birth/Sex: 1931-03-28 (86 y.o. M) Treating RN: Army Melia Primary Care Provider: Fulton Reek Other Clinician: Referring Provider: Fulton Reek Treating Provider/Extender: Melburn Hake, Yale Golla Weeks in Treatment: 7 Verbal / Phone Orders: No Diagnosis Coding ICD-10 Coding Meza Description I89.0 Lymphedema, not elsewhere classified I87.2 Venous insufficiency (chronic) (peripheral) L97.822 Non-pressure chronic ulcer of other part of left lower leg with fat layer exposed I10 Essential (primary) hypertension I25.10 Atherosclerotic heart disease of native coronary artery without angina pectoris L84 Corns and callosities Wound Cleansing Wound #6 Left,Circumferential Lower Leg o May shower with protection. o No tub bath. Primary Wound Dressing Wound #6 Left,Circumferential Lower Leg o Silver Alginate Secondary Dressing Wound #6 Left,Circumferential Lower Leg o ABD pad Dressing  Change Frequency Wound #6 Left,Circumferential Lower Leg o Change Dressing Monday, Wednesday, Friday - Home Health to change dressings on Wednesdays and Fridays Follow-up Appointments Wound #6 Left,Circumferential Lower Leg o Return Appointment in 1 week. o Nurse Visit as needed Edema Control Wound #6 Left,Circumferential Lower Leg o 3 Layer Compression System - Left Lower Extremity Off-Loading Wound #6 Left,Circumferential Lower Leg o Turn and reposition every 2 hours Home Health ISAAH, VERONA ([id number]) Wound #6 Left,Circumferential Lower Leg o [place] [facility] o Home Health Nurse may visit PRN to address patientos wound care needs. o FACE TO FACE ENCOUNTER: MEDICARE and MEDICAID PATIENTS: I certify that this patient is under my care and that I had a face-to-face encounter that meets the physician face-to-face encounter requirements with this patient on this date. The encounter with the patient was in whole or in part for the following MEDICAL CONDITION: (primary reason for [place]) MEDICAL NECESSITY: I certify, that based on my findings, NURSING services are a medically necessary home health service. HOME BOUND STATUS: I certify that my clinical findings support that this patient is homebound (i.e., Due to illness or injury, pt requires aid of supportive devices such as crutches, cane, wheelchairs, walkers, the use of special transportation or the assistance of another person to leave their place of residence. There is a normal inability to leave the home and doing so requires considerable and taxing effort. Other absences are for medical reasons / religious services and are infrequent or of short duration when for other reasons). o If current dressing causes regression in wound condition, may D/C ordered dressing product/s and apply Normal Saline Moist Dressing daily until next [place] / Other MD appointment. [place] of regression in wound condition at [phone]. o Please direct any NON-WOUND related issues/requests for orders to patient's Primary Care Physician Medications-please add to medication list. Wound #6 Left,Circumferential Lower Leg o P.O. Antibiotics - Cipro Electronic Signature(s) Signed: 05/21/2019 3:02:44 PM  By: Army Melia Signed: 05/21/2019 4:47:43 PM By: Worthy Keeler PA-C Previous Signature: 05/21/2019 2:09:58 PM Version By: Worthy Keeler PA-C Entered By: Army Melia on 05/21/2019 14:10:52 Steven Meza ([id number]) -------------------------------------------------------------------------------- Problem List Details Patient Name: Steven Meza Date of Service: 05/21/2019 1:45 PM Medical Record Number: [id number] Patient Account Number: [id number] Date of Birth/Sex: Jul 20, 1930 (86 y.o. M) Treating RN: Cornell Barman Primary Care Provider: Fulton Reek Other Clinician: Referring Provider: Fulton Reek Treating Provider/Extender: Melburn Hake, Shanley Furlough Weeks in Treatment: 7 Active Problems ICD-10 Evaluated Encounter Meza Description Active Date Today Diagnosis I89.0 Lymphedema, not elsewhere classified 04/02/2019 No Yes I87.2 Venous insufficiency (chronic) (peripheral) 04/02/2019 No Yes L97.822 Non-pressure chronic ulcer of other part of left lower leg with 04/02/2019 No Yes fat layer exposed I10 Essential (primary) hypertension 04/02/2019 No Yes I25.10 Atherosclerotic heart disease of native coronary artery 04/02/2019 No Yes without angina pectoris L84 Corns and callosities 05/09/2019 No Yes Inactive Problems Resolved Problems Electronic Signature(s) Signed: 05/21/2019 1:52:08 PM By: Worthy Keeler PA-C Entered By: Worthy Keeler on 05/21/2019 13:52:08 Steven Meza ([id number]) -------------------------------------------------------------------------------- Progress Note Details Patient Name: Steven Meza Date of Service: 05/21/2019  1:45 PM Medical Record Number: [id number] Patient Account Number: [id number] Date of Birth/Sex: 08/23/1930 (86 y.o. M) Treating RN: Cornell Barman Primary Care Provider: Fulton Reek Other Clinician: Referring Provider: Fulton Reek Treating Provider/Extender: Melburn Hake, Dazaria Macneill Weeks in Treatment: 7 Subjective Chief Complaint Information obtained from Patient Left LE Ulcer History of Present Illness (HPI) ADMISSION 05/17/2018 Mr. Zuker is an 86 year old man who is been treated at the lymphedema clinic for a long period with lymphedema wraps for bilateral lower extremity lymphedema. About 6 months ago they noted small open areas on his left lateral calf just above the ankle. These were open. They are apparently putting some form of ointment on this. They have been referred here for our review of this. The patient has a long history of lymphedema and venous stasis. It says in his records that he has recurrent cellulitis although his wife denies this but she states that the lymphedema may have started with cellulitis several years ago. Also in his record there is a history of bladder cancer which they seem to know very little about however he did not have any radiation to the pelvis or anything that could have contributed to lymphedema that they are aware of. There is no prior wound history. The patient has 2 small punched out areas on the left lateral calf that have adherent debris on the surface. Past medical history; lymphedema, hypertension, cellulitis, hearing loss, morbid obesity, osteoarthritis, venous stasis, bladder CA, diverticulosis. ABIs in our clinic were 0.36 on the right and 0.57 on the left 05/24/2018; corrected age on this patient is 85 versus what I said last week. He has been for his arterial studies which predictably are not very good. On the right his ABI is 0.65. Monophasic waveforms noted at the right ankle including the posterior tibial and dorsalis pedis. He has  triphasic waveforms of the common femoral and profunda femoris triphasic proximal SFA with monophasic distal FSA and popliteal artery. Monophasic tibial waveforms. On the left his ABI is 0.58 monophasic waveforms at the left ankle. Duplex of the left lower extremity demonstrated atherosclerotic change with monophasic waveforms throughout the left lower extremity. There was some concern about left iliac disease and potentially femoral-popliteal and tibial disease. The patient does not describe claudication although according to his wife he over emphasizes his activity. Patient states  he is limited by pain in both knees His wounds are on the left lateral calf. 2 small areas with necrotic debris. We have been using silver collagen and light Ace wraps 05/31/2018; wounds on the left lateral calf in the setting of very significant lymphedema and probably PAD. We have been using silver collagen. The base of the small wound looks reasonably improved. I sent him to see Dr. Fletcher Anon however I see now he has an appointment with Dr. Alvester Chou on February 12 2/5; left lateral calf wound looks the same. His wife is doing a good job with her lymphedema wraps and maintaining that the edema. He most likely has PAD and is due to see Dr. Gwenlyn Found of infectious disease on February 12 2/19; left lateral calf wounds look about the same. This looks like wound secondary to chronic venous insufficiency with lymphedema however the patient has very poor arterial studies. We referred him to Dr. Gwenlyn Found. Dr. Gwenlyn Found did not feel he needed to do anything from an arterial point of view. He did not address the question about the aggressiveness of compression. After some thoughts about this I elected to go ahead and put him in 3 layer compression which after all would be less compression then the lymphedema clinic was putting on this. I am hopeful that this should be enough to get some closure of the small wounds 2/26; left lateral calf wound  letter this week. He tolerated the 3 layer compression well furthermore he is sleeping in a hospital JARIAN, ODETTE A. ([id number]) bed which helps keep his legs up at night. The wound looks better measuring smaller especially in width 3/3; left lateral calf wound about the same size. The 3 layer compression has really helped get the edema down in his leg however. Using silver collagen 3/11; left lateral calf wound better looking wound surface but about the same size. We have been using 3 layer compression which is really helped get the edema down in the left leg. Even after Dr. Kennon Holter assessment I am reluctant to go to 4 layer compression. He is tolerating 3 layer compression well. We have been using silver collagen 3/18-Patient returns for his left lateral calf wound which overall is looking better, slightly increased in size and dimensions and area, he is tolerating the 3 layer compression, has been dressed with silver collagen which we will continue 3/25 left lateral calf wound much better looking. Size is smaller. He has been using silver collagen 4/1; gradually getting smaller in size. Using silver collagen with 3 layer compression. I think the patient has some degree of PAD. He saw Dr. Gwenlyn Found in consultation, he did not specifically comment on whether he could tolerate 4 layer compression 4/8; the wound is slightly smaller in depth. We are using 3 layer compression with silver collagen. I had him seen for arterial insufficiency however his ABI was 0.57 in the left leg. Dr. Gwenlyn Found really did not seem to middle on the degree of compression he could tolerate 4/15; the wound is slightly smaller and slightly less deep. We have been using 3 layer compression with silver collagen. There is not an option for 4-layer compression here. 4/22; the patient is making decent progress on his wound on the left lateral calf however he arrives in with a superficial wound on the right anterior tibial area. Were  not really sure how this happened. He had been using a stocking on the right leg. He has been using silver collagen on the wounds Amazing to  me he is he appears to have compression pumps at home. I am not really sure I knew this before. In any case he has not been using them 4/29; left lateral calf wound continues to get gradually smaller. This looks like it is on its way to closure. The area on the right anterior tibial area is about the same in terms of size superficial without any depth. This was probably wrap injury He tells me that he has been using his compression pumps once a day for 1 hour without any pain 5/6; not as good today. Left lateral calf wound is actually larger and he does not have quite as good edema control. The area on the right anterior tibia is about the same still superficial. He states he is using his compression pumps once a day. We are using 3 layer compression on the left and Curlex Coban on the right 5/13; the area on the right is very close to being healed. The area on the left still has some depth but has a healthy wound surface. He has excellent edema control bilaterally. Using Hydrofera Blue 5/20-Patient returns in 1 week after being in 3 layer compression on the left, the left lateral calf ulcer is stable with some slough, the right anterior tibial ulcer is healed we have been using Kerlix Coban on that leg 5/27; Hydrofera Blue and 3 layer compression. Still some slough in the wound area and some nonviable edges around the wound. 6/3; this patient has lymphedema and chronic venous insufficiency. He had difficult to close wound on his left lateral calf. This is closed today. 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 Finally he has known PAD but he is managed to tolerate the compression we reporting on him and also using his compression pumps that he already had at home twice a day 6/10; we discharge this patient last week  having finally close the small area in the left lateral calf. Apparently by Friday of last week this had opened and was draining. He is therefore back in clinic. His wife who is present also was concerned about an area on the right anterior tibia."o 6/17; the patient does not have an open area on the right leg. On the left he has a small circular area anteriorly and the same area laterally that he had last time. However both of these appear to be improved he has his Farrow wrap 30/40. It turns out that he is only using his external compression pumps once a day. I tried to get him up to twice a day today 6/24; the patient comes in with his external compression garment on the right leg/Farrow wrap. As far as we know no open wounds here. He has 2 areas that are smaller one on the left anterior pretibial and one on the left lateral calf which is the site of his initial wounds. I have successfully caught him into his external compression pumps twice a day 7/1; still using his external compression garment on the right leg. As far as we know there are no open wounds here. The area on the left lateral calf which was the site of his initial wound has closed over. The area on the left anterior pretibial that we identified last week is still open. 7/8; as far as we know he has no open wounds on the right leg. He has his external compression garment on here. The original wound on the left lateral calf has closed  over. He developed an area on the left anterior pretibial 2 weeks ago and seems to have 2 smaller areas on the proximal and distal lateral calf. We do not have his good edema control this week as I am used to seeing 7/15; the patient has still a linear area on the left anterior tibia. The area on the left lateral leg looked somewhat inflamed today but no cellulitis. He did have what looked to be a small pustule that I cultured for CandS although I did not give him empiric antibiotics. 7/22; small area  on the left anterior tibia. The area on the left lateral leg still looks inflamed. The pustule that I cultured last Steven Meza, Steven Meza. ([id number]) week grew Enterobacter and Pseudomonas. I prescribed Cipro they still have not picked that up. In any case the area actually looks fairly satisfactory. He has decent edema control using his compression pumps twice a day 7/29; the area on the left lateral leg looks a lot better than last week. Nothing is open here. He has an area just medial to the mid anterior tibia that is still open that is only open wound today. 8/5-Patient returns at 1 week for the left leg ulcer, which is actually looking better, he is also complaining of left plantar foot pain below the great toe. He does have a hematoma here 8/12-Patient returns at 1 week, the left leg open area looks about the same if not slightly better, the left plantar hematoma under the great toe is about the same, patient apparently has not been resting his foot on anything for too long period of time and does not wear the [employer] shoes unless he walks outdoors which he does not do often 8/19; small wound on the left anterior lower leg this is just about closed there is nothing on the left lateral leg. He is using his compression pumps He arrives in clinic today with a black eschar over the right first plantar metatarsal head. This was apparently a bruise that was identified by her nurses last week. He wears crocs on his feet. He also may rub these on a footboard at home 8/26; there is nothing open on the left lateral lower leg he has a small area just medial to the tibia. He is going to see his podiatrist Dr. Milinda Pointer about the area on the left plantar first metatarsal head. He is using his external compression pumps. He has a juxta lite on the right leg 9/2; there is nothing open on either side of her left lower leg. He went to see podiatry about the area on the left first metatarsal head. This was  apparently debrided. He was given mupirocin and oral antibiotics. I do not believe he is offloading this at all. Readmission: 04/02/2019 upon evaluation today patient actually appears for reevaluation here in our clinic secondary to issues that he is having with his left lower extremity. He has been tolerating the dressing changes without complication. Fortunately there is no signs of active infection at this time. No fevers, chills, nausea, vomiting, or diarrhea. He has been in the hospital since we last saw him and then subsequently transferred back to [facility]. He tells me that they have been changing his dressing once a day and that he has had a significant amount of pain secondary to undergoing these dressing changes. Fortunately there is no evidence of active infection at this point which is good news. No fevers, chills, nausea, vomiting, or diarrhea. With that being said  he wonders if we can go back to doing what we were doing before which was when he actually had the dressing changes performed once a week here in the clinic he tells me that he did very well with that. At that point he tolerated a 3 layer compression wrap which was approved by vascular, Dr. Tyrell Antonio office, to be used previous although 4-layer compression wrap is probably too much for him. 04/16/2019 on evaluation today patient appears to be doing decently well with regard to his lower extremity although he has had a lot of drainage from his leg. A lot of this I think is just secondary to the lymphedema with that being said he does have some odor as well which may again just be due to more of the excessive moisture although I think that he may also have something that is a result of bacteria again he has somewhat of a Pseudomonas-like smell to the drainage and fortunately though he is not showing any signs of severe cellulitis he is still experiencing quite a bit of drainage and the discoloration is somewhat of a  yellow/green in color which is consistent with Pseudomonas. If he can tolerated I think he may do well with Cipro. 04/24/2019 on evaluation today patient appears to be doing well with regard to his wound in the gluteal region which is healing quite well. That is excellent news. With that being said he is also doing better with regard to his left lower extremity overall I feel like things are definitely showing signs of improvement. 12/28-Patient returns with regards to his gluteal wound that is improving, Left lower leg wounds with significant edema in the lower legs that appear to be persistent. Patient is in compression and has lymphedema pumps at home but apparently he does not use consistently 05/07/2019 Upon inspection patient's wound bed actually showed signs on evaluation today patient's wound currently showed signs of improvement. There is still a lot of maceration but it does look like that there is been some of this maceration occurring as a result of drainage in particular and some as a result of the fact that they have been putting bag balm on the wound area as well as the leg in general. I think this is applying too much moisture and not doing him any good. I discussed with the patient and his wife today that I think that practice needs to be adjusted I would recommend using a different type of lotion in order to help with moisture around the wound but I would not recommend anything directly on the wound itself. 05/14/2019 on evaluation today patient actually appears to be doing much better in regard to his lower extremity. They have not been using any of the bag balm on the area I think this has made a tremendous improvement overall in his wounds. He has a lot of new skin growth and though there still are a lot of open wounds scattered throughout the circumferential lower leg this overall seems to be doing much better to me. CHANLER, ARTIS ([id number]) 05/21/2019 upon evaluation today  patient appears to be doing well in regard to his lower extremity although he is having unfortunately some blue-green drainage noted at this point. The does have me concerned for a reinitiation of the Pseudomonas infection that we previously treated in December. The last time I gave him a prescription was April 16, 2019. That was a little over a month ago. Nonetheless he does have more green drainage noted  today and I think this is indicative of potentially a revving up infection. Obviously we do not want to get to that point. Objective Constitutional Well-nourished and well-hydrated in no acute distress. Vitals Time Taken: 1:50 PM, Height: 65 in, Weight: 273 lbs, BMI: 45.4, Temperature: 97.6 F, Pulse: 76 bpm, Respiratory Rate: 18 breaths/min, Blood Pressure: 150/56 mmHg. Respiratory normal breathing without difficulty. Psychiatric this patient is able to make decisions and demonstrates good insight into disease process. Alert and Oriented x 3. pleasant and cooperative. General Notes: Upon inspection patient's wound bed actually showed signs of some improvement which was good he did not show signs of severe cellulitis which is also good. However the drainage was definitely more blue/green in color as compared to even last week which does have me concerned. I do not want things to get out of control. Integumentary (Hair, Skin) Wound #6 status is Open. Original cause of wound was Gradually Appeared. The wound is located on the Left,Circumferential Lower Leg. The wound measures 11cm length x 34cm width x 0.1cm depth; 293.739cm^2 area and 29.374cm^3 volume. There is Fat Layer (Subcutaneous Tissue) Exposed exposed. There is no tunneling or undermining noted. There is a large amount of serous drainage noted. The wound margin is indistinct and nonvisible. There is large (67- 100%) pink granulation within the wound bed. There is a small (1-33%) amount of necrotic tissue within the wound  bed including Adherent Slough. Assessment Active Problems ICD-10 Lymphedema, not elsewhere classified Venous insufficiency (chronic) (peripheral) Non-pressure chronic ulcer of other part of left lower leg with fat layer exposed Essential (primary) hypertension Atherosclerotic heart disease of native coronary artery without angina pectoris Corns and callosities STEWARD, COST ([id number]) Procedures Wound #6 Pre-procedure diagnosis of Wound #6 is a Venous Leg Ulcer located on the Left,Circumferential Lower Leg . There was a Three Layer Compression Therapy Procedure by Army Melia, RN. Post procedure Diagnosis Wound #6: Same as Pre-Procedure Plan Wound Cleansing: Wound #6 Left,Circumferential Lower Leg: May shower with protection. No tub bath. Primary Wound Dressing: Wound #6 Left,Circumferential Lower Leg: Silver Alginate Secondary Dressing: Wound #6 Left,Circumferential Lower Leg: ABD pad Dressing Change Frequency: Wound #6 Left,Circumferential Lower Leg: Change Dressing Monday, Wednesday, Friday - Home Health to change dressings on Wednesdays and Fridays Follow-up Appointments: Wound #6 Left,Circumferential Lower Leg: Return Appointment in 1 week. Nurse Visit as needed Edema Control: Wound #6 Left,Circumferential Lower Leg: 3 Layer Compression System - Left Lower Extremity Off-Loading: Wound #6 Left,Circumferential Lower Leg: Turn and reposition every 2 hours Home Health: Wound #6 Left,Circumferential Lower Leg: [place] Nurse may visit PRN to address patient s wound care needs. FACE TO FACE ENCOUNTER: MEDICARE and MEDICAID PATIENTS: I certify that this patient is under my care and that I had a face-to-face encounter that meets the physician face-to-face encounter requirements with this patient on this date. The encounter with the patient was in whole or in part for the following MEDICAL CONDITION: (primary reason for  Short) MEDICAL NECESSITY: I certify, that based on my findings, NURSING services are a medically necessary home health service. HOME BOUND STATUS: I certify that my clinical findings support that this patient is homebound (i.e., Due to illness or injury, pt requires aid of supportive devices such as crutches, cane, wheelchairs, walkers, the use of special transportation or the assistance of another person to leave their place of residence. There is a normal inability to leave the home and doing so requires considerable and taxing effort.  Other absences are for medical reasons / religious services and are infrequent or of short duration when for other reasons). If current dressing causes regression in wound condition, may D/C ordered dressing product/s and apply Normal Saline Moist Dressing daily until next [place] / Other MD appointment. [place] of regression in [place], [place] ([id number]) wound condition at [phone]. Please direct any NON-WOUND related issues/requests for orders to patient's Primary Care Physician Medications-please add to medication list.: Wound #6 Left,Circumferential Lower Leg: P.O. Antibiotics - Cipro 1. My suggestion at this time is good to be that we go ahead and continue with the current wound care measures. With regard to the dressings I do believe the patient is making progress in this regard. That includes the silver alginate dressing to all open wound locations. 2. Would recommend as well that we continue with a 3 layer compression wrap I still feel like that has been beneficial for him. We will use ABD pads to control drainage as well that seems to be doing well. 3. I do recommend the patient continue to elevate his legs as much as possible. I did send in a prescription for Cipro for the patient as well. Hopefully that will help get the infection seems to be rubbing up at this point under control. We will see  patient back for reevaluation in 1 week here in the clinic. If anything worsens or changes patient will contact our office for additional recommendations. Electronic Signature(s) Signed: 05/21/2019 2:41:06 PM By: Worthy Keeler PA-C Entered By: Worthy Keeler on 05/21/2019 14:41:06 Steven Meza ([id number]) -------------------------------------------------------------------------------- SuperBill Details Patient Name: Steven Meza Date of Service: 05/21/2019 Medical Record Number: [id number] Patient Account Number: [id number] Date of Birth/Sex: [birth year] (86 y.o. M) Treating RN: Army Melia Primary Care Provider: Fulton Reek Other Clinician: Referring Provider: Fulton Reek Treating Provider/Extender: Melburn Hake, Hyden Soley Weeks in Treatment: 7 Diagnosis Coding ICD-10 Codes Meza Description I89.0 Lymphedema, not elsewhere classified I87.2 Venous insufficiency (chronic) (peripheral) L97.822 Non-pressure chronic ulcer of other part of left lower leg with fat layer exposed I10 Essential (primary) hypertension I25.10 Atherosclerotic heart disease of native coronary artery without angina pectoris L84 Corns and callosities Facility Procedures CPT4 Meza: [id number] Description: (Facility Use Only) [phone] - [facility] LWR LT LEG Modifier: Quantity: 1 Physician Procedures CPT4 Meza Description: [id number] - WC PHYS LEVEL 4 - EST PT ICD-10 Diagnosis Description I89.0 Lymphedema, not elsewhere classified I87.2 Venous insufficiency (chronic) (peripheral) L97.822 Non-pressure chronic ulcer of other part of left lower leg  wit I10 Essential (primary) hypertension Modifier: h fat layer expos Quantity: 1 ed Electronic Signature(s) Signed: 05/21/2019 2:41:27 PM By: Worthy Keeler PA-C Entered By: Worthy Keeler on 05/21/2019 14:41:27

## 2019-05-28 ENCOUNTER — Encounter: Payer: Medicare Other | Admitting: Physician Assistant

## 2019-05-28 ENCOUNTER — Other Ambulatory Visit: Payer: Self-pay

## 2019-05-28 DIAGNOSIS — L97822 Non-pressure chronic ulcer of other part of left lower leg with fat layer exposed: Secondary | ICD-10-CM | POA: Diagnosis not present

## 2019-05-28 NOTE — Progress Notes (Signed)
TRAVONTE, DUGGAL ([id number]) Visit Report for 05/28/2019 Arrival Information Details Patient Name: GRANTLAND, MUNNS Date of Service: 05/28/2019 1:45 PM Medical Record Number: [id number] Patient Account Number: [id number] Date of Birth/Sex: 02/25/1931 (86 y.o. M) Treating RN: Army Melia Primary Care Shavonn Convey: Fulton Reek Other Clinician: Referring Jamillah Camilo: Fulton Reek Treating Michelyn Scullin/Extender: Melburn Hake, HOYT Weeks in Treatment: 8 Visit Information History Since Last Visit Added or deleted any medications: No Patient Arrived: Walker Any new allergies or adverse reactions: No Arrival Time: 13:39 Had a fall or experienced change in No Accompanied By: wife activities of daily living that may affect Transfer Assistance: None risk of falls: Patient Identification Verified: Yes Signs or symptoms of abuse/neglect since last visito No Hospitalized since last visit: No Has Dressing in Place as Prescribed: Yes Pain Present Now: No Electronic Signature(s) Signed: 05/28/2019 4:02:47 PM By: Army Melia Entered By: Army Melia on 05/28/2019 13:39:36 [name] ([id number]) -------------------------------------------------------------------------------- Compression Therapy Details Patient Name: [name] Date of Service: 05/28/2019 1:45 PM Medical Record Number: [id number] Patient Account Number: [id number] Date of Birth/Sex: 09-Jul-1930 (86 y.o. M) Treating RN: Army Melia Primary Care Terryl Molinelli: Fulton Reek Other Clinician: Referring Kierstyn Baranowski: Fulton Reek Treating Stephane Niemann/Extender: Melburn Hake, HOYT Weeks in Treatment: 8 Compression Therapy Performed for Wound Assessment: Wound #6 Left,Circumferential Lower Leg Performed By: Clinician Army Melia, RN Compression Type: Three Layer Post Procedure Diagnosis Same as Pre-procedure Electronic Signature(s) Signed: 05/28/2019 4:02:47 PM By: Army Melia Entered By: Army Melia on 05/28/2019 13:55:08 [name] ([id number]) -------------------------------------------------------------------------------- Encounter Discharge Information Details Patient Name: [name] Date of Service: 05/28/2019 1:45 PM Medical Record Number: [id number] Patient Account Number: [id number] Date of Birth/Sex: 1930-05-22 (86 y.o. M) Treating RN: Army Melia Primary Care Nijel Flink: Fulton Reek Other Clinician: Referring Paysen Goza: Fulton Reek Treating Donia Yokum/Extender: Melburn Hake, HOYT Weeks in Treatment: 8 Encounter Discharge Information Items Discharge Condition: Stable Ambulatory Status: Walker Discharge Destination: Home Transportation: Private Auto Accompanied By: spouse Schedule Follow-up Appointment: Yes Clinical Summary of Care: Electronic Signature(s) Signed: 05/28/2019 4:02:47 PM By: Army Melia Entered By: Army Melia on 05/28/2019 13:56:52 [name] ([id number]) -------------------------------------------------------------------------------- Lower Extremity Assessment Details Patient Name: [name] Date of Service: 05/28/2019 1:45 PM Medical Record Number: [id number] Patient Account Number: [id number] Date of Birth/Sex: 07/28/1930 (86 y.o. M) Treating RN: Army Melia Primary Care Hayes Czaja: Fulton Reek Other Clinician: Referring Mahati Vajda: Fulton Reek Treating Amandalee Lacap/Extender: Melburn Hake, HOYT Weeks in Treatment: 8 Edema Assessment Assessed: [Left: No] [Right: No] Edema: [Left: Ye] [Right: s] Calf Left: Right: Point of Measurement: 33 cm From Medial Instep 44 cm cm Ankle Left: Right: Point of Measurement: 12 cm From Medial Instep 29 cm cm Vascular Assessment Pulses: Dorsalis Pedis Palpable: [Left:Yes] Electronic Signature(s) Signed: 05/28/2019 4:02:47 PM By: Army Melia Entered By: Army Melia on 05/28/2019 13:47:29 [name] ([id number]) -------------------------------------------------------------------------------- Multi Wound  Chart Details Patient Name: [name] Date of Service: 05/28/2019 1:45 PM Medical Record Number: [id number] Patient Account Number: [id number] Date of Birth/Sex: 05/22/1930 (86 y.o. M) Treating RN: Army Melia Primary Care Izayiah Tibbitts: Fulton Reek Other Clinician: Referring Anairis Knick: Fulton Reek Treating Samantha Olivera/Extender: Melburn Hake, HOYT Weeks in Treatment: 8 Vital Signs Height(in): 65 Pulse(bpm): 45 Weight(lbs): 273 Blood Pressure(mmHg): 141/52 Body Mass Index(BMI): 45 Temperature(F): 98.3 Respiratory Rate 16 (breaths/min): Photos: [N/A:N/A] Wound Location: Left Lower Leg - N/A N/A Circumferential Wounding Event: Gradually Appeared N/A N/A Primary Etiology: Venous Leg Ulcer N/A N/A Comorbid History: Lymphedema, Coronary Artery N/A N/A Disease, Hypertension Date Acquired: 03/02/2019 N/A N/A  Weeks of Treatment: 8 N/A N/A Wound Status: Open N/A N/A Measurements L x W x D 10x30x0.1 N/A N/A (cm) Area (cm) : 235.619 N/A N/A Volume (cm) : 23.562 N/A N/A % Reduction in Area: 14.50% N/A N/A % Reduction in Volume: 14.50% N/A N/A Classification: Partial Thickness N/A N/A Exudate Amount: Large N/A N/A Exudate Type: Serous N/A N/A Exudate Color: amber N/A N/A Wound Margin: Indistinct, nonvisible N/A N/A JUNUIS, VIDOVICH ([id number]) Granulation Amount: Large (67-100%) N/A N/A Granulation Quality: Pink N/A N/A Necrotic Amount: Small (1-33%) N/A N/A Exposed Structures: Fat Layer (Subcutaneous N/A N/A Tissue) Exposed: Yes Fascia: No Tendon: No Muscle: No Joint: No Bone: No Epithelialization: Small (1-33%) N/A N/A Treatment Notes Electronic Signature(s) Signed: 05/28/2019 4:02:47 PM By: Army Melia Entered By: Army Melia on 05/28/2019 13:53:31 [name] ([id number]) -------------------------------------------------------------------------------- Multi-Disciplinary Care Plan Details Patient Name: [name] Date of Service: 05/28/2019 1:45  PM Medical Record Number: [id number] Patient Account Number: [id number] Date of Birth/Sex: 1930-08-20 (86 y.o. M) Treating RN: Army Melia Primary Care Kristeena Meineke: Fulton Reek Other Clinician: Referring Jaydon Soroka: Fulton Reek Treating Thekla Colborn/Extender: Melburn Hake, HOYT Weeks in Treatment: 8 Active Inactive Abuse / Safety / Falls / Self Care Management Nursing Diagnoses: Potential for falls Goals: Patient will remain injury free related to falls Date Initiated: 04/24/2019 Target Resolution Date: 07/14/2019 Goal Status: Active Interventions: Assess fall risk on admission and as needed Notes: Venous Leg Ulcer Nursing Diagnoses: Actual venous Insuffiency (use after diagnosis is confirmed) Goals: Patient will maintain optimal edema control Date Initiated: 04/24/2019 Target Resolution Date: 07/14/2019 Goal Status: Active Interventions: Compression as ordered Notes: Electronic Signature(s) Signed: 05/28/2019 4:02:47 PM By: Army Melia Entered By: Army Melia on 05/28/2019 13:53:23 [name] ([id number]) -------------------------------------------------------------------------------- Pain Assessment Details Patient Name: [name] Date of Service: 05/28/2019 1:45 PM Medical Record Number: [id number] Patient Account Number: [id number] Date of Birth/Sex: 1930/06/02 (86 y.o. M) Treating RN: Army Melia Primary Care Kemonte Ullman: Fulton Reek Other Clinician: Referring Lacheryl Niesen: Fulton Reek Treating Dorion Petillo/Extender: Melburn Hake, HOYT Weeks in Treatment: 8 Active Problems Location of Pain Severity and Description of Pain Patient Has Paino No Site Locations Pain Management and Medication Current Pain Management: Electronic Signature(s) Signed: 05/28/2019 4:02:47 PM By: Army Melia Entered By: Army Melia on 05/28/2019 13:39:42 [name] ([id number]) -------------------------------------------------------------------------------- Patient/Caregiver  Education Details Patient Name: [name] Date of Service: 05/28/2019 1:45 PM Medical Record Number: [id number] Patient Account Number: [id number] Date of Birth/Gender: Dec 07, 1930 (86 y.o. M) Treating RN: Army Melia Primary Care Physician: Fulton Reek Other Clinician: Referring Physician: Fulton Reek Treating Physician/Extender: Sharalyn Ink in Treatment: 8 Education Assessment Education Provided To: Patient Education Topics Provided Wound/Skin Impairment: Handouts: Caring for Your Ulcer Methods: Demonstration, Explain/Verbal Responses: State content correctly Electronic Signature(s) Signed: 05/28/2019 4:02:47 PM By: Army Melia Entered By: Army Melia on 05/28/2019 13:56:26 [name] ([id number]) -------------------------------------------------------------------------------- Wound Assessment Details Patient Name: [name] Date of Service: 05/28/2019 1:45 PM Medical Record Number: [id number] Patient Account Number: [id number] Date of Birth/Sex: 10/23/1930 (86 y.o. M) Treating RN: Army Melia Primary Care Jamielee Mchale: Fulton Reek Other Clinician: Referring Ansen Sayegh: Fulton Reek Treating Mckenzi Buonomo/Extender: Melburn Hake, HOYT Weeks in Treatment: 8 Wound Status Wound Number: 6 Primary Venous Leg Ulcer Etiology: Wound Location: Left Lower Leg - Circumferential Wound Status: Open Wounding Event: Gradually Appeared Comorbid Lymphedema, Coronary Artery Disease, Date Acquired: 03/02/2019 History: Hypertension Weeks Of Treatment: 8 Clustered Wound: No Photos Wound Measurements Length: (cm) 10 Width: (cm) 30 Depth: (cm) 0.1 Area: (  cm) 235.619 Volume: (cm) 23.562 % Reduction in Area: 14.5% % Reduction in Volume: 14.5% Epithelialization: Small (1-33%) Wound Description Classification: Partial Thickness Foul Odor Wound Margin: Indistinct, nonvisible Slough/Fi Exudate Amount: Large Exudate Type: Serous Exudate Color: amber After  Cleansing: No brino Yes Wound Bed Granulation Amount: Large (67-100%) Exposed Structure Granulation Quality: Pink Fascia Exposed: No Necrotic Amount: Small (1-33%) Fat Layer (Subcutaneous Tissue) Exposed: Yes Necrotic Quality: Adherent Slough Tendon Exposed: No Muscle Exposed: No Joint Exposed: No Bone Exposed: No Treatment Notes LEVONNE, MAST ([id number]) Wound #6 (Left, Circumferential Lower Leg) Notes Scell, abd, 3 Layer left leg Electronic Signature(s) Signed: 05/28/2019 4:02:47 PM By: Army Melia Entered By: Army Melia on 05/28/2019 13:47:04 [name] ([id number]) -------------------------------------------------------------------------------- Vitals Details Patient Name: [name] Date of Service: 05/28/2019 1:45 PM Medical Record Number: [id number] Patient Account Number: [id number] Date of Birth/Sex: 02/11/1931 (86 y.o. M) Treating RN: Army Melia Primary Care Hiawatha Dressel: Fulton Reek Other Clinician: Referring Shuntia Exton: Fulton Reek Treating Mayzee Reichenbach/Extender: Melburn Hake, HOYT Weeks in Treatment: 8 Vital Signs Time Taken: 13:39 Temperature (F): 98.3 Height (in): 65 Pulse (bpm): 86 Weight (lbs): 273 Respiratory Rate (breaths/min): 16 Body Mass Index (BMI): 45.4 Blood Pressure (mmHg): 141/52 Reference Range: 80 - 120 mg / dl Electronic Signature(s) Signed: 05/28/2019 4:02:47 PM By: Army Melia Entered By: Army Melia on 05/28/2019 13:40:46

## 2019-05-28 NOTE — Progress Notes (Addendum)
Steven Meza, Steven Meza ([id number]) Visit Report for 05/28/2019 Chief Complaint Document Details Patient Name: Steven Meza Date of Service: 05/28/2019 1:45 PM Medical Record Number: [id number] Patient Account Number: [id number] Date of Birth/Sex: May 13, 1930 (86 y.o. M) Treating RN: Cornell Barman Primary Care Provider: Fulton Reek Other Clinician: Referring Provider: Fulton Reek Treating Provider/Extender: Melburn Hake, Blondell Laperle Weeks in Treatment: 8 Information Obtained from: Patient Chief Complaint Left LE Ulcer Electronic Signature(s) Signed: 05/28/2019 1:52:09 PM By: Worthy Keeler PA-C Entered By: Worthy Keeler on 05/28/2019 13:52:08 Steven Meza ([id number]) -------------------------------------------------------------------------------- HPI Details Patient Name: Steven Meza Date of Service: 05/28/2019 1:45 PM Medical Record Number: [id number] Patient Account Number: [id number] Date of Birth/Sex: [birth year] (86 y.o. M) Treating RN: Cornell Barman Primary Care Provider: Fulton Reek Other Clinician: Referring Provider: Fulton Reek Treating Provider/Extender: Melburn Hake, Carnelius Hammitt Weeks in Treatment: 8 History of Present Illness HPI Description: ADMISSION 05/17/2018 [name] is an 86 year old man who is been treated at the lymphedema clinic for a long period with lymphedema wraps for bilateral lower extremity lymphedema. About 6 months ago they noted small open areas on his left lateral calf just above the ankle. These were open. They are apparently putting some form of ointment on this. They have been referred here for our review of this. The patient has a long history of lymphedema and venous stasis. It says in his records that he has recurrent cellulitis although his wife denies this but she states that the lymphedema may have started with cellulitis several years ago. Also in his record there is a history of bladder cancer which they seem to know very little about however  he did not have any radiation to the pelvis or anything that could have contributed to lymphedema that they are aware of. There is no prior wound history. The patient has 2 small punched out areas on the left lateral calf that have adherent debris on the surface. Past medical history; lymphedema, hypertension, cellulitis, hearing loss, morbid obesity, osteoarthritis, venous stasis, bladder CA, diverticulosis. ABIs in our clinic were 0.36 on the right and 0.57 on the left 05/24/2018; corrected age on this patient is 53 versus what I said last week. He has been for his arterial studies which predictably are not very good. On the right his ABI is 0.65. Monophasic waveforms noted at the right ankle including the posterior tibial and dorsalis pedis. He has triphasic waveforms of the common femoral and profunda femoris triphasic proximal SFA with monophasic distal FSA and popliteal artery. Monophasic tibial waveforms. On the left his ABI is 0.58 monophasic waveforms at the left ankle. Duplex of the left lower extremity demonstrated atherosclerotic change with monophasic waveforms throughout the left lower extremity. There was some concern about left iliac disease and potentially femoral-popliteal and tibial disease. The patient does not describe claudication although according to his wife he over emphasizes his activity. Patient states he is limited by pain in both knees His wounds are on the left lateral calf. 2 small areas with necrotic debris. We have been using silver collagen and light Ace wraps 05/31/2018; wounds on the left lateral calf in the setting of very significant lymphedema and probably PAD. We have been using silver collagen. The base of the small wound looks reasonably improved. I sent him to see Dr. Fletcher Anon however I see now he has an appointment with Dr. Alvester Chou on February 12 2/5; left lateral calf wound looks the same. His wife is doing a good job with her lymphedema wraps and  maintaining that the edema. He most likely has PAD and is due to see Dr. Gwenlyn Found of infectious disease on February 12 2/19; left lateral calf wounds look about the same. This looks like wound secondary to chronic venous insufficiency with lymphedema however the patient has very poor arterial studies. We referred him to Dr. Gwenlyn Found. Dr. Gwenlyn Found did not feel he needed to do anything from an arterial point of view. He did not address the question about the aggressiveness of compression. After some thoughts about this I elected to go ahead and put him in 3 layer compression which after all would be less compression then the lymphedema clinic was putting on this. I am hopeful that this should be enough to get some closure of the small wounds 2/26; left lateral calf wound letter this week. He tolerated the 3 layer compression well furthermore he is sleeping in a hospital bed which helps keep his legs up at night. The wound looks better measuring smaller especially in width 3/3; left lateral calf wound about the same size. The 3 layer compression has really helped get the edema down in his leg however. Using silver collagen 3/11; left lateral calf wound better looking wound surface but about the same size. We have been using 3 layer compression which is really helped get the edema down in the left leg. Even after Dr. Kennon Holter assessment I am reluctant to go to 4 layer compression. He is tolerating 3 layer compression well. We have been using silver collagen Steven Meza, Steven Meza ([id number]) 3/18-Patient returns for his left lateral calf wound which overall is looking better, slightly increased in size and dimensions and area, he is tolerating the 3 layer compression, has been dressed with silver collagen which we will continue 3/25oleft lateral calf wound much better looking. Size is smaller. He has been using silver collagen 4/1; gradually getting smaller in size. Using silver collagen with 3 layer compression. I  think the patient has some degree of PAD. He saw Dr. Gwenlyn Found in consultation, he did not specifically comment on whether he could tolerate 4 layer compression 4/8; the wound is slightly smaller in depth. We are using 3 layer compression with silver collagen. I had him seen for arterial insufficiency however his ABI was 0.57 in the left leg. Dr. Gwenlyn Found really did not seem to middle on the degree of compression he could tolerate 4/15; the wound is slightly smaller and slightly less deep. We have been using 3 layer compression with silver collagen. There is not an option for 4-layer compression here. 4/22; the patient is making decent progress on his wound on the left lateral calf however he arrives in with a superficial wound on the right anterior tibial area. Were not really sure how this happened. He had been using a stocking on the right leg. He has been using silver collagen on the wounds Amazing to me he is he appears to have compression pumps at home. I am not really sure I knew this before. In any case he has not been using them 4/29; left lateral calf wound continues to get gradually smaller. This looks like it is on its way to closure. oThe area on the right anterior tibial area is about the same in terms of size superficial without any depth. This was probably wrap injury oHe tells me that he has been using his compression pumps once a day for 1 hour without any pain 5/6; not as good today. Left lateral calf wound is actually larger and  he does not have quite as good edema control. The area on the right anterior tibia is about the same still superficial. He states he is using his compression pumps once a day. We are using 3 layer compression on the left and Curlex Coban on the right 5/13; the area on the right is very close to being healed. The area on the left still has some depth but has a healthy wound surface. He has excellent edema control bilaterally. Using Hydrofera Blue 5/20-Patient  returns in 1 week after being in 3 layer compression on the left, the left lateral calf ulcer is stable with some slough, the right anterior tibial ulcer is healed we have been using Kerlix Coban on that leg 5/27; Hydrofera Blue and 3 layer compression. Still some slough in the wound area and some nonviable edges around the wound. 6/3; this patient has lymphedema and chronic venous insufficiency. He had difficult to close wound on his left lateral calf. This is closed today. 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 Finally he has known PAD but he is managed to tolerate the compression we reporting on him and also using his compression pumps that he already had at home twice a day 6/10; we discharge this patient last week having finally close the small area in the left lateral calf. Apparently by Friday of last week this had opened and was draining. He is therefore back in clinic. His wife who is present also was concerned about an area on the right anterior tibia."o 6/17; the patient does not have an open area on the right leg. On the left he has a small circular area anteriorly and the same area laterally that he had last time. However both of these appear to be improved he has his Farrow wrap 30/40. It turns out that he is only using his external compression pumps once a day. I tried to get him up to twice a day today 6/24; the patient comes in with his external compression garment on the right leg/Farrow wrap. As far as we know no open wounds here. He has 2 areas that are smaller one on the left anterior pretibial and one on the left lateral calf which is the site of his initial wounds. I have successfully caught him into his external compression pumps twice a day 7/1; still using his external compression garment on the right leg. As far as we know there are no open wounds here. The area on the left lateral calf which was the site of his initial wound has  closed over. The area on the left anterior pretibial that we identified last week is still open. 7/8; as far as we know he has no open wounds on the right leg. He has his external compression garment on here. The original wound on the left lateral calf has closed over. He developed an area on the left anterior pretibial 2 weeks ago and seems to have 2 smaller areas on the proximal and distal lateral calf. We do not have his good edema control this week as I am used to seeing 7/15; the patient has still a linear area on the left anterior tibia. The area on the left lateral leg looked somewhat inflamed today but no cellulitis. He did have what looked to be a small pustule that I cultured for CandS although I did not give him empiric antibiotics. 7/22; small area on the left anterior tibia. The area on the left  lateral leg still looks inflamed. The pustule that I cultured last week grew Enterobacter and Pseudomonas. I prescribed Cipro they still have not picked that up. In any case the area actually looks fairly satisfactory. He has decent edema control using his compression pumps twice a day 7/29; the area on the left lateral leg looks a lot better than last week. Nothing is open here. He has an area just medial to the mid anterior tibia that is still open that is only open wound today. 8/5-Patient returns at 1 week for the left leg ulcer, which is actually looking better, he is also complaining of left plantar foot pain below the great toe. He does have a hematoma here Steven Meza, Steven Meza ([id number]) 8/12-Patient returns at 1 week, the left leg open area looks about the same if not slightly better, the left plantar hematoma under the great toe is about the same, patient apparently has not been resting his foot on anything for too long period of time and does not wear the [employer]kenstock shoes unless he walks outdoors which he does not do often 8/19; small wound on the left anterior lower leg this is  just about closed there is nothing on the left lateral leg. He is using his compression pumps He arrives in clinic today with a black eschar over the right first plantar metatarsal head. This was apparently a bruise that was identified by her nurses last week. He wears crocs on his feet. He also may rub these on a footboard at home 8/26; there is nothing open on the left lateral lower leg he has a small area just medial to the tibia. He is going to see his podiatrist Dr. Milinda Pointer about the area on the left plantar first metatarsal head. He is using his external compression pumps. He has a juxta lite on the right leg 9/2; there is nothing open on either side of her left lower leg. He went to see podiatry about the area on the left first metatarsal head. This was apparently debrided. He was given mupirocin and oral antibiotics. I do not believe he is offloading this at all. Readmission: 04/02/2019 upon evaluation today patient actually appears for reevaluation here in our clinic secondary to issues that he is having with his left lower extremity. He has been tolerating the dressing changes without complication. Fortunately there is no signs of active infection at this time. No fevers, chills, nausea, vomiting, or diarrhea. He has been in the hospital since we last saw him and then subsequently transferred back to [facility]. He tells me that they have been changing his dressing once a day and that he has had a significant amount of pain secondary to undergoing these dressing changes. Fortunately there is no evidence of active infection at this point which is good news. No fevers, chills, nausea, vomiting, or diarrhea. With that being said he wonders if we can go back to doing what we were doing before which was when he actually had the dressing changes performed once a week here in the clinic he tells me that he did very well with that. At that point he tolerated a 3 layer compression wrap which  was approved by vascular, Dr. Tyrell Antonio office, to be used previous although 4-layer compression wrap is probably too much for him. 04/16/2019 on evaluation today patient appears to be doing decently well with regard to his lower extremity although he has had a lot of drainage from his leg. A lot of this I  think is just secondary to the lymphedema with that being said he does have some odor as well which may again just be due to more of the excessive moisture although I think that he may also have something that is a result of bacteria again he has somewhat of a Pseudomonas-like smell to the drainage and fortunately though he is not showing any signs of severe cellulitis he is still experiencing quite a bit of drainage and the discoloration is somewhat of a yellow/green in color which is consistent with Pseudomonas. If he can tolerated I think he may do well with Cipro. 04/24/2019 on evaluation today patient appears to be doing well with regard to his wound in the gluteal region which is healing quite well. That is excellent news. With that being said he is also doing better with regard to his left lower extremity overall I feel like things are definitely showing signs of improvement. 12/28-Patient returns with regards to his gluteal wound that is improving, Left lower leg wounds with significant edema in the lower legs that appear to be persistent. Patient is in compression and has lymphedema pumps at home but apparently he does not use consistently 05/07/2019 Upon inspection patient's wound bed actually showed signs on evaluation today patient's wound currently showed signs of improvement. There is still a lot of maceration but it does look like that there is been some of this maceration occurring as a result of drainage in particular and some as a result of the fact that they have been putting bag balm on the wound area as well as the leg in general. I think this is applying too much moisture and  not doing him any good. I discussed with the patient and his wife today that I think that practice needs to be adjusted I would recommend using a different type of lotion in order to help with moisture around the wound but I would not recommend anything directly on the wound itself. 05/14/2019 on evaluation today patient actually appears to be doing much better in regard to his lower extremity. They have not been using any of the bag balm on the area I think this has made a tremendous improvement overall in his wounds. He has a lot of new skin growth and though there still are a lot of open wounds scattered throughout the circumferential lower leg this overall seems to be doing much better to me. 05/21/2019 upon evaluation today patient appears to be doing well in regard to his lower extremity although he is having unfortunately some blue-green drainage noted at this point. The does have me concerned for a reinitiation of the Pseudomonas infection that we previously treated in December. The last time I gave him a prescription was April 16, 2019. That was a little over a month ago. Nonetheless he does have more green drainage noted today and I think this is indicative of potentially a revving up infection. Obviously we do not want to get to that point. Steven Meza, Steven Meza ([id number]) 05/28/2019 on evaluation today patient appears to be doing better with regard to his lower extremity at this point. Overall I am seeing signs of improvement which is good news. Fortunately there is no evidence of active infection at this time. I do believe that the Cipro has been beneficial for him that was prescribed last week for 14 days he is halfway through that and already appears to be doing much better. This is excellent news. Electronic Signature(s) Signed: 05/28/2019 2:03:27 PM By:  Melburn Hake, Brittan Mapel PA-C Entered By: Worthy Keeler on 05/28/2019 14:03:26 Steven Meza  ([id number]) -------------------------------------------------------------------------------- Physical Exam Details Patient Name: XAIDEN, Steven Meza Date of Service: 05/28/2019 1:45 PM Medical Record Number: [id number] Patient Account Number: [id number] Date of Birth/Sex: 03-06-1931 (86 y.o. M) Treating RN: Cornell Barman Primary Care Provider: Fulton Reek Other Clinician: Referring Provider: Fulton Reek Treating Provider/Extender: Melburn Hake, Caelan Branden Weeks in Treatment: 8 Constitutional Well-nourished and well-hydrated in no acute distress. Respiratory normal breathing without difficulty. Psychiatric this patient is able to make decisions and demonstrates good insight into disease process. Alert and Oriented x 3. pleasant and cooperative. Notes His wound bed currently showed signs of good epithelization in a lot of areas that have broken open last week have closed quite significantly. I am very pleased with the fact that the infection seems to be under better control and again I see no evidence of Systemic infection at this point. Electronic Signature(s) Signed: 05/28/2019 2:03:54 PM By: Worthy Keeler PA-C Entered By: Worthy Keeler on 05/28/2019 14:03:54 Steven Meza ([id number]) -------------------------------------------------------------------------------- Physician Orders Details Patient Name: Steven Meza Date of Service: 05/28/2019 1:45 PM Medical Record Number: [id number] Patient Account Number: [id number] Date of Birth/Sex: 06/07/1930 (86 y.o. M) Treating RN: Army Melia Primary Care Provider: Fulton Reek Other Clinician: Referring Provider: Fulton Reek Treating Provider/Extender: Melburn Hake, Mukhtar Shams Weeks in Treatment: 8 Verbal / Phone Orders: No Diagnosis Coding ICD-10 Coding Meza Description I89.0 Lymphedema, not elsewhere classified I87.2 Venous insufficiency (chronic) (peripheral) L97.822 Non-pressure chronic ulcer of other part of left lower leg with  fat layer exposed I10 Essential (primary) hypertension I25.10 Atherosclerotic heart disease of native coronary artery without angina pectoris L84 Corns and callosities Wound Cleansing Wound #6 Left,Circumferential Lower Leg o May shower with protection. o No tub bath. Primary Wound Dressing Wound #6 Left,Circumferential Lower Leg o Silver Alginate Secondary Dressing Wound #6 Left,Circumferential Lower Leg o ABD pad Dressing Change Frequency Wound #6 Left,Circumferential Lower Leg o Change Dressing Monday, Wednesday, Friday - Home Health to change dressings on Wednesdays and Fridays Follow-up Appointments Wound #6 Left,Circumferential Lower Leg o Return Appointment in 1 week. o Nurse Visit as needed Edema Control Wound #6 Left,Circumferential Lower Leg o 3 Layer Compression System - Left Lower Extremity Off-Loading Wound #6 Left,Circumferential Lower Leg o Turn and reposition every 2 hours Home Health Steven Meza, VLASIC ([id number]) Wound #6 Left,Circumferential Lower Leg o [place] Visits - [facility] o Home Health Nurse may visit PRN to address patientos wound care needs. o FACE TO FACE ENCOUNTER: MEDICARE and MEDICAID PATIENTS: I certify that this patient is under my care and that I had a face-to-face encounter that meets the physician face-to-face encounter requirements with this patient on this date. The encounter with the patient was in whole or in part for the following MEDICAL CONDITION: (primary reason for [place]) MEDICAL NECESSITY: I certify, that based on my findings, NURSING services are a medically necessary home health service. HOME BOUND STATUS: I certify that my clinical findings support that this patient is homebound (i.e., Due to illness or injury, pt requires aid of supportive devices such as crutches, cane, wheelchairs, walkers, the use of special transportation or the assistance of another person to leave their place  of residence. There is a normal inability to leave the home and doing so requires considerable and taxing effort. Other absences are for medical reasons / religious services and are infrequent or of short duration when for other reasons). o If current  dressing causes regression in wound condition, may D/C ordered dressing product/s and apply Normal Saline Moist Dressing daily until next [place] / Other MD appointment. [place] of regression in wound condition at [phone]. o Please direct any NON-WOUND related issues/requests for orders to patient's Primary Care Physician Electronic Signature(s) Signed: 05/28/2019 4:02:47 PM By: Army Melia Signed: 05/28/2019 4:16:35 PM By: Worthy Keeler PA-C Entered By: Army Melia on 05/28/2019 13:55:45 Steven Meza ([id number]) -------------------------------------------------------------------------------- Problem List Details Patient Name: Steven Meza Date of Service: 05/28/2019 1:45 PM Medical Record Number: [id number] Patient Account Number: [id number] Date of Birth/Sex: April 10, 1931 (86 y.o. M) Treating RN: Cornell Barman Primary Care Provider: Fulton Reek Other Clinician: Referring Provider: Fulton Reek Treating Provider/Extender: Melburn Hake, Kevin Space Weeks in Treatment: 8 Active Problems ICD-10 Evaluated Encounter Meza Description Active Date Today Diagnosis I89.0 Lymphedema, not elsewhere classified 04/02/2019 No Yes I87.2 Venous insufficiency (chronic) (peripheral) 04/02/2019 No Yes L97.822 Non-pressure chronic ulcer of other part of left lower leg with 04/02/2019 No Yes fat layer exposed I10 Essential (primary) hypertension 04/02/2019 No Yes I25.10 Atherosclerotic heart disease of native coronary artery 04/02/2019 No Yes without angina pectoris L84 Corns and callosities 05/09/2019 No Yes Inactive Problems Resolved Problems Electronic Signature(s) Signed: 05/28/2019 1:52:03 PM By: Worthy Keeler PA-C Entered By: Worthy Keeler on 05/28/2019 13:52:02 Steven Meza ([id number]) -------------------------------------------------------------------------------- Progress Note Details Patient Name: Steven Meza Date of Service: 05/28/2019 1:45 PM Medical Record Number: [id number] Patient Account Number: [id number] Date of Birth/Sex: 16-Mar-1931 (86 y.o. M) Treating RN: Cornell Barman Primary Care Provider: Fulton Reek Other Clinician: Referring Provider: Fulton Reek Treating Provider/Extender: Melburn Hake, Berkley Cronkright Weeks in Treatment: 8 Subjective Chief Complaint Information obtained from Patient Left LE Ulcer History of Present Illness (HPI) ADMISSION 05/17/2018 Mr. Steeno is an 86 year old man who is been treated at the lymphedema clinic for a long period with lymphedema wraps for bilateral lower extremity lymphedema. About 6 months ago they noted small open areas on his left lateral calf just above the ankle. These were open. They are apparently putting some form of ointment on this. They have been referred here for our review of this. The patient has a long history of lymphedema and venous stasis. It says in his records that he has recurrent cellulitis although his wife denies this but she states that the lymphedema may have started with cellulitis several years ago. Also in his record there is a history of bladder cancer which they seem to know very little about however he did not have any radiation to the pelvis or anything that could have contributed to lymphedema that they are aware of. There is no prior wound history. The patient has 2 small punched out areas on the left lateral calf that have adherent debris on the surface. Past medical history; lymphedema, hypertension, cellulitis, hearing loss, morbid obesity, osteoarthritis, venous stasis, bladder CA, diverticulosis. ABIs in our clinic were 0.36 on the right and 0.57 on the left 05/24/2018; corrected age on this  patient is 18 versus what I said last week. He has been for his arterial studies which predictably are not very good. On the right his ABI is 0.65. Monophasic waveforms noted at the right ankle including the posterior tibial and dorsalis pedis. He has triphasic waveforms of the common femoral and profunda femoris triphasic proximal SFA with monophasic distal FSA and popliteal artery. Monophasic tibial waveforms. On the left his ABI is 0.58 monophasic waveforms at the left ankle. Duplex of  the left lower extremity demonstrated atherosclerotic change with monophasic waveforms throughout the left lower extremity. There was some concern about left iliac disease and potentially femoral-popliteal and tibial disease. The patient does not describe claudication although according to his wife he over emphasizes his activity. Patient states he is limited by pain in both knees His wounds are on the left lateral calf. 2 small areas with necrotic debris. We have been using silver collagen and light Ace wraps 05/31/2018; wounds on the left lateral calf in the setting of very significant lymphedema and probably PAD. We have been using silver collagen. The base of the small wound looks reasonably improved. I sent him to see Dr. Fletcher Anon however I see now he has an appointment with Dr. Alvester Chou on February 12 2/5; left lateral calf wound looks the same. His wife is doing a good job with her lymphedema wraps and maintaining that the edema. He most likely has PAD and is due to see Dr. Gwenlyn Found of infectious disease on February 12 2/19; left lateral calf wounds look about the same. This looks like wound secondary to chronic venous insufficiency with lymphedema however the patient has very poor arterial studies. We referred him to Dr. Gwenlyn Found. Dr. Gwenlyn Found did not feel he needed to do anything from an arterial point of view. He did not address the question about the aggressiveness of compression. After some thoughts about this I  elected to go ahead and put him in 3 layer compression which after all would be less compression then the lymphedema clinic was putting on this. I am hopeful that this should be enough to get some closure of the small wounds 2/26; left lateral calf wound letter this week. He tolerated the 3 layer compression well furthermore he [facility] ([id number][facility] bed which helps keep his legs up at night. The wound looks better measuring smaller especially in width 3/3; left lateral calf wound about the same size. The 3 layer compression has really helped get the edema down in his leg however. Using silver collagen 3/11; left lateral calf wound better looking wound surface but about the same size. We have been using 3 layer compression which is really helped get the edema down in the left leg. Even after Dr. Kennon Holter assessment I am reluctant to go to 4 layer compression. He is tolerating 3 layer compression well. We have been using silver collagen 3/18-Patient returns for his left lateral calf wound which overall is looking better, slightly increased in size and dimensions and area, he is tolerating the 3 layer compression, has been dressed with silver collagen which we will continue 3/25 left lateral calf wound much better looking. Size is smaller. He has been using silver collagen 4/1; gradually getting smaller in size. Using silver collagen with 3 layer compression. I think the patient has some degree of PAD. He saw Dr. Gwenlyn Found in consultation, he did not specifically comment on whether he could tolerate 4 layer compression 4/8; the wound is slightly smaller in depth. We are using 3 layer compression with silver collagen. I had him seen for arterial insufficiency however his ABI was 0.57 in the left leg. Dr. Gwenlyn Found really did not seem to middle on the degree of compression he could tolerate 4/15; the wound is slightly smaller and slightly less deep. We have been using 3 layer  compression with silver collagen. There is not an option for 4-layer compression here. 4/22; the patient is making decent progress on his wound  on the left lateral calf however he arrives in with a superficial wound on the right anterior tibial area. Were not really sure how this happened. He had been using a stocking on the right leg. He has been using silver collagen on the wounds Amazing to me he is he appears to have compression pumps at home. I am not really sure I knew this before. In any case he has not been using them 4/29; left lateral calf wound continues to get gradually smaller. This looks like it is on its way to closure. The area on the right anterior tibial area is about the same in terms of size superficial without any depth. This was probably wrap injury He tells me that he has been using his compression pumps once a day for 1 hour without any pain 5/6; not as good today. Left lateral calf wound is actually larger and he does not have quite as good edema control. The area on the right anterior tibia is about the same still superficial. He states he is using his compression pumps once a day. We are using 3 layer compression on the left and Curlex Coban on the right 5/13; the area on the right is very close to being healed. The area on the left still has some depth but has a healthy wound surface. He has excellent edema control bilaterally. Using Hydrofera Blue 5/20-Patient returns in 1 week after being in 3 layer compression on the left, the left lateral calf ulcer is stable with some slough, the right anterior tibial ulcer is healed we have been using Kerlix Coban on that leg 5/27; Hydrofera Blue and 3 layer compression. Still some slough in the wound area and some nonviable edges around the wound. 6/3; this patient has lymphedema and chronic venous insufficiency. He had difficult to close wound on his left lateral calf. This is closed today. He has also chronic stasis  dermatitis and xerotic skin in his lower extremities. Finally he has known PAD but he is managed to tolerate the compression we reporting on him and also using his compression pumps that he already had at home twice a day 6/10; we discharge this patient last week having finally close the small area in the left lateral calf. Apparently by Friday of last week this had opened and was draining. He is therefore back in clinic. His wife who is present also was concerned about an area on the right anterior tibia."o 6/17; the patient does not have an open area on the right leg. On the left he has a small circular area anteriorly and the same area laterally that he had last time. However both of these appear to be improved he has his Farrow wrap 30/40. It turns out that he is only using his external compression pumps once a day. I tried to get him up to twice a day today 6/24; the patient comes in with his external compression garment on the right leg/Farrow wrap. As far as we know no open wounds here. He has 2 areas that are smaller one on the left anterior pretibial and one on the left lateral calf which is the site of his initial wounds. I have successfully caught him into his external compression pumps twice a day 7/1; still using his external compression garment on the right leg. As far as we know there are no open wounds here. The area on the left lateral calf which was the site of his initial wound has closed over. The  area on the left anterior pretibial that we identified last week is still open. 7/8; as far as we know he has no open wounds on the right leg. He has his external compression garment on here. The original wound on the left lateral calf has closed over. He developed an area on the left anterior pretibial 2 weeks ago and seems to have 2 smaller areas on the proximal and distal lateral calf. We do not have his good edema control this week as I am used to seeing 7/15; the patient has  still a linear area on the left anterior tibia. The area on the left lateral leg looked somewhat inflamed today but no cellulitis. He did have what looked to be a small pustule that I cultured for CandS although I did not give him empiric antibiotics. 7/22; small area on the left anterior tibia. The area on the left lateral leg still looks inflamed. The pustule that I cultured last Steven Meza, Steven Meza. ([id number]) week grew Enterobacter and Pseudomonas. I prescribed Cipro they still have not picked that up. In any case the area actually looks fairly satisfactory. He has decent edema control using his compression pumps twice a day 7/29; the area on the left lateral leg looks a lot better than last week. Nothing is open here. He has an area just medial to the mid anterior tibia that is still open that is only open wound today. 8/5-Patient returns at 1 week for the left leg ulcer, which is actually looking better, he is also complaining of left plantar foot pain below the great toe. He does have a hematoma here 8/12-Patient returns at 1 week, the left leg open area looks about the same if not slightly better, the left plantar hematoma under the great toe is about the same, patient apparently has not been resting his foot on anything for too long period of time and does not wear the [employer] shoes unless he walks outdoors which he does not do often 8/19; small wound on the left anterior lower leg this is just about closed there is nothing on the left lateral leg. He is using his compression pumps He arrives in clinic today with a black eschar over the right first plantar metatarsal head. This was apparently a bruise that was identified by her nurses last week. He wears crocs on his feet. He also may rub these on a footboard at home 8/26; there is nothing open on the left lateral lower leg he has a small area just medial to the tibia. He is going to see his podiatrist Dr. Milinda Pointer about the area on the  left plantar first metatarsal head. He is using his external compression pumps. He has a juxta lite on the right leg 9/2; there is nothing open on either side of her left lower leg. He went to see podiatry about the area on the left first metatarsal head. This was apparently debrided. He was given mupirocin and oral antibiotics. I do not believe he is offloading this at all. Readmission: 04/02/2019 upon evaluation today patient actually appears for reevaluation here in our clinic secondary to issues that he is having with his left lower extremity. He has been tolerating the dressing changes without complication. Fortunately there is no signs of active infection at this time. No fevers, chills, nausea, vomiting, or diarrhea. He has been in the hospital since we last saw him and then subsequently transferred back to [facility]. He tells me that they have been changing  his dressing once a day and that he has had a significant amount of pain secondary to undergoing these dressing changes. Fortunately there is no evidence of active infection at this point which is good news. No fevers, chills, nausea, vomiting, or diarrhea. With that being said he wonders if we can go back to doing what we were doing before which was when he actually had the dressing changes performed once a week here in the clinic he tells me that he did very well with that. At that point he tolerated a 3 layer compression wrap which was approved by vascular, Dr. Tyrell Antonio office, to be used previous although 4-layer compression wrap is probably too much for him. 04/16/2019 on evaluation today patient appears to be doing decently well with regard to his lower extremity although he has had a lot of drainage from his leg. A lot of this I think is just secondary to the lymphedema with that being said he does have some odor as well which may again just be due to more of the excessive moisture although I think that he may also  have something that is a result of bacteria again he has somewhat of a Pseudomonas-like smell to the drainage and fortunately though he is not showing any signs of severe cellulitis he is still experiencing quite a bit of drainage and the discoloration is somewhat of a yellow/green in color which is consistent with Pseudomonas. If he can tolerated I think he may do well with Cipro. 04/24/2019 on evaluation today patient appears to be doing well with regard to his wound in the gluteal region which is healing quite well. That is excellent news. With that being said he is also doing better with regard to his left lower extremity overall I feel like things are definitely showing signs of improvement. 12/28-Patient returns with regards to his gluteal wound that is improving, Left lower leg wounds with significant edema in the lower legs that appear to be persistent. Patient is in compression and has lymphedema pumps at home but apparently he does not use consistently 05/07/2019 Upon inspection patient's wound bed actually showed signs on evaluation today patient's wound currently showed signs of improvement. There is still a lot of maceration but it does look like that there is been some of this maceration occurring as a result of drainage in particular and some as a result of the fact that they have been putting bag balm on the wound area as well as the leg in general. I think this is applying too much moisture and not doing him any good. I discussed with the patient and his wife today that I think that practice needs to be adjusted I would recommend using a different type of lotion in order to help with moisture around the wound but I would not recommend anything directly on the wound itself. 05/14/2019 on evaluation today patient actually appears to be doing much better in regard to his lower extremity. They have not been using any of the bag balm on the area I think this has made a tremendous  improvement overall in his wounds. He has a lot of new skin growth and though there still are a lot of open wounds scattered throughout the circumferential lower leg this overall seems to be doing much better to me. Steven Meza, Steven Meza ([id number]) 05/21/2019 upon evaluation today patient appears to be doing well in regard to his lower extremity although he is having unfortunately some blue-green drainage noted at this  point. The does have me concerned for a reinitiation of the Pseudomonas infection that we previously treated in December. The last time I gave him a prescription was April 16, 2019. That was a little over a month ago. Nonetheless he does have more green drainage noted today and I think this is indicative of potentially a revving up infection. Obviously we do not want to get to that point. 05/28/2019 on evaluation today patient appears to be doing better with regard to his lower extremity at this point. Overall I am seeing signs of improvement which is good news. Fortunately there is no evidence of active infection at this time. I do believe that the Cipro has been beneficial for him that was prescribed last week for 14 days he is halfway through that and already appears to be doing much better. This is excellent news. Objective Constitutional Well-nourished and well-hydrated in no acute distress. Vitals Time Taken: 1:39 PM, Height: 65 in, Weight: 273 lbs, BMI: 45.4, Temperature: 98.3 F, Pulse: 86 bpm, Respiratory Rate: 16 breaths/min, Blood Pressure: 141/52 mmHg. Respiratory normal breathing without difficulty. Psychiatric this patient is able to make decisions and demonstrates good insight into disease process. Alert and Oriented x 3. pleasant and cooperative. General Notes: His wound bed currently showed signs of good epithelization in a lot of areas that have broken open last week have closed quite significantly. I am very pleased with the fact that the infection seems to be  under better control and again I see no evidence of Systemic infection at this point. Integumentary (Hair, Skin) Wound #6 status is Open. Original cause of wound was Gradually Appeared. The wound is located on the Left,Circumferential Lower Leg. The wound measures 10cm length x 30cm width x 0.1cm depth; 235.619cm^2 area and 23.562cm^3 volume. There is Fat Layer (Subcutaneous Tissue) Exposed exposed. There is a large amount of serous drainage noted. The wound margin is indistinct and nonvisible. There is large (67-100%) pink granulation within the wound bed. There is a small (1-33%) amount of necrotic tissue within the wound bed including Adherent Slough. Assessment Active Problems ICD-10 Lymphedema, not elsewhere classified Venous insufficiency (chronic) (peripheral) Steven Meza, Steven Meza. ([id number]) Non-pressure chronic ulcer of other part of left lower leg with fat layer exposed Essential (primary) hypertension Atherosclerotic heart disease of native coronary artery without angina pectoris Corns and callosities Procedures Wound #6 Pre-procedure diagnosis of Wound #6 is a Venous Leg Ulcer located on the Left,Circumferential Lower Leg . There was a Three Layer Compression Therapy Procedure by Army Melia, RN. Post procedure Diagnosis Wound #6: Same as Pre-Procedure Plan Wound Cleansing: Wound #6 Left,Circumferential Lower Leg: May shower with protection. No tub bath. Primary Wound Dressing: Wound #6 Left,Circumferential Lower Leg: Silver Alginate Secondary Dressing: Wound #6 Left,Circumferential Lower Leg: ABD pad Dressing Change Frequency: Wound #6 Left,Circumferential Lower Leg: Change Dressing Monday, Wednesday, Friday - Home Health to change dressings on Wednesdays and Fridays Follow-up Appointments: Wound #6 Left,Circumferential Lower Leg: Return Appointment in 1 week. Nurse Visit as needed Edema Control: Wound #6 Left,Circumferential Lower Leg: 3 Layer Compression System  - Left Lower Extremity Off-Loading: Wound #6 Left,Circumferential Lower Leg: Turn and reposition every 2 hours Home Health: Wound #6 Left,Circumferential Lower Leg: [place] Nurse may visit PRN to address patient s wound care needs. FACE TO FACE ENCOUNTER: MEDICARE and MEDICAID PATIENTS: I certify that this patient is under my care and that I had a face-to-face encounter that meets the physician face-to-face encounter requirements  with this patient on this date. The encounter with the patient was in whole or in part for the following MEDICAL CONDITION: (primary reason for [place]) MEDICAL NECESSITY: I certify, that based on my findings, NURSING services are a medically necessary home health service. HOME BOUND STATUS: I certify that my clinical findings support that this patient is homebound (i.e., Due to illness or injury, pt requires aid of supportive devices such as crutches, cane, wheelchairs, walkers, the use of special transportation or the assistance of another person to leave their place of residence. There is a normal inability to leave the Steven Meza, Steven Meza. ([id number]) home and doing so requires considerable and taxing effort. Other absences are for medical reasons / religious services and are infrequent or of short duration when for other reasons). If current dressing causes regression in wound condition, may D/C ordered dressing product/s and apply Normal Saline Moist Dressing daily until next [place] / Other MD appointment. [place] of regression in wound condition at [phone]. Please direct any NON-WOUND related issues/requests for orders to patient's Primary Care Physician 1. My suggestion at this time is good to be that we go ahead and continue with the current wound care measures which includes utilization of the silver alginate I feel like that is done well for him. 2. The patient will  continue to take the antibiotic currently which is Cipro that I prescribed for him last week. I think that is done extremely well on the lower extremities seems to be doing much better none of the green/blue drainage is noted today. 3. We will continue with the 3 layer compression wrap as well. 4. I am also going to suggest that we continue to have the patient elevate his legs as much as possible. We will see patient back for reevaluation in 1 week here in the clinic. If anything worsens or changes patient will contact our office for additional recommendations. Electronic Signature(s) Signed: 05/28/2019 2:04:44 PM By: Worthy Keeler PA-C Entered By: Worthy Keeler on 05/28/2019 14:04:44 Steven Meza ([id number]) -------------------------------------------------------------------------------- SuperBill Details Patient Name: Steven Meza Date of Service: 05/28/2019 Medical Record Number: [id number] Patient Account Number: [id number] Date of Birth/Sex: 03/18/1931 (86 y.o. M) Treating RN: Army Melia Primary Care Provider: Fulton Reek Other Clinician: Referring Provider: Fulton Reek Treating Provider/Extender: Melburn Hake, Latica Hohmann Weeks in Treatment: 8 Diagnosis Coding ICD-10 Codes Meza Description I89.0 Lymphedema, not elsewhere classified I87.2 Venous insufficiency (chronic) (peripheral) L97.822 Non-pressure chronic ulcer of other part of left lower leg with fat layer exposed I10 Essential (primary) hypertension I25.10 Atherosclerotic heart disease of native coronary artery without angina pectoris L84 Corns and callosities Facility Procedures CPT4 Meza: [id number] Description: (Facility Use Only) [phone] - [place] LWR LT LEG Modifier: Quantity: 1 Physician Procedures CPT4 Meza Description: [id number] - WC PHYS LEVEL 3 - EST PT ICD-10 Diagnosis Description I89.0 Lymphedema, not elsewhere classified I87.2 Venous insufficiency (chronic) (peripheral) L97.822  Non-pressure chronic ulcer of other part of left lower leg  wit I10 Essential (primary) hypertension Modifier: h fat layer expos Quantity: 1 ed Electronic Signature(s) Signed: 05/28/2019 2:04:58 PM By: Worthy Keeler PA-C Entered By: Worthy Keeler on 05/28/2019 14:04:58

## 2019-06-04 ENCOUNTER — Other Ambulatory Visit: Payer: Self-pay

## 2019-06-04 ENCOUNTER — Encounter: Payer: Medicare Other | Attending: Physician Assistant | Admitting: Physician Assistant

## 2019-06-04 DIAGNOSIS — I878 Other specified disorders of veins: Secondary | ICD-10-CM | POA: Insufficient documentation

## 2019-06-04 DIAGNOSIS — L97822 Non-pressure chronic ulcer of other part of left lower leg with fat layer exposed: Secondary | ICD-10-CM | POA: Diagnosis present

## 2019-06-04 DIAGNOSIS — M79672 Pain in left foot: Secondary | ICD-10-CM | POA: Diagnosis not present

## 2019-06-04 DIAGNOSIS — L84 Corns and callosities: Secondary | ICD-10-CM | POA: Diagnosis not present

## 2019-06-04 DIAGNOSIS — I2510 Atherosclerotic heart disease of native coronary artery without angina pectoris: Secondary | ICD-10-CM | POA: Insufficient documentation

## 2019-06-04 DIAGNOSIS — M1990 Unspecified osteoarthritis, unspecified site: Secondary | ICD-10-CM | POA: Diagnosis not present

## 2019-06-04 DIAGNOSIS — Z6841 Body Mass Index (BMI) 40.0 and over, adult: Secondary | ICD-10-CM | POA: Insufficient documentation

## 2019-06-04 DIAGNOSIS — I872 Venous insufficiency (chronic) (peripheral): Secondary | ICD-10-CM | POA: Diagnosis not present

## 2019-06-04 DIAGNOSIS — I890 Lymphedema, not elsewhere classified: Secondary | ICD-10-CM | POA: Diagnosis not present

## 2019-06-04 DIAGNOSIS — Z8551 Personal history of malignant neoplasm of bladder: Secondary | ICD-10-CM | POA: Diagnosis not present

## 2019-06-04 DIAGNOSIS — K5790 Diverticulosis of intestine, part unspecified, without perforation or abscess without bleeding: Secondary | ICD-10-CM | POA: Insufficient documentation

## 2019-06-04 DIAGNOSIS — I10 Essential (primary) hypertension: Secondary | ICD-10-CM | POA: Diagnosis not present

## 2019-06-04 NOTE — Progress Notes (Addendum)
Steven Meza, Steven Meza ([id number]) Visit Report for 06/04/2019 Chief Complaint Document Details Patient Name: Steven Meza Date of Service: 06/04/2019 1:45 PM Medical Record Number: [id number] Patient Account Number: [id number] Date of Birth/Sex: 02-03-1931 (86 y.o. M) Treating RN: Cornell Barman Primary Care Provider: Fulton Reek Other Clinician: Referring Provider: Fulton Reek Treating Provider/Extender: Melburn Hake, Kyrie Fludd Weeks in Treatment: 9 Information Obtained from: Patient Chief Complaint Left LE Ulcer Electronic Signature(s) Signed: 06/04/2019 1:40:02 PM By: Worthy Keeler PA-C Entered By: Worthy Keeler on 06/04/2019 13:40:02 Steven Meza ([id number]) -------------------------------------------------------------------------------- HPI Details Patient Name: Steven Meza Date of Service: 06/04/2019 1:45 PM Medical Record Number: [id number] Patient Account Number: [id number] Date of Birth/Sex: 1931/05/03 (86 y.o. M) Treating RN: Cornell Barman Primary Care Provider: Fulton Reek Other Clinician: Referring Provider: Fulton Reek Treating Provider/Extender: Melburn Hake, Sharnice Bosler Weeks in Treatment: 9 History of Present Illness HPI Description: ADMISSION 05/17/2018 [name] is an 86 year old man who is been treated at the lymphedema clinic for a long period with lymphedema wraps for bilateral lower extremity lymphedema. About 6 months ago they noted small open areas on his left lateral calf just above the ankle. These were open. They are apparently putting some form of ointment on this. They have been referred here for our review of this. The patient has a long history of lymphedema and venous stasis. It says in his records that he has recurrent cellulitis although his wife denies this but she states that the lymphedema may have started with cellulitis several years ago. Also in his record there is a history of bladder cancer which they seem to know very little about however he  did not have any radiation to the pelvis or anything that could have contributed to lymphedema that they are aware of. There is no prior wound history. The patient has 2 small punched out areas on the left lateral calf that have adherent debris on the surface. Past medical history; lymphedema, hypertension, cellulitis, hearing loss, morbid obesity, osteoarthritis, venous stasis, bladder CA, diverticulosis. ABIs in our clinic were 0.36 on the right and 0.57 on the left 05/24/2018; corrected age on this patient is 77 versus what I said last week. He has been for his arterial studies which predictably are not very good. On the right his ABI is 0.65. Monophasic waveforms noted at the right ankle including the posterior tibial and dorsalis pedis. He has triphasic waveforms of the common femoral and profunda femoris triphasic proximal SFA with monophasic distal FSA and popliteal artery. Monophasic tibial waveforms. On the left his ABI is 0.58 monophasic waveforms at the left ankle. Duplex of the left lower extremity demonstrated atherosclerotic change with monophasic waveforms throughout the left lower extremity. There was some concern about left iliac disease and potentially femoral-popliteal and tibial disease. The patient does not describe claudication although according to his wife he over emphasizes his activity. Patient states he is limited by pain in both knees His wounds are on the left lateral calf. 2 small areas with necrotic debris. We have been using silver collagen and light Ace wraps 05/31/2018; wounds on the left lateral calf in the setting of very significant lymphedema and probably PAD. We have been using silver collagen. The base of the small wound looks reasonably improved. I sent him to see Dr. Fletcher Anon however I see now he has an appointment with Dr. Alvester Chou on February 12 2/5; left lateral calf wound looks the same. His wife is doing a good job with her lymphedema wraps and  maintaining  that the edema. He most likely has PAD and is due to see Dr. Gwenlyn Found of infectious disease on February 12 2/19; left lateral calf wounds look about the same. This looks like wound secondary to chronic venous insufficiency with lymphedema however the patient has very poor arterial studies. We referred him to Dr. Gwenlyn Found. Dr. Gwenlyn Found did not feel he needed to do anything from an arterial point of view. He did not address the question about the aggressiveness of compression. After some thoughts about this I elected to go ahead and put him in 3 layer compression which after all would be less compression then the lymphedema clinic was putting on this. I am hopeful that this should be enough to get some closure of the small wounds 2/26; left lateral calf wound letter this week. He tolerated the 3 layer compression well furthermore he is sleeping in a hospital bed which helps keep his legs up at night. The wound looks better measuring smaller especially in width 3/3; left lateral calf wound about the same size. The 3 layer compression has really helped get the edema down in his leg however. Using silver collagen 3/11; left lateral calf wound better looking wound surface but about the same size. We have been using 3 layer compression which is really helped get the edema down in the left leg. Even after Dr. Kennon Holter assessment I am reluctant to go to 4 layer compression. He is tolerating 3 layer compression well. We have been using silver collagen Steven Meza, Steven Meza ([id number]) 3/18-Patient returns for his left lateral calf wound which overall is looking better, slightly increased in size and dimensions and area, he is tolerating the 3 layer compression, has been dressed with silver collagen which we will continue 3/25oleft lateral calf wound much better looking. Size is smaller. He has been using silver collagen 4/1; gradually getting smaller in size. Using silver collagen with 3 layer compression. I think the  patient has some degree of PAD. He saw Dr. Gwenlyn Found in consultation, he did not specifically comment on whether he could tolerate 4 layer compression 4/8; the wound is slightly smaller in depth. We are using 3 layer compression with silver collagen. I had him seen for arterial insufficiency however his ABI was 0.57 in the left leg. Dr. Gwenlyn Found really did not seem to middle on the degree of compression he could tolerate 4/15; the wound is slightly smaller and slightly less deep. We have been using 3 layer compression with silver collagen. There is not an option for 4-layer compression here. 4/22; the patient is making decent progress on his wound on the left lateral calf however he arrives in with a superficial wound on the right anterior tibial area. Were not really sure how this happened. He had been using a stocking on the right leg. He has been using silver collagen on the wounds Amazing to me he is he appears to have compression pumps at home. I am not really sure I knew this before. In any case he has not been using them 4/29; left lateral calf wound continues to get gradually smaller. This looks like it is on its way to closure. oThe area on the right anterior tibial area is about the same in terms of size superficial without any depth. This was probably wrap injury oHe tells me that he has been using his compression pumps once a day for 1 hour without any pain 5/6; not as good today. Left lateral calf wound is actually larger  and he does not have quite as good edema control. The area on the right anterior tibia is about the same still superficial. He states he is using his compression pumps once a day. We are using 3 layer compression on the left and Curlex Coban on the right 5/13; the area on the right is very close to being healed. The area on the left still has some depth but has a healthy wound surface. He has excellent edema control bilaterally. Using Hydrofera Blue 5/20-Patient returns  in 1 week after being in 3 layer compression on the left, the left lateral calf ulcer is stable with some slough, the right anterior tibial ulcer is healed we have been using Kerlix Coban on that leg 5/27; Hydrofera Blue and 3 layer compression. Still some slough in the wound area and some nonviable edges around the wound. 6/3; this patient has lymphedema and chronic venous insufficiency. He had difficult to close wound on his left lateral calf. This is closed today. 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 Finally he has known PAD but he is managed to tolerate the compression we reporting on him and also using his compression pumps that he already had at home twice a day 6/10; we discharge this patient last week having finally close the small area in the left lateral calf. Apparently by Friday of last week this had opened and was draining. He is therefore back in clinic. His wife who is present also was concerned about an area on the right anterior tibia."o 6/17; the patient does not have an open area on the right leg. On the left he has a small circular area anteriorly and the same area laterally that he had last time. However both of these appear to be improved he has his Farrow wrap 30/40. It turns out that he is only using his external compression pumps once a day. I tried to get him up to twice a day today 6/24; the patient comes in with his external compression garment on the right leg/Farrow wrap. As far as we know no open wounds here. He has 2 areas that are smaller one on the left anterior pretibial and one on the left lateral calf which is the site of his initial wounds. I have successfully caught him into his external compression pumps twice a day 7/1; still using his external compression garment on the right leg. As far as we know there are no open wounds here. The area on the left lateral calf which was the site of his initial wound has closed  over. The area on the left anterior pretibial that we identified last week is still open. 7/8; as far as we know he has no open wounds on the right leg. He has his external compression garment on here. The original wound on the left lateral calf has closed over. He developed an area on the left anterior pretibial 2 weeks ago and seems to have 2 smaller areas on the proximal and distal lateral calf. We do not have his good edema control this week as I am used to seeing 7/15; the patient has still a linear area on the left anterior tibia. The area on the left lateral leg looked somewhat inflamed today but no cellulitis. He did have what looked to be a small pustule that I cultured for CandS although I did not give him empiric antibiotics. 7/22; small area on the left anterior tibia. The area on the  left lateral leg still looks inflamed. The pustule that I cultured last week grew Enterobacter and Pseudomonas. I prescribed Cipro they still have not picked that up. In any case the area actually looks fairly satisfactory. He has decent edema control using his compression pumps twice a day 7/29; the area on the left lateral leg looks a lot better than last week. Nothing is open here. He has an area just medial to the mid anterior tibia that is still open that is only open wound today. 8/5-Patient returns at 1 week for the left leg ulcer, which is actually looking better, he is also complaining of left plantar foot pain below the great toe. He does have a hematoma here Steven Meza, Steven Meza ([id number]) 8/12-Patient returns at 1 week, the left leg open area looks about the same if not slightly better, the left plantar hematoma under the great toe is about the same, patient apparently has not been resting his foot on anything for too long period of time and does not wear the [employer]ken[employer] shoes unless he walks outdoors which he does not do often 8/19; small wound on the left anterior lower leg this is just about  closed there is nothing on the left lateral leg. He is using his compression pumps He arrives in clinic today with a black eschar over the right first plantar metatarsal head. This was apparently a bruise that was identified by her nurses last week. He wears crocs on his feet. He also may rub these on a footboard at home 8/26; there is nothing open on the left lateral lower leg he has a small area just medial to the tibia. He is going to see his podiatrist Dr. Milinda Pointer about the area on the left plantar first metatarsal head. He is using his external compression pumps. He has a juxta lite on the right leg 9/2; there is nothing open on either side of her left lower leg. He went to see podiatry about the area on the left first metatarsal head. This was apparently debrided. He was given mupirocin and oral antibiotics. I do not believe he is offloading this at all. Readmission: 04/02/2019 upon evaluation today patient actually appears for reevaluation here in our clinic secondary to issues that he is having with his left lower extremity. He has been tolerating the dressing changes without complication. Fortunately there is no signs of active infection at this time. No fevers, chills, nausea, vomiting, or diarrhea. He has been in the hospital since we last saw him and then subsequently transferred back to [facility]. He tells me that they have been changing his dressing once a day and that he has had a significant amount of pain secondary to undergoing these dressing changes. Fortunately there is no evidence of active infection at this point which is good news. No fevers, chills, nausea, vomiting, or diarrhea. With that being said he wonders if we can go back to doing what we were doing before which was when he actually had the dressing changes performed once a week here in the clinic he tells me that he did very well with that. At that point he tolerated a 3 layer compression wrap which was approved  by vascular, Dr. Tyrell Antonio office, to be used previous although 4-layer compression wrap is probably too much for him. 04/16/2019 on evaluation today patient appears to be doing decently well with regard to his lower extremity although he has had a lot of drainage from his leg. A lot of this  I think is just secondary to the lymphedema with that being said he does have some odor as well which may again just be due to more of the excessive moisture although I think that he may also have something that is a result of bacteria again he has somewhat of a Pseudomonas-like smell to the drainage and fortunately though he is not showing any signs of severe cellulitis he is still experiencing quite a bit of drainage and the discoloration is somewhat of a yellow/green in color which is consistent with Pseudomonas. If he can tolerated I think he may do well with Cipro. 04/24/2019 on evaluation today patient appears to be doing well with regard to his wound in the gluteal region which is healing quite well. That is excellent news. With that being said he is also doing better with regard to his left lower extremity overall I feel like things are definitely showing signs of improvement. 12/28-Patient returns with regards to his gluteal wound that is improving, Left lower leg wounds with significant edema in the lower legs that appear to be persistent. Patient is in compression and has lymphedema pumps at home but apparently he does not use consistently 05/07/2019 Upon inspection patient's wound bed actually showed signs on evaluation today patient's wound currently showed signs of improvement. There is still a lot of maceration but it does look like that there is been some of this maceration occurring as a result of drainage in particular and some as a result of the fact that they have been putting bag balm on the wound area as well as the leg in general. I think this is applying too much moisture and not doing him  any good. I discussed with the patient and his wife today that I think that practice needs to be adjusted I would recommend using a different type of lotion in order to help with moisture around the wound but I would not recommend anything directly on the wound itself. 05/14/2019 on evaluation today patient actually appears to be doing much better in regard to his lower extremity. They have not been using any of the bag balm on the area I think this has made a tremendous improvement overall in his wounds. He has a lot of new skin growth and though there still are a lot of open wounds scattered throughout the circumferential lower leg this overall seems to be doing much better to me. 05/21/2019 upon evaluation today patient appears to be doing well in regard to his lower extremity although he is having unfortunately some blue-green drainage noted at this point. The does have me concerned for a reinitiation of the Pseudomonas infection that we previously treated in December. The last time I gave him a prescription was April 16, 2019. That was a little over a month ago. Nonetheless he does have more green drainage noted today and I think this is indicative of potentially a revving up infection. Obviously we do not want to get to that point. Steven Meza, Steven Meza ([id number]) 05/28/2019 on evaluation today patient appears to be doing better with regard to his lower extremity at this point. Overall I am seeing signs of improvement which is good news. Fortunately there is no evidence of active infection at this time. I do believe that the Cipro has been beneficial for him that was prescribed last week for 14 days he is halfway through that and already appears to be doing much better. This is excellent news. 06/04/2019 upon evaluation today I do  believe that the patient is doing better with regard to his lower extremity. I feel like that he has much less in the way of open wounds and drainage and much in  provement in regard to the overall weeping and even his swelling. There is no signs of erythema no obvious evidence of active infection all of which is great news. No fevers, chills, nausea, vomiting, or diarrhea. Electronic Signature(s) Signed: 06/04/2019 2:11:00 PM By: Worthy Keeler PA-C Entered By: Worthy Keeler on 06/04/2019 14:11:00 Steven Meza ([id number]) -------------------------------------------------------------------------------- Physical Exam Details Patient Name: Steven Meza Date of Service: 06/04/2019 1:45 PM Medical Record Number: [id number] Patient Account Number: [id number] Date of Birth/Sex: 26-Aug-1930 (86 y.o. M) Treating RN: Cornell Barman Primary Care Provider: Fulton Reek Other Clinician: Referring Provider: Fulton Reek Treating Provider/Extender: Melburn Hake, Jalaila Caradonna Weeks in Treatment: 92 Constitutional Well-nourished and well-hydrated in no acute distress. Respiratory normal breathing without difficulty. Psychiatric this patient is able to make decisions and demonstrates good insight into disease process. Alert and Oriented x 3. pleasant and cooperative. Notes Patient's wounds currently showed signs of excellent improvement he has 1 day left of his antibiotic and I think once that is complete he can probably discontinue that at that point. With that being said I do feel like he is making excellent progress and I am extremely pleased with where things stand today. Electronic Signature(s) Signed: 06/04/2019 2:11:26 PM By: Worthy Keeler PA-C Entered By: Worthy Keeler on 06/04/2019 14:11:26 Steven Meza ([id number]) -------------------------------------------------------------------------------- Physician Orders Details Patient Name: Steven Meza Date of Service: 06/04/2019 1:45 PM Medical Record Number: [id number] Patient Account Number: [id number] Date of Birth/Sex: 06-26-1930 (86 y.o. M) Treating RN: Cornell Barman Primary Care Provider:  Fulton Reek Other Clinician: Referring Provider: Fulton Reek Treating Provider/Extender: Melburn Hake, Girard Koontz Weeks in Treatment: 9 Verbal / Phone Orders: No Diagnosis Coding ICD-10 Coding Meza Description I89.0 Lymphedema, not elsewhere classified I87.2 Venous insufficiency (chronic) (peripheral) L97.822 Non-pressure chronic ulcer of other part of left lower leg with fat layer exposed I10 Essential (primary) hypertension I25.10 Atherosclerotic heart disease of native coronary artery without angina pectoris L84 Corns and callosities Wound Cleansing Wound #6 Left,Circumferential Lower Leg o May shower with protection. o No tub bath. Primary Wound Dressing Wound #6 Left,Circumferential Lower Leg o Silver Alginate Secondary Dressing Wound #6 Left,Circumferential Lower Leg o ABD pad Dressing Change Frequency Wound #6 Left,Circumferential Lower Leg o Change Dressing Monday, Wednesday, Friday - Home Health to change dressings on Wednesdays and Fridays Follow-up Appointments Wound #6 Left,Circumferential Lower Leg o Return Appointment in 1 week. o Nurse Visit as needed Edema Control Wound #6 Left,Circumferential Lower Leg o 3 Layer Compression System - Left Lower Extremity Off-Loading Wound #6 Left,Circumferential Lower Leg o Turn and reposition every 2 hours Home Health HAZEM, KISSLER ([id number]) Wound #6 Left,Circumferential Lower Leg o [place] Visits - [facility] o Home Health Nurse may visit PRN to address patientos wound care needs. o FACE TO FACE ENCOUNTER: MEDICARE and MEDICAID PATIENTS: I certify that this patient is under my care and that I had a face-to-face encounter that meets the physician face-to-face encounter requirements with this patient on this date. The encounter with the patient was in whole or in part for the following MEDICAL CONDITION: (primary reason for [place]) MEDICAL NECESSITY: I certify, that based  on my findings, NURSING services are a medically necessary home health service. HOME BOUND STATUS: I certify that my clinical findings support that this patient  is homebound (i.e., Due to illness or injury, pt requires aid of supportive devices such as crutches, cane, wheelchairs, walkers, the use of special transportation or the assistance of another person to leave their place of residence. There is a normal inability to leave the home and doing so requires considerable and taxing effort. Other absences are for medical reasons / religious services and are infrequent or of short duration when for other reasons). o If current dressing causes regression in wound condition, may D/C ordered dressing product/s and apply Normal Saline Moist Dressing daily until next [place] / Other MD appointment. [place] of regression in wound condition at [phone]. o Please direct any NON-WOUND related issues/requests for orders to patient's Primary Care Physician Electronic Signature(s) Signed: 06/04/2019 4:06:16 PM By: Worthy Keeler PA-C Signed: 06/07/2019 5:22:08 PM By: Gretta Cool, BSN, RN, CWS, Kim RN, BSN Entered By: Gretta Cool, BSN, RN, CWS, Kim on 06/04/2019 14:04:52 Steven Meza ([id number]) -------------------------------------------------------------------------------- Problem List Details Patient Name: Steven Meza Date of Service: 06/04/2019 1:45 PM Medical Record Number: [id number] Patient Account Number: [id number] Date of Birth/Sex: 28-Oct-1930 (86 y.o. M) Treating RN: Cornell Barman Primary Care Provider: Fulton Reek Other Clinician: Referring Provider: Fulton Reek Treating Provider/Extender: Melburn Hake, Janaysha Depaulo Weeks in Treatment: 9 Active Problems ICD-10 Evaluated Encounter Meza Description Active Date Today Diagnosis I89.0 Lymphedema, not elsewhere classified 04/02/2019 No Yes I87.2 Venous insufficiency (chronic) (peripheral) 04/02/2019 No Yes L97.822  Non-pressure chronic ulcer of other part of left lower leg with 04/02/2019 No Yes fat layer exposed I10 Essential (primary) hypertension 04/02/2019 No Yes I25.10 Atherosclerotic heart disease of native coronary artery 04/02/2019 No Yes without angina pectoris L84 Corns and callosities 05/09/2019 No Yes Inactive Problems Resolved Problems Electronic Signature(s) Signed: 06/04/2019 1:39:56 PM By: Worthy Keeler PA-C Entered By: Worthy Keeler on 06/04/2019 13:39:55 Steven Meza ([id number]) -------------------------------------------------------------------------------- Progress Note Details Patient Name: Steven Meza Date of Service: 06/04/2019 1:45 PM Medical Record Number: [id number] Patient Account Number: [id number] Date of Birth/Sex: 13-Aug-1930 (86 y.o. M) Treating RN: Cornell Barman Primary Care Provider: Fulton Reek Other Clinician: Referring Provider: Fulton Reek Treating Provider/Extender: Melburn Hake, Setsuko Robins Weeks in Treatment: 9 Subjective Chief Complaint Information obtained from Patient Left LE Ulcer History of Present Illness (HPI) ADMISSION 05/17/2018 Mr. Camilli is an 86 year old man who is been treated at the lymphedema clinic for a long period with lymphedema wraps for bilateral lower extremity lymphedema. About 6 months ago they noted small open areas on his left lateral calf just above the ankle. These were open. They are apparently putting some form of ointment on this. They have been referred here for our review of this. The patient has a long history of lymphedema and venous stasis. It says in his records that he has recurrent cellulitis although his wife denies this but she states that the lymphedema may have started with cellulitis several years ago. Also in his record there is a history of bladder cancer which they seem to know very little about however he did not have any radiation to the pelvis or anything that could have contributed to lymphedema that  they are aware of. There is no prior wound history. The patient has 2 small punched out areas on the left lateral calf that have adherent debris on the surface. Past medical history; lymphedema, hypertension, cellulitis, hearing loss, morbid obesity, osteoarthritis, venous stasis, bladder CA, diverticulosis. ABIs in our clinic were 0.36 on the right and 0.57 on the left 05/24/2018; corrected  age on this patient is 66 versus what I said last week. He has been for his arterial studies which predictably are not very good. On the right his ABI is 0.65. Monophasic waveforms noted at the right ankle including the posterior tibial and dorsalis pedis. He has triphasic waveforms of the common femoral and profunda femoris triphasic proximal SFA with monophasic distal FSA and popliteal artery. Monophasic tibial waveforms. On the left his ABI is 0.58 monophasic waveforms at the left ankle. Duplex of the left lower extremity demonstrated atherosclerotic change with monophasic waveforms throughout the left lower extremity. There was some concern about left iliac disease and potentially femoral-popliteal and tibial disease. The patient does not describe claudication although according to his wife he over emphasizes his activity. Patient states he is limited by pain in both knees His wounds are on the left lateral calf. 2 small areas with necrotic debris. We have been using silver collagen and light Ace wraps 05/31/2018; wounds on the left lateral calf in the setting of very significant lymphedema and probably PAD. We have been using silver collagen. The base of the small wound looks reasonably improved. I sent him to see Dr. Fletcher Anon however I see now he has an appointment with Dr. Alvester Chou on February 12 2/5; left lateral calf wound looks the same. His wife is doing a good job with her lymphedema wraps and maintaining that the edema. He most likely has PAD and is due to see Dr. Gwenlyn Found of infectious disease on February  12 2/19; left lateral calf wounds look about the same. This looks like wound secondary to chronic venous insufficiency with lymphedema however the patient has very poor arterial studies. We referred him to Dr. Gwenlyn Found. Dr. Gwenlyn Found did not feel he needed to do anything from an arterial point of view. He did not address the question about the aggressiveness of compression. After some thoughts about this I elected to go ahead and put him in 3 layer compression which after all would be less compression then the lymphedema clinic was putting on this. I am hopeful that this should be enough to get some closure of the small wounds 2/26; left lateral calf wound letter this week. He tolerated the 3 layer compression well furthermore he is sleeping in a hospital Steven Meza, Steven A. ([id number]) bed which helps keep his legs up at night. The wound looks better measuring smaller especially in width 3/3; left lateral calf wound about the same size. The 3 layer compression has really helped get the edema down in his leg however. Using silver collagen 3/11; left lateral calf wound better looking wound surface but about the same size. We have been using 3 layer compression which is really helped get the edema down in the left leg. Even after Dr. Kennon Holter assessment I am reluctant to go to 4 layer compression. He is tolerating 3 layer compression well. We have been using silver collagen 3/18-Patient returns for his left lateral calf wound which overall is looking better, slightly increased in size and dimensions and area, he is tolerating the 3 layer compression, has been dressed with silver collagen which we will continue 3/25 left lateral calf wound much better looking. Size is smaller. He has been using silver collagen 4/1; gradually getting smaller in size. Using silver collagen with 3 layer compression. I think the patient has some degree of PAD. He saw Dr. Gwenlyn Found in consultation, he did not specifically comment on  whether he could tolerate 4 layer compression 4/8; the wound  is slightly smaller in depth. We are using 3 layer compression with silver collagen. I had him seen for arterial insufficiency however his ABI was 0.57 in the left leg. Dr. Gwenlyn Found really did not seem to middle on the degree of compression he could tolerate 4/15; the wound is slightly smaller and slightly less deep. We have been using 3 layer compression with silver collagen. There is not an option for 4-layer compression here. 4/22; the patient is making decent progress on his wound on the left lateral calf however he arrives in with a superficial wound on the right anterior tibial area. Were not really sure how this happened. He had been using a stocking on the right leg. He has been using silver collagen on the wounds Amazing to me he is he appears to have compression pumps at home. I am not really sure I knew this before. In any case he has not been using them 4/29; left lateral calf wound continues to get gradually smaller. This looks like it is on its way to closure. The area on the right anterior tibial area is about the same in terms of size superficial without any depth. This was probably wrap injury He tells me that he has been using his compression pumps once a day for 1 hour without any pain 5/6; not as good today. Left lateral calf wound is actually larger and he does not have quite as good edema control. The area on the right anterior tibia is about the same still superficial. He states he is using his compression pumps once a day. We are using 3 layer compression on the left and Curlex Coban on the right 5/13; the area on the right is very close to being healed. The area on the left still has some depth but has a healthy wound surface. He has excellent edema control bilaterally. Using Hydrofera Blue 5/20-Patient returns in 1 week after being in 3 layer compression on the left, the left lateral calf ulcer is stable with  some slough, the right anterior tibial ulcer is healed we have been using Kerlix Coban on that leg 5/27; Hydrofera Blue and 3 layer compression. Still some slough in the wound area and some nonviable edges around the wound. 6/3; this patient has lymphedema and chronic venous insufficiency. He had difficult to close wound on his left lateral calf. This is closed today. 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 Finally he has known PAD but he is managed to tolerate the compression we reporting on him and also using his compression pumps that he already had at home twice a day 6/10; we discharge this patient last week having finally close the small area in the left lateral calf. Apparently by Friday of last week this had opened and was draining. He is therefore back in clinic. His wife who is present also was concerned about an area on the right anterior tibia."o 6/17; the patient does not have an open area on the right leg. On the left he has a small circular area anteriorly and the same area laterally that he had last time. However both of these appear to be improved he has his Farrow wrap 30/40. It turns out that he is only using his external compression pumps once a day. I tried to get him up to twice a day today 6/24; the patient comes in with his external compression garment on the right leg/Farrow wrap. As far as we know no  open wounds here. He has 2 areas that are smaller one on the left anterior pretibial and one on the left lateral calf which is the site of his initial wounds. I have successfully caught him into his external compression pumps twice a day 7/1; still using his external compression garment on the right leg. As far as we know there are no open wounds here. The area on the left lateral calf which was the site of his initial wound has closed over. The area on the left anterior pretibial that we identified last week is still open. 7/8; as  far as we know he has no open wounds on the right leg. He has his external compression garment on here. The original wound on the left lateral calf has closed over. He developed an area on the left anterior pretibial 2 weeks ago and seems to have 2 smaller areas on the proximal and distal lateral calf. We do not have his good edema control this week as I am used to seeing 7/15; the patient has still a linear area on the left anterior tibia. The area on the left lateral leg looked somewhat inflamed today but no cellulitis. He did have what looked to be a small pustule that I cultured for CandS although I did not give him empiric antibiotics. 7/22; small area on the left anterior tibia. The area on the left lateral leg still looks inflamed. The pustule that I cultured last Steven Meza, Steven Meza. ([id number]) week grew Enterobacter and Pseudomonas. I prescribed Cipro they still have not picked that up. In any case the area actually looks fairly satisfactory. He has decent edema control using his compression pumps twice a day 7/29; the area on the left lateral leg looks a lot better than last week. Nothing is open here. He has an area just medial to the mid anterior tibia that is still open that is only open wound today. 8/5-Patient returns at 1 week for the left leg ulcer, which is actually looking better, he is also complaining of left plantar foot pain below the great toe. He does have a hematoma here 8/12-Patient returns at 1 week, the left leg open area looks about the same if not slightly better, the left plantar hematoma under the great toe is about the same, patient apparently has not been resting his foot on anything for too long period of time and does not wear the [employer]kenstock shoes unless he walks outdoors which he does not do often 8/19; small wound on the left anterior lower leg this is just about closed there is nothing on the left lateral leg. He is using his compression pumps He arrives in  clinic today with a black eschar over the right first plantar metatarsal head. This was apparently a bruise that was identified by her nurses last week. He wears crocs on his feet. He also may rub these on a footboard at home 8/26; there is nothing open on the left lateral lower leg he has a small area just medial to the tibia. He is going to see his podiatrist Dr. Milinda Pointer about the area on the left plantar first metatarsal head. He is using his external compression pumps. He has a juxta lite on the right leg 9/2; there is nothing open on either side of her left lower leg. He went to see podiatry about the area on the left first metatarsal head. This was apparently debrided. He was given mupirocin and oral antibiotics. I do not believe he  is offloading this at all. Readmission: 04/02/2019 upon evaluation today patient actually appears for reevaluation here in our clinic secondary to issues that he is having with his left lower extremity. He has been tolerating the dressing changes without complication. Fortunately there is no signs of active infection at this time. No fevers, chills, nausea, vomiting, or diarrhea. He has been in the hospital since we last saw him and then subsequently transferred back to [facility]. He tells me that they have been changing his dressing once a day and that he has had a significant amount of pain secondary to undergoing these dressing changes. Fortunately there is no evidence of active infection at this point which is good news. No fevers, chills, nausea, vomiting, or diarrhea. With that being said he wonders if we can go back to doing what we were doing before which was when he actually had the dressing changes performed once a week here in the clinic he tells me that he did very well with that. At that point he tolerated a 3 layer compression wrap which was approved by vascular, Dr. Tyrell Antonio office, to be used previous although 4-layer compression wrap is probably  too much for him. 04/16/2019 on evaluation today patient appears to be doing decently well with regard to his lower extremity although he has had a lot of drainage from his leg. A lot of this I think is just secondary to the lymphedema with that being said he does have some odor as well which may again just be due to more of the excessive moisture although I think that he may also have something that is a result of bacteria again he has somewhat of a Pseudomonas-like smell to the drainage and fortunately though he is not showing any signs of severe cellulitis he is still experiencing quite a bit of drainage and the discoloration is somewhat of a yellow/green in color which is consistent with Pseudomonas. If he can tolerated I think he may do well with Cipro. 04/24/2019 on evaluation today patient appears to be doing well with regard to his wound in the gluteal region which is healing quite well. That is excellent news. With that being said he is also doing better with regard to his left lower extremity overall I feel like things are definitely showing signs of improvement. 12/28-Patient returns with regards to his gluteal wound that is improving, Left lower leg wounds with significant edema in the lower legs that appear to be persistent. Patient is in compression and has lymphedema pumps at home but apparently he does not use consistently 05/07/2019 Upon inspection patient's wound bed actually showed signs on evaluation today patient's wound currently showed signs of improvement. There is still a lot of maceration but it does look like that there is been some of this maceration occurring as a result of drainage in particular and some as a result of the fact that they have been putting bag balm on the wound area as well as the leg in general. I think this is applying too much moisture and not doing him any good. I discussed with the patient and his wife today that I think that practice needs to be  adjusted I would recommend using a different type of lotion in order to help with moisture around the wound but I would not recommend anything directly on the wound itself. 05/14/2019 on evaluation today patient actually appears to be doing much better in regard to his lower extremity. They have not been using  any of the bag balm on the area I think this has made a tremendous improvement overall in his wounds. He has a lot of new skin growth and though there still are a lot of open wounds scattered throughout the circumferential lower leg this overall seems to be doing much better to me. Steven Meza, Steven Meza ([id number]) 05/21/2019 upon evaluation today patient appears to be doing well in regard to his lower extremity although he is having unfortunately some blue-green drainage noted at this point. The does have me concerned for a reinitiation of the Pseudomonas infection that we previously treated in December. The last time I gave him a prescription was April 16, 2019. That was a little over a month ago. Nonetheless he does have more green drainage noted today and I think this is indicative of potentially a revving up infection. Obviously we do not want to get to that point. 05/28/2019 on evaluation today patient appears to be doing better with regard to his lower extremity at this point. Overall I am seeing signs of improvement which is good news. Fortunately there is no evidence of active infection at this time. I do believe that the Cipro has been beneficial for him that was prescribed last week for 14 days he is halfway through that and already appears to be doing much better. This is excellent news. 06/04/2019 upon evaluation today I do believe that the patient is doing better with regard to his lower extremity. I feel like that he has much less in the way of open wounds and drainage and much in provement in regard to the overall weeping and even his swelling. There is no signs of erythema no  obvious evidence of active infection all of which is great news. No fevers, chills, nausea, vomiting, or diarrhea. Objective Constitutional Well-nourished and well-hydrated in no acute distress. Vitals Time Taken: 1:35 PM, Height: 65 in, Weight: 273 lbs, BMI: 45.4, Temperature: 98.0 F, Pulse: 72 bpm, Respiratory Rate: 18 breaths/min, Blood Pressure: 138/39 mmHg. Respiratory normal breathing without difficulty. Psychiatric this patient is able to make decisions and demonstrates good insight into disease process. Alert and Oriented x 3. pleasant and cooperative. General Notes: Patient's wounds currently showed signs of excellent improvement he has 1 day left of his antibiotic and I think once that is complete he can probably discontinue that at that point. With that being said I do feel like he is making excellent progress and I am extremely pleased with where things stand today. Integumentary (Hair, Skin) Wound #6 status is Open. Original cause of wound was Gradually Appeared. The wound is located on the Left,Circumferential Lower Leg. The wound measures 7cm length x 27cm width x 0.1cm depth; 148.44cm^2 area and 14.844cm^3 volume. There is Fat Layer (Subcutaneous Tissue) Exposed exposed. There is no tunneling or undermining noted. There is a medium amount of serous drainage noted. The wound margin is indistinct and nonvisible. There is large (67-100%) pink granulation within the wound bed. There is a small (1-33%) amount of necrotic tissue within the wound bed including Adherent Slough. Steven Meza, Steven Meza ([id number]) Assessment Active Problems ICD-10 Lymphedema, not elsewhere classified Venous insufficiency (chronic) (peripheral) Non-pressure chronic ulcer of other part of left lower leg with fat layer exposed Essential (primary) hypertension Atherosclerotic heart disease of native coronary artery without angina pectoris Corns and callosities Plan Wound Cleansing: Wound #6  Left,Circumferential Lower Leg: May shower with protection. No tub bath. Primary Wound Dressing: Wound #6 Left,Circumferential Lower Leg: Silver Alginate Secondary Dressing: Wound #6  Left,Circumferential Lower Leg: ABD pad Dressing Change Frequency: Wound #6 Left,Circumferential Lower Leg: Change Dressing Monday, Wednesday, Friday - Home Health to change dressings on Wednesdays and Fridays Follow-up Appointments: Wound #6 Left,Circumferential Lower Leg: Return Appointment in 1 week. Nurse Visit as needed Edema Control: Wound #6 Left,Circumferential Lower Leg: 3 Layer Compression System - Left Lower Extremity Off-Loading: Wound #6 Left,Circumferential Lower Leg: Turn and reposition every 2 hours Home Health: Wound #6 Left,Circumferential Lower Leg: [place] Nurse may visit PRN to address patient s wound care needs. FACE TO FACE ENCOUNTER: MEDICARE and MEDICAID PATIENTS: I certify that this patient is under my care and that I had a face-to-face encounter that meets the physician face-to-face encounter requirements with this patient on this date. The encounter with the patient was in whole or in part for the following MEDICAL CONDITION: (primary reason for [place]) MEDICAL NECESSITY: I certify, that based on my findings, NURSING services are a medically necessary home health service. HOME BOUND STATUS: I certify that my clinical findings support that this patient is homebound (i.e., Due to illness or injury, pt requires aid of supportive devices such as crutches, cane, wheelchairs, walkers, the use of special transportation or the assistance of another person to leave their place of residence. There is a normal inability to leave the home and doing so requires considerable and taxing effort. Other absences are for medical reasons / religious services and are infrequent or of short duration when for other reasons). If current dressing  causes regression in wound condition, may D/C ordered dressing product/s and apply Normal Saline Moist Dressing daily until next [place] / Other MD appointment. [place] of regression in [place], [place] ([id number]) wound condition at [phone]. Please direct any NON-WOUND related issues/requests for orders to patient's Primary Care Physician 1. My suggestion is going to be that we continue with the current wound care measures which includes silver alginate dressing followed by the 3 layer compression wrap I do feel like this is doing well for him. 2. I am in a suggest as well he wrap up with the antibiotic therapy currently this will be a total of 14 days completed as of tomorrow and then subsequently from there we will see if he needs to have anything additional going forward. We will see patient back for reevaluation in 1 week here in the clinic. If anything worsens or changes patient will contact our office for additional recommendations. Electronic Signature(s) Signed: 06/04/2019 2:12:54 PM By: Worthy Keeler PA-C Entered By: Worthy Keeler on 06/04/2019 14:12:54 Steven Meza ([id number]) -------------------------------------------------------------------------------- SuperBill Details Patient Name: Steven Meza Date of Service: 06/04/2019 Medical Record Number: [id number] Patient Account Number: [id number] Date of Birth/Sex: 05-Mar-1931 (86 y.o. M) Treating RN: Cornell Barman Primary Care Provider: Fulton Reek Other Clinician: Referring Provider: Fulton Reek Treating Provider/Extender: Melburn Hake, Deaire Mcwhirter Weeks in Treatment: 9 Diagnosis Coding ICD-10 Codes Meza Description I89.0 Lymphedema, not elsewhere classified I87.2 Venous insufficiency (chronic) (peripheral) L97.822 Non-pressure chronic ulcer of other part of left lower leg with fat layer exposed I10 Essential (primary) hypertension I25.10 Atherosclerotic heart disease of native  coronary artery without angina pectoris L84 Corns and callosities Facility Procedures CPT4 Meza: [id number] Description: (Facility Use Only) [id number] - APPLY MULTLAY COMPRS LWR LT LEG Modifier: Quantity: 1 Physician Procedures CPT4 Meza Description: [id number] - WC PHYS LEVEL 3 - EST PT ICD-10 Diagnosis Description I89.0 Lymphedema, not elsewhere classified I87.2 Venous insufficiency (chronic) (  peripheral) [id number] Non-pressure chronic ulcer of other part of left lower leg  wit I10 Essential (primary) hypertension Modifier: h fat layer expos Quantity: 1 ed Electronic Signature(s) Signed: 06/04/2019 2:13:07 PM By: Worthy Keeler PA-C Entered By: Worthy Keeler on 06/04/2019 14:13:06

## 2019-06-07 NOTE — Progress Notes (Signed)
HYUN, FRIESE ([id number]) Visit Report for 06/04/2019 Arrival Information Details Patient Name: Steven Meza, Steven Meza Date of Service: 06/04/2019 1:45 PM Medical Record Number: [id number] Patient Account Number: [id number] Date of Birth/Sex: 10/25/1930 (86 y.o. M) Treating RN: Cornell Barman Primary Care Ivet Guerrieri: Fulton Reek Other Clinician: Referring Deasha Clendenin: Fulton Reek Treating Yariana Hoaglund/Extender: Melburn Hake, HOYT Weeks in Treatment: 9 Visit Information History Since Last Visit Added or deleted any medications: No Patient Arrived: Walker Any new allergies or adverse reactions: No Arrival Time: 13:36 Had a fall or experienced change in No Accompanied By: wife activities of daily living that may affect Transfer Assistance: None risk of falls: Patient Identification Verified: Yes Signs or symptoms of abuse/neglect since last visito No Secondary Verification Process Completed: Yes Hospitalized since last visit: No Implantable device outside of the clinic excluding No cellular tissue based products placed in the center since last visit: Has Dressing in Place as Prescribed: Yes Has Compression in Place as Prescribed: Yes Pain Present Now: No Electronic Signature(s) Signed: 06/04/2019 3:33:21 PM By: Lorine Bears RCP, RRT, CHT Entered By: Lorine Bears on 06/04/2019 13:37:41 [name] ([id number]) -------------------------------------------------------------------------------- Encounter Discharge Information Details Patient Name: [name] Date of Service: 06/04/2019 1:45 PM Medical Record Number: [id number] Patient Account Number: [id number] Date of Birth/Sex: 1930/11/11 (86 y.o. M) Treating RN: Cornell Barman Primary Care Ell Tiso: Fulton Reek Other Clinician: Referring Magdala Brahmbhatt: Fulton Reek Treating Djon Tith/Extender: Melburn Hake, HOYT Weeks in Treatment: 9 Encounter Discharge Information Items Discharge Condition: Stable Ambulatory  Status: Ambulatory Discharge Destination: Home Transportation: Other Accompanied By: spouse Schedule Follow-up Appointment: Yes Clinical Summary of Care: Electronic Signature(s) Signed: 06/07/2019 5:22:08 PM By: Gretta Cool, BSN, RN, CWS, Kim RN, BSN Entered By: Gretta Cool, BSN, RN, CWS, Kim on 06/04/2019 14:06:14 [name] ([id number]) -------------------------------------------------------------------------------- Lower Extremity Assessment Details Patient Name: [name] Date of Service: 06/04/2019 1:45 PM Medical Record Number: [id number] Patient Account Number: [id number] Date of Birth/Sex: [birth year] (86 y.o. M) Treating RN: Montey Hora Primary Care Zarek Relph: Fulton Reek Other Clinician: Referring Yanet Balliet: Fulton Reek Treating Terilyn Sano/Extender: Melburn Hake, HOYT Weeks in Treatment: 9 Edema Assessment Assessed: [Left: No] [Right: No] Edema: [Left: Yes] [Right: No] Calf Left: Right: Point of Measurement: 33 cm From Medial Instep 44.5 cm cm Ankle Left: Right: Point of Measurement: 12 cm From Medial Instep 28.5 cm cm Vascular Assessment Pulses: Dorsalis Pedis Palpable: [Left:Yes] Electronic Signature(s) Signed: 06/04/2019 4:01:28 PM By: Montey Hora Entered By: Montey Hora on 06/04/2019 13:50:41 [name] ([id number]) -------------------------------------------------------------------------------- Multi Wound Chart Details Patient Name: [name] Date of Service: 06/04/2019 1:45 PM Medical Record Number: [id number] Patient Account Number: [id number] Date of Birth/Sex: 1930/11/22 (86 y.o. M) Treating RN: Cornell Barman Primary Care Katiana Ruland: Fulton Reek Other Clinician: Referring Debrah Granderson: Fulton Reek Treating Asa Fath/Extender: Melburn Hake, HOYT Weeks in Treatment: 9 Vital Signs Height(in): 65 Pulse(bpm): 72 Weight(lbs): [id number] Blood Pressure(mmHg): 138/39 Body Mass Index(BMI): 45 Temperature(F): 98.0 Respiratory  Rate 18 (breaths/min): Photos: [N/A:N/A] Wound Location: Left Lower Leg - N/A N/A Circumferential Wounding Event: Gradually Appeared N/A N/A Primary Etiology: Venous Leg Ulcer N/A N/A Comorbid History: Lymphedema, Coronary Artery N/A N/A Disease, Hypertension Date Acquired: 03/02/2019 N/A N/A Weeks of Treatment: 9 N/A N/A Wound Status: Open N/A N/A Measurements L x W x D [id number] N/A N/A (cm) Area (cm) : 148.44 N/A N/A Volume (cm) : 14.844 N/A N/A % Reduction in Area: 46.20% N/A N/A % Reduction in Volume: 46.20% N/A N/A Classification: Partial Thickness N/A N/A Exudate Amount: Medium N/A N/A Exudate  Type: Serous N/A N/A Exudate Color: amber N/A N/A Wound Margin: Indistinct, nonvisible N/A N/A JIBRIL, ERHARDT ([id number]) Granulation Amount: Large (67-100%) N/A N/A Granulation Quality: Pink N/A N/A Necrotic Amount: Small (1-33%) N/A N/A Exposed Structures: Fat Layer (Subcutaneous N/A N/A Tissue) Exposed: Yes Fascia: No Tendon: No Muscle: No Joint: No Bone: No Epithelialization: Small (1-33%) N/A N/A Treatment Notes Electronic Signature(s) Signed: 06/07/2019 5:22:08 PM By: Gretta Cool, BSN, RN, CWS, Kim RN, BSN Entered By: Gretta Cool, BSN, RN, CWS, Kim on 06/04/2019 14:03:53 [name] ([id number]) -------------------------------------------------------------------------------- Multi-Disciplinary Care Plan Details Patient Name: [name] Date of Service: 06/04/2019 1:45 PM Medical Record Number: [id number] Patient Account Number: [id number] Date of Birth/Sex: Jan 09, 1931 (86 y.o. M) Treating RN: Cornell Barman Primary Care Deolinda Frid: Fulton Reek Other Clinician: Referring Bladimir Auman: Fulton Reek Treating Chitara Clonch/Extender: Melburn Hake, HOYT Weeks in Treatment: 9 Active Inactive Abuse / Safety / Falls / Self Care Management Nursing Diagnoses: Potential for falls Goals: Patient will remain injury free related to falls Date Initiated: 04/24/2019 Target Resolution  Date: 07/14/2019 Goal Status: Active Interventions: Assess fall risk on admission and as needed Notes: Venous Leg Ulcer Nursing Diagnoses: Actual venous Insuffiency (use after diagnosis is confirmed) Goals: Patient will maintain optimal edema control Date Initiated: 04/24/2019 Target Resolution Date: 07/14/2019 Goal Status: Active Interventions: Compression as ordered Notes: Electronic Signature(s) Signed: 06/07/2019 5:22:08 PM By: Gretta Cool, BSN, RN, CWS, Kim RN, BSN Entered By: Gretta Cool, BSN, RN, CWS, Kim on 06/04/2019 14:03:39 [name] ([id number]) -------------------------------------------------------------------------------- Pain Assessment Details Patient Name: [name] Date of Service: 06/04/2019 1:45 PM Medical Record Number: [id number] Patient Account Number: [id number] Date of Birth/Sex: 09-09-1930 (86 y.o. M) Treating RN: Cornell Barman Primary Care Tavoris Brisk: Fulton Reek Other Clinician: Referring Britt Petroni: Fulton Reek Treating Timira Bieda/Extender: Melburn Hake, HOYT Weeks in Treatment: 9 Active Problems Location of Pain Severity and Description of Pain Patient Has Paino No Site Locations Pain Management and Medication Current Pain Management: Electronic Signature(s) Signed: 06/04/2019 3:33:21 PM By: Lorine Bears RCP, RRT, CHT Signed: 06/07/2019 5:22:08 PM By: Gretta Cool, BSN, RN, CWS, Kim RN, BSN Entered By: Lorine Bears on 06/04/2019 13:37:50 [name] ([id number]) -------------------------------------------------------------------------------- Patient/Caregiver Education Details Patient Name: [name] Date of Service: 06/04/2019 1:45 PM Medical Record Number: [id number] Patient Account Number: [id number] Date of Birth/Gender: 06/26/1930 (86 y.o. M) Treating RN: Cornell Barman Primary Care Physician: Fulton Reek Other Clinician: Referring Physician: Fulton Reek Treating Physician/Extender: Sharalyn Ink in  Treatment: 9 Education Assessment Education Provided To: Patient Education Topics Provided Venous: Handouts: Controlling Swelling with Multilayered Compression Wraps Methods: Demonstration, Explain/Verbal Responses: See progress note Wound/Skin Impairment: Handouts: Caring for Your Ulcer Methods: Demonstration, Explain/Verbal Responses: State content correctly Electronic Signature(s) Signed: 06/07/2019 5:22:08 PM By: Gretta Cool, BSN, RN, CWS, Kim RN, BSN Entered By: Gretta Cool, BSN, RN, CWS, Kim on 06/04/2019 14:05:39 [name] ([id number]) -------------------------------------------------------------------------------- Wound Assessment Details Patient Name: [name] Date of Service: 06/04/2019 1:45 PM Medical Record Number: [id number] Patient Account Number: [id number] Date of Birth/Sex: [birth year] (86 y.o. M) Treating RN: Montey Hora Primary Care Khalel Alms: Fulton Reek Other Clinician: Referring Taha Dimond: Fulton Reek Treating Cope Marte/Extender: Melburn Hake, HOYT Weeks in Treatment: 9 Wound Status Wound Number: 6 Primary Venous Leg Ulcer Etiology: Wound Location: Left Lower Leg - Circumferential Wound Status: Open Wounding Event: Gradually Appeared Comorbid Lymphedema, Coronary Artery Disease, Date Acquired: 03/02/2019 History: Hypertension Weeks Of Treatment: 9 Clustered Wound: No Photos Wound Measurements Length: (cm) 7 Width: (cm) 27 Depth: (cm) 0.1 Area: (cm) 148.44  Volume: (cm) 14.844 % Reduction in Area: 46.2% % Reduction in Volume: 46.2% Epithelialization: Small (1-33%) Tunneling: No Undermining: No Wound Description Classification: Partial Thickness Foul Odor Wound Margin: Indistinct, nonvisible Slough/Fi Exudate Amount: Medium Exudate Type: Serous Exudate Color: amber After Cleansing: No brino Yes Wound Bed Granulation Amount: Large (67-100%) Exposed Structure Granulation Quality: Pink Fascia Exposed: No Necrotic Amount: Small  (1-33%) Fat Layer (Subcutaneous Tissue) Exposed: Yes Necrotic Quality: Adherent Slough Tendon Exposed: No Muscle Exposed: No Joint Exposed: No Bone Exposed: No Treatment Notes SKYLAND, FEIST ([id number]) Wound #6 (Left, Circumferential Lower Leg) Notes Scell, abd, 3 Layer left leg Electronic Signature(s) Signed: 06/04/2019 4:01:28 PM By: Montey Hora Entered By: Montey Hora on 06/04/2019 13:54:14 [name] ([id number]) -------------------------------------------------------------------------------- Vitals Details Patient Name: [name] Date of Service: 06/04/2019 1:45 PM Medical Record Number: [id number] Patient Account Number: [id number] Date of Birth/Sex: Sep 21, 1930 (86 y.o. M) Treating RN: Cornell Barman Primary Care Rhonna Holster: Fulton Reek Other Clinician: Referring Cortne Amara: Fulton Reek Treating Makeba Delcastillo/Extender: Melburn Hake, HOYT Weeks in Treatment: 9 Vital Signs Time Taken: 13:35 Temperature (F): 98.0 Height (in): 65 Pulse (bpm): 72 Weight (lbs): 273 Respiratory Rate (breaths/min): 18 Body Mass Index (BMI): 45.4 Blood Pressure (mmHg): 138/39 Reference Range: 80 - 120 mg / dl Electronic Signature(s) Signed: 06/04/2019 3:33:21 PM By: Lorine Bears RCP, RRT, CHT Entered By: Lorine Bears on 06/04/2019 13:42:41

## 2019-06-11 ENCOUNTER — Encounter: Payer: Medicare Other | Admitting: Physician Assistant

## 2019-06-11 ENCOUNTER — Other Ambulatory Visit: Payer: Self-pay

## 2019-06-11 DIAGNOSIS — L97822 Non-pressure chronic ulcer of other part of left lower leg with fat layer exposed: Secondary | ICD-10-CM | POA: Diagnosis not present

## 2019-06-11 NOTE — Progress Notes (Addendum)
GIDDEON, BESSINGER ([id number]) Visit Report for 06/11/2019 Chief Complaint Document Details Patient Name: Steven Meza, Steven Meza Date of Service: 06/11/2019 1:45 PM Medical Record Number: [id number] Patient Account Number: [id number] Date of Birth/Sex: 07/17/1930 (86 y.o. Male) Treating RN: Primary Care Provider: Fulton Reek Other Clinician: Referring Provider: Fulton Reek Treating Provider/Extender: Melburn Hake, Marcus Schwandt Weeks in Treatment: 10 Information Obtained from: Patient Chief Complaint Left LE Ulcer Electronic Signature(s) Signed: 06/11/2019 2:12:22 PM By: Worthy Keeler PA-C Entered By: Worthy Keeler on 06/11/2019 14:12:21 Steven Meza ([id number]) -------------------------------------------------------------------------------- HPI Details Patient Name: Steven Meza Date of Service: 06/11/2019 1:45 PM Medical Record Number: [id number] Patient Account Number: [id number] Date of Birth/Sex: Oct 17, 1930 (86 y.o. Male) Treating RN: Primary Care Provider: Fulton Reek Other Clinician: Referring Provider: Fulton Reek Treating Provider/Extender: Melburn Hake, Skyann Ganim Weeks in Treatment: 10 History of Present Illness HPI Description: ADMISSION 05/17/2018 Mr. [name] is an 86 year old man who is been treated at the lymphedema clinic for a long period with lymphedema wraps for bilateral lower extremity lymphedema. About 6 months ago they noted small open areas on his left lateral calf just above the ankle. These were open. They are apparently putting some form of ointment on this. They have been referred here for our review of this. The patient has a long history of lymphedema and venous stasis. It says in his records that he has recurrent cellulitis although his wife denies this but she states that the lymphedema may have started with cellulitis several years ago. Also in his record there is a history of bladder cancer which they seem to know very little about however he did not have  any radiation to the pelvis or anything that could have contributed to lymphedema that they are aware of. There is no prior wound history. The patient has 2 small punched out areas on the left lateral calf that have adherent debris on the surface. Past medical history; lymphedema, hypertension, cellulitis, hearing loss, morbid obesity, osteoarthritis, venous stasis, bladder CA, diverticulosis. ABIs in our clinic were 0.36 on the right and 0.57 on the left 05/24/2018; corrected age on this patient is 11 versus what I said last week. He has been for his arterial studies which predictably are not very good. On the right his ABI is 0.65. Monophasic waveforms noted at the right ankle including the posterior tibial and dorsalis pedis. He has triphasic waveforms of the common femoral and profunda femoris triphasic proximal SFA with monophasic distal FSA and popliteal artery. Monophasic tibial waveforms. On the left his ABI is 0.58 monophasic waveforms at the left ankle. Duplex of the left lower extremity demonstrated atherosclerotic change with monophasic waveforms throughout the left lower extremity. There was some concern about left iliac disease and potentially femoral-popliteal and tibial disease. The patient does not describe claudication although according to his wife he over emphasizes his activity. Patient states he is limited by pain in both knees His wounds are on the left lateral calf. 2 small areas with necrotic debris. We have been using silver collagen and light Ace wraps 05/31/2018; wounds on the left lateral calf in the setting of very significant lymphedema and probably PAD. We have been using silver collagen. The base of the small wound looks reasonably improved. I sent him to see Dr. Fletcher Anon however I see now he has an appointment with Dr. Alvester Chou on February 12 2/5; left lateral calf wound looks the same. His wife is doing a good job with her lymphedema wraps and maintaining that  the  edema. He most likely has PAD and is due to see Dr. Gwenlyn Found of infectious disease on February 12 2/19; left lateral calf wounds look about the same. This looks like wound secondary to chronic venous insufficiency with lymphedema however the patient has very poor arterial studies. We referred him to Dr. Gwenlyn Found. Dr. Gwenlyn Found did not feel he needed to do anything from an arterial point of view. He did not address the question about the aggressiveness of compression. After some thoughts about this I elected to go ahead and put him in 3 layer compression which after all would be less compression then the lymphedema clinic was putting on this. I am hopeful that this should be enough to get some closure of the small wounds 2/26; left lateral calf wound letter this week. He tolerated the 3 layer compression well furthermore he is sleeping in a hospital bed which helps keep his legs up at night. The wound looks better measuring smaller especially in width 3/3; left lateral calf wound about the same size. The 3 layer compression has really helped get the edema down in his leg however. Using silver collagen 3/11; left lateral calf wound better looking wound surface but about the same size. We have been using 3 layer compression which is really helped get the edema down in the left leg. Even after Dr. Kennon Holter assessment I am reluctant to go to 4 layer compression. He is tolerating 3 layer compression well. We have been using silver collagen [name], [name] ([id number]) 3/18-Patient returns for his left lateral calf wound which overall is looking better, slightly increased in size and dimensions and area, he is tolerating the 3 layer compression, has been dressed with silver collagen which we will continue 3/25oleft lateral calf wound much better looking. Size is smaller. He has been using silver collagen 4/1; gradually getting smaller in size. Using silver collagen with 3 layer compression. I think the patient  has some degree of PAD. He saw Dr. Gwenlyn Found in consultation, he did not specifically comment on whether he could tolerate 4 layer compression 4/8; the wound is slightly smaller in depth. We are using 3 layer compression with silver collagen. I had him seen for arterial insufficiency however his ABI was 0.57 in the left leg. Dr. Gwenlyn Found really did not seem to middle on the degree of compression he could tolerate 4/15; the wound is slightly smaller and slightly less deep. We have been using 3 layer compression with silver collagen. There is not an option for 4-layer compression here. 4/22; the patient is making decent progress on his wound on the left lateral calf however he arrives in with a superficial wound on the right anterior tibial area. Were not really sure how this happened. He had been using a stocking on the right leg. He has been using silver collagen on the wounds Amazing to me he is he appears to have compression pumps at home. I am not really sure I knew this before. In any case he has not been using them 4/29; left lateral calf wound continues to get gradually smaller. This looks like it is on its way to closure. oThe area on the right anterior tibial area is about the same in terms of size superficial without any depth. This was probably wrap injury oHe tells me that he has been using his compression pumps once a day for 1 hour without any pain 5/6; not as good today. Left lateral calf wound is actually larger and he does not  have quite as good edema control. The area on the right anterior tibia is about the same still superficial. He states he is using his compression pumps once a day. We are using 3 layer compression on the left and Curlex Coban on the right 5/13; the area on the right is very close to being healed. The area on the left still has some depth but has a healthy wound surface. He has excellent edema control bilaterally. Using Hydrofera Blue 5/20-Patient returns in 1 week  after being in 3 layer compression on the left, the left lateral calf ulcer is stable with some slough, the right anterior tibial ulcer is healed we have been using Kerlix Coban on that leg 5/27; Hydrofera Blue and 3 layer compression. Still some slough in the wound area and some nonviable edges around the wound. 6/3; this patient has lymphedema and chronic venous insufficiency. He had difficult to close wound on his left lateral calf. This is closed today. 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 Finally he has known PAD but he is managed to tolerate the compression we reporting on him and also using his compression pumps that he already had at home twice a day 6/10; we discharge this patient last week having finally close the small area in the left lateral calf. Apparently by Friday of last week this had opened and was draining. He is therefore back in clinic. His wife who is present also was concerned about an area on the right anterior tibia."o 6/17; the patient does not have an open area on the right leg. On the left he has a small circular area anteriorly and the same area laterally that he had last time. However both of these appear to be improved he has his Farrow wrap 30/40. It turns out that he is only using his external compression pumps once a day. I tried to get him up to twice a day today 6/24; the patient comes in with his external compression garment on the right leg/Farrow wrap. As far as we know no open wounds here. He has 2 areas that are smaller one on the left anterior pretibial and one on the left lateral calf which is the site of his initial wounds. I have successfully caught him into his external compression pumps twice a day 7/1; still using his external compression garment on the right leg. As far as we know there are no open wounds here. The area on the left lateral calf which was the site of his initial wound has closed over. The  area on the left anterior pretibial that we identified last week is still open. 7/8; as far as we know he has no open wounds on the right leg. He has his external compression garment on here. The original wound on the left lateral calf has closed over. He developed an area on the left anterior pretibial 2 weeks ago and seems to have 2 smaller areas on the proximal and distal lateral calf. We do not have his good edema control this week as I am used to seeing 7/15; the patient has still a linear area on the left anterior tibia. The area on the left lateral leg looked somewhat inflamed today but no cellulitis. He did have what looked to be a small pustule that I cultured for CandS although I did not give him empiric antibiotics. 7/22; small area on the left anterior tibia. The area on the left lateral leg still  looks inflamed. The pustule that I cultured last week grew Enterobacter and Pseudomonas. I prescribed Cipro they still have not picked that up. In any case the area actually looks fairly satisfactory. He has decent edema control using his compression pumps twice a day 7/29; the area on the left lateral leg looks a lot better than last week. Nothing is open here. He has an area just medial to the mid anterior tibia that is still open that is only open wound today. 8/5-Patient returns at 1 week for the left leg ulcer, which is actually looking better, he is also complaining of left plantar foot pain below the great toe. He does have a hematoma here THORNE, SIEGENTHALER ([id number]) 8/12-Patient returns at 1 week, the left leg open area looks about the same if not slightly better, the left plantar hematoma under the great toe is about the same, patient apparently has not been resting his foot on anything for too long period of time and does not wear the [employer]kenstock shoes unless he walks outdoors which he does not do often 8/19; small wound on the left anterior lower leg this is just about closed  there is nothing on the left lateral leg. He is using his compression pumps He arrives in clinic today with a black eschar over the right first plantar metatarsal head. This was apparently a bruise that was identified by her nurses last week. He wears crocs on his feet. He also may rub these on a footboard at home 8/26; there is nothing open on the left lateral lower leg he has a small area just medial to the tibia. He is going to see his podiatrist Dr. Milinda Pointer about the area on the left plantar first metatarsal head. He is using his external compression pumps. He has a juxta lite on the right leg 9/2; there is nothing open on either side of her left lower leg. He went to see podiatry about the area on the left first metatarsal head. This was apparently debrided. He was given mupirocin and oral antibiotics. I do not believe he is offloading this at all. Readmission: 04/02/2019 upon evaluation today patient actually appears for reevaluation here in our clinic secondary to issues that he is having with his left lower extremity. He has been tolerating the dressing changes without complication. Fortunately there is no signs of active infection at this time. No fevers, chills, nausea, vomiting, or diarrhea. He has been in the hospital since we last saw him and then subsequently transferred back to [facility]. He tells me that they have been changing his dressing once a day and that he has had a significant amount of pain secondary to undergoing these dressing changes. Fortunately there is no evidence of active infection at this point which is good news. No fevers, chills, nausea, vomiting, or diarrhea. With that being said he wonders if we can go back to doing what we were doing before which was when he actually had the dressing changes performed once a week here in the clinic he tells me that he did very well with that. At that point he tolerated a 3 layer compression wrap which was approved by  vascular, Dr. Tyrell Antonio office, to be used previous although 4-layer compression wrap is probably too much for him. 04/16/2019 on evaluation today patient appears to be doing decently well with regard to his lower extremity although he has had a lot of drainage from his leg. A lot of this I think is just  secondary to the lymphedema with that being said he does have some odor as well which may again just be due to more of the excessive moisture although I think that he may also have something that is a result of bacteria again he has somewhat of a Pseudomonas-like smell to the drainage and fortunately though he is not showing any signs of severe cellulitis he is still experiencing quite a bit of drainage and the discoloration is somewhat of a yellow/green in color which is consistent with Pseudomonas. If he can tolerated I think he may do well with Cipro. 04/24/2019 on evaluation today patient appears to be doing well with regard to his wound in the gluteal region which is healing quite well. That is excellent news. With that being said he is also doing better with regard to his left lower extremity overall I feel like things are definitely showing signs of improvement. 12/28-Patient returns with regards to his gluteal wound that is improving, Left lower leg wounds with significant edema in the lower legs that appear to be persistent. Patient is in compression and has lymphedema pumps at home but apparently he does not use consistently 05/07/2019 Upon inspection patient's wound bed actually showed signs on evaluation today patient's wound currently showed signs of improvement. There is still a lot of maceration but it does look like that there is been some of this maceration occurring as a result of drainage in particular and some as a result of the fact that they have been putting bag balm on the wound area as well as the leg in general. I think this is applying too much moisture and not doing him any  good. I discussed with the patient and his wife today that I think that practice needs to be adjusted I would recommend using a different type of lotion in order to help with moisture around the wound but I would not recommend anything directly on the wound itself. 05/14/2019 on evaluation today patient actually appears to be doing much better in regard to his lower extremity. They have not been using any of the bag balm on the area I think this has made a tremendous improvement overall in his wounds. He has a lot of new skin growth and though there still are a lot of open wounds scattered throughout the circumferential lower leg this overall seems to be doing much better to me. 05/21/2019 upon evaluation today patient appears to be doing well in regard to his lower extremity although he is having unfortunately some blue-green drainage noted at this point. The does have me concerned for a reinitiation of the Pseudomonas infection that we previously treated in December. The last time I gave him a prescription was April 16, 2019. That was a little over a month ago. Nonetheless he does have more green drainage noted today and I think this is indicative of potentially a revving up infection. Obviously we do not want to get to that point. ALDINE, GORNIK ([id number]) 05/28/2019 on evaluation today patient appears to be doing better with regard to his lower extremity at this point. Overall I am seeing signs of improvement which is good news. Fortunately there is no evidence of active infection at this time. I do believe that the Cipro has been beneficial for him that was prescribed last week for 14 days he is halfway through that and already appears to be doing much better. This is excellent news. 06/04/2019 upon evaluation today I do believe that the patient  is doing better with regard to his lower extremity. I feel like that he has much less in the way of open wounds and drainage and much in provement  in regard to the overall weeping and even his swelling. There is no signs of erythema no obvious evidence of active infection all of which is great news. No fevers, chills, nausea, vomiting, or diarrhea. 06/11/2019 on evaluation today patient appears to be doing better with regard to his lower extremity on the left. He has been tolerating dressing changes without complication. Fortunately there is no signs of active infection at this time. No fevers, chills, nausea, vomiting, or diarrhea. Electronic Signature(s) Signed: 06/11/2019 2:27:56 PM By: Worthy Keeler PA-C Entered By: Worthy Keeler on 06/11/2019 14:27:55 Steven Meza ([id number]) -------------------------------------------------------------------------------- Physical Exam Details Patient Name: Steven Meza Date of Service: 06/11/2019 1:45 PM Medical Record Number: [id number] Patient Account Number: [id number] Date of Birth/Sex: [birth year] (86 y.o. Male) Treating RN: Primary Care Provider: Fulton Reek Other Clinician: Referring Provider: Fulton Reek Treating Provider/Extender: STONE III, Lacreshia Bondarenko Weeks in Treatment: 10 Constitutional Obese and well-hydrated in no acute distress. Respiratory normal breathing without difficulty. Psychiatric this patient is able to make decisions and demonstrates good insight into disease process. Alert and Oriented x 3. pleasant and cooperative. Notes Upon inspection patient still has several open areas over the left lower extremity fortunately these all seem to be drying out that he is getting better and better. There does not appear to be any signs of active infection at this time he is also not having any pain all of which is good news. Electronic Signature(s) Signed: 06/11/2019 2:28:16 PM By: Worthy Keeler PA-C Entered By: Worthy Keeler on 06/11/2019 14:28:15 Steven Meza ([id number]) -------------------------------------------------------------------------------- Physician  Orders Details Patient Name: Steven Meza Date of Service: 06/11/2019 1:45 PM Medical Record Number: [id number] Patient Account Number: [id number] Date of Birth/Sex: 05-Jan-1931 (86 y.o. Male) Treating RN: Cornell Barman Primary Care Provider: Fulton Reek Other Clinician: Referring Provider: Fulton Reek Treating Provider/Extender: Melburn Hake, Ein Rijo Weeks in Treatment: 10 Verbal / Phone Orders: No Diagnosis Coding ICD-10 Coding Meza Description I89.0 Lymphedema, not elsewhere classified I87.2 Venous insufficiency (chronic) (peripheral) L97.822 Non-pressure chronic ulcer of other part of left lower leg with fat layer exposed I10 Essential (primary) hypertension I25.10 Atherosclerotic heart disease of native coronary artery without angina pectoris L84 Corns and callosities Wound Cleansing Wound #6 Left,Circumferential Lower Leg o May shower with protection. o No tub bath. Primary Wound Dressing Wound #6 Left,Circumferential Lower Leg o Silver Alginate Secondary Dressing Wound #6 Left,Circumferential Lower Leg o ABD pad Dressing Change Frequency Wound #6 Left,Circumferential Lower Leg o Change Dressing Monday, Wednesday, Friday - Home Health to change dressings on Wednesdays and Fridays Follow-up Appointments Wound #6 Left,Circumferential Lower Leg o Return Appointment in 1 week. o Nurse Visit as needed Edema Control Wound #6 Left,Circumferential Lower Leg o 3 Layer Compression System - Left Lower Extremity o Compression Pump: Use compression pump on left lower extremity for 60 minutes, twice daily. o Compression Pump: Use compression pump on right lower extremity for 60 minutes, twice daily. Off-Loading Wound #6 Left,Circumferential Lower Leg o Turn and reposition every 2 hours ADRAIN, GIACOMO ([id number]) Home Health Wound #6 Left,Circumferential Lower Leg o [place] Nurse may visit PRN to address  patientos wound care needs. o FACE TO FACE ENCOUNTER: MEDICARE and MEDICAID PATIENTS: I certify that this patient is under my care and that  I had a face-to-face encounter that meets the physician face-to-face encounter requirements with this patient on this date. The encounter with the patient was in whole or in part for the following MEDICAL CONDITION: (primary reason for [place]) MEDICAL NECESSITY: I certify, that based on my findings, NURSING services are a medically necessary home health service. HOME BOUND STATUS: I certify that my clinical findings support that this patient is homebound (i.e., Due to illness or injury, pt requires aid of supportive devices such as crutches, cane, wheelchairs, walkers, the use of special transportation or the assistance of another person to leave their place of residence. There is a normal inability to leave the home and doing so requires considerable and taxing effort. Other absences are for medical reasons / religious services and are infrequent or of short duration when for other reasons). o If current dressing causes regression in wound condition, may D/C ordered dressing product/s and apply Normal Saline Moist Dressing daily until next [place] / Other MD appointment. [place] of regression in wound condition at [phone]. o Please direct any NON-WOUND related issues/requests for orders to patient's Primary Care Physician Patient Medications Allergies: No Known Allergies Notifications Medication Indication Start End Cipro 06/15/2019 DOSE 1 - oral 500 mg tablet - 1 tablet oral taken 2 times a day for 14 days. Do not take vitamins and supplements with this medication Electronic Signature(s) Signed: 06/15/2019 10:05:24 AM By: Worthy Keeler PA-C Previous Signature: 06/11/2019 5:41:25 PM Version By: Worthy Keeler PA-C Previous Signature: 06/12/2019 4:33:39 PM Version By: Gretta Cool, BSN, RN, CWS, Kim RN,  BSN Entered By: Worthy Keeler on 06/15/2019 10:05:23 Steven Meza ([id number]) -------------------------------------------------------------------------------- Problem List Details Patient Name: Steven Meza Date of Service: 06/11/2019 1:45 PM Medical Record Number: [id number] Patient Account Number: [id number] Date of Birth/Sex: 09-20-1930 (86 y.o. Male) Treating RN: Primary Care Provider: Fulton Reek Other Clinician: Referring Provider: Fulton Reek Treating Provider/Extender: Melburn Hake, Olanna Percifield Weeks in Treatment: 10 Active Problems ICD-10 Evaluated Encounter Meza Description Active Date Today Diagnosis I89.0 Lymphedema, not elsewhere classified 04/02/2019 No Yes I87.2 Venous insufficiency (chronic) (peripheral) 04/02/2019 No Yes L97.822 Non-pressure chronic ulcer of other part of left lower leg with 04/02/2019 No Yes fat layer exposed I10 Essential (primary) hypertension 04/02/2019 No Yes I25.10 Atherosclerotic heart disease of native coronary artery 04/02/2019 No Yes without angina pectoris L84 Corns and callosities 05/09/2019 No Yes Inactive Problems Resolved Problems Electronic Signature(s) Signed: 06/11/2019 2:12:15 PM By: Worthy Keeler PA-C Entered By: Worthy Keeler on 06/11/2019 14:12:15 Steven Meza ([id number]) -------------------------------------------------------------------------------- Progress Note Details Patient Name: Steven Meza Date of Service: 06/11/2019 1:45 PM Medical Record Number: [id number] Patient Account Number: [id number] Date of Birth/Sex: Nov 16, 1930 (86 y.o. Male) Treating RN: Primary Care Provider: Fulton Reek Other Clinician: Referring Provider: Fulton Reek Treating Provider/Extender: Melburn Hake, Aleane Wesenberg Weeks in Treatment: 10 Subjective Chief Complaint Information obtained from Patient Left LE Ulcer History of Present Illness (HPI) ADMISSION 05/17/2018 Mr. Wetz is an 86 year old man who is been treated at the  lymphedema clinic for a long period with lymphedema wraps for bilateral lower extremity lymphedema. About 6 months ago they noted small open areas on his left lateral calf just above the ankle. These were open. They are apparently putting some form of ointment on this. They have been referred here for our review of this. The patient has a long history of lymphedema and venous stasis. It says in his records that he has recurrent cellulitis although  his wife denies this but she states that the lymphedema may have started with cellulitis several years ago. Also in his record there is a history of bladder cancer which they seem to know very little about however he did not have any radiation to the pelvis or anything that could have contributed to lymphedema that they are aware of. There is no prior wound history. The patient has 2 small punched out areas on the left lateral calf that have adherent debris on the surface. Past medical history; lymphedema, hypertension, cellulitis, hearing loss, morbid obesity, osteoarthritis, venous stasis, bladder CA, diverticulosis. ABIs in our clinic were 0.36 on the right and 0.57 on the left 05/24/2018; corrected age on this patient is 73 versus what I said last week. He has been for his arterial studies which predictably are not very good. On the right his ABI is 0.65. Monophasic waveforms noted at the right ankle including the posterior tibial and dorsalis pedis. He has triphasic waveforms of the common femoral and profunda femoris triphasic proximal SFA with monophasic distal FSA and popliteal artery. Monophasic tibial waveforms. On the left his ABI is 0.58 monophasic waveforms at the left ankle. Duplex of the left lower extremity demonstrated atherosclerotic change with monophasic waveforms throughout the left lower extremity. There was some concern about left iliac disease and potentially femoral-popliteal and tibial disease. The patient does not describe  claudication although according to his wife he over emphasizes his activity. Patient states he is limited by pain in both knees His wounds are on the left lateral calf. 2 small areas with necrotic debris. We have been using silver collagen and light Ace wraps 05/31/2018; wounds on the left lateral calf in the setting of very significant lymphedema and probably PAD. We have been using silver collagen. The base of the small wound looks reasonably improved. I sent him to see Dr. Fletcher Anon however I see now he has an appointment with Dr. Alvester Chou on February 12 2/5; left lateral calf wound looks the same. His wife is doing a good job with her lymphedema wraps and maintaining that the edema. He most likely has PAD and is due to see Dr. Gwenlyn Found of infectious disease on February 12 2/19; left lateral calf wounds look about the same. This looks like wound secondary to chronic venous insufficiency with lymphedema however the patient has very poor arterial studies. We referred him to Dr. Gwenlyn Found. Dr. Gwenlyn Found did not feel he needed to do anything from an arterial point of view. He did not address the question about the aggressiveness of compression. After some thoughts about this I elected to go ahead and put him in 3 layer compression which after all would be less compression then the lymphedema clinic was putting on this. I am hopeful that this should be enough to get some closure of the small wounds 2/26; left lateral calf wound letter this week. He tolerated the 3 layer compression well furthermore he is sleeping in a hospital JYLEN, RICHNER A. ([id number]) bed which helps keep his legs up at night. The wound looks better measuring smaller especially in width 3/3; left lateral calf wound about the same size. The 3 layer compression has really helped get the edema down in his leg however. Using silver collagen 3/11; left lateral calf wound better looking wound surface but about the same size. We have been using 3 layer  compression which is really helped get the edema down in the left leg. Even after Dr. Kennon Holter assessment I am reluctant  to go to 4 layer compression. He is tolerating 3 layer compression well. We have been using silver collagen 3/18-Patient returns for his left lateral calf wound which overall is looking better, slightly increased in size and dimensions and area, he is tolerating the 3 layer compression, has been dressed with silver collagen which we will continue 3/25 left lateral calf wound much better looking. Size is smaller. He has been using silver collagen 4/1; gradually getting smaller in size. Using silver collagen with 3 layer compression. I think the patient has some degree of PAD. He saw Dr. Gwenlyn Found in consultation, he did not specifically comment on whether he could tolerate 4 layer compression 4/8; the wound is slightly smaller in depth. We are using 3 layer compression with silver collagen. I had him seen for arterial insufficiency however his ABI was 0.57 in the left leg. Dr. Gwenlyn Found really did not seem to middle on the degree of compression he could tolerate 4/15; the wound is slightly smaller and slightly less deep. We have been using 3 layer compression with silver collagen. There is not an option for 4-layer compression here. 4/22; the patient is making decent progress on his wound on the left lateral calf however he arrives in with a superficial wound on the right anterior tibial area. Were not really sure how this happened. He had been using a stocking on the right leg. He has been using silver collagen on the wounds Amazing to me he is he appears to have compression pumps at home. I am not really sure I knew this before. In any case he has not been using them 4/29; left lateral calf wound continues to get gradually smaller. This looks like it is on its way to closure. The area on the right anterior tibial area is about the same in terms of size superficial without any depth.  This was probably wrap injury He tells me that he has been using his compression pumps once a day for 1 hour without any pain 5/6; not as good today. Left lateral calf wound is actually larger and he does not have quite as good edema control. The area on the right anterior tibia is about the same still superficial. He states he is using his compression pumps once a day. We are using 3 layer compression on the left and Curlex Coban on the right 5/13; the area on the right is very close to being healed. The area on the left still has some depth but has a healthy wound surface. He has excellent edema control bilaterally. Using Hydrofera Blue 5/20-Patient returns in 1 week after being in 3 layer compression on the left, the left lateral calf ulcer is stable with some slough, the right anterior tibial ulcer is healed we have been using Kerlix Coban on that leg 5/27; Hydrofera Blue and 3 layer compression. Still some slough in the wound area and some nonviable edges around the wound. 6/3; this patient has lymphedema and chronic venous insufficiency. He had difficult to close wound on his left lateral calf. This is closed today. 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 Finally he has known PAD but he is managed to tolerate the compression we reporting on him and also using his compression pumps that he already had at home twice a day 6/10; we discharge this patient last week having finally close the small area in the left lateral calf. Apparently by Friday of last week this had opened and  was draining. He is therefore back in clinic. His wife who is present also was concerned about an area on the right anterior tibia."o 6/17; the patient does not have an open area on the right leg. On the left he has a small circular area anteriorly and the same area laterally that he had last time. However both of these appear to be improved he has his Farrow wrap 30/40. It turns  out that he is only using his external compression pumps once a day. I tried to get him up to twice a day today 6/24; the patient comes in with his external compression garment on the right leg/Farrow wrap. As far as we know no open wounds here. He has 2 areas that are smaller one on the left anterior pretibial and one on the left lateral calf which is the site of his initial wounds. I have successfully caught him into his external compression pumps twice a day 7/1; still using his external compression garment on the right leg. As far as we know there are no open wounds here. The area on the left lateral calf which was the site of his initial wound has closed over. The area on the left anterior pretibial that we identified last week is still open. 7/8; as far as we know he has no open wounds on the right leg. He has his external compression garment on here. The original wound on the left lateral calf has closed over. He developed an area on the left anterior pretibial 2 weeks ago and seems to have 2 smaller areas on the proximal and distal lateral calf. We do not have his good edema control this week as I am used to seeing 7/15; the patient has still a linear area on the left anterior tibia. The area on the left lateral leg looked somewhat inflamed today but no cellulitis. He did have what looked to be a small pustule that I cultured for CandS although I did not give him empiric antibiotics. 7/22; small area on the left anterior tibia. The area on the left lateral leg still looks inflamed. The pustule that I cultured last GAMALIEL, LUNDIE. ([id number]) week grew Enterobacter and Pseudomonas. I prescribed Cipro they still have not picked that up. In any case the area actually looks fairly satisfactory. He has decent edema control using his compression pumps twice a day 7/29; the area on the left lateral leg looks a lot better than last week. Nothing is open here. He has an area just medial to  the mid anterior tibia that is still open that is only open wound today. 8/5-Patient returns at 1 week for the left leg ulcer, which is actually looking better, he is also complaining of left plantar foot pain below the great toe. He does have a hematoma here 8/12-Patient returns at 1 week, the left leg open area looks about the same if not slightly better, the left plantar hematoma under the great toe is about the same, patient apparently has not been resting his foot on anything for too long period of time and does not wear the [employer] shoes unless he walks outdoors which he does not do often 8/19; small wound on the left anterior lower leg this is just about closed there is nothing on the left lateral leg. He is using his compression pumps He arrives in clinic today with a black eschar over the right first plantar metatarsal head. This was apparently a bruise that was identified by her  nurses last week. He wears crocs on his feet. He also may rub these on a footboard at home 8/26; there is nothing open on the left lateral lower leg he has a small area just medial to the tibia. He is going to see his podiatrist Dr. Milinda Pointer about the area on the left plantar first metatarsal head. He is using his external compression pumps. He has a juxta lite on the right leg 9/2; there is nothing open on either side of her left lower leg. He went to see podiatry about the area on the left first metatarsal head. This was apparently debrided. He was given mupirocin and oral antibiotics. I do not believe he is offloading this at all. Readmission: 04/02/2019 upon evaluation today patient actually appears for reevaluation here in our clinic secondary to issues that he is having with his left lower extremity. He has been tolerating the dressing changes without complication. Fortunately there is no signs of active infection at this time. No fevers, chills, nausea, vomiting, or diarrhea. He has been in the hospital  since we last saw him and then subsequently transferred back to [facility]. He tells me that they have been changing his dressing once a day and that he has had a significant amount of pain secondary to undergoing these dressing changes. Fortunately there is no evidence of active infection at this point which is good news. No fevers, chills, nausea, vomiting, or diarrhea. With that being said he wonders if we can go back to doing what we were doing before which was when he actually had the dressing changes performed once a week here in the clinic he tells me that he did very well with that. At that point he tolerated a 3 layer compression wrap which was approved by vascular, Dr. Tyrell Antonio office, to be used previous although 4-layer compression wrap is probably too much for him. 04/16/2019 on evaluation today patient appears to be doing decently well with regard to his lower extremity although he has had a lot of drainage from his leg. A lot of this I think is just secondary to the lymphedema with that being said he does have some odor as well which may again just be due to more of the excessive moisture although I think that he may also have something that is a result of bacteria again he has somewhat of a Pseudomonas-like smell to the drainage and fortunately though he is not showing any signs of severe cellulitis he is still experiencing quite a bit of drainage and the discoloration is somewhat of a yellow/green in color which is consistent with Pseudomonas. If he can tolerated I think he may do well with Cipro. 04/24/2019 on evaluation today patient appears to be doing well with regard to his wound in the gluteal region which is healing quite well. That is excellent news. With that being said he is also doing better with regard to his left lower extremity overall I feel like things are definitely showing signs of improvement. 12/28-Patient returns with regards to his gluteal wound that is  improving, Left lower leg wounds with significant edema in the lower legs that appear to be persistent. Patient is in compression and has lymphedema pumps at home but apparently he does not use consistently 05/07/2019 Upon inspection patient's wound bed actually showed signs on evaluation today patient's wound currently showed signs of improvement. There is still a lot of maceration but it does look like that there is been some of this maceration  occurring as a result of drainage in particular and some as a result of the fact that they have been putting bag balm on the wound area as well as the leg in general. I think this is applying too much moisture and not doing him any good. I discussed with the patient and his wife today that I think that practice needs to be adjusted I would recommend using a different type of lotion in order to help with moisture around the wound but I would not recommend anything directly on the wound itself. 05/14/2019 on evaluation today patient actually appears to be doing much better in regard to his lower extremity. They have not been using any of the bag balm on the area I think this has made a tremendous improvement overall in his wounds. He has a lot of new skin growth and though there still are a lot of open wounds scattered throughout the circumferential lower leg this overall seems to be doing much better to me. RAYLYNN, DELLA ([id number]) 05/21/2019 upon evaluation today patient appears to be doing well in regard to his lower extremity although he is having unfortunately some blue-green drainage noted at this point. The does have me concerned for a reinitiation of the Pseudomonas infection that we previously treated in December. The last time I gave him a prescription was April 16, 2019. That was a little over a month ago. Nonetheless he does have more green drainage noted today and I think this is indicative of potentially a revving up infection. Obviously we  do not want to get to that point. 05/28/2019 on evaluation today patient appears to be doing better with regard to his lower extremity at this point. Overall I am seeing signs of improvement which is good news. Fortunately there is no evidence of active infection at this time. I do believe that the Cipro has been beneficial for him that was prescribed last week for 14 days he is halfway through that and already appears to be doing much better. This is excellent news. 06/04/2019 upon evaluation today I do believe that the patient is doing better with regard to his lower extremity. I feel like that he has much less in the way of open wounds and drainage and much in provement in regard to the overall weeping and even his swelling. There is no signs of erythema no obvious evidence of active infection all of which is great news. No fevers, chills, nausea, vomiting, or diarrhea. 06/11/2019 on evaluation today patient appears to be doing better with regard to his lower extremity on the left. He has been tolerating dressing changes without complication. Fortunately there is no signs of active infection at this time. No fevers, chills, nausea, vomiting, or diarrhea. Objective Constitutional Obese and well-hydrated in no acute distress. Vitals Time Taken: 2:05 PM, Height: 65 in, Weight: 273 lbs, BMI: 45.4, Temperature: 97.7 F, Pulse: 82 bpm, Respiratory Rate: 18 breaths/min, Blood Pressure: 155/51 mmHg. Respiratory normal breathing without difficulty. Psychiatric this patient is able to make decisions and demonstrates good insight into disease process. Alert and Oriented x 3. pleasant and cooperative. General Notes: Upon inspection patient still has several open areas over the left lower extremity fortunately these all seem to be drying out that he is getting better and better. There does not appear to be any signs of active infection at this time he is also not having any pain all of which is good  news. Integumentary (Hair, Skin) Wound #6 status is Open.  Original cause of wound was Gradually Appeared. The wound is located on the Left,Circumferential Lower Leg. The wound measures 9cm length x 30cm width x 0.1cm depth; 212.058cm^2 area and 21.206cm^3 volume. There is Fat Layer (Subcutaneous Tissue) Exposed exposed. There is no tunneling or undermining noted. There is a medium amount of serous drainage noted. The wound margin is indistinct and nonvisible. There is large (67-100%) pink granulation within the wound bed. There is a small (1-33%) amount of necrotic tissue within the wound bed including Adherent Slough. DVON, KUDELKA ([id number]) Assessment Active Problems ICD-10 Lymphedema, not elsewhere classified Venous insufficiency (chronic) (peripheral) Non-pressure chronic ulcer of other part of left lower leg with fat layer exposed Essential (primary) hypertension Atherosclerotic heart disease of native coronary artery without angina pectoris Corns and callosities Procedures Wound #6 Pre-procedure diagnosis of Wound #6 is a Venous Leg Ulcer located on the Left,Circumferential Lower Leg . There was a Three Layer Compression Therapy Procedure by Cornell Barman, RN. Post procedure Diagnosis Wound #6: Same as Pre-Procedure Plan Wound Cleansing: Wound #6 Left,Circumferential Lower Leg: May shower with protection. No tub bath. Primary Wound Dressing: Wound #6 Left,Circumferential Lower Leg: Silver Alginate Secondary Dressing: Wound #6 Left,Circumferential Lower Leg: ABD pad Dressing Change Frequency: Wound #6 Left,Circumferential Lower Leg: Change Dressing Monday, Wednesday, Friday - Home Health to change dressings on Wednesdays and Fridays Follow-up Appointments: Wound #6 Left,Circumferential Lower Leg: Return Appointment in 1 week. Nurse Visit as needed Edema Control: Wound #6 Left,Circumferential Lower Leg: 3 Layer Compression System - Left Lower Extremity Compression  Pump: Use compression pump on left lower extremity for 60 minutes, twice daily. Compression Pump: Use compression pump on right lower extremity for 60 minutes, twice daily. Off-Loading: Wound #6 Left,Circumferential Lower Leg: Turn and reposition every 2 hours CIRO, AVALLONE ([id number]) Home Health: Wound #6 Left,Circumferential Lower Leg: [place] Nurse may visit PRN to address patient s wound care needs. FACE TO FACE ENCOUNTER: MEDICARE and MEDICAID PATIENTS: I certify that this patient is under my care and that I had a face-to-face encounter that meets the physician face-to-face encounter requirements with this patient on this date. The encounter with the patient was in whole or in part for the following MEDICAL CONDITION: (primary reason for [place]) MEDICAL NECESSITY: I certify, that based on my findings, NURSING services are a medically necessary home health service. HOME BOUND STATUS: I certify that my clinical findings support that this patient is homebound (i.e., Due to illness or injury, pt requires aid of supportive devices such as crutches, cane, wheelchairs, walkers, the use of special transportation or the assistance of another person to leave their place of residence. There is a normal inability to leave the home and doing so requires considerable and taxing effort. Other absences are for medical reasons / religious services and are infrequent or of short duration when for other reasons). If current dressing causes regression in wound condition, may D/C ordered dressing product/s and apply Normal Saline Moist Dressing daily until next [place] / Other MD appointment. [place] of regression in wound condition at [phone]. Please direct any NON-WOUND related issues/requests for orders to patient's Primary Care Physician The following medication(s) was prescribed: Cipro oral 500 mg tablet 1 1  tablet oral taken 2 times a day for 14 days. Do not take vitamins and supplements with this medication starting 06/15/2019 1. I would recommend currently that we go ahead and initiate treatment with a continuation of  the silver alginate dressing I do feel like this done well for him at this point. 2. I am also going to suggest at this time that we go ahead and continue as well with elevation as much as possible to help keep edema under control. 3. Also recommend that the patient needs to continue using his lymphedema pumps 1 hour 2 times a day I think that is good to be very helpful. Obviously all this combined can help keep the edema under control and help get these areas to heal up quite nicely. We will see patient back for reevaluation in 1 week here in the clinic. If anything worsens or changes patient will contact our office for additional recommendations. 06/15/2019 patient's wife contacted our office today stating that the home health nurse was out and found that the patient's wounds seem to be doing worse in regard to his lower extremity. He is having more odor and drainage according to the patient's wife and the home health nurse. Nonetheless they wondered if he needed antibiotic or something at this point to help fight this. I am going to go and send in a refill for the Cipro since he did well with this previously he needs to make sure not to take any multivitamins along with this to make it more effective. We will see where things stand on Monday when I see him back for follow-up. Electronic Signature(s) Signed: 06/15/2019 10:06:16 AM By: Worthy Keeler PA-C Previous Signature: 06/11/2019 2:29:28 PM Version By: Worthy Keeler PA-C Entered By: Worthy Keeler on 06/15/2019 10:06:15 Steven Meza ([id number]) -------------------------------------------------------------------------------- SuperBill Details Patient Name: Steven Meza Date of Service: 06/11/2019 Medical Record  Number: [id number] Patient Account Number: [id number] Date of Birth/Sex: 1930/11/29 (86 y.o. Male) Treating RN: Cornell Barman Primary Care Provider: Fulton Reek Other Clinician: Referring Provider: Fulton Reek Treating Provider/Extender: Melburn Hake, Xue Low Weeks in Treatment: 10 Diagnosis Coding ICD-10 Codes Meza Description I89.0 Lymphedema, not elsewhere classified I87.2 Venous insufficiency (chronic) (peripheral) L97.822 Non-pressure chronic ulcer of other part of left lower leg with fat layer exposed I10 Essential (primary) hypertension I25.10 Atherosclerotic heart disease of native coronary artery without angina pectoris L84 Corns and callosities Facility Procedures CPT4 Meza: [id number] Description: (Facility Use Only) [id number] - APPLY MULTLAY COMPRS LWR LT LEG Modifier: Quantity: 1 Physician Procedures CPT4 Meza Description: [id number] - WC PHYS LEVEL 3 - EST PT ICD-10 Diagnosis Description I89.0 Lymphedema, not elsewhere classified I87.2 Venous insufficiency (chronic) (peripheral) 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

## 2019-06-12 NOTE — Progress Notes (Signed)
Steven Meza ([id number]) Visit Report for 06/11/2019 Arrival Information Details Patient Name: Steven Meza, Steven Meza Date of Service: 06/11/2019 1:45 PM Medical Record Number: [id number] Patient Account Number: [id number] Date of Birth/Sex: 1930-10-27 (86 y.o. M) Treating RN: Primary Care Solomia Harrell: Fulton Reek Other Clinician: Referring Darbie Biancardi: Fulton Reek Treating Sarahlynn Cisnero/Extender: Melburn Hake, HOYT Weeks in Treatment: 10 Visit Information History Since Last Visit Added or deleted any medications: No Patient Arrived: Walker Any new allergies or adverse reactions: No Arrival Time: 14:03 Had a fall or experienced change in No Accompanied By: wife activities of daily living that may affect Transfer Assistance: None risk of falls: Patient Identification Verified: Yes Signs or symptoms of abuse/neglect since last visito No Secondary Verification Process Completed: Yes Hospitalized since last visit: No Implantable device outside of the clinic excluding No cellular tissue based products placed in the center since last visit: Has Dressing in Place as Prescribed: Yes Has Compression in Place as Prescribed: Yes Pain Present Now: No Electronic Signature(s) Signed: 06/11/2019 4:25:10 PM By: Lorine Bears RCP, RRT, CHT Entered By: Lorine Bears on 06/11/2019 14:04:37 Steven Meza ([id number]) -------------------------------------------------------------------------------- Compression Therapy Details Patient Name: Steven Meza Date of Service: 06/11/2019 1:45 PM Medical Record Number: [id number] Patient Account Number: [id number] Date of Birth/Sex: Apr 12, 1931 (86 y.o. M) Treating RN: Cornell Barman Primary Care Sayre Witherington: Fulton Reek Other Clinician: Referring Yoselyn Mcglade: Fulton Reek Treating Oaklan Persons/Extender: Melburn Hake, HOYT Weeks in Treatment: 10 Compression Therapy Performed for Wound Assessment: Wound #6 Left,Circumferential Lower  Leg Performed By: Clinician Cornell Barman, RN Compression Type: Three Layer Post Procedure Diagnosis Same as Pre-procedure Electronic Signature(s) Signed: 06/12/2019 4:33:39 PM By: Gretta Cool, BSN, RN, CWS, Kim RN, BSN Entered By: Gretta Cool, BSN, RN, CWS, Kim on 06/11/2019 14:23:53 Steven Meza ([id number]) -------------------------------------------------------------------------------- Encounter Discharge Information Details Patient Name: Steven Meza Date of Service: 06/11/2019 1:45 PM Medical Record Number: [id number] Patient Account Number: [id number] Date of Birth/Sex: [birth year] (86 y.o. M) Treating RN: Cornell Barman Primary Care Alfreida Steffenhagen: Fulton Reek Other Clinician: Referring Jasmyn Picha: Fulton Reek Treating Yoshiharu Brassell/Extender: Melburn Hake, HOYT Weeks in Treatment: 10 Encounter Discharge Information Items Discharge Condition: Stable Ambulatory Status: Ambulatory Discharge Destination: Home Transportation: Other Accompanied By: wife Schedule Follow-up Appointment: Yes Clinical Summary of Care: Electronic Signature(s) Signed: 06/12/2019 4:33:39 PM By: Gretta Cool, BSN, RN, CWS, Kim RN, BSN Entered By: Gretta Cool, BSN, RN, CWS, Kim on 06/11/2019 14:24:51 Steven Meza ([id number]) -------------------------------------------------------------------------------- Lower Extremity Assessment Details Patient Name: Steven Meza Date of Service: 06/11/2019 1:45 PM Medical Record Number: [id number] Patient Account Number: [id number] Date of Birth/Sex: 05-12-1930 (86 y.o. M) Treating RN: Montey Hora Primary Care Kevan Prouty: Fulton Reek Other Clinician: Referring Morine Kohlman: Fulton Reek Treating Eesa Justiss/Extender: Melburn Hake, HOYT Weeks in Treatment: 10 Edema Assessment Assessed: [Left: No] [Right: No] Edema: [Left: Ye] [Right: s] Calf Left: Right: Point of Measurement: 33 cm From Medial Instep 43.5 cm cm Ankle Left: Right: Point of Measurement: 12 cm From Medial Instep 28 cm  cm Vascular Assessment Pulses: Dorsalis Pedis Palpable: [Left:Yes] Electronic Signature(s) Signed: 06/11/2019 4:23:54 PM By: Montey Hora Entered By: Montey Hora on 06/11/2019 14:12:11 Steven Meza ([id number]) -------------------------------------------------------------------------------- Multi Wound Chart Details Patient Name: Steven Meza Date of Service: 06/11/2019 1:45 PM Medical Record Number: [id number] Patient Account Number: [id number] Date of Birth/Sex: 11-18-1930 (86 y.o. M) Treating RN: Cornell Barman Primary Care Ramey Schiff: Fulton Reek Other Clinician: Referring Romi Rathel: Fulton Reek Treating Paizley Ramella/Extender: Melburn Hake, HOYT Weeks in Treatment: 10 Vital Signs Height(in): 65 Pulse(bpm): 82 Weight(lbs): [id number] Blood Pressure(mmHg):  155/51 Body Mass Index(BMI): 45 Temperature(F): 97.7 Respiratory Rate 18 (breaths/min): Photos: [N/A:N/A] Wound Location: Left Lower Leg - N/A N/A Circumferential Wounding Event: Gradually Appeared N/A N/A Primary Etiology: Venous Leg Ulcer N/A N/A Comorbid History: Lymphedema, Coronary Artery N/A N/A Disease, Hypertension Date Acquired: 03/02/2019 N/A N/A Weeks of Treatment: 10 N/A N/A Wound Status: Open N/A N/A Measurements L x W x D 9x30x0.1 N/A N/A (cm) Area (cm) : 212.058 N/A N/A Volume (cm) : 21.206 N/A N/A % Reduction in Area: 23.10% N/A N/A % Reduction in Volume: 23.10% N/A N/A Classification: Partial Thickness N/A N/A Exudate Amount: Medium N/A N/A Exudate Type: Serous N/A N/A Exudate Color: amber N/A N/A Wound Margin: Indistinct, nonvisible N/A N/A Steven Meza ([id number]) Granulation Amount: Large (67-100%) N/A N/A Granulation Quality: Pink N/A N/A Necrotic Amount: Small (1-33%) N/A N/A Exposed Structures: Fat Layer (Subcutaneous N/A N/A Tissue) Exposed: Yes Fascia: No Tendon: No Muscle: No Joint: No Bone: No Epithelialization: Medium (34-66%) N/A N/A Treatment Notes Electronic  Signature(s) Signed: 06/12/2019 4:33:39 PM By: Gretta Cool, BSN, RN, CWS, Kim RN, BSN Entered By: Gretta Cool, BSN, RN, CWS, Kim on 06/11/2019 14:20:29 Steven Meza ([id number]) -------------------------------------------------------------------------------- Multi-Disciplinary Care Plan Details Patient Name: Steven Meza Date of Service: 06/11/2019 1:45 PM Medical Record Number: [id number] Patient Account Number: [id number] Date of Birth/Sex: [birth year] (86 y.o. M) Treating RN: Cornell Barman Primary Care Layliana Devins: Fulton Reek Other Clinician: Referring Quaniya Damas: Fulton Reek Treating Jaevon Paras/Extender: Melburn Hake, HOYT Weeks in Treatment: 10 Active Inactive Abuse / Safety / Falls / Self Care Management Nursing Diagnoses: Potential for falls Goals: Patient will remain injury free related to falls Date Initiated: 04/24/2019 Target Resolution Date: 07/14/2019 Goal Status: Active Interventions: Assess fall risk on admission and as needed Notes: Venous Leg Ulcer Nursing Diagnoses: Actual venous Insuffiency (use after diagnosis is confirmed) Goals: Patient will maintain optimal edema control Date Initiated: 04/24/2019 Target Resolution Date: 07/14/2019 Goal Status: Active Interventions: Compression as ordered Notes: Electronic Signature(s) Signed: 06/12/2019 4:33:39 PM By: Gretta Cool, BSN, RN, CWS, Kim RN, BSN Entered By: Gretta Cool, BSN, RN, CWS, Kim on 06/11/2019 14:20:21 Steven Meza ([id number]) -------------------------------------------------------------------------------- Pain Assessment Details Patient Name: Steven Meza Date of Service: 06/11/2019 1:45 PM Medical Record Number: [id number] Patient Account Number: [id number] Date of Birth/Sex: [birth year] (86 y.o. M) Treating RN: Primary Care Reilynn Lauro: Fulton Reek Other Clinician: Referring Nicolo Tomko: Fulton Reek Treating Daphyne Miguez/Extender: Melburn Hake, HOYT Weeks in Treatment: 10 Active Problems Location of Pain Severity and  Description of Pain Patient Has Paino No Site Locations Pain Management and Medication Current Pain Management: Electronic Signature(s) Signed: 06/11/2019 4:25:10 PM By: Lorine Bears RCP, RRT, CHT Entered By: Lorine Bears on 06/11/2019 14:04:43 Steven Meza ([id number]) -------------------------------------------------------------------------------- Patient/Caregiver Education Details Patient Name: Steven Meza Date of Service: 06/11/2019 1:45 PM Medical Record Number: [id number] Patient Account Number: [id number] Date of Birth/Gender: 1930-12-12 (86 y.o. M) Treating RN: Cornell Barman Primary Care Physician: Fulton Reek Other Clinician: Referring Physician: Fulton Reek Treating Physician/Extender: Sharalyn Ink in Treatment: 10 Education Assessment Education Provided To: Patient Education Topics Provided Wound/Skin Impairment: Handouts: Caring for Your Ulcer Methods: Demonstration, Explain/Verbal Responses: State content correctly Electronic Signature(s) Signed: 06/12/2019 4:33:39 PM By: Gretta Cool, BSN, RN, CWS, Kim RN, BSN Entered By: Gretta Cool, BSN, RN, CWS, Kim on 06/11/2019 14:24:19 Steven Meza ([id number]) -------------------------------------------------------------------------------- Wound Assessment Details Patient Name: Steven Meza Date of Service: 06/11/2019 1:45 PM Medical Record Number: [id number] Patient Account Number: [id number] Date of Birth/Sex: 11-13-1930 (86 y.o. M) Treating RN:  Montey Hora Primary Care Winfrey Chillemi: Fulton Reek Other Clinician: Referring Kinzley Savell: Fulton Reek Treating Herschell Virani/Extender: Melburn Hake, HOYT Weeks in Treatment: 10 Wound Status Wound Number: 6 Primary Venous Leg Ulcer Etiology: Wound Location: Left Lower Leg - Circumferential Wound Status: Open Wounding Event: Gradually Appeared Comorbid Lymphedema, Coronary Artery Disease, Date Acquired: 03/02/2019 History:  Hypertension Weeks Of Treatment: 10 Clustered Wound: No Photos Wound Measurements Length: (cm) 9 Width: (cm) 30 Depth: (cm) 0.1 Area: (cm) 212.058 Volume: (cm) 21.206 % Reduction in Area: 23.1% % Reduction in Volume: 23.1% Epithelialization: Medium (34-66%) Tunneling: No Undermining: No Wound Description Classification: Partial Thickness Foul Odor Wound Margin: Indistinct, nonvisible Slough/Fi Exudate Amount: Medium Exudate Type: Serous Exudate Color: amber After Cleansing: No brino Yes Wound Bed Granulation Amount: Large (67-100%) Exposed Structure Granulation Quality: Pink Fascia Exposed: No Necrotic Amount: Small (1-33%) Fat Layer (Subcutaneous Tissue) Exposed: Yes Necrotic Quality: Adherent Slough Tendon Exposed: No Muscle Exposed: No Joint Exposed: No Bone Exposed: No Treatment Notes BIFF, CHARON ([id number]) Wound #6 (Left, Circumferential Lower Leg) Notes Scell, abd, 3 Layer left leg Electronic Signature(s) Signed: 06/11/2019 4:23:54 PM By: Montey Hora Entered By: Montey Hora on 06/11/2019 14:14:12 Steven Meza ([id number]) -------------------------------------------------------------------------------- Vitals Details Patient Name: Steven Meza Date of Service: 06/11/2019 1:45 PM Medical Record Number: [id number] Patient Account Number: [id number] Date of Birth/Sex: 21-Oct-1930 (86 y.o. M) Treating RN: Primary Care Avelino Herren: Fulton Reek Other Clinician: Referring Leno Mathes: Fulton Reek Treating Hermelinda Diegel/Extender: Melburn Hake, HOYT Weeks in Treatment: 10 Vital Signs Time Taken: 14:05 Temperature (F): 97.7 Height (in): 65 Pulse (bpm): 82 Weight (lbs): 273 Respiratory Rate (breaths/min): 18 Body Mass Index (BMI): 45.4 Blood Pressure (mmHg): 155/51 Reference Range: 80 - 120 mg / dl Electronic Signature(s) Signed: 06/11/2019 4:25:10 PM By: Lorine Bears RCP, RRT, CHT Entered By: Lorine Bears on  06/11/2019 14:08:32

## 2019-06-18 ENCOUNTER — Encounter: Payer: Medicare Other | Admitting: Physician Assistant

## 2019-06-18 ENCOUNTER — Other Ambulatory Visit: Payer: Self-pay

## 2019-06-18 DIAGNOSIS — L97822 Non-pressure chronic ulcer of other part of left lower leg with fat layer exposed: Secondary | ICD-10-CM | POA: Diagnosis not present

## 2019-06-18 NOTE — Progress Notes (Signed)
Steven Meza, Steven Meza ([id number]) Visit Report for 06/18/2019 Arrival Information Details Patient Name: Steven Meza, Steven Meza Date of Service: 06/18/2019 1:45 PM Medical Record Number: [id number] Patient Account Number: [id number] Date of Birth/Sex: [birth year] (86 y.o. M) Treating RN: Cornell Barman Primary Care Ygnacio Fecteau: Fulton Reek Other Clinician: Referring Darol Cush: Fulton Reek Treating Verdie Wilms/Extender: Melburn Hake, HOYT Weeks in Treatment: 11 Visit Information History Since Last Visit Added or deleted any medications: No Patient Arrived: Walker Any new allergies or adverse reactions: No Arrival Time: 13:59 Had a fall or experienced change in No Accompanied By: wife activities of daily living that may affect Transfer Assistance: None risk of falls: Patient Identification Verified: Yes Signs or symptoms of abuse/neglect since last visito No Secondary Verification Process Completed: Yes Hospitalized since last visit: No Implantable device outside of the clinic excluding No cellular tissue based products placed in the center since last visit: Has Dressing in Place as Prescribed: Yes Has Compression in Place as Prescribed: Yes Pain Present Now: No Electronic Signature(s) Signed: 06/18/2019 4:18:59 PM By: Lorine Bears RCP, RRT, CHT Entered By: Lorine Bears on 06/18/2019 14:00:18 Steven Meza ([id number]) -------------------------------------------------------------------------------- Clinic Level of Care Assessment Details Patient Name: Steven Meza Date of Service: 06/18/2019 1:45 PM Medical Record Number: [id number] Patient Account Number: [id number] Date of Birth/Sex: 11/19/1930 (86 y.o. M) Treating RN: Army Melia Primary Care Siobahn Worsley: Fulton Reek Other Clinician: Referring Xzaria Teo: Fulton Reek Treating Nakaya Mishkin/Extender: Melburn Hake, HOYT Weeks in Treatment: 11 Clinic Level of Care Assessment Items TOOL 4 Quantity Score []  - Use  when only an EandM is performed on FOLLOW-UP visit 0 ASSESSMENTS - Nursing Assessment / Reassessment X - Reassessment of Co-morbidities (includes updates in patient status) 1 10 X- 1 5 Reassessment of Adherence to Treatment Plan ASSESSMENTS - Wound and Skin Assessment / Reassessment X - Simple Wound Assessment / Reassessment - one wound 1 5 []  - 0 Complex Wound Assessment / Reassessment - multiple wounds []  - 0 Dermatologic / Skin Assessment (not related to wound area) ASSESSMENTS - Focused Assessment []  - Circumferential Edema Measurements - multi extremities 0 []  - 0 Nutritional Assessment / Counseling / Intervention X- 1 5 Lower Extremity Assessment (monofilament, tuning fork, pulses) []  - 0 Peripheral Arterial Disease Assessment (using hand held doppler) ASSESSMENTS - Ostomy and/or Continence Assessment and Care []  - Incontinence Assessment and Management 0 []  - 0 Ostomy Care Assessment and Management (repouching, etc.) PROCESS - Coordination of Care X - Simple Patient / Family Education for ongoing care 1 15 []  - 0 Complex (extensive) Patient / Family Education for ongoing care X- 1 10 Staff obtains Programmer, systems, Records, Test Results / Process Orders []  - 0 Staff telephones HHA, Nursing Homes / Clarify orders / etc []  - 0 Routine Transfer to another Facility (non-emergent condition) []  - 0 Routine Hospital Admission (non-emergent condition) []  - 0 New Admissions / Biomedical engineer / Ordering NPWT, Apligraf, etc. []  - 0 Emergency Hospital Admission (emergent condition) X- 1 10 Simple Discharge Coordination Steven Meza, Steven Meza ([id number]) []  - 0 Complex (extensive) Discharge Coordination PROCESS - Special Needs []  - Pediatric / Minor Patient Management 0 []  - 0 Isolation Patient Management []  - 0 Hearing / Language / Visual special needs []  - 0 Assessment of Community assistance (transportation, D/C planning, etc.) []  - 0 Additional assistance / Altered  mentation []  - 0 Support Surface(s) Assessment (bed, cushion, seat, etc.) INTERVENTIONS - Wound Cleansing / Measurement X - Simple Wound Cleansing - one wound 1 5 []  -  0 Complex Wound Cleansing - multiple wounds X- 1 5 Wound Imaging (photographs - any number of wounds) []  - 0 Wound Tracing (instead of photographs) X- 1 5 Simple Wound Measurement - one wound []  - 0 Complex Wound Measurement - multiple wounds INTERVENTIONS - Wound Dressings []  - Small Wound Dressing one or multiple wounds 0 X- 1 15 Medium Wound Dressing one or multiple wounds []  - 0 Large Wound Dressing one or multiple wounds []  - 0 Application of Medications - topical []  - 0 Application of Medications - injection INTERVENTIONS - Miscellaneous []  - External ear exam 0 []  - 0 Specimen Collection (cultures, biopsies, blood, body fluids, etc.) []  - 0 Specimen(s) / Culture(s) sent or taken to Lab for analysis []  - 0 Patient Transfer (multiple staff / Civil Service fast streamer / Similar devices) []  - 0 Simple Staple / Suture removal (25 or less) []  - 0 Complex Staple / Suture removal (26 or more) []  - 0 Hypo / Hyperglycemic Management (close monitor of Blood Glucose) []  - 0 Ankle / Brachial Index (ABI) - do not check if billed separately X- 1 5 Vital Signs Steven Meza, Steven Meza ([id number]) Has the patient been seen at the hospital within the last three years: Yes Total Score: 95 Level Of Care: New/Established - Level 3 Electronic Signature(s) Signed: 06/18/2019 4:18:43 PM By: Army Melia Entered By: Army Melia on 06/18/2019 14:38:05 Steven Meza ([id number]) -------------------------------------------------------------------------------- Compression Therapy Details Patient Name: Steven Meza Date of Service: 06/18/2019 1:45 PM Medical Record Number: [id number] Patient Account Number: [id number] Date of Birth/Sex: Jan 01, 1931 (86 y.o. M) Treating RN: Army Melia Primary Care Winton Offord: Fulton Reek Other  Clinician: Referring Krithi Bray: Fulton Reek Treating Ercole Georg/Extender: Melburn Hake, HOYT Weeks in Treatment: 11 Compression Therapy Performed for Wound Assessment: Wound #6 Left,Circumferential Lower Leg Performed By: Clinician Army Melia, RN Compression Type: Three Layer Post Procedure Diagnosis Same as Pre-procedure Electronic Signature(s) Signed: 06/18/2019 4:18:43 PM By: Army Melia Entered By: Army Melia on 06/18/2019 14:35:26 Steven Meza ([id number]) -------------------------------------------------------------------------------- Encounter Discharge Information Details Patient Name: Steven Meza Date of Service: 06/18/2019 1:45 PM Medical Record Number: [id number] Patient Account Number: [id number] Date of Birth/Sex: 20-Apr-1931 (86 y.o. M) Treating RN: Army Melia Primary Care Francile Woolford: Fulton Reek Other Clinician: Referring Jorel Gravlin: Fulton Reek Treating Sheila Gervasi/Extender: Melburn Hake, HOYT Weeks in Treatment: 11 Encounter Discharge Information Items Discharge Condition: Stable Ambulatory Status: Walker Discharge Destination: Home Transportation: Private Auto Accompanied By: wife Schedule Follow-up Appointment: Yes Clinical Summary of Care: Electronic Signature(s) Signed: 06/18/2019 4:18:43 PM By: Army Melia Entered By: Army Melia on 06/18/2019 14:39:06 Steven Meza ([id number]) -------------------------------------------------------------------------------- Lower Extremity Assessment Details Patient Name: Steven Meza Date of Service: 06/18/2019 1:45 PM Medical Record Number: [id number] Patient Account Number: [id number] Date of Birth/Sex: [birth year] (86 y.o. M) Treating RN: Montey Hora Primary Care Jonas Goh: Fulton Reek Other Clinician: Referring Leiyah Maultsby: Fulton Reek Treating Marie Borowski/Extender: Melburn Hake, HOYT Weeks in Treatment: 11 Edema Assessment Assessed: [Left: No] [Right: No] Edema: [Left: Ye] [Right:  s] Calf Left: Right: Point of Measurement: 33 cm From Medial Instep 44 cm cm Ankle Left: Right: Point of Measurement: 12 cm From Medial Instep 28 cm cm Vascular Assessment Pulses: Dorsalis Pedis Palpable: [Left:Yes] Electronic Signature(s) Signed: 06/18/2019 4:29:13 PM By: Montey Hora Entered By: Montey Hora on 06/18/2019 14:11:18 Steven Meza ([id number]) -------------------------------------------------------------------------------- Multi Wound Chart Details Patient Name: Steven Meza Date of Service: 06/18/2019 1:45 PM Medical Record Number: [id number] Patient Account Number: [id number] Date of Birth/Sex: [birth year] (86 y.o. M)  Treating RN: Army Melia Primary Care Patrisia Faeth: Fulton Reek Other Clinician: Referring Donetta Isaza: Fulton Reek Treating Atasha Colebank/Extender: Melburn Hake, HOYT Weeks in Treatment: 11 Vital Signs Height(in): 65 Pulse(bpm): 93 Weight(lbs): 273 Blood Pressure(mmHg): 115/42 Body Mass Index(BMI): 45 Temperature(F): 98.9 Respiratory Rate 18 (breaths/min): Photos: [N/A:N/A] Wound Location: Left Lower Leg - N/A N/A Circumferential Wounding Event: Gradually Appeared N/A N/A Primary Etiology: Venous Leg Ulcer N/A N/A Comorbid History: Lymphedema, Coronary Artery N/A N/A Disease, Hypertension Date Acquired: 03/02/2019 N/A N/A Weeks of Treatment: 11 N/A N/A Wound Status: Open N/A N/A Measurements L x W x D 15x35x0.1 N/A N/A (cm) Area (cm) : 412.334 N/A N/A Volume (cm) : 41.233 N/A N/A % Reduction in Area: -49.60% N/A N/A % Reduction in Volume: -49.60% N/A N/A Classification: Partial Thickness N/A N/A Exudate Amount: Medium N/A N/A Exudate Type: Serous N/A N/A Exudate Color: amber N/A N/A Wound Margin: Indistinct, nonvisible N/A N/A Steven Meza, Steven Meza ([id number]) Granulation Amount: Large (67-100%) N/A N/A Granulation Quality: Pink N/A N/A Necrotic Amount: Small (1-33%) N/A N/A Exposed Structures: Fat Layer (Subcutaneous N/A  N/A Tissue) Exposed: Yes Fascia: No Tendon: No Muscle: No Joint: No Bone: No Epithelialization: Medium (34-66%) N/A N/A Treatment Notes Electronic Signature(s) Signed: 06/18/2019 4:18:43 PM By: Army Melia Entered By: Army Melia on 06/18/2019 14:32:09 Steven Meza ([id number]) -------------------------------------------------------------------------------- Multi-Disciplinary Care Plan Details Patient Name: Steven Meza Date of Service: 06/18/2019 1:45 PM Medical Record Number: [id number] Patient Account Number: [id number] Date of Birth/Sex: 03/07/1931 (86 y.o. M) Treating RN: Army Melia Primary Care Quinnlyn Hearns: Fulton Reek Other Clinician: Referring Graysin Luczynski: Fulton Reek Treating Alvin Rubano/Extender: Melburn Hake, HOYT Weeks in Treatment: 11 Active Inactive Abuse / Safety / Falls / Self Care Management Nursing Diagnoses: Potential for falls Goals: Patient will remain injury free related to falls Date Initiated: 04/24/2019 Target Resolution Date: 07/14/2019 Goal Status: Active Interventions: Assess fall risk on admission and as needed Notes: Venous Leg Ulcer Nursing Diagnoses: Actual venous Insuffiency (use after diagnosis is confirmed) Goals: Patient will maintain optimal edema control Date Initiated: 04/24/2019 Target Resolution Date: 07/14/2019 Goal Status: Active Interventions: Compression as ordered Notes: Electronic Signature(s) Signed: 06/18/2019 4:18:43 PM By: Army Melia Entered By: Army Melia on 06/18/2019 14:31:58 Steven Meza ([id number]) -------------------------------------------------------------------------------- Pain Assessment Details Patient Name: Steven Meza Date of Service: 06/18/2019 1:45 PM Medical Record Number: [id number] Patient Account Number: [id number] Date of Birth/Sex: 07/07/1930 (86 y.o. M) Treating RN: Cornell Barman Primary Care Sankalp Ferrell: Fulton Reek Other Clinician: Referring Hubert Raatz: Fulton Reek Treating Lash Matulich/Extender: Melburn Hake, HOYT Weeks in Treatment: 11 Active Problems Location of Pain Severity and Description of Pain Patient Has Paino No Site Locations Pain Management and Medication Current Pain Management: Electronic Signature(s) Signed: 06/18/2019 4:18:59 PM By: Lorine Bears RCP, RRT, CHT Signed: 06/18/2019 5:08:08 PM By: Gretta Cool, BSN, RN, CWS, Kim RN, BSN Entered By: Lorine Bears on 06/18/2019 14:00:26 Steven Meza ([id number]) -------------------------------------------------------------------------------- Patient/Caregiver Education Details Patient Name: Steven Meza Date of Service: 06/18/2019 1:45 PM Medical Record Number: [id number] Patient Account Number: [id number] Date of Birth/Gender: [birth year] (86 y.o. M) Treating RN: Army Melia Primary Care Physician: Fulton Reek Other Clinician: Referring Physician: Fulton Reek Treating Physician/Extender: Sharalyn Ink in Treatment: 11 Education Assessment Education Provided To: Patient Education Topics Provided Wound/Skin Impairment: Handouts: Caring for Your Ulcer Methods: Demonstration, Explain/Verbal Responses: State content correctly Electronic Signature(s) Signed: 06/18/2019 4:18:43 PM By: Army Melia Entered By: Army Melia on 06/18/2019 14:38:39 Steven Meza ([id number]) -------------------------------------------------------------------------------- Wound Assessment Details Patient Name: Steven Nephew  A. Date of Service: 06/18/2019 1:45 PM Medical Record Number: [id number] Patient Account Number: [id number] Date of Birth/Sex: Apr 01, 1931 (86 y.o. M) Treating RN: Montey Hora Primary Care Britta Louth: Fulton Reek Other Clinician: Referring Alaa Mullally: Fulton Reek Treating Miriam Kestler/Extender: Melburn Hake, HOYT Weeks in Treatment: 11 Wound Status Wound Number: 6 Primary Venous Leg Ulcer Etiology: Wound Location: Left Lower Leg -  Circumferential Wound Status: Open Wounding Event: Gradually Appeared Comorbid Lymphedema, Coronary Artery Disease, Date Acquired: 03/02/2019 History: Hypertension Weeks Of Treatment: 11 Clustered Wound: No Photos Wound Measurements Length: (cm) 15 Width: (cm) 35 Depth: (cm) 0.1 Area: (cm) 412.334 Volume: (cm) 41.233 % Reduction in Area: -49.6% % Reduction in Volume: -49.6% Epithelialization: Medium (34-66%) Tunneling: No Undermining: No Wound Description Classification: Partial Thickness Foul Odor Wound Margin: Indistinct, nonvisible Slough/Fi Exudate Amount: Medium Exudate Type: Serous Exudate Color: amber After Cleansing: No brino Yes Wound Bed Granulation Amount: Large (67-100%) Exposed Structure Granulation Quality: Pink Fascia Exposed: No Necrotic Amount: Small (1-33%) Fat Layer (Subcutaneous Tissue) Exposed: Yes Necrotic Quality: Adherent Slough Tendon Exposed: No Muscle Exposed: No Joint Exposed: No Bone Exposed: No Treatment Notes Steven Meza, Steven Meza ([id number]) Wound #6 (Left, Circumferential Lower Leg) Notes Scell, abd, 3 Layer left leg Electronic Signature(s) Signed: 06/18/2019 4:29:13 PM By: Montey Hora Entered By: Montey Hora on 06/18/2019 14:14:18 Steven Meza ([id number]) -------------------------------------------------------------------------------- [place] Details Patient Name: Steven Meza Date of Service: 06/18/2019 1:45 PM Medical Record Number: [id number] Patient Account Number: [id number] Date of Birth/Sex: 05/02/1931 (86 y.o. M) Treating RN: Cornell Barman Primary Care Leeana Creer: Fulton Reek Other Clinician: Referring Harvir Patry: Fulton Reek Treating Kamron Vanwyhe/Extender: Melburn Hake, HOYT Weeks in Treatment: 11 Vital Signs Time Taken: 14:00 Temperature (F): 98.9 Height (in): 65 Pulse (bpm): 93 Weight (lbs): 273 Respiratory Rate (breaths/min): 18 Body Mass Index (BMI): 45.4 Blood Pressure (mmHg): 115/42 Reference  Range: 80 - 120 mg / dl Electronic Signature(s) Signed: 06/18/2019 4:18:59 PM By: Lorine Bears RCP, RRT, CHT Entered By: Lorine Bears on 06/18/2019 14:04:05

## 2019-06-18 NOTE — Progress Notes (Addendum)
AVERI, RUFFOLO ([id number]) Visit Report for 06/18/2019 Chief Complaint Document Details Patient Name: Steven Meza, Steven Meza Date of Service: 06/18/2019 1:45 PM Medical Record Number: [id number] Patient Account Number: [id number] Date of Birth/Sex: 1931/02/09 (86 y.o. M) Treating RN: Cornell Barman Primary Care Provider: Fulton Reek Other Clinician: Referring Provider: Fulton Reek Treating Provider/Extender: Melburn Hake, Bharath Bernstein Weeks in Treatment: 11 Information Obtained from: Patient Chief Complaint Left LE Ulcer Electronic Signature(s) Signed: 06/18/2019 2:26:31 PM By: Worthy Keeler PA-C Entered By: Worthy Keeler on 06/18/2019 14:26:30 Steven Meza ([id number]) -------------------------------------------------------------------------------- HPI Details Patient Name: Steven Meza Date of Service: 06/18/2019 1:45 PM Medical Record Number: [id number] Patient Account Number: [id number] Date of Birth/Sex: [birth year] (86 y.o. M) Treating RN: Cornell Barman Primary Care Provider: Fulton Reek Other Clinician: Referring Provider: Fulton Reek Treating Provider/Extender: Melburn Hake, Ishaq Maffei Weeks in Treatment: 11 History of Present Illness HPI Description: ADMISSION 05/17/2018 Mr. Steven Meza is an 86 year old man who is been treated at the lymphedema clinic for a long period with lymphedema wraps for bilateral lower extremity lymphedema. About 6 months ago they noted small open areas on his left lateral calf just above the ankle. These were open. They are apparently putting some form of ointment on this. They have been referred here for our review of this. The patient has a long history of lymphedema and venous stasis. It says in his records that he has recurrent cellulitis although his wife denies this but she states that the lymphedema may have started with cellulitis several years ago. Also in his record there is a history of bladder cancer which they seem to know very little about  however he did not have any radiation to the pelvis or anything that could have contributed to lymphedema that they are aware of. There is no prior wound history. The patient has 2 small punched out areas on the left lateral calf that have adherent debris on the surface. Past medical history; lymphedema, hypertension, cellulitis, hearing loss, morbid obesity, osteoarthritis, venous stasis, bladder CA, diverticulosis. ABIs in our clinic were 0.36 on the right and 0.57 on the left 05/24/2018; corrected age on this patient is 59 versus what I said last week. He has been for his arterial studies which predictably are not very good. On the right his ABI is 0.65. Monophasic waveforms noted at the right ankle including the posterior tibial and dorsalis pedis. He has triphasic waveforms of the common femoral and profunda femoris triphasic proximal SFA with monophasic distal FSA and popliteal artery. Monophasic tibial waveforms. On the left his ABI is 0.58 monophasic waveforms at the left ankle. Duplex of the left lower extremity demonstrated atherosclerotic change with monophasic waveforms throughout the left lower extremity. There was some concern about left iliac disease and potentially femoral-popliteal and tibial disease. The patient does not describe claudication although according to his wife he over emphasizes his activity. Patient states he is limited by pain in both knees His wounds are on the left lateral calf. 2 small areas with necrotic debris. We have been using silver collagen and light Ace wraps 05/31/2018; wounds on the left lateral calf in the setting of very significant lymphedema and probably PAD. We have been using silver collagen. The base of the small wound looks reasonably improved. I sent him to see Dr. Fletcher Anon however I see now he has an appointment with Dr. Alvester Chou on February 12 2/5; left lateral calf wound looks the same. His wife is doing a good job with her lymphedema wraps and  maintaining that the edema. He most likely has PAD and is due to see Dr. Gwenlyn Found of infectious disease on February 12 2/19; left lateral calf wounds look about the same. This looks like wound secondary to chronic venous insufficiency with lymphedema however the patient has very poor arterial studies. We referred him to Dr. Gwenlyn Found. Dr. Gwenlyn Found did not feel he needed to do anything from an arterial point of view. He did not address the question about the aggressiveness of compression. After some thoughts about this I elected to go ahead and put him in 3 layer compression which after all would be less compression then the lymphedema clinic was putting on this. I am hopeful that this should be enough to get some closure of the small wounds 2/26; left lateral calf wound letter this week. He tolerated the 3 layer compression well furthermore he is sleeping in a hospital bed which helps keep his legs up at night. The wound looks better measuring smaller especially in width 3/3; left lateral calf wound about the same size. The 3 layer compression has really helped get the edema down in his leg however. Using silver collagen 3/11; left lateral calf wound better looking wound surface but about the same size. We have been using 3 layer compression which is really helped get the edema down in the left leg. Even after Dr. Kennon Holter assessment I am reluctant to go to 4 layer compression. He is tolerating 3 layer compression well. We have been using silver collagen RAESHAWN, Meza ([id number]) 3/18-Patient returns for his left lateral calf wound which overall is looking better, slightly increased in size and dimensions and area, he is tolerating the 3 layer compression, has been dressed with silver collagen which we will continue 3/25oleft lateral calf wound much better looking. Size is smaller. He has been using silver collagen 4/1; gradually getting smaller in size. Using silver collagen with 3 layer compression. I  think the patient has some degree of PAD. He saw Dr. Gwenlyn Found in consultation, he did not specifically comment on whether he could tolerate 4 layer compression 4/8; the wound is slightly smaller in depth. We are using 3 layer compression with silver collagen. I had him seen for arterial insufficiency however his ABI was 0.57 in the left leg. Dr. Gwenlyn Found really did not seem to middle on the degree of compression he could tolerate 4/15; the wound is slightly smaller and slightly less deep. We have been using 3 layer compression with silver collagen. There is not an option for 4-layer compression here. 4/22; the patient is making decent progress on his wound on the left lateral calf however he arrives in with a superficial wound on the right anterior tibial area. Were not really sure how this happened. He had been using a stocking on the right leg. He has been using silver collagen on the wounds Amazing to me he is he appears to have compression pumps at home. I am not really sure I knew this before. In any case he has not been using them 4/29; left lateral calf wound continues to get gradually smaller. This looks like it is on its way to closure. oThe area on the right anterior tibial area is about the same in terms of size superficial without any depth. This was probably wrap injury oHe tells me that he has been using his compression pumps once a day for 1 hour without any pain 5/6; not as good today. Left lateral calf wound is actually larger and  he does not have quite as good edema control. The area on the right anterior tibia is about the same still superficial. He states he is using his compression pumps once a day. We are using 3 layer compression on the left and Curlex Coban on the right 5/13; the area on the right is very close to being healed. The area on the left still has some depth but has a healthy wound surface. He has excellent edema control bilaterally. Using Hydrofera Blue 5/20-Patient  returns in 1 week after being in 3 layer compression on the left, the left lateral calf ulcer is stable with some slough, the right anterior tibial ulcer is healed we have been using Kerlix Coban on that leg 5/27; Hydrofera Blue and 3 layer compression. Still some slough in the wound area and some nonviable edges around the wound. 6/3; this patient has lymphedema and chronic venous insufficiency. He had difficult to close wound on his left lateral calf. This is closed today. 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 Finally he has known PAD but he is managed to tolerate the compression we reporting on him and also using his compression pumps that he already had at home twice a day 6/10; we discharge this patient last week having finally close the small area in the left lateral calf. Apparently by Friday of last week this had opened and was draining. He is therefore back in clinic. His wife who is present also was concerned about an area on the right anterior tibia."o 6/17; the patient does not have an open area on the right leg. On the left he has a small circular area anteriorly and the same area laterally that he had last time. However both of these appear to be improved he has his Farrow wrap 30/40. It turns out that he is only using his external compression pumps once a day. I tried to get him up to twice a day today 6/24; the patient comes in with his external compression garment on the right leg/Farrow wrap. As far as we know no open wounds here. He has 2 areas that are smaller one on the left anterior pretibial and one on the left lateral calf which is the site of his initial wounds. I have successfully caught him into his external compression pumps twice a day 7/1; still using his external compression garment on the right leg. As far as we know there are no open wounds here. The area on the left lateral calf which was the site of his initial wound has  closed over. The area on the left anterior pretibial that we identified last week is still open. 7/8; as far as we know he has no open wounds on the right leg. He has his external compression garment on here. The original wound on the left lateral calf has closed over. He developed an area on the left anterior pretibial 2 weeks ago and seems to have 2 smaller areas on the proximal and distal lateral calf. We do not have his good edema control this week as I am used to seeing 7/15; the patient has still a linear area on the left anterior tibia. The area on the left lateral leg looked somewhat inflamed today but no cellulitis. He did have what looked to be a small pustule that I cultured for CandS although I did not give him empiric antibiotics. 7/22; small area on the left anterior tibia. The area on the left  lateral leg still looks inflamed. The pustule that I cultured last week grew Enterobacter and Pseudomonas. I prescribed Cipro they still have not picked that up. In any case the area actually looks fairly satisfactory. He has decent edema control using his compression pumps twice a day 7/29; the area on the left lateral leg looks a lot better than last week. Nothing is open here. He has an area just medial to the mid anterior tibia that is still open that is only open wound today. 8/5-Patient returns at 1 week for the left leg ulcer, which is actually looking better, he is also complaining of left plantar foot pain below the great toe. He does have a hematoma here ABDURAHMAN, FITZER ([id number]) 8/12-Patient returns at 1 week, the left leg open area looks about the same if not slightly better, the left plantar hematoma under the great toe is about the same, patient apparently has not been resting his foot on anything for too long period of time and does not wear the [employer]kenstock shoes unless he walks outdoors which he does not do often 8/19; small wound on the left anterior lower leg this is  just about closed there is nothing on the left lateral leg. He is using his compression pumps He arrives in clinic today with a black eschar over the right first plantar metatarsal head. This was apparently a bruise that was identified by her nurses last week. He wears crocs on his feet. He also may rub these on a footboard at home 8/26; there is nothing open on the left lateral lower leg he has a small area just medial to the tibia. He is going to see his podiatrist Dr. Milinda Pointer about the area on the left plantar first metatarsal head. He is using his external compression pumps. He has a juxta lite on the right leg 9/2; there is nothing open on either side of her left lower leg. He went to see podiatry about the area on the left first metatarsal head. This was apparently debrided. He was given mupirocin and oral antibiotics. I do not believe he is offloading this at all. Readmission: 04/02/2019 upon evaluation today patient actually appears for reevaluation here in our clinic secondary to issues that he is having with his left lower extremity. He has been tolerating the dressing changes without complication. Fortunately there is no signs of active infection at this time. No fevers, chills, nausea, vomiting, or diarrhea. He has been in the hospital since we last saw him and then subsequently transferred back to [facility]. He tells me that they have been changing his dressing once a day and that he has had a significant amount of pain secondary to undergoing these dressing changes. Fortunately there is no evidence of active infection at this point which is good news. No fevers, chills, nausea, vomiting, or diarrhea. With that being said he wonders if we can go back to doing what we were doing before which was when he actually had the dressing changes performed once a week here in the clinic he tells me that he did very well with that. At that point he tolerated a 3 layer compression wrap which  was approved by vascular, Dr. Tyrell Antonio office, to be used previous although 4-layer compression wrap is probably too much for him. 04/16/2019 on evaluation today patient appears to be doing decently well with regard to his lower extremity although he has had a lot of drainage from his leg. A lot of this I  think is just secondary to the lymphedema with that being said he does have some odor as well which may again just be due to more of the excessive moisture although I think that he may also have something that is a result of bacteria again he has somewhat of a Pseudomonas-like smell to the drainage and fortunately though he is not showing any signs of severe cellulitis he is still experiencing quite a bit of drainage and the discoloration is somewhat of a yellow/green in color which is consistent with Pseudomonas. If he can tolerated I think he may do well with Cipro. 04/24/2019 on evaluation today patient appears to be doing well with regard to his wound in the gluteal region which is healing quite well. That is excellent news. With that being said he is also doing better with regard to his left lower extremity overall I feel like things are definitely showing signs of improvement. 12/28-Patient returns with regards to his gluteal wound that is improving, Left lower leg wounds with significant edema in the lower legs that appear to be persistent. Patient is in compression and has lymphedema pumps at home but apparently he does not use consistently 05/07/2019 Upon inspection patient's wound bed actually showed signs on evaluation today patient's wound currently showed signs of improvement. There is still a lot of maceration but it does look like that there is been some of this maceration occurring as a result of drainage in particular and some as a result of the fact that they have been putting bag balm on the wound area as well as the leg in general. I think this is applying too much moisture and  not doing him any good. I discussed with the patient and his wife today that I think that practice needs to be adjusted I would recommend using a different type of lotion in order to help with moisture around the wound but I would not recommend anything directly on the wound itself. 05/14/2019 on evaluation today patient actually appears to be doing much better in regard to his lower extremity. They have not been using any of the bag balm on the area I think this has made a tremendous improvement overall in his wounds. He has a lot of new skin growth and though there still are a lot of open wounds scattered throughout the circumferential lower leg this overall seems to be doing much better to me. 05/21/2019 upon evaluation today patient appears to be doing well in regard to his lower extremity although he is having unfortunately some blue-green drainage noted at this point. The does have me concerned for a reinitiation of the Pseudomonas infection that we previously treated in December. The last time I gave him a prescription was April 16, 2019. That was a little over a month ago. Nonetheless he does have more green drainage noted today and I think this is indicative of potentially a revving up infection. Obviously we do not want to get to that point. IRFAN, KOPPLIN ([id number]) 05/28/2019 on evaluation today patient appears to be doing better with regard to his lower extremity at this point. Overall I am seeing signs of improvement which is good news. Fortunately there is no evidence of active infection at this time. I do believe that the Cipro has been beneficial for him that was prescribed last week for 14 days he is halfway through that and already appears to be doing much better. This is excellent news. 06/04/2019 upon evaluation today I do believe  that the patient is doing better with regard to his lower extremity. I feel like that he has much less in the way of open wounds and drainage and  much in provement in regard to the overall weeping and even his swelling. There is no signs of erythema no obvious evidence of active infection all of which is great news. No fevers, chills, nausea, vomiting, or diarrhea. 06/11/2019 on evaluation today patient appears to be doing better with regard to his lower extremity on the left. He has been tolerating dressing changes without complication. Fortunately there is no signs of active infection at this time. No fevers, chills, nausea, vomiting, or diarrhea. 06/18/2019 upon evaluation today patient appears to be doing slightly worse compared to last week. They did call me on Friday due to the fact that he had green discharge and both his wife as well as his home health nurse were concerned about what they were seeing. Subsequently I did actually send in a prescription for Cipro for 14 days for him and according to his wife this seems to be doing better based on what she is seeing today compared to Friday. This is great news. Home health is still coming out to see him on Wednesdays and Fridays as well. Electronic Signature(s) Signed: 06/18/2019 2:45:29 PM By: Worthy Keeler PA-C Entered By: Worthy Keeler on 06/18/2019 14:45:29 Steven Meza ([id number]) -------------------------------------------------------------------------------- Physical Exam Details Patient Name: Steven Meza Date of Service: 06/18/2019 1:45 PM Medical Record Number: [id number] Patient Account Number: [id number] Date of Birth/Sex: [birth year] (86 y.o. M) Treating RN: Cornell Barman Primary Care Provider: Fulton Reek Other Clinician: Referring Provider: Fulton Reek Treating Provider/Extender: Melburn Hake, Dan Scearce Weeks in Treatment: 59 Constitutional Well-nourished and well-hydrated in no acute distress. Respiratory normal breathing without difficulty. Psychiatric this patient is able to make decisions and demonstrates good insight into disease process. Alert and  Oriented x 3. pleasant and cooperative. Notes His wound beds currently in general on the left lower extremity appear to be doing decently well he does have a lot of drainage but again I think things seem to be coming back down based on what I am being told compared to what was going on Friday. I am glad we sent in the antibiotic it seems to be helping him I do not see a need for culture at this point. Electronic Signature(s) Signed: 06/18/2019 2:45:51 PM By: Worthy Keeler PA-C Entered By: Worthy Keeler on 06/18/2019 14:45:50 Steven Meza ([id number]) -------------------------------------------------------------------------------- Physician Orders Details Patient Name: Steven Meza Date of Service: 06/18/2019 1:45 PM Medical Record Number: [id number] Patient Account Number: [id number] Date of Birth/Sex: 04-20-1931 (86 y.o. M) Treating RN: Army Melia Primary Care Provider: Fulton Reek Other Clinician: Referring Provider: Fulton Reek Treating Provider/Extender: Melburn Hake, Maley Venezia Weeks in Treatment: 63 Verbal / Phone Orders: No Diagnosis Coding ICD-10 Coding Meza Description I89.0 Lymphedema, not elsewhere classified I87.2 Venous insufficiency (chronic) (peripheral) L97.822 Non-pressure chronic ulcer of other part of left lower leg with fat layer exposed I10 Essential (primary) hypertension I25.10 Atherosclerotic heart disease of native coronary artery without angina pectoris L84 Corns and callosities Wound Cleansing Wound #6 Left,Circumferential Lower Leg o May shower with protection. o No tub bath. Primary Wound Dressing Wound #6 Left,Circumferential Lower Leg o Silver Alginate Secondary Dressing Wound #6 Left,Circumferential Lower Leg o ABD pad Dressing Change Frequency Wound #6 Left,Circumferential Lower Leg o Change Dressing Monday, Wednesday, Friday - Home Health to change dressings on Wednesdays and Fridays Follow-up Appointments  Wound #6  Left,Circumferential Lower Leg o Return Appointment in 1 week. o Nurse Visit as needed Edema Control Wound #6 Left,Circumferential Lower Leg o 3 Layer Compression System - Left Lower Extremity o Compression Pump: Use compression pump on left lower extremity for 60 minutes, twice daily. o Compression Pump: Use compression pump on right lower extremity for 60 minutes, twice daily. Off-Loading Wound #6 Left,Circumferential Lower Leg o Turn and reposition every 2 hours MILEN, TREECE ([id number]) Home Health Wound #6 Left,Circumferential Lower Leg o Sledge Nurse may visit PRN to address patientos wound care needs. o FACE TO FACE ENCOUNTER: MEDICARE and MEDICAID PATIENTS: I certify that this patient is under my care and that I had a face-to-face encounter that meets the physician face-to-face encounter requirements with this patient on this date. The encounter with the patient was in whole or in part for the following MEDICAL CONDITION: (primary reason for [place]) MEDICAL NECESSITY: I certify, that based on my findings, NURSING services are a medically necessary home health service. HOME BOUND STATUS: I certify that my clinical findings support that this patient is homebound (i.e., Due to illness or injury, pt requires aid of supportive devices such as crutches, cane, wheelchairs, walkers, the use of special transportation or the assistance of another person to leave their place of residence. There is a normal inability to leave the home and doing so requires considerable and taxing effort. Other absences are for medical reasons / religious services and are infrequent or of short duration when for other reasons). o If current dressing causes regression in wound condition, may D/C ordered dressing product/s and apply Normal Saline Moist Dressing daily until next [place] / Other MD appointment. [place] of regression in wound condition at [phone]. o Please direct any NON-WOUND related issues/requests for orders to patient's Primary Care Physician Electronic Signature(s) Signed: 06/18/2019 4:18:43 PM By: Army Melia Signed: 06/18/2019 4:43:11 PM By: Worthy Keeler PA-C Entered By: Army Melia on 06/18/2019 14:37:28 Steven Meza ([id number]) -------------------------------------------------------------------------------- Problem List Details Patient Name: Steven Meza Date of Service: 06/18/2019 1:45 PM Medical Record Number: [id number] Patient Account Number: [id number] Date of Birth/Sex: 28-Oct-1930 (86 y.o. 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/02/2019 No Yes fat layer exposed I10 Essential (primary) hypertension 04/02/2019 No Yes I25.10 Atherosclerotic heart disease of native coronary artery 04/02/2019 No Yes without angina pectoris L84 Corns and callosities 05/09/2019 No Yes Inactive Problems Resolved Problems Electronic Signature(s) Signed: 06/18/2019 2:26:24 PM By: Worthy Keeler PA-C Entered By: Worthy Keeler on 06/18/2019 14:26:24 Steven Meza ([id number]) -------------------------------------------------------------------------------- Progress Note Details Patient Name: Steven Meza Date of Service: 06/18/2019 1:45 PM Medical Record Number: [id number] Patient Account Number: [id number] Date of Birth/Sex: [birth year] (86 y.o. M) Treating RN: Cornell Barman Primary Care Provider: Fulton Reek Other  Clinician: Referring Provider: Fulton Reek Treating Provider/Extender: Melburn Hake, Shawnda Mauney Weeks in Treatment: 11 Subjective Chief Complaint Information obtained from Patient Left LE Ulcer History of Present Illness (HPI) ADMISSION 05/17/2018 Mr. Bratz is an 86 year old man who is been treated at the lymphedema clinic for a long period  with lymphedema wraps for bilateral lower extremity lymphedema. About 6 months ago they noted small open areas on his left lateral calf just above the ankle. These were open. They are apparently putting some form of ointment on this. They have been referred here for our review of this. The patient has a long history of lymphedema and venous stasis. It says in his records that he has recurrent cellulitis although his wife denies this but she states that the lymphedema may have started with cellulitis several years ago. Also in his record there is a history of bladder cancer which they seem to know very little about however he did not have any radiation to the pelvis or anything that could have contributed to lymphedema that they are aware of. There is no prior wound history. The patient has 2 small punched out areas on the left lateral calf that have adherent debris on the surface. Past medical history; lymphedema, hypertension, cellulitis, hearing loss, morbid obesity, osteoarthritis, venous stasis, bladder CA, diverticulosis. ABIs in our clinic were 0.36 on the right and 0.57 on the left 05/24/2018; corrected age on this patient is 70 versus what I said last week. He has been for his arterial studies which predictably are not very good. On the right his ABI is 0.65. Monophasic waveforms noted at the right ankle including the posterior tibial and dorsalis pedis. He has triphasic waveforms of the common femoral and profunda femoris triphasic proximal SFA with monophasic distal FSA and popliteal artery. Monophasic tibial waveforms. On the left his ABI is  0.58 monophasic waveforms at the left ankle. Duplex of the left lower extremity demonstrated atherosclerotic change with monophasic waveforms throughout the left lower extremity. There was some concern about left iliac disease and potentially femoral-popliteal and tibial disease. The patient does not describe claudication although according to his wife he over emphasizes his activity. Patient states he is limited by pain in both knees His wounds are on the left lateral calf. 2 small areas with necrotic debris. We have been using silver collagen and light Ace wraps 05/31/2018; wounds on the left lateral calf in the setting of very significant lymphedema and probably PAD. We have been using silver collagen. The base of the small wound looks reasonably improved. I sent him to see Dr. Fletcher Anon however I see now he has an appointment with Dr. Alvester Chou on February 12 2/5; left lateral calf wound looks the same. His wife is doing a good job with her lymphedema wraps and maintaining that the edema. He most likely has PAD and is due to see Dr. Gwenlyn Found of infectious disease on February 12 2/19; left lateral calf wounds look about the same. This looks like wound secondary to chronic venous insufficiency with lymphedema however the patient has very poor arterial studies. We referred him to Dr. Gwenlyn Found. Dr. Gwenlyn Found did not feel he needed to do anything from an arterial point of view. He did not address the question about the aggressiveness of compression. After some thoughts about this I elected to go ahead and put him in 3 layer compression which after all would be less compression then the lymphedema clinic was putting on this. I am hopeful that this should be enough to get some closure of the small wounds 2/26; left lateral calf wound letter this week. He tolerated the 3 layer compression well furthermore he is sleeping in a hospital LOYALTY, FADLER A. ([id number]) bed which helps keep his legs up at night. The wound  looks better measuring smaller  especially in width 3/3; left lateral calf wound about the same size. The 3 layer compression has really helped get the edema down in his leg however. Using silver collagen 3/11; left lateral calf wound better looking wound surface but about the same size. We have been using 3 layer compression which is really helped get the edema down in the left leg. Even after Dr. Kennon Holter assessment I am reluctant to go to 4 layer compression. He is tolerating 3 layer compression well. We have been using silver collagen 3/18-Patient returns for his left lateral calf wound which overall is looking better, slightly increased in size and dimensions and area, he is tolerating the 3 layer compression, has been dressed with silver collagen which we will continue 3/25 left lateral calf wound much better looking. Size is smaller. He has been using silver collagen 4/1; gradually getting smaller in size. Using silver collagen with 3 layer compression. I think the patient has some degree of PAD. He saw Dr. Gwenlyn Found in consultation, he did not specifically comment on whether he could tolerate 4 layer compression 4/8; the wound is slightly smaller in depth. We are using 3 layer compression with silver collagen. I had him seen for arterial insufficiency however his ABI was 0.57 in the left leg. Dr. Gwenlyn Found really did not seem to middle on the degree of compression he could tolerate 4/15; the wound is slightly smaller and slightly less deep. We have been using 3 layer compression with silver collagen. There is not an option for 4-layer compression here. 4/22; the patient is making decent progress on his wound on the left lateral calf however he arrives in with a superficial wound on the right anterior tibial area. Were not really sure how this happened. He had been using a stocking on the right leg. He has been using silver collagen on the wounds Amazing to me he is he appears to have compression  pumps at home. I am not really sure I knew this before. In any case he has not been using them 4/29; left lateral calf wound continues to get gradually smaller. This looks like it is on its way to closure. The area on the right anterior tibial area is about the same in terms of size superficial without any depth. This was probably wrap injury He tells me that he has been using his compression pumps once a day for 1 hour without any pain 5/6; not as good today. Left lateral calf wound is actually larger and he does not have quite as good edema control. The area on the right anterior tibia is about the same still superficial. He states he is using his compression pumps once a day. We are using 3 layer compression on the left and Curlex Coban on the right 5/13; the area on the right is very close to being healed. The area on the left still has some depth but has a healthy wound surface. He has excellent edema control bilaterally. Using Hydrofera Blue 5/20-Patient returns in 1 week after being in 3 layer compression on the left, the left lateral calf ulcer is stable with some slough, the right anterior tibial ulcer is healed we have been using Kerlix Coban on that leg 5/27; Hydrofera Blue and 3 layer compression. Still some slough in the wound area and some nonviable edges around the wound. 6/3; this patient has lymphedema and chronic venous insufficiency. He had difficult to close wound on his left lateral calf. This is closed today. He has  also chronic stasis dermatitis and xerotic skin in his lower extremities. Finally he has known PAD but he is managed to tolerate the compression we reporting on him and also using his compression pumps that he already had at home twice a day 6/10; we discharge this patient last week having finally close the small area in the left lateral calf. Apparently by Friday of last week this had opened and was draining. He is therefore back in clinic. His wife who is  present also was concerned about an area on the right anterior tibia."o 6/17; the patient does not have an open area on the right leg. On the left he has a small circular area anteriorly and the same area laterally that he had last time. However both of these appear to be improved he has his [place] wrap 30/40. It turns out that he is only using his external compression pumps once a day. I tried to get him up to twice a day today 6/24; the patient comes in with his external compression garment on the right leg/Farrow wrap. As far as we know no open wounds here. He has 2 areas that are smaller one on the left anterior pretibial and one on the left lateral calf which is the site of his initial wounds. I have successfully caught him into his external compression pumps twice a day 7/1; still using his external compression garment on the right leg. As far as we know there are no open wounds here. The area on the left lateral calf which was the site of his initial wound has closed over. The area on the left anterior pretibial that we identified last week is still open. 7/8; as far as we know he has no open wounds on the right leg. He has his external compression garment on here. The original wound on the left lateral calf has closed over. He developed an area on the left anterior pretibial 2 weeks ago and seems to have 2 smaller areas on the proximal and distal lateral calf. We do not have his good edema control this week as I am used to seeing 7/15; the patient has still a linear area on the left anterior tibia. The area on the left lateral leg looked somewhat inflamed today but no cellulitis. He did have what looked to be a small pustule that I cultured for CandS although I did not give him empiric antibiotics. 7/22; small area on the left anterior tibia. The area on the left lateral leg still looks inflamed. The pustule that I cultured last AVARY, GOODE. ([id number]) week grew Enterobacter and  Pseudomonas. I prescribed Cipro they still have not picked that up. In any case the area actually looks fairly satisfactory. He has decent edema control using his compression pumps twice a day 7/29; the area on the left lateral leg looks a lot better than last week. Nothing is open here. He has an area just medial to the mid anterior tibia that is still open that is only open wound today. 8/5-Patient returns at 1 week for the left leg ulcer, which is actually looking better, he is also complaining of left plantar foot pain below the great toe. He does have a hematoma here 8/12-Patient returns at 1 week, the left leg open area looks about the same if not slightly better, the left plantar hematoma under the great toe is about the same, patient apparently has not been resting his foot on anything for too long period of time  and does not wear the [employer] shoes unless he walks outdoors which he does not do often 8/19; small wound on the left anterior lower leg this is just about closed there is nothing on the left lateral leg. He is using his compression pumps He arrives in clinic today with a black eschar over the right first plantar metatarsal head. This was apparently a bruise that was identified by her nurses last week. He wears crocs on his feet. He also may rub these on a footboard at home 8/26; there is nothing open on the left lateral lower leg he has a small area just medial to the tibia. He is going to see his podiatrist Dr. Milinda Pointer about the area on the left plantar first metatarsal head. He is using his external compression pumps. He has a juxta lite on the right leg 9/2; there is nothing open on either side of her left lower leg. He went to see podiatry about the area on the left first metatarsal head. This was apparently debrided. He was given mupirocin and oral antibiotics. I do not believe he is offloading this at all. Readmission: 04/02/2019 upon evaluation today patient actually  appears for reevaluation here in our clinic secondary to issues that he is having with his left lower extremity. He has been tolerating the dressing changes without complication. Fortunately there is no signs of active infection at this time. No fevers, chills, nausea, vomiting, or diarrhea. He has been in the hospital since we last saw him and then subsequently transferred back to [facility]. He tells me that they have been changing his dressing once a day and that he has had a significant amount of pain secondary to undergoing these dressing changes. Fortunately there is no evidence of active infection at this point which is good news. No fevers, chills, nausea, vomiting, or diarrhea. With that being said he wonders if we can go back to doing what we were doing before which was when he actually had the dressing changes performed once a week here in the clinic he tells me that he did very well with that. At that point he tolerated a 3 layer compression wrap which was approved by vascular, Dr. Tyrell Antonio office, to be used previous although 4-layer compression wrap is probably too much for him. 04/16/2019 on evaluation today patient appears to be doing decently well with regard to his lower extremity although he has had a lot of drainage from his leg. A lot of this I think is just secondary to the lymphedema with that being said he does have some odor as well which may again just be due to more of the excessive moisture although I think that he may also have something that is a result of bacteria again he has somewhat of a Pseudomonas-like smell to the drainage and fortunately though he is not showing any signs of severe cellulitis he is still experiencing quite a bit of drainage and the discoloration is somewhat of a yellow/green in color which is consistent with Pseudomonas. If he can tolerated I think he may do well with Cipro. 04/24/2019 on evaluation today patient appears to be doing well  with regard to his wound in the gluteal region which is healing quite well. That is excellent news. With that being said he is also doing better with regard to his left lower extremity overall I feel like things are definitely showing signs of improvement. 12/28-Patient returns with regards to his gluteal wound that is improving, Left  lower leg wounds with significant edema in the lower legs that appear to be persistent. Patient is in compression and has lymphedema pumps at home but apparently he does not use consistently 05/07/2019 Upon inspection patient's wound bed actually showed signs on evaluation today patient's wound currently showed signs of improvement. There is still a lot of maceration but it does look like that there is been some of this maceration occurring as a result of drainage in particular and some as a result of the fact that they have been putting bag balm on the wound area as well as the leg in general. I think this is applying too much moisture and not doing him any good. I discussed with the patient and his wife today that I think that practice needs to be adjusted I would recommend using a different type of lotion in order to help with moisture around the wound but I would not recommend anything directly on the wound itself. 05/14/2019 on evaluation today patient actually appears to be doing much better in regard to his lower extremity. They have not been using any of the bag balm on the area I think this has made a tremendous improvement overall in his wounds. He has a lot of new skin growth and though there still are a lot of open wounds scattered throughout the circumferential lower leg this overall seems to be doing much better to me. IASON, DANH ([id number]) 05/21/2019 upon evaluation today patient appears to be doing well in regard to his lower extremity although he is having unfortunately some blue-green drainage noted at this point. The does have me concerned for a  reinitiation of the Pseudomonas infection that we previously treated in December. The last time I gave him a prescription was April 16, 2019. That was a little over a month ago. Nonetheless he does have more green drainage noted today and I think this is indicative of potentially a revving up infection. Obviously we do not want to get to that point. 05/28/2019 on evaluation today patient appears to be doing better with regard to his lower extremity at this point. Overall I am seeing signs of improvement which is good news. Fortunately there is no evidence of active infection at this time. I do believe that the Cipro has been beneficial for him that was prescribed last week for 14 days he is halfway through that and already appears to be doing much better. This is excellent news. 06/04/2019 upon evaluation today I do believe that the patient is doing better with regard to his lower extremity. I feel like that he has much less in the way of open wounds and drainage and much in provement in regard to the overall weeping and even his swelling. There is no signs of erythema no obvious evidence of active infection all of which is great news. No fevers, chills, nausea, vomiting, or diarrhea. 06/11/2019 on evaluation today patient appears to be doing better with regard to his lower extremity on the left. He has been tolerating dressing changes without complication. Fortunately there is no signs of active infection at this time. No fevers, chills, nausea, vomiting, or diarrhea. 06/18/2019 upon evaluation today patient appears to be doing slightly worse compared to last week. They did call me on Friday due to the fact that he had green discharge and both his wife as well as his home health nurse were concerned about what they were seeing. Subsequently I did actually send in a prescription for Cipro for  14 days for him and according to his wife this seems to be doing better based on what she is seeing today  compared to Friday. This is great news. Home health is still coming out to see him on Wednesdays and Fridays as well. Objective Constitutional Well-nourished and well-hydrated in no acute distress. Vitals Time Taken: 2:00 PM, Height: 65 in, Weight: 273 lbs, BMI: 45.4, Temperature: 98.9 F, Pulse: 93 bpm, Respiratory Rate: 18 breaths/min, Blood Pressure: 115/42 mmHg. Respiratory normal breathing without difficulty. Psychiatric this patient is able to make decisions and demonstrates good insight into disease process. Alert and Oriented x 3. pleasant and cooperative. General Notes: His wound beds currently in general on the left lower extremity appear to be doing decently well he does have a lot of drainage but again I think things seem to be coming back down based on what I am being told compared to what was going on Friday. I am glad we sent in the antibiotic it seems to be helping him I do not see a need for culture at this point. Integumentary (Hair, Skin) Wound #6 status is Open. Original cause of wound was Gradually Appeared. The wound is located on the Left,Circumferential Lower Leg. The wound measures 15cm length x 35cm width x 0.1cm depth; 412.334cm^2 area and REINOLD, FOGLEMAN A. ([id number]) 41.233cm^3 volume. There is Fat Layer (Subcutaneous Tissue) Exposed exposed. There is no tunneling or undermining noted. There is a medium amount of serous drainage noted. The wound margin is indistinct and nonvisible. There is large (67-100%) pink granulation within the wound bed. There is a small (1-33%) amount of necrotic tissue within the wound bed including Adherent Slough. Assessment Active Problems ICD-10 Lymphedema, not elsewhere classified Venous insufficiency (chronic) (peripheral) Non-pressure chronic ulcer of other part of left lower leg with fat layer exposed Essential (primary) hypertension Atherosclerotic heart disease of native coronary artery without angina pectoris Corns and  callosities Procedures Wound #6 Pre-procedure diagnosis of Wound #6 is a Venous Leg Ulcer located on the Left,Circumferential Lower Leg . There was a Three Layer Compression Therapy Procedure by Army Melia, RN. Post procedure Diagnosis Wound #6: Same as Pre-Procedure Plan Wound Cleansing: Wound #6 Left,Circumferential Lower Leg: May shower with protection. No tub bath. Primary Wound Dressing: Wound #6 Left,Circumferential Lower Leg: Silver Alginate Secondary Dressing: Wound #6 Left,Circumferential Lower Leg: ABD pad Dressing Change Frequency: Wound #6 Left,Circumferential Lower Leg: Change Dressing Monday, Wednesday, Friday - Home Health to change dressings on Wednesdays and Fridays Follow-up Appointments: Wound #6 Left,Circumferential Lower Leg: Return Appointment in 1 week. Nurse Visit as needed Edema Control: Wound #6 Left,Circumferential Lower Leg: ZAIDON, HIGA ([id number]) 3 Layer Compression System - Left Lower Extremity Compression Pump: Use compression pump on left lower extremity for 60 minutes, twice daily. Compression Pump: Use compression pump on right lower extremity for 60 minutes, twice daily. Off-Loading: Wound #6 Left,Circumferential Lower Leg: Turn and reposition every 2 hours Home Health: Wound #6 Left,Circumferential Lower Leg: [place] Nurse may visit PRN to address patient s wound care needs. FACE TO FACE ENCOUNTER: MEDICARE and MEDICAID PATIENTS: I certify that this patient is under my care and that I had a face-to-face encounter that meets the physician face-to-face encounter requirements with this patient on this date. The encounter with the patient was in whole or in part for the following MEDICAL CONDITION: (primary reason for [place]) MEDICAL NECESSITY: I certify, that based on my findings, NURSING services are a medically necessary  home health service. HOME BOUND STATUS: I certify that my  clinical findings support that this patient is homebound (i.e., Due to illness or injury, pt requires aid of supportive devices such as crutches, cane, wheelchairs, walkers, the use of special transportation or the assistance of another person to leave their place of residence. There is a normal inability to leave the home and doing so requires considerable and taxing effort. Other absences are for medical reasons / religious services and are infrequent or of short duration when for other reasons). If current dressing causes regression in wound condition, may D/C ordered dressing product/s and apply Normal Saline Moist Dressing daily until next [place] / Other MD appointment. [place] of regression in wound condition at [phone]. Please direct any NON-WOUND related issues/requests for orders to patient's Primary Care Physician 1. I would recommend currently that we go ahead and continue with the Cipro for 14 days from when I prescribed this on Friday I still think that is the best measure for him hopefully will keep the infection under control and allow [place] to get this healed up for good in all. 2. I am also can recommend currently that we continue the silver alginate dressing followed by the 3 layer compression wrap on the left lower extremity. 3. I do recommend the patient elevate his legs is much as possible obviously the more he is able to elevate the better off he will be in my opinion. We will see patient back for reevaluation in 1 week here in the clinic. If anything worsens or changes patient will contact our office for additional recommendations. Electronic Signature(s) Signed: 06/18/2019 2:46:26 PM By: Worthy Keeler PA-C Entered By: Worthy Keeler on 06/18/2019 14:46:26 [place] [facility] [place] ([id number]) -------------------------------------------------------------------------------- SuperBill Details Patient Name: Steven Meza Date of Service:  06/18/2019 Medical Record Number: [id number] Patient Account Number: [id number] Date of Birth/Sex: November 13, 1930 (86 y.o. M) Treating RN: Army Melia Primary Care Provider: Fulton Reek Other Clinician: Referring Provider: Fulton Reek Treating Provider/Extender: Melburn Hake, Maya Scholer Weeks in Treatment: 11 Diagnosis Coding ICD-10 Codes Meza Description I89.0 Lymphedema, not elsewhere classified I87.2 Venous insufficiency (chronic) (peripheral) L97.822 Non-pressure chronic ulcer of other part of left lower leg with fat layer exposed [place] (primary) hypertension I25.10 Atherosclerotic heart disease of native coronary artery without angina pectoris L84 Corns and callosities Facility Procedures CPT4 Meza: [id number] Description: [facility] VISIT-LEV 3 EST PT Modifier: Quantity: 1 CPT4 Meza: [id number] Description: (Facility Use Only) [id number] - [place] LWR LT LEG Modifier: Quantity: 1 Physician Procedures CPT4 Meza Description: [id number] - WC PHYS LEVEL 4 - EST PT ICD-10 Diagnosis Description I89.0 Lymphedema, not elsewhere classified I87.2 Venous insufficiency (chronic) (peripheral) L97.822 Non-pressure chronic ulcer of other part of left lower leg  wit I10 Essential (primary) hypertension Modifier: h fat layer expos Quantity: 1 ed Electronic Signature(s) Signed: 06/18/2019 2:46:41 PM By: Worthy Keeler PA-C Entered By: Worthy Keeler on 06/18/2019 14:46:41

## 2019-06-25 ENCOUNTER — Encounter: Payer: Medicare Other | Admitting: Physician Assistant

## 2019-06-25 ENCOUNTER — Other Ambulatory Visit: Payer: Self-pay

## 2019-06-25 DIAGNOSIS — L97822 Non-pressure chronic ulcer of other part of left lower leg with fat layer exposed: Secondary | ICD-10-CM | POA: Diagnosis not present

## 2019-06-25 NOTE — Progress Notes (Addendum)
Steven Meza, Steven Meza ([id number]) Visit Report for 06/25/2019 Chief Complaint Document Details Patient Name: Steven Meza, Steven Meza Date of Service: 06/25/2019 1:45 PM Medical Record Number: [id number] Patient Account Number: [id number] Date of Birth/Sex: 02-18-1931 (86 y.o. M) Treating RN: Cornell Barman Primary Care Provider: Fulton Reek Other Clinician: Referring Provider: Fulton Reek Treating Provider/Extender: Melburn Hake, Bianca Vester Weeks in Treatment: 12 Information Obtained from: Patient Chief Complaint Left LE Ulcer Electronic Signature(s) Signed: 06/25/2019 2:24:12 PM By: Worthy Keeler PA-C Entered By: Worthy Keeler on 06/25/2019 14:24:12 Steven Meza ([id number]) -------------------------------------------------------------------------------- HPI Details Patient Name: Steven Meza Date of Service: 06/25/2019 1:45 PM Medical Record Number: [id number] Patient Account Number: [id number] Date of Birth/Sex: Feb 13, 1931 (86 y.o. M) Treating RN: Cornell Barman Primary Care Provider: Fulton Reek Other Clinician: Referring Provider: Fulton Reek Treating Provider/Extender: Melburn Hake, Edmond Ginsberg Weeks in Treatment: 12 History of Present Illness HPI Description: ADMISSION 05/17/2018 Steven Meza is an 86 year old man who is been treated at the lymphedema clinic for a long period with lymphedema wraps for bilateral lower extremity lymphedema. About 6 months ago they noted small open areas on his left lateral calf just above the ankle. These were open. They are apparently putting some form of ointment on this. They have been referred here for our review of this. The patient has a long history of lymphedema and venous stasis. It says in his records that he has recurrent cellulitis although his wife denies this but she states that the lymphedema may have started with cellulitis several years ago. Also in his record there is a history of bladder cancer which they seem to know very little about however  he did not have any radiation to the pelvis or anything that could have contributed to lymphedema that they are aware of. There is no prior wound history. The patient has 2 small punched out areas on the left lateral calf that have adherent debris on the surface. Past medical history; lymphedema, hypertension, cellulitis, hearing loss, morbid obesity, osteoarthritis, venous stasis, bladder CA, diverticulosis. ABIs in our clinic were 0.36 on the right and 0.57 on the left 05/24/2018; corrected age on this patient is 70 versus what I said last week. He has been for his arterial studies which predictably are not very good. On the right his ABI is 0.65. Monophasic waveforms noted at the right ankle including the posterior tibial and dorsalis pedis. He has triphasic waveforms of the common femoral and profunda femoris triphasic proximal SFA with monophasic distal FSA and popliteal artery. Monophasic tibial waveforms. On the left his ABI is 0.58 monophasic waveforms at the left ankle. Duplex of the left lower extremity demonstrated atherosclerotic change with monophasic waveforms throughout the left lower extremity. There was some concern about left iliac disease and potentially femoral-popliteal and tibial disease. The patient does not describe claudication although according to his wife he over emphasizes his activity. Patient states he is limited by pain in both knees His wounds are on the left lateral calf. 2 small areas with necrotic debris. We have been using silver collagen and light Ace wraps 05/31/2018; wounds on the left lateral calf in the setting of very significant lymphedema and probably PAD. We have been using silver collagen. The base of the small wound looks reasonably improved. I sent him to see Dr. Fletcher Anon however I see now he has an appointment with Dr. Alvester Chou on February 12 2/5; left lateral calf wound looks the same. His wife is doing a good job with her lymphedema wraps and  maintaining  that the edema. He most likely has PAD and is due to see Dr. Gwenlyn Found of infectious disease on February 12 2/19; left lateral calf wounds look about the same. This looks like wound secondary to chronic venous insufficiency with lymphedema however the patient has very poor arterial studies. We referred him to Dr. Gwenlyn Found. Dr. Gwenlyn Found did not feel he needed to do anything from an arterial point of view. He did not address the question about the aggressiveness of compression. After some thoughts about this I elected to go ahead and put him in 3 layer compression which after all would be less compression then the lymphedema clinic was putting on this. I am hopeful that this should be enough to get some closure of the small wounds 2/26; left lateral calf wound letter this week. He tolerated the 3 layer compression well furthermore he is sleeping in a hospital bed which helps keep his legs up at night. The wound looks better measuring smaller especially in width 3/3; left lateral calf wound about the same size. The 3 layer compression has really helped get the edema down in his leg however. Using silver collagen 3/11; left lateral calf wound better looking wound surface but about the same size. We have been using 3 layer compression which is really helped get the edema down in the left leg. Even after Dr. Kennon Holter assessment I am reluctant to go to 4 layer compression. He is tolerating 3 layer compression well. We have been using silver collagen 3/18-Patient returns for his left lateral calf wound which overall is looking better, slightly increased in size and dimensions and area, he is tolerating the 3 layer compression, has been dressed with silver collagen which we will continue 3/25oleft lateral calf wound much better looking. Size is smaller. He has been using silver collagen 4/1; gradually getting smaller in size. Using silver collagen with 3 layer compression. I think the patient has some degree of PAD. He  saw Dr. Gwenlyn Found in consultation, he did not specifically comment on whether he could tolerate 4 layer compression 4/8; the wound is slightly smaller in depth. We are using 3 layer compression with silver collagen. I had him seen for arterial insufficiency however his ABI was 0.57 in the left leg. Dr. Gwenlyn Found really did not seem to middle on the degree of compression he could tolerate 4/15; the wound is slightly smaller and slightly less deep. We have been using 3 layer compression with silver collagen. There is not an option for 4-layer compression here. 4/22; the patient is making decent progress on his wound on the left lateral calf however he arrives in with a superficial wound on the right anterior tibial area. Were not really sure how this happened. He had been using a stocking on the right leg. He has been using silver collagen on the wounds Amazing to me he is he appears to have compression pumps at home. I am not really sure I knew this before. In any case he has not been using them 4/29; left lateral calf wound continues to get gradually smaller. This looks like it is on its way to closure. oThe area on the right anterior tibial area is about the same in terms of size superficial without any depth. This was probably wrap injury oHe tells me that he has been using his compression pumps once a day for 1 hour without any pain Steven Meza, Steven Meza ([id number]) 5/6; not as good today. Left lateral calf wound is actually larger  and he does not have quite as good edema control. The area on the right anterior tibia is about the same still superficial. He states he is using his compression pumps once a day. We are using 3 layer compression on the left and Curlex Coban on the right 5/13; the area on the right is very close to being healed. The area on the left still has some depth but has a healthy wound surface. He has excellent edema control bilaterally. Using Hydrofera Blue 5/20-Patient returns in 1 week  after being in 3 layer compression on the left, the left lateral calf ulcer is stable with some slough, the right anterior tibial ulcer is healed we have been using Kerlix Coban on that leg 5/27; Hydrofera Blue and 3 layer compression. Still some slough in the wound area and some nonviable edges around the wound. 6/3; this patient has lymphedema and chronic venous insufficiency. He had difficult to close wound on his left lateral calf. This is closed today. 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 Finally he has known PAD but he is managed to tolerate the compression we reporting on him and also using his compression pumps that he already had at home twice a day 6/10; we discharge this patient last week having finally close the small area in the left lateral calf. Apparently by Friday of last week this had opened and was draining. He is therefore back in clinic. His wife who is present also was concerned about an area on the right anterior tibia."o 6/17; the patient does not have an open area on the right leg. On the left he has a small circular area anteriorly and the same area laterally that he had last time. However both of these appear to be improved he has his Farrow wrap 30/40. It turns out that he is only using his external compression pumps once a day. I tried to get him up to twice a day today 6/24; the patient comes in with his external compression garment on the right leg/Farrow wrap. As far as we know no open wounds here. He has 2 areas that are smaller one on the left anterior pretibial and one on the left lateral calf which is the site of his initial wounds. I have successfully caught him into his external compression pumps twice a day 7/1; still using his external compression garment on the right leg. As far as we know there are no open wounds here. The area on the left lateral calf which was the site of his initial wound has closed over. The area  on the left anterior pretibial that we identified last week is still open. 7/8; as far as we know he has no open wounds on the right leg. He has his external compression garment on here. The original wound on the left lateral calf has closed over. He developed an area on the left anterior pretibial 2 weeks ago and seems to have 2 smaller areas on the proximal and distal lateral calf. We do not have his good edema control this week as I am used to seeing 7/15; the patient has still a linear area on the left anterior tibia. The area on the left lateral leg looked somewhat inflamed today but no cellulitis. He did have what looked to be a small pustule that I cultured for CandS although I did not give him empiric antibiotics. 7/22; small area on the left anterior tibia. The area on the  left lateral leg still looks inflamed. The pustule that I cultured last week grew Enterobacter and Pseudomonas. I prescribed Cipro they still have not picked that up. In any case the area actually looks fairly satisfactory. He has decent edema control using his compression pumps twice a day 7/29; the area on the left lateral leg looks a lot better than last week. Nothing is open here. He has an area just medial to the mid anterior tibia that is still open that is only open wound today. 8/5-Patient returns at 1 week for the left leg ulcer, which is actually looking better, he is also complaining of left plantar foot pain below the great toe. He does have a hematoma here 8/12-Patient returns at 1 week, the left leg open area looks about the same if not slightly better, the left plantar hematoma under the great toe is about the same, patient apparently has not been resting his foot on anything for too long period of time and does not wear the Birkenstock shoes unless he walks outdoors which he does not do often 8/19; small wound on the left anterior lower leg this is just about closed there is nothing on the left lateral leg.  He is using his compression pumps He arrives in clinic today with a black eschar over the right first plantar metatarsal head. This was apparently a bruise that was identified by her nurses last week. He wears crocs on his feet. He also may rub these on a footboard at home 8/26; there is nothing open on the left lateral lower leg he has a small area just medial to the tibia. He is going to see his podiatrist Dr. Milinda Pointer about the area on the left plantar first metatarsal head. He is using his external compression pumps. He has a juxta lite on the right leg 9/2; there is nothing open on either side of her left lower leg. He went to see podiatry about the area on the left first metatarsal head. This was apparently debrided. He was given mupirocin and oral antibiotics. I do not believe he is offloading this at all. Readmission: 04/02/2019 upon evaluation today patient actually appears for reevaluation here in our clinic secondary to issues that he is having with his left lower extremity. He has been tolerating the dressing changes without complication. Fortunately there is no signs of active infection at this time. No fevers, chills, nausea, vomiting, or diarrhea. He has been in the hospital since we last saw him and then subsequently transferred back to [facility]. He tells me that they have been changing his dressing once a day and that he has had a significant amount of pain secondary to undergoing these dressing changes. Fortunately there is no evidence of active infection at this point which is good news. No fevers, chills, nausea, vomiting, or diarrhea. With that being said he wonders if we can go back to doing what we were doing before which was when he actually had the dressing changes performed once a week here in the clinic he tells me that he did very well with that. At that point he tolerated a 3 layer compression wrap which was approved by vascular, Dr. Tyrell Antonio office, to be used previous  although 4-layer compression wrap is probably too much for him. 04/16/2019 on evaluation today patient appears to be doing decently well with regard to his lower extremity although he has had a lot of drainage from his leg. A lot of this I think is just  secondary to the lymphedema with that being said he does have some odor as well which may again just be due to more of the excessive moisture although I think that he may also have something that is a result of bacteria again he has somewhat of a Pseudomonas-like smell to the drainage and fortunately though he is not showing any signs of severe cellulitis he is still experiencing quite a bit of drainage and the discoloration is somewhat of a yellow/green in color which is consistent with Pseudomonas. If he can tolerated I think he may do well with Cipro. 04/24/2019 on evaluation today patient appears to be doing well with regard to his wound in the gluteal region which is healing quite well. That is excellent news. With that being said he is also doing better with regard to his left lower extremity overall I feel like things are definitely showing signs of improvement. 12/28-Patient returns with regards to his gluteal wound that is improving, Left lower leg wounds with significant edema in the lower legs that appear to be persistent. Patient is in compression and has lymphedema pumps at home but apparently he does not use consistently 05/07/2019 Upon inspection patient's wound bed actually showed signs on evaluation today patient's wound currently showed signs of improvement. Steven Meza, Steven Meza ([id number]) There is still a lot of maceration but it does look like that there is been some of this maceration occurring as a result of drainage in particular and some as a result of the fact that they have been putting bag balm on the wound area as well as the leg in general. I think this is applying too much moisture and not doing him any good. I discussed with  the patient and his wife today that I think that practice needs to be adjusted I would recommend using a different type of lotion in order to help with moisture around the wound but I would not recommend anything directly on the wound itself. 05/14/2019 on evaluation today patient actually appears to be doing much better in regard to his lower extremity. They have not been using any of the bag balm on the area I think this has made a tremendous improvement overall in his wounds. He has a lot of new skin growth and though there still are a lot of open wounds scattered throughout the circumferential lower leg this overall seems to be doing much better to me. 05/21/2019 upon evaluation today patient appears to be doing well in regard to his lower extremity although he is having unfortunately some blue-green drainage noted at this point. The does have me concerned for a reinitiation of the Pseudomonas infection that we previously treated in December. The last time I gave him a prescription was April 16, 2019. That was a little over a month ago. Nonetheless he does have more green drainage noted today and I think this is indicative of potentially a revving up infection. Obviously we do not want to get to that point. 05/28/2019 on evaluation today patient appears to be doing better with regard to his lower extremity at this point. Overall I am seeing signs of improvement which is good news. Fortunately there is no evidence of active infection at this time. I do believe that the Cipro has been beneficial for him that was prescribed last week for 14 days he is halfway through that and already appears to be doing much better. This is excellent news. 06/04/2019 upon evaluation today I do believe that the patient  is doing better with regard to his lower extremity. I feel like that he has much less in the way of open wounds and drainage and much in provement in regard to the overall weeping and even his swelling.  There is no signs of erythema no obvious evidence of active infection all of which is great news. No fevers, chills, nausea, vomiting, or diarrhea. 06/11/2019 on evaluation today patient appears to be doing better with regard to his lower extremity on the left. He has been tolerating dressing changes without complication. Fortunately there is no signs of active infection at this time. No fevers, chills, nausea, vomiting, or diarrhea. 06/18/2019 upon evaluation today patient appears to be doing slightly worse compared to last week. They did call me on Friday due to the fact that he had green discharge and both his wife as well as his home health nurse were concerned about what they were seeing. Subsequently I did actually send in a prescription for Cipro for 14 days for him and according to his wife this seems to be doing better based on what she is seeing today compared to Friday. This is great news. Home health is still coming out to see him on Wednesdays and Fridays as well. 06/25/2019 upon evaluation today patient appears to be doing somewhat better in regard to his leg ulcer. Fortunately there is no signs of active infection at this time which is good news. With that being said I am very pleased with how things seem to be going and overall I see no major issues here I think the alginate is doing well along with the XtraSorb. With regard to the patient's plantar foot he does have somewhat of a blood blister that appears to be resolving at this point his wife states that she feels like the issue is that he is not dropping his shoe in order to keep it from sliding when he walks. Subsequently this is leading to increases in friction which of course is leading to more issues with getting this area to heal the patient is struggling in that regard. Electronic Signature(s) Signed: 06/25/2019 2:51:57 PM By: Worthy Keeler PA-C Entered By: Worthy Keeler on 06/25/2019 14:51:57 Steven Meza  ([id number]) -------------------------------------------------------------------------------- Physical Exam Details Patient Name: Steven Meza Date of Service: 06/25/2019 1:45 PM Medical Record Number: [id number] Patient Account Number: [id number] Date of Birth/Sex: 1931/01/05 (86 y.o. M) Treating RN: Cornell Barman Primary Care Provider: Fulton Reek Other Clinician: Referring Provider: Fulton Reek Treating Provider/Extender: Melburn Hake, Kaeli Nichelson Weeks in Treatment: 45 Constitutional Well-nourished and well-hydrated in no acute distress. Respiratory normal breathing without difficulty. Psychiatric this patient is able to make decisions and demonstrates good insight into disease process. Alert and Oriented x 3. pleasant and cooperative. Notes Patient's wounds currently appear to be mainly lymphedema openings of the lower extremity. Overall I feel like he is starting to dry up we see less of these regions noted at this point which is good news. With regard to the edema control that still under pretty good control with the wrap here today as well. In regard to the blood blister on the bottom of his foot I did have a discussion with the patient about the fact he needs to make sure to tighten his shoes whenever he goes to walk in order to prevent this from sliding on his foot this is good to make a big difference as far as healing in general is concerned. Electronic Signature(s) Signed: 06/25/2019 2:52:57 PM By: Joaquim Lai  III, Samiha Denapoli PA-C Entered By: Worthy Keeler on 06/25/2019 14:52:57 Steven Meza ([id number]) -------------------------------------------------------------------------------- Physician Orders Details Patient Name: Steven Meza Date of Service: 06/25/2019 1:45 PM Medical Record Number: [id number] Patient Account Number: [id number] Date of Birth/Sex: 07-08-1930 (86 y.o. M) Treating RN: Army Melia Primary Care Provider: Fulton Reek Other Clinician: Referring Provider:  Fulton Reek Treating Provider/Extender: Melburn Hake, Ottilia Pippenger Weeks in Treatment: 12 Verbal / Phone Orders: No Diagnosis Coding ICD-10 Coding Meza Description I89.0 Lymphedema, not elsewhere classified I87.2 Venous insufficiency (chronic) (peripheral) L97.822 Non-pressure chronic ulcer of other part of left lower leg with fat layer exposed I10 Essential (primary) hypertension I25.10 Atherosclerotic heart disease of native coronary artery without angina pectoris L84 Corns and callosities Wound Cleansing o Dial antibacterial soap, wash wounds, rinse and pat dry prior to dressing wounds o May shower with protection. Primary Wound Dressing o Silver Alginate Secondary Dressing o ABD pad Dressing Change Frequency o Change Dressing Monday, Wednesday, Friday - Monday in office, Wednesday and Friday by [facility] Follow-up Appointments o Return Appointment in 1 week. Edema Control o 3 Layer Compression System - Left [place] Visits - [facility] o Home Health Nurse may visit PRN to address patientos wound care needs. o FACE TO FACE ENCOUNTER: MEDICARE and MEDICAID PATIENTS: I certify that this patient is under my care and that I had a face-to- face encounter that meets the physician face-to-face encounter requirements with this patient on this date. The encounter with the patient was in whole or in part for the following MEDICAL CONDITION: (primary reason for [place]) MEDICAL NECESSITY: I certify, that based on my findings, NURSING services are a medically necessary home health service. HOME BOUND STATUS: I certify that my clinical findings support that this patient is homebound (i.e., Due to illness or injury, pt requires aid of supportive devices such as crutches, cane, wheelchairs, walkers, the use of special transportation or the assistance of another person to leave their place of residence. There is a normal inability to leave  the home and doing so requires considerable and taxing effort. Other absences are for medical reasons / religious services and are infrequent or of short duration when for other reasons). o If current dressing causes regression in wound condition, may D/C ordered dressing product/s and apply Normal Saline Moist Dressing daily until next [place] / Other MD appointment. [place] of regression in wound condition at [phone]. o Please direct any NON-WOUND related issues/requests for orders to patient's Primary Care Physician Electronic Signature(s) Signed: 06/25/2019 4:40:14 PM By: Army Melia Signed: 06/25/2019 5:29:33 PM By: Worthy Keeler PA-C Entered By: Army Melia on 06/25/2019 14:35:13 Steven Meza ([id number]) -------------------------------------------------------------------------------- Problem List Details Patient Name: Steven Meza Date of Service: 06/25/2019 1:45 PM Medical Record Number: [id number] Patient Account Number: [id number] Date of Birth/Sex: 09-14-1930 (86 y.o. M) Treating RN: Cornell Barman Primary Care Provider: Fulton Reek Other Clinician: Referring Provider: Fulton Reek Treating Provider/Extender: Melburn Hake, Saranda Legrande Weeks in Treatment: 12 Active Problems ICD-10 Evaluated Encounter Meza Description Active Date Today Diagnosis I89.0 Lymphedema, not elsewhere classified 04/02/2019 No Yes I87.2 Venous insufficiency (chronic) (peripheral) 04/02/2019 No Yes L97.822 Non-pressure chronic ulcer of other part of left lower leg with fat layer 04/02/2019 No Yes exposed [place] (primary) hypertension 04/02/2019 No Yes I25.10 Atherosclerotic heart disease of native coronary artery without angina 04/02/2019 No Yes pectoris L84 Corns and callosities 05/09/2019 No Yes Inactive Problems Resolved Problems Electronic Signature(s) Signed:  06/25/2019 2:24:06 PM By: Worthy Keeler PA-C Entered By: Worthy Keeler on  06/25/2019 14:24:06 Steven Meza ([id number]) -------------------------------------------------------------------------------- Progress Note Details Patient Name: Steven Meza Date of Service: 06/25/2019 1:45 PM Medical Record Number: [id number] Patient Account Number: [id number] Date of Birth/Sex: 09-Jul-1930 (86 y.o. M) Treating RN: Cornell Barman Primary Care Provider: Fulton Reek Other Clinician: Referring Provider: Fulton Reek Treating Provider/Extender: Melburn Hake, Dickie Cloe Weeks in Treatment: 12 Subjective Chief Complaint Information obtained from Patient Left LE Ulcer History of Present Illness (HPI) ADMISSION 05/17/2018 Mr. Delaossa is an 86 year old man who is been treated at the lymphedema clinic for a long period with lymphedema wraps for bilateral lower extremity lymphedema. About 6 months ago they noted small open areas on his left lateral calf just above the ankle. These were open. They are apparently putting some form of ointment on this. They have been referred here for our review of this. The patient has a long history of lymphedema and venous stasis. It says in his records that he has recurrent cellulitis although his wife denies this but she states that the lymphedema may have started with cellulitis several years ago. Also in his record there is a history of bladder cancer which they seem to know very little about however he did not have any radiation to the pelvis or anything that could have contributed to lymphedema that they are aware of. There is no prior wound history. The patient has 2 small punched out areas on the left lateral calf that have adherent debris on the surface. Past medical history; lymphedema, hypertension, cellulitis, hearing loss, morbid obesity, osteoarthritis, venous stasis, bladder CA, diverticulosis. ABIs in our clinic were 0.36 on the right and 0.57 on the left 05/24/2018; corrected age on this patient is 40 versus what I said last week. He  has been for his arterial studies which predictably are not very good. On the right his ABI is 0.65. Monophasic waveforms noted at the right ankle including the posterior tibial and dorsalis pedis. He has triphasic waveforms of the common femoral and profunda femoris triphasic proximal SFA with monophasic distal FSA and popliteal artery. Monophasic tibial waveforms. On the left his ABI is 0.58 monophasic waveforms at the left ankle. Duplex of the left lower extremity demonstrated atherosclerotic change with monophasic waveforms throughout the left lower extremity. There was some concern about left iliac disease and potentially femoral-popliteal and tibial disease. The patient does not describe claudication although according to his wife he over emphasizes his activity. Patient states he is limited by pain in both knees His wounds are on the left lateral calf. 2 small areas with necrotic debris. We have been using silver collagen and light Ace wraps 05/31/2018; wounds on the left lateral calf in the setting of very significant lymphedema and probably PAD. We have been using silver collagen. The base of the small wound looks reasonably improved. I sent him to see Dr. Fletcher Anon however I see now he has an appointment with Dr. Alvester Chou on February 12 2/5; left lateral calf wound looks the same. His wife is doing a good job with her lymphedema wraps and maintaining that the edema. He most likely has PAD and is due to see Dr. Gwenlyn Found of infectious disease on February 12 2/19; left lateral calf wounds look about the same. This looks like wound secondary to chronic venous insufficiency with lymphedema however the patient has very poor arterial studies. We referred him to Dr. Gwenlyn Found. Dr. Gwenlyn Found did not feel he needed  to do anything from an arterial point of view. He did not address the question about the aggressiveness of compression. After some thoughts about this I elected to go ahead and put him in 3  layer compression which after all would be less compression then the lymphedema clinic was putting on this. I am hopeful that this should be enough to get some closure of the small wounds 2/26; left lateral calf wound letter this week. He tolerated the 3 layer compression well furthermore he is sleeping in a hospital bed which helps keep his legs up at night. The wound looks better measuring smaller especially in width 3/3; left lateral calf wound about the same size. The 3 layer compression has really helped get the edema down in his leg however. Using silver collagen 3/11; left lateral calf wound better looking wound surface but about the same size. We have been using 3 layer compression which is really helped get the edema down in the left leg. Even after Dr. Kennon Holter assessment I am reluctant to go to 4 layer compression. He is tolerating 3 layer compression well. We have been using silver collagen 3/18-Patient returns for his left lateral calf wound which overall is looking better, slightly increased in size and dimensions and area, he is tolerating the 3 layer compression, has been dressed with silver collagen which we will continue 3/25 left lateral calf wound much better looking. Size is smaller. He has been using silver collagen 4/1; gradually getting smaller in size. Using silver collagen with 3 layer compression. I think the patient has some degree of PAD. He saw Dr. Gwenlyn Found in consultation, he did not specifically comment on whether he could tolerate 4 layer compression 4/8; the wound is slightly smaller in depth. We are using 3 layer compression with silver collagen. I had him seen for arterial insufficiency however his ABI was 0.57 in the left leg. Dr. Gwenlyn Found really did not seem to middle on the degree of compression he could tolerate 4/15; the wound is slightly smaller and slightly less deep. We have been using 3 layer compression with silver collagen. There is not an option for 4-layer  compression here. 4/22; the patient is making decent progress on his wound on the left lateral calf however he arrives in with a superficial wound on the right anterior tibial area. Were not really sure how this happened. He had been using a stocking on the right leg. He has been using silver collagen on the wounds Steven Meza, Steven Meza ([id number]) Amazing to me he is he appears to have compression pumps at home. I am not really sure I knew this before. In any case he has not been using them 4/29; left lateral calf wound continues to get gradually smaller. This looks like it is on its way to closure. The area on the right anterior tibial area is about the same in terms of size superficial without any depth. This was probably wrap injury He tells me that he has been using his compression pumps once a day for 1 hour without any pain 5/6; not as good today. Left lateral calf wound is actually larger and he does not have quite as good edema control. The area on the right anterior tibia is about the same still superficial. He states he is using his compression pumps once a day. We are using 3 layer compression on the left and Curlex Coban on the right 5/13; the area on the right is very close to being healed. The  area on the left still has some depth but has a healthy wound surface. He has excellent edema control bilaterally. Using Hydrofera Blue 5/20-Patient returns in 1 week after being in 3 layer compression on the left, the left lateral calf ulcer is stable with some slough, the right anterior tibial ulcer is healed we have been using Kerlix Coban on that leg 5/27; Hydrofera Blue and 3 layer compression. Still some slough in the wound area and some nonviable edges around the wound. 6/3; this patient has lymphedema and chronic venous insufficiency. He had difficult to close wound on his left lateral calf. This is closed today. He has also chronic stasis dermatitis and xerotic skin in his lower  extremities. Finally he has known PAD but he is managed to tolerate the compression we reporting on him and also using his compression pumps that he already had at home twice a day 6/10; we discharge this patient last week having finally close the small area in the left lateral calf. Apparently by Friday of last week this had opened and was draining. He is therefore back in clinic. His wife who is present also was concerned about an area on the right anterior tibia."o 6/17; the patient does not have an open area on the right leg. On the left he has a small circular area anteriorly and the same area laterally that he had last time. However both of these appear to be improved he has his [place] wrap 30/40. It turns out that he is only using his external compression pumps once a day. I tried to get him up to twice a day today 6/24; the patient comes in with his external compression garment on the right leg/[place] wrap. As far as we know no open wounds here. He has 2 areas that are smaller one on the left anterior pretibial and one on the left lateral calf which is the site of his initial wounds. I have successfully caught him into his external compression pumps twice a day 7/1; still using his external compression garment on the right leg. As far as we know there are no open wounds here. The area on the left lateral calf which was the site of his initial wound has closed over. The area on the left anterior pretibial that we identified last week is still open. 7/8; as far as we know he has no open wounds on the right leg. He has his external compression garment on here. The original wound on the left lateral calf has closed over. He developed an area on the left anterior pretibial 2 weeks ago and seems to have 2 smaller areas on the proximal and distal lateral calf. We do not have his good edema control this week as I am used to seeing 7/15; the patient has still a linear area on the left anterior tibia.  The area on the left lateral leg looked somewhat inflamed today but no cellulitis. He did have what looked to be a small pustule that I cultured for CandS although I did not give him empiric antibiotics. 7/22; small area on the left anterior tibia. The area on the left lateral leg still looks inflamed. The pustule that I cultured last week grew Enterobacter and Pseudomonas. I prescribed Cipro they still have not picked that up. In any case the area actually looks fairly satisfactory. He has decent edema control using his compression pumps twice a day 7/29; the area on the left lateral leg looks a lot better than last  week. Nothing is open here. He has an area just medial to the mid anterior tibia that is still open that is only open wound today. 8/5-Patient returns at 1 week for the left leg ulcer, which is actually looking better, he is also complaining of left plantar foot pain below the great toe. He does have a hematoma here 8/12-Patient returns at 1 week, the left leg open area looks about the same if not slightly better, the left plantar hematoma under the great toe is about the same, patient apparently has not been resting his foot on anything for too long period of time and does not wear the [employer]kenstock shoes unless he walks outdoors which he does not do often 8/19; small wound on the left anterior lower leg this is just about closed there is nothing on the left lateral leg. He is using his compression pumps He arrives in clinic today with a black eschar over the right first plantar metatarsal head. This was apparently a bruise that was identified by her nurses last week. He wears crocs on his feet. He also may rub these on a footboard at home 8/26; there is nothing open on the left lateral lower leg he has a small area just medial to the tibia. He is going to see his podiatrist Dr. Milinda Pointer about the area on the left plantar first metatarsal head. He is using his external compression pumps. He  has a juxta lite on the right leg 9/2; there is nothing open on either side of her left lower leg. He went to see podiatry about the area on the left first metatarsal head. This was apparently debrided. He was given mupirocin and oral antibiotics. I do not believe he is offloading this at all. Readmission: 04/02/2019 upon evaluation today patient actually appears for reevaluation here in our clinic secondary to issues that he is having with his left lower extremity. He has been tolerating the dressing changes without complication. Fortunately there is no signs of active infection at this time. No fevers, chills, nausea, vomiting, or diarrhea. He has been in the hospital since we last saw him and then subsequently transferred back to [facility]. He tells me that they have been changing his dressing once a day and that he has had a significant amount of pain secondary to undergoing these dressing changes. Fortunately there is no evidence of active infection at this point which is good news. No fevers, chills, nausea, vomiting, or diarrhea. With that being said he wonders if we can go back to doing what we were doing before which was when he actually had the dressing changes performed once a week here in the clinic he tells me that he did very well with that. At that point he tolerated a 3 layer compression wrap which was approved by vascular, Dr. Tyrell Antonio office, to be used previous although 4-layer compression wrap is probably too much for him. 04/16/2019 on evaluation today patient appears to be doing decently well with regard to his lower extremity although he has had a lot of drainage from his leg. A lot of this I think is just secondary to the lymphedema with that being said he does have some odor as well which may again just be due to more of the excessive moisture although I think that he may also have something that is a result of bacteria again he has somewhat of a Pseudomonas-like smell to  the drainage and fortunately though he is not showing any signs  of severe cellulitis he is still experiencing quite a bit of drainage and the discoloration is somewhat of a yellow/green in color which is consistent with Pseudomonas. If he can tolerated I think he may do well with Cipro. 04/24/2019 on evaluation today patient appears to be doing well with regard to his wound in the gluteal region which is healing quite well. That is excellent news. With that being said he is also doing better with regard to his left lower extremity overall I feel like things are definitely showing signs of improvement. Steven Meza, Steven Meza ([id number]) 12/28-Patient returns with regards to his gluteal wound that is improving, Left lower leg wounds with significant edema in the lower legs that appear to be persistent. Patient is in compression and has lymphedema pumps at home but apparently he does not use consistently 05/07/2019 Upon inspection patient's wound bed actually showed signs on evaluation today patient's wound currently showed signs of improvement. There is still a lot of maceration but it does look like that there is been some of this maceration occurring as a result of drainage in particular and some as a result of the fact that they have been putting bag balm on the wound area as well as the leg in general. I think this is applying too much moisture and not doing him any good. I discussed with the patient and his wife today that I think that practice needs to be adjusted I would recommend using a different type of lotion in order to help with moisture around the wound but I would not recommend anything directly on the wound itself. 05/14/2019 on evaluation today patient actually appears to be doing much better in regard to his lower extremity. They have not been using any of the bag balm on the area I think this has made a tremendous improvement overall in his wounds. He has a lot of new skin growth and though  there still are a lot of open wounds scattered throughout the circumferential lower leg this overall seems to be doing much better to me. 05/21/2019 upon evaluation today patient appears to be doing well in regard to his lower extremity although he is having unfortunately some blue-green drainage noted at this point. The does have me concerned for a reinitiation of the Pseudomonas infection that we previously treated in December. The last time I gave him a prescription was April 16, 2019. That was a little over a month ago. Nonetheless he does have more green drainage noted today and I think this is indicative of potentially a revving up infection. Obviously we do not want to get to that point. 05/28/2019 on evaluation today patient appears to be doing better with regard to his lower extremity at this point. Overall I am seeing signs of improvement which is good news. Fortunately there is no evidence of active infection at this time. I do believe that the Cipro has been beneficial for him that was prescribed last week for 14 days he is halfway through that and already appears to be doing much better. This is excellent news. 06/04/2019 upon evaluation today I do believe that the patient is doing better with regard to his lower extremity. I feel like that he has much less in the way of open wounds and drainage and much in provement in regard to the overall weeping and even his swelling. There is no signs of erythema no obvious evidence of active infection all of which is great news. No fevers, chills, nausea, vomiting,  or diarrhea. 06/11/2019 on evaluation today patient appears to be doing better with regard to his lower extremity on the left. He has been tolerating dressing changes without complication. Fortunately there is no signs of active infection at this time. No fevers, chills, nausea, vomiting, or diarrhea. 06/18/2019 upon evaluation today patient appears to be doing slightly worse compared to last  week. They did call me on Friday due to the fact that he had green discharge and both his wife as well as his home health nurse were concerned about what they were seeing. Subsequently I did actually send in a prescription for Cipro for 14 days for him and according to his wife this seems to be doing better based on what she is seeing today compared to Friday. This is great news. Home health is still coming out to see him on Wednesdays and Fridays as well. 06/25/2019 upon evaluation today patient appears to be doing somewhat better in regard to his leg ulcer. Fortunately there is no signs of active infection at this time which is good news. With that being said I am very pleased with how things seem to be going and overall I see no major issues here I think the alginate is doing well along with the XtraSorb. With regard to the patient's plantar foot he does have somewhat of a blood blister that appears to be resolving at this point his wife states that she feels like the issue is that he is not dropping his shoe in order to keep it from sliding when he walks. Subsequently this is leading to increases in friction which of course is leading to more issues with getting this area to heal the patient is struggling in that regard. Objective Constitutional Well-nourished and well-hydrated in no acute distress. Vitals Time Taken: 1:48 PM, Height: 65 in, Weight: 273 lbs, BMI: 45.4, Temperature: 98.3 F, Pulse: 73 bpm, Respiratory Rate: 18 breaths/min, Blood Pressure: 151/57 mmHg. Respiratory normal breathing without difficulty. Psychiatric this patient is able to make decisions and demonstrates good insight into disease process. Alert and Oriented x 3. pleasant and cooperative. General Notes: Patient's wounds currently appear to be mainly lymphedema openings of the lower extremity. Overall I feel like he is starting to dry up we see less of these regions noted at this point which is good news. With regard  to the edema control that still under pretty good control with the wrap here today as well. In regard to the blood blister on the bottom of his foot I did have a discussion with the patient about the fact he needs to make sure to tighten his shoes whenever he goes to walk in order to prevent this from sliding on his foot this is good to make a big difference as far as healing in general is concerned. Steven Meza, Steven Meza ([id number]) Integumentary (Hair, Skin) Wound #6 status is Converted. Original cause of wound was Gradually Appeared. The wound is located on the Left,Circumferential Lower Leg. There is Fat Layer (Subcutaneous Tissue) Exposed exposed. There is a medium amount of serous drainage noted. The wound margin is indistinct and nonvisible. There is large (67-100%) pink granulation within the wound bed. There is a small (1-33%) amount of necrotic tissue within the wound bed including Adherent Slough. Other Condition(s) Patient presents with Lymphedema located on the Left Leg. Assessment Active Problems ICD-10 Lymphedema, not elsewhere classified Venous insufficiency (chronic) (peripheral) Non-pressure chronic ulcer of other part of left lower leg with fat layer exposed Essential (primary) hypertension  Atherosclerotic heart disease of native coronary artery without angina pectoris Corns and callosities Procedures There was a Three Layer Compression Therapy Procedure by Army Melia, RN. Post procedure Diagnosis Wound #: Same as Pre-Procedure Plan Wound Cleansing: Dial antibacterial soap, wash wounds, rinse and pat dry prior to dressing wounds May shower with protection. Primary Wound Dressing: Silver Alginate Secondary Dressing: ABD pad Dressing Change Frequency: Change Dressing Monday, Wednesday, Friday - Monday in office, Wednesday and Friday by [facility] Follow-up Appointments: Return Appointment in 1 week. Edema Control: 3 Layer Compression System - Left Lower Extremity Home  Health: [place] Nurse may visit PRN to address patient s wound care needs. FACE TO FACE ENCOUNTER: MEDICARE and MEDICAID PATIENTS: I certify that this patient is under my care and that I had a face-to-face encounter that meets the physician face-to-face encounter requirements with this patient on this date. The encounter with the patient was in whole or in part for the following MEDICAL CONDITION: (primary reason for [place]) MEDICAL NECESSITY: I certify, that based on my findings, NURSING services are a medically necessary home health service. HOME BOUND STATUS: I certify that my clinical findings support that this patient is homebound (i.e., Due to illness or injury, pt requires aid of supportive devices such as crutches, cane, wheelchairs, walkers, the use of special transportation or the assistance of another person to leave their place of residence. There is a normal inability to leave the home and doing so requires considerable and taxing effort. Other absences are for medical reasons / religious services and are infrequent or of short duration when for other reasons). If current dressing causes regression in wound condition, may D/C ordered dressing product/s and apply Normal Saline Moist Dressing daily until next [place] / Other MD appointment. [place] of regression in wound condition at [phone]. Please direct any NON-WOUND related issues/requests for orders to patient's Primary Care Physician Steven Meza, Steven Meza ([id number]) 1. I would recommend currently that we continue with the silver alginate and Xtrasorb to his wounds overall I feel like this is doing a good job at controlling the edema and keeping the infections under control. 2. I am also going to suggest that we continue with the 3 layer compression wrap which I feel like it is also beneficial for the patient. 3. I am also can recommend that we  continue to have the patient elevate his legs much as possible this will help with edema control. 4. Also did have a discussion with the patient about tight in his shoe so we will not rub and cause any further blistering on the plantar aspect of his foot he is going to do this he tells me I think that will make a big difference as well. We will see patient back for reevaluation in 1 week here in the clinic. If anything worsens or changes patient will contact our office for additional recommendations. Electronic Signature(s) Signed: 06/25/2019 2:53:49 PM By: Worthy Keeler PA-C Entered By: Worthy Keeler on 06/25/2019 14:53:49 Steven Meza ([id number]) -------------------------------------------------------------------------------- SuperBill Details Patient Name: Steven Meza Date of Service: 06/25/2019 Medical Record Number: [id number] Patient Account Number: [id number] Date of Birth/Sex: January 17, 1931 (86 y.o. M) Treating RN: Army Melia Primary Care Provider: Fulton Reek Other Clinician: Referring Provider: Fulton Reek Treating Provider/Extender: Melburn Hake, Eshaan Titzer Weeks in Treatment: 12 Diagnosis Coding ICD-10 Codes Meza Description I89.0 Lymphedema, not elsewhere classified I87.2 Venous insufficiency (chronic) (peripheral) L97.822 Non-pressure chronic ulcer  of other part of left lower leg with fat layer exposed [place] (primary) hypertension I25.10 Atherosclerotic heart disease of native coronary artery without angina pectoris L84 Corns and callosities Facility Procedures CPT4 Meza: [id number] Description: (Facility Use Only) [phone] - [place] [facility] LT LEG Modifier: Quantity: 1 Physician Procedures CPT4 [id number] Description: [id number] - WC PHYS LEVEL 3 - EST PT Modifier: Quantity: 1 CPT4 Meza: Description: ICD-10 Diagnosis Description I89.0 Lymphedema, not elsewhere classified I87.2 Venous insufficiency (chronic) (peripheral) L97.822 Non-pressure  chronic ulcer of other part of left lower leg with fat layer e I10 Essential (primary)  hypertension Modifier: xposed Quantity: Electronic Signature(s) Signed: 06/25/2019 2:54:07 PM By: Worthy Keeler PA-C Entered By: Worthy Keeler on 06/25/2019 14:54:07

## 2019-07-02 ENCOUNTER — Other Ambulatory Visit: Payer: Self-pay

## 2019-07-02 ENCOUNTER — Encounter: Payer: Medicare Other | Attending: Physician Assistant | Admitting: Physician Assistant

## 2019-07-02 DIAGNOSIS — I2510 Atherosclerotic heart disease of native coronary artery without angina pectoris: Secondary | ICD-10-CM | POA: Diagnosis not present

## 2019-07-02 DIAGNOSIS — Z6841 Body Mass Index (BMI) 40.0 and over, adult: Secondary | ICD-10-CM | POA: Insufficient documentation

## 2019-07-02 DIAGNOSIS — K5790 Diverticulosis of intestine, part unspecified, without perforation or abscess without bleeding: Secondary | ICD-10-CM | POA: Diagnosis not present

## 2019-07-02 DIAGNOSIS — I890 Lymphedema, not elsewhere classified: Secondary | ICD-10-CM | POA: Insufficient documentation

## 2019-07-02 DIAGNOSIS — I10 Essential (primary) hypertension: Secondary | ICD-10-CM | POA: Insufficient documentation

## 2019-07-02 DIAGNOSIS — Z8551 Personal history of malignant neoplasm of bladder: Secondary | ICD-10-CM | POA: Insufficient documentation

## 2019-07-02 DIAGNOSIS — M1990 Unspecified osteoarthritis, unspecified site: Secondary | ICD-10-CM | POA: Insufficient documentation

## 2019-07-02 DIAGNOSIS — L84 Corns and callosities: Secondary | ICD-10-CM | POA: Insufficient documentation

## 2019-07-02 DIAGNOSIS — L97822 Non-pressure chronic ulcer of other part of left lower leg with fat layer exposed: Secondary | ICD-10-CM | POA: Diagnosis not present

## 2019-07-02 DIAGNOSIS — M79672 Pain in left foot: Secondary | ICD-10-CM | POA: Diagnosis not present

## 2019-07-02 DIAGNOSIS — I872 Venous insufficiency (chronic) (peripheral): Secondary | ICD-10-CM | POA: Insufficient documentation

## 2019-07-02 DIAGNOSIS — I878 Other specified disorders of veins: Secondary | ICD-10-CM | POA: Insufficient documentation

## 2019-07-02 NOTE — Progress Notes (Addendum)
METRO, PICKETT ([id number]) Visit Report for 07/02/2019 Chief Complaint Document Details Patient Name: Steven Meza Date of Service: 07/02/2019 1:45 PM Medical Record Number: [id number] Patient Account Number: [id number] Date of Birth/Sex: 1931-03-22 (86 y.o. M) Treating RN: Primary Care Provider: Fulton Reek Other Clinician: Referring Provider: Fulton Reek Treating Provider/Extender: Melburn Hake, Zyniah Ferraiolo Weeks in Treatment: 13 Information Obtained from: Patient Chief Complaint Left LE Ulcer Electronic Signature(s) Signed: 07/02/2019 2:03:49 PM By: Worthy Keeler PA-C Entered By: Worthy Keeler on 07/02/2019 14:03:49 Steven Meza ([id number]) -------------------------------------------------------------------------------- HPI Details Patient Name: Steven Meza Date of Service: 07/02/2019 1:45 PM Medical Record Number: [id number] Patient Account Number: [id number] Date of Birth/Sex: [birth year] (86 y.o. M) Treating RN: Primary Care Provider: Fulton Reek Other Clinician: Referring Provider: Fulton Reek Treating Provider/Extender: Melburn Hake, Kenneshia Rehm Weeks in Treatment: 13 History of Present Illness HPI Description: ADMISSION 05/17/2018 Steven Meza is an 85 year old man who is been treated at the lymphedema clinic for a long period with lymphedema wraps for bilateral lower extremity lymphedema. About 6 months ago they noted small open areas on his left lateral calf just above the ankle. These were open. They are apparently putting some form of ointment on this. They have been referred here for our review of this. The patient has a long history of lymphedema and venous stasis. It says in his records that he has recurrent cellulitis although his wife denies this but she states that the lymphedema may have started with cellulitis several years ago. Also in his record there is a history of bladder cancer which they seem to know very little about however he did not have any  radiation to the pelvis or anything that could have contributed to lymphedema that they are aware of. There is no prior wound history. The patient has 2 small punched out areas on the left lateral calf that have adherent debris on the surface. Past medical history; lymphedema, hypertension, cellulitis, hearing loss, morbid obesity, osteoarthritis, venous stasis, bladder CA, diverticulosis. ABIs in our clinic were 0.36 on the right and 0.57 on the left 05/24/2018; corrected age on this patient is 19 versus what I said last week. He has been for his arterial studies which predictably are not very good. On the right his ABI is 0.65. Monophasic waveforms noted at the right ankle including the posterior tibial and dorsalis pedis. He has triphasic waveforms of the common femoral and profunda femoris triphasic proximal SFA with monophasic distal FSA and popliteal artery. Monophasic tibial waveforms. On the left his ABI is 0.58 monophasic waveforms at the left ankle. Duplex of the left lower extremity demonstrated atherosclerotic change with monophasic waveforms throughout the left lower extremity. There was some concern about left iliac disease and potentially femoral-popliteal and tibial disease. The patient does not describe claudication although according to his wife he over emphasizes his activity. Patient states he is limited by pain in both knees His wounds are on the left lateral calf. 2 small areas with necrotic debris. We have been using silver collagen and light Ace wraps 05/31/2018; wounds on the left lateral calf in the setting of very significant lymphedema and probably PAD. We have been using silver collagen. The base of the small wound looks reasonably improved. I sent him to see Dr. Fletcher Anon however I see now he has an appointment with Dr. Alvester Chou on February 12 2/5; left lateral calf wound looks the same. His wife is doing a good job with her lymphedema wraps and maintaining that the edema.  He  most likely has PAD and is due to see Dr. Gwenlyn Found of infectious disease on February 12 2/19; left lateral calf wounds look about the same. This looks like wound secondary to chronic venous insufficiency with lymphedema however the patient has very poor arterial studies. We referred him to Dr. Gwenlyn Found. Dr. Gwenlyn Found did not feel he needed to do anything from an arterial point of view. He did not address the question about the aggressiveness of compression. After some thoughts about this I elected to go ahead and put him in 3 layer compression which after all would be less compression then the lymphedema clinic was putting on this. I am hopeful that this should be enough to get some closure of the small wounds 2/26; left lateral calf wound letter this week. He tolerated the 3 layer compression well furthermore he is sleeping in a hospital bed which helps keep his legs up at night. The wound looks better measuring smaller especially in width 3/3; left lateral calf wound about the same size. The 3 layer compression has really helped get the edema down in his leg however. Using silver collagen 3/11; left lateral calf wound better looking wound surface but about the same size. We have been using 3 layer compression which is really helped get the edema down in the left leg. Even after Dr. Kennon Holter assessment I am reluctant to go to 4 layer compression. He is tolerating 3 layer compression well. We have been using silver collagen 3/18-Patient returns for his left lateral calf wound which overall is looking better, slightly increased in size and dimensions and area, he is tolerating the 3 layer compression, has been dressed with silver collagen which we will continue 3/25oleft lateral calf wound much better looking. Size is smaller. He has been using silver collagen 4/1; gradually getting smaller in size. Using silver collagen with 3 layer compression. I think the patient has some degree of PAD. He saw Dr. Gwenlyn Found  in consultation, he did not specifically comment on whether he could tolerate 4 layer compression 4/8; the wound is slightly smaller in depth. We are using 3 layer compression with silver collagen. I had him seen for arterial insufficiency however his ABI was 0.57 in the left leg. Dr. Gwenlyn Found really did not seem to middle on the degree of compression he could tolerate 4/15; the wound is slightly smaller and slightly less deep. We have been using 3 layer compression with silver collagen. There is not an option for 4-layer compression here. 4/22; the patient is making decent progress on his wound on the left lateral calf however he arrives in with a superficial wound on the right anterior tibial area. Were not really sure how this happened. He had been using a stocking on the right leg. He has been using silver collagen on the wounds Amazing to me he is he appears to have compression pumps at home. I am not really sure I knew this before. In any case he has not been using them 4/29; left lateral calf wound continues to get gradually smaller. This looks like it is on its way to closure. oThe area on the right anterior tibial area is about the same in terms of size superficial without any depth. This was probably wrap injury oHe tells me that he has been using his compression pumps once a day for 1 hour without any pain Steven Meza, Steven Meza ([id number]) 5/6; not as good today. Left lateral calf wound is actually larger and he does not  have quite as good edema control. The area on the right anterior tibia is about the same still superficial. He states he is using his compression pumps once a day. We are using 3 layer compression on the left and Curlex Coban on the right 5/13; the area on the right is very close to being healed. The area on the left still has some depth but has a healthy wound surface. He has excellent edema control bilaterally. Using Hydrofera Blue 5/20-Patient returns in 1 week after being  in 3 layer compression on the left, the left lateral calf ulcer is stable with some slough, the right anterior tibial ulcer is healed we have been using Kerlix Coban on that leg 5/27; Hydrofera Blue and 3 layer compression. Still some slough in the wound area and some nonviable edges around the wound. 6/3; this patient has lymphedema and chronic venous insufficiency. He had difficult to close wound on his left lateral calf. This is closed today. 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 Finally he has known PAD but he is managed to tolerate the compression we reporting on him and also using his compression pumps that he already had at home twice a day 6/10; we discharge this patient last week having finally close the small area in the left lateral calf. Apparently by Friday of last week this had opened and was draining. He is therefore back in clinic. His wife who is present also was concerned about an area on the right anterior tibia."o 6/17; the patient does not have an open area on the right leg. On the left he has a small circular area anteriorly and the same area laterally that he had last time. However both of these appear to be improved he has his Farrow wrap 30/40. It turns out that he is only using his external compression pumps once a day. I tried to get him up to twice a day today 6/24; the patient comes in with his external compression garment on the right leg/Farrow wrap. As far as we know no open wounds here. He has 2 areas that are smaller one on the left anterior pretibial and one on the left lateral calf which is the site of his initial wounds. I have successfully caught him into his external compression pumps twice a day 7/1; still using his external compression garment on the right leg. As far as we know there are no open wounds here. The area on the left lateral calf which was the site of his initial wound has closed over. The area on the left  anterior pretibial that we identified last week is still open. 7/8; as far as we know he has no open wounds on the right leg. He has his external compression garment on here. The original wound on the left lateral calf has closed over. He developed an area on the left anterior pretibial 2 weeks ago and seems to have 2 smaller areas on the proximal and distal lateral calf. We do not have his good edema control this week as I am used to seeing 7/15; the patient has still a linear area on the left anterior tibia. The area on the left lateral leg looked somewhat inflamed today but no cellulitis. He did have what looked to be a small pustule that I cultured for CandS although I did not give him empiric antibiotics. 7/22; small area on the left anterior tibia. The area on the left lateral leg still  looks inflamed. The pustule that I cultured last week grew Enterobacter and Pseudomonas. I prescribed Cipro they still have not picked that up. In any case the area actually looks fairly satisfactory. He has decent edema control using his compression pumps twice a day 7/29; the area on the left lateral leg looks a lot better than last week. Nothing is open here. He has an area just medial to the mid anterior tibia that is still open that is only open wound today. 8/5-Patient returns at 1 week for the left leg ulcer, which is actually looking better, he is also complaining of left plantar foot pain below the great toe. He does have a hematoma here 8/12-Patient returns at 1 week, the left leg open area looks about the same if not slightly better, the left plantar hematoma under the great toe is about the same, patient apparently has not been resting his foot on anything for too long period of time and does not wear the [employer]kenstock shoes unless he walks outdoors which he does not do often 8/19; small wound on the left anterior lower leg this is just about closed there is nothing on the left lateral leg. He is using  his compression pumps He arrives in clinic today with a black eschar over the right first plantar metatarsal head. This was apparently a bruise that was identified by her nurses last week. He wears crocs on his feet. He also may rub these on a footboard at home 8/26; there is nothing open on the left lateral lower leg he has a small area just medial to the tibia. He is going to see his podiatrist Dr. Milinda Pointer about the area on the left plantar first metatarsal head. He is using his external compression pumps. He has a juxta lite on the right leg 9/2; there is nothing open on either side of her left lower leg. He went to see podiatry about the area on the left first metatarsal head. This was apparently debrided. He was given mupirocin and oral antibiotics. I do not believe he is offloading this at all. Readmission: 04/02/2019 upon evaluation today patient actually appears for reevaluation here in our clinic secondary to issues that he is having with his left lower extremity. He has been tolerating the dressing changes without complication. Fortunately there is no signs of active infection at this time. No fevers, chills, nausea, vomiting, or diarrhea. He has been in the hospital since we last saw him and then subsequently transferred back to [facility]. He tells me that they have been changing his dressing once a day and that he has had a significant amount of pain secondary to undergoing these dressing changes. Fortunately there is no evidence of active infection at this point which is good news. No fevers, chills, nausea, vomiting, or diarrhea. With that being said he wonders if we can go back to doing what we were doing before which was when he actually had the dressing changes performed once a week here in the clinic he tells me that he did very well with that. At that point he tolerated a 3 layer compression wrap which was approved by vascular, Dr. Tyrell Antonio office, to be used previous although  4-layer compression wrap is probably too much for him. 04/16/2019 on evaluation today patient appears to be doing decently well with regard to his lower extremity although he has had a lot of drainage from his leg. A lot of this I think is just secondary to the lymphedema  with that being said he does have some odor as well which may again just be due to more of the excessive moisture although I think that he may also have something that is a result of bacteria again he has somewhat of a Pseudomonas-like smell to the drainage and fortunately though he is not showing any signs of severe cellulitis he is still experiencing quite a bit of drainage and the discoloration is somewhat of a yellow/green in color which is consistent with Pseudomonas. If he can tolerated I think he may do well with Cipro. 04/24/2019 on evaluation today patient appears to be doing well with regard to his wound in the gluteal region which is healing quite well. That is excellent news. With that being said he is also doing better with regard to his left lower extremity overall I feel like things are definitely showing signs of improvement. 12/28-Patient returns with regards to his gluteal wound that is improving, Left lower leg wounds with significant edema in the lower legs that appear to be persistent. Patient is in compression and has lymphedema pumps at home but apparently he does not use consistently 05/07/2019 Upon inspection patient's wound bed actually showed signs on evaluation today patient's wound currently showed signs of improvement. Steven Meza, Steven Meza ([id number]) There is still a lot of maceration but it does look like that there is been some of this maceration occurring as a result of drainage in particular and some as a result of the fact that they have been putting bag balm on the wound area as well as the leg in general. I think this is applying too much moisture and not doing him any good. I discussed with the  patient and his wife today that I think that practice needs to be adjusted I would recommend using a different type of lotion in order to help with moisture around the wound but I would not recommend anything directly on the wound itself. 05/14/2019 on evaluation today patient actually appears to be doing much better in regard to his lower extremity. They have not been using any of the bag balm on the area I think this has made a tremendous improvement overall in his wounds. He has a lot of new skin growth and though there still are a lot of open wounds scattered throughout the circumferential lower leg this overall seems to be doing much better to me. 05/21/2019 upon evaluation today patient appears to be doing well in regard to his lower extremity although he is having unfortunately some blue-green drainage noted at this point. The does have me concerned for a reinitiation of the Pseudomonas infection that we previously treated in December. The last time I gave him a prescription was April 16, 2019. That was a little over a month ago. Nonetheless he does have more green drainage noted today and I think this is indicative of potentially a revving up infection. Obviously we do not want to get to that point. 05/28/2019 on evaluation today patient appears to be doing better with regard to his lower extremity at this point. Overall I am seeing signs of improvement which is good news. Fortunately there is no evidence of active infection at this time. I do believe that the Cipro has been beneficial for him that was prescribed last week for 14 days he is halfway through that and already appears to be doing much better. This is excellent news. 06/04/2019 upon evaluation today I do believe that the patient is doing better with  regard to his lower extremity. I feel like that he has much less in the way of open wounds and drainage and much in provement in regard to the overall weeping and even his swelling. There  is no signs of erythema no obvious evidence of active infection all of which is great news. No fevers, chills, nausea, vomiting, or diarrhea. 06/11/2019 on evaluation today patient appears to be doing better with regard to his lower extremity on the left. He has been tolerating dressing changes without complication. Fortunately there is no signs of active infection at this time. No fevers, chills, nausea, vomiting, or diarrhea. 06/18/2019 upon evaluation today patient appears to be doing slightly worse compared to last week. They did call me on Friday due to the fact that he had green discharge and both his wife as well as his home health nurse were concerned about what they were seeing. Subsequently I did actually send in a prescription for Cipro for 14 days for him and according to his wife this seems to be doing better based on what she is seeing today compared to Friday. This is great news. Home health is still coming out to see him on Wednesdays and Fridays as well. 06/25/2019 upon evaluation today patient appears to be doing somewhat better in regard to his leg ulcer. Fortunately there is no signs of active infection at this time which is good news. With that being said I am very pleased with how things seem to be going and overall I see no major issues here I think the alginate is doing well along with the XtraSorb. With regard to the patient's plantar foot he does have somewhat of a blood blister that appears to be resolving at this point his wife states that she feels like the issue is that he is not dropping his shoe in order to keep it from sliding when he walks. Subsequently this is leading to increases in friction which of course is leading to more issues with getting this area to heal the patient is struggling in that regard. 07/02/2019 upon evaluation today patient appears to be making some progress in regard to his lower extremities. With that being said I really feel like the alginate may  be getting too moist and then sitting on the skin causing some complications here for [place]. I discussed with the patient and his wife today as well the possibility of going ahead and discontinuing the alginate just utilizing the Xtrasorb to try to keep the edema under better control while at the same time preventing any dressings from staying too moist right on the surface of the wounds. Obviously I think this can do a good job in helping to dry things out but I would recommend that we give this a shot. Electronic Signature(s) Signed: 07/02/2019 2:25:37 PM By: Worthy Keeler PA-C Entered By: Worthy Keeler on 07/02/2019 14:25:37 Steven Meza ([id number]) -------------------------------------------------------------------------------- Physical Exam Details Patient Name: Steven Meza Date of Service: 07/02/2019 1:45 PM Medical Record Number: [id number] Patient Account Number: [id number] Date of Birth/Sex: 1931-04-12 (86 y.o. M) Treating RN: Primary Care Provider: Fulton Reek Other Clinician: Referring Provider: Fulton Reek Treating Provider/Extender: STONE III, Angel Hobdy Weeks in Treatment: 60 Constitutional Well-nourished and well-hydrated in no acute distress. Respiratory normal breathing without difficulty. Psychiatric this patient is able to make decisions and demonstrates good insight into disease process. Alert and Oriented x 3. pleasant and cooperative. Notes On inspection today patient continues to have significant edema and drainage from  his left lower extremity. Specifically I am concerned about the fact that he still is very macerated I think the alginate may be becoming too moist on the surface of his leg and then causing maceration and continued skin breakdown. I think that he may do better with trying to discontinue the alginate and just use the Xtrasorb to see if we can keep the drainage off of the skin more effectively. The patient and his wife are in agreement with the  plan. Electronic Signature(s) Signed: 07/02/2019 2:26:17 PM By: Worthy Keeler PA-C Entered By: Worthy Keeler on 07/02/2019 14:26:17 Steven Meza ([id number]) -------------------------------------------------------------------------------- Physician Orders Details Patient Name: Steven Meza Date of Service: 07/02/2019 1:45 PM Medical Record Number: [id number] Patient Account Number: [id number] Date of Birth/Sex: [birth year] (86 y.o. M) Treating RN: Army Melia Primary Care Provider: Fulton Reek Other Clinician: Referring Provider: Fulton Reek Treating Provider/Extender: Melburn Hake, Duwan Adrian Weeks in Treatment: 15 Verbal / Phone Orders: No Diagnosis Coding ICD-10 Coding Meza Description I89.0 Lymphedema, not elsewhere classified I87.2 Venous insufficiency (chronic) (peripheral) L97.822 Non-pressure chronic ulcer of other part of left lower leg with fat layer exposed I10 Essential (primary) hypertension I25.10 Atherosclerotic heart disease of native coronary artery without angina pectoris L84 Corns and callosities Wound Cleansing o Dial antibacterial soap, wash wounds, rinse and pat dry prior to dressing wounds o May shower with protection. Secondary Dressing o XtraSorb - over weeping areas Dressing Change Frequency o Change Dressing Monday, Wednesday, Friday - Monday in office, Wednesday and Friday by [facility] Follow-up Appointments o Return Appointment in 1 week. Edema Control o 3 Layer Compression System - Left [place] Visits - [facility] o Home Health Nurse may visit PRN to address patientos wound care needs. o FACE TO FACE ENCOUNTER: MEDICARE and MEDICAID PATIENTS: I certify that this patient is under my care and that I had a face-to- face encounter that meets the physician face-to-face encounter requirements with this patient on this date. The encounter with the patient was in whole or in part for the  following MEDICAL CONDITION: (primary reason for [place]) MEDICAL NECESSITY: I certify, that based on my findings, NURSING services are a medically necessary home health service. HOME BOUND STATUS: I certify that my clinical findings support that this patient is homebound (i.e., Due to illness or injury, pt requires aid of supportive devices such as crutches, cane, wheelchairs, walkers, the use of special transportation or the assistance of another person to leave their place of residence. There is a normal inability to leave the home and doing so requires considerable and taxing effort. Other absences are for medical reasons / religious services and are infrequent or of short duration when for other reasons). o If current dressing causes regression in wound condition, may D/C ordered dressing product/s and apply Normal Saline Moist Dressing daily until next [place] / Other MD appointment. [place] of regression in wound condition at [phone]. o Please direct any NON-WOUND related issues/requests for orders to patient's Primary Care Physician Electronic Signature(s) Signed: 07/02/2019 4:36:22 PM By: Army Melia Signed: 07/02/2019 4:51:53 PM By: Worthy Keeler PA-C Entered By: Army Melia on 07/02/2019 14:07:37 Steven Meza ([id number]) -------------------------------------------------------------------------------- Problem List Details Patient Name: Steven Meza Date of Service: 07/02/2019 1:45 PM Medical Record Number: [id number] Patient Account Number: [id number] Date of Birth/Sex: 05-30-1930 (86 y.o. M) Treating RN: Primary Care Provider: Fulton Reek Other Clinician: Referring Provider: Fulton Reek Treating Provider/Extender: Joaquim Lai  III, Imoni Kohen Weeks in Treatment: 13 Active Problems ICD-10 Evaluated Encounter Meza Description Active Date Today Diagnosis I89.0 Lymphedema, not elsewhere classified 04/02/2019 No Yes I87.2 Venous  insufficiency (chronic) (peripheral) 04/02/2019 No Yes L97.822 Non-pressure chronic ulcer of other part of left lower leg with fat layer 04/02/2019 No Yes exposed I10 Essential (primary) hypertension 04/02/2019 No Yes I25.10 Atherosclerotic heart disease of native coronary artery without angina 04/02/2019 No Yes pectoris L84 Corns and callosities 05/09/2019 No Yes Inactive Problems Resolved Problems Electronic Signature(s) Signed: 07/02/2019 2:03:43 PM By: Worthy Keeler PA-C Entered By: Worthy Keeler on 07/02/2019 14:03:42 Steven Meza ([id number]) -------------------------------------------------------------------------------- Progress Note Details Patient Name: Steven Meza Date of Service: 07/02/2019 1:45 PM Medical Record Number: [id number] Patient Account Number: [id number] Date of Birth/Sex: [birth year] (86 y.o. M) Treating RN: Primary Care Provider: Fulton Reek Other Clinician: Referring Provider: Fulton Reek Treating Provider/Extender: Melburn Hake, Keisa Blow Weeks in Treatment: 13 Subjective Chief Complaint Information obtained from Patient Left LE Ulcer History of Present Illness (HPI) ADMISSION 05/17/2018 Mr. Madray is an 86 year old man who is been treated at the lymphedema clinic for a long period with lymphedema wraps for bilateral lower extremity lymphedema. About 6 months ago they noted small open areas on his left lateral calf just above the ankle. These were open. They are apparently putting some form of ointment on this. They have been referred here for our review of this. The patient has a long history of lymphedema and venous stasis. It says in his records that he has recurrent cellulitis although his wife denies this but she states that the lymphedema may have started with cellulitis several years ago. Also in his record there is a history of bladder cancer which they seem to know very little about however he did not have any radiation to the pelvis or  anything that could have contributed to lymphedema that they are aware of. There is no prior wound history. The patient has 2 small punched out areas on the left lateral calf that have adherent debris on the surface. Past medical history; lymphedema, hypertension, cellulitis, hearing loss, morbid obesity, osteoarthritis, venous stasis, bladder CA, diverticulosis. ABIs in our clinic were 0.36 on the right and 0.57 on the left 05/24/2018; corrected age on this patient is 87 versus what I said last week. He has been for his arterial studies which predictably are not very good. On the right his ABI is 0.65. Monophasic waveforms noted at the right ankle including the posterior tibial and dorsalis pedis. He has triphasic waveforms of the common femoral and profunda femoris triphasic proximal SFA with monophasic distal FSA and popliteal artery. Monophasic tibial waveforms. On the left his ABI is 0.58 monophasic waveforms at the left ankle. Duplex of the left lower extremity demonstrated atherosclerotic change with monophasic waveforms throughout the left lower extremity. There was some concern about left iliac disease and potentially femoral-popliteal and tibial disease. The patient does not describe claudication although according to his wife he over emphasizes his activity. Patient states he is limited by pain in both knees His wounds are on the left lateral calf. 2 small areas with necrotic debris. We have been using silver collagen and light Ace wraps 05/31/2018; wounds on the left lateral calf in the setting of very significant lymphedema and probably PAD. We have been using silver collagen. The base of the small wound looks reasonably improved. I sent him to see Dr. Fletcher Anon however I see now he has an appointment with Dr. Alvester Chou on February  12 2/5; left lateral calf wound looks the same. His wife is doing a good job with her lymphedema wraps and maintaining that the edema. He most likely has PAD and is  due to see Dr. Gwenlyn Found of infectious disease on February 12 2/19; left lateral calf wounds look about the same. This looks like wound secondary to chronic venous insufficiency with lymphedema however the patient has very poor arterial studies. We referred him to Dr. Gwenlyn Found. Dr. Gwenlyn Found did not feel he needed to do anything from an arterial point of view. He did not address the question about the aggressiveness of compression. After some thoughts about this I elected to go ahead and put him in 3 layer compression which after all would be less compression then the lymphedema clinic was putting on this. I am hopeful that this should be enough to get some closure of the small wounds 2/26; left lateral calf wound letter this week. He tolerated the 3 layer compression well furthermore he is sleeping in a hospital bed which helps keep his legs up at night. The wound looks better measuring smaller especially in width 3/3; left lateral calf wound about the same size. The 3 layer compression has really helped get the edema down in his leg however. Using silver collagen 3/11; left lateral calf wound better looking wound surface but about the same size. We have been using 3 layer compression which is really helped get the edema down in the left leg. Even after Dr. Kennon Holter assessment I am reluctant to go to 4 layer compression. He is tolerating 3 layer compression well. We have been using silver collagen 3/18-Patient returns for his left lateral calf wound which overall is looking better, slightly increased in size and dimensions and area, he is tolerating the 3 layer compression, has been dressed with silver collagen which we will continue 3/25 left lateral calf wound much better looking. Size is smaller. He has been using silver collagen 4/1; gradually getting smaller in size. Using silver collagen with 3 layer compression. I think the patient has some degree of PAD. He saw Dr. Gwenlyn Found in consultation, he did not  specifically comment on whether he could tolerate 4 layer compression 4/8; the wound is slightly smaller in depth. We are using 3 layer compression with silver collagen. I had him seen for arterial insufficiency however his ABI was 0.57 in the left leg. Dr. Gwenlyn Found really did not seem to middle on the degree of compression he could tolerate 4/15; the wound is slightly smaller and slightly less deep. We have been using 3 layer compression with silver collagen. There is not an option for 4-layer compression here. 4/22; the patient is making decent progress on his wound on the left lateral calf however he arrives in with a superficial wound on the right anterior tibial area. Were not really sure how this happened. He had been using a stocking on the right leg. He has been using silver collagen on the wounds Steven Meza, Steven Meza ([id number]) Amazing to me he is he appears to have compression pumps at home. I am not really sure I knew this before. In any case he has not been using them 4/29; left lateral calf wound continues to get gradually smaller. This looks like it is on its way to closure. The area on the right anterior tibial area is about the same in terms of size superficial without any depth. This was probably wrap injury He tells me that he has been using his compression  pumps once a day for 1 hour without any pain 5/6; not as good today. Left lateral calf wound is actually larger and he does not have quite as good edema control. The area on the right anterior tibia is about the same still superficial. He states he is using his compression pumps once a day. We are using 3 layer compression on the left and Curlex Coban on the right 5/13; the area on the right is very close to being healed. The area on the left still has some depth but has a healthy wound surface. He has excellent edema control bilaterally. Using Hydrofera Blue 5/20-Patient returns in 1 week after being in 3 layer compression on the  left, the left lateral calf ulcer is stable with some slough, the right anterior tibial ulcer is healed we have been using Kerlix Coban on that leg 5/27; Hydrofera Blue and 3 layer compression. Still some slough in the wound area and some nonviable edges around the wound. 6/3; this patient has lymphedema and chronic venous insufficiency. He had difficult to close wound on his left lateral calf. This is closed today. 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 Finally he has known PAD but he is managed to tolerate the compression we reporting on him and also using his compression pumps that he already had at home twice a day 6/10; we discharge this patient last week having finally close the small area in the left lateral calf. Apparently by Friday of last week this had opened and was draining. He is therefore back in clinic. His wife who is present also was concerned about an area on the right anterior tibia."o 6/17; the patient does not have an open area on the right leg. On the left he has a small circular area anteriorly and the same area laterally that he had last time. However both of these appear to be improved he has his [place] wrap 30/40. It turns out that he is only using his external compression pumps once a day. I tried to get him up to twice a day today 6/24; the patient comes in with his external compression garment on the right leg/F[place] wrap. As far as we know no open wounds here. He has 2 areas that are smaller one on the left anterior pretibial and one on the left lateral calf which is the site of his initial wounds. I have successfully caught him into his external compression pumps twice a day 7/1; still using his external compression garment on the right leg. As far as we know there are no open wounds here. The area on the left lateral calf which was the site of his initial wound has closed over. The area on the left anterior pretibial that we  identified last week is still open. 7/8; as far as we know he has no open wounds on the right leg. He has his external compression garment on here. The original wound on the left lateral calf has closed over. He developed an area on the left anterior pretibial 2 weeks ago and seems to have 2 smaller areas on the proximal and distal lateral calf. We do not have his good edema control this week as I am used to seeing 7/15; the patient has still a linear area on the left anterior tibia. The area on the left lateral leg looked somewhat inflamed today but no cellulitis. He did have what looked to be a small pustule that I cultured  for CandS although I did not give him empiric antibiotics. 7/22; small area on the left anterior tibia. The area on the left lateral leg still looks inflamed. The pustule that I cultured last week grew Enterobacter and Pseudomonas. I prescribed Cipro they still have not picked that up. In any case the area actually looks fairly satisfactory. He has decent edema control using his compression pumps twice a day 7/29; the area on the left lateral leg looks a lot better than last week. Nothing is open here. He has an area just medial to the mid anterior tibia that is still open that is only open wound today. 8/5-Patient returns at 1 week for the left leg ulcer, which is actually looking better, he is also complaining of left plantar foot pain below the great toe. He does have a hematoma here 8/12-Patient returns at 1 week, the left leg open area looks about the same if not slightly better, the left plantar hematoma under the great toe is about the same, patient apparently has not been resting his foot on anything for too long period of time and does not wear the Birkenstock shoes unless he walks outdoors which he does not do often 8/19; small wound on the left anterior lower leg this is just about closed there is nothing on the left lateral leg. He is using his compression pumps He  arrives in clinic today with a black eschar over the right first plantar metatarsal head. This was apparently a bruise that was identified by her nurses last week. He wears crocs on his feet. He also may rub these on a footboard at home 8/26; there is nothing open on the left lateral lower leg he has a small area just medial to the tibia. He is going to see his podiatrist Dr. Milinda Pointer about the area on the left plantar first metatarsal head. He is using his external compression pumps. He has a juxta lite on the right leg 9/2; there is nothing open on either side of her left lower leg. He went to see podiatry about the area on the left first metatarsal head. This was apparently debrided. He was given mupirocin and oral antibiotics. I do not believe he is offloading this at all. Readmission: 04/02/2019 upon evaluation today patient actually appears for reevaluation here in our clinic secondary to issues that he is having with his left lower extremity. He has been tolerating the dressing changes without complication. Fortunately there is no signs of active infection at this time. No fevers, chills, nausea, vomiting, or diarrhea. He has been in the hospital since we last saw him and then subsequently transferred back to [facility]. He tells me that they have been changing his dressing once a day and that he has had a significant amount of pain secondary to undergoing these dressing changes. Fortunately there is no evidence of active infection at this point which is good news. No fevers, chills, nausea, vomiting, or diarrhea. With that being said he wonders if we can go back to doing what we were doing before which was when he actually had the dressing changes performed once a week here in the clinic he tells me that he did very well with that. At that point he tolerated a 3 layer compression wrap which was approved by vascular, Dr. Tyrell Antonio office, to be used previous although 4-layer compression wrap is  probably too much for him. 04/16/2019 on evaluation today patient appears to be doing decently well with regard to  his lower extremity although he has had a lot of drainage from his leg. A lot of this I think is just secondary to the lymphedema with that being said he does have some odor as well which may again just be due to more of the excessive moisture although I think that he may also have something that is a result of bacteria again he has somewhat of a Pseudomonas-like smell to the drainage and fortunately though he is not showing any signs of severe cellulitis he is still experiencing quite a bit of drainage and the discoloration is somewhat of a yellow/green in color which is consistent with Pseudomonas. If he can tolerated I think he may do well with Cipro. 04/24/2019 on evaluation today patient appears to be doing well with regard to his wound in the gluteal region which is healing quite well. That is excellent news. With that being said he is also doing better with regard to his left lower extremity overall I feel like things are definitely showing signs of improvement. Steven Meza, Steven Meza ([id number]) 12/28-Patient returns with regards to his gluteal wound that is improving, Left lower leg wounds with significant edema in the lower legs that appear to be persistent. Patient is in compression and has lymphedema pumps at home but apparently he does not use consistently 05/07/2019 Upon inspection patient's wound bed actually showed signs on evaluation today patient's wound currently showed signs of improvement. There is still a lot of maceration but it does look like that there is been some of this maceration occurring as a result of drainage in particular and some as a result of the fact that they have been putting bag balm on the wound area as well as the leg in general. I think this is applying too much moisture and not doing him any good. I discussed with the patient and his wife today that  I think that practice needs to be adjusted I would recommend using a different type of lotion in order to help with moisture around the wound but I would not recommend anything directly on the wound itself. 05/14/2019 on evaluation today patient actually appears to be doing much better in regard to his lower extremity. They have not been using any of the bag balm on the area I think this has made a tremendous improvement overall in his wounds. He has a lot of new skin growth and though there still are a lot of open wounds scattered throughout the circumferential lower leg this overall seems to be doing much better to me. 05/21/2019 upon evaluation today patient appears to be doing well in regard to his lower extremity although he is having unfortunately some blue-green drainage noted at this point. The does have me concerned for a reinitiation of the Pseudomonas infection that we previously treated in December. The last time I gave him a prescription was April 16, 2019. That was a little over a month ago. Nonetheless he does have more green drainage noted today and I think this is indicative of potentially a revving up infection. Obviously we do not want to get to that point. 05/28/2019 on evaluation today patient appears to be doing better with regard to his lower extremity at this point. Overall I am seeing signs of improvement which is good news. Fortunately there is no evidence of active infection at this time. I do believe that the Cipro has been beneficial for him that was prescribed last week for 14 days he is halfway through that  and already appears to be doing much better. This is excellent news. 06/04/2019 upon evaluation today I do believe that the patient is doing better with regard to his lower extremity. I feel like that he has much less in the way of open wounds and drainage and much in provement in regard to the overall weeping and even his swelling. There is no signs of erythema  no obvious evidence of active infection all of which is great news. No fevers, chills, nausea, vomiting, or diarrhea. 06/11/2019 on evaluation today patient appears to be doing better with regard to his lower extremity on the left. He has been tolerating dressing changes without complication. Fortunately there is no signs of active infection at this time. No fevers, chills, nausea, vomiting, or diarrhea. 06/18/2019 upon evaluation today patient appears to be doing slightly worse compared to last week. They did call me on Friday due to the fact that he had green discharge and both his wife as well as his home health nurse were concerned about what they were seeing. Subsequently I did actually send in a prescription for Cipro for 14 days for him and according to his wife this seems to be doing better based on what she is seeing today compared to Friday. This is great news. Home health is still coming out to see him on Wednesdays and Fridays as well. 06/25/2019 upon evaluation today patient appears to be doing somewhat better in regard to his leg ulcer. Fortunately there is no signs of active infection at this time which is good news. With that being said I am very pleased with how things seem to be going and overall I see no major issues here I think the alginate is doing well along with the XtraSorb. With regard to the patient's plantar foot he does have somewhat of a blood blister that appears to be resolving at this point his wife states that she feels like the issue is that he is not dropping his shoe in order to keep it from sliding when he walks. Subsequently this is leading to increases in friction which of course is leading to more issues with getting this area to heal the patient is struggling in that regard. 07/02/2019 upon evaluation today patient appears to be making some progress in regard to his lower extremities. With that being said I really feel like the alginate may be getting too moist and  then sitting on the skin causing some complications here for [place]. I discussed with the patient and his wife today as well the possibility of going ahead and discontinuing the alginate just utilizing the Xtrasorb to try to keep the edema under better control while at the same time preventing any dressings from staying too moist right on the surface of the wounds. Obviously I think this can do a good job in helping to dry things out but I would recommend that we give this a shot. Objective Constitutional Well-nourished and well-hydrated in no acute distress. Vitals Time Taken: 1:54 PM, Height: 65 in, Weight: 273 lbs, BMI: 45.4, Temperature: 97.7 F, Pulse: 76 bpm, Respiratory Rate: 18 breaths/min, Blood Pressure: 158/69 mmHg. Respiratory normal breathing without difficulty. Psychiatric this patient is able to make decisions and demonstrates good insight into disease process. Alert and Oriented x 3. pleasant and cooperative. Steven Meza, Steven Meza ([id number]) General Notes: On inspection today patient continues to have significant edema and drainage from his left lower extremity. Specifically I am concerned about the fact that he still is very  macerated I think the alginate may be becoming too moist on the surface of his leg and then causing maceration and continued skin breakdown. I think that he may do better with trying to discontinue the alginate and just use the Xtrasorb to see if we can keep the drainage off of the skin more effectively. The patient and his wife are in agreement with the plan. Other Condition(s) Patient presents with Lymphedema located on the Left Leg. Assessment Active Problems ICD-10 Lymphedema, not elsewhere classified Venous insufficiency (chronic) (peripheral) Non-pressure chronic ulcer of other part of left lower leg with fat layer exposed Essential (primary) hypertension Atherosclerotic heart disease of native coronary artery without angina pectoris Corns and  callosities Procedures There was a Three Layer Compression Therapy Procedure by Army Melia, RN. Post procedure Diagnosis Wound #: Same as Pre-Procedure Plan Wound Cleansing: Dial antibacterial soap, wash wounds, rinse and pat dry prior to dressing wounds May shower with protection. Secondary Dressing: XtraSorb - over weeping areas Dressing Change Frequency: Change Dressing Monday, Wednesday, Friday - Monday in office, Wednesday and Friday by [name] Follow-up Appointments: Return Appointment in 1 week. Edema Control: 3 Layer Compression System - Left Lower Extremity Home Health: [place] Nurse may visit PRN to address patient s wound care needs. FACE TO FACE ENCOUNTER: MEDICARE and MEDICAID PATIENTS: I certify that this patient is under my care and that I had a face-to-face encounter that meets the physician face-to-face encounter requirements with this patient on this date. The encounter with the patient was in whole or in part for the following MEDICAL CONDITION: (primary reason for [place]) MEDICAL NECESSITY: I certify, that based on my findings, NURSING services are a medically necessary home health service. HOME BOUND STATUS: I certify that my clinical findings support that this patient is homebound (i.e., Due to illness or injury, pt requires aid of supportive devices such as crutches, cane, wheelchairs, walkers, the use of special transportation or the assistance of another person to leave their place of residence. There is a normal inability to leave the home and doing so requires considerable and taxing effort. Other absences are for medical reasons / religious services and are infrequent or of short duration when for other reasons). If current dressing causes regression in wound condition, may D/C ordered dressing product/s and apply Normal Saline Moist Dressing daily until next [place] / Other MD appointment. [place] of regression in wound condition at [phone]. Please direct any NON-WOUND related issues/requests for orders to patient's Primary Care Physician Steven Meza, Steven Meza ([id number]) 1. My suggestion currently is good to be that we go ahead and initiate treatment with a discontinuation of the alginate tissues and Xtrasorb over the open draining areas at this point and the patient and his wife are in agreement with that plan. 2. Would recommend as well that we continue with 3 layer compression wrap that I do feel like it is helping him at this point. 3. I do recommend the patient also elevate his legs and I very specifically discussed with him today that he does need to see about trying to elevate his legs at all times when he can in order to avoid any breakdown or issues at this point. This will help in general control the edema much more effectively. The patient states that everything else that I recommended up to this point has worked so he will give this a try. We will see patient back for  reevaluation in 1 week here in the clinic. If anything worsens or changes patient will contact our office for additional recommendations. Electronic Signature(s) Signed: 07/02/2019 2:27:26 PM By: Worthy Keeler PA-C Entered By: Worthy Keeler on 07/02/2019 14:27:26 Steven Meza ([id number]) -------------------------------------------------------------------------------- SuperBill Details Patient Name: Steven Meza Date of Service: 07/02/2019 Medical Record Number: [id number] Patient Account Number: [id number] Date of Birth/Sex: 1931-02-11 (86 y.o. M) Treating RN: Army Melia Primary Care Provider: Fulton Reek Other Clinician: Referring Provider: Fulton Reek Treating Provider/Extender: Melburn Hake, Micharl Helmes Weeks in Treatment: 13 Diagnosis Coding ICD-10 Codes Meza Description I89.0 Lymphedema, not elsewhere classified I87.2 Venous insufficiency (chronic) (peripheral) L97.822  Non-pressure chronic ulcer of other part of left lower leg with fat layer exposed Twain (primary) hypertension I25.10 Atherosclerotic heart disease of native coronary artery without angina pectoris L84 Corns and callosities Facility Procedures CPT4 Meza: [id number] Description: (Facility Use Only) [id number] - [place] LWR LT LEG Modifier: Quantity: 1 Physician Procedures CPT4 [id number] Description: [id number] - WC PHYS LEVEL 3 - EST PT Modifier: Quantity: 1 CPT4 Meza: Description: ICD-10 Diagnosis Description I89.0 Lymphedema, not elsewhere classified I87.2 Venous insufficiency (chronic) (peripheral) 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

## 2019-07-06 NOTE — Progress Notes (Signed)
IRA, YOUNGHANS ([id number]) Visit Report for 06/25/2019 Arrival Information Details Patient Name: Steven Meza, Steven Meza Date of Service: 06/25/2019 1:45 PM Medical Record Number: [id number] Patient Account Number: [id number] Date of Birth/Sex: 1931/04/26 (86 y.o. M) Treating RN: Cornell Barman Primary Care Jone Panebianco: Fulton Reek Other Clinician: Referring Ivianna Notch: Fulton Reek Treating Orley Lawry/Extender: Melburn Hake, HOYT Weeks in Treatment: 12 Visit Information History Since Last Visit Added or deleted any medications: No Patient Arrived: [place] Any new allergies or adverse reactions: No Arrival Time: 13:46 Had a fall or experienced change in No Accompanied By: wife activities of daily living that may affect Transfer Assistance: None risk of falls: Patient Identification Verified: Yes Signs or symptoms of abuse/neglect since last visito No Secondary Verification Process Completed: Yes Hospitalized since last visit: No Implantable device outside of the clinic excluding No cellular tissue based products placed in the center since last visit: Has Dressing in Place as Prescribed: Yes Has Compression in Place as Prescribed: Yes Pain Present Now: No Electronic Signature(s) Signed: 06/25/2019 4:34:46 PM By: Lorine Bears RCP, RRT, CHT Entered By: Lorine Bears on 06/25/2019 13:47:26 Steven Meza ([id number]) -------------------------------------------------------------------------------- Compression Therapy Details Patient Name: Steven Meza Date of Service: 06/25/2019 1:45 PM Medical Record Number: [id number] Patient Account Number: [id number] Date of Birth/Sex: 11-21-1930 (86 y.o. M) Treating RN: Army Melia Primary Care Stefannie Defeo: Fulton Reek Other Clinician: Referring Itzelle Gains: Fulton Reek Treating Skyy Mcknight/Extender: Melburn Hake, HOYT Weeks in Treatment: 12 Compression Therapy Performed for Wound Assessment: NonWound Condition Lymphedema  - Left Leg Performed By: Clinician Army Melia, RN Compression Type: Three Layer Post Procedure Diagnosis Same as Pre-procedure Electronic Signature(s) Signed: 06/25/2019 4:40:14 PM By: Army Melia Entered By: Army Melia on 06/25/2019 14:31:06 Steven Meza ([id number]) -------------------------------------------------------------------------------- Encounter Discharge Information Details Patient Name: Steven Meza Date of Service: 06/25/2019 1:45 PM Medical Record Number: [id number] Patient Account Number: [id number] Date of Birth/Sex: [birth year] (87 y.o. M) Treating RN: Army Melia Primary Care Yecheskel Kurek: Fulton Reek Other Clinician: Referring Velora Horstman: Fulton Reek Treating Averiana Clouatre/Extender: Melburn Hake, HOYT Weeks in Treatment: 12 Encounter Discharge Information Items Discharge Condition: Stable Ambulatory Status: Ambulatory Discharge Destination: Home Transportation: Private Auto Accompanied By: wife Schedule Follow-up Appointment: Yes Clinical Summary of Care: Electronic Signature(s) Signed: 06/25/2019 4:40:14 PM By: Army Melia Entered By: Army Melia on 06/25/2019 14:36:17 Steven Meza ([id number]) -------------------------------------------------------------------------------- Lower Extremity Assessment Details Patient Name: Steven Meza Date of Service: 06/25/2019 1:45 PM Medical Record Number: [id number] Patient Account Number: [id number] Date of Birth/Sex: 02/10/1931 (86 y.o. M) Treating RN: Cornell Barman Primary Care Joellyn Grandt: Fulton Reek Other Clinician: Referring Afifa Truax: Fulton Reek Treating Rocklyn Mayberry/Extender: Melburn Hake, HOYT Weeks in Treatment: 12 Edema Assessment Assessed: [Left: No] [Right: No] [Left: Edema] [Right: :] Calf Left: Right: Point of Measurement: 33 cm From Medial Instep 43.5 cm cm Ankle Left: Right: Point of Measurement: 12 cm From Medial Instep 29.2 cm cm Vascular Assessment Pulses: Dorsalis  Pedis Palpable: [Left:Yes] Electronic Signature(s) Signed: 07/06/2019 5:40:44 PM By: Gretta Cool, BSN, RN, CWS, Kim RN, BSN Entered By: Gretta Cool, BSN, RN, CWS, Kim on 06/25/2019 13:59:17 Steven Meza ([id number]) -------------------------------------------------------------------------------- Multi Wound Chart Details Patient Name: Steven Meza Date of Service: 06/25/2019 1:45 PM Medical Record Number: [id number] Patient Account Number: [id number] Date of Birth/Sex: [birth year] (86 y.o. M) Treating RN: Army Melia Primary Care Kemal Amores: Fulton Reek Other Clinician: Referring Adarius Tigges: Fulton Reek Treating Antario Yasuda/Extender: Melburn Hake, HOYT Weeks in Treatment: 12 Vital Signs Height(in): 65 Pulse(bpm): 26 Weight(lbs): [id number] Blood Pressure(mmHg): 151/57 Body Mass Index(BMI): 45  Temperature(F): 98.3 Respiratory Rate(breaths/min): 18 Photos: [6:No Photos] [N/A:N/A] Wound Location: [6:Left Lower Leg - Circumferential] [N/A:N/A] Wounding Event: [6:Gradually Appeared] [N/A:N/A] Primary Etiology: [6:Venous Leg Ulcer] [N/A:N/A] Comorbid History: [6:Lymphedema, Coronary Artery Disease, Hypertension] [N/A:N/A] Date Acquired: [6:03/02/2019] [N/A:N/A] Weeks of Treatment: [6:12] [N/A:N/A] Wound Status: [6:Converted] [N/A:N/A] Classification: [6:Partial Thickness] [N/A:N/A] Exudate Amount: [6:Medium] [N/A:N/A] Exudate Type: [6:Serous] [N/A:N/A] Exudate Color: [6:amber] [N/A:N/A] Wound Margin: [6:Indistinct, nonvisible] [N/A:N/A] Granulation Amount: [6:Large (67-100%)] [N/A:N/A] Granulation Quality: [6:Pink] [N/A:N/A] Necrotic Amount: [6:Small (1-33%)] [N/A:N/A] Exposed Structures: [6:Fat Layer (Subcutaneous Tissue) Exposed: Yes Fascia: No Tendon: No Muscle: No Joint: No Bone: No Medium (34-66%)] [N/A:N/A N/A] Treatment Notes Electronic Signature(s) Signed: 06/25/2019 4:40:14 PM By: Army Melia Entered By: Army Melia on 06/25/2019 14:28:57 Steven Meza  ([id number]) -------------------------------------------------------------------------------- [facility] Details Patient Name: Steven Meza Date of Service: 06/25/2019 1:45 PM Medical Record Number: [id number] Patient Account Number: [id number] Date of Birth/Sex: 04/24/1931 (86 y.o. M) Treating RN: Army Melia Primary Care Mohmmad Saleeby: Fulton Reek Other Clinician: Referring Sherah Lund: Fulton Reek Treating Sherial Ebrahim/Extender: Melburn Hake, HOYT Weeks in Treatment: 12 Active Inactive Abuse / Safety / Falls / Self Care Management Nursing Diagnoses: Potential for falls Goals: Patient will remain injury free related to falls Date Initiated: 04/24/2019 Target Resolution Date: 07/14/2019 Goal Status: Active Interventions: Assess fall risk on admission and as needed Notes: Venous Leg Ulcer Nursing Diagnoses: Actual venous Insuffiency (use after diagnosis is confirmed) Goals: Patient will maintain optimal edema control Date Initiated: 04/24/2019 Target Resolution Date: 07/14/2019 Goal Status: Active Interventions: Compression as ordered Notes: Electronic Signature(s) Signed: 06/25/2019 4:40:14 PM By: Army Melia Entered By: Army Melia on 06/25/2019 14:28:06 Steven Meza ([id number]) -------------------------------------------------------------------------------- Non-Wound Condition Assessment Details Patient Name: Steven Meza Date of Service: 06/25/2019 1:45 PM Medical Record Number: [id number] Patient Account Number: [id number] Date of Birth/Sex: [birth year] (86 y.o. M) Treating RN: Cornell Barman Primary Care Mikylah Ackroyd: Fulton Reek Other Clinician: Referring Brennah Quraishi: Fulton Reek Treating Addiel Mccardle/Extender: Melburn Hake, HOYT Weeks in Treatment: 12 Non-Wound Condition: Condition: Lymphedema Location: Leg Side: Left Photos Electronic Signature(s) Signed: 07/06/2019 5:40:44 PM By: Gretta Cool, BSN, RN, CWS, Kim RN, BSN Entered By: Gretta Cool, BSN, RN, CWS,  Kim on 06/25/2019 14:02:05 Steven Meza ([id number]) -------------------------------------------------------------------------------- Pain Assessment Details Patient Name: Steven Meza Date of Service: 06/25/2019 1:45 PM Medical Record Number: [id number] Patient Account Number: [id number] Date of Birth/Sex: 09-08-1930 (86 y.o. M) Treating RN: Cornell Barman Primary Care Yousaf Sainato: Fulton Reek Other Clinician: Referring Keltin Baird: Fulton Reek Treating Taeshaun Rames/Extender: Melburn Hake, HOYT Weeks in Treatment: 12 Active Problems Location of Pain Severity and Description of Pain Patient Has Paino No Site Locations Pain Management and Medication Current Pain Management: Notes Patient denies pain at this time. Electronic Signature(s) Signed: 07/06/2019 5:40:44 PM By: Gretta Cool, BSN, RN, CWS, Kim RN, BSN Entered By: Gretta Cool, BSN, RN, CWS, Kim on 06/25/2019 13:57:05 Steven Meza ([id number]) -------------------------------------------------------------------------------- Patient/Caregiver Education Details Patient Name: Steven Meza Date of Service: 06/25/2019 1:45 PM Medical Record Number: [id number] Patient Account Number: [id number] Date of Birth/Gender: 1931-01-04 (86 y.o. M) Treating RN: Army Melia Primary Care Physician: Fulton Reek Other Clinician: Referring Physician: Fulton Reek Treating Physician/Extender: Sharalyn Ink in Treatment: 12 Education Assessment Education Provided To: Patient Education Topics Provided Wound/Skin Impairment: Handouts: Caring for Your Ulcer Methods: Demonstration, Explain/Verbal Responses: State content correctly Electronic Signature(s) Signed: 06/25/2019 4:40:14 PM By: Army Melia Entered By: Army Melia on 06/25/2019 14:35:38 Steven Meza ([id number]) -------------------------------------------------------------------------------- Wound Assessment Details Patient Name: Steven Meza Date of Service:  06/25/2019  1:45 PM Medical Record Number: [id number] Patient Account Number: [id number] Date of Birth/Sex: [birth year] (86 y.o. M) Treating RN: Cornell Barman Primary Care Gaddiel Cullens: Fulton Reek Other Clinician: Referring Harce Volden: Fulton Reek Treating Franke Menter/Extender: Melburn Hake, HOYT Weeks in Treatment: 12 Wound Status Wound Number: 6 Primary Etiology: Venous Leg Ulcer Wound Location: Left Lower Leg - Circumferential Wound Status: Converted Wounding Event: Gradually Appeared Comorbid History: Lymphedema, Coronary Artery Disease, Hypertension Date Acquired: 03/02/2019 Weeks Of Treatment: 12 Clustered Wound: No Photos Photo Uploaded By: Gretta Cool, BSN, RN, CWS, Kim on 06/25/2019 15:20:10 Wound Measurements % Reduction in Area: % Reduction in Volume: Epithelialization: Medium (34-66%) Wound Description Classification: Partial Thickness Wound Margin: Indistinct, nonvisible Exudate Amount: Medium Exudate Type: Serous Exudate Color: amber Foul Odor After Cleansing: No Slough/Fibrino Yes Wound Bed Granulation Amount: Large (67-100%) Exposed Structure Granulation Quality: Pink Fascia Exposed: No Necrotic Amount: Small (1-33%) Fat Layer (Subcutaneous Tissue) Exposed: Yes Necrotic Quality: Adherent Slough Tendon Exposed: No Muscle Exposed: No Joint Exposed: No Bone Exposed: No Electronic Signature(s) Signed: 07/06/2019 5:40:44 PM By: Gretta Cool, BSN, RN, CWS, Kim RN, BSN Entered By: Gretta Cool, BSN, RN, CWS, Kim on 06/25/2019 14:01:09 Steven Meza ([id number]) -------------------------------------------------------------------------------- Vitals Details Patient Name: Steven Meza Date of Service: 06/25/2019 1:45 PM Medical Record Number: [id number] Patient Account Number: [id number] Date of Birth/Sex: 1930-06-02 (86 y.o. M) Treating RN: Cornell Barman Primary Care Chavez Rosol: Fulton Reek Other Clinician: Referring Makana Feigel: Fulton Reek Treating Rafal Archuleta/Extender: Melburn Hake,  HOYT Weeks in Treatment: 12 Vital Signs Time Taken: 13:48 Temperature (F): 98.3 Height (in): 65 Pulse (bpm): 73 Weight (lbs): 273 Respiratory Rate (breaths/min): 18 Body Mass Index (BMI): 45.4 Blood Pressure (mmHg): 151/57 Reference Range: 80 - 120 mg / dl Electronic Signature(s) Signed: 06/25/2019 4:34:46 PM By: Lorine Bears RCP, RRT, CHT Entered By: Lorine Bears on 06/25/2019 13:50:27

## 2019-07-09 ENCOUNTER — Encounter: Payer: Medicare Other | Admitting: Physician Assistant

## 2019-07-09 ENCOUNTER — Other Ambulatory Visit: Payer: Self-pay

## 2019-07-09 DIAGNOSIS — L97822 Non-pressure chronic ulcer of other part of left lower leg with fat layer exposed: Secondary | ICD-10-CM | POA: Diagnosis not present

## 2019-07-09 NOTE — Progress Notes (Addendum)
SAWYER, SUTHERBY ([id number]) Visit Report for 07/09/2019 Chief Complaint Document Details Patient Name: Steven Meza, Steven Meza Date of Service: 07/09/2019 1:45 PM Medical Record Number: [id number] Patient Account Number: [id number] Date of Birth/Sex: Dec 26, 1930 (86 y.o. M) Treating RN: Primary Care Provider: Fulton Reek Other Clinician: Referring Provider: Fulton Reek Treating Provider/Extender: Melburn Hake, HOYT Weeks in Treatment: 14 Information Obtained from: Patient Chief Complaint Left LE Ulcer Electronic Signature(s) Signed: 07/09/2019 2:34:31 PM By: Worthy Keeler PA-C Entered By: Worthy Keeler on 07/09/2019 14:34:30 Steven Meza ([id number]) -------------------------------------------------------------------------------- HPI Details Patient Name: Steven Meza Date of Service: 07/09/2019 1:45 PM Medical Record Number: [id number] Patient Account Number: [id number] Date of Birth/Sex: 1930/12/05 (86 y.o. M) Treating RN: Primary Care Provider: Fulton Reek Other Clinician: Referring Provider: Fulton Reek Treating Provider/Extender: Melburn Hake, HOYT Weeks in Treatment: 14 History of Present Illness HPI Description: ADMISSION 05/17/2018 Steven Meza is an 86 year old man who is been treated at the lymphedema clinic for a long period with lymphedema wraps for bilateral lower extremity lymphedema. About 6 months ago they noted small open areas on his left lateral calf just above the ankle. These were open. They are apparently putting some form of ointment on this. They have been referred here for our review of this. The patient has a long history of lymphedema and venous stasis. It says in his records that he has recurrent cellulitis although his wife denies this but she states that the lymphedema may have started with cellulitis several years ago. Also in his record there is a history of bladder cancer which they seem to know very little about however he did not have any  radiation to the pelvis or anything that could have contributed to lymphedema that they are aware of. There is no prior wound history. The patient has 2 small punched out areas on the left lateral calf that have adherent debris on the surface. Past medical history; lymphedema, hypertension, cellulitis, hearing loss, morbid obesity, osteoarthritis, venous stasis, bladder CA, diverticulosis. ABIs in our clinic were 0.36 on the right and 0.57 on the left 05/24/2018; corrected age on this patient is 71 versus what I said last week. He has been for his arterial studies which predictably are not very good. On the right his ABI is 0.65. Monophasic waveforms noted at the right ankle including the posterior tibial and dorsalis pedis. He has triphasic waveforms of the common femoral and profunda femoris triphasic proximal SFA with monophasic distal FSA and popliteal artery. Monophasic tibial waveforms. On the left his ABI is 0.58 monophasic waveforms at the left ankle. Duplex of the left lower extremity demonstrated atherosclerotic change with monophasic waveforms throughout the left lower extremity. There was some concern about left iliac disease and potentially femoral-popliteal and tibial disease. The patient does not describe claudication although according to his wife he over emphasizes his activity. Patient states he is limited by pain in both knees His wounds are on the left lateral calf. 2 small areas with necrotic debris. We have been using silver collagen and light Ace wraps 05/31/2018; wounds on the left lateral calf in the setting of very significant lymphedema and probably PAD. We have been using silver collagen. The base of the small wound looks reasonably improved. I sent him to see Dr. Fletcher Anon however I see now he has an appointment with Dr. Alvester Chou on February 12 2/5; left lateral calf wound looks the same. His wife is doing a good job with her lymphedema wraps and maintaining that the edema.  He  most likely has PAD and is due to see Dr. Gwenlyn Found of infectious disease on February 12 2/19; left lateral calf wounds look about the same. This looks like wound secondary to chronic venous insufficiency with lymphedema however the patient has very poor arterial studies. We referred him to Dr. Gwenlyn Found. Dr. Gwenlyn Found did not feel he needed to do anything from an arterial point of view. He did not address the question about the aggressiveness of compression. After some thoughts about this I elected to go ahead and put him in 3 layer compression which after all would be less compression then the lymphedema clinic was putting on this. I am hopeful that this should be enough to get some closure of the small wounds 2/26; left lateral calf wound letter this week. He tolerated the 3 layer compression well furthermore he is sleeping in a hospital bed which helps keep his legs up at night. The wound looks better measuring smaller especially in width 3/3; left lateral calf wound about the same size. The 3 layer compression has really helped get the edema down in his leg however. Using silver collagen 3/11; left lateral calf wound better looking wound surface but about the same size. We have been using 3 layer compression which is really helped get the edema down in the left leg. Even after Dr. Kennon Holter assessment I am reluctant to go to 4 layer compression. He is tolerating 3 layer compression well. We have been using silver collagen 3/18-Patient returns for his left lateral calf wound which overall is looking better, slightly increased in size and dimensions and area, he is tolerating the 3 layer compression, has been dressed with silver collagen which we will continue 3/25oleft lateral calf wound much better looking. Size is smaller. He has been using silver collagen 4/1; gradually getting smaller in size. Using silver collagen with 3 layer compression. I think the patient has some degree of PAD. He saw Dr. Gwenlyn Found  in consultation, he did not specifically comment on whether he could tolerate 4 layer compression 4/8; the wound is slightly smaller in depth. We are using 3 layer compression with silver collagen. I had him seen for arterial insufficiency however his ABI was 0.57 in the left leg. Dr. Gwenlyn Found really did not seem to middle on the degree of compression he could tolerate 4/15; the wound is slightly smaller and slightly less deep. We have been using 3 layer compression with silver collagen. There is not an option for 4-layer compression here. 4/22; the patient is making decent progress on his wound on the left lateral calf however he arrives in with a superficial wound on the right anterior tibial area. Were not really sure how this happened. He had been using a stocking on the right leg. He has been using silver collagen on the wounds Amazing to me he is he appears to have compression pumps at home. I am not really sure I knew this before. In any case he has not been using them 4/29; left lateral calf wound continues to get gradually smaller. This looks like it is on its way to closure. oThe area on the right anterior tibial area is about the same in terms of size superficial without any depth. This was probably wrap injury oHe tells me that he has been using his compression pumps once a day for 1 hour without any pain REGGINALD, MOSCOSO ([id number]) 5/6; not as good today. Left lateral calf wound is actually larger and he does not  have quite as good edema control. The area on the right anterior tibia is about the same still superficial. He states he is using his compression pumps once a day. We are using 3 layer compression on the left and Curlex Coban on the right 5/13; the area on the right is very close to being healed. The area on the left still has some depth but has a healthy wound surface. He has excellent edema control bilaterally. Using Hydrofera Blue 5/20-Patient returns in 1 week after being  in 3 layer compression on the left, the left lateral calf ulcer is stable with some slough, the right anterior tibial ulcer is healed we have been using Kerlix Coban on that leg 5/27; Hydrofera Blue and 3 layer compression. Still some slough in the wound area and some nonviable edges around the wound. 6/3; this patient has lymphedema and chronic venous insufficiency. He had difficult to close wound on his left lateral calf. This is closed today. 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 Finally he has known PAD but he is managed to tolerate the compression we reporting on him and also using his compression pumps that he already had at home twice a day 6/10; we discharge this patient last week having finally close the small area in the left lateral calf. Apparently by Friday of last week this had opened and was draining. He is therefore back in clinic. His wife who is present also was concerned about an area on the right anterior tibia."o 6/17; the patient does not have an open area on the right leg. On the left he has a small circular area anteriorly and the same area laterally that he had last time. However both of these appear to be improved he has his [place]arrow wrap 30/40. It turns out that he is only using his external compression pumps once a day. I tried to get him up to twice a day today 6/24; the patient comes in with his external compression garment on the right leg/Farrow wrap. As far as we know no open wounds here. He has 2 areas that are smaller one on the left anterior pretibial and one on the left lateral calf which is the site of his initial wounds. I have successfully caught him into his external compression pumps twice a day 7/1; still using his external compression garment on the right leg. As far as we know there are no open wounds here. The area on the left lateral calf which was the site of his initial wound has closed over. The area on the left  anterior pretibial that we identified last week is still open. 7/8; as far as we know he has no open wounds on the right leg. He has his external compression garment on here. The original wound on the left lateral calf has closed over. He developed an area on the left anterior pretibial 2 weeks ago and seems to have 2 smaller areas on the proximal and distal lateral calf. We do not have his good edema control this week as I am used to seeing 7/15; the patient has still a linear area on the left anterior tibia. The area on the left lateral leg looked somewhat inflamed today but no cellulitis. He did have what looked to be a small pustule that I cultured for CandS although I did not give him empiric antibiotics. 7/22; small area on the left anterior tibia. The area on the left lateral leg still  looks inflamed. The pustule that I cultured last week grew Enterobacter and Pseudomonas. I prescribed Cipro they still have not picked that up. In any case the area actually looks fairly satisfactory. He has decent edema control using his compression pumps twice a day 7/29; the area on the left lateral leg looks a lot better than last week. Nothing is open here. He has an area just medial to the mid anterior tibia that is still open that is only open wound today. 8/5-Patient returns at 1 week for the left leg ulcer, which is actually looking better, he is also complaining of left plantar foot pain below the great toe. He does have a hematoma here 8/12-Patient returns at 1 week, the left leg open area looks about the same if not slightly better, the left plantar hematoma under the great toe is about the same, patient apparently has not been resting his foot on anything for too long period of time and does not wear the Birkenstock shoes unless he walks outdoors which he does not do often 8/19; small wound on the left anterior lower leg this is just about closed there is nothing on the left lateral leg. He is using  his compression pumps He arrives in clinic today with a black eschar over the right first plantar metatarsal head. This was apparently a bruise that was identified by her nurses last week. He wears crocs on his feet. He also may rub these on a footboard at home 8/26; there is nothing open on the left lateral lower leg he has a small area just medial to the tibia. He is going to see his podiatrist Dr. Milinda Pointer about the area on the left plantar first metatarsal head. He is using his external compression pumps. He has a juxta lite on the right leg 9/2; there is nothing open on either side of her left lower leg. He went to see podiatry about the area on the left first metatarsal head. This was apparently debrided. He was given mupirocin and oral antibiotics. I do not believe he is offloading this at all. Readmission: 04/02/2019 upon evaluation today patient actually appears for reevaluation here in our clinic secondary to issues that he is having with his left lower extremity. He has been tolerating the dressing changes without complication. Fortunately there is no signs of active infection at this time. No fevers, chills, nausea, vomiting, or diarrhea. He has been in the hospital since we last saw him and then subsequently transferred back to [facility]. He tells me that they have been changing his dressing once a day and that he has had a significant amount of pain secondary to undergoing these dressing changes. Fortunately there is no evidence of active infection at this point which is good news. No fevers, chills, nausea, vomiting, or diarrhea. With that being said he wonders if we can go back to doing what we were doing before which was when he actually had the dressing changes performed once a week here in the clinic he tells me that he did very well with that. At that point he tolerated a 3 layer compression wrap which was approved by vascular, Dr. Tyrell Antonio office, to be used previous although  4-layer compression wrap is probably too much for him. 04/16/2019 on evaluation today patient appears to be doing decently well with regard to his lower extremity although he has had a lot of drainage from his leg. A lot of this I think is just secondary to the lymphedema  with that being said he does have some odor as well which may again just be due to more of the excessive moisture although I think that he may also have something that is a result of bacteria again he has somewhat of a Pseudomonas-like smell to the drainage and fortunately though he is not showing any signs of severe cellulitis he is still experiencing quite a bit of drainage and the discoloration is somewhat of a yellow/green in color which is consistent with Pseudomonas. If he can tolerated I think he may do well with Cipro. 04/24/2019 on evaluation today patient appears to be doing well with regard to his wound in the gluteal region which is healing quite well. That is excellent news. With that being said he is also doing better with regard to his left lower extremity overall I feel like things are definitely showing signs of improvement. 12/28-Patient returns with regards to his gluteal wound that is improving, Left lower leg wounds with significant edema in the lower legs that appear to be persistent. Patient is in compression and has lymphedema pumps at home but apparently he does not use consistently 05/07/2019 Upon inspection patient's wound bed actually showed signs on evaluation today patient's wound currently showed signs of improvement. Steven Meza, Steven Meza ([id number]) There is still a lot of maceration but it does look like that there is been some of this maceration occurring as a result of drainage in particular and some as a result of the fact that they have been putting bag balm on the wound area as well as the leg in general. I think this is applying too much moisture and not doing him any good. I discussed with the  patient and his wife today that I think that practice needs to be adjusted I would recommend using a different type of lotion in order to help with moisture around the wound but I would not recommend anything directly on the wound itself. 05/14/2019 on evaluation today patient actually appears to be doing much better in regard to his lower extremity. They have not been using any of the bag balm on the area I think this has made a tremendous improvement overall in his wounds. He has a lot of new skin growth and though there still are a lot of open wounds scattered throughout the circumferential lower leg this overall seems to be doing much better to me. 05/21/2019 upon evaluation today patient appears to be doing well in regard to his lower extremity although he is having unfortunately some blue-green drainage noted at this point. The does have me concerned for a reinitiation of the Pseudomonas infection that we previously treated in December. The last time I gave him a prescription was April 16, 2019. That was a little over a month ago. Nonetheless he does have more green drainage noted today and I think this is indicative of potentially a revving up infection. Obviously we do not want to get to that point. 05/28/2019 on evaluation today patient appears to be doing better with regard to his lower extremity at this point. Overall I am seeing signs of improvement which is good news. Fortunately there is no evidence of active infection at this time. I do believe that the Cipro has been beneficial for him that was prescribed last week for 14 days he is halfway through that and already appears to be doing much better. This is excellent news. 06/04/2019 upon evaluation today I do believe that the patient is doing better with  regard to his lower extremity. I feel like that he has much less in the way of open wounds and drainage and much in provement in regard to the overall weeping and even his swelling. There  is no signs of erythema no obvious evidence of active infection all of which is great news. No fevers, chills, nausea, vomiting, or diarrhea. 06/11/2019 on evaluation today patient appears to be doing better with regard to his lower extremity on the left. He has been tolerating dressing changes without complication. Fortunately there is no signs of active infection at this time. No fevers, chills, nausea, vomiting, or diarrhea. 06/18/2019 upon evaluation today patient appears to be doing slightly worse compared to last week. They did call me on Friday due to the fact that he had green discharge and both his wife as well as his home health nurse were concerned about what they were seeing. Subsequently I did actually send in a prescription for Cipro for 14 days for him and according to his wife this seems to be doing better based on what she is seeing today compared to Friday. This is great news. Home health is still coming out to see him on Wednesdays and Fridays as well. 06/25/2019 upon evaluation today patient appears to be doing somewhat better in regard to his leg ulcer. Fortunately there is no signs of active infection at this time which is good news. With that being said I am very pleased with how things seem to be going and overall I see no major issues here I think the alginate is doing well along with the XtraSorb. With regard to the patient's plantar foot he does have somewhat of a blood blister that appears to be resolving at this point his wife states that she feels like the issue is that he is not dropping his shoe in order to keep it from sliding when he walks. Subsequently this is leading to increases in friction which of course is leading to more issues with getting this area to heal the patient is struggling in that regard. 07/02/2019 upon evaluation today patient appears to be making some progress in regard to his lower extremities. With that being said I really feel like the alginate may  be getting too moist and then sitting on the skin causing some complications here for [place]. I discussed with the patient and his wife today as well the possibility of going ahead and discontinuing the alginate just utilizing the Xtrasorb to try to keep the edema under better control while at the same time preventing any dressings from staying too moist right on the surface of the wounds. Obviously I think this can do a good job in helping to dry things out but I would recommend that we give this a shot. 07/09/2019 upon evaluation today patient appears to be doing better in regard to some of the edema and weeping in regard to the left lower extremity. Unfortunately he has not been wearing his compression of the right lower extremity and now that is open as well. He needs to obviously be wearing this although for today we will get a half to wrap him due to the fact that this is now open. I explained that he does need to be wearing his compression at all times. Electronic Signature(s) Signed: 07/09/2019 6:20:34 PM By: Worthy Keeler PA-C Entered By: Worthy Keeler on 07/09/2019 18:20:33 Steven Meza ([id number]) -------------------------------------------------------------------------------- Physical Exam Details Patient Name: Steven Meza Date of Service: 07/09/2019 1:45  PM Medical Record Number: [id number] Patient Account Number: [id number] Date of Birth/Sex: October 01, 1930 (86 y.o. M) Treating RN: Primary Care Provider: Fulton Reek Other Clinician: Referring Provider: Fulton Reek Treating Provider/Extender: Melburn Hake, HOYT Weeks in Treatment: 14 Constitutional Obese and well-hydrated in no acute distress. Respiratory normal breathing without difficulty. Psychiatric this patient is able to make decisions and demonstrates good insight into disease process. Alert and Oriented x 3. pleasant and cooperative. Notes Upon inspection today patient's wounds again are more skin breakdown  secondary to lymphedema and weeping and again seem to be doing quite well I do not see any signs of active infection and overall he is actually doing better with just the Xtrasorb not using the alginate I think it is much less macerated than previously noted. Electronic Signature(s) Signed: 07/09/2019 6:20:52 PM By: Worthy Keeler PA-C Entered By: Worthy Keeler on 07/09/2019 18:20:52 Steven Meza ([id number]) -------------------------------------------------------------------------------- Physician Orders Details Patient Name: Steven Meza Date of Service: 07/09/2019 1:45 PM Medical Record Number: [id number] Patient Account Number: [id number] Date of Birth/Sex: Aug 08, 1930 (86 y.o. M) Treating RN: Army Melia Primary Care Provider: Fulton Reek Other Clinician: Referring Provider: Fulton Reek Treating Provider/Extender: Melburn Hake, HOYT Weeks in Treatment: 57 Verbal / Phone Orders: No Diagnosis Coding ICD-10 Coding Meza Description I89.0 Lymphedema, not elsewhere classified I87.2 Venous insufficiency (chronic) (peripheral) L97.822 Non-pressure chronic ulcer of other part of left lower leg with fat layer exposed I10 Essential (primary) hypertension I25.10 Atherosclerotic heart disease of native coronary artery without angina pectoris L84 Corns and callosities Wound Cleansing o Dial antibacterial soap, wash wounds, rinse and pat dry prior to dressing wounds o May shower with protection. Secondary Dressing o XtraSorb - over weeping areas Dressing Change Frequency o Change Dressing Monday, Wednesday, Friday - Monday in office, Wednesday and Friday by [facility] Follow-up Appointments o Return Appointment in 1 week. Edema Control o 3 Layer Compression System - Left [place] Visits - [facility] o Home Health Nurse may visit PRN to address patientos wound care needs. o FACE TO FACE ENCOUNTER: MEDICARE and MEDICAID  PATIENTS: I certify that this patient is under my care and that I had a face-to- face encounter that meets the physician face-to-face encounter requirements with this patient on this date. The encounter with the patient was in whole or in part for the following MEDICAL CONDITION: (primary reason for [place]) MEDICAL NECESSITY: I certify, that based on my findings, NURSING services are a medically necessary home health service. HOME BOUND STATUS: I certify that my clinical findings support that this patient is homebound (i.e., Due to illness or injury, pt requires aid of supportive devices such as crutches, cane, wheelchairs, walkers, the use of special transportation or the assistance of another person to leave their place of residence. There is a normal inability to leave the home and doing so requires considerable and taxing effort. Other absences are for medical reasons / religious services and are infrequent or of short duration when for other reasons). o If current dressing causes regression in wound condition, may D/C ordered dressing product/s and apply Normal Saline Moist Dressing daily until next [place] / Other MD appointment. [place] of regression in wound condition at [phone]. o Please direct any NON-WOUND related issues/requests for orders to patient's Primary Care Physician Electronic Signature(s) Signed: 07/09/2019 4:58:25 PM By: Army Melia Signed: 07/10/2019 12:55:39 AM By: Worthy Keeler PA-C Entered By: Army Melia on 07/09/2019 14:47:24 Ringley,  Epifania Gore ([id number]) -------------------------------------------------------------------------------- Problem List Details Patient Name: QUETZAL, VERRIER Date of Service: 07/09/2019 1:45 PM Medical Record Number: [id number] Patient Account Number: [id number] Date of Birth/Sex: [birth year] (86 y.o. M) Treating RN: Primary Care Provider: Fulton Reek Other Clinician: Referring Provider:  Fulton Reek Treating Provider/Extender: Melburn Hake, HOYT Weeks in Treatment: 14 Active Problems ICD-10 Evaluated Encounter Meza Description Active Date Today Diagnosis I89.0 Lymphedema, not elsewhere classified 04/02/2019 No Yes I87.2 Venous insufficiency (chronic) (peripheral) 04/02/2019 No Yes L97.822 Non-pressure chronic ulcer of other part of left lower leg with fat layer 04/02/2019 No Yes exposed [place] (primary) hypertension 04/02/2019 No Yes I25.10 Atherosclerotic heart disease of native coronary artery without angina 04/02/2019 No Yes pectoris L84 Corns and callosities 05/09/2019 No Yes Inactive Problems Resolved Problems Electronic Signature(s) Signed: 07/09/2019 2:34:24 PM By: Worthy Keeler PA-C Entered By: Worthy Keeler on 07/09/2019 14:34:24 Steven Meza ([id number]) -------------------------------------------------------------------------------- Progress Note Details Patient Name: Steven Meza Date of Service: 07/09/2019 1:45 PM Medical Record Number: [id number] Patient Account Number: [id number] Date of Birth/Sex: 1931-04-18 (86 y.o. M) Treating RN: Primary Care Provider: Fulton Reek Other Clinician: Referring Provider: Fulton Reek Treating Provider/Extender: Melburn Hake, HOYT Weeks in Treatment: 14 Subjective Chief Complaint Information obtained from Patient Left LE Ulcer History of Present Illness (HPI) ADMISSION 05/17/2018 Mr. Pacis is an 86 year old man who is been treated at the lymphedema clinic for a long period with lymphedema wraps for bilateral lower extremity lymphedema. About 6 months ago they noted small open areas on his left lateral calf just above the ankle. These were open. They are apparently putting some form of ointment on this. They have been referred here for our review of this. The patient has a long history of lymphedema and venous stasis. It says in his records that he has recurrent cellulitis although his wife  denies this but she states that the lymphedema may have started with cellulitis several years ago. Also in his record there is a history of bladder cancer which they seem to know very little about however he did not have any radiation to the pelvis or anything that could have contributed to lymphedema that they are aware of. There is no prior wound history. The patient has 2 small punched out areas on the left lateral calf that have adherent debris on the surface. Past medical history; lymphedema, hypertension, cellulitis, hearing loss, morbid obesity, osteoarthritis, venous stasis, bladder CA, diverticulosis. ABIs in our clinic were 0.36 on the right and 0.57 on the left 05/24/2018; corrected age on this patient is 89 versus what I said last week. He has been for his arterial studies which predictably are not very good. On the right his ABI is 0.65. Monophasic waveforms noted at the right ankle including the posterior tibial and dorsalis pedis. He has triphasic waveforms of the common femoral and profunda femoris triphasic proximal SFA with monophasic distal FSA and popliteal artery. Monophasic tibial waveforms. On the left his ABI is 0.58 monophasic waveforms at the left ankle. Duplex of the left lower extremity demonstrated atherosclerotic change with monophasic waveforms throughout the left lower extremity. There was some concern about left iliac disease and potentially femoral-popliteal and tibial disease. The patient does not describe claudication although according to his wife he over emphasizes his activity. Patient states he is limited by pain in both knees His wounds are on the left lateral calf. 2 small areas with necrotic debris. We have been using silver collagen and light Ace wraps 05/31/2018; wounds on  the left lateral calf in the setting of very significant lymphedema and probably PAD. We have been using silver collagen. The base of the small wound looks reasonably improved. I sent him  to see Dr. Fletcher Anon however I see now he has an appointment with Dr. Alvester Chou on February 12 2/5; left lateral calf wound looks the same. His wife is doing a good job with her lymphedema wraps and maintaining that the edema. He most likely has PAD and is due to see Dr. Gwenlyn Found of infectious disease on February 12 2/19; left lateral calf wounds look about the same. This looks like wound secondary to chronic venous insufficiency with lymphedema however the patient has very poor arterial studies. We referred him to Dr. Gwenlyn Found. Dr. Gwenlyn Found did not feel he needed to do anything from an arterial point of view. He did not address the question about the aggressiveness of compression. After some thoughts about this I elected to go ahead and put him in 3 layer compression which after all would be less compression then the lymphedema clinic was putting on this. I am hopeful that this should be enough to get some closure of the small wounds 2/26; left lateral calf wound letter this week. He tolerated the 3 layer compression well furthermore he is sleeping in a hospital bed which helps keep his legs up at night. The wound looks better measuring smaller especially in width 3/3; left lateral calf wound about the same size. The 3 layer compression has really helped get the edema down in his leg however. Using silver collagen 3/11; left lateral calf wound better looking wound surface but about the same size. We have been using 3 layer compression which is really helped get the edema down in the left leg. Even after Dr. Kennon Holter assessment I am reluctant to go to 4 layer compression. He is tolerating 3 layer compression well. We have been using silver collagen 3/18-Patient returns for his left lateral calf wound which overall is looking better, slightly increased in size and dimensions and area, he is tolerating the 3 layer compression, has been dressed with silver collagen which we will continue 3/25 left lateral calf wound  much better looking. Size is smaller. He has been using silver collagen 4/1; gradually getting smaller in size. Using silver collagen with 3 layer compression. I think the patient has some degree of PAD. He saw Dr. Gwenlyn Found in consultation, he did not specifically comment on whether he could tolerate 4 layer compression 4/8; the wound is slightly smaller in depth. We are using 3 layer compression with silver collagen. I had him seen for arterial insufficiency however his ABI was 0.57 in the left leg. Dr. Gwenlyn Found really did not seem to middle on the degree of compression he could tolerate 4/15; the wound is slightly smaller and slightly less deep. We have been using 3 layer compression with silver collagen. There is not an option for 4-layer compression here. 4/22; the patient is making decent progress on his wound on the left lateral calf however he arrives in with a superficial wound on the right anterior tibial area. Were not really sure how this happened. He had been using a stocking on the right leg. He has been using silver collagen on the wounds Steven Meza, Steven Meza ([id number]) Amazing to me he is he appears to have compression pumps at home. I am not really sure I knew this before. In any case he has not been using them 4/29; left lateral calf wound continues  to get gradually smaller. This looks like it is on its way to closure. The area on the right anterior tibial area is about the same in terms of size superficial without any depth. This was probably wrap injury He tells me that he has been using his compression pumps once a day for 1 hour without any pain 5/6; not as good today. Left lateral calf wound is actually larger and he does not have quite as good edema control. The area on the right anterior tibia is about the same still superficial. He states he is using his compression pumps once a day. We are using 3 layer compression on the left and Curlex Coban on the right 5/13; the area on the  right is very close to being healed. The area on the left still has some depth but has a healthy wound surface. He has excellent edema control bilaterally. Using Hydrofera Blue 5/20-Patient returns in 1 week after being in 3 layer compression on the left, the left lateral calf ulcer is stable with some slough, the right anterior tibial ulcer is healed we have been using Kerlix Coban on that leg 5/27; Hydrofera Blue and 3 layer compression. Still some slough in the wound area and some nonviable edges around the wound. 6/3; this patient has lymphedema and chronic venous insufficiency. He had difficult to close wound on his left lateral calf. This is closed today. 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 Finally he has known PAD but he is managed to tolerate the compression we reporting on him and also using his compression pumps that he already had at home twice a day 6/10; we discharge this patient last week having finally close the small area in the left lateral calf. Apparently by Friday of last week this had opened and was draining. He is therefore back in clinic. His wife who is present also was concerned about an area on the right anterior tibia."o 6/17; the patient does not have an open area on the right leg. On the left he has a small circular area anteriorly and the same area laterally that he had last time. However both of these appear to be improved he has his [place] wrap 30/40. It turns out that he is only using his external compression pumps once a day. I tried to get him up to twice a day today 6/24; the patient comes in with his external compression garment on the right leg/[place] wrap. As far as we know no open wounds here. He has 2 areas that are smaller one on the left anterior pretibial and one on the left lateral calf which is the site of his initial wounds. I have successfully caught him into his external compression pumps twice a day 7/1;  still using his external compression garment on the right leg. As far as we know there are no open wounds here. The area on the left lateral calf which was the site of his initial wound has closed over. The area on the left anterior pretibial that we identified last week is still open. 7/8; as far as we know he has no open wounds on the right leg. He has his external compression garment on here. The original wound on the left lateral calf has closed over. He developed an area on the left anterior pretibial 2 weeks ago and seems to have 2 smaller areas on the proximal and distal lateral calf. We do not have his good edema  control this week as I am used to seeing 7/15; the patient has still a linear area on the left anterior tibia. The area on the left lateral leg looked somewhat inflamed today but no cellulitis. He did have what looked to be a small pustule that I cultured for CandS although I did not give him empiric antibiotics. 7/22; small area on the left anterior tibia. The area on the left lateral leg still looks inflamed. The pustule that I cultured last week grew Enterobacter and Pseudomonas. I prescribed Cipro they still have not picked that up. In any case the area actually looks fairly satisfactory. He has decent edema control using his compression pumps twice a day 7/29; the area on the left lateral leg looks a lot better than last week. Nothing is open here. He has an area just medial to the mid anterior tibia that is still open that is only open wound today. 8/5-Patient returns at 1 week for the left leg ulcer, which is actually looking better, he is also complaining of left plantar foot pain below the great toe. He does have a hematoma here 8/12-Patient returns at 1 week, the left leg open area looks about the same if not slightly better, the left plantar hematoma under the great toe is about the same, patient apparently has not been resting his foot on anything for too long period of  time and does not wear the [employer]ken[employer] shoes unless he walks outdoors which he does not do often 8/19; small wound on the left anterior lower leg this is just about closed there is nothing on the left lateral leg. He is using his compression pumps He arrives in clinic today with a black eschar over the right first plantar metatarsal head. This was apparently a bruise that was identified by her nurses last week. He wears crocs on his feet. He also may rub these on a footboard at home 8/26; there is nothing open on the left lateral lower leg he has a small area just medial to the tibia. He is going to see his podiatrist Dr. Milinda Pointer about the area on the left plantar first metatarsal head. He is using his external compression pumps. He has a juxta lite on the right leg 9/2; there is nothing open on either side of her left lower leg. He went to see podiatry about the area on the left first metatarsal head. This was apparently debrided. He was given mupirocin and oral antibiotics. I do not believe he is offloading this at all. Readmission: 04/02/2019 upon evaluation today patient actually appears for reevaluation here in our clinic secondary to issues that he is having with his left lower extremity. He has been tolerating the dressing changes without complication. Fortunately there is no signs of active infection at this time. No fevers, chills, nausea, vomiting, or diarrhea. He has been in the hospital since we last saw him and then subsequently transferred back to [facility]. He tells me that they have been changing his dressing once a day and that he has had a significant amount of pain secondary to undergoing these dressing changes. Fortunately there is no evidence of active infection at this point which is good news. No fevers, chills, nausea, vomiting, or diarrhea. With that being said he wonders if we can go back to doing what we were doing before which was when he actually had the dressing  changes performed once a week here in the clinic he tells me that he did very  well with that. At that point he tolerated a 3 layer compression wrap which was approved by vascular, Dr. Tyrell Antonio office, to be used previous although 4-layer compression wrap is probably too much for him. 04/16/2019 on evaluation today patient appears to be doing decently well with regard to his lower extremity although he has had a lot of drainage from his leg. A lot of this I think is just secondary to the lymphedema with that being said he does have some odor as well which may again just be due to more of the excessive moisture although I think that he may also have something that is a result of bacteria again he has somewhat of a Pseudomonas-like smell to the drainage and fortunately though he is not showing any signs of severe cellulitis he is still experiencing quite a bit of drainage and the discoloration is somewhat of a yellow/green in color which is consistent with Pseudomonas. If he can tolerated I think he may do well with Cipro. 04/24/2019 on evaluation today patient appears to be doing well with regard to his wound in the gluteal region which is healing quite well. That is excellent news. With that being said he is also doing better with regard to his left lower extremity overall I feel like things are definitely showing signs of improvement. Steven Meza, Steven Meza ([id number]) 12/28-Patient returns with regards to his gluteal wound that is improving, Left lower leg wounds with significant edema in the lower legs that appear to be persistent. Patient is in compression and has lymphedema pumps at home but apparently he does not use consistently 05/07/2019 Upon inspection patient's wound bed actually showed signs on evaluation today patient's wound currently showed signs of improvement. There is still a lot of maceration but it does look like that there is been some of this maceration occurring as a result of drainage  in particular and some as a result of the fact that they have been putting bag balm on the wound area as well as the leg in general. I think this is applying too much moisture and not doing him any good. I discussed with the patient and his wife today that I think that practice needs to be adjusted I would recommend using a different type of lotion in order to help with moisture around the wound but I would not recommend anything directly on the wound itself. 05/14/2019 on evaluation today patient actually appears to be doing much better in regard to his lower extremity. They have not been using any of the bag balm on the area I think this has made a tremendous improvement overall in his wounds. He has a lot of new skin growth and though there still are a lot of open wounds scattered throughout the circumferential lower leg this overall seems to be doing much better to me. 05/21/2019 upon evaluation today patient appears to be doing well in regard to his lower extremity although he is having unfortunately some blue-green drainage noted at this point. The does have me concerned for a reinitiation of the Pseudomonas infection that we previously treated in December. The last time I gave him a prescription was April 16, 2019. That was a little over a month ago. Nonetheless he does have more green drainage noted today and I think this is indicative of potentially a revving up infection. Obviously we do not want to get to that point. 05/28/2019 on evaluation today patient appears to be doing better with regard to his lower extremity  at this point. Overall I am seeing signs of improvement which is good news. Fortunately there is no evidence of active infection at this time. I do believe that the Cipro has been beneficial for him that was prescribed last week for 14 days he is halfway through that and already appears to be doing much better. This is excellent news. 06/04/2019 upon evaluation today I do believe  that the patient is doing better with regard to his lower extremity. I feel like that he has much less in the way of open wounds and drainage and much in provement in regard to the overall weeping and even his swelling. There is no signs of erythema no obvious evidence of active infection all of which is great news. No fevers, chills, nausea, vomiting, or diarrhea. 06/11/2019 on evaluation today patient appears to be doing better with regard to his lower extremity on the left. He has been tolerating dressing changes without complication. Fortunately there is no signs of active infection at this time. No fevers, chills, nausea, vomiting, or diarrhea. 06/18/2019 upon evaluation today patient appears to be doing slightly worse compared to last week. They did call me on Friday due to the fact that he had green discharge and both his wife as well as his home health nurse were concerned about what they were seeing. Subsequently I did actually send in a prescription for Cipro for 14 days for him and according to his wife this seems to be doing better based on what she is seeing today compared to Friday. This is great news. Home health is still coming out to see him on Wednesdays and Fridays as well. 06/25/2019 upon evaluation today patient appears to be doing somewhat better in regard to his leg ulcer. Fortunately there is no signs of active infection at this time which is good news. With that being said I am very pleased with how things seem to be going and overall I see no major issues here I think the alginate is doing well along with the XtraSorb. With regard to the patient's plantar foot he does have somewhat of a blood blister that appears to be resolving at this point his wife states that she feels like the issue is that he is not dropping his shoe in order to keep it from sliding when he walks. Subsequently this is leading to increases in friction which of course is leading to more issues with getting this  area to heal the patient is struggling in that regard. 07/02/2019 upon evaluation today patient appears to be making some progress in regard to his lower extremities. With that being said I really feel like the alginate may be getting too moist and then sitting on the skin causing some complications here for [place]. I discussed with the patient and his wife today as well the possibility of going ahead and discontinuing the alginate just utilizing the Xtrasorb to try to keep the edema under better control while at the same time preventing any dressings from staying too moist right on the surface of the wounds. Obviously I think this can do a good job in helping to dry things out but I would recommend that we give this a shot. 07/09/2019 upon evaluation today patient appears to be doing better in regard to some of the edema and weeping in regard to the left lower extremity. Unfortunately he has not been wearing his compression of the right lower extremity and now that is open as well. He needs to  obviously be wearing this although for today we will get a half to wrap him due to the fact that this is now open. I explained that he does need to be wearing his compression at all times. Objective Constitutional Obese and well-hydrated in no acute distress. Vitals Time Taken: 1:48 PM, Height: 65 in, Weight: 273 lbs, BMI: 45.4, Temperature: 98.4 F, Pulse: 81 bpm, Respiratory Rate: 18 breaths/min, Blood Pressure: 149/58 mmHg. Respiratory Steven Meza, Steven Meza ([id number]) normal breathing without difficulty. Psychiatric this patient is able to make decisions and demonstrates good insight into disease process. Alert and Oriented x 3. pleasant and cooperative. General Notes: Upon inspection today patient's wounds again are more skin breakdown secondary to lymphedema and weeping and again seem to be doing quite well I do not see any signs of active infection and overall he is actually doing better with just the  Xtrasorb not using the alginate I think it is much less macerated than previously noted. Assessment Active Problems ICD-10 Lymphedema, not elsewhere classified Venous insufficiency (chronic) (peripheral) Non-pressure chronic ulcer of other part of left lower leg with fat layer exposed Essential (primary) hypertension Atherosclerotic heart disease of native coronary artery without angina pectoris Corns and callosities Procedures There was a Three Layer Compression Therapy Procedure by Army Melia, RN. Post procedure Diagnosis Wound #: Same as Pre-Procedure There was a Three Layer Compression Therapy Procedure by Army Melia, RN. Post procedure Diagnosis Wound #: Same as Pre-Procedure Plan Wound Cleansing: Dial antibacterial soap, wash wounds, rinse and pat dry prior to dressing wounds May shower with protection. Secondary Dressing: XtraSorb - over weeping areas Dressing Change Frequency: Change Dressing Monday, Wednesday, Friday - Monday in office, Wednesday and Friday by [facility] Follow-up Appointments: Return Appointment in 1 week. Edema Control: 3 Layer Compression System - Left Lower Extremity Home Health: [place] Nurse may visit PRN to address patient s wound care needs. FACE TO FACE ENCOUNTER: MEDICARE and MEDICAID PATIENTS: I certify that this patient is under my care and that I had a face-to-face encounter that meets the physician face-to-face encounter requirements with this patient on this date. The encounter with the patient was in whole or in part for the following MEDICAL CONDITION: (primary reason for [place]) MEDICAL NECESSITY: I certify, that based on my findings, NURSING services are a medically necessary home health service. HOME BOUND STATUS: I certify that my clinical findings support that this patient is homebound (i.e., Due to illness or injury, pt requires aid of supportive devices such as crutches, cane,  wheelchairs, walkers, the use of special transportation or the assistance of another person to leave their place of residence. There is a normal inability to leave the home and doing so requires considerable and taxing effort. Other absences are for medical reasons / religious services and are infrequent or of short duration when for other reasons). If current dressing causes regression in wound condition, may D/C ordered dressing product/s and apply Normal Saline Moist Dressing daily until next Steven Meza, Steven Meza ([id number]) [place] / Other MD appointment. Cruger of regression in wound condition at [phone]. Please direct any NON-WOUND related issues/requests for orders to patient's Primary Care Physician 1. My suggestion at this time is good to be that we go ahead and continue with the Lauraine Rinne we will also wrap the right lower extremity not just the left. 2. We will get a continue both sides with a 3 layer compression wraps they do seem  to beneficial. 3. I am also can recommend the patient continue to elevate his legs he also needs to get up and walk once an hour for 5 to 10 minutes in order to get blood flowing through his legs to prevent edema buildup. We will see patient back for reevaluation in 1 week here in the clinic. If anything worsens or changes patient will contact our office for additional recommendations. Electronic Signature(s) Signed: 07/09/2019 6:21:30 PM By: Worthy Keeler PA-C Entered By: Worthy Keeler on 07/09/2019 18:21:30 Steven Meza ([id number]) -------------------------------------------------------------------------------- SuperBill Details Patient Name: Steven Meza Date of Service: 07/09/2019 Medical Record Number: [id number] Patient Account Number: [id number] Date of Birth/Sex: 07-05-1930 (86 y.o. M) Treating RN: Army Melia Primary Care Provider: Fulton Reek Other Clinician: Referring Provider: Fulton Reek Treating Provider/Extender: Melburn Hake, HOYT Weeks in Treatment: 14 Diagnosis Coding ICD-10 Codes Meza Description I89.0 Lymphedema, not elsewhere classified I87.2 Venous insufficiency (chronic) (peripheral) L97.822 Non-pressure chronic ulcer of other part of left lower leg with fat layer exposed I10 Essential (primary) hypertension I25.10 Atherosclerotic heart disease of native coronary artery without angina pectoris L84 Corns and callosities Facility Procedures CPT4: Description Modifier Quantity Meza [id number] BILATERAL: Application of multi-layer venous compression system; leg (below knee), including ankle 1 and foot. Physician Procedures CPT4 Meza: [id number] Description: [id number] - WC PHYS LEVEL 3 - EST PT Modifier: Quantity: 1 CPT4 Meza: Description: ICD-10 Diagnosis Description I89.0 Lymphedema, not elsewhere classified I87.2 Venous insufficiency (chronic) (peripheral) L97.822 Non-pressure chronic ulcer of other part of left lower leg with fat layer e I10 Essential (primary)  hypertension Modifier: xposed Quantity: Electronic Signature(s) Signed: 07/09/2019 6:21:49 PM By: Worthy Keeler PA-C Previous Signature: 07/09/2019 4:58:25 PM Version By: Army Melia Entered By: Worthy Keeler on 07/09/2019 18:21:49

## 2019-07-16 ENCOUNTER — Other Ambulatory Visit: Payer: Self-pay

## 2019-07-16 ENCOUNTER — Encounter: Payer: Medicare Other | Admitting: Physician Assistant

## 2019-07-16 DIAGNOSIS — L97822 Non-pressure chronic ulcer of other part of left lower leg with fat layer exposed: Secondary | ICD-10-CM | POA: Diagnosis not present

## 2019-07-16 NOTE — Progress Notes (Signed)
CAMDIN, GOODRUM ([id number]) Visit Report for 07/16/2019 Chief Complaint Document Details Patient Name: Steven Meza Date of Service: 07/16/2019 1:45 PM Medical Record Number: [id number] Patient Account Number: [id number] Date of Birth/Sex: 1931-04-24 (86 y.o. M) Treating RN: Primary Care Provider: Fulton Reek Other Clinician: Referring Provider: Fulton Reek Treating Provider/Extender: Melburn Hake, Harshita Bernales Weeks in Treatment: 15 Information Obtained from: Patient Chief Complaint Left LE Ulcer Electronic Signature(s) Signed: 07/16/2019 2:06:51 PM By: Worthy Keeler PA-C Entered By: Worthy Keeler on 07/16/2019 14:06:50 Steven Meza ([id number]) -------------------------------------------------------------------------------- HPI Details Patient Name: Steven Meza Date of Service: 07/16/2019 1:45 PM Medical Record Number: [id number] Patient Account Number: [id number] Date of Birth/Sex: 1930-09-05 (86 y.o. M) Treating RN: Primary Care Provider: Fulton Reek Other Clinician: Referring Provider: Fulton Reek Treating Provider/Extender: Melburn Hake, Raney Antwine Weeks in Treatment: 15 History of Present Illness HPI Description: ADMISSION 05/17/2018 Steven Meza is an 86 year old man who is been treated at the lymphedema clinic for a long period with lymphedema wraps for bilateral lower extremity lymphedema. About 6 months ago they noted small open areas on his left lateral calf just above the ankle. These were open. They are apparently putting some form of ointment on this. They have been referred here for our review of this. The patient has a long history of lymphedema and venous stasis. It says in his records that he has recurrent cellulitis although his wife denies this but she states that the lymphedema may have started with cellulitis several years ago. Also in his record there is a history of bladder cancer which they seem to know very little about however he did not have any  radiation to the pelvis or anything that could have contributed to lymphedema that they are aware of. There is no prior wound history. The patient has 2 small punched out areas on the left lateral calf that have adherent debris on the surface. Past medical history; lymphedema, hypertension, cellulitis, hearing loss, morbid obesity, osteoarthritis, venous stasis, bladder CA, diverticulosis. ABIs in our clinic were 0.36 on the right and 0.57 on the left 05/24/2018; corrected age on this patient is 31 versus what I said last week. He has been for his arterial studies which predictably are not very good. On the right his ABI is 0.65. Monophasic waveforms noted at the right ankle including the posterior tibial and dorsalis pedis. He has triphasic waveforms of the common femoral and profunda femoris triphasic proximal SFA with monophasic distal FSA and popliteal artery. Monophasic tibial waveforms. On the left his ABI is 0.58 monophasic waveforms at the left ankle. Duplex of the left lower extremity demonstrated atherosclerotic change with monophasic waveforms throughout the left lower extremity. There was some concern about left iliac disease and potentially femoral-popliteal and tibial disease. The patient does not describe claudication although according to his wife he over emphasizes his activity. Patient states he is limited by pain in both knees His wounds are on the left lateral calf. 2 small areas with necrotic debris. We have been using silver collagen and light Ace wraps 05/31/2018; wounds on the left lateral calf in the setting of very significant lymphedema and probably PAD. We have been using silver collagen. The base of the small wound looks reasonably improved. I sent him to see Dr. Fletcher Anon however I see now he has an appointment with Dr. Alvester Chou on February 12 2/5; left lateral calf wound looks the same. His wife is doing a good job with her lymphedema wraps and maintaining that the edema.  He  most likely has PAD and is due to see Dr. Gwenlyn Found of infectious disease on February 12 2/19; left lateral calf wounds look about the same. This looks like wound secondary to chronic venous insufficiency with lymphedema however the patient has very poor arterial studies. We referred him to Dr. Gwenlyn Found. Dr. Gwenlyn Found did not feel he needed to do anything from an arterial point of view. He did not address the question about the aggressiveness of compression. After some thoughts about this I elected to go ahead and put him in 3 layer compression which after all would be less compression then the lymphedema clinic was putting on this. I am hopeful that this should be enough to get some closure of the small wounds 2/26; left lateral calf wound letter this week. He tolerated the 3 layer compression well furthermore he is sleeping in a hospital bed which helps keep his legs up at night. The wound looks better measuring smaller especially in width 3/3; left lateral calf wound about the same size. The 3 layer compression has really helped get the edema down in his leg however. Using silver collagen 3/11; left lateral calf wound better looking wound surface but about the same size. We have been using 3 layer compression which is really helped get the edema down in the left leg. Even after Dr. Kennon Holter assessment I am reluctant to go to 4 layer compression. He is tolerating 3 layer compression well. We have been using silver collagen 3/18-Patient returns for his left lateral calf wound which overall is looking better, slightly increased in size and dimensions and area, he is tolerating the 3 layer compression, has been dressed with silver collagen which we will continue 3/25oleft lateral calf wound much better looking. Size is smaller. He has been using silver collagen 4/1; gradually getting smaller in size. Using silver collagen with 3 layer compression. I think the patient has some degree of PAD. He saw Dr. Gwenlyn Found  in consultation, he did not specifically comment on whether he could tolerate 4 layer compression 4/8; the wound is slightly smaller in depth. We are using 3 layer compression with silver collagen. I had him seen for arterial insufficiency however his ABI was 0.57 in the left leg. Dr. Gwenlyn Found really did not seem to middle on the degree of compression he could tolerate 4/15; the wound is slightly smaller and slightly less deep. We have been using 3 layer compression with silver collagen. There is not an option for 4-layer compression here. 4/22; the patient is making decent progress on his wound on the left lateral calf however he arrives in with a superficial wound on the right anterior tibial area. Were not really sure how this happened. He had been using a stocking on the right leg. He has been using silver collagen on the wounds Amazing to me he is he appears to have compression pumps at home. I am not really sure I knew this before. In any case he has not been using them 4/29; left lateral calf wound continues to get gradually smaller. This looks like it is on its way to closure. oThe area on the right anterior tibial area is about the same in terms of size superficial without any depth. This was probably wrap injury oHe tells me that he has been using his compression pumps once a day for 1 hour without any pain Steven Meza, Steven Meza ([id number]) 5/6; not as good today. Left lateral calf wound is actually larger and he does not  have quite as good edema control. The area on the right anterior tibia is about the same still superficial. He states he is using his compression pumps once a day. We are using 3 layer compression on the left and Curlex Coban on the right 5/13; the area on the right is very close to being healed. The area on the left still has some depth but has a healthy wound surface. He has excellent edema control bilaterally. Using Hydrofera Blue 5/20-Patient returns in 1 week after being  in 3 layer compression on the left, the left lateral calf ulcer is stable with some slough, the right anterior tibial ulcer is healed we have been using Kerlix Coban on that leg 5/27; Hydrofera Blue and 3 layer compression. Still some slough in the wound area and some nonviable edges around the wound. 6/3; this patient has lymphedema and chronic venous insufficiency. He had difficult to close wound on his left lateral calf. This is closed today. 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 Finally he has known PAD but he is managed to tolerate the compression we reporting on him and also using his compression pumps that he already had at home twice a day 6/10; we discharge this patient last week having finally close the small area in the left lateral calf. Apparently by Friday of last week this had opened and was draining. He is therefore back in clinic. His wife who is present also was concerned about an area on the right anterior tibia."o 6/17; the patient does not have an open area on the right leg. On the left he has a small circular area anteriorly and the same area laterally that he had last time. However both of these appear to be improved he has his Farrow wrap 30/40. It turns out that he is only using his external compression pumps once a day. I tried to get him up to twice a day today 6/24; the patient comes in with his external compression garment on the right leg/Farrow wrap. As far as we know no open wounds here. He has 2 areas that are smaller one on the left anterior pretibial and one on the left lateral calf which is the site of his initial wounds. I have successfully caught him into his external compression pumps twice a day 7/1; still using his external compression garment on the right leg. As far as we know there are no open wounds here. The area on the left lateral calf which was the site of his initial wound has closed over. The area on the left  anterior pretibial that we identified last week is still open. 7/8; as far as we know he has no open wounds on the right leg. He has his external compression garment on here. The original wound on the left lateral calf has closed over. He developed an area on the left anterior pretibial 2 weeks ago and seems to have 2 smaller areas on the proximal and distal lateral calf. We do not have his good edema control this week as I am used to seeing 7/15; the patient has still a linear area on the left anterior tibia. The area on the left lateral leg looked somewhat inflamed today but no cellulitis. He did have what looked to be a small pustule that I cultured for CandS although I did not give him empiric antibiotics. 7/22; small area on the left anterior tibia. The area on the left lateral leg still  looks inflamed. The pustule that I cultured last week grew Enterobacter and Pseudomonas. I prescribed Cipro they still have not picked that up. In any case the area actually looks fairly satisfactory. He has decent edema control using his compression pumps twice a day 7/29; the area on the left lateral leg looks a lot better than last week. Nothing is open here. He has an area just medial to the mid anterior tibia that is still open that is only open wound today. 8/5-Patient returns at 1 week for the left leg ulcer, which is actually looking better, he is also complaining of left plantar foot pain below the great toe. He does have a hematoma here 8/12-Patient returns at 1 week, the left leg open area looks about the same if not slightly better, the left plantar hematoma under the great toe is about the same, patient apparently has not been resting his foot on anything for too long period of time and does not wear the Birkenstock shoes unless he walks outdoors which he does not do often 8/19; small wound on the left anterior lower leg this is just about closed there is nothing on the left lateral leg. He is using  his compression pumps He arrives in clinic today with a black eschar over the right first plantar metatarsal head. This was apparently a bruise that was identified by her nurses last week. He wears crocs on his feet. He also may rub these on a footboard at home 8/26; there is nothing open on the left lateral lower leg he has a small area just medial to the tibia. He is going to see his podiatrist Dr. Milinda Pointer about the area on the left plantar first metatarsal head. He is using his external compression pumps. He has a juxta lite on the right leg 9/2; there is nothing open on either side of her left lower leg. He went to see podiatry about the area on the left first metatarsal head. This was apparently debrided. He was given mupirocin and oral antibiotics. I do not believe he is offloading this at all. Readmission: 04/02/2019 upon evaluation today patient actually appears for reevaluation here in our clinic secondary to issues that he is having with his left lower extremity. He has been tolerating the dressing changes without complication. Fortunately there is no signs of active infection at this time. No fevers, chills, nausea, vomiting, or diarrhea. He has been in the hospital since we last saw him and then subsequently transferred back to [facility]. He tells me that they have been changing his dressing once a day and that he has had a significant amount of pain secondary to undergoing these dressing changes. Fortunately there is no evidence of active infection at this point which is good news. No fevers, chills, nausea, vomiting, or diarrhea. With that being said he wonders if we can go back to doing what we were doing before which was when he actually had the dressing changes performed once a week here in the clinic he tells me that he did very well with that. At that point he tolerated a 3 layer compression wrap which was approved by vascular, Dr. Tyrell Antonio office, to be used previous although  4-layer compression wrap is probably too much for him. 04/16/2019 on evaluation today patient appears to be doing decently well with regard to his lower extremity although he has had a lot of drainage from his leg. A lot of this I think is just secondary to the lymphedema  with that being said he does have some odor as well which may again just be due to more of the excessive moisture although I think that he may also have something that is a result of bacteria again he has somewhat of a Pseudomonas-like smell to the drainage and fortunately though he is not showing any signs of severe cellulitis he is still experiencing quite a bit of drainage and the discoloration is somewhat of a yellow/green in color which is consistent with Pseudomonas. If he can tolerated I think he may do well with Cipro. 04/24/2019 on evaluation today patient appears to be doing well with regard to his wound in the gluteal region which is healing quite well. That is excellent news. With that being said he is also doing better with regard to his left lower extremity overall I feel like things are definitely showing signs of improvement. 12/28-Patient returns with regards to his gluteal wound that is improving, Left lower leg wounds with significant edema in the lower legs that appear to be persistent. Patient is in compression and has lymphedema pumps at home but apparently he does not use consistently 05/07/2019 Upon inspection patient's wound bed actually showed signs on evaluation today patient's wound currently showed signs of improvement. Steven Meza, Steven Meza ([id number]) There is still a lot of maceration but it does look like that there is been some of this maceration occurring as a result of drainage in particular and some as a result of the fact that they have been putting bag balm on the wound area as well as the leg in general. I think this is applying too much moisture and not doing him any good. I discussed with the  patient and his wife today that I think that practice needs to be adjusted I would recommend using a different type of lotion in order to help with moisture around the wound but I would not recommend anything directly on the wound itself. 05/14/2019 on evaluation today patient actually appears to be doing much better in regard to his lower extremity. They have not been using any of the bag balm on the area I think this has made a tremendous improvement overall in his wounds. He has a lot of new skin growth and though there still are a lot of open wounds scattered throughout the circumferential lower leg this overall seems to be doing much better to me. 05/21/2019 upon evaluation today patient appears to be doing well in regard to his lower extremity although he is having unfortunately some blue-green drainage noted at this point. The does have me concerned for a reinitiation of the Pseudomonas infection that we previously treated in December. The last time I gave him a prescription was April 16, 2019. That was a little over a month ago. Nonetheless he does have more green drainage noted today and I think this is indicative of potentially a revving up infection. Obviously we do not want to get to that point. 05/28/2019 on evaluation today patient appears to be doing better with regard to his lower extremity at this point. Overall I am seeing signs of improvement which is good news. Fortunately there is no evidence of active infection at this time. I do believe that the Cipro has been beneficial for him that was prescribed last week for 14 days he is halfway through that and already appears to be doing much better. This is excellent news. 06/04/2019 upon evaluation today I do believe that the patient is doing better with  regard to his lower extremity. I feel like that he has much less in the way of open wounds and drainage and much in provement in regard to the overall weeping and even his swelling. There  is no signs of erythema no obvious evidence of active infection all of which is great news. No fevers, chills, nausea, vomiting, or diarrhea. 06/11/2019 on evaluation today patient appears to be doing better with regard to his lower extremity on the left. He has been tolerating dressing changes without complication. Fortunately there is no signs of active infection at this time. No fevers, chills, nausea, vomiting, or diarrhea. 06/18/2019 upon evaluation today patient appears to be doing slightly worse compared to last week. They did call me on Friday due to the fact that he had green discharge and both his wife as well as his home health nurse were concerned about what they were seeing. Subsequently I did actually send in a prescription for Cipro for 14 days for him and according to his wife this seems to be doing better based on what she is seeing today compared to Friday. This is great news. Home health is still coming out to see him on Wednesdays and Fridays as well. 06/25/2019 upon evaluation today patient appears to be doing somewhat better in regard to his leg ulcer. Fortunately there is no signs of active infection at this time which is good news. With that being said I am very pleased with how things seem to be going and overall I see no major issues here I think the alginate is doing well along with the XtraSorb. With regard to the patient's plantar foot he does have somewhat of a blood blister that appears to be resolving at this point his wife states that she feels like the issue is that he is not dropping his shoe in order to keep it from sliding when he walks. Subsequently this is leading to increases in friction which of course is leading to more issues with getting this area to heal the patient is struggling in that regard. 07/02/2019 upon evaluation today patient appears to be making some progress in regard to his lower extremities. With that being said I really feel like the alginate may  be getting too moist and then sitting on the skin causing some complications here for [place]. I discussed with the patient and his wife today as well the possibility of going ahead and discontinuing the alginate just utilizing the Xtrasorb to try to keep the edema under better control while at the same time preventing any dressings from staying too moist right on the surface of the wounds. Obviously I think this can do a good job in helping to dry things out but I would recommend that we give this a shot. 07/09/2019 upon evaluation today patient appears to be doing better in regard to some of the edema and weeping in regard to the left lower extremity. Unfortunately he has not been wearing his compression of the right lower extremity and now that is open as well. He needs to obviously be wearing this although for today we will get a half to wrap him due to the fact that this is now open. I explained that he does need to be wearing his compression at all times. 07/16/2019 upon evaluation today patient actually does appear to be making some progress in regard to his lower extremity ulcers at this point. In fact he really does not have any ulcerations as much as he just  does have lymphedema breakdown causing weeping. Nonetheless he is drying up quite a bit but overall seems to be doing much better. He has a lot of dry flaky skin I think we may want to add triamcinolone to the skin to try to help out with cutting back on some of the irritation here hopefully improve the overall skin quality. He does have Velcro compression wraps once he heals that is good news as well. Electronic Signature(s) Signed: 07/16/2019 2:24:05 PM By: Worthy Keeler PA-C Entered By: Worthy Keeler on 07/16/2019 14:24:05 Steven Meza ([id number]) -------------------------------------------------------------------------------- Physical Exam Details Patient Name: Steven Meza Date of Service: 07/16/2019 1:45 PM Medical Record  Number: [id number] Patient Account Number: [id number] Date of Birth/Sex: Sep 12, 1930 (86 y.o. M) Treating RN: Primary Care Provider: Fulton Reek Other Clinician: Referring Provider: Fulton Reek Treating Provider/Extender: Melburn Hake, Johnte Portnoy Weeks in Treatment: 15 Constitutional Obese and well-hydrated in no acute distress. Respiratory normal breathing without difficulty. Psychiatric this patient is able to make decisions and demonstrates good insight into disease process. Alert and Oriented x 3. pleasant and cooperative. Notes Upon inspection patient again has no definitive wounds just areas that are open and weeping although there are much fewer on the left compared to previous. Overall he seems to be doing quite well and very pleased in this regard. With that being said I do think that he needs to continue to try to elevate his legs much as possible also think the lymphedema will be well controlled with the elevation and wraps although he needs to definitely be wearing his compression wraps once he heals were not there yet. Electronic Signature(s) Signed: 07/16/2019 2:24:42 PM By: Worthy Keeler PA-C Entered By: Worthy Keeler on 07/16/2019 14:24:41 Steven Meza ([id number]) -------------------------------------------------------------------------------- Physician Orders Details Patient Name: Steven Meza Date of Service: 07/16/2019 1:45 PM Medical Record Number: [id number] Patient Account Number: [id number] Date of Birth/Sex: 1931/03/12 (86 y.o. M) Treating RN: Army Melia Primary Care Provider: Fulton Reek Other Clinician: Referring Provider: Fulton Reek Treating Provider/Extender: Melburn Hake, Chyrl Elwell Weeks in Treatment: 15 Verbal / Phone Orders: No Diagnosis Coding ICD-10 Coding Meza Description I89.0 Lymphedema, not elsewhere classified I87.2 Venous insufficiency (chronic) (peripheral) L97.822 Non-pressure chronic ulcer of other part of left lower leg with fat  layer exposed I10 Essential (primary) hypertension I25.10 Atherosclerotic heart disease of native coronary artery without angina pectoris L84 Corns and callosities Wound Cleansing o Dial antibacterial soap, wash wounds, rinse and pat dry prior to dressing wounds o May shower with protection. Skin Barriers/Peri-Wound Care o Triamcinolone Acetonide Ointment (TCA) Secondary Dressing o XtraSorb - over weeping areas Dressing Change Frequency o Change Dressing Monday, Wednesday, Friday - Monday in office, Wednesday and Friday by [facility] Follow-up Appointments o Return Appointment in 1 week. Edema Control o 3 Layer Compression System - [facility] - [facility] o Home Health Nurse may visit PRN to address patientos wound care needs. o FACE TO FACE ENCOUNTER: MEDICARE and MEDICAID PATIENTS: I certify that this patient is under my care and that I had a face-to- face encounter that meets the physician face-to-face encounter requirements with this patient on this date. The encounter with the patient was in whole or in part for the following MEDICAL CONDITION: (primary reason for [place]) MEDICAL NECESSITY: I certify, that based on my findings, NURSING services are a medically necessary home health service. HOME BOUND STATUS: I certify that my clinical findings support that this patient  is homebound (i.e., Due to illness or injury, pt requires aid of supportive devices such as crutches, cane, wheelchairs, walkers, the use of special transportation or the assistance of another person to leave their place of residence. There is a normal inability to leave the home and doing so requires considerable and taxing effort. Other absences are for medical reasons / religious services and are infrequent or of short duration when for other reasons). o If current dressing causes regression in wound condition, may D/C ordered dressing  product/s and apply Normal Saline Moist Dressing daily until next [place] / Other MD appointment. [place] of regression in wound condition at [phone]. o Please direct any NON-WOUND related issues/requests for orders to patient's Primary Care Physician Patient Medications Allergies: No Known Allergies Notifications Medication Indication Start End triamcinolone acetonide 07/16/2019 DOSE topical 0.1 % ointment - ointment topical applied in a thin film over both legs with each dressing change as directed in the clinic. Steven Meza, Steven Meza ([id number]) Electronic Signature(s) Signed: 07/16/2019 2:26:22 PM By: Worthy Keeler PA-C Entered By: Worthy Keeler on 07/16/2019 14:26:21 Steven Meza ([id number]) -------------------------------------------------------------------------------- Problem List Details Patient Name: Steven Meza Date of Service: 07/16/2019 1:45 PM Medical Record Number: [id number] Patient Account Number: [id number] Date of Birth/Sex: 1931-03-16 (86 y.o. M) Treating RN: Primary Care Provider: Fulton Reek Other Clinician: Referring Provider: Fulton Reek Treating Provider/Extender: Melburn Hake, Sirenia Whitis Weeks in Treatment: 15 Active Problems ICD-10 Evaluated Encounter Meza Description Active Date Today Diagnosis I89.0 Lymphedema, not elsewhere classified 04/02/2019 No Yes I87.2 Venous insufficiency (chronic) (peripheral) 04/02/2019 No Yes L97.822 Non-pressure chronic ulcer of other part of left lower leg with fat layer 04/02/2019 No Yes exposed [place] (primary) hypertension 04/02/2019 No Yes I25.10 Atherosclerotic heart disease of native coronary artery without angina 04/02/2019 No Yes pectoris L84 Corns and callosities 05/09/2019 No Yes Inactive Problems Resolved Problems Electronic Signature(s) Signed: 07/16/2019 2:06:43 PM By: Worthy Keeler PA-C Entered By: Worthy Keeler on 07/16/2019 14:06:42 Steven Meza ([id number]) -------------------------------------------------------------------------------- Progress Note Details Patient Name: Steven Meza Date of Service: 07/16/2019 1:45 PM Medical Record Number: [id number] Patient Account Number: [id number] Date of Birth/Sex: 1930/07/29 (86 y.o. M) Treating RN: Primary Care Provider: Fulton Reek Other Clinician: Referring Provider: Fulton Reek Treating Provider/Extender: Melburn Hake, Tempest Frankland Weeks in Treatment: 15 Subjective Chief Complaint Information obtained from Patient Left LE Ulcer History of Present Illness (HPI) ADMISSION 05/17/2018 Mr. Kok is an 86 year old man who is been treated at the lymphedema clinic for a long period with lymphedema wraps for bilateral lower extremity lymphedema. About 6 months ago they noted small open areas on his left lateral calf just above the ankle. These were open. They are apparently putting some form of ointment on this. They have been referred here for our review of this. The patient has a long history of lymphedema and venous stasis. It says in his records that he has recurrent cellulitis although his wife denies this but she states that the lymphedema may have started with cellulitis several years ago. Also in his record there is a history of bladder cancer which they seem to know very little about however he did not have any radiation to the pelvis or anything that could have contributed to lymphedema that they are aware of. There is no prior wound history. The patient has 2 small punched out areas on the left lateral calf that have adherent debris on the surface. Past medical history; lymphedema, hypertension, cellulitis, hearing  loss, morbid obesity, osteoarthritis, venous stasis, bladder CA, diverticulosis. ABIs in our clinic were 0.36 on the right and 0.57 on the left 05/24/2018; corrected age on this patient is 83 versus what I said last week. He has been for his arterial studies which  predictably are not very good. On the right his ABI is 0.65. Monophasic waveforms noted at the right ankle including the posterior tibial and dorsalis pedis. He has triphasic waveforms of the common femoral and profunda femoris triphasic proximal SFA with monophasic distal FSA and popliteal artery. Monophasic tibial waveforms. On the left his ABI is 0.58 monophasic waveforms at the left ankle. Duplex of the left lower extremity demonstrated atherosclerotic change with monophasic waveforms throughout the left lower extremity. There was some concern about left iliac disease and potentially femoral-popliteal and tibial disease. The patient does not describe claudication although according to his wife he over emphasizes his activity. Patient states he is limited by pain in both knees His wounds are on the left lateral calf. 2 small areas with necrotic debris. We have been using silver collagen and light Ace wraps 05/31/2018; wounds on the left lateral calf in the setting of very significant lymphedema and probably PAD. We have been using silver collagen. The base of the small wound looks reasonably improved. I sent him to see Dr. Fletcher Anon however I see now he has an appointment with Dr. Alvester Chou on February 12 2/5; left lateral calf wound looks the same. His wife is doing a good job with her lymphedema wraps and maintaining that the edema. He most likely has PAD and is due to see Dr. Gwenlyn Found of infectious disease on February 12 2/19; left lateral calf wounds look about the same. This looks like wound secondary to chronic venous insufficiency with lymphedema however the patient has very poor arterial studies. We referred him to Dr. Gwenlyn Found. Dr. Gwenlyn Found did not feel he needed to do anything from an arterial point of view. He did not address the question about the aggressiveness of compression. After some thoughts about this I elected to go ahead and put him in 3 layer compression which after all would be less  compression then the lymphedema clinic was putting on this. I am hopeful that this should be enough to get some closure of the small wounds 2/26; left lateral calf wound letter this week. He tolerated the 3 layer compression well furthermore he is sleeping in a hospital bed which helps keep his legs up at night. The wound looks better measuring smaller especially in width 3/3; left lateral calf wound about the same size. The 3 layer compression has really helped get the edema down in his leg however. Using silver collagen 3/11; left lateral calf wound better looking wound surface but about the same size. We have been using 3 layer compression which is really helped get the edema down in the left leg. Even after Dr. Kennon Holter assessment I am reluctant to go to 4 layer compression. He is tolerating 3 layer compression well. We have been using silver collagen 3/18-Patient returns for his left lateral calf wound which overall is looking better, slightly increased in size and dimensions and area, he is tolerating the 3 layer compression, has been dressed with silver collagen which we will continue 3/25 left lateral calf wound much better looking. Size is smaller. He has been using silver collagen 4/1; gradually getting smaller in size. Using silver collagen with 3 layer compression. I think the patient has some degree of PAD. He  saw Dr. Gwenlyn Found in consultation, he did not specifically comment on whether he could tolerate 4 layer compression 4/8; the wound is slightly smaller in depth. We are using 3 layer compression with silver collagen. I had him seen for arterial insufficiency however his ABI was 0.57 in the left leg. Dr. Gwenlyn Found really did not seem to middle on the degree of compression he could tolerate 4/15; the wound is slightly smaller and slightly less deep. We have been using 3 layer compression with silver collagen. There is not an option for 4-layer compression here. 4/22; the patient is making  decent progress on his wound on the left lateral calf however he arrives in with a superficial wound on the right anterior tibial area. Were not really sure how this happened. He had been using a stocking on the right leg. He has been using silver collagen on the wounds Steven Meza, Steven Meza ([id number]) Amazing to me he is he appears to have compression pumps at home. I am not really sure I knew this before. In any case he has not been using them 4/29; left lateral calf wound continues to get gradually smaller. This looks like it is on its way to closure. The area on the right anterior tibial area is about the same in terms of size superficial without any depth. This was probably wrap injury He tells me that he has been using his compression pumps once a day for 1 hour without any pain 5/6; not as good today. Left lateral calf wound is actually larger and he does not have quite as good edema control. The area on the right anterior tibia is about the same still superficial. He states he is using his compression pumps once a day. We are using 3 layer compression on the left and Curlex Coban on the right 5/13; the area on the right is very close to being healed. The area on the left still has some depth but has a healthy wound surface. He has excellent edema control bilaterally. Using Hydrofera Blue 5/20-Patient returns in 1 week after being in 3 layer compression on the left, the left lateral calf ulcer is stable with some slough, the right anterior tibial ulcer is healed we have been using Kerlix Coban on that leg 5/27; Hydrofera Blue and 3 layer compression. Still some slough in the wound area and some nonviable edges around the wound. 6/3; this patient has lymphedema and chronic venous insufficiency. He had difficult to close wound on his left lateral calf. This is closed today. 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 Finally he has known PAD but he is managed to  tolerate the compression we reporting on him and also using his compression pumps that he already had at home twice a day 6/10; we discharge this patient last week having finally close the small area in the left lateral calf. Apparently by Friday of last week this had opened and was draining. He is therefore back in clinic. His wife who is present also was concerned about an area on the right anterior tibia."o 6/17; the patient does not have an open area on the right leg. On the left he has a small circular area anteriorly and the same area laterally that he had last time. However both of these appear to be improved he has his [place] wrap 30/40. It turns out that he is only using his external compression pumps once a day. I tried to get him up  to twice a day today 6/24; the patient comes in with his external compression garment on the right leg/F[place] wrap. As far as we know no open wounds here. He has 2 areas that are smaller one on the left anterior pretibial and one on the left lateral calf which is the site of his initial wounds. I have successfully caught him into his external compression pumps twice a day 7/1; still using his external compression garment on the right leg. As far as we know there are no open wounds here. The area on the left lateral calf which was the site of his initial wound has closed over. The area on the left anterior pretibial that we identified last week is still open. 7/8; as far as we know he has no open wounds on the right leg. He has his external compression garment on here. The original wound on the left lateral calf has closed over. He developed an area on the left anterior pretibial 2 weeks ago and seems to have 2 smaller areas on the proximal and distal lateral calf. We do not have his good edema control this week as I am used to seeing 7/15; the patient has still a linear area on the left anterior tibia. The area on the left lateral leg looked somewhat inflamed  today but no cellulitis. He did have what looked to be a small pustule that I cultured for CandS although I did not give him empiric antibiotics. 7/22; small area on the left anterior tibia. The area on the left lateral leg still looks inflamed. The pustule that I cultured last week grew Enterobacter and Pseudomonas. I prescribed Cipro they still have not picked that up. In any case the area actually looks fairly satisfactory. He has decent edema control using his compression pumps twice a day 7/29; the area on the left lateral leg looks a lot better than last week. Nothing is open here. He has an area just medial to the mid anterior tibia that is still open that is only open wound today. 8/5-Patient returns at 1 week for the left leg ulcer, which is actually looking better, he is also complaining of left plantar foot pain below the great toe. He does have a hematoma here 8/12-Patient returns at 1 week, the left leg open area looks about the same if not slightly better, the left plantar hematoma under the great toe is about the same, patient apparently has not been resting his foot on anything for too long period of time and does not wear the Birkenstock shoes unless he walks outdoors which he does not do often 8/19; small wound on the left anterior lower leg this is just about closed there is nothing on the left lateral leg. He is using his compression pumps He arrives in clinic today with a black eschar over the right first plantar metatarsal head. This was apparently a bruise that was identified by her nurses last week. He wears crocs on his feet. He also may rub these on a footboard at home 8/26; there is nothing open on the left lateral lower leg he has a small area just medial to the tibia. He is going to see his podiatrist Dr. Milinda Pointer about the area on the left plantar first metatarsal head. He is using his external compression pumps. He has a juxta lite on the right leg 9/2; there is nothing  open on either side of her left lower leg. He went to see podiatry about the area  on the left first metatarsal head. This was apparently debrided. He was given mupirocin and oral antibiotics. I do not believe he is offloading this at all. Readmission: 04/02/2019 upon evaluation today patient actually appears for reevaluation here in our clinic secondary to issues that he is having with his left lower extremity. He has been tolerating the dressing changes without complication. Fortunately there is no signs of active infection at this time. No fevers, chills, nausea, vomiting, or diarrhea. He has been in the hospital since we last saw him and then subsequently transferred back to [facility]. He tells me that they have been changing his dressing once a day and that he has had a significant amount of pain secondary to undergoing these dressing changes. Fortunately there is no evidence of active infection at this point which is good news. No fevers, chills, nausea, vomiting, or diarrhea. With that being said he wonders if we can go back to doing what we were doing before which was when he actually had the dressing changes performed once a week here in the clinic he tells me that he did very well with that. At that point he tolerated a 3 layer compression wrap which was approved by vascular, Dr. Tyrell Antonio office, to be used previous although 4-layer compression wrap is probably too much for him. 04/16/2019 on evaluation today patient appears to be doing decently well with regard to his lower extremity although he has had a lot of drainage from his leg. A lot of this I think is just secondary to the lymphedema with that being said he does have some odor as well which may again just be due to more of the excessive moisture although I think that he may also have something that is a result of bacteria again he has somewhat of a Pseudomonas-like smell to the drainage and fortunately though he is not showing  any signs of severe cellulitis he is still experiencing quite a bit of drainage and the discoloration is somewhat of a yellow/green in color which is consistent with Pseudomonas. If he can tolerated I think he may do well with Cipro. 04/24/2019 on evaluation today patient appears to be doing well with regard to his wound in the gluteal region which is healing quite well. That is excellent news. With that being said he is also doing better with regard to his left lower extremity overall I feel like things are definitely showing signs of improvement. Steven Meza, Steven Meza ([id number]) 12/28-Patient returns with regards to his gluteal wound that is improving, Left lower leg wounds with significant edema in the lower legs that appear to be persistent. Patient is in compression and has lymphedema pumps at home but apparently he does not use consistently 05/07/2019 Upon inspection patient's wound bed actually showed signs on evaluation today patient's wound currently showed signs of improvement. There is still a lot of maceration but it does look like that there is been some of this maceration occurring as a result of drainage in particular and some as a result of the fact that they have been putting bag balm on the wound area as well as the leg in general. I think this is applying too much moisture and not doing him any good. I discussed with the patient and his wife today that I think that practice needs to be adjusted I would recommend using a different type of lotion in order to help with moisture around the wound but I would not recommend anything directly on the  wound itself. 05/14/2019 on evaluation today patient actually appears to be doing much better in regard to his lower extremity. They have not been using any of the bag balm on the area I think this has made a tremendous improvement overall in his wounds. He has a lot of new skin growth and though there still are a lot of open wounds scattered  throughout the circumferential lower leg this overall seems to be doing much better to me. 05/21/2019 upon evaluation today patient appears to be doing well in regard to his lower extremity although he is having unfortunately some blue-green drainage noted at this point. The does have me concerned for a reinitiation of the Pseudomonas infection that we previously treated in December. The last time I gave him a prescription was April 16, 2019. That was a little over a month ago. Nonetheless he does have more green drainage noted today and I think this is indicative of potentially a revving up infection. Obviously we do not want to get to that point. 05/28/2019 on evaluation today patient appears to be doing better with regard to his lower extremity at this point. Overall I am seeing signs of improvement which is good news. Fortunately there is no evidence of active infection at this time. I do believe that the Cipro has been beneficial for him that was prescribed last week for 14 days he is halfway through that and already appears to be doing much better. This is excellent news. 06/04/2019 upon evaluation today I do believe that the patient is doing better with regard to his lower extremity. I feel like that he has much less in the way of open wounds and drainage and much in provement in regard to the overall weeping and even his swelling. There is no signs of erythema no obvious evidence of active infection all of which is great news. No fevers, chills, nausea, vomiting, or diarrhea. 06/11/2019 on evaluation today patient appears to be doing better with regard to his lower extremity on the left. He has been tolerating dressing changes without complication. Fortunately there is no signs of active infection at this time. No fevers, chills, nausea, vomiting, or diarrhea. 06/18/2019 upon evaluation today patient appears to be doing slightly worse compared to last week. They did call me on Friday due to the fact  that he had green discharge and both his wife as well as his home health nurse were concerned about what they were seeing. Subsequently I did actually send in a prescription for Cipro for 14 days for him and according to his wife this seems to be doing better based on what she is seeing today compared to Friday. This is great news. Home health is still coming out to see him on Wednesdays and Fridays as well. 06/25/2019 upon evaluation today patient appears to be doing somewhat better in regard to his leg ulcer. Fortunately there is no signs of active infection at this time which is good news. With that being said I am very pleased with how things seem to be going and overall I see no major issues here I think the alginate is doing well along with the XtraSorb. With regard to the patient's plantar foot he does have somewhat of a blood blister that appears to be resolving at this point his wife states that she feels like the issue is that he is not dropping his shoe in order to keep it from sliding when he walks. Subsequently this is leading to increases in  friction which of course is leading to more issues with getting this area to heal the patient is struggling in that regard. 07/02/2019 upon evaluation today patient appears to be making some progress in regard to his lower extremities. With that being said I really feel like the alginate may be getting too moist and then sitting on the skin causing some complications here for [place]. I discussed with the patient and his wife today as well the possibility of going ahead and discontinuing the alginate just utilizing the Xtrasorb to try to keep the edema under better control while at the same time preventing any dressings from staying too moist right on the surface of the wounds. Obviously I think this can do a good job in helping to dry things out but I would recommend that we give this a shot. 07/09/2019 upon evaluation today patient appears to be doing better  in regard to some of the edema and weeping in regard to the left lower extremity. Unfortunately he has not been wearing his compression of the right lower extremity and now that is open as well. He needs to obviously be wearing this although for today we will get a half to wrap him due to the fact that this is now open. I explained that he does need to be wearing his compression at all times. 07/16/2019 upon evaluation today patient actually does appear to be making some progress in regard to his lower extremity ulcers at this point. In fact he really does not have any ulcerations as much as he just does have lymphedema breakdown causing weeping. Nonetheless he is drying up quite a bit but overall seems to be doing much better. He has a lot of dry flaky skin I think we may want to add triamcinolone to the skin to try to help out with cutting back on some of the irritation here hopefully improve the overall skin quality. He does have Velcro compression wraps once he heals that is good news as well. Objective Constitutional Obese and well-hydrated in no acute distress. Steven Meza, Steven Meza ([id number]) Vitals Time Taken: 1:57 PM, Height: 65 in, Weight: 273 lbs, BMI: 45.4, Temperature: 98.7 F, Pulse: 73 bpm, Respiratory Rate: 18 breaths/min, Blood Pressure: 149/63 mmHg. Respiratory normal breathing without difficulty. Psychiatric this patient is able to make decisions and demonstrates good insight into disease process. Alert and Oriented x 3. pleasant and cooperative. General Notes: Upon inspection patient again has no definitive wounds just areas that are open and weeping although there are much fewer on the left compared to previous. Overall he seems to be doing quite well and very pleased in this regard. With that being said I do think that he needs to continue to try to elevate his legs much as possible also think the lymphedema will be well controlled with the elevation and wraps although he  needs to definitely be wearing his compression wraps once he heals were not there yet. Other Condition(s) Patient presents with Lymphedema located on the Left Leg. Patient presents with Lymphedema located on the Right Leg. Assessment Active Problems ICD-10 Lymphedema, not elsewhere classified Venous insufficiency (chronic) (peripheral) Non-pressure chronic ulcer of other part of left lower leg with fat layer exposed Essential (primary) hypertension Atherosclerotic heart disease of native coronary artery without angina pectoris Corns and callosities Procedures There was a Three Layer Compression Therapy Procedure by Army Melia, RN. Post procedure Diagnosis Wound #: Same as Pre-Procedure There was a Three Layer Compression Therapy Procedure by Army Melia,  RN. Post procedure Diagnosis Wound #: Same as Pre-Procedure Plan Wound Cleansing: Dial antibacterial soap, wash wounds, rinse and pat dry prior to dressing wounds May shower with protection. Skin Barriers/Peri-Wound Care: Triamcinolone Acetonide Ointment (TCA) Secondary Dressing: XtraSorb - over weeping areas Dressing Change Frequency: Change Dressing Monday, Wednesday, Friday - Monday in office, Wednesday and Friday by [facility] Follow-up Appointments: Return Appointment in 1 week. Steven Meza, Steven Meza ([id number]) Edema Control: 3 Layer Compression System - Left Lower Extremity Home Health: [place] Nurse may visit PRN to address patient s wound care needs. FACE TO FACE ENCOUNTER: MEDICARE and MEDICAID PATIENTS: I certify that this patient is under my care and that I had a face-to-face encounter that meets the physician face-to-face encounter requirements with this patient on this date. The encounter with the patient was in whole or in part for the following MEDICAL CONDITION: (primary reason for [place]) MEDICAL NECESSITY: I certify, that based on my findings, NURSING services are  a medically necessary home health service. HOME BOUND STATUS: I certify that my clinical findings support that this patient is homebound (i.e., Due to illness or injury, pt requires aid of supportive devices such as crutches, cane, wheelchairs, walkers, the use of special transportation or the assistance of another person to leave their place of residence. There is a normal inability to leave the home and doing so requires considerable and taxing effort. Other absences are for medical reasons / religious services and are infrequent or of short duration when for other reasons). If current dressing causes regression in wound condition, may D/C ordered dressing product/s and apply Normal Saline Moist Dressing daily until next [place] / Other MD appointment. [place] of regression in wound condition at [phone]. Please direct any NON-WOUND related issues/requests for orders to patient's Primary Care Physician The following medication(s) was prescribed: triamcinolone acetonide topical 0.1 % ointment ointment topical applied in a thin film over both legs with each dressing change as directed in the clinic. starting 07/16/2019 1. I would recommend currently that we initiate treatment with a continuation of the compression wraps currently along with Lauraine Rinne that is done very well for him. I am also going to recommend triamcinolone be applied to the bilateral lower extremity to help and try to improve the overall quality of his skin and the patient is in agreement with that plan. I will send this into the pharmacy for him today. 2. I am a recommend as well patient continue to try to elevate his legs as much as possible to help with his edema as well he seems to be coming to the realization that he is going to have to wear these wraps all the time. Obviously this is a good thing as that is what is going to keep him from having new areas opened up once we get them healed. We  will see patient back for reevaluation in 1 week here in the clinic. If anything worsens or changes patient will contact our office for additional recommendations. Electronic Signature(s) Signed: 07/16/2019 2:27:13 PM By: Worthy Keeler PA-C Entered By: Worthy Keeler on 07/16/2019 14:27:13 Steven Meza ([id number]) -------------------------------------------------------------------------------- SuperBill Details Patient Name: Steven Meza Date of Service: 07/16/2019 Medical Record Number: [id number] Patient Account Number: [id number] Date of Birth/Sex: Dec 27, 1930 (86 y.o. M) Treating RN: Army Melia Primary Care Provider: Fulton Reek Other Clinician: Referring Provider: Fulton Reek Treating Provider/Extender: Melburn Hake, Cecilio Ohlrich Weeks in Treatment: 15 Diagnosis Coding  ICD-10 Codes Meza Description I89.0 Lymphedema, not elsewhere classified I87.2 Venous insufficiency (chronic) (peripheral) L97.822 Non-pressure chronic ulcer of other part of left lower leg with fat layer exposed I10 Essential (primary) hypertension I25.10 Atherosclerotic heart disease of native coronary artery without angina pectoris L84 Corns and callosities Facility Procedures CPT4: Description Modifier Quantity Meza [id number] BILATERAL: Application of multi-layer venous compression system; leg (below knee), including ankle 1 and foot. Physician Procedures CPT4 Meza: [id number] Description: [id number] - WC PHYS LEVEL 4 - EST PT Modifier: Quantity: 1 CPT4 Meza: Description: ICD-10 Diagnosis Description I89.0 Lymphedema, not elsewhere classified I87.2 Venous insufficiency (chronic) (peripheral) L97.822 Non-pressure chronic ulcer of other part of left lower leg with fat layer e I10 Essential (primary)  hypertension Modifier: xposed Quantity: Electronic Signature(s) Signed: 07/16/2019 2:27:27 PM By: Worthy Keeler PA-C Entered By: Worthy Keeler on 07/16/2019 14:27:27

## 2019-07-23 ENCOUNTER — Other Ambulatory Visit: Payer: Self-pay

## 2019-07-23 ENCOUNTER — Encounter: Payer: Medicare Other | Admitting: Physician Assistant

## 2019-07-23 DIAGNOSIS — L97822 Non-pressure chronic ulcer of other part of left lower leg with fat layer exposed: Secondary | ICD-10-CM | POA: Diagnosis not present

## 2019-07-23 NOTE — Progress Notes (Addendum)
JAQUI, OFTEDAHL ([id number]) Visit Report for 07/23/2019 Chief Complaint Document Details Patient Name: Steven Meza Date of Service: 07/23/2019 1:45 PM Medical Record Number: [id number] Patient Account Number: [id number] Date of Birth/Sex: [birth year] (86 y.o. M) Treating RN: Army Melia Primary Care Provider: Fulton Meza Other Clinician: Referring Provider: Fulton Meza Treating Provider/Extender: Melburn Hake, Pernell Dikes Weeks in Treatment: 16 Information Obtained from: Patient Chief Complaint Left LE Ulcer Electronic Signature(s) Signed: 07/23/2019 1:56:01 PM By: Worthy Keeler PA-C Entered By: Worthy Meza on 07/23/2019 13:56:01 Steven Meza ([id number]) -------------------------------------------------------------------------------- HPI Details Patient Name: Steven Meza Date of Service: 07/23/2019 1:45 PM Medical Record Number: [id number] Patient Account Number: [id number] Date of Birth/Sex: [birth year] (86 y.o. M) Treating RN: Army Melia Primary Care Provider: Fulton Meza Other Clinician: Referring Provider: Fulton Meza Treating Provider/Extender: Melburn Hake, Jsoeph Podesta Weeks in Treatment: 16 History of Present Illness HPI Description: ADMISSION 05/17/2018 Steven Meza is an 86 year old man who is been treated at the lymphedema clinic for a long period with lymphedema wraps for bilateral lower extremity lymphedema. About 6 months ago they noted small open areas on his left lateral calf just above the ankle. These were open. They are apparently putting some form of ointment on this. They have been referred here for our review of this. The patient has a long history of lymphedema and venous stasis. It says in his records that he has recurrent cellulitis although his wife denies this but she states that the lymphedema may have started with cellulitis several years ago. Also in his record there is a history of bladder cancer which they seem to know very little about  however he did not have any radiation to the pelvis or anything that could have contributed to lymphedema that they are aware of. There is no prior wound history. The patient has 2 small punched out areas on the left lateral calf that have adherent debris on the surface. Past medical history; lymphedema, hypertension, cellulitis, hearing loss, morbid obesity, osteoarthritis, venous stasis, bladder CA, diverticulosis. ABIs in our clinic were 0.36 on the right and 0.57 on the left 05/24/2018; corrected age on this patient is 86 versus what I said last week. He has been for his arterial studies which predictably are not very good. On the right his ABI is 0.65. Monophasic waveforms noted at the right ankle including the posterior tibial and dorsalis pedis. He has triphasic waveforms of the common femoral and profunda femoris triphasic proximal SFA with monophasic distal FSA and popliteal artery. Monophasic tibial waveforms. On the left his ABI is 0.58 monophasic waveforms at the left ankle. Duplex of the left lower extremity demonstrated atherosclerotic change with monophasic waveforms throughout the left lower extremity. There was some concern about left iliac disease and potentially femoral-popliteal and tibial disease. The patient does not describe claudication although according to his wife he over emphasizes his activity. Patient states he is limited by pain in both knees His wounds are on the left lateral calf. 2 small areas with necrotic debris. We have been using silver collagen and light Ace wraps 05/31/2018; wounds on the left lateral calf in the setting of very significant lymphedema and probably PAD. We have been using silver collagen. The base of the small wound looks reasonably improved. I sent him to see Dr. Fletcher Anon however I see now he has an appointment with Dr. Alvester Chou on February 12 2/5; left lateral calf wound looks the same. His wife is doing a good job with her lymphedema wraps and  maintaining that the edema. He most likely has PAD and is due to see Dr. Gwenlyn Found of infectious disease on February 12 2/19; left lateral calf wounds look about the same. This looks like wound secondary to chronic venous insufficiency with lymphedema however the patient has very poor arterial studies. We referred him to Dr. Gwenlyn Found. Dr. Gwenlyn Found did not feel he needed to do anything from an arterial point of view. He did not address the question about the aggressiveness of compression. After some thoughts about this I elected to go ahead and put him in 3 layer compression which after all would be less compression then the lymphedema clinic was putting on this. I am hopeful that this should be enough to get some closure of the small wounds 2/26; left lateral calf wound letter this week. He tolerated the 3 layer compression well furthermore he is sleeping in a hospital bed which helps keep his legs up at night. The wound looks better measuring smaller especially in width 3/3; left lateral calf wound about the same size. The 3 layer compression has really helped get the edema down in his leg however. Using silver collagen 3/11; left lateral calf wound better looking wound surface but about the same size. We have been using 3 layer compression which is really helped get the edema down in the left leg. Even after Dr. Kennon Holter assessment I am reluctant to go to 4 layer compression. He is tolerating 3 layer compression well. We have been using silver collagen 3/18-Patient returns for his left lateral calf wound which overall is looking better, slightly increased in size and dimensions and area, he is tolerating the 3 layer compression, has been dressed with silver collagen which we will continue 3/25oleft lateral calf wound much better looking. Size is smaller. He has been using silver collagen 4/1; gradually getting smaller in size. Using silver collagen with 3 layer compression. I think the patient has some  degree of PAD. He saw Dr. Gwenlyn Found in consultation, he did not specifically comment on whether he could tolerate 4 layer compression 4/8; the wound is slightly smaller in depth. We are using 3 layer compression with silver collagen. I had him seen for arterial insufficiency however his ABI was 0.57 in the left leg. Dr. Gwenlyn Found really did not seem to middle on the degree of compression he could tolerate 4/15; the wound is slightly smaller and slightly less deep. We have been using 3 layer compression with silver collagen. There is not an option for 4-layer compression here. 4/22; the patient is making decent progress on his wound on the left lateral calf however he arrives in with a superficial wound on the right anterior tibial area. Were not really sure how this happened. He had been using a stocking on the right leg. He has been using silver collagen on the wounds Amazing to me he is he appears to have compression pumps at home. I am not really sure I knew this before. In any case he has not been using them 4/29; left lateral calf wound continues to get gradually smaller. This looks like it is on its way to closure. oThe area on the right anterior tibial area is about the same in terms of size superficial without any depth. This was probably wrap injury oHe tells me that he has been using his compression pumps once a day for 1 hour without any pain BREVON, Steven Meza ([id number]) 5/6; not as good today. Left lateral calf wound is actually larger and  he does not have quite as good edema control. The area on the right anterior tibia is about the same still superficial. He states he is using his compression pumps once a day. We are using 3 layer compression on the left and Curlex Coban on the right 5/13; the area on the right is very close to being healed. The area on the left still has some depth but has a healthy wound surface. He has excellent edema control bilaterally. Using Hydrofera Blue 5/20-Patient  returns in 1 week after being in 3 layer compression on the left, the left lateral calf ulcer is stable with some slough, the right anterior tibial ulcer is healed we have been using Kerlix Coban on that leg 5/27; Hydrofera Blue and 3 layer compression. Still some slough in the wound area and some nonviable edges around the wound. 6/3; this patient has lymphedema and chronic venous insufficiency. He had difficult to close wound on his left lateral calf. This is closed today. 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 Finally he has known PAD but he is managed to tolerate the compression we reporting on him and also using his compression pumps that he already had at home twice a day 6/10; we discharge this patient last week having finally close the small area in the left lateral calf. Apparently by Friday of last week this had opened and was draining. He is therefore back in clinic. His wife who is present also was concerned about an area on the right anterior tibia."o 6/17; the patient does not have an open area on the right leg. On the left he has a small circular area anteriorly and the same area laterally that he had last time. However both of these appear to be improved he has his Farrow wrap 30/40. It turns out that he is only using his external compression pumps once a day. I tried to get him up to twice a day today 6/24; the patient comes in with his external compression garment on the right leg/Farrow wrap. As far as we know no open wounds here. He has 2 areas that are smaller one on the left anterior pretibial and one on the left lateral calf which is the site of his initial wounds. I have successfully caught him into his external compression pumps twice a day 7/1; still using his external compression garment on the right leg. As far as we know there are no open wounds here. The area on the left lateral calf which was the site of his initial wound has  closed over. The area on the left anterior pretibial that we identified last week is still open. 7/8; as far as we know he has no open wounds on the right leg. He has his external compression garment on here. The original wound on the left lateral calf has closed over. He developed an area on the left anterior pretibial 2 weeks ago and seems to have 2 smaller areas on the proximal and distal lateral calf. We do not have his good edema control this week as I am used to seeing 7/15; the patient has still a linear area on the left anterior tibia. The area on the left lateral leg looked somewhat inflamed today but no cellulitis. He did have what looked to be a small pustule that I cultured for CandS although I did not give him empiric antibiotics. 7/22; small area on the left anterior tibia. The area on the left  lateral leg still looks inflamed. The pustule that I cultured last week grew Enterobacter and Pseudomonas. I prescribed Cipro they still have not picked that up. In any case the area actually looks fairly satisfactory. He has decent edema control using his compression pumps twice a day 7/29; the area on the left lateral leg looks a lot better than last week. Nothing is open here. He has an area just medial to the mid anterior tibia that is still open that is only open wound today. 8/5-Patient returns at 1 week for the left leg ulcer, which is actually looking better, he is also complaining of left plantar foot pain below the great toe. He does have a hematoma here 8/12-Patient returns at 1 week, the left leg open area looks about the same if not slightly better, the left plantar hematoma under the great toe is about the same, patient apparently has not been resting his foot on anything for too long period of time and does not wear the Birkenstock shoes unless he walks outdoors which he does not do often 8/19; small wound on the left anterior lower leg this is just about closed there is nothing on  the left lateral leg. He is using his compression pumps He arrives in clinic today with a black eschar over the right first plantar metatarsal head. This was apparently a bruise that was identified by her nurses last week. He wears crocs on his feet. He also may rub these on a footboard at home 8/26; there is nothing open on the left lateral lower leg he has a small area just medial to the tibia. He is going to see his podiatrist Dr. Milinda Pointer about the area on the left plantar first metatarsal head. He is using his external compression pumps. He has a juxta lite on the right leg 9/2; there is nothing open on either side of her left lower leg. He went to see podiatry about the area on the left first metatarsal head. This was apparently debrided. He was given mupirocin and oral antibiotics. I do not believe he is offloading this at all. Readmission: 04/02/2019 upon evaluation today patient actually appears for reevaluation here in our clinic secondary to issues that he is having with his left lower extremity. He has been tolerating the dressing changes without complication. Fortunately there is no signs of active infection at this time. No fevers, chills, nausea, vomiting, or diarrhea. He has been in the hospital since we last saw him and then subsequently transferred back to [facility]. He tells me that they have been changing his dressing once a day and that he has had a significant amount of pain secondary to undergoing these dressing changes. Fortunately there is no evidence of active infection at this point which is good news. No fevers, chills, nausea, vomiting, or diarrhea. With that being said he wonders if we can go back to doing what we were doing before which was when he actually had the dressing changes performed once a week here in the clinic he tells me that he did very well with that. At that point he tolerated a 3 layer compression wrap which was approved by vascular, Dr. Tyrell Antonio  office, to be used previous although 4-layer compression wrap is probably too much for him. 04/16/2019 on evaluation today patient appears to be doing decently well with regard to his lower extremity although he has had a lot of drainage from his leg. A lot of this I think is just secondary  to the lymphedema with that being said he does have some odor as well which may again just be due to more of the excessive moisture although I think that he may also have something that is a result of bacteria again he has somewhat of a Pseudomonas-like smell to the drainage and fortunately though he is not showing any signs of severe cellulitis he is still experiencing quite a bit of drainage and the discoloration is somewhat of a yellow/green in color which is consistent with Pseudomonas. If he can tolerated I think he may do well with Cipro. 04/24/2019 on evaluation today patient appears to be doing well with regard to his wound in the gluteal region which is healing quite well. That is excellent news. With that being said he is also doing better with regard to his left lower extremity overall I feel like things are definitely showing signs of improvement. 12/28-Patient returns with regards to his gluteal wound that is improving, Left lower leg wounds with significant edema in the lower legs that appear to be persistent. Patient is in compression and has lymphedema pumps at home but apparently he does not use consistently 05/07/2019 Upon inspection patient's wound bed actually showed signs on evaluation today patient's wound currently showed signs of improvement. Steven Meza, Steven Meza ([id number]) There is still a lot of maceration but it does look like that there is been some of this maceration occurring as a result of drainage in particular and some as a result of the fact that they have been putting bag balm on the wound area as well as the leg in general. I think this is applying too much moisture and not doing  him any good. I discussed with the patient and his wife today that I think that practice needs to be adjusted I would recommend using a different type of lotion in order to help with moisture around the wound but I would not recommend anything directly on the wound itself. 05/14/2019 on evaluation today patient actually appears to be doing much better in regard to his lower extremity. They have not been using any of the bag balm on the area I think this has made a tremendous improvement overall in his wounds. He has a lot of new skin growth and though there still are a lot of open wounds scattered throughout the circumferential lower leg this overall seems to be doing much better to me. 05/21/2019 upon evaluation today patient appears to be doing well in regard to his lower extremity although he is having unfortunately some blue-green drainage noted at this point. The does have me concerned for a reinitiation of the Pseudomonas infection that we previously treated in December. The last time I gave him a prescription was April 16, 2019. That was a little over a month ago. Nonetheless he does have more green drainage noted today and I think this is indicative of potentially a revving up infection. Obviously we do not want to get to that point. 05/28/2019 on evaluation today patient appears to be doing better with regard to his lower extremity at this point. Overall I am seeing signs of improvement which is good news. Fortunately there is no evidence of active infection at this time. I do believe that the Cipro has been beneficial for him that was prescribed last week for 14 days he is halfway through that and already appears to be doing much better. This is excellent news. 06/04/2019 upon evaluation today I do believe that the patient is  doing better with regard to his lower extremity. I feel like that he has much less in the way of open wounds and drainage and much in provement in regard to the overall  weeping and even his swelling. There is no signs of erythema no obvious evidence of active infection all of which is great news. No fevers, chills, nausea, vomiting, or diarrhea. 06/11/2019 on evaluation today patient appears to be doing better with regard to his lower extremity on the left. He has been tolerating dressing changes without complication. Fortunately there is no signs of active infection at this time. No fevers, chills, nausea, vomiting, or diarrhea. 06/18/2019 upon evaluation today patient appears to be doing slightly worse compared to last week. They did call me on Friday due to the fact that he had green discharge and both his wife as well as his home health nurse were concerned about what they were seeing. Subsequently I did actually send in a prescription for Cipro for 14 days for him and according to his wife this seems to be doing better based on what she is seeing today compared to Friday. This is great news. Home health is still coming out to see him on Wednesdays and Fridays as well. 06/25/2019 upon evaluation today patient appears to be doing somewhat better in regard to his leg ulcer. Fortunately there is no signs of active infection at this time which is good news. With that being said I am very pleased with how things seem to be going and overall I see no major issues here I think the alginate is doing well along with the XtraSorb. With regard to the patient's plantar foot he does have somewhat of a blood blister that appears to be resolving at this point his wife states that she feels like the issue is that he is not dropping his shoe in order to keep it from sliding when he walks. Subsequently this is leading to increases in friction which of course is leading to more issues with getting this area to heal the patient is struggling in that regard. 07/02/2019 upon evaluation today patient appears to be making some progress in regard to his lower extremities. With that being said I  really feel like the alginate may be getting too moist and then sitting on the skin causing some complications here for [place]. I discussed with the patient and his wife today as well the possibility of going ahead and discontinuing the alginate just utilizing the Xtrasorb to try to keep the edema under better control while at the same time preventing any dressings from staying too moist right on the surface of the wounds. Obviously I think this can do a good job in helping to dry things out but I would recommend that we give this a shot. 07/09/2019 upon evaluation today patient appears to be doing better in regard to some of the edema and weeping in regard to the left lower extremity. Unfortunately he has not been wearing his compression of the right lower extremity and now that is open as well. He needs to obviously be wearing this although for today we will get a half to wrap him due to the fact that this is now open. I explained that he does need to be wearing his compression at all times. 07/16/2019 upon evaluation today patient actually does appear to be making some progress in regard to his lower extremity ulcers at this point. In fact he really does not have any ulcerations as much  as he just does have lymphedema breakdown causing weeping. Nonetheless he is drying up quite a bit but overall seems to be doing much better. He has a lot of dry flaky skin I think we may want to add triamcinolone to the skin to try to help out with cutting back on some of the irritation here hopefully improve the overall skin quality. He does have Velcro compression wraps once he heals that is good news as well. 07/23/2019 upon evaluation today patient appears to be doing better with regard to his bilateral lower extremities. I think we are getting closer to getting everything sealed up. Again he does not have any significant wounds and his lymphedema has dramatically improved. Electronic Signature(s) Signed: 07/23/2019  2:04:44 PM By: Worthy Keeler PA-C Entered By: Worthy Meza on 07/23/2019 14:04:44 Steven Meza ([id number]) -------------------------------------------------------------------------------- Physical Exam Details Patient Name: Steven Meza Date of Service: 07/23/2019 1:45 PM Medical Record Number: [id number] Patient Account Number: [id number] Date of Birth/Sex: Nov 30, 1930 (86 y.o. M) Treating RN: Army Melia Primary Care Provider: Fulton Meza Other Clinician: Referring Provider: Fulton Meza Treating Provider/Extender: Melburn Hake, Stclair Szymborski Weeks in Treatment: 56 Constitutional Well-nourished and well-hydrated in no acute distress. Respiratory normal breathing without difficulty. Psychiatric this patient is able to make decisions and demonstrates good insight into disease process. Alert and Oriented x 3. pleasant and cooperative. Notes Patient's legs currently bilaterally seem to still be causing some trouble with small openings although these are dramatically improved and he is much less weeping than he used to. We will just use an XtraSorb to make sure that this area stays nice and dry. Electronic Signature(s) Signed: 07/23/2019 2:05:01 PM By: Worthy Keeler PA-C Entered By: Worthy Meza on 07/23/2019 14:05:01 Steven Meza ([id number]) -------------------------------------------------------------------------------- Physician Orders Details Patient Name: Steven Meza Date of Service: 07/23/2019 1:45 PM Medical Record Number: [id number] Patient Account Number: [id number] Date of Birth/Sex: 1931-02-28 (86 y.o. M) Treating RN: Army Melia Primary Care Provider: Fulton Meza Other Clinician: Referring Provider: Fulton Meza Treating Provider/Extender: Melburn Hake, Zarriah Starkel Weeks in Treatment: 16 Verbal / Phone Orders: No Diagnosis Coding ICD-10 Coding Meza Description I89.0 Lymphedema, not elsewhere classified I87.2 Venous insufficiency (chronic)  (peripheral) L97.822 Non-pressure chronic ulcer of other part of left lower leg with fat layer exposed I10 Essential (primary) hypertension I25.10 Atherosclerotic heart disease of native coronary artery without angina pectoris L84 Corns and callosities Wound Cleansing o Dial antibacterial soap, wash wounds, rinse and pat dry prior to dressing wounds o May shower with protection. Skin Barriers/Peri-Wound Care o Triamcinolone Acetonide Ointment (TCA) Secondary Dressing o XtraSorb - over weeping areas Dressing Change Frequency o Change Dressing Monday, Wednesday, Friday - Monday in office, Wednesday and Friday by [facility] Follow-up Appointments o Return Appointment in 1 week. Edema Control o 3 [facility] - [facility] [place] [facility]its - [facility] o Home Health Nurse may visit PRN to address patientos wound care needs. o FACE TO FACE ENCOUNTER: MEDICARE and MEDICAID PATIENTS: I certify that this patient is under my care and that I had a face-to- face encounter that meets the physician face-to-face encounter requirements with this patient on this date. The encounter with the patient was in whole or in part for the following MEDICAL CONDITION: (primary reason for [place]) MEDICAL NECESSITY: I certify, that based on my findings, NURSING services are a medically necessary home health service. HOME BOUND STATUS: I certify that my clinical findings support that this patient is  homebound (i.e., Due to illness or injury, pt requires aid of supportive devices such as crutches, cane, wheelchairs, walkers, the use of special transportation or the assistance of another person to leave their place of residence. There is a normal inability to leave the home and doing so requires considerable and taxing effort. Other absences are for medical reasons / religious services and are infrequent or of short duration when for other reasons). o If current  dressing causes regression in wound condition, may D/C ordered dressing product/s and apply Normal Saline Moist Dressing daily until next [place] / Other MD appointment. [place] of regression in wound condition at [phone]. o Please direct any NON-WOUND related issues/requests for orders to patient's Primary Care Physician Electronic Signature(s) Signed: 07/23/2019 4:14:01 PM By: Army Melia Signed: 07/23/2019 4:32:11 PM By: Worthy Keeler PA-C Entered By: Army Melia on 07/23/2019 14:01:01 Steven Meza ([id number]) -------------------------------------------------------------------------------- Problem List Details Patient Name: Steven Meza Date of Service: 07/23/2019 1:45 PM Medical Record Number: [id number] Patient Account Number: [id number] Date of Birth/Sex: 1930-08-12 (86 y.o. M) Treating RN: Army Melia Primary Care Provider: Fulton Meza Other Clinician: Referring Provider: Fulton Meza Treating Provider/Extender: Melburn Hake, Nil Xiong Weeks in Treatment: 16 Active Problems ICD-10 Evaluated Encounter Meza Description Active Date Today Diagnosis I89.0 Lymphedema, not elsewhere classified 04/02/2019 No Yes I87.2 Venous insufficiency (chronic) (peripheral) 04/02/2019 No Yes L97.822 Non-pressure chronic ulcer of other part of left lower leg with fat layer 04/02/2019 No Yes exposed [place] (primary) hypertension 04/02/2019 No Yes I25.10 Atherosclerotic heart disease of native coronary artery without angina 04/02/2019 No Yes pectoris L84 Corns and callosities 05/09/2019 No Yes Inactive Problems Resolved Problems Electronic Signature(s) Signed: 07/23/2019 1:55:53 PM By: Worthy Keeler PA-C Entered By: Worthy Meza on 07/23/2019 13:55:53 Steven Meza ([id number]) -------------------------------------------------------------------------------- Progress Note Details Patient Name: Steven Meza Date of Service:  07/23/2019 1:45 PM Medical Record Number: [id number] Patient Account Number: [id number] Date of Birth/Sex: 1930-07-17 (86 y.o. M) Treating RN: Army Melia Primary Care Provider: Fulton Meza Other Clinician: Referring Provider: Fulton Meza Treating Provider/Extender: Melburn Hake, Venisa [place]ampton Weeks in Treatment: 16 Subjective Chief Complaint Information obtained from Patient Left LE Ulcer History of Present Illness (HPI) ADMISSION 05/17/2018 Mr. Grgurich is an 86 year old man who is been treated at the lymphedema clinic for a long period with lymphedema wraps for bilateral lower extremity lymphedema. About 6 months ago they noted small open areas on his left lateral calf just above the ankle. These were open. They are apparently putting some form of ointment on this. They have been referred here for our review of this. The patient has a long history of lymphedema and venous stasis. It says in his records that he has recurrent cellulitis although his wife denies this but she states that the lymphedema may have started with cellulitis several years ago. Also in his record there is a history of bladder cancer which they seem to know very little about however he did not have any radiation to the pelvis or anything that could have contributed to lymphedema that they are aware of. There is no prior wound history. The patient has 2 small punched out areas on the left lateral calf that have adherent debris on the surface. Past medical history; lymphedema, hypertension, cellulitis, hearing loss, morbid obesity, osteoarthritis, venous stasis, bladder CA, diverticulosis. ABIs in our clinic were 0.36 on the right and 0.57 on the left 05/24/2018; corrected age on this patient is 20 versus what I  said last week. He has been for his arterial studies which predictably are not very good. On the right his ABI is 0.65. Monophasic waveforms noted at the right ankle including the posterior tibial and dorsalis pedis. He  has triphasic waveforms of the common femoral and profunda femoris triphasic proximal SFA with monophasic distal FSA and popliteal artery. Monophasic tibial waveforms. On the left his ABI is 0.58 monophasic waveforms at the left ankle. Duplex of the left lower extremity demonstrated atherosclerotic change with monophasic waveforms throughout the left lower extremity. There was some concern about left iliac disease and potentially femoral-popliteal and tibial disease. The patient does not describe claudication although according to his wife he over emphasizes his activity. Patient states he is limited by pain in both knees His wounds are on the left lateral calf. 2 small areas with necrotic debris. We have been using silver collagen and light Ace wraps 05/31/2018; wounds on the left lateral calf in the setting of very significant lymphedema and probably PAD. We have been using silver collagen. The base of the small wound looks reasonably improved. I sent him to see Dr. Fletcher Anon however I see now he has an appointment with Dr. Alvester Chou on February 12 2/5; left lateral calf wound looks the same. His wife is doing a good job with her lymphedema wraps and maintaining that the edema. He most likely has PAD and is due to see Dr. Gwenlyn Found of infectious disease on February 12 2/19; left lateral calf wounds look about the same. This looks like wound secondary to chronic venous insufficiency with lymphedema however the patient has very poor arterial studies. We referred him to Dr. Gwenlyn Found. Dr. Gwenlyn Found did not feel he needed to do anything from an arterial point of view. He did not address the question about the aggressiveness of compression. After some thoughts about this I elected to go ahead and put him in 3 layer compression which after all would be less compression then the lymphedema clinic was putting on this. I am hopeful that this should be enough to get some closure of the small wounds 2/26; left lateral calf  wound letter this week. He tolerated the 3 layer compression well furthermore he is sleeping in a hospital bed which helps keep his legs up at night. The wound looks better measuring smaller especially in width 3/3; left lateral calf wound about the same size. The 3 layer compression has really helped get the edema down in his leg however. Using silver collagen 3/11; left lateral calf wound better looking wound surface but about the same size. We have been using 3 layer compression which is really helped get the edema down in the left leg. Even after Dr. Kennon Holter assessment I am reluctant to go to 4 layer compression. He is tolerating 3 layer compression well. We have been using silver collagen 3/18-Patient returns for his left lateral calf wound which overall is looking better, slightly increased in size and dimensions and area, he is tolerating the 3 layer compression, has been dressed with silver collagen which we will continue 3/25 left lateral calf wound much better looking. Size is smaller. He has been using silver collagen 4/1; gradually getting smaller in size. Using silver collagen with 3 layer compression. I think the patient has some degree of PAD. He saw Dr. Gwenlyn Found in consultation, he did not specifically comment on whether he could tolerate 4 layer compression 4/8; the wound is slightly smaller in depth. We are using 3 layer compression with silver  collagen. I had him seen for arterial insufficiency however his ABI was 0.57 in the left leg. Dr. Gwenlyn Found really did not seem to middle on the degree of compression he could tolerate 4/15; the wound is slightly smaller and slightly less deep. We have been using 3 layer compression with silver collagen. There is not an option for 4-layer compression here. 4/22; the patient is making decent progress on his wound on the left lateral calf however he arrives in with a superficial wound on the right anterior tibial area. Were not really sure how this  happened. He had been using a stocking on the right leg. He has been using silver collagen on the wounds CRIT, SCHOR ([id number]) Amazing to me he is he appears to have compression pumps at home. I am not really sure I knew this before. In any case he has not been using them 4/29; left lateral calf wound continues to get gradually smaller. This looks like it is on its way to closure. The area on the right anterior tibial area is about the same in terms of size superficial without any depth. This was probably wrap injury He tells me that he has been using his compression pumps once a day for 1 hour without any pain 5/6; not as good today. Left lateral calf wound is actually larger and he does not have quite as good edema control. The area on the right anterior tibia is about the same still superficial. He states he is using his compression pumps once a day. We are using 3 layer compression on the left and Curlex Coban on the right 5/13; the area on the right is very close to being healed. The area on the left still has some depth but has a healthy wound surface. He has excellent edema control bilaterally. Using Hydrofera Blue 5/20-Patient returns in 1 week after being in 3 layer compression on the left, the left lateral calf ulcer is stable with some slough, the right anterior tibial ulcer is healed we have been using Kerlix Coban on that leg 5/27; Hydrofera Blue and 3 layer compression. Still some slough in the wound area and some nonviable edges around the wound. 6/3; this patient has lymphedema and chronic venous insufficiency. He had difficult to close wound on his left lateral calf. This is closed today. 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 Finally he has known PAD but he is managed to tolerate the compression we reporting on him and also using his compression pumps that he already had at home twice a day 6/10; we discharge this patient last week  having finally close the small area in the left lateral calf. Apparently by Friday of last week this had opened and was draining. He is therefore back in clinic. His wife who is present also was concerned about an area on the right anterior tibia."o 6/17; the patient does not have an open area on the right leg. On the left he has a small circular area anteriorly and the same area laterally that he had last time. However both of these appear to be improved he has his [place] wrap 30/40. It turns out that he is only using his external compression pumps once a day. I tried to get him up to twice a day today 6/24; the patient comes in with his external compression garment on the right leg/[place] wrap. As far as we know no open wounds here. He has 2 areas that  are smaller one on the left anterior pretibial and one on the left lateral calf which is the site of his initial wounds. I have successfully caught him into his external compression pumps twice a day 7/1; still using his external compression garment on the right leg. As far as we know there are no open wounds here. The area on the left lateral calf which was the site of his initial wound has closed over. The area on the left anterior pretibial that we identified last week is still open. 7/8; as far as we know he has no open wounds on the right leg. He has his external compression garment on here. The original wound on the left lateral calf has closed over. He developed an area on the left anterior pretibial 2 weeks ago and seems to have 2 smaller areas on the proximal and distal lateral calf. We do not have his good edema control this week as I am used to seeing 7/15; the patient has still a linear area on the left anterior tibia. The area on the left lateral leg looked somewhat inflamed today but no cellulitis. He did have what looked to be a small pustule that I cultured for CandS although I did not give him empiric antibiotics. 7/22; small area on  the left anterior tibia. The area on the left lateral leg still looks inflamed. The pustule that I cultured last week grew Enterobacter and Pseudomonas. I prescribed Cipro they still have not picked that up. In any case the area actually looks fairly satisfactory. He has decent edema control using his compression pumps twice a day 7/29; the area on the left lateral leg looks a lot better than last week. Nothing is open here. He has an area just medial to the mid anterior tibia that is still open that is only open wound today. 8/5-Patient returns at 1 week for the left leg ulcer, which is actually looking better, he is also complaining of left plantar foot pain below the great toe. He does have a hematoma here 8/12-Patient returns at 1 week, the left leg open area looks about the same if not slightly better, the left plantar hematoma under the great toe is about the same, patient apparently has not been resting his foot on anything for too long period of time and does not wear the [employer] shoes unless he walks outdoors which he does not do often 8/19; small wound on the left anterior lower leg this is just about closed there is nothing on the left lateral leg. He is using his compression pumps He arrives in clinic today with a black eschar over the right first plantar metatarsal head. This was apparently a bruise that was identified by her nurses last week. He wears crocs on his feet. He also may rub these on a footboard at home 8/26; there is nothing open on the left lateral lower leg he has a small area just medial to the tibia. He is going to see his podiatrist Dr. Milinda Pointer about the area on the left plantar first metatarsal head. He is using his external compression pumps. He has a juxta lite on the right leg 9/2; there is nothing open on either side of her left lower leg. He went to see podiatry about the area on the left first metatarsal head. This was apparently debrided. He was given  mupirocin and oral antibiotics. I do not believe he is offloading this at all. Readmission: 04/02/2019 upon evaluation today patient actually  appears for reevaluation here in our clinic secondary to issues that he is having with his left lower extremity. He has been tolerating the dressing changes without complication. Fortunately there is no signs of active infection at this time. No fevers, chills, nausea, vomiting, or diarrhea. He has been in the hospital since we last saw him and then subsequently transferred back to [facility]. He tells me that they have been changing his dressing once a day and that he has had a significant amount of pain secondary to undergoing these dressing changes. Fortunately there is no evidence of active infection at this point which is good news. No fevers, chills, nausea, vomiting, or diarrhea. With that being said he wonders if we can go back to doing what we were doing before which was when he actually had the dressing changes performed once a week here in the clinic he tells me that he did very well with that. At that point he tolerated a 3 layer compression wrap which was approved by vascular, Dr. Tyrell Antonio office, to be used previous although 4-layer compression wrap is probably too much for him. 04/16/2019 on evaluation today patient appears to be doing decently well with regard to his lower extremity although he has had a lot of drainage from his leg. A lot of this I think is just secondary to the lymphedema with that being said he does have some odor as well which may again just be due to more of the excessive moisture although I think that he may also have something that is a result of bacteria again he has somewhat of a Pseudomonas-like smell to the drainage and fortunately though he is not showing any signs of severe cellulitis he is still experiencing quite a bit of drainage and the discoloration is somewhat of a yellow/green in color which is consistent  with Pseudomonas. If he can tolerated I think he may do well with Cipro. 04/24/2019 on evaluation today patient appears to be doing well with regard to his wound in the gluteal region which is healing quite well. That is excellent news. With that being said he is also doing better with regard to his left lower extremity overall I feel like things are definitely showing signs of improvement. Steven Meza, Steven Meza ([id number]) 12/28-Patient returns with regards to his gluteal wound that is improving, Left lower leg wounds with significant edema in the lower legs that appear to be persistent. Patient is in compression and has lymphedema pumps at home but apparently he does not use consistently 05/07/2019 Upon inspection patient's wound bed actually showed signs on evaluation today patient's wound currently showed signs of improvement. There is still a lot of maceration but it does look like that there is been some of this maceration occurring as a result of drainage in particular and some as a result of the fact that they have been putting bag balm on the wound area as well as the leg in general. I think this is applying too much moisture and not doing him any good. I discussed with the patient and his wife today that I think that practice needs to be adjusted I would recommend using a different type of lotion in order to help with moisture around the wound but I would not recommend anything directly on the wound itself. 05/14/2019 on evaluation today patient actually appears to be doing much better in regard to his lower extremity. They have not been using any of the bag balm on the area I  think this has made a tremendous improvement overall in his wounds. He has a lot of new skin growth and though there still are a lot of open wounds scattered throughout the circumferential lower leg this overall seems to be doing much better to me. 05/21/2019 upon evaluation today patient appears to be doing well in regard to  his lower extremity although he is having unfortunately some blue-green drainage noted at this point. The does have me concerned for a reinitiation of the Pseudomonas infection that we previously treated in December. The last time I gave him a prescription was April 16, 2019. That was a little over a month ago. Nonetheless he does have more green drainage noted today and I think this is indicative of potentially a revving up infection. Obviously we do not want to get to that point. 05/28/2019 on evaluation today patient appears to be doing better with regard to his lower extremity at this point. Overall I am seeing signs of improvement which is good news. Fortunately there is no evidence of active infection at this time. I do believe that the Cipro has been beneficial for him that was prescribed last week for 14 days he is halfway through that and already appears to be doing much better. This is excellent news. 06/04/2019 upon evaluation today I do believe that the patient is doing better with regard to his lower extremity. I feel like that he has much less in the way of open wounds and drainage and much in provement in regard to the overall weeping and even his swelling. There is no signs of erythema no obvious evidence of active infection all of which is great news. No fevers, chills, nausea, vomiting, or diarrhea. 06/11/2019 on evaluation today patient appears to be doing better with regard to his lower extremity on the left. He has been tolerating dressing changes without complication. Fortunately there is no signs of active infection at this time. No fevers, chills, nausea, vomiting, or diarrhea. 06/18/2019 upon evaluation today patient appears to be doing slightly worse compared to last week. They did call me on Friday due to the fact that he had green discharge and both his wife as well as his home health nurse were concerned about what they were seeing. Subsequently I did actually send in a  prescription for Cipro for 14 days for him and according to his wife this seems to be doing better based on what she is seeing today compared to Friday. This is great news. Home health is still coming out to see him on Wednesdays and Fridays as well. 06/25/2019 upon evaluation today patient appears to be doing somewhat better in regard to his leg ulcer. Fortunately there is no signs of active infection at this time which is good news. With that being said I am very pleased with how things seem to be going and overall I see no major issues here I think the alginate is doing well along with the XtraSorb. With regard to the patient's plantar foot he does have somewhat of a blood blister that appears to be resolving at this point his wife states that she feels like the issue is that he is not dropping his shoe in order to keep it from sliding when he walks. Subsequently this is leading to increases in friction which of course is leading to more issues with getting this area to heal the patient is struggling in that regard. 07/02/2019 upon evaluation today patient appears to be making some progress in  regard to his lower extremities. With that being said I really feel like the alginate may be getting too moist and then sitting on the skin causing some complications here for [place]. I discussed with the patient and his wife today as well the possibility of going ahead and discontinuing the alginate just utilizing the Xtrasorb to try to keep the edema under better control while at the same time preventing any dressings from staying too moist right on the surface of the wounds. Obviously I think this can do a good job in helping to dry things out but I would recommend that we give this a shot. 07/09/2019 upon evaluation today patient appears to be doing better in regard to some of the edema and weeping in regard to the left lower extremity. Unfortunately he has not been wearing his compression of the right lower  extremity and now that is open as well. He needs to obviously be wearing this although for today we will get a half to wrap him due to the fact that this is now open. I explained that he does need to be wearing his compression at all times. 07/16/2019 upon evaluation today patient actually does appear to be making some progress in regard to his lower extremity ulcers at this point. In fact he really does not have any ulcerations as much as he just does have lymphedema breakdown causing weeping. Nonetheless he is drying up quite a bit but overall seems to be doing much better. He has a lot of dry flaky skin I think we may want to add triamcinolone to the skin to try to help out with cutting back on some of the irritation here hopefully improve the overall skin quality. He does have Velcro compression wraps once he heals that is good news as well. 07/23/2019 upon evaluation today patient appears to be doing better with regard to his bilateral lower extremities. I think we are getting closer to getting everything sealed up. Again he does not have any significant wounds and his lymphedema has dramatically improved. Objective Steven Meza, Steven Meza ([id number]) Constitutional Well-nourished and well-hydrated in no acute distress. Vitals Time Taken: 1:48 PM, Height: 65 in, Weight: 273 lbs, BMI: 45.4, Temperature: 98.5 F, Pulse: 71 bpm, Respiratory Rate: 16 breaths/min, Blood Pressure: 145/55 mmHg. Respiratory normal breathing without difficulty. Psychiatric this patient is able to make decisions and demonstrates good insight into disease process. Alert and Oriented x 3. pleasant and cooperative. General Notes: Patient's legs currently bilaterally seem to still be causing some trouble with small openings although these are dramatically improved and he is much less weeping than he used to. We will just use an XtraSorb to make sure that this area stays nice and dry. Assessment Active  Problems ICD-10 Lymphedema, not elsewhere classified Venous insufficiency (chronic) (peripheral) Non-pressure chronic ulcer of other part of left lower leg with fat layer exposed Essential (primary) hypertension Atherosclerotic heart disease of native coronary artery without angina pectoris Corns and callosities Procedures There was a Three Layer Compression Therapy Procedure by Army Melia, RN. Post procedure Diagnosis Wound #: Same as Pre-Procedure There was a Three Layer Compression Therapy Procedure by Army Melia, RN. Post procedure Diagnosis Wound #: Same as Pre-Procedure Plan Wound Cleansing: Dial antibacterial soap, wash wounds, rinse and pat dry prior to dressing wounds May shower with protection. Skin Barriers/Peri-Wound Care: Triamcinolone Acetonide Ointment (TCA) Secondary Dressing: XtraSorb - over weeping areas Dressing Change Frequency: Change Dressing Monday, Wednesday, Friday - Monday in office, Wednesday and Friday  by [facility] Follow-up Appointments: Return Appointment in 1 week. Edema Control: 3 Layer Compression System - Bilateral Home Health: [facility] Steven Meza, Steven Meza ([id number]) Home Health Nurse may visit PRN to address patient s wound care needs. FACE TO FACE ENCOUNTER: MEDICARE and MEDICAID PATIENTS: I certify that this patient is under my care and that I had a face-to-face encounter that meets the physician face-to-face encounter requirements with this patient on this date. The encounter with the patient was in whole or in part for the following MEDICAL CONDITION: (primary reason for [place]) MEDICAL NECESSITY: I certify, that based on my findings, NURSING services are a medically necessary home health service. HOME BOUND STATUS: I certify that my clinical findings support that this patient is homebound (i.e., Due to illness or injury, pt requires aid of supportive devices such as crutches, cane, wheelchairs, walkers, the  use of special transportation or the assistance of another person to leave their place of residence. There is a normal inability to leave the home and doing so requires considerable and taxing effort. Other absences are for medical reasons / religious services and are infrequent or of short duration when for other reasons). If current dressing causes regression in wound condition, may D/C ordered dressing product/s and apply Normal Saline Moist Dressing daily until next [place] / Other MD appointment. [place] of regression in wound condition at [phone]. Please direct any NON-WOUND related issues/requests for orders to patient's Primary Care Physician 1., Recommend currently that we continue with the XtraSorb for the time being. The patient is in agreement with that plan. 2. I am also can recommend that we continue with the compression wraps currently utilizing a 3 layer compression wrap bilaterally. We will also use triamcinolone to the legs bilaterally. 3. The patient does need to continue to elevate his legs is much as possible obviously the more he can do this the better off he will do as well. We will see patient back for reevaluation in 1 week here in the clinic. If anything worsens or changes patient will contact our office for additional recommendations. Electronic Signature(s) Signed: 07/23/2019 2:05:37 PM By: Worthy Keeler PA-C Entered By: Worthy Meza on 07/23/2019 14:05:36 Steven Meza ([id number]) -------------------------------------------------------------------------------- SuperBill Details Patient Name: Steven Meza Date of Service: 07/23/2019 Medical Record Number: [id number] Patient Account Number: [id number] Date of Birth/Sex: Dec 06, 1930 (86 y.o. M) Treating RN: Army Melia Primary Care Provider: F[place]on Meza Other Clinician: Referring Provider: [place] Meza Treating Provider/Extender: Melburn Hake, Kimberley Dastrup Weeks in  Treatment: 16 Diagnosis Coding ICD-10 Codes Meza Description I89.0 Lymphedema, not elsewhere classified I87.2 Venous insufficiency (chronic) (peripheral) L97.822 Non-pressure chronic ulcer of other part of left lower leg with fat layer exposed I10 Essential (primary) hypertension I25.10 Atherosclerotic heart disease of native coronary artery without angina pectoris L84 Corns and callosities Facility Procedures CPT4: Description Modifier Quantity Meza [id number] BILATERAL: Application of multi-layer venous compression system; leg (below knee), including ankle 1 and foot. Physician Procedures CPT4 Meza: [id number] Description: [id number] - WC PHYS LEVEL 3 - EST PT Modifier: Quantity: 1 CPT4 Meza: Description: ICD-10 Diagnosis Description I89.0 Lymphedema, not elsewhere classified I87.2 Venous insufficiency (chronic) (peripheral) L97.822 Non-pressure chronic ulcer of other part of left lower leg with fat layer e Modifier: xposed Quantity: Electronic Signature(s) Signed: 07/23/2019 2:05:52 PM By: Worthy Keeler PA-C Entered By: Worthy Meza on 07/23/2019 14:05:51

## 2019-07-30 ENCOUNTER — Encounter: Payer: Medicare Other | Admitting: Physician Assistant

## 2019-07-30 ENCOUNTER — Other Ambulatory Visit: Payer: Self-pay

## 2019-07-30 DIAGNOSIS — L97822 Non-pressure chronic ulcer of other part of left lower leg with fat layer exposed: Secondary | ICD-10-CM | POA: Diagnosis not present

## 2019-07-30 NOTE — Progress Notes (Addendum)
Steven Meza, Steven Meza ([id number]) Visit Report for 07/30/2019 Chief Complaint Document Details Patient Name: Steven Meza, Steven Meza Date of Service: 07/30/2019 1:45 PM Medical Record Number: [id number] Patient Account Number: [id number] Date of Birth/Sex: 1930-08-17 (86 y.o. M) Treating RN: Army Melia Primary Care Provider: Fulton Reek Other Clinician: Referring Provider: Fulton Reek Treating Provider/Extender: Melburn Hake, Evo Aderman Weeks in Treatment: 17 Information Obtained from: Patient Chief Complaint Left LE Ulcer Electronic Signature(s) Signed: 07/30/2019 2:22:42 PM By: Worthy Keeler PA-C Entered By: Worthy Keeler on 07/30/2019 14:22:41 Steven Meza ([id number]) -------------------------------------------------------------------------------- HPI Details Patient Name: Steven Meza Date of Service: 07/30/2019 1:45 PM Medical Record Number: [id number] Patient Account Number: [id number] Date of Birth/Sex: March 26, 1931 (86 y.o. M) Treating RN: Army Melia Primary Care Provider: Fulton Reek Other Clinician: Referring Provider: Fulton Reek Treating Provider/Extender: Melburn Hake, Javonne Louissaint Weeks in Treatment: 17 History of Present Illness HPI Description: ADMISSION 05/17/2018 Mr. Petitte is an 86 year old man who is been treated at the lymphedema clinic for a long period with lymphedema wraps for bilateral lower extremity lymphedema. About 6 months ago they noted small open areas on his left lateral calf just above the ankle. These were open. They are apparently putting some form of ointment on this. They have been referred here for our review of this. The patient has a long history of lymphedema and venous stasis. It says in his records that he has recurrent cellulitis although his wife denies this but she states that the lymphedema may have started with cellulitis several years ago. Also in his record there is a history of bladder cancer which they seem to know very little about  however he did not have any radiation to the pelvis or anything that could have contributed to lymphedema that they are aware of. There is no prior wound history. The patient has 2 small punched out areas on the left lateral calf that have adherent debris on the surface. Past medical history; lymphedema, hypertension, cellulitis, hearing loss, morbid obesity, osteoarthritis, venous stasis, bladder CA, diverticulosis. ABIs in our clinic were 0.36 on the right and 0.57 on the left 05/24/2018; corrected age on this patient is 4 versus what I said last week. He has been for his arterial studies which predictably are not very good. On the right his ABI is 0.65. Monophasic waveforms noted at the right ankle including the posterior tibial and dorsalis pedis. He has triphasic waveforms of the common femoral and profunda femoris triphasic proximal SFA with monophasic distal FSA and popliteal artery. Monophasic tibial waveforms. On the left his ABI is 0.58 monophasic waveforms at the left ankle. Duplex of the left lower extremity demonstrated atherosclerotic change with monophasic waveforms throughout the left lower extremity. There was some concern about left iliac disease and potentially femoral-popliteal and tibial disease. The patient does not describe claudication although according to his wife he over emphasizes his activity. Patient states he is limited by pain in both knees His wounds are on the left lateral calf. 2 small areas with necrotic debris. We have been using silver collagen and light Ace wraps 05/31/2018; wounds on the left lateral calf in the setting of very significant lymphedema and probably PAD. We have been using silver collagen. The base of the small wound looks reasonably improved. I sent him to see Dr. Fletcher Anon however I see now he has an appointment with Dr. Alvester Chou on February 12 2/5; left lateral calf wound looks the same. His wife is doing a good job with her lymphedema wraps and  maintaining that the edema. He most likely has PAD and is due to see Dr. Gwenlyn Found of infectious disease on February 12 2/19; left lateral calf wounds look about the same. This looks like wound secondary to chronic venous insufficiency with lymphedema however the patient has very poor arterial studies. We referred him to Dr. Gwenlyn Found. Dr. Gwenlyn Found did not feel he needed to do anything from an arterial point of view. He did not address the question about the aggressiveness of compression. After some thoughts about this I elected to go ahead and put him in 3 layer compression which after all would be less compression then the lymphedema clinic was putting on this. I am hopeful that this should be enough to get some closure of the small wounds 2/26; left lateral calf wound letter this week. He tolerated the 3 layer compression well furthermore he is sleeping in a hospital bed which helps keep his legs up at night. The wound looks better measuring smaller especially in width 3/3; left lateral calf wound about the same size. The 3 layer compression has really helped get the edema down in his leg however. Using silver collagen 3/11; left lateral calf wound better looking wound surface but about the same size. We have been using 3 layer compression which is really helped get the edema down in the left leg. Even after Dr. Kennon Holter assessment I am reluctant to go to 4 layer compression. He is tolerating 3 layer compression well. We have been using silver collagen 3/18-Patient returns for his left lateral calf wound which overall is looking better, slightly increased in size and dimensions and area, he is tolerating the 3 layer compression, has been dressed with silver collagen which we will continue 3/25oleft lateral calf wound much better looking. Size is smaller. He has been using silver collagen 4/1; gradually getting smaller in size. Using silver collagen with 3 layer compression. I think the patient has some  degree of PAD. He saw Dr. Gwenlyn Found in consultation, he did not specifically comment on whether he could tolerate 4 layer compression 4/8; the wound is slightly smaller in depth. We are using 3 layer compression with silver collagen. I had him seen for arterial insufficiency however his ABI was 0.57 in the left leg. Dr. Gwenlyn Found really did not seem to middle on the degree of compression he could tolerate 4/15; the wound is slightly smaller and slightly less deep. We have been using 3 layer compression with silver collagen. There is not an option for 4-layer compression here. 4/22; the patient is making decent progress on his wound on the left lateral calf however he arrives in with a superficial wound on the right anterior tibial area. Were not really sure how this happened. He had been using a stocking on the right leg. He has been using silver collagen on the wounds Amazing to me he is he appears to have compression pumps at home. I am not really sure I knew this before. In any case he has not been using them 4/29; left lateral calf wound continues to get gradually smaller. This looks like it is on its way to closure. oThe area on the right anterior tibial area is about the same in terms of size superficial without any depth. This was probably wrap injury oHe tells me that he has been using his compression pumps once a day for 1 hour without any pain Steven Meza, Steven Meza ([id number]) 5/6; not as good today. Left lateral calf wound is actually larger and  he does not have quite as good edema control. The area on the right anterior tibia is about the same still superficial. He states he is using his compression pumps once a day. We are using 3 layer compression on the left and Curlex Coban on the right 5/13; the area on the right is very close to being healed. The area on the left still has some depth but has a healthy wound surface. He has excellent edema control bilaterally. Using Hydrofera Blue 5/20-Patient  returns in 1 week after being in 3 layer compression on the left, the left lateral calf ulcer is stable with some slough, the right anterior tibial ulcer is healed we have been using Kerlix Coban on that leg 5/27; Hydrofera Blue and 3 layer compression. Still some slough in the wound area and some nonviable edges around the wound. 6/3; this patient has lymphedema and chronic venous insufficiency. He had difficult to close wound on his left lateral calf. This is closed today. 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 Finally he has known PAD but he is managed to tolerate the compression we reporting on him and also using his compression pumps that he already had at home twice a day 6/10; we discharge this patient last week having finally close the small area in the left lateral calf. Apparently by Friday of last week this had opened and was draining. He is therefore back in clinic. His wife who is present also was concerned about an area on the right anterior tibia."o 6/17; the patient does not have an open area on the right leg. On the left he has a small circular area anteriorly and the same area laterally that he had last time. However both of these appear to be improved he has his Farrow wrap 30/40. It turns out that he is only using his external compression pumps once a day. I tried to get him up to twice a day today 6/24; the patient comes in with his external compression garment on the right leg/Farrow wrap. As far as we know no open wounds here. He has 2 areas that are smaller one on the left anterior pretibial and one on the left lateral calf which is the site of his initial wounds. I have successfully caught him into his external compression pumps twice a day 7/1; still using his external compression garment on the right leg. As far as we know there are no open wounds here. The area on the left lateral calf which was the site of his initial wound has  closed over. The area on the left anterior pretibial that we identified last week is still open. 7/8; as far as we know he has no open wounds on the right leg. He has his external compression garment on here. The original wound on the left lateral calf has closed over. He developed an area on the left anterior pretibial 2 weeks ago and seems to have 2 smaller areas on the proximal and distal lateral calf. We do not have his good edema control this week as I am used to seeing 7/15; the patient has still a linear area on the left anterior tibia. The area on the left lateral leg looked somewhat inflamed today but no cellulitis. He did have what looked to be a small pustule that I cultured for CandS although I did not give him empiric antibiotics. 7/22; small area on the left anterior tibia. The area on the left  lateral leg still looks inflamed. The pustule that I cultured last week grew Enterobacter and Pseudomonas. I prescribed Cipro they still have not picked that up. In any case the area actually looks fairly satisfactory. He has decent edema control using his compression pumps twice a day 7/29; the area on the left lateral leg looks a lot better than last week. Nothing is open here. He has an area just medial to the mid anterior tibia that is still open that is only open wound today. 8/5-Patient returns at 1 week for the left leg ulcer, which is actually looking better, he is also complaining of left plantar foot pain below the great toe. He does have a hematoma here 8/12-Patient returns at 1 week, the left leg open area looks about the same if not slightly better, the left plantar hematoma under the great toe is about the same, patient apparently has not been resting his foot on anything for too long period of time and does not wear the Birkenstock shoes unless he walks outdoors which he does not do often 8/19; small wound on the left anterior lower leg this is just about closed there is nothing on  the left lateral leg. He is using his compression pumps He arrives in clinic today with a black eschar over the right first plantar metatarsal head. This was apparently a bruise that was identified by her nurses last week. He wears crocs on his feet. He also may rub these on a footboard at home 8/26; there is nothing open on the left lateral lower leg he has a small area just medial to the tibia. He is going to see his podiatrist Dr. Milinda Pointer about the area on the left plantar first metatarsal head. He is using his external compression pumps. He has a juxta lite on the right leg 9/2; there is nothing open on either side of her left lower leg. He went to see podiatry about the area on the left first metatarsal head. This was apparently debrided. He was given mupirocin and oral antibiotics. I do not believe he is offloading this at all. Readmission: 04/02/2019 upon evaluation today patient actually appears for reevaluation here in our clinic secondary to issues that he is having with his left lower extremity. He has been tolerating the dressing changes without complication. Fortunately there is no signs of active infection at this time. No fevers, chills, nausea, vomiting, or diarrhea. He has been in the hospital since we last saw him and then subsequently transferred back to [facility]. He tells me that they have been changing his dressing once a day and that he has had a significant amount of pain secondary to undergoing these dressing changes. Fortunately there is no evidence of active infection at this point which is good news. No fevers, chills, nausea, vomiting, or diarrhea. With that being said he wonders if we can go back to doing what we were doing before which was when he actually had the dressing changes performed once a week here in the clinic he tells me that he did very well with that. At that point he tolerated a 3 layer compression wrap which was approved by vascular, Dr. Tyrell Antonio  office, to be used previous although 4-layer compression wrap is probably too much for him. 04/16/2019 on evaluation today patient appears to be doing decently well with regard to his lower extremity although he has had a lot of drainage from his leg. A lot of this I think is just secondary  to the lymphedema with that being said he does have some odor as well which may again just be due to more of the excessive moisture although I think that he may also have something that is a result of bacteria again he has somewhat of a Pseudomonas-like smell to the drainage and fortunately though he is not showing any signs of severe cellulitis he is still experiencing quite a bit of drainage and the discoloration is somewhat of a yellow/green in color which is consistent with Pseudomonas. If he can tolerated I think he may do well with Cipro. 04/24/2019 on evaluation today patient appears to be doing well with regard to his wound in the gluteal region which is healing quite well. That is excellent news. With that being said he is also doing better with regard to his left lower extremity overall I feel like things are definitely showing signs of improvement. 12/28-Patient returns with regards to his gluteal wound that is improving, Left lower leg wounds with significant edema in the lower legs that appear to be persistent. Patient is in compression and has lymphedema pumps at home but apparently he does not use consistently 05/07/2019 Upon inspection patient's wound bed actually showed signs on evaluation today patient's wound currently showed signs of improvement. Steven Meza, Steven Meza ([id number]) There is still a lot of maceration but it does look like that there is been some of this maceration occurring as a result of drainage in particular and some as a result of the fact that they have been putting bag balm on the wound area as well as the leg in general. I think this is applying too much moisture and not doing  him any good. I discussed with the patient and his wife today that I think that practice needs to be adjusted I would recommend using a different type of lotion in order to help with moisture around the wound but I would not recommend anything directly on the wound itself. 05/14/2019 on evaluation today patient actually appears to be doing much better in regard to his lower extremity. They have not been using any of the bag balm on the area I think this has made a tremendous improvement overall in his wounds. He has a lot of new skin growth and though there still are a lot of open wounds scattered throughout the circumferential lower leg this overall seems to be doing much better to me. 05/21/2019 upon evaluation today patient appears to be doing well in regard to his lower extremity although he is having unfortunately some blue-green drainage noted at this point. The does have me concerned for a reinitiation of the Pseudomonas infection that we previously treated in December. The last time I gave him a prescription was April 16, 2019. That was a little over a month ago. Nonetheless he does have more green drainage noted today and I think this is indicative of potentially a revving up infection. Obviously we do not want to get to that point. 05/28/2019 on evaluation today patient appears to be doing better with regard to his lower extremity at this point. Overall I am seeing signs of improvement which is good news. Fortunately there is no evidence of active infection at this time. I do believe that the Cipro has been beneficial for him that was prescribed last week for 14 days he is halfway through that and already appears to be doing much better. This is excellent news. 06/04/2019 upon evaluation today I do believe that the patient is  doing better with regard to his lower extremity. I feel like that he has much less in the way of open wounds and drainage and much in provement in regard to the overall  weeping and even his swelling. There is no signs of erythema no obvious evidence of active infection all of which is great news. No fevers, chills, nausea, vomiting, or diarrhea. 06/11/2019 on evaluation today patient appears to be doing better with regard to his lower extremity on the left. He has been tolerating dressing changes without complication. Fortunately there is no signs of active infection at this time. No fevers, chills, nausea, vomiting, or diarrhea. 06/18/2019 upon evaluation today patient appears to be doing slightly worse compared to last week. They did call me on Friday due to the fact that he had green discharge and both his wife as well as his home health nurse were concerned about what they were seeing. Subsequently I did actually send in a prescription for Cipro for 14 days for him and according to his wife this seems to be doing better based on what she is seeing today compared to Friday. This is great news. Home health is still coming out to see him on Wednesdays and Fridays as well. 06/25/2019 upon evaluation today patient appears to be doing somewhat better in regard to his leg ulcer. Fortunately there is no signs of active infection at this time which is good news. With that being said I am very pleased with how things seem to be going and overall I see no major issues here I think the alginate is doing well along with the XtraSorb. With regard to the patient's plantar foot he does have somewhat of a blood blister that appears to be resolving at this point his wife states that she feels like the issue is that he is not dropping his shoe in order to keep it from sliding when he walks. Subsequently this is leading to increases in friction which of course is leading to more issues with getting this area to heal the patient is struggling in that regard. 07/02/2019 upon evaluation today patient appears to be making some progress in regard to his lower extremities. With that being said I  really feel like the alginate may be getting too moist and then sitting on the skin causing some complications here for [place]. I discussed with the patient and his wife today as well the possibility of going ahead and discontinuing the alginate just utilizing the Xtrasorb to try to keep the edema under better control while at the same time preventing any dressings from staying too moist right on the surface of the wounds. Obviously I think this can do a good job in helping to dry things out but I would recommend that we give this a shot. 07/09/2019 upon evaluation today patient appears to be doing better in regard to some of the edema and weeping in regard to the left lower extremity. Unfortunately he has not been wearing his compression of the right lower extremity and now that is open as well. He needs to obviously be wearing this although for today we will get a half to wrap him due to the fact that this is now open. I explained that he does need to be wearing his compression at all times. 07/16/2019 upon evaluation today patient actually does appear to be making some progress in regard to his lower extremity ulcers at this point. In fact he really does not have any ulcerations as much  as he just does have lymphedema breakdown causing weeping. Nonetheless he is drying up quite a bit but overall seems to be doing much better. He has a lot of dry flaky skin I think we may want to add triamcinolone to the skin to try to help out with cutting back on some of the irritation here hopefully improve the overall skin quality. He does have Velcro compression wraps once he heals that is good news as well. 07/23/2019 upon evaluation today patient appears to be doing better with regard to his bilateral lower extremities. I think we are getting closer to getting everything sealed up. Again he does not have any significant wounds and his lymphedema has dramatically improved. 07/30/2019 upon evaluation today patient  appears to be doing excellent in regard to his bilateral lower extremities. Fortunately there is no signs of active infection at this time. Overall the patient seems to be in fact completely sealed up as far as any areas of weeping or concern which also has me thrilled based on what we are seeing currently. Overall I think that he is doing extremely well. Electronic Signature(s) Signed: 07/30/2019 4:25:55 PM By: Worthy Keeler PA-C Entered By: Worthy Keeler on 07/30/2019 16:25:54 Steven Meza ([id number]) -------------------------------------------------------------------------------- Physical Exam Details Patient Name: Steven Meza Date of Service: 07/30/2019 1:45 PM Medical Record Number: [id number] Patient Account Number: [id number] Date of Birth/Sex: [birth year] (86 y.o. M) Treating RN: Army Melia Primary Care Provider: Fulton Reek Other Clinician: Referring Provider: Fulton Reek Treating Provider/Extender: Melburn Hake, [place] Genova Weeks in Treatment: 17 Constitutional Obese and well-hydrated in no acute distress. Respiratory normal breathing without difficulty. Psychiatric this patient is able to make decisions and demonstrates good insight into disease process. Alert and Oriented x 3. pleasant and cooperative. Notes Upon evaluation today patient's wounds again all appear to be closed again he had multiple areas of weeping noted which have cleared up at this point there is no signs of blistering and no open ulcerations at this time all of which is great news. Overall I am extremely pleased with how things stand at this time. Electronic Signature(s) Signed: 07/30/2019 4:26:21 PM By: Worthy Keeler PA-C Entered By: Worthy Keeler on 07/30/2019 16:26:21 Steven Meza ([id number]) -------------------------------------------------------------------------------- Physician Orders Details Patient Name: Steven Meza Date of Service: 07/30/2019 1:45 PM Medical Record  Number: [id number] Patient Account Number: [id number] Date of Birth/Sex: Jan 31, 1931 (86 y.o. M) Treating RN: Army Melia Primary Care Provider: Fulton Reek Other Clinician: Referring Provider: Fulton Reek Treating Provider/Extender: Melburn Hake, Lai Hendriks Weeks in Treatment: 37 Verbal / Phone Orders: No Diagnosis Coding ICD-10 Coding Meza Description I89.0 Lymphedema, not elsewhere classified I87.2 Venous insufficiency (chronic) (peripheral) L97.822 Non-pressure chronic ulcer of other part of left lower leg with fat layer exposed I10 Essential (primary) hypertension I25.10 Atherosclerotic heart disease of native coronary artery without angina pectoris L84 Corns and callosities Home Health o D/C [place] - Wound care Discharge From [facility] Services o Discharge from [place] complete Electronic Signature(s) Signed: 07/30/2019 3:08:12 PM By: Army Melia Signed: 08/02/2019 5:29:46 PM By: Worthy Keeler PA-C Previous Signature: 07/30/2019 3:03:40 PM Version By: Army Melia Entered By: Army Melia on 07/30/2019 15:08:11 Steven Meza ([id number]) -------------------------------------------------------------------------------- Problem List Details Patient Name: Steven Meza Date of Service: 07/30/2019 1:45 PM Medical Record Number: [id number] Patient Account Number: [id number] Date of Birth/Sex: 1931-04-28 (86 y.o. M) Treating RN: Army Melia Primary Care Provider: Fulton Reek Other Clinician: Referring Provider: Doy Hutching  Dellis Filbert Treating Provider/Extender: Worthy Keeler Weeks in Treatment: 17 Active Problems ICD-10 Evaluated Encounter Meza Description Active Date Today Diagnosis I89.0 Lymphedema, not elsewhere classified 04/02/2019 No Yes I87.2 Venous insufficiency (chronic) (peripheral) 04/02/2019 No Yes L97.822 Non-pressure chronic ulcer of other part of left lower leg with fat layer 04/02/2019 No Yes exposed I10 Essential (primary)  hypertension 04/02/2019 No Yes I25.10 Atherosclerotic heart disease of native coronary artery without angina 04/02/2019 No Yes pectoris L84 Corns and callosities 05/09/2019 No Yes Inactive Problems Resolved Problems Electronic Signature(s) Signed: 07/30/2019 2:22:34 PM By: Worthy Keeler PA-C Entered By: Worthy Keeler on 07/30/2019 14:22:33 Steven Meza ([id number]) -------------------------------------------------------------------------------- Progress Note Details Patient Name: Steven Meza Date of Service: 07/30/2019 1:45 PM Medical Record Number: [id number] Patient Account Number: [id number] Date of Birth/Sex: December 17, 1930 (86 y.o. M) Treating RN: Army Melia Primary Care Provider: Fulton Reek Other Clinician: Referring Provider: Fulton Reek Treating Provider/Extender: Melburn Hake, Garek Schuneman Weeks in Treatment: 17 Subjective Chief Complaint Information obtained from Patient Left LE Ulcer History of Present Illness (HPI) ADMISSION 05/17/2018 Mr. Slominski is an 86 year old man who is been treated at the lymphedema clinic for a long period with lymphedema wraps for bilateral lower extremity lymphedema. About 6 months ago they noted small open areas on his left lateral calf just above the ankle. These were open. They are apparently putting some form of ointment on this. They have been referred here for our review of this. The patient has a long history of lymphedema and venous stasis. It says in his records that he has recurrent cellulitis although his wife denies this but she states that the lymphedema may have started with cellulitis several years ago. Also in his record there is a history of bladder cancer which they seem to know very little about however he did not have any radiation to the pelvis or anything that could have contributed to lymphedema that they are aware of. There is no prior wound history. The patient has 2 small punched out areas on the left lateral calf  that have adherent debris on the surface. Past medical history; lymphedema, hypertension, cellulitis, hearing loss, morbid obesity, osteoarthritis, venous stasis, bladder CA, diverticulosis. ABIs in our clinic were 0.36 on the right and 0.57 on the left 05/24/2018; corrected age on this patient is 74 versus what I said last week. He has been for his arterial studies which predictably are not very good. On the right his ABI is 0.65. Monophasic waveforms noted at the right ankle including the posterior tibial and dorsalis pedis. He has triphasic waveforms of the common femoral and profunda femoris triphasic proximal SFA with monophasic distal FSA and popliteal artery. Monophasic tibial waveforms. On the left his ABI is 0.58 monophasic waveforms at the left ankle. Duplex of the left lower extremity demonstrated atherosclerotic change with monophasic waveforms throughout the left lower extremity. There was some concern about left iliac disease and potentially femoral-popliteal and tibial disease. The patient does not describe claudication although according to his wife he over emphasizes his activity. Patient states he is limited by pain in both knees His wounds are on the left lateral calf. 2 small areas with necrotic debris. We have been using silver collagen and light Ace wraps 05/31/2018; wounds on the left lateral calf in the setting of very significant lymphedema and probably PAD. We have been using silver collagen. The base of the small wound looks reasonably improved. I sent him to see Dr. Fletcher Anon however I see now he has an  appointment with Dr. Alvester Chou on February 12 2/5; left lateral calf wound looks the same. His wife is doing a good job with her lymphedema wraps and maintaining that the edema. He most likely has PAD and is due to see Dr. Gwenlyn Found of infectious disease on February 12 2/19; left lateral calf wounds look about the same. This looks like wound secondary to chronic venous insufficiency  with lymphedema however the patient has very poor arterial studies. We referred him to Dr. Gwenlyn Found. Dr. Gwenlyn Found did not feel he needed to do anything from an arterial point of view. He did not address the question about the aggressiveness of compression. After some thoughts about this I elected to go ahead and put him in 3 layer compression which after all would be less compression then the lymphedema clinic was putting on this. I am hopeful that this should be enough to get some closure of the small wounds 2/26; left lateral calf wound letter this week. He tolerated the 3 layer compression well furthermore he is sleeping in a hospital bed which helps keep his legs up at night. The wound looks better measuring smaller especially in width 3/3; left lateral calf wound about the same size. The 3 layer compression has really helped get the edema down in his leg however. Using silver collagen 3/11; left lateral calf wound better looking wound surface but about the same size. We have been using 3 layer compression which is really helped get the edema down in the left leg. Even after Dr. Kennon Holter assessment I am reluctant to go to 4 layer compression. He is tolerating 3 layer compression well. We have been using silver collagen 3/18-Patient returns for his left lateral calf wound which overall is looking better, slightly increased in size and dimensions and area, he is tolerating the 3 layer compression, has been dressed with silver collagen which we will continue 3/25 left lateral calf wound much better looking. Size is smaller. He has been using silver collagen 4/1; gradually getting smaller in size. Using silver collagen with 3 layer compression. I think the patient has some degree of PAD. He saw Dr. Gwenlyn Found in consultation, he did not specifically comment on whether he could tolerate 4 layer compression 4/8; the wound is slightly smaller in depth. We are using 3 layer compression with silver collagen. I had  him seen for arterial insufficiency however his ABI was 0.57 in the left leg. Dr. Gwenlyn Found really did not seem to middle on the degree of compression he could tolerate 4/15; the wound is slightly smaller and slightly less deep. We have been using 3 layer compression with silver collagen. There is not an option for 4-layer compression here. 4/22; the patient is making decent progress on his wound on the left lateral calf however he arrives in with a superficial wound on the right anterior tibial area. Were not really sure how this happened. He had been using a stocking on the right leg. He has been using silver collagen on the wounds Steven Meza, Steven Meza ([id number]) Amazing to me he is he appears to have compression pumps at home. I am not really sure I knew this before. In any case he has not been using them 4/29; left lateral calf wound continues to get gradually smaller. This looks like it is on its way to closure. The area on the right anterior tibial area is about the same in terms of size superficial without any depth. This was probably wrap injury He tells me that  he has been using his compression pumps once a day for 1 hour without any pain 5/6; not as good today. Left lateral calf wound is actually larger and he does not have quite as good edema control. The area on the right anterior tibia is about the same still superficial. He states he is using his compression pumps once a day. We are using 3 layer compression on the left and Curlex Coban on the right 5/13; the area on the right is very close to being healed. The area on the left still has some depth but has a healthy wound surface. He has excellent edema control bilaterally. Using Hydrofera Blue 5/20-Patient returns in 1 week after being in 3 layer compression on the left, the left lateral calf ulcer is stable with some slough, the right anterior tibial ulcer is healed we have been using Kerlix Coban on that leg 5/27; Hydrofera Blue and 3  layer compression. Still some slough in the wound area and some nonviable edges around the wound. 6/3; this patient has lymphedema and chronic venous insufficiency. He had difficult to close wound on his left lateral calf. This is closed today. 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 Finally he has known PAD but he is managed to tolerate the compression we reporting on him and also using his compression pumps that he already had at home twice a day 6/10; we discharge this patient last week having finally close the small area in the left lateral calf. Apparently by Friday of last week this had opened and was draining. He is therefore back in clinic. His wife who is present also was concerned about an area on the right anterior tibia."o 6/17; the patient does not have an open area on the right leg. On the left he has a small circular area anteriorly and the same area laterally that he had last time. However both of these appear to be improved he has his Farrow wrap 30/40. It turns out that he is only using his external compression pumps once a day. I tried to get him up to twice a day today 6/24; the patient comes in with his external compression garment on the right leg/Farrow wrap. As far as we know no open wounds here. He has 2 areas that are smaller one on the left anterior pretibial and one on the left lateral calf which is the site of his initial wounds. I have successfully caught him into his external compression pumps twice a day 7/1; still using his external compression garment on the right leg. As far as we know there are no open wounds here. The area on the left lateral calf which was the site of his initial wound has closed over. The area on the left anterior pretibial that we identified last week is still open. 7/8; as far as we know he has no open wounds on the right leg. He has his external compression garment on here. The original wound on the  left lateral calf has closed over. He developed an area on the left anterior pretibial 2 weeks ago and seems to have 2 smaller areas on the proximal and distal lateral calf. We do not have his good edema control this week as I am used to seeing 7/15; the patient has still a linear area on the left anterior tibia. The area on the left lateral leg looked somewhat inflamed today but no cellulitis. He did have what looked to be  a small pustule that I cultured for CandS although I did not give him empiric antibiotics. 7/22; small area on the left anterior tibia. The area on the left lateral leg still looks inflamed. The pustule that I cultured last week grew Enterobacter and Pseudomonas. I prescribed Cipro they still have not picked that up. In any case the area actually looks fairly satisfactory. He has decent edema control using his compression pumps twice a day 7/29; the area on the left lateral leg looks a lot better than last week. Nothing is open here. He has an area just medial to the mid anterior tibia that is still open that is only open wound today. 8/5-Patient returns at 1 week for the left leg ulcer, which is actually looking better, he is also complaining of left plantar foot pain below the great toe. He does have a hematoma here 8/12-Patient returns at 1 week, the left leg open area looks about the same if not slightly better, the left plantar hematoma under the great toe is about the same, patient apparently has not been resting his foot on anything for too long period of time and does not wear the Birkenstock shoes unless he walks outdoors which he does not do often 8/19; small wound on the left anterior lower leg this is just about closed there is nothing on the left lateral leg. He is using his compression pumps He arrives in clinic today with a black eschar over the right first plantar metatarsal head. This was apparently a bruise that was identified by her nurses last week. He wears  crocs on his feet. He also may rub these on a footboard at home 8/26; there is nothing open on the left lateral lower leg he has a small area just medial to the tibia. He is going to see his podiatrist Dr. Milinda Pointer about the area on the left plantar first metatarsal head. He is using his external compression pumps. He has a juxta lite on the right leg 9/2; there is nothing open on either side of her left lower leg. He went to see podiatry about the area on the left first metatarsal head. This was apparently debrided. He was given mupirocin and oral antibiotics. I do not believe he is offloading this at all. Readmission: 04/02/2019 upon evaluation today patient actually appears for reevaluation here in our clinic secondary to issues that he is having with his left lower extremity. He has been tolerating the dressing changes without complication. Fortunately there is no signs of active infection at this time. No fevers, chills, nausea, vomiting, or diarrhea. He has been in the hospital since we last saw him and then subsequently transferred back to [facility]. He tells me that they have been changing his dressing once a day and that he has had a significant amount of pain secondary to undergoing these dressing changes. Fortunately there is no evidence of active infection at this point which is good news. No fevers, chills, nausea, vomiting, or diarrhea. With that being said he wonders if we can go back to doing what we were doing before which was when he actually had the dressing changes performed once a week here in the clinic he tells me that he did very well with that. At that point he tolerated a 3 layer compression wrap which was approved by vascular, Dr. Tyrell Antonio office, to be used previous although 4-layer compression wrap is probably too much for him. 04/16/2019 on evaluation today patient appears to be doing  decently well with regard to his lower extremity although he has had a lot of drainage  from his leg. A lot of this I think is just secondary to the lymphedema with that being said he does have some odor as well which may again just be due to more of the excessive moisture although I think that he may also have something that is a result of bacteria again he has somewhat of a Pseudomonas-like smell to the drainage and fortunately though he is not showing any signs of severe cellulitis he is still experiencing quite a bit of drainage and the discoloration is somewhat of a yellow/green in color which is consistent with Pseudomonas. If he can tolerated I think he may do well with Cipro. 04/24/2019 on evaluation today patient appears to be doing well with regard to his wound in the gluteal region which is healing quite well. That is excellent news. With that being said he is also doing better with regard to his left lower extremity overall I feel like things are definitely showing signs of improvement. Steven Meza, Steven Meza ([id number]) 12/28-Patient returns with regards to his gluteal wound that is improving, Left lower leg wounds with significant edema in the lower legs that appear to be persistent. Patient is in compression and has lymphedema pumps at home but apparently he does not use consistently 05/07/2019 Upon inspection patient's wound bed actually showed signs on evaluation today patient's wound currently showed signs of improvement. There is still a lot of maceration but it does look like that there is been some of this maceration occurring as a result of drainage in particular and some as a result of the fact that they have been putting bag balm on the wound area as well as the leg in general. I think this is applying too much moisture and not doing him any good. I discussed with the patient and his wife today that I think that practice needs to be adjusted I would recommend using a different type of lotion in order to help with moisture around the wound but I would not recommend  anything directly on the wound itself. 05/14/2019 on evaluation today patient actually appears to be doing much better in regard to his lower extremity. They have not been using any of the bag balm on the area I think this has made a tremendous improvement overall in his wounds. He has a lot of new skin growth and though there still are a lot of open wounds scattered throughout the circumferential lower leg this overall seems to be doing much better to me. 05/21/2019 upon evaluation today patient appears to be doing well in regard to his lower extremity although he is having unfortunately some blue-green drainage noted at this point. The does have me concerned for a reinitiation of the Pseudomonas infection that we previously treated in December. The last time I gave him a prescription was April 16, 2019. That was a little over a month ago. Nonetheless he does have more green drainage noted today and I think this is indicative of potentially a revving up infection. Obviously we do not want to get to that point. 05/28/2019 on evaluation today patient appears to be doing better with regard to his lower extremity at this point. Overall I am seeing signs of improvement which is good news. Fortunately there is no evidence of active infection at this time. I do believe that the Cipro has been beneficial for him that was prescribed last week for 14  days he is halfway through that and already appears to be doing much better. This is excellent news. 06/04/2019 upon evaluation today I do believe that the patient is doing better with regard to his lower extremity. I feel like that he has much less in the way of open wounds and drainage and much in provement in regard to the overall weeping and even his swelling. There is no signs of erythema no obvious evidence of active infection all of which is great news. No fevers, chills, nausea, vomiting, or diarrhea. 06/11/2019 on evaluation today patient appears to be doing  better with regard to his lower extremity on the left. He has been tolerating dressing changes without complication. Fortunately there is no signs of active infection at this time. No fevers, chills, nausea, vomiting, or diarrhea. 06/18/2019 upon evaluation today patient appears to be doing slightly worse compared to last week. They did call me on Friday due to the fact that he had green discharge and both his wife as well as his home health nurse were concerned about what they were seeing. Subsequently I did actually send in a prescription for Cipro for 14 days for him and according to his wife this seems to be doing better based on what she is seeing today compared to Friday. This is great news. Home health is still coming out to see him on Wednesdays and Fridays as well. 06/25/2019 upon evaluation today patient appears to be doing somewhat better in regard to his leg ulcer. Fortunately there is no signs of active infection at this time which is good news. With that being said I am very pleased with how things seem to be going and overall I see no major issues here I think the alginate is doing well along with the XtraSorb. With regard to the patient's plantar foot he does have somewhat of a blood blister that appears to be resolving at this point his wife states that she feels like the issue is that he is not dropping his shoe in order to keep it from sliding when he walks. Subsequently this is leading to increases in friction which of course is leading to more issues with getting this area to heal the patient is struggling in that regard. 07/02/2019 upon evaluation today patient appears to be making some progress in regard to his lower extremities. With that being said I really feel like the alginate may be getting too moist and then sitting on the skin causing some complications here for [place]. I discussed with the patient and his wife today as well the possibility of going ahead and discontinuing the  alginate just utilizing the Xtrasorb to try to keep the edema under better control while at the same time preventing any dressings from staying too moist right on the surface of the wounds. Obviously I think this can do a good job in helping to dry things out but I would recommend that we give this a shot. 07/09/2019 upon evaluation today patient appears to be doing better in regard to some of the edema and weeping in regard to the left lower extremity. Unfortunately he has not been wearing his compression of the right lower extremity and now that is open as well. He needs to obviously be wearing this although for today we will get a half to wrap him due to the fact that this is now open. I explained that he does need to be wearing his compression at all times. 07/16/2019 upon evaluation today patient  actually does appear to be making some progress in regard to his lower extremity ulcers at this point. In fact he really does not have any ulcerations as much as he just does have lymphedema breakdown causing weeping. Nonetheless he is drying up quite a bit but overall seems to be doing much better. He has a lot of dry flaky skin I think we may want to add triamcinolone to the skin to try to help out with cutting back on some of the irritation here hopefully improve the overall skin quality. He does have Velcro compression wraps once he heals that is good news as well. 07/23/2019 upon evaluation today patient appears to be doing better with regard to his bilateral lower extremities. I think we are getting closer to getting everything sealed up. Again he does not have any significant wounds and his lymphedema has dramatically improved. 07/30/2019 upon evaluation today patient appears to be doing excellent in regard to his bilateral lower extremities. Fortunately there is no signs of active infection at this time. Overall the patient seems to be in fact completely sealed up as far as any areas of weeping or  concern which also has me thrilled based on what we are seeing currently. Overall I think that he is doing extremely well. Steven Meza, Steven Meza ([id number]) Objective Constitutional Obese and well-hydrated in no acute distress. Vitals Time Taken: 2:00 PM, Height: 65 in, Weight: 273 lbs, BMI: 45.4, Temperature: 97.7 F, Pulse: 65 bpm, Respiratory Rate: 18 breaths/min, Blood Pressure: 124/58 mmHg. Respiratory normal breathing without difficulty. Psychiatric this patient is able to make decisions and demonstrates good insight into disease process. Alert and Oriented x 3. pleasant and cooperative. General Notes: Upon evaluation today patient's wounds again all appear to be closed again he had multiple areas of weeping noted which have cleared up at this point there is no signs of blistering and no open ulcerations at this time all of which is great news. Overall I am extremely pleased with how things stand at this time. Other Condition(s) Patient presents with Lymphedema located on the Left Leg. Patient presents with Lymphedema located on the Right Leg. Assessment Active Problems ICD-10 Lymphedema, not elsewhere classified Venous insufficiency (chronic) (peripheral) Non-pressure chronic ulcer of other part of left lower leg with fat layer exposed Essential (primary) hypertension Atherosclerotic heart disease of native coronary artery without angina pectoris Corns and callosities Plan Home Health: D/C Home Health Services - Wound care Discharge From [facility] Services: Discharge from [place] complete 1. Based on what I am seeing at this point I do not see any signs of active infection currently nor any open wounds which I think is excellent news. My suggestion is good to be that we go ahead and initiate treatment with discontinuing wound care services and subsequently switching over to the patient's Velcro compression wraps. We will go ahead and have him get a second pair  which we can order for him today. 2. He also needs to be using his lymphedema pumps on a regular basis. This means every day twice a day we discussed this in great detail. On top of this he also needs to be elevating his legs on a regular basis. We will see the patient back for follow-up visit as needed. Electronic Signature(s) KHIAN, DEBO ([id number]) Signed: 07/30/2019 4:38:10 PM By: Worthy Keeler PA-C Entered By: Worthy Keeler on 07/30/2019 16:38:09 Steven Meza ([id number]) -------------------------------------------------------------------------------- SuperBill Details Patient Name: Steven Meza Date of Service:  07/30/2019 Medical Record Number: [id number] Patient Account Number: [id number] Date of Birth/Sex: January 21, 1931 (86 y.o. M) Treating RN: Army Melia Primary Care Provider: Fulton Reek Other Clinician: Referring Provider: Fulton Reek Treating Provider/Extender: Melburn Hake, Kamden Reber Weeks in Treatment: 17 Diagnosis Coding ICD-10 Codes Meza Description I89.0 Lymphedema, not elsewhere classified I87.2 Venous insufficiency (chronic) (peripheral) L97.822 Non-pressure chronic ulcer of other part of left lower leg with fat layer exposed I10 Essential (primary) hypertension I25.10 Atherosclerotic heart disease of native coronary artery without angina pectoris L84 Corns and callosities Facility Procedures CPT4 Meza: [id number] Description: 99213 - WOUND CARE VISIT-LEV 3 EST PT Modifier: Quantity: 1 Physician Procedures CPT4 Meza: [id number] Description: 99213 - WC PHYS LEVEL 3 - EST PT Modifier: Quantity: 1 CPT4 Meza: Description: ICD-10 Diagnosis Description I89.0 Lymphedema, not elsewhere classified I87.2 Venous insufficiency (chronic) (peripheral) L97.822 Non-pressure chronic ulcer of other part of left lower leg with fat layer e I10 Essential (primary)  hypertension Modifier: xposed Quantity: Electronic Signature(s) Signed: 07/30/2019 4:44:10 PM By: Worthy Keeler PA-C Entered By: Worthy Keeler on 07/30/2019 16:44:10

## 2019-08-06 ENCOUNTER — Encounter: Payer: Medicare Other | Admitting: Physician Assistant

## 2019-08-13 ENCOUNTER — Encounter: Payer: Medicare Other | Admitting: Physician Assistant

## 2019-08-20 ENCOUNTER — Encounter: Payer: Medicare Other | Admitting: Physician Assistant

## 2019-08-27 ENCOUNTER — Encounter: Payer: Medicare Other | Admitting: Physician Assistant

## 2019-09-03 ENCOUNTER — Other Ambulatory Visit: Payer: Self-pay

## 2019-09-03 ENCOUNTER — Encounter: Payer: Medicare Other | Attending: Physician Assistant | Admitting: Physician Assistant

## 2019-09-03 DIAGNOSIS — L84 Corns and callosities: Secondary | ICD-10-CM | POA: Diagnosis not present

## 2019-09-03 DIAGNOSIS — Z8551 Personal history of malignant neoplasm of bladder: Secondary | ICD-10-CM | POA: Diagnosis not present

## 2019-09-03 DIAGNOSIS — I10 Essential (primary) hypertension: Secondary | ICD-10-CM | POA: Insufficient documentation

## 2019-09-03 DIAGNOSIS — M1990 Unspecified osteoarthritis, unspecified site: Secondary | ICD-10-CM | POA: Diagnosis not present

## 2019-09-03 DIAGNOSIS — M25562 Pain in left knee: Secondary | ICD-10-CM | POA: Diagnosis not present

## 2019-09-03 DIAGNOSIS — Z6841 Body Mass Index (BMI) 40.0 and over, adult: Secondary | ICD-10-CM | POA: Diagnosis not present

## 2019-09-03 DIAGNOSIS — M25561 Pain in right knee: Secondary | ICD-10-CM | POA: Insufficient documentation

## 2019-09-03 DIAGNOSIS — K5790 Diverticulosis of intestine, part unspecified, without perforation or abscess without bleeding: Secondary | ICD-10-CM | POA: Insufficient documentation

## 2019-09-03 DIAGNOSIS — I890 Lymphedema, not elsewhere classified: Secondary | ICD-10-CM | POA: Insufficient documentation

## 2019-09-03 DIAGNOSIS — I2510 Atherosclerotic heart disease of native coronary artery without angina pectoris: Secondary | ICD-10-CM | POA: Diagnosis not present

## 2019-09-03 DIAGNOSIS — L97822 Non-pressure chronic ulcer of other part of left lower leg with fat layer exposed: Secondary | ICD-10-CM | POA: Insufficient documentation

## 2019-09-03 DIAGNOSIS — I872 Venous insufficiency (chronic) (peripheral): Secondary | ICD-10-CM | POA: Insufficient documentation

## 2019-09-03 NOTE — Progress Notes (Signed)
ABDULAHAD, Steven Meza ([id number]) Visit Report for 09/03/2019 Arrival Information Details Patient Name: Steven Meza, Steven Meza Date of Service: 09/03/2019 3:30 PM Medical Record Number: [id number] Patient Account Number: [id number] Date of Birth/Sex: 1931/04/08 (86 y.o. M) Treating RN: Army Melia Primary Care Annelies Coyt: Fulton Reek Other Clinician: Referring Clydene Burack: Fulton Reek Treating Deondrae Mcgrail/Extender: Melburn Hake, HOYT Weeks in Treatment: 43 Visit Information History Since Last Visit Added or deleted any medications: No Patient Arrived: [place] Any new allergies or adverse reactions: No Arrival Time: 15:31 Had a fall or experienced change in No Accompanied By: wife activities of daily living that may affect Transfer Assistance: None risk of falls: Patient Identification Verified: Yes Signs or symptoms of abuse/neglect since last visito No Secondary Verification Process Completed: Yes Hospitalized since last visit: No Implantable device outside of the clinic excluding No cellular tissue based products placed in the center since last visit: Has Dressing in Place as Prescribed: Yes Has Compression in Place as Prescribed: Yes Pain Present Now: No Electronic Signature(s) Signed: 09/03/2019 4:40:04 PM By: Lorine Bears RCP, RRT, CHT Entered By: Lorine Bears on 09/03/2019 15:32:37 Steven Meza ([id number]) -------------------------------------------------------------------------------- Clinic Level of Care Assessment Details Patient Name: Steven Meza Date of Service: 09/03/2019 3:30 PM Medical Record Number: [id number] Patient Account Number: [id number] Date of Birth/Sex: [birth year] (86 y.o. M) Treating RN: Army Melia Primary Care Yomayra Tate: Fulton Reek Other Clinician: Referring Fraidy Mccarrick: Fulton Reek Treating Ade Stmarie/Extender: Melburn Hake, HOYT Weeks in Treatment: 22 Clinic Level of Care Assessment Items TOOL 4 Quantity Score []  - Use  when only an EandM is performed on FOLLOW-UP visit 0 ASSESSMENTS - Nursing Assessment / Reassessment []  - Reassessment of Co-morbidities (includes updates in patient status) 0 []  - 0 Reassessment of Adherence to Treatment Plan ASSESSMENTS - Wound and Skin Assessment / Reassessment []  - Simple Wound Assessment / Reassessment - one wound 0 []  - 0 Complex Wound Assessment / Reassessment - multiple wounds []  - 0 Dermatologic / Skin Assessment (not related to wound area) ASSESSMENTS - Focused Assessment []  - Circumferential Edema Measurements - multi extremities 0 []  - 0 Nutritional Assessment / Counseling / Intervention []  - 0 Lower Extremity Assessment (monofilament, tuning fork, pulses) []  - 0 Peripheral Arterial Disease Assessment (using hand held doppler) ASSESSMENTS - Ostomy and/or Continence Assessment and Care []  - Incontinence Assessment and Management 0 []  - 0 Ostomy Care Assessment and Management (repouching, etc.) PROCESS - Coordination of Care []  - Simple Patient / Family Education for ongoing care 0 []  - 0 Complex (extensive) Patient / Family Education for ongoing care []  - 0 Staff obtains Programmer, systems, Records, Test Results / Process Orders []  - 0 Staff telephones HHA, Nursing Homes / Clarify orders / etc []  - 0 Routine Transfer to another Facility (non-emergent condition) []  - 0 Routine Hospital Admission (non-emergent condition) []  - 0 New Admissions / Biomedical engineer / Ordering NPWT, Apligraf, etc. []  - 0 Emergency Hospital Admission (emergent condition) []  - 0 Simple Discharge Coordination []  - 0 Complex (extensive) Discharge Coordination PROCESS - Special Needs []  - Pediatric / Minor Patient Management 0 []  - 0 Isolation Patient Management []  - 0 Hearing / Language / Visual special needs []  - 0 Assessment of Community assistance (transportation, D/C planning, etc.) []  - 0 Additional assistance / Altered mentation []  - 0 Support Surface(s)  Assessment (bed, cushion, seat, etc.) INTERVENTIONS - Wound Cleansing / Measurement TRANELL, GUTZMER A. ([id number]) []  - 0 Simple Wound Cleansing - one wound []  - 0 Complex Wound Cleansing -  multiple wounds []  - 0 Wound Imaging (photographs - any number of wounds) []  - 0 Wound Tracing (instead of photographs) []  - 0 Simple Wound Measurement - one wound []  - 0 Complex Wound Measurement - multiple wounds INTERVENTIONS - Wound Dressings []  - Small Wound Dressing one or multiple wounds 0 []  - 0 Medium Wound Dressing one or multiple wounds []  - 0 Large Wound Dressing one or multiple wounds []  - 0 Application of Medications - topical []  - 0 Application of Medications - injection INTERVENTIONS - Miscellaneous []  - External ear exam 0 []  - 0 Specimen Collection (cultures, biopsies, blood, body fluids, etc.) []  - 0 Specimen(s) / Culture(s) sent or taken to Lab for analysis []  - 0 Patient Transfer (multiple staff / Civil Service fast streamer / Similar devices) []  - 0 Simple Staple / Suture removal (25 or less) []  - 0 Complex Staple / Suture removal (26 or more) []  - 0 Hypo / Hyperglycemic Management (close monitor of Blood Glucose) []  - 0 Ankle / Brachial Index (ABI) - do not check if billed separately []  - 0 Vital Signs Has the patient been seen at the hospital within the last three years: Yes Total Score: 0 Level Of Care: ____ Electronic Signature(s) Signed: 09/03/2019 4:19:51 PM By: Army Melia Entered By: Army Melia on 09/03/2019 16:16:19 Steven Meza ([id number]) -------------------------------------------------------------------------------- Compression Therapy Details Patient Name: Steven Meza Date of Service: 09/03/2019 3:30 PM Medical Record Number: [id number] Patient Account Number: [id number] Date of Birth/Sex: Sep 25, 1930 (86 y.o. M) Treating RN: Army Melia Primary Care Immanuel Fedak: Fulton Reek Other Clinician: Referring Danzel Marszalek: Fulton Reek Treating  Lazaria Schaben/Extender: Melburn Hake, HOYT Weeks in Treatment: 22 Compression Therapy Performed for Wound Assessment: Wound #10 Left,Distal,Medial Lower Leg Performed By: Clinician Army Melia, RN Compression Type: Three Layer Post Procedure Diagnosis Same as Pre-procedure Electronic Signature(s) Signed: 09/03/2019 4:19:51 PM By: Army Melia Entered By: Army Melia on 09/03/2019 16:14:50 Steven Meza ([id number]) -------------------------------------------------------------------------------- Compression Therapy Details Patient Name: Steven Meza Date of Service: 09/03/2019 3:30 PM Medical Record Number: [id number] Patient Account Number: [id number] Date of Birth/Sex: May 19, 1930 (86 y.o. M) Treating RN: Army Melia Primary Care Yahaira Bruski: Fulton Reek Other Clinician: Referring Mahonri Seiden: Fulton Reek Treating Larenda Reedy/Extender: Melburn Hake, HOYT Weeks in Treatment: 22 Compression Therapy Performed for Wound Assessment: Wound #8 Left,Anterior Lower Leg Performed By: Clinician Army Melia, RN Compression Type: Three Layer Post Procedure Diagnosis Same as Pre-procedure Electronic Signature(s) Signed: 09/03/2019 4:19:51 PM By: Army Melia Entered By: Army Melia on 09/03/2019 16:14:50 Steven Meza ([id number]) -------------------------------------------------------------------------------- Compression Therapy Details Patient Name: Steven Meza Date of Service: 09/03/2019 3:30 PM Medical Record Number: [id number] Patient Account Number: [id number] Date of Birth/Sex: 1930-10-17 (86 y.o. M) Treating RN: Army Melia Primary Care Adria Costley: Fulton Reek Other Clinician: Referring Shauntelle Jamerson: Fulton Reek Treating Amiere Cawley/Extender: Melburn Hake, HOYT Weeks in Treatment: 22 Compression Therapy Performed for Wound Assessment: Wound #9 Left,Proximal,Medial Lower Leg Performed By: Clinician Army Melia, RN Compression Type: Three Layer Post Procedure Diagnosis Same as  Pre-procedure Electronic Signature(s) Signed: 09/03/2019 4:19:51 PM By: Army Melia Entered By: Army Melia on 09/03/2019 16:14:51 Steven Meza ([id number]) -------------------------------------------------------------------------------- Encounter Discharge Information Details Patient Name: Steven Meza Date of Service: 09/03/2019 3:30 PM Medical Record Number: [id number] Patient Account Number: [id number] Date of Birth/Sex: 07/17/1930 (86 y.o. M) Treating RN: Army Melia Primary Care Vickye Astorino: Fulton Reek Other Clinician: Referring Callen Vancuren: Fulton Reek Treating Coyle Stordahl/Extender: Melburn Hake, HOYT Weeks in Treatment: 22 Encounter Discharge Information Items Discharge Condition: Stable Ambulatory Status: [name]  Discharge Destination: Home Transportation: Private Auto Accompanied By: wife Schedule Follow-up Appointment: Yes Clinical Summary of Care: Electronic Signature(s) Signed: 09/03/2019 4:19:51 PM By: Army Melia Entered By: Army Melia on 09/03/2019 16:17:11 Steven Meza ([id number]) -------------------------------------------------------------------------------- Lower Extremity Assessment Details Patient Name: Steven Meza Date of Service: 09/03/2019 3:30 PM Medical Record Number: [id number] Patient Account Number: [id number] Date of Birth/Sex: Oct 23, 1930 (86 y.o. M) Treating RN: Montey Hora Primary Care Chay Mazzoni: Fulton Reek Other Clinician: Referring Naaman Curro: Fulton Reek Treating Quindon Denker/Extender: Melburn Hake, HOYT Weeks in Treatment: 22 Edema Assessment Assessed: [Left: No] [Right: No] Edema: [Left: Yes] [Right: Yes] Calf Left: Right: Point of Measurement: 33 cm From Medial Instep 43 cm 40.5 cm Ankle Left: Right: Point of Measurement: 12 cm From Medial Instep 27.5 cm 26.5 cm Vascular Assessment Pulses: Dorsalis Pedis Palpable: [Left:Yes] [Right:Yes] Electronic Signature(s) Signed: 09/03/2019 4:26:56 PM By: Montey Hora Entered  By: Montey Hora on 09/03/2019 15:56:22 Steven Meza ([id number]) -------------------------------------------------------------------------------- Multi Wound Chart Details Patient Name: Steven Meza Date of Service: 09/03/2019 3:30 PM Medical Record Number: [id number] Patient Account Number: [id number] Date of Birth/Sex: Apr 25, 1931 (86 y.o. M) Treating RN: Army Melia Primary Care Delvin Hedeen: Fulton Reek Other Clinician: Referring Lilou Kneip: Fulton Reek Treating Gerda Yin/Extender: Melburn Hake, HOYT Weeks in Treatment: 22 Vital Signs Height(in): 65 Pulse(bpm): 64 Weight(lbs): 62 Blood Pressure(mmHg): 139/51 Body Mass Index(BMI): 45 Temperature(F): 97.9 Respiratory Rate(breaths/min): 18 Photos: Wound Location: Left, Distal, Medial Lower Leg Left, Anterior Lower Leg Left, Proximal, Medial Lower Leg Wounding Event: Gradually Appeared Gradually Appeared Gradually Appeared Primary Etiology: Lymphedema Lymphedema Lymphedema Comorbid History: Lymphedema, Coronary Artery Lymphedema, Coronary Artery Lymphedema, Coronary Artery Disease, Hypertension Disease, Hypertension Disease, Hypertension Date Acquired: 08/02/2019 08/02/2019 08/02/2019 Weeks of Treatment: 0 0 0 Wound Status: Open Open Open Measurements L x W x D (cm) 0.6x0.8x0.1 0.8x0.6x0.1 1x0.9x0.1 Area (cm) : 0.377 0.377 0.707 Volume (cm) : 0.038 0.038 0.071 Classification: Full Thickness Without Exposed Full Thickness Without Exposed Full Thickness Without Exposed Support Structures Support Structures Support Structures Exudate Amount: Medium Medium Medium Exudate Type: Serous Serous Serous Exudate Color: amber amber amber Wound Margin: Flat and Intact Flat and Intact Flat and Intact Granulation Amount: Large (67-100%) Large (67-100%) Large (67-100%) Granulation Quality: Pink Pink Pink Necrotic Amount: Small (1-33%) None Present (0%) Small (1-33%) Exposed Structures: Fat Layer (Subcutaneous Tissue) Fat Layer  (Subcutaneous Tissue) Fat Layer (Subcutaneous Tissue) Exposed: Yes Exposed: Yes Exposed: Yes Fascia: No Fascia: No Fascia: No Tendon: No Tendon: No Tendon: No Muscle: No Muscle: No Muscle: No Joint: No Joint: No Joint: No Bone: No Bone: No Bone: No Epithelialization: None None None Treatment Notes Electronic Signature(s) Signed: 09/03/2019 4:19:51 PM By: Army Melia Entered By: Army Melia on 09/03/2019 16:10:43 Steven Meza ([id number]) -------------------------------------------------------------------------------- Multi-Disciplinary Care Plan Details Patient Name: Steven Meza Date of Service: 09/03/2019 3:30 PM Medical Record Number: [id number] Patient Account Number: [id number] Date of Birth/Sex: 1930-12-05 (86 y.o. M) Treating RN: Army Melia Primary Care Lyriq Jarchow: Fulton Reek Other Clinician: Referring Braydin Aloi: Fulton Reek Treating Denetta Fei/Extender: Melburn Hake, HOYT Weeks in Treatment: 39 Active Inactive Abuse / Safety / Falls / Self Care Management Nursing Diagnoses: Potential for falls Goals: Patient will remain injury free related to falls Date Initiated: 04/24/2019 Target Resolution Date: 07/14/2019 Goal Status: Active Interventions: Assess fall risk on admission and as needed Notes: Venous Leg Ulcer Nursing Diagnoses: Actual venous Insuffiency (use after diagnosis is confirmed) Goals: Patient will maintain optimal edema control Date Initiated: 04/24/2019 Target Resolution Date: 07/14/2019 Goal Status: Active Interventions: Compression as  ordered Notes: Electronic Signature(s) Signed: 09/03/2019 4:19:51 PM By: Army Melia Entered By: Army Melia on 09/03/2019 16:10:34 Steven Meza ([id number]) -------------------------------------------------------------------------------- Pain Assessment Details Patient Name: Steven Meza Date of Service: 09/03/2019 3:30 PM Medical Record Number: [id number] Patient Account Number:  [id number] Date of Birth/Sex: March 19, 1931 (86 y.o. M) Treating RN: Montey Hora Primary Care Tejuan Gholson: Fulton Reek Other Clinician: Referring Cate Oravec: Fulton Reek Treating Meghna Hagmann/Extender: Melburn Hake, HOYT Weeks in Treatment: 22 Active Problems Location of Pain Severity and Description of Pain Patient Has Paino No Site Locations Pain Management and Medication Current Pain Management: Electronic Signature(s) Signed: 09/03/2019 4:26:56 PM By: Montey Hora Entered By: Montey Hora on 09/03/2019 15:51:53 Steven Meza ([id number]) -------------------------------------------------------------------------------- Patient/Caregiver Education Details Patient Name: Steven Meza Date of Service: 09/03/2019 3:30 PM Medical Record Number: [id number] Patient Account Number: [id number] Date of Birth/Gender: Sep 01, 1930 (86 y.o. M) Treating RN: Army Melia Primary Care Physician: Fulton Reek Other Clinician: Referring Physician: Fulton Reek Treating Physician/Extender: Sharalyn Ink in Treatment: 22 Education Assessment Education Provided To: Patient Education Topics Provided Wound/Skin Impairment: Handouts: Caring for Your Ulcer Methods: Demonstration, Explain/Verbal Responses: State content correctly Electronic Signature(s) Signed: 09/03/2019 4:19:51 PM By: Army Melia Entered By: Army Melia on 09/03/2019 16:13:46 Steven Meza ([id number]) -------------------------------------------------------------------------------- Wound Assessment Details Patient Name: Steven Meza Date of Service: 09/03/2019 3:30 PM Medical Record Number: [id number] Patient Account Number: [id number] Date of Birth/Sex: Jan 26, 1931 (86 y.o. M) Treating RN: Montey Hora Primary Care Noboru Bidinger: Fulton Reek Other Clinician: Referring Jozee Hammer: Fulton Reek Treating Cabela Pacifico/Extender: Melburn Hake, HOYT Weeks in Treatment: 22 Wound Status Wound Number: 10 Primary Etiology:  Lymphedema Wound Location: Left, Distal, Medial Lower Leg Wound Status: Open Wounding Event: Gradually Appeared Comorbid Lymphedema, Coronary Artery Disease, History: Hypertension Date Acquired: 08/02/2019 Weeks Of Treatment: 0 Clustered Wound: No Photos Wound Measurements Length: (cm) 0.6 Width: (cm) 0.8 Depth: (cm) 0.1 Area: (cm) 0.377 Volume: (cm) 0.038 % Reduction in Area: % Reduction in Volume: Epithelialization: None Tunneling: No Undermining: No Wound Description Classification: Full Thickness Without Exposed Support Struc Wound Margin: Flat and Intact Exudate Amount: Medium Exudate Type: Serous Exudate Color: amber tures Foul Odor After Cleansing: No Slough/Fibrino Yes Wound Bed Granulation Amount: Large (67-100%) Exposed Structure Granulation Quality: Pink Fascia Exposed: No Necrotic Amount: Small (1-33%) Fat Layer (Subcutaneous Tissue) Exposed: Yes Necrotic Quality: Adherent Slough Tendon Exposed: No Muscle Exposed: No Joint Exposed: No Bone Exposed: No Treatment Notes Wound #10 (Left, Distal, Medial Lower Leg) Notes scell, ABD, 3 layer left Electronic Signature(s) Signed: 09/03/2019 4:26:56 PM By: Camillia Herter ([id number]) Entered By: Montey Hora on 09/03/2019 16:02:14 Steven Meza ([id number]) -------------------------------------------------------------------------------- Wound Assessment Details Patient Name: Steven Meza Date of Service: 09/03/2019 3:30 PM Medical Record Number: [id number] Patient Account Number: [id number] Date of Birth/Sex: 1930-09-13 (86 y.o. M) Treating RN: Montey Hora Primary Care Destine Ambroise: Fulton Reek Other Clinician: Referring Chela Sutphen: Fulton Reek Treating Zalia Hautala/Extender: Melburn Hake, HOYT Weeks in Treatment: 22 Wound Status Wound Number: 8 Primary Etiology: Lymphedema Wound Location: Left, Anterior Lower Leg Wound Status: Open Wounding Event: Gradually Appeared Comorbid  Lymphedema, Coronary Artery Disease, History: Hypertension Date Acquired: 08/02/2019 Weeks Of Treatment: 0 Clustered Wound: No Photos Wound Measurements Length: (cm) 0.8 Width: (cm) 0.6 Depth: (cm) 0.1 Area: (cm) 0.377 Volume: (cm) 0.038 % Reduction in Area: % Reduction in Volume: Epithelialization: None Tunneling: No Undermining: No Wound Description Classification: Full Thickness Without Exposed Support Struc Wound Margin: Flat and Intact Exudate Amount: Medium Exudate Type: Serous Exudate Color:  amber tures Foul Odor After Cleansing: No Slough/Fibrino No Wound Bed Granulation Amount: Large (67-100%) Exposed Structure Granulation Quality: Pink Fascia Exposed: No Necrotic Amount: None Present (0%) Fat Layer (Subcutaneous Tissue) Exposed: Yes Tendon Exposed: No Muscle Exposed: No Joint Exposed: No Bone Exposed: No Treatment Notes Wound #8 (Left, Anterior Lower Leg) Notes scell, ABD, 3 layer left Electronic Signature(s) Signed: 09/03/2019 4:26:56 PM By: Camillia Herter ([id number]) Entered By: Montey Hora on 09/03/2019 15:59:38 Steven Meza ([id number]) -------------------------------------------------------------------------------- Wound Assessment Details Patient Name: Steven Meza Date of Service: 09/03/2019 3:30 PM Medical Record Number: [id number] Patient Account Number: [id number] Date of Birth/Sex: [birth year] (86 y.o. M) Treating RN: Montey Hora Primary Care Darene Nappi: Fulton Reek Other Clinician: Referring Deklynn Charlet: Fulton Reek Treating Pamella Samons/Extender: Melburn Hake, HOYT Weeks in Treatment: 22 Wound Status Wound Number: 9 Primary Etiology: Lymphedema Wound Location: Left, Proximal, Medial Lower Leg Wound Status: Open Wounding Event: Gradually Appeared Comorbid Lymphedema, Coronary Artery Disease, History: Hypertension Date Acquired: 08/02/2019 Weeks Of Treatment: 0 Clustered Wound: No Photos Wound  Measurements Length: (cm) 1 Width: (cm) 0.9 Depth: (cm) 0.1 Area: (cm) 0.707 Volume: (cm) 0.071 % Reduction in Area: % Reduction in Volume: Epithelialization: None Tunneling: No Undermining: No Wound Description Classification: Full Thickness Without Exposed Support Struc Wound Margin: Flat and Intact Exudate Amount: Medium Exudate Type: Serous Exudate Color: amber tures Foul Odor After Cleansing: No Slough/Fibrino Yes Wound Bed Granulation Amount: Large (67-100%) Exposed Structure Granulation Quality: Pink Fascia Exposed: No Necrotic Amount: Small (1-33%) Fat Layer (Subcutaneous Tissue) Exposed: Yes Necrotic Quality: Adherent Slough Tendon Exposed: No Muscle Exposed: No Joint Exposed: No Bone Exposed: No Treatment Notes Wound #9 (Left, Proximal, Medial Lower Leg) Notes scell, ABD, 3 layer left Electronic Signature(s) Signed: 09/03/2019 4:26:56 PM By: Camillia Herter ([id number]) Entered By: Montey Hora on 09/03/2019 16:01:00 Steven Meza ([id number]) -------------------------------------------------------------------------------- Vitals Details Patient Name: Steven Meza Date of Service: 09/03/2019 3:30 PM Medical Record Number: [id number] Patient Account Number: [id number] Date of Birth/Sex: 11-02-1930 (86 y.o. M) Treating RN: Army Melia Primary Care Sherley Leser: Fulton Reek Other Clinician: Referring Montserrat Shek: Fulton Reek Treating Ladeja Pelham/Extender: Melburn Hake, HOYT Weeks in Treatment: 22 Vital Signs Time Taken: 15:30 Temperature (F): 97.9 Height (in): 65 Pulse (bpm): 73 Weight (lbs): 273 Respiratory Rate (breaths/min): 18 Body Mass Index (BMI): 45.4 Blood Pressure (mmHg): 139/51 Reference Range: 80 - 120 mg / dl Electronic Signature(s) Signed: 09/03/2019 4:40:04 PM By: Lorine Bears RCP, RRT, CHT Entered By: Lorine Bears on 09/03/2019 15:35:26

## 2019-09-03 NOTE — Progress Notes (Addendum)
Steven Meza, Steven Meza ([id number]) Visit Report for 09/03/2019 Chief Complaint Document Details Patient Name: Steven Meza Date of Service: 09/03/2019 3:30 PM Medical Record Number: [id number] Patient Account Number: [id number] Date of Birth/Sex: 1931/01/20 (86 y.o. M) Treating RN: Army Melia Primary Care Provider: Fulton Reek Other Clinician: Referring Provider: Fulton Reek Treating Provider/Extender: Melburn Hake, Thekla Colborn Weeks in Treatment: 22 Information Obtained from: Patient Chief Complaint Left LE Ulcer Electronic Signature(s) Signed: 09/03/2019 4:38:47 PM By: Worthy Keeler PA-C Entered By: Worthy Keeler on 09/03/2019 16:38:45 Steven Meza ([id number]) -------------------------------------------------------------------------------- HPI Details Patient Name: Steven Meza Date of Service: 09/03/2019 3:30 PM Medical Record Number: [id number] Patient Account Number: [id number] Date of Birth/Sex: Jul 26, 1930 (86 y.o. M) Treating RN: Army Melia Primary Care Provider: Fulton Reek Other Clinician: Referring Provider: Fulton Reek Treating Provider/Extender: Melburn Hake, Jaisean Monteforte Weeks in Treatment: 22 History of Present Illness HPI Description: ADMISSION 05/17/2018 [name] is an 86 year old man who is been treated at the lymphedema clinic for a long period with lymphedema wraps for bilateral lower extremity lymphedema. About 6 months ago they noted small open areas on his left lateral calf just above the ankle. These were open. They are apparently putting some form of ointment on this. They have been referred here for our review of this. The patient has a long history of lymphedema and venous stasis. It says in his records that he has recurrent cellulitis although his wife denies this but she states that the lymphedema may have started with cellulitis several years ago. Also in his record there is a history of bladder cancer which they seem to know very little about  however he did not have any radiation to the pelvis or anything that could have contributed to lymphedema that they are aware of. There is no prior wound history. The patient has 2 small punched out areas on the left lateral calf that have adherent debris on the surface. Past medical history; lymphedema, hypertension, cellulitis, hearing loss, morbid obesity, osteoarthritis, venous stasis, bladder CA, diverticulosis. ABIs in our clinic were 0.36 on the right and 0.57 on the left 05/24/2018; corrected age on this patient is 1 versus what I said last week. He has been for his arterial studies which predictably are not very good. On the right his ABI is 0.65. Monophasic waveforms noted at the right ankle including the posterior tibial and dorsalis pedis. He has triphasic waveforms of the common femoral and profunda femoris triphasic proximal SFA with monophasic distal FSA and popliteal artery. Monophasic tibial waveforms. On the left his ABI is 0.58 monophasic waveforms at the left ankle. Duplex of the left lower extremity demonstrated atherosclerotic change with monophasic waveforms throughout the left lower extremity. There was some concern about left iliac disease and potentially femoral-popliteal and tibial disease. The patient does not describe claudication although according to his wife he over emphasizes his activity. Patient states he is limited by pain in both knees His wounds are on the left lateral calf. 2 small areas with necrotic debris. We have been using silver collagen and light Ace wraps 05/31/2018; wounds on the left lateral calf in the setting of very significant lymphedema and probably PAD. We have been using silver collagen. The base of the small wound looks reasonably improved. I sent him to see Dr. Fletcher Anon however I see now he has an appointment with Dr. Alvester Chou on February 12 2/5; left lateral calf wound looks the same. His wife is doing a good job with her lymphedema wraps and  maintaining that the edema. He most likely has PAD and is due to see Dr. Gwenlyn Found of infectious disease on February 12 2/19; left lateral calf wounds look about the same. This looks like wound secondary to chronic venous insufficiency with lymphedema however the patient has very poor arterial studies. We referred him to Dr. Gwenlyn Found. Dr. Gwenlyn Found did not feel he needed to do anything from an arterial point of view. He did not address the question about the aggressiveness of compression. After some thoughts about this I elected to go ahead and put him in 3 layer compression which after all would be less compression then the lymphedema clinic was putting on this. I am hopeful that this should be enough to get some closure of the small wounds 2/26; left lateral calf wound letter this week. He tolerated the 3 layer compression well furthermore he is sleeping in a hospital bed which helps keep his legs up at night. The wound looks better measuring smaller especially in width 3/3; left lateral calf wound about the same size. The 3 layer compression has really helped get the edema down in his leg however. Using silver collagen 3/11; left lateral calf wound better looking wound surface but about the same size. We have been using 3 layer compression which is really helped get the edema down in the left leg. Even after Dr. Kennon Holter assessment I am reluctant to go to 4 layer compression. He is tolerating 3 layer compression well. We have been using silver collagen 3/18-Patient returns for his left lateral calf wound which overall is looking better, slightly increased in size and dimensions and area, he is tolerating the 3 layer compression, has been dressed with silver collagen which we will continue 3/25oleft lateral calf wound much better looking. Size is smaller. He has been using silver collagen 4/1; gradually getting smaller in size. Using silver collagen with 3 layer compression. I think the patient has some  degree of PAD. He saw Dr. Gwenlyn Found in consultation, he did not specifically comment on whether he could tolerate 4 layer compression 4/8; the wound is slightly smaller in depth. We are using 3 layer compression with silver collagen. I had him seen for arterial insufficiency however his ABI was 0.57 in the left leg. Dr. Gwenlyn Found really did not seem to middle on the degree of compression he could tolerate 4/15; the wound is slightly smaller and slightly less deep. We have been using 3 layer compression with silver collagen. There is not an option for 4-layer compression here. 4/22; the patient is making decent progress on his wound on the left lateral calf however he arrives in with a superficial wound on the right anterior tibial area. Were not really sure how this happened. He had been using a stocking on the right leg. He has been using silver collagen on the wounds Amazing to me he is he appears to have compression pumps at home. I am not really sure I knew this before. In any case he has not been using them 4/29; left lateral calf wound continues to get gradually smaller. This looks like it is on its way to closure. oThe area on the right anterior tibial area is about the same in terms of size superficial without any depth. This was probably wrap injury oHe tells me that he has been using his compression pumps once a day for 1 hour without any pain 5/6; not as good today. Left lateral calf wound is actually larger and he does not have  quite as good edema control. The area on the right anterior tibia is about the same still superficial. He states he is using his compression pumps once a day. We are using 3 layer compression on the left and Curlex Coban on the right 5/13; the area on the right is very close to being healed. The area on the left still has some depth but has a healthy wound surface. He has excellent edema control bilaterally. Using [facility] KOBE, BISBEE  ([id number]) 5/20-Patient returns in 1 week after being in 3 layer compression on the left, the left lateral calf ulcer is stable with some slough, the right anterior tibial ulcer is healed we have been using Kerlix Coban on that leg 5/27; Hydrofera Blue and 3 layer compression. Still some slough in the wound area and some nonviable edges around the wound. 6/3; this patient has lymphedema and chronic venous insufficiency. He had difficult to close wound on his left lateral calf. This is closed today. 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 Finally he has known PAD but he is managed to tolerate the compression we reporting on him and also using his compression pumps that he already had at home twice a day 6/10; we discharge this patient last week having finally close the small area in the left lateral calf. Apparently by Friday of last week this had opened and was draining. He is therefore back in clinic. His wife who is present also was concerned about an area on the right anterior tibia."o 6/17; the patient does not have an open area on the right leg. On the left he has a small circular area anteriorly and the same area laterally that he had last time. However both of these appear to be improved he has his Farrow wrap 30/40. It turns out that he is only using his external compression pumps once a day. I tried to get him up to twice a day today 6/24; the patient comes in with his external compression garment on the right leg/Farrow wrap. As far as we know no open wounds here. He has 2 areas that are smaller one on the left anterior pretibial and one on the left lateral calf which is the site of his initial wounds. I have successfully caught him into his external compression pumps twice a day 7/1; still using his external compression garment on the right leg. As far as we know there are no open wounds here. The area on the left lateral calf which was the site of  his initial wound has closed over. The area on the left anterior pretibial that we identified last week is still open. 7/8; as far as we know he has no open wounds on the right leg. He has his external compression garment on here. The original wound on the left lateral calf has closed over. He developed an area on the left anterior pretibial 2 weeks ago and seems to have 2 smaller areas on the proximal and distal lateral calf. We do not have his good edema control this week as I am used to seeing 7/15; the patient has still a linear area on the left anterior tibia. The area on the left lateral leg looked somewhat inflamed today but no cellulitis. He did have what looked to be a small pustule that I cultured for CandS although I did not give him empiric antibiotics. 7/22; small area on the left anterior tibia. The area on the left  lateral leg still looks inflamed. The pustule that I cultured last week grew Enterobacter and Pseudomonas. I prescribed Cipro they still have not picked that up. In any case the area actually looks fairly satisfactory. He has decent edema control using his compression pumps twice a day 7/29; the area on the left lateral leg looks a lot better than last week. Nothing is open here. He has an area just medial to the mid anterior tibia that is still open that is only open wound today. 8/5-Patient returns at 1 week for the left leg ulcer, which is actually looking better, he is also complaining of left plantar foot pain below the great toe. He does have a hematoma here 8/12-Patient returns at 1 week, the left leg open area looks about the same if not slightly better, the left plantar hematoma under the great toe is about the same, patient apparently has not been resting his foot on anything for too long period of time and does not wear the Birkenstock shoes unless he walks outdoors which he does not do often 8/19; small wound on the left anterior lower leg this is just about  closed there is nothing on the left lateral leg. He is using his compression pumps He arrives in clinic today with a black eschar over the right first plantar metatarsal head. This was apparently a bruise that was identified by her nurses last week. He wears crocs on his feet. He also may rub these on a footboard at home 8/26; there is nothing open on the left lateral lower leg he has a small area just medial to the tibia. He is going to see his podiatrist Dr. Milinda Pointer about the area on the left plantar first metatarsal head. He is using his external compression pumps. He has a juxta lite on the right leg 9/2; there is nothing open on either side of her left lower leg. He went to see podiatry about the area on the left first metatarsal head. This was apparently debrided. He was given mupirocin and oral antibiotics. I do not believe he is offloading this at all. Readmission: 04/02/2019 upon evaluation today patient actually appears for reevaluation here in our clinic secondary to issues that he is having with his left lower extremity. He has been tolerating the dressing changes without complication. Fortunately there is no signs of active infection at this time. No fevers, chills, nausea, vomiting, or diarrhea. He has been in the hospital since we last saw him and then subsequently transferred back to [facility]. He tells me that they have been changing his dressing once a day and that he has had a significant amount of pain secondary to undergoing these dressing changes. Fortunately there is no evidence of active infection at this point which is good news. No fevers, chills, nausea, vomiting, or diarrhea. With that being said he wonders if we can go back to doing what we were doing before which was when he actually had the dressing changes performed once a week here in the clinic he tells me that he did very well with that. At that point he tolerated a 3 layer compression wrap which was approved by  vascular, Dr. Tyrell Antonio office, to be used previous although 4-layer compression wrap is probably too much for him. 04/16/2019 on evaluation today patient appears to be doing decently well with regard to his lower extremity although he has had a lot of drainage from his leg. A lot of this I think is just secondary  to the lymphedema with that being said he does have some odor as well which may again just be due to more of the excessive moisture although I think that he may also have something that is a result of bacteria again he has somewhat of a Pseudomonas-like smell to the drainage and fortunately though he is not showing any signs of severe cellulitis he is still experiencing quite a bit of drainage and the discoloration is somewhat of a yellow/green in color which is consistent with Pseudomonas. If he can tolerated I think he may do well with Cipro. 04/24/2019 on evaluation today patient appears to be doing well with regard to his wound in the gluteal region which is healing quite well. That is excellent news. With that being said he is also doing better with regard to his left lower extremity overall I feel like things are definitely showing signs of improvement. 12/28-Patient returns with regards to his gluteal wound that is improving, Left lower leg wounds with significant edema in the lower legs that appear to be persistent. Patient is in compression and has lymphedema pumps at home but apparently he does not use consistently 05/07/2019 Upon inspection patient's wound bed actually showed signs on evaluation today patient's wound currently showed signs of improvement. There is still a lot of maceration but it does look like that there is been some of this maceration occurring as a result of drainage in particular and some as a result of the fact that they have been putting bag balm on the wound area as well as the leg in general. I think this is applying too much moisture and not doing him any  good. I discussed with the patient and his wife today that I think that practice needs to be adjusted I would recommend using a different type of lotion in order to help with moisture around the wound but I would not recommend anything directly on the wound itself. 05/14/2019 on evaluation today patient actually appears to be doing much better in regard to his lower extremity. They have not been using any of the bag balm on the area I think this has made a tremendous improvement overall in his wounds. He has a lot of new skin growth and though there still are a lot of open wounds scattered throughout the circumferential lower leg this overall seems to be doing much better to me. 05/21/2019 upon evaluation today patient appears to be doing well in regard to his lower extremity although he is having unfortunately some blue- green drainage noted at this point. The does have me concerned for a reinitiation of the Pseudomonas infection that we previously treated in Steven Meza, Steven Meza ([id number]) December. The last time I gave him a prescription was April 16, 2019. That was a little over a month ago. Nonetheless he does have more green drainage noted today and I think this is indicative of potentially a revving up infection. Obviously we do not want to get to that point. 05/28/2019 on evaluation today patient appears to be doing better with regard to his lower extremity at this point. Overall I am seeing signs of improvement which is good news. Fortunately there is no evidence of active infection at this time. I do believe that the Cipro has been beneficial for him that was prescribed last week for 14 days he is halfway through that and already appears to be doing much better. This is excellent news. 06/04/2019 upon evaluation today I do believe that the patient  is doing better with regard to his lower extremity. I feel like that he has much less in the way of open wounds and drainage and much in provement in  regard to the overall weeping and even his swelling. There is no signs of erythema no obvious evidence of active infection all of which is great news. No fevers, chills, nausea, vomiting, or diarrhea. 06/11/2019 on evaluation today patient appears to be doing better with regard to his lower extremity on the left. He has been tolerating dressing changes without complication. Fortunately there is no signs of active infection at this time. No fevers, chills, nausea, vomiting, or diarrhea. 06/18/2019 upon evaluation today patient appears to be doing slightly worse compared to last week. They did call me on Friday due to the fact that he had green discharge and both his wife as well as his home health nurse were concerned about what they were seeing. Subsequently I did actually send in a prescription for Cipro for 14 days for him and according to his wife this seems to be doing better based on what she is seeing today compared to Friday. This is great news. Home health is still coming out to see him on Wednesdays and Fridays as well. 06/25/2019 upon evaluation today patient appears to be doing somewhat better in regard to his leg ulcer. Fortunately there is no signs of active infection at this time which is good news. With that being said I am very pleased with how things seem to be going and overall I see no major issues here I think the alginate is doing well along with the XtraSorb. With regard to the patient's plantar foot he does have somewhat of a blood blister that appears to be resolving at this point his wife states that she feels like the issue is that he is not dropping his shoe in order to keep it from sliding when he walks. Subsequently this is leading to increases in friction which of course is leading to more issues with getting this area to heal the patient is struggling in that regard. 07/02/2019 upon evaluation today patient appears to be making some progress in regard to his lower extremities.  With that being said I really feel like the alginate may be getting too moist and then sitting on the skin causing some complications here for [place]. I discussed with the patient and his wife today as well the possibility of going ahead and discontinuing the alginate just utilizing the Xtrasorb to try to keep the edema under better control while at the same time preventing any dressings from staying too moist right on the surface of the wounds. Obviously I think this can do a good job in helping to dry things out but I would recommend that we give this a shot. 07/09/2019 upon evaluation today patient appears to be doing better in regard to some of the edema and weeping in regard to the left lower extremity. Unfortunately he has not been wearing his compression of the right lower extremity and now that is open as well. He needs to obviously be wearing this although for today we will get a half to wrap him due to the fact that this is now open. I explained that he does need to be wearing his compression at all times. 07/16/2019 upon evaluation today patient actually does appear to be making some progress in regard to his lower extremity ulcers at this point. In fact he really does not have any ulcerations as  much as he just does have lymphedema breakdown causing weeping. Nonetheless he is drying up quite a bit but overall seems to be doing much better. He has a lot of dry flaky skin I think we may want to add triamcinolone to the skin to try to help out with cutting back on some of the irritation here hopefully improve the overall skin quality. He does have Velcro compression wraps once he heals that is good news as well. 07/23/2019 upon evaluation today patient appears to be doing better with regard to his bilateral lower extremities. I think we are getting closer to getting everything sealed up. Again he does not have any significant wounds and his lymphedema has dramatically improved. 07/30/2019 upon  evaluation today patient appears to be doing excellent in regard to his bilateral lower extremities. Fortunately there is no signs of active infection at this time. Overall the patient seems to be in fact completely sealed up as far as any areas of weeping or concern which also has me thrilled based on what we are seeing currently. Overall I think that he is doing extremely well. 09/03/2019 upon evaluation today patient appears to be doing somewhat poorly in regard to his left lower extremity with regard to open wounds. Fortunately there is nothing that appears to be draining too terribly although he has about 3 different areas that are open as far as wounds are concerned all 3 seem to be fairly superficial. He was discharged from our clinic on 07/30/2019. Subsequently he reopened in the interim apparently at home health never completely discharged home from being seen. They in fact have still continue to wrap his left leg up until now. Electronic Signature(s) Signed: 09/03/2019 4:44:02 PM By: Worthy Keeler PA-C Entered By: Worthy Keeler on 09/03/2019 16:44:02 Steven Meza ([id number]) -------------------------------------------------------------------------------- Physical Exam Details Patient Name: Steven Meza Date of Service: 09/03/2019 3:30 PM Medical Record Number: [id number] Patient Account Number: [id number] Date of Birth/Sex: [birth year] (86 y.o. M) Treating RN: Army Melia Primary Care Provider: Fulton Reek Other Clinician: Referring Provider: Fulton Reek Treating Provider/Extender: Melburn Hake, Lashunda Greis Weeks in Treatment: 22 Constitutional Obese and well-hydrated in no acute distress. Respiratory normal breathing without difficulty. Psychiatric this patient is able to make decisions and demonstrates good insight into disease process. Alert and Oriented x 3. pleasant and cooperative. Notes Upon inspection patient's wound bed actually showed signs of good granulation at  this time. There does not appear to be any evidence of infection and overall these areas are quite superficial which is good news. With that being said the patient does not appear to have been elevating his legs quite as much as he should during the day which I think has been part of the main issue here. Fortunately there is no evidence of active infection at this time. Electronic Signature(s) Signed: 09/03/2019 4:44:32 PM By: Worthy Keeler PA-C Entered By: Worthy Keeler on 09/03/2019 16:44:31 Steven Meza ([id number]) -------------------------------------------------------------------------------- Physician Orders Details Patient Name: Steven Meza Date of Service: 09/03/2019 3:30 PM Medical Record Number: [id number] Patient Account Number: [id number] Date of Birth/Sex: 05/28/1930 (86 y.o. M) Treating RN: Army Melia Primary Care Provider: Fulton Reek Other Clinician: Referring Provider: Fulton Reek Treating Provider/Extender: Melburn Hake, Lakeshia Dohner Weeks in Treatment: 24 Verbal / Phone Orders: No Diagnosis Coding Wound Cleansing Wound #10 Left,Distal,Medial Lower Leg o Clean wound with Normal Saline. o May shower with protection. - Do not get wrap wet Wound #8 Left,Anterior Lower Leg o Clean wound  with Normal Saline. o May shower with protection. - Do not get wrap wet Wound #9 Left,Proximal,Medial Lower Leg o Clean wound with Normal Saline. o May shower with protection. - Do not get wrap wet Primary Wound Dressing Wound #10 Left,Distal,Medial Lower Leg o Silver Alginate Wound #8 Left,Anterior Lower Leg o Silver Alginate Wound #9 Left,Proximal,Medial Lower Leg o Silver Alginate Secondary Dressing Wound #10 Left,Distal,Medial Lower Leg o ABD pad Wound #8 Left,Anterior Lower Leg o ABD pad Wound #9 Left,Proximal,Medial Lower Leg o ABD pad Dressing Change Frequency Wound #10 Left,Distal,Medial Lower Leg o Dressing is to be changed Monday  and Thursday. - Thursday by [facility] Wound #8 Left,Anterior Lower Leg o Dressing is to be changed Monday and Thursday. - Thursday by [facility] Wound #9 Left,Proximal,Medial Lower Leg o Dressing is to be changed Monday and Thursday. - Thursday by [facility] Follow-up Appointments Wound #10 Left,Distal,Medial Lower Leg o Return Appointment in 1 week. Wound #8 Left,Anterior Lower Leg o Return Appointment in 1 week. Wound #9 Left,Proximal,Medial Lower Leg o Return Appointment in 1 week. Edema Control Wound #10 Left,Distal,Medial Lower Leg o 3 Layer Compression System - Left Lower Extremity o Elevate legs to the level of the heart and pump ankles as often as possible MIC, BUTSCH ([id number]) Wound #8 Left,Anterior Lower Leg o 3 Layer Compression System - Left Lower Extremity o Elevate legs to the level of the heart and pump ankles as often as possible Wound #9 Left,Proximal,Medial Lower Leg o 3 Layer Compression System - Left Lower Extremity o Elevate legs to the level of the heart and pump ankles as often as possible [place] Visits o Home Health Nurse may visit PRN to address patientos wound care needs. o FACE TO FACE ENCOUNTER: MEDICARE and MEDICAID PATIENTS: I certify that this patient is under my care and that I had a face-to-face encounter that meets the physician face-to-face encounter requirements with this patient on this date. The encounter with the patient was in whole or in part for the following MEDICAL CONDITION: (primary reason for [place]) MEDICAL NECESSITY: I certify, that based on my findings, NURSING services are a medically necessary home health service. HOME BOUND STATUS: I certify that my clinical findings support that this patient is homebound (i.e., Due to illness or injury, pt requires aid of supportive devices such as crutches, cane, wheelchairs, walkers, the use of special transportation or the assistance of another  person to leave their place of residence. There is a normal inability to leave the home and doing so requires considerable and taxing effort. Other absences are for medical reasons / religious services and are infrequent or of short duration when for other reasons). o If current dressing causes regression in wound condition, may D/C ordered dressing product/s and apply Normal Saline Moist Dressing daily until next [place] / Other MD appointment. [place] of regression in wound condition at [phone]. o Please direct any NON-WOUND related issues/requests for orders to patient's Primary Care Physician Electronic Signature(s) Signed: 09/05/2019 2:57:49 PM By: Army Melia Signed: 09/06/2019 4:59:38 PM By: Worthy Keeler PA-C Previous Signature: 09/04/2019 2:30:00 PM Version By: Army Melia Previous Signature: 09/04/2019 6:00:40 PM Version By: Worthy Keeler PA-C Previous Signature: 09/03/2019 4:19:51 PM Version By: Army Melia Previous Signature: 09/03/2019 5:05:18 PM Version By: Worthy Keeler PA-C Entered By: Army Melia on 09/05/2019 11:14:11 Steven Meza ([id number]) -------------------------------------------------------------------------------- Problem List Details Patient Name: Steven Meza Date of Service: 09/03/2019  3:30 PM Medical Record Number: [id number] Patient Account Number: [id number] Date of Birth/Sex: February 25, 1931 (86 y.o. M) Treating RN: Army Melia Primary Care Provider: Fulton Reek Other Clinician: Referring Provider: Fulton Reek Treating Provider/Extender: Melburn Hake, Dallis Darden Weeks in Treatment: 84 Active Problems ICD-10 Encounter Meza Description Active Date MDM Diagnosis I89.0 Lymphedema, not elsewhere classified 04/02/2019 No Yes I87.2 Venous insufficiency (chronic) (peripheral) 04/02/2019 No Yes L97.822 Non-pressure chronic ulcer of other part of left lower leg with fat layer 04/02/2019 No Yes exposed [place]  (primary) hypertension 04/02/2019 No Yes I25.10 Atherosclerotic heart disease of native coronary artery without angina 04/02/2019 No Yes pectoris L84 Corns and callosities 05/09/2019 No Yes Inactive Problems Resolved Problems Electronic Signature(s) Signed: 09/03/2019 4:37:34 PM By: Worthy Keeler PA-C Entered By: Worthy Keeler on 09/03/2019 16:37:34 Steven Meza ([id number]) -------------------------------------------------------------------------------- Progress Note Details Patient Name: Steven Meza Date of Service: 09/03/2019 3:30 PM Medical Record Number: [id number] Patient Account Number: [id number] Date of Birth/Sex: Mar 13, 1931 (86 y.o. M) Treating RN: Army Melia Primary Care Provider: Fulton Reek Other Clinician: Referring Provider: Fulton Reek Treating Provider/Extender: Melburn Hake, Christionna Poland Weeks in Treatment: 22 Subjective Chief Complaint Information obtained from Patient Left LE Ulcer History of Present Illness (HPI) ADMISSION 05/17/2018 Mr. Persinger is an 86 year old man who is been treated at the lymphedema clinic for a long period with lymphedema wraps for bilateral lower extremity lymphedema. About 6 months ago they noted small open areas on his left lateral calf just above the ankle. These were open. They are apparently putting some form of ointment on this. They have been referred here for our review of this. The patient has a long history of lymphedema and venous stasis. It says in his records that he has recurrent cellulitis although his wife denies this but she states that the lymphedema may have started with cellulitis several years ago. Also in his record there is a history of bladder cancer which they seem to know very little about however he did not have any radiation to the pelvis or anything that could have contributed to lymphedema that they are aware of. There is no prior wound history. The patient has 2 small punched out areas on the left lateral  calf that have adherent debris on the surface. Past medical history; lymphedema, hypertension, cellulitis, hearing loss, morbid obesity, osteoarthritis, venous stasis, bladder CA, diverticulosis. ABIs in our clinic were 0.36 on the right and 0.57 on the left 05/24/2018; corrected age on this patient is 46 versus what I said last week. He has been for his arterial studies which predictably are not very good. On the right his ABI is 0.65. Monophasic waveforms noted at the right ankle including the posterior tibial and dorsalis pedis. He has triphasic waveforms of the common femoral and profunda femoris triphasic proximal SFA with monophasic distal FSA and popliteal artery. Monophasic tibial waveforms. On the left his ABI is 0.58 monophasic waveforms at the left ankle. Duplex of the left lower extremity demonstrated atherosclerotic change with monophasic waveforms throughout the left lower extremity. There was some concern about left iliac disease and potentially femoral-popliteal and tibial disease. The patient does not describe claudication although according to his wife he over emphasizes his activity. Patient states he is limited by pain in both knees His wounds are on the left lateral calf. 2 small areas with necrotic debris. We have been using silver collagen and light Ace wraps 05/31/2018; wounds on the left lateral calf in the setting of very significant lymphedema and probably  PAD. We have been using silver collagen. The base of the small wound looks reasonably improved. I sent him to see Dr. Fletcher Anon however I see now he has an appointment with Dr. Alvester Chou on February 12 2/5; left lateral calf wound looks the same. His wife is doing a good job with her lymphedema wraps and maintaining that the edema. He most likely has PAD and is due to see Dr. Gwenlyn Found of infectious disease on February 12 2/19; left lateral calf wounds look about the same. This looks like wound secondary to chronic venous  insufficiency with lymphedema however the patient has very poor arterial studies. We referred him to Dr. Gwenlyn Found. Dr. Gwenlyn Found did not feel he needed to do anything from an arterial point of view. He did not address the question about the aggressiveness of compression. After some thoughts about this I elected to go ahead and put him in 3 layer compression which after all would be less compression then the lymphedema clinic was putting on this. I am hopeful that this should be enough to get some closure of the small wounds 2/26; left lateral calf wound letter this week. He tolerated the 3 layer compression well furthermore he is sleeping in a hospital bed which helps keep his legs up at night. The wound looks better measuring smaller especially in width 3/3; left lateral calf wound about the same size. The 3 layer compression has really helped get the edema down in his leg however. Using silver collagen 3/11; left lateral calf wound better looking wound surface but about the same size. We have been using 3 layer compression which is really helped get the edema down in the left leg. Even after Dr. Kennon Holter assessment I am reluctant to go to 4 layer compression. He is tolerating 3 layer compression well. We have been using silver collagen 3/18-Patient returns for his left lateral calf wound which overall is looking better, slightly increased in size and dimensions and area, he is tolerating the 3 layer compression, has been dressed with silver collagen which we will continue 3/25 left lateral calf wound much better looking. Size is smaller. He has been using silver collagen 4/1; gradually getting smaller in size. Using silver collagen with 3 layer compression. I think the patient has some degree of PAD. He saw Dr. Gwenlyn Found in consultation, he did not specifically comment on whether he could tolerate 4 layer compression 4/8; the wound is slightly smaller in depth. We are using 3 layer compression with silver  collagen. I had him seen for arterial insufficiency however his ABI was 0.57 in the left leg. Dr. Gwenlyn Found really did not seem to middle on the degree of compression he could tolerate 4/15; the wound is slightly smaller and slightly less deep. We have been using 3 layer compression with silver collagen. There is not an option for 4-layer compression here. 4/22; the patient is making decent progress on his wound on the left lateral calf however he arrives in with a superficial wound on the right anterior tibial area. Were not really sure how this happened. He had been using a stocking on the right leg. He has been using silver collagen on the wounds Amazing to me he is he appears to have compression pumps at home. I am not really sure I knew this before. In any case he has not been using them 4/29; left lateral calf wound continues to get gradually smaller. This looks like it is on its way to closure. The area on  the right anterior tibial area is about the same in terms of size superficial without any depth. This was probably wrap injury He tells me that he has been using his compression pumps once a day for 1 hour without any pain Steven Meza, Steven Meza ([id number]) 5/6; not as good today. Left lateral calf wound is actually larger and he does not have quite as good edema control. The area on the right anterior tibia is about the same still superficial. He states he is using his compression pumps once a day. We are using 3 layer compression on the left and Curlex Coban on the right 5/13; the area on the right is very close to being healed. The area on the left still has some depth but has a healthy wound surface. He has excellent edema control bilaterally. Using Hydrofera Blue 5/20-Patient returns in 1 week after being in 3 layer compression on the left, the left lateral calf ulcer is stable with some slough, the right anterior tibial ulcer is healed we have been using Kerlix Coban on that leg 5/27;  Hydrofera Blue and 3 layer compression. Still some slough in the wound area and some nonviable edges around the wound. 6/3; this patient has lymphedema and chronic venous insufficiency. He had difficult to close wound on his left lateral calf. This is closed today. 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 Finally he has known PAD but he is managed to tolerate the compression we reporting on him and also using his compression pumps that he already had at home twice a day 6/10; we discharge this patient last week having finally close the small area in the left lateral calf. Apparently by Friday of last week this had opened and was draining. He is therefore back in clinic. His wife who is present also was concerned about an area on the right anterior tibia."o 6/17; the patient does not have an open area on the right leg. On the left he has a small circular area anteriorly and the same area laterally that he had last time. However both of these appear to be improved he has his [place] wrap 30/40. It turns out that he is only using his external compression pumps once a day. I tried to get him up to twice a day today 6/24; the patient comes in with his external compression garment on the right leg/F[place] wrap. As far as we know no open wounds here. He has 2 areas that are smaller one on the left anterior pretibial and one on the left lateral calf which is the site of his initial wounds. I have successfully caught him into his external compression pumps twice a day 7/1; still using his external compression garment on the right leg. As far as we know there are no open wounds here. The area on the left lateral calf which was the site of his initial wound has closed over. The area on the left anterior pretibial that we identified last week is still open. 7/8; as far as we know he has no open wounds on the right leg. He has his external compression garment on here. The  original wound on the left lateral calf has closed over. He developed an area on the left anterior pretibial 2 weeks ago and seems to have 2 smaller areas on the proximal and distal lateral calf. We do not have his good edema control this week as I am used to seeing 7/15; the patient has  still a linear area on the left anterior tibia. The area on the left lateral leg looked somewhat inflamed today but no cellulitis. He did have what looked to be a small pustule that I cultured for CandS although I did not give him empiric antibiotics. 7/22; small area on the left anterior tibia. The area on the left lateral leg still looks inflamed. The pustule that I cultured last week grew Enterobacter and Pseudomonas. I prescribed Cipro they still have not picked that up. In any case the area actually looks fairly satisfactory. He has decent edema control using his compression pumps twice a day 7/29; the area on the left lateral leg looks a lot better than last week. Nothing is open here. He has an area just medial to the mid anterior tibia that is still open that is only open wound today. 8/5-Patient returns at 1 week for the left leg ulcer, which is actually looking better, he is also complaining of left plantar foot pain below the great toe. He does have a hematoma here 8/12-Patient returns at 1 week, the left leg open area looks about the same if not slightly better, the left plantar hematoma under the great toe is about the same, patient apparently has not been resting his foot on anything for too long period of time and does not wear the Birkenstock shoes unless he walks outdoors which he does not do often 8/19; small wound on the left anterior lower leg this is just about closed there is nothing on the left lateral leg. He is using his compression pumps He arrives in clinic today with a black eschar over the right first plantar metatarsal head. This was apparently a bruise that was identified by her nurses  last week. He wears crocs on his feet. He also may rub these on a footboard at home 8/26; there is nothing open on the left lateral lower leg he has a small area just medial to the tibia. He is going to see his podiatrist Dr. Milinda Pointer about the area on the left plantar first metatarsal head. He is using his external compression pumps. He has a juxta lite on the right leg 9/2; there is nothing open on either side of her left lower leg. He went to see podiatry about the area on the left first metatarsal head. This was apparently debrided. He was given mupirocin and oral antibiotics. I do not believe he is offloading this at all. Readmission: 04/02/2019 upon evaluation today patient actually appears for reevaluation here in our clinic secondary to issues that he is having with his left lower extremity. He has been tolerating the dressing changes without complication. Fortunately there is no signs of active infection at this time. No fevers, chills, nausea, vomiting, or diarrhea. He has been in the hospital since we last saw him and then subsequently transferred back to [facility]. He tells me that they have been changing his dressing once a day and that he has had a significant amount of pain secondary to undergoing these dressing changes. Fortunately there is no evidence of active infection at this point which is good news. No fevers, chills, nausea, vomiting, or diarrhea. With that being said he wonders if we can go back to doing what we were doing before which was when he actually had the dressing changes performed once a week here in the clinic he tells me that he did very well with that. At that point he tolerated a 3 layer compression wrap which  was approved by vascular, Dr. Tyrell Antonio office, to be used previous although 4-layer compression wrap is probably too much for him. 04/16/2019 on evaluation today patient appears to be doing decently well with regard to his lower extremity although he has  had a lot of drainage from his leg. A lot of this I think is just secondary to the lymphedema with that being said he does have some odor as well which may again just be due to more of the excessive moisture although I think that he may also have something that is a result of bacteria again he has somewhat of a Pseudomonas-like smell to the drainage and fortunately though he is not showing any signs of severe cellulitis he is still experiencing quite a bit of drainage and the discoloration is somewhat of a yellow/green in color which is consistent with Pseudomonas. If he can tolerated I think he may do well with Cipro. 04/24/2019 on evaluation today patient appears to be doing well with regard to his wound in the gluteal region which is healing quite well. That is excellent news. With that being said he is also doing better with regard to his left lower extremity overall I feel like things are definitely showing signs of improvement. 12/28-Patient returns with regards to his gluteal wound that is improving, Left lower leg wounds with significant edema in the lower legs that appear to be persistent. Patient is in compression and has lymphedema pumps at home but apparently he does not use consistently 05/07/2019 Upon inspection patient's wound bed actually showed signs on evaluation today patient's wound currently showed signs of improvement. There is still a lot of maceration but it does look like that there is been some of this maceration occurring as a result of drainage in particular and some as a result of the fact that they have been putting bag balm on the wound area as well as the leg in general. I think this is applying too much moisture and not doing him any good. I discussed with the patient and his wife today that I think that practice needs to be adjusted I would recommend using a different type of lotion in order to help with moisture around the wound but I would not recommend anything  directly on the wound itself. 05/14/2019 on evaluation today patient actually appears to be doing much better in regard to his lower extremity. They have not been using any of Steven Meza, Steven A. ([id number]) the bag balm on the area I think this has made a tremendous improvement overall in his wounds. He has a lot of new skin growth and though there still are a lot of open wounds scattered throughout the circumferential lower leg this overall seems to be doing much better to me. 05/21/2019 upon evaluation today patient appears to be doing well in regard to his lower extremity although he is having unfortunately some blue- green drainage noted at this point. The does have me concerned for a reinitiation of the Pseudomonas infection that we previously treated in December. The last time I gave him a prescription was April 16, 2019. That was a little over a month ago. Nonetheless he does have more green drainage noted today and I think this is indicative of potentially a revving up infection. Obviously we do not want to get to that point. 05/28/2019 on evaluation today patient appears to be doing better with regard to his lower extremity at this point. Overall I am seeing signs of improvement which is  good news. Fortunately there is no evidence of active infection at this time. I do believe that the Cipro has been beneficial for him that was prescribed last week for 14 days he is halfway through that and already appears to be doing much better. This is excellent news. 06/04/2019 upon evaluation today I do believe that the patient is doing better with regard to his lower extremity. I feel like that he has much less in the way of open wounds and drainage and much in provement in regard to the overall weeping and even his swelling. There is no signs of erythema no obvious evidence of active infection all of which is great news. No fevers, chills, nausea, vomiting, or diarrhea. 06/11/2019 on evaluation today  patient appears to be doing better with regard to his lower extremity on the left. He has been tolerating dressing changes without complication. Fortunately there is no signs of active infection at this time. No fevers, chills, nausea, vomiting, or diarrhea. 06/18/2019 upon evaluation today patient appears to be doing slightly worse compared to last week. They did call me on Friday due to the fact that he had green discharge and both his wife as well as his home health nurse were concerned about what they were seeing. Subsequently I did actually send in a prescription for Cipro for 14 days for him and according to his wife this seems to be doing better based on what she is seeing today compared to Friday. This is great news. Home health is still coming out to see him on Wednesdays and Fridays as well. 06/25/2019 upon evaluation today patient appears to be doing somewhat better in regard to his leg ulcer. Fortunately there is no signs of active infection at this time which is good news. With that being said I am very pleased with how things seem to be going and overall I see no major issues here I think the alginate is doing well along with the XtraSorb. With regard to the patient's plantar foot he does have somewhat of a blood blister that appears to be resolving at this point his wife states that she feels like the issue is that he is not dropping his shoe in order to keep it from sliding when he walks. Subsequently this is leading to increases in friction which of course is leading to more issues with getting this area to heal the patient is struggling in that regard. 07/02/2019 upon evaluation today patient appears to be making some progress in regard to his lower extremities. With that being said I really feel like the alginate may be getting too moist and then sitting on the skin causing some complications here for [place]. I discussed with the patient and his wife today as well the possibility of going  ahead and discontinuing the alginate just utilizing the Xtrasorb to try to keep the edema under better control while at the same time preventing any dressings from staying too moist right on the surface of the wounds. Obviously I think this can do a good job in helping to dry things out but I would recommend that we give this a shot. 07/09/2019 upon evaluation today patient appears to be doing better in regard to some of the edema and weeping in regard to the left lower extremity. Unfortunately he has not been wearing his compression of the right lower extremity and now that is open as well. He needs to obviously be wearing this although for today we will get a half  to wrap him due to the fact that this is now open. I explained that he does need to be wearing his compression at all times. 07/16/2019 upon evaluation today patient actually does appear to be making some progress in regard to his lower extremity ulcers at this point. In fact he really does not have any ulcerations as much as he just does have lymphedema breakdown causing weeping. Nonetheless he is drying up quite a bit but overall seems to be doing much better. He has a lot of dry flaky skin I think we may want to add triamcinolone to the skin to try to help out with cutting back on some of the irritation here hopefully improve the overall skin quality. He does have Velcro compression wraps once he heals that is good news as well. 07/23/2019 upon evaluation today patient appears to be doing better with regard to his bilateral lower extremities. I think we are getting closer to getting everything sealed up. Again he does not have any significant wounds and his lymphedema has dramatically improved. 07/30/2019 upon evaluation today patient appears to be doing excellent in regard to his bilateral lower extremities. Fortunately there is no signs of active infection at this time. Overall the patient seems to be in fact completely sealed up as far as  any areas of weeping or concern which also has me thrilled based on what we are seeing currently. Overall I think that he is doing extremely well. 09/03/2019 upon evaluation today patient appears to be doing somewhat poorly in regard to his left lower extremity with regard to open wounds. Fortunately there is nothing that appears to be draining too terribly although he has about 3 different areas that are open as far as wounds are concerned all 3 seem to be fairly superficial. He was discharged from our clinic on 07/30/2019. Subsequently he reopened in the interim apparently at home health never completely discharged home from being seen. They in fact have still continue to wrap his left leg up until now. Objective Constitutional Obese and well-hydrated in no acute distress. Vitals Time Taken: 3:30 PM, Height: 65 in, Weight: 273 lbs, BMI: 45.4, Temperature: 97.9 F, Pulse: 73 bpm, Respiratory Rate: 18 breaths/min, Blood Pressure: 139/51 mmHg. Respiratory ADRION, Steven Meza ([id number]) normal breathing without difficulty. Psychiatric this patient is able to make decisions and demonstrates good insight into disease process. Alert and Oriented x 3. pleasant and cooperative. General Notes: Upon inspection patient's wound bed actually showed signs of good granulation at this time. There does not appear to be any evidence of infection and overall these areas are quite superficial which is good news. With that being said the patient does not appear to have been elevating his legs quite as much as he should during the day which I think has been part of the main issue here. Fortunately there is no evidence of active infection at this time. Integumentary (Hair, Skin) Wound #10 status is Open. Original cause of wound was Gradually Appeared. The wound is located on the Left,Distal,Medial Lower Leg. The wound measures 0.6cm length x 0.8cm width x 0.1cm depth; 0.377cm^2 area and 0.038cm^3 volume. There is Fat  Layer (Subcutaneous Tissue) Exposed exposed. There is no tunneling or undermining noted. There is a medium amount of serous drainage noted. The wound margin is flat and intact. There is large (67-100%) pink granulation within the wound bed. There is a small (1-33%) amount of necrotic tissue within the wound bed including Adherent Slough. Wound #8 status is  Open. Original cause of wound was Gradually Appeared. The wound is located on the Left,Anterior Lower Leg. The wound measures 0.8cm length x 0.6cm width x 0.1cm depth; 0.377cm^2 area and 0.038cm^3 volume. There is Fat Layer (Subcutaneous Tissue) Exposed exposed. There is no tunneling or undermining noted. There is a medium amount of serous drainage noted. The wound margin is flat and intact. There is large (67-100%) pink granulation within the wound bed. There is no necrotic tissue within the wound bed. Wound #9 status is Open. Original cause of wound was Gradually Appeared. The wound is located on the Left,Proximal,Medial Lower Leg. The wound measures 1cm length x 0.9cm width x 0.1cm depth; 0.707cm^2 area and 0.071cm^3 volume. There is Fat Layer (Subcutaneous Tissue) Exposed exposed. There is no tunneling or undermining noted. There is a medium amount of serous drainage noted. The wound margin is flat and intact. There is large (67-100%) pink granulation within the wound bed. There is a small (1-33%) amount of necrotic tissue within the wound bed including Adherent Slough. Assessment Active Problems ICD-10 Lymphedema, not elsewhere classified Venous insufficiency (chronic) (peripheral) Non-pressure chronic ulcer of other part of left lower leg with fat layer exposed Essential (primary) hypertension Atherosclerotic heart disease of native coronary artery without angina pectoris Corns and callosities Procedures Wound #10 Pre-procedure diagnosis of Wound #10 is a Lymphedema located on the Left,Distal,Medial Lower Leg . There was a Three Layer  Compression Therapy Procedure by Army Melia, RN. Post procedure Diagnosis Wound #10: Same as Pre-Procedure Wound #8 Pre-procedure diagnosis of Wound #8 is a Lymphedema located on the Left,Anterior Lower Leg . There was a Three Layer Compression Therapy Procedure by Army Melia, RN. Post procedure Diagnosis Wound #8: Same as Pre-Procedure Wound #9 Pre-procedure diagnosis of Wound #9 is a Lymphedema located on the Left,Proximal,Medial Lower Leg . There was a Three Layer Compression Therapy Procedure by Army Melia, RN. Post procedure Diagnosis Wound #9: Same as Pre-Procedure Plan Steven Meza, Steven Meza ([id number]) Wound Cleansing: Wound #10 Left,Distal,Medial Lower Leg: Clean wound with Normal Saline. May shower with protection. - Do not get wrap wet Wound #8 Left,Anterior Lower Leg: Clean wound with Normal Saline. May shower with protection. - Do not get wrap wet Wound #9 Left,Proximal,Medial Lower Leg: Clean wound with Normal Saline. May shower with protection. - Do not get wrap wet Primary Wound Dressing: Wound #10 Left,Distal,Medial Lower Leg: Silver Alginate Wound #8 Left,Anterior Lower Leg: Silver Alginate Wound #9 Left,Proximal,Medial Lower Leg: Silver Alginate Secondary Dressing: Wound #10 Left,Distal,Medial Lower Leg: ABD pad Wound #8 Left,Anterior Lower Leg: ABD pad Wound #9 Left,Proximal,Medial Lower Leg: ABD pad Dressing Change Frequency: Wound #10 Left,Distal,Medial Lower Leg: Change dressing every week Wound #8 Left,Anterior Lower Leg: Change dressing every week Wound #9 Left,Proximal,Medial Lower Leg: Change dressing every week Follow-up Appointments: Wound #10 Left,Distal,Medial Lower Leg: Return Appointment in 1 week. Wound #8 Left,Anterior Lower Leg: Return Appointment in 1 week. Wound #9 Left,Proximal,Medial Lower Leg: Return Appointment in 1 week. Edema Control: Wound #10 Left,Distal,Medial Lower Leg: 3 Layer Compression System - Left Lower  Extremity Elevate legs to the level of the heart and pump ankles as often as possible Wound #8 Left,Anterior Lower Leg: 3 Layer Compression System - Left Lower Extremity Elevate legs to the level of the heart and pump ankles as often as possible Wound #9 Left,Proximal,Medial Lower Leg: 3 Layer Compression System - Left Lower Extremity Elevate legs to the level of the heart and pump ankles as often as possible 1. My suggestion currently is  good be that we go ahead and initiate treatment with a continuation of the silver alginate dressing. 2. We will also continue with a 3 layer compression wrap for the left lower extremity. 3. I would also recommend the patient really needs to elevate his legs he should be sleeping in his bed he has a really nice hospital bed. With that being said he states is not comfortable and he will not do this. He tells me he will try to keep his legs more elevated during the day however. We will see patient back for reevaluation in 1 week here in the clinic. If anything worsens or changes patient will contact our office for additional recommendations. Electronic Signature(s) Signed: 09/03/2019 4:45:22 PM By: Worthy Keeler PA-C Entered By: Worthy Keeler on 09/03/2019 16:45:22 Steven Meza ([id number]) -------------------------------------------------------------------------------- SuperBill Details Patient Name: Steven Meza Date of Service: 09/03/2019 Medical Record Number: [id number] Patient Account Number: [id number] Date of Birth/Sex: 08-25-1930 (86 y.o. M) Treating RN: Army Melia Primary Care Provider: Fulton Reek Other Clinician: Referring Provider: Fulton Reek Treating Provider/Extender: Melburn Hake, Kimari Coudriet Weeks in Treatment: 22 Diagnosis Coding ICD-10 Codes Meza Description I89.0 Lymphedema, not elsewhere classified I87.2 Venous insufficiency (chronic) (peripheral) L97.822 Non-pressure chronic ulcer of other part of left lower leg with fat  layer exposed I10 Essential (primary) hypertension I25.10 Atherosclerotic heart disease of native coronary artery without angina pectoris L84 Corns and callosities Facility Procedures CPT4 Meza: [id number] Description: (Facility Use Only) [phone] - [facility] LWR LT LEG Modifier: Quantity: 1 Physician Procedures CPT4 [id number] Description: [id number] - WC PHYS LEVEL 3 - EST PT Modifier: Quantity: 1 CPT4 Meza: Description: ICD-10 Diagnosis Description I89.0 Lymphedema, not elsewhere classified I87.2 Venous insufficiency (chronic) (peripheral) L97.822 Non-pressure chronic ulcer of other part of left lower leg with fat lay [place] (primary) hypertension Modifier: er exposed Quantity: Electronic Signature(s) Signed: 09/03/2019 4:46:21 PM By: Worthy Keeler PA-C Previous Signature: 09/03/2019 4:19:51 PM Version By: Army Melia Entered By: Worthy Keeler on 09/03/2019 16:46:21

## 2019-09-10 ENCOUNTER — Ambulatory Visit (INDEPENDENT_AMBULATORY_CARE_PROVIDER_SITE_OTHER): Payer: Medicare Other | Admitting: Podiatry

## 2019-09-10 ENCOUNTER — Encounter: Payer: Self-pay | Admitting: Podiatry

## 2019-09-10 ENCOUNTER — Encounter: Payer: Medicare Other | Admitting: Physician Assistant

## 2019-09-10 ENCOUNTER — Other Ambulatory Visit: Payer: Self-pay

## 2019-09-10 DIAGNOSIS — M79674 Pain in right toe(s): Secondary | ICD-10-CM

## 2019-09-10 DIAGNOSIS — M79675 Pain in left toe(s): Secondary | ICD-10-CM | POA: Diagnosis not present

## 2019-09-10 DIAGNOSIS — B351 Tinea unguium: Secondary | ICD-10-CM | POA: Diagnosis not present

## 2019-09-10 DIAGNOSIS — I890 Lymphedema, not elsewhere classified: Secondary | ICD-10-CM | POA: Diagnosis not present

## 2019-09-10 NOTE — Progress Notes (Addendum)
Steven Meza ([id number]) Visit Report for 09/10/2019 Chief Complaint Document Details Patient Name: Steven Meza, Steven Meza Date of Service: 09/10/2019 3:15 PM Medical Record Number: [id number] Patient Account Number: [id number] Date of Birth/Sex: Nov 09, 1930 (86 y.o. M) Treating RN: Army Melia Primary Care Provider: Fulton Reek Other Clinician: Referring Provider: Fulton Reek Treating Provider/Extender: Melburn Hake, Takota Cahalan Weeks in Treatment: 23 Information Obtained from: Patient Chief Complaint Left LE Ulcer Electronic Signature(s) Signed: 09/10/2019 3:54:21 PM By: Worthy Keeler PA-C Entered By: Worthy Keeler on 09/10/2019 15:54:21 Steven Meza ([id number]) -------------------------------------------------------------------------------- HPI Details Patient Name: Steven Meza Date of Service: 09/10/2019 3:15 PM Medical Record Number: [id number] Patient Account Number: [id number] Date of Birth/Sex: Nov 30, 1930 (86 y.o. M) Treating RN: Army Melia Primary Care Provider: Fulton Reek Other Clinician: Referring Provider: Fulton Reek Treating Provider/Extender: Melburn Hake, Olayinka Gathers Weeks in Treatment: 23 History of Present Illness HPI Description: ADMISSION 05/17/2018 Steven Meza is an 86 year old man who is been treated at the lymphedema clinic for a long period with lymphedema wraps for bilateral lower extremity lymphedema. About 6 months ago they noted small open areas on his left lateral calf just above the ankle. These were open. They are apparently putting some form of ointment on this. They have been referred here for our review of this. The patient has a long history of lymphedema and venous stasis. It says in his records that he has recurrent cellulitis although his wife denies this but she states that the lymphedema may have started with cellulitis several years ago. Also in his record there is a history of bladder cancer which they seem to know very little about  however he did not have any radiation to the pelvis or anything that could have contributed to lymphedema that they are aware of. There is no prior wound history. The patient has 2 small punched out areas on the left lateral calf that have adherent debris on the surface. Past medical history; lymphedema, hypertension, cellulitis, hearing loss, morbid obesity, osteoarthritis, venous stasis, bladder CA, diverticulosis. ABIs in our clinic were 0.36 on the right and 0.57 on the left 05/24/2018; corrected age on this patient is 16 versus what I said last week. He has been for his arterial studies which predictably are not very good. On the right his ABI is 0.65. Monophasic waveforms noted at the right ankle including the posterior tibial and dorsalis pedis. He has triphasic waveforms of the common femoral and profunda femoris triphasic proximal SFA with monophasic distal FSA and popliteal artery. Monophasic tibial waveforms. On the left his ABI is 0.58 monophasic waveforms at the left ankle. Duplex of the left lower extremity demonstrated atherosclerotic change with monophasic waveforms throughout the left lower extremity. There was some concern about left iliac disease and potentially femoral-popliteal and tibial disease. The patient does not describe claudication although according to his wife he over emphasizes his activity. Patient states he is limited by pain in both knees His wounds are on the left lateral calf. 2 small areas with necrotic debris. We have been using silver collagen and light Ace wraps 05/31/2018; wounds on the left lateral calf in the setting of very significant lymphedema and probably PAD. We have been using silver collagen. The base of the small wound looks reasonably improved. I sent him to see Dr. Fletcher Anon however I see now he has an appointment with Dr. Alvester Chou on February 12 2/5; left lateral calf wound looks the same. His wife is doing a good job with her lymphedema wraps and  maintaining that the edema. He most likely has PAD and is due to see Dr. Gwenlyn Found of infectious disease on February 12 2/19; left lateral calf wounds look about the same. This looks like wound secondary to chronic venous insufficiency with lymphedema however the patient has very poor arterial studies. We referred him to Dr. Gwenlyn Found. Dr. Gwenlyn Found did not feel he needed to do anything from an arterial point of view. He did not address the question about the aggressiveness of compression. After some thoughts about this I elected to go ahead and put him in 3 layer compression which after all would be less compression then the lymphedema clinic was putting on this. I am hopeful that this should be enough to get some closure of the small wounds 2/26; left lateral calf wound letter this week. He tolerated the 3 layer compression well furthermore he is sleeping in a hospital bed which helps keep his legs up at night. The wound looks better measuring smaller especially in width 3/3; left lateral calf wound about the same size. The 3 layer compression has really helped get the edema down in his leg however. Using silver collagen 3/11; left lateral calf wound better looking wound surface but about the same size. We have been using 3 layer compression which is really helped get the edema down in the left leg. Even after Dr. Kennon Holter assessment I am reluctant to go to 4 layer compression. He is tolerating 3 layer compression well. We have been using silver collagen 3/18-Patient returns for his left lateral calf wound which overall is looking better, slightly increased in size and dimensions and area, he is tolerating the 3 layer compression, has been dressed with silver collagen which we will continue 3/25oleft lateral calf wound much better looking. Size is smaller. He has been using silver collagen 4/1; gradually getting smaller in size. Using silver collagen with 3 layer compression. I think the patient has some  degree of PAD. He saw Dr. Gwenlyn Found in consultation, he did not specifically comment on whether he could tolerate 4 layer compression 4/8; the wound is slightly smaller in depth. We are using 3 layer compression with silver collagen. I had him seen for arterial insufficiency however his ABI was 0.57 in the left leg. Dr. Gwenlyn Found really did not seem to middle on the degree of compression he could tolerate 4/15; the wound is slightly smaller and slightly less deep. We have been using 3 layer compression with silver collagen. There is not an option for 4-layer compression here. 4/22; the patient is making decent progress on his wound on the left lateral calf however he arrives in with a superficial wound on the right anterior tibial area. Were not really sure how this happened. He had been using a stocking on the right leg. He has been using silver collagen on the wounds Amazing to me he is he appears to have compression pumps at home. I am not really sure I knew this before. In any case he has not been using them 4/29; left lateral calf wound continues to get gradually smaller. This looks like it is on its way to closure. oThe area on the right anterior tibial area is about the same in terms of size superficial without any depth. This was probably wrap injury oHe tells me that he has been using his compression pumps once a day for 1 hour without any pain 5/6; not as good today. Left lateral calf wound is actually larger and he does not have  quite as good edema control. The area on the right anterior tibia is about the same still superficial. He states he is using his compression pumps once a day. We are using 3 layer compression on the left and Curlex Coban on the right 5/13; the area on the right is very close to being healed. The area on the left still has some depth but has a healthy wound surface. He has excellent edema control bilaterally. Using [facility], [facility]  ([id number]) 5/20-Patient returns in 1 week after being in 3 layer compression on the left, the left lateral calf ulcer is stable with some slough, the right anterior tibial ulcer is healed we have been using Kerlix Coban on that leg 5/27; Hydrofera Blue and 3 layer compression. Still some slough in the wound area and some nonviable edges around the wound. 6/3; this patient has lymphedema and chronic venous insufficiency. He had difficult to close wound on his left lateral calf. This is closed today. 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 Finally he has known PAD but he is managed to tolerate the compression we reporting on him and also using his compression pumps that he already had at home twice a day 6/10; we discharge this patient last week having finally close the small area in the left lateral calf. Apparently by Friday of last week this had opened and was draining. He is therefore back in clinic. His wife who is present also was concerned about an area on the right anterior tibia."o 6/17; the patient does not have an open area on the right leg. On the left he has a small circular area anteriorly and the same area laterally that he had last time. However both of these appear to be improved he has his Farrow wrap 30/40. It turns out that he is only using his external compression pumps once a day. I tried to get him up to twice a day today 6/24; the patient comes in with his external compression garment on the right leg/Farrow wrap. As far as we know no open wounds here. He has 2 areas that are smaller one on the left anterior pretibial and one on the left lateral calf which is the site of his initial wounds. I have successfully caught him into his external compression pumps twice a day 7/1; still using his external compression garment on the right leg. As far as we know there are no open wounds here. The area on the left lateral calf which was the site of  his initial wound has closed over. The area on the left anterior pretibial that we identified last week is still open. 7/8; as far as we know he has no open wounds on the right leg. He has his external compression garment on here. The original wound on the left lateral calf has closed over. He developed an area on the left anterior pretibial 2 weeks ago and seems to have 2 smaller areas on the proximal and distal lateral calf. We do not have his good edema control this week as I am used to seeing 7/15; the patient has still a linear area on the left anterior tibia. The area on the left lateral leg looked somewhat inflamed today but no cellulitis. He did have what looked to be a small pustule that I cultured for CandS although I did not give him empiric antibiotics. 7/22; small area on the left anterior tibia. The area on the left  lateral leg still looks inflamed. The pustule that I cultured last week grew Enterobacter and Pseudomonas. I prescribed Cipro they still have not picked that up. In any case the area actually looks fairly satisfactory. He has decent edema control using his compression pumps twice a day 7/29; the area on the left lateral leg looks a lot better than last week. Nothing is open here. He has an area just medial to the mid anterior tibia that is still open that is only open wound today. 8/5-Patient returns at 1 week for the left leg ulcer, which is actually looking better, he is also complaining of left plantar foot pain below the great toe. He does have a hematoma here 8/12-Patient returns at 1 week, the left leg open area looks about the same if not slightly better, the left plantar hematoma under the great toe is about the same, patient apparently has not been resting his foot on anything for too long period of time and does not wear the Birkenstock shoes unless he walks outdoors which he does not do often 8/19; small wound on the left anterior lower leg this is just about  closed there is nothing on the left lateral leg. He is using his compression pumps He arrives in clinic today with a black eschar over the right first plantar metatarsal head. This was apparently a bruise that was identified by her nurses last week. He wears crocs on his feet. He also may rub these on a footboard at home 8/26; there is nothing open on the left lateral lower leg he has a small area just medial to the tibia. He is going to see his podiatrist Dr. Milinda Pointer about the area on the left plantar first metatarsal head. He is using his external compression pumps. He has a juxta lite on the right leg 9/2; there is nothing open on either side of her left lower leg. He went to see podiatry about the area on the left first metatarsal head. This was apparently debrided. He was given mupirocin and oral antibiotics. I do not believe he is offloading this at all. Readmission: 04/02/2019 upon evaluation today patient actually appears for reevaluation here in our clinic secondary to issues that he is having with his left lower extremity. He has been tolerating the dressing changes without complication. Fortunately there is no signs of active infection at this time. No fevers, chills, nausea, vomiting, or diarrhea. He has been in the hospital since we last saw him and then subsequently transferred back to [facility]. He tells me that they have been changing his dressing once a day and that he has had a significant amount of pain secondary to undergoing these dressing changes. Fortunately there is no evidence of active infection at this point which is good news. No fevers, chills, nausea, vomiting, or diarrhea. With that being said he wonders if we can go back to doing what we were doing before which was when he actually had the dressing changes performed once a week here in the clinic he tells me that he did very well with that. At that point he tolerated a 3 layer compression wrap which was approved by  vascular, Dr. Tyrell Antonio office, to be used previous although 4-layer compression wrap is probably too much for him. 04/16/2019 on evaluation today patient appears to be doing decently well with regard to his lower extremity although he has had a lot of drainage from his leg. A lot of this I think is just secondary  to the lymphedema with that being said he does have some odor as well which may again just be due to more of the excessive moisture although I think that he may also have something that is a result of bacteria again he has somewhat of a Pseudomonas-like smell to the drainage and fortunately though he is not showing any signs of severe cellulitis he is still experiencing quite a bit of drainage and the discoloration is somewhat of a yellow/green in color which is consistent with Pseudomonas. If he can tolerated I think he may do well with Cipro. 04/24/2019 on evaluation today patient appears to be doing well with regard to his wound in the gluteal region which is healing quite well. That is excellent news. With that being said he is also doing better with regard to his left lower extremity overall I feel like things are definitely showing signs of improvement. 12/28-Patient returns with regards to his gluteal wound that is improving, Left lower leg wounds with significant edema in the lower legs that appear to be persistent. Patient is in compression and has lymphedema pumps at home but apparently he does not use consistently 05/07/2019 Upon inspection patient's wound bed actually showed signs on evaluation today patient's wound currently showed signs of improvement. There is still a lot of maceration but it does look like that there is been some of this maceration occurring as a result of drainage in particular and some as a result of the fact that they have been putting bag balm on the wound area as well as the leg in general. I think this is applying too much moisture and not doing him any  good. I discussed with the patient and his wife today that I think that practice needs to be adjusted I would recommend using a different type of lotion in order to help with moisture around the wound but I would not recommend anything directly on the wound itself. 05/14/2019 on evaluation today patient actually appears to be doing much better in regard to his lower extremity. They have not been using any of the bag balm on the area I think this has made a tremendous improvement overall in his wounds. He has a lot of new skin growth and though there still are a lot of open wounds scattered throughout the circumferential lower leg this overall seems to be doing much better to me. 05/21/2019 upon evaluation today patient appears to be doing well in regard to his lower extremity although he is having unfortunately some blue- green drainage noted at this point. The does have me concerned for a reinitiation of the Pseudomonas infection that we previously treated in Steven Meza, Steven Meza ([id number]) December. The last time I gave him a prescription was April 16, 2019. That was a little over a month ago. Nonetheless he does have more green drainage noted today and I think this is indicative of potentially a revving up infection. Obviously we do not want to get to that point. 05/28/2019 on evaluation today patient appears to be doing better with regard to his lower extremity at this point. Overall I am seeing signs of improvement which is good news. Fortunately there is no evidence of active infection at this time. I do believe that the Cipro has been beneficial for him that was prescribed last week for 14 days he is halfway through that and already appears to be doing much better. This is excellent news. 06/04/2019 upon evaluation today I do believe that the patient  is doing better with regard to his lower extremity. I feel like that he has much less in the way of open wounds and drainage and much in provement in  regard to the overall weeping and even his swelling. There is no signs of erythema no obvious evidence of active infection all of which is great news. No fevers, chills, nausea, vomiting, or diarrhea. 06/11/2019 on evaluation today patient appears to be doing better with regard to his lower extremity on the left. He has been tolerating dressing changes without complication. Fortunately there is no signs of active infection at this time. No fevers, chills, nausea, vomiting, or diarrhea. 06/18/2019 upon evaluation today patient appears to be doing slightly worse compared to last week. They did call me on Friday due to the fact that he had green discharge and both his wife as well as his home health nurse were concerned about what they were seeing. Subsequently I did actually send in a prescription for Cipro for 14 days for him and according to his wife this seems to be doing better based on what she is seeing today compared to Friday. This is great news. Home health is still coming out to see him on Wednesdays and Fridays as well. 06/25/2019 upon evaluation today patient appears to be doing somewhat better in regard to his leg ulcer. Fortunately there is no signs of active infection at this time which is good news. With that being said I am very pleased with how things seem to be going and overall I see no major issues here I think the alginate is doing well along with the XtraSorb. With regard to the patient's plantar foot he does have somewhat of a blood blister that appears to be resolving at this point his wife states that she feels like the issue is that he is not dropping his shoe in order to keep it from sliding when he walks. Subsequently this is leading to increases in friction which of course is leading to more issues with getting this area to heal the patient is struggling in that regard. 07/02/2019 upon evaluation today patient appears to be making some progress in regard to his lower extremities.  With that being said I really feel like the alginate may be getting too moist and then sitting on the skin causing some complications here for [place]. I discussed with the patient and his wife today as well the possibility of going ahead and discontinuing the alginate just utilizing the Xtrasorb to try to keep the edema under better control while at the same time preventing any dressings from staying too moist right on the surface of the wounds. Obviously I think this can do a good job in helping to dry things out but I would recommend that we give this a shot. 07/09/2019 upon evaluation today patient appears to be doing better in regard to some of the edema and weeping in regard to the left lower extremity. Unfortunately he has not been wearing his compression of the right lower extremity and now that is open as well. He needs to obviously be wearing this although for today we will get a half to wrap him due to the fact that this is now open. I explained that he does need to be wearing his compression at all times. 07/16/2019 upon evaluation today patient actually does appear to be making some progress in regard to his lower extremity ulcers at this point. In fact he really does not have any ulcerations as  much as he just does have lymphedema breakdown causing weeping. Nonetheless he is drying up quite a bit but overall seems to be doing much better. He has a lot of dry flaky skin I think we may want to add triamcinolone to the skin to try to help out with cutting back on some of the irritation here hopefully improve the overall skin quality. He does have Velcro compression wraps once he heals that is good news as well. 07/23/2019 upon evaluation today patient appears to be doing better with regard to his bilateral lower extremities. I think we are getting closer to getting everything sealed up. Again he does not have any significant wounds and his lymphedema has dramatically improved. 07/30/2019 upon  evaluation today patient appears to be doing excellent in regard to his bilateral lower extremities. Fortunately there is no signs of active infection at this time. Overall the patient seems to be in fact completely sealed up as far as any areas of weeping or concern which also has me thrilled based on what we are seeing currently. Overall I think that he is doing extremely well. 09/03/2019 upon evaluation today patient appears to be doing somewhat poorly in regard to his left lower extremity with regard to open wounds. Fortunately there is nothing that appears to be draining too terribly although he has about 3 different areas that are open as far as wounds are concerned all 3 seem to be fairly superficial. He was discharged from our clinic on 07/30/2019. Subsequently he reopened in the interim apparently at home health never completely discharged home from being seen. They in fact have still continue to wrap his left leg up until now. 09/10/2019 upon evaluation today patient actually appears to be doing better with regard to his lower extremities. Fortunately there is no signs of active infection at this time. No fevers, chills, nausea, vomiting, or diarrhea. Overall been fairly pleased with how things seem to be progressing at this point. The patient is likewise happy that his legs look better. He tells me he has been elevating more. With that being said he does not really commit to exactly how much more and his wife tells me he still spends a lot of time with his feet on the ground. Electronic Signature(s) Signed: 09/10/2019 5:04:18 PM By: Worthy Keeler PA-C Entered By: Worthy Keeler on 09/10/2019 17:04:18 Steven Meza ([id number]) -------------------------------------------------------------------------------- Physical Exam Details Patient Name: Steven Meza Date of Service: 09/10/2019 3:15 PM Medical Record Number: [id number] Patient Account Number: [id number] Date of Birth/Sex:  February 15, 1931 (86 y.o. M) Treating RN: Army Melia Primary Care Provider: Fulton Reek Other Clinician: Referring Provider: Fulton Reek Treating Provider/Extender: Melburn Hake, Melburn Treiber Weeks in Treatment: 41 Constitutional Well-nourished and well-hydrated in no acute distress. Respiratory normal breathing without difficulty. Psychiatric this patient is able to make decisions and demonstrates good insight into disease process. Alert and Oriented x 3. pleasant and cooperative. Notes Upon inspection patient's legs do appear to be much drier and I do feel like he is making some progress here at this point. Fortunately there is no evidence of active infection at this time. Overall very pleased with how things seem to be progressing. Electronic Signature(s) Signed: 09/10/2019 5:04:41 PM By: Worthy Keeler PA-C Entered By: Worthy Keeler on 09/10/2019 17:04:41 Steven Meza ([id number]) -------------------------------------------------------------------------------- Physician Orders Details Patient Name: Steven Meza Date of Service: 09/10/2019 3:15 PM Medical Record Number: [id number] Patient Account Number: [id number] Date of Birth/Sex: [birth year] (86 y.o. M)  Treating RN: Army Melia Primary Care Provider: Fulton Reek Other Clinician: Referring Provider: Fulton Reek Treating Provider/Extender: Melburn Hake, Hollyann Pablo Weeks in Treatment: 69 Verbal / Phone Orders: No Diagnosis Coding ICD-10 Coding Meza Description I89.0 Lymphedema, not elsewhere classified I87.2 Venous insufficiency (chronic) (peripheral) L97.822 Non-pressure chronic ulcer of other part of left lower leg with fat layer exposed I10 Essential (primary) hypertension I25.10 Atherosclerotic heart disease of native coronary artery without angina pectoris L84 Corns and callosities Wound Cleansing Wound #10 Left,Distal,Medial Lower Leg o Clean wound with Normal Saline. o May shower with protection. - Do not get  wrap wet Wound #8 Left,Anterior Lower Leg o Clean wound with Normal Saline. o May shower with protection. - Do not get wrap wet Wound #9 Left,Proximal,Medial Lower Leg o Clean wound with Normal Saline. o May shower with protection. - Do not get wrap wet Primary Wound Dressing Wound #10 Left,Distal,Medial Lower Leg o Silver Alginate Wound #8 Left,Anterior Lower Leg o Silver Alginate Wound #9 Left,Proximal,Medial Lower Leg o Silver Alginate Secondary Dressing Wound #10 Left,Distal,Medial Lower Leg o ABD pad Wound #8 Left,Anterior Lower Leg o ABD pad Wound #9 Left,Proximal,Medial Lower Leg o ABD pad Dressing Change Frequency Wound #10 Left,Distal,Medial Lower Leg o Dressing is to be changed Monday and Thursday. - Thursday by [facility] Wound #8 Left,Anterior Lower Leg o Dressing is to be changed Monday and Thursday. - Thursday by [facility] Wound #9 Left,Proximal,Medial Lower Leg o Dressing is to be changed Monday and Thursday. - Thursday by [facility] Follow-up Appointments Wound #10 Left,Distal,Medial Lower Leg Steven Meza, Steven Meza ([id number]) o Return Appointment in 1 week. Wound #8 Left,Anterior Lower Leg o Return Appointment in 1 week. Wound #9 Left,Proximal,Medial Lower Leg o Return Appointment in 1 week. Edema Control Wound #10 Left,Distal,Medial Lower Leg o 3 Layer Compression System - Left Lower Extremity o Elevate legs to the level of the heart and pump ankles as often as possible Wound #8 Left,Anterior Lower Leg o 3 Layer Compression System - Left Lower Extremity o Elevate legs to the level of the heart and pump ankles as often as possible Wound #9 Left,Proximal,Medial Lower Leg o 3 Layer Compression System - Left Lower Extremity o Elevate legs to the level of the heart and pump ankles as often as possible Home Health Wound #10 Left,Distal,Medial Lower Leg o [place] Visits o Home Health Nurse may visit PRN to address patientos  wound care needs. o FACE TO FACE ENCOUNTER: MEDICARE and MEDICAID PATIENTS: I certify that this patient is under my care and that I had a face-to-face encounter that meets the physician face-to-face encounter requirements with this patient on this date. The encounter with the patient was in whole or in part for the following MEDICAL CONDITION: (primary reason for [place]) MEDICAL NECESSITY: I certify, that based on my findings, NURSING services are a medically necessary home health service. HOME BOUND STATUS: I certify that my clinical findings support that this patient is homebound (i.e., Due to illness or injury, pt requires aid of supportive devices such as crutches, cane, wheelchairs, walkers, the use of special transportation or the assistance of another person to leave their place of residence. There is a normal inability to leave the home and doing so requires considerable and taxing effort. Other absences are for medical reasons / religious services and are infrequent or of short duration when for other reasons). o If current dressing causes regression in wound condition, may D/C ordered dressing product/s and apply Normal Saline Moist Dressing daily  until next [place] / Other MD appointment. [place] of regression in wound condition at [phone]. o Please direct any NON-WOUND related issues/requests for orders to patient's Primary Care Physician Wound #8 [place] Nurse may visit PRN to address patientos wound care needs. o FACE TO FACE ENCOUNTER: MEDICARE and MEDICAID PATIENTS: I certify that this patient is under my care and that I had a face-to-face encounter that meets the physician face-to-face encounter requirements with this patient on this date. The encounter with the patient was in whole or in part for the following MEDICAL CONDITION: (primary reason for  [place]) MEDICAL NECESSITY: I certify, that based on my findings, NURSING services are a medically necessary home health service. HOME BOUND STATUS: I certify that my clinical findings support that this patient is homebound (i.e., Due to illness or injury, pt requires aid of supportive devices such as crutches, cane, wheelchairs, walkers, the use of special transportation or the assistance of another person to leave their place of residence. There is a normal inability to leave the home and doing so requires considerable and taxing effort. Other absences are for medical reasons / religious services and are infrequent or of short duration when for other reasons). o If current dressing causes regression in wound condition, may D/C ordered dressing product/s and apply Normal Saline Moist Dressing daily until next [place] / Other MD appointment. [place] of regression in wound condition at [phone]. o Please direct any NON-WOUND related issues/requests for orders to patient's Primary Care Physician Wound #9 Left,Proximal,Medial Lower Leg o [place] Visits o Home Health Nurse may visit PRN to address patientos wound care needs. o FACE TO FACE ENCOUNTER: MEDICARE and MEDICAID PATIENTS: I certify that this patient is under my care and that I had a face-to-face encounter that meets the physician face-to-face encounter requirements with this patient on this date. The encounter with the patient was in whole or in part for the following MEDICAL CONDITION: (primary reason for [place]) MEDICAL NECESSITY: I certify, that based on my findings, NURSING services are a medically necessary home health service. HOME BOUND STATUS: I certify that my clinical findings support that this patient is homebound (i.e., Due to illness or injury, pt requires aid of supportive devices such as crutches, cane, wheelchairs, walkers, the use of  special transportation or the assistance of another person to leave their place of residence. There is a normal inability to leave the home and doing so requires considerable and taxing effort. Other absences are for medical reasons / religious services and are infrequent or of short duration when for other reasons). o If current dressing causes regression in wound condition, may D/C ordered dressing product/s and apply Normal Saline Moist Dressing daily until next [place] / Other MD appointment. [place] of regression in wound condition at [phone]. o Please direct any NON-WOUND related issues/requests for orders to patient's Primary Care Physician Electronic Signature(s) NYRON, CHMURA ([id number]) Signed: 09/10/2019 4:16:16 PM By: Army Melia Signed: 09/11/2019 11:00:46 AM By: Worthy Keeler PA-C Entered By: Army Melia on 09/10/2019 16:06:44 Steven Meza ([id number]) -------------------------------------------------------------------------------- Problem List Details Patient Name: Steven Meza Date of Service: 09/10/2019 3:15 PM Medical Record Number: [id number] Patient Account Number: [id number] Date of Birth/Sex: 1931/01/12 (86 y.o. M) Treating RN: Army Melia Primary Care Provider: Fulton Reek Other Clinician: Referring Provider: Fulton Reek Treating Provider/Extender:  Steven Meza, Steven Meza Weeks in Treatment: 23 Active Problems ICD-10 Encounter Meza Description Active Date MDM Diagnosis I89.0 Lymphedema, not elsewhere classified 04/02/2019 No Yes I87.2 Venous insufficiency (chronic) (peripheral) 04/02/2019 No Yes L97.822 Non-pressure chronic ulcer of other part of left lower leg with fat layer 04/02/2019 No Yes exposed I10 Essential (primary) hypertension 04/02/2019 No Yes I25.10 Atherosclerotic heart disease of native coronary artery without angina 04/02/2019 No Yes pectoris L84 Corns and callosities 05/09/2019 No  Yes Inactive Problems Resolved Problems Electronic Signature(s) Signed: 09/10/2019 3:54:14 PM By: Worthy Keeler PA-C Entered By: Worthy Keeler on 09/10/2019 15:54:14 Steven Meza ([id number]) -------------------------------------------------------------------------------- Progress Note Details Patient Name: Steven Meza Date of Service: 09/10/2019 3:15 PM Medical Record Number: [id number] Patient Account Number: [id number] Date of Birth/Sex: 1931/02/07 (86 y.o. M) Treating RN: Army Melia Primary Care Provider: Fulton Reek Other Clinician: Referring Provider: Fulton Reek Treating Provider/Extender: Melburn Hake, Sonnia Strong Weeks in Treatment: 53 Subjective Chief Complaint Information obtained from Patient Left LE Ulcer History of Present Illness (HPI) ADMISSION 05/17/2018 Mr. Zawadzki is an 86 year old man who is been treated at the lymphedema clinic for a long period with lymphedema wraps for bilateral lower extremity lymphedema. About 6 months ago they noted small open areas on his left lateral calf just above the ankle. These were open. They are apparently putting some form of ointment on this. They have been referred here for our review of this. The patient has a long history of lymphedema and venous stasis. It says in his records that he has recurrent cellulitis although his wife denies this but she states that the lymphedema may have started with cellulitis several years ago. Also in his record there is a history of bladder cancer which they seem to know very little about however he did not have any radiation to the pelvis or anything that could have contributed to lymphedema that they are aware of. There is no prior wound history. The patient has 2 small punched out areas on the left lateral calf that have adherent debris on the surface. Past medical history; lymphedema, hypertension, cellulitis, hearing loss, morbid obesity, osteoarthritis, venous stasis, bladder CA,  diverticulosis. ABIs in our clinic were 0.36 on the right and 0.57 on the left 05/24/2018; corrected age on this patient is 48 versus what I said last week. He has been for his arterial studies which predictably are not very good. On the right his ABI is 0.65. Monophasic waveforms noted at the right ankle including the posterior tibial and dorsalis pedis. He has triphasic waveforms of the common femoral and profunda femoris triphasic proximal SFA with monophasic distal FSA and popliteal artery. Monophasic tibial waveforms. On the left his ABI is 0.58 monophasic waveforms at the left ankle. Duplex of the left lower extremity demonstrated atherosclerotic change with monophasic waveforms throughout the left lower extremity. There was some concern about left iliac disease and potentially femoral-popliteal and tibial disease. The patient does not describe claudication although according to his wife he over emphasizes his activity. Patient states he is limited by pain in both knees His wounds are on the left lateral calf. 2 small areas with necrotic debris. We have been using silver collagen and light Ace wraps 05/31/2018; wounds on the left lateral calf in the setting of very significant lymphedema and probably PAD. We have been using silver collagen. The base of the small wound looks reasonably improved. I sent him to see Dr. Fletcher Anon however I see now he has an appointment with Dr. Alvester Chou  on February 12 2/5; left lateral calf wound looks the same. His wife is doing a good job with her lymphedema wraps and maintaining that the edema. He most likely has PAD and is due to see Dr. Gwenlyn Found of infectious disease on February 12 2/19; left lateral calf wounds look about the same. This looks like wound secondary to chronic venous insufficiency with lymphedema however the patient has very poor arterial studies. We referred him to Dr. Gwenlyn Found. Dr. Gwenlyn Found did not feel he needed to do anything from an arterial point of  view. He did not address the question about the aggressiveness of compression. After some thoughts about this I elected to go ahead and put him in 3 layer compression which after all would be less compression then the lymphedema clinic was putting on this. I am hopeful that this should be enough to get some closure of the small wounds 2/26; left lateral calf wound letter this week. He tolerated the 3 layer compression well furthermore he is sleeping in a hospital bed which helps keep his legs up at night. The wound looks better measuring smaller especially in width 3/3; left lateral calf wound about the same size. The 3 layer compression has really helped get the edema down in his leg however. Using silver collagen 3/11; left lateral calf wound better looking wound surface but about the same size. We have been using 3 layer compression which is really helped get the edema down in the left leg. Even after Dr. Kennon Holter assessment I am reluctant to go to 4 layer compression. He is tolerating 3 layer compression well. We have been using silver collagen 3/18-Patient returns for his left lateral calf wound which overall is looking better, slightly increased in size and dimensions and area, he is tolerating the 3 layer compression, has been dressed with silver collagen which we will continue 3/25 left lateral calf wound much better looking. Size is smaller. He has been using silver collagen 4/1; gradually getting smaller in size. Using silver collagen with 3 layer compression. I think the patient has some degree of PAD. He saw Dr. Gwenlyn Found in consultation, he did not specifically comment on whether he could tolerate 4 layer compression 4/8; the wound is slightly smaller in depth. We are using 3 layer compression with silver collagen. I had him seen for arterial insufficiency however his ABI was 0.57 in the left leg. Dr. Gwenlyn Found really did not seem to middle on the degree of compression he could tolerate 4/15;  the wound is slightly smaller and slightly less deep. We have been using 3 layer compression with silver collagen. There is not an option for 4-layer compression here. 4/22; the patient is making decent progress on his wound on the left lateral calf however he arrives in with a superficial wound on the right anterior tibial area. Were not really sure how this happened. He had been using a stocking on the right leg. He has been using silver collagen on the wounds Amazing to me he is he appears to have compression pumps at home. I am not really sure I knew this before. In any case he has not been using them 4/29; left lateral calf wound continues to get gradually smaller. This looks like it is on its way to closure. The area on the right anterior tibial area is about the same in terms of size superficial without any depth. This was probably wrap injury He tells me that he has been using his compression pumps once  a day for 1 hour without any pain Steven Meza, Steven Meza ([id number]) 5/6; not as good today. Left lateral calf wound is actually larger and he does not have quite as good edema control. The area on the right anterior tibia is about the same still superficial. He states he is using his compression pumps once a day. We are using 3 layer compression on the left and Curlex Coban on the right 5/13; the area on the right is very close to being healed. The area on the left still has some depth but has a healthy wound surface. He has excellent edema control bilaterally. Using Hydrofera Blue 5/20-Patient returns in 1 week after being in 3 layer compression on the left, the left lateral calf ulcer is stable with some slough, the right anterior tibial ulcer is healed we have been using Kerlix Coban on that leg 5/27; Hydrofera Blue and 3 layer compression. Still some slough in the wound area and some nonviable edges around the wound. 6/3; this patient has lymphedema and chronic venous insufficiency. He had  difficult to close wound on his left lateral calf. This is closed today. 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 Finally he has known PAD but he is managed to tolerate the compression we reporting on him and also using his compression pumps that he already had at home twice a day 6/10; we discharge this patient last week having finally close the small area in the left lateral calf. Apparently by Friday of last week this had opened and was draining. He is therefore back in clinic. His wife who is present also was concerned about an area on the right anterior tibia."o 6/17; the patient does not have an open area on the right leg. On the left he has a small circular area anteriorly and the same area laterally that he had last time. However both of these appear to be improved he has his [place] wrap 30/40. It turns out that he is only using his external compression pumps once a day. I tried to get him up to twice a day today 6/24; the patient comes in with his external compression garment on the right leg/[place] wrap. As far as we know no open wounds here. He has 2 areas that are smaller one on the left anterior pretibial and one on the left lateral calf which is the site of his initial wounds. I have successfully caught him into his external compression pumps twice a day 7/1; still using his external compression garment on the right leg. As far as we know there are no open wounds here. The area on the left lateral calf which was the site of his initial wound has closed over. The area on the left anterior pretibial that we identified last week is still open. 7/8; as far as we know he has no open wounds on the right leg. He has his external compression garment on here. The original wound on the left lateral calf has closed over. He developed an area on the left anterior pretibial 2 weeks ago and seems to have 2 smaller areas on the proximal and distal lateral calf.  We do not have his good edema control this week as I am used to seeing 7/15; the patient has still a linear area on the left anterior tibia. The area on the left lateral leg looked somewhat inflamed today but no cellulitis. He did have what looked to be a small pustule that  I cultured for CandS although I did not give him empiric antibiotics. 7/22; small area on the left anterior tibia. The area on the left lateral leg still looks inflamed. The pustule that I cultured last week grew Enterobacter and Pseudomonas. I prescribed Cipro they still have not picked that up. In any case the area actually looks fairly satisfactory. He has decent edema control using his compression pumps twice a day 7/29; the area on the left lateral leg looks a lot better than last week. Nothing is open here. He has an area just medial to the mid anterior tibia that is still open that is only open wound today. 8/5-Patient returns at 1 week for the left leg ulcer, which is actually looking better, he is also complaining of left plantar foot pain below the great toe. He does have a hematoma here 8/12-Patient returns at 1 week, the left leg open area looks about the same if not slightly better, the left plantar hematoma under the great toe is about the same, patient apparently has not been resting his foot on anything for too long period of time and does not wear the [place] shoes unless he walks outdoors which he does not do often 8/19; small wound on the left anterior lower leg this is just about closed there is nothing on the left lateral leg. He is using his compression pumps He arrives in clinic today with a black eschar over the right first plantar metatarsal head. This was apparently a bruise that was identified by her nurses last week. He wears crocs on his feet. He also may rub these on a footboard at home 8/26; there is nothing open on the left lateral lower leg he has a small area just medial to the tibia. He is  going to see his podiatrist Dr. Milinda Pointer about the area on the left plantar first metatarsal head. He is using his external compression pumps. He has a juxta lite on the right leg 9/2; there is nothing open on either side of her left lower leg. He went to see podiatry about the area on the left first metatarsal head. This was apparently debrided. He was given mupirocin and oral antibiotics. I do not believe he is offloading this at all. Readmission: 04/02/2019 upon evaluation today patient actually appears for reevaluation here in our clinic secondary to issues that he is having with his left lower extremity. He has been tolerating the dressing changes without complication. Fortunately there is no signs of active infection at this time. No fevers, chills, nausea, vomiting, or diarrhea. He has been in the hospital since we last saw him and then subsequently transferred back to [facility]. He tells me that they have been changing his dressing once a day and that he has had a significant amount of pain secondary to undergoing these dressing changes. Fortunately there is no evidence of active infection at this point which is good news. No fevers, chills, nausea, vomiting, or diarrhea. With that being said he wonders if we can go back to doing what we were doing before which was when he actually had the dressing changes performed once a week here in the clinic he tells me that he did very well with that. At that point he tolerated a 3 layer compression wrap which was approved by vascular, Dr. Tyrell Antonio office, to be used previous although 4-layer compression wrap is probably too much for him. 04/16/2019 on evaluation today patient appears to be doing decently well with regard  to his lower extremity although he has had a lot of drainage from his leg. A lot of this I think is just secondary to the lymphedema with that being said he does have some odor as well which may again just be due to more of the  excessive moisture although I think that he may also have something that is a result of bacteria again he has somewhat of a Pseudomonas-like smell to the drainage and fortunately though he is not showing any signs of severe cellulitis he is still experiencing quite a bit of drainage and the discoloration is somewhat of a yellow/green in color which is consistent with Pseudomonas. If he can tolerated I think he may do well with Cipro. 04/24/2019 on evaluation today patient appears to be doing well with regard to his wound in the gluteal region which is healing quite well. That is excellent news. With that being said he is also doing better with regard to his left lower extremity overall I feel like things are definitely showing signs of improvement. 12/28-Patient returns with regards to his gluteal wound that is improving, Left lower leg wounds with significant edema in the lower legs that appear to be persistent. Patient is in compression and has lymphedema pumps at home but apparently he does not use consistently 05/07/2019 Upon inspection patient's wound bed actually showed signs on evaluation today patient's wound currently showed signs of improvement. There is still a lot of maceration but it does look like that there is been some of this maceration occurring as a result of drainage in particular and some as a result of the fact that they have been putting bag balm on the wound area as well as the leg in general. I think this is applying too much moisture and not doing him any good. I discussed with the patient and his wife today that I think that practice needs to be adjusted I would recommend using a different type of lotion in order to help with moisture around the wound but I would not recommend anything directly on the wound itself. 05/14/2019 on evaluation today patient actually appears to be doing much better in regard to his lower extremity. They have not been using any of Steven Meza, Steven A.  ([id number]) the bag balm on the area I think this has made a tremendous improvement overall in his wounds. He has a lot of new skin growth and though there still are a lot of open wounds scattered throughout the circumferential lower leg this overall seems to be doing much better to me. 05/21/2019 upon evaluation today patient appears to be doing well in regard to his lower extremity although he is having unfortunately some blue- green drainage noted at this point. The does have me concerned for a reinitiation of the Pseudomonas infection that we previously treated in December. The last time I gave him a prescription was April 16, 2019. That was a little over a month ago. Nonetheless he does have more green drainage noted today and I think this is indicative of potentially a revving up infection. Obviously we do not want to get to that point. 05/28/2019 on evaluation today patient appears to be doing better with regard to his lower extremity at this point. Overall I am seeing signs of improvement which is good news. Fortunately there is no evidence of active infection at this time. I do believe that the Cipro has been beneficial for him that was prescribed last week for 14 days he is  halfway through that and already appears to be doing much better. This is excellent news. 06/04/2019 upon evaluation today I do believe that the patient is doing better with regard to his lower extremity. I feel like that he has much less in the way of open wounds and drainage and much in provement in regard to the overall weeping and even his swelling. There is no signs of erythema no obvious evidence of active infection all of which is great news. No fevers, chills, nausea, vomiting, or diarrhea. 06/11/2019 on evaluation today patient appears to be doing better with regard to his lower extremity on the left. He has been tolerating dressing changes without complication. Fortunately there is no signs of active infection at  this time. No fevers, chills, nausea, vomiting, or diarrhea. 06/18/2019 upon evaluation today patient appears to be doing slightly worse compared to last week. They did call me on Friday due to the fact that he had green discharge and both his wife as well as his home health nurse were concerned about what they were seeing. Subsequently I did actually send in a prescription for Cipro for 14 days for him and according to his wife this seems to be doing better based on what she is seeing today compared to Friday. This is great news. Home health is still coming out to see him on Wednesdays and Fridays as well. 06/25/2019 upon evaluation today patient appears to be doing somewhat better in regard to his leg ulcer. Fortunately there is no signs of active infection at this time which is good news. With that being said I am very pleased with how things seem to be going and overall I see no major issues here I think the alginate is doing well along with the XtraSorb. With regard to the patient's plantar foot he does have somewhat of a blood blister that appears to be resolving at this point his wife states that she feels like the issue is that he is not dropping his shoe in order to keep it from sliding when he walks. Subsequently this is leading to increases in friction which of course is leading to more issues with getting this area to heal the patient is struggling in that regard. 07/02/2019 upon evaluation today patient appears to be making some progress in regard to his lower extremities. With that being said I really feel like the alginate may be getting too moist and then sitting on the skin causing some complications here for [place]. I discussed with the patient and his wife today as well the possibility of going ahead and discontinuing the alginate just utilizing the Xtrasorb to try to keep the edema under better control while at the same time preventing any dressings from staying too moist right on the  surface of the wounds. Obviously I think this can do a good job in helping to dry things out but I would recommend that we give this a shot. 07/09/2019 upon evaluation today patient appears to be doing better in regard to some of the edema and weeping in regard to the left lower extremity. Unfortunately he has not been wearing his compression of the right lower extremity and now that is open as well. He needs to obviously be wearing this although for today we will get a half to wrap him due to the fact that this is now open. I explained that he does need to be wearing his compression at all times. 07/16/2019 upon evaluation today patient actually does appear  to be making some progress in regard to his lower extremity ulcers at this point. In fact he really does not have any ulcerations as much as he just does have lymphedema breakdown causing weeping. Nonetheless he is drying up quite a bit but overall seems to be doing much better. He has a lot of dry flaky skin I think we may want to add triamcinolone to the skin to try to help out with cutting back on some of the irritation here hopefully improve the overall skin quality. He does have Velcro compression wraps once he heals that is good news as well. 07/23/2019 upon evaluation today patient appears to be doing better with regard to his bilateral lower extremities. I think we are getting closer to getting everything sealed up. Again he does not have any significant wounds and his lymphedema has dramatically improved. 07/30/2019 upon evaluation today patient appears to be doing excellent in regard to his bilateral lower extremities. Fortunately there is no signs of active infection at this time. Overall the patient seems to be in fact completely sealed up as far as any areas of weeping or concern which also has me thrilled based on what we are seeing currently. Overall I think that he is doing extremely well. 09/03/2019 upon evaluation today patient appears  to be doing somewhat poorly in regard to his left lower extremity with regard to open wounds. Fortunately there is nothing that appears to be draining too terribly although he has about 3 different areas that are open as far as wounds are concerned all 3 seem to be fairly superficial. He was discharged from our clinic on 07/30/2019. Subsequently he reopened in the interim apparently at home health never completely discharged home from being seen. They in fact have still continue to wrap his left leg up until now. 09/10/2019 upon evaluation today patient actually appears to be doing better with regard to his lower extremities. Fortunately there is no signs of active infection at this time. No fevers, chills, nausea, vomiting, or diarrhea. Overall been fairly pleased with how things seem to be progressing at this point. The patient is likewise happy that his legs look better. He tells me he has been elevating more. With that being said he does not really commit to exactly how much more and his wife tells me he still spends a lot of time with his feet on the ground. Objective Constitutional Well-nourished and well-hydrated in no acute distress. Steven Meza, Steven Meza ([id number]) Vitals Time Taken: 3:25 PM, Height: 65 in, Weight: 273 lbs, BMI: 45.4, Temperature: 98.4 F, Pulse: 71 bpm, Respiratory Rate: 18 breaths/min, Blood Pressure: 121/70 mmHg. Respiratory normal breathing without difficulty. Psychiatric this patient is able to make decisions and demonstrates good insight into disease process. Alert and Oriented x 3. pleasant and cooperative. General Notes: Upon inspection patient's legs do appear to be much drier and I do feel like he is making some progress here at this point. Fortunately there is no evidence of active infection at this time. Overall very pleased with how things seem to be progressing. Integumentary (Hair, Skin) Wound #10 status is Open. Original cause of wound was Gradually  Appeared. The wound is located on the Left,Distal,Medial Lower Leg. The wound measures 0.3cm length x 0.3cm width x 0.1cm depth; 0.071cm^2 area and 0.007cm^3 volume. There is Fat Layer (Subcutaneous Tissue) Exposed exposed. There is a medium amount of serous drainage noted. The wound margin is flat and intact. There is large (67-100%) pink granulation within the  wound bed. There is a small (1-33%) amount of necrotic tissue within the wound bed including Adherent Slough. Wound #8 status is Open. Original cause of wound was Gradually Appeared. The wound is located on the Left,Anterior Lower Leg. The wound measures 0.1cm length x 0.1cm width x 0.1cm depth; 0.008cm^2 area and 0.001cm^3 volume. There is Fat Layer (Subcutaneous Tissue) Exposed exposed. There is a medium amount of serous drainage noted. The wound margin is flat and intact. There is large (67-100%) pink granulation within the wound bed. There is no necrotic tissue within the wound bed. Wound #9 status is Open. Original cause of wound was Gradually Appeared. The wound is located on the Left,Proximal,Medial Lower Leg. The wound measures 0.5cm length x 0.5cm width x 0.1cm depth; 0.196cm^2 area and 0.02cm^3 volume. There is Fat Layer (Subcutaneous Tissue) Exposed exposed. There is a medium amount of serous drainage noted. The wound margin is flat and intact. There is large (67-100%) pink granulation within the wound bed. There is a small (1-33%) amount of necrotic tissue within the wound bed including Adherent Slough. Assessment Active Problems ICD-10 Lymphedema, not elsewhere classified Venous insufficiency (chronic) (peripheral) Non-pressure chronic ulcer of other part of left lower leg with fat layer exposed Essential (primary) hypertension Atherosclerotic heart disease of native coronary artery without angina pectoris Corns and callosities Procedures Wound #10 Pre-procedure diagnosis of Wound #10 is a Lymphedema located on the  Left,Distal,Medial Lower Leg . There was a Three Layer Compression Therapy Procedure by Army Melia, RN. Post procedure Diagnosis Wound #10: Same as Pre-Procedure Wound #8 Pre-procedure diagnosis of Wound #8 is a Lymphedema located on the Left,Anterior Lower Leg . There was a Three Layer Compression Therapy Procedure by Army Melia, RN. Post procedure Diagnosis Wound #8: Same as Pre-Procedure Wound #9 Pre-procedure diagnosis of Wound #9 is a Lymphedema located on the Left,Proximal,Medial Lower Leg . There was a Three Layer Compression Therapy Procedure by Army Melia, RN. Post procedure Diagnosis Wound #9: Same as Pre-Procedure Plan Steven Meza, Steven Meza ([id number]) Wound Cleansing: Wound #10 Left,Distal,Medial Lower Leg: Clean wound with Normal Saline. May shower with protection. - Do not get wrap wet Wound #8 Left,Anterior Lower Leg: Clean wound with Normal Saline. May shower with protection. - Do not get wrap wet Wound #9 Left,Proximal,Medial Lower Leg: Clean wound with Normal Saline. May shower with protection. - Do not get wrap wet Primary Wound Dressing: Wound #10 Left,Distal,Medial Lower Leg: Silver Alginate Wound #8 Left,Anterior Lower Leg: Silver Alginate Wound #9 Left,Proximal,Medial Lower Leg: Silver Alginate Secondary Dressing: Wound #10 Left,Distal,Medial Lower Leg: ABD pad Wound #8 Left,Anterior Lower Leg: ABD pad Wound #9 Left,Proximal,Medial Lower Leg: ABD pad Dressing Change Frequency: Wound #10 Left,Distal,Medial Lower Leg: Dressing is to be changed Monday and Thursday. - Thursday by [facility] Wound #8 Left,Anterior Lower Leg: Dressing is to be changed Monday and Thursday. - Thursday by [facility] Wound #9 Left,Proximal,Medial Lower Leg: Dressing is to be changed Monday and Thursday. - Thursday by [facility] Follow-up Appointments: Wound #10 Left,Distal,Medial Lower Leg: Return Appointment in 1 week. Wound #8 Left,Anterior Lower Leg: Return Appointment in 1 week. Wound #9  Left,Proximal,Medial Lower Leg: Return Appointment in 1 week. Edema Control: Wound #10 Left,Distal,Medial Lower Leg: 3 Layer Compression System - Left Lower Extremity Elevate legs to the level of the heart and pump ankles as often as possible Wound #8 Left,Anterior Lower Leg: 3 Layer Compression System - Left Lower Extremity Elevate legs to the level of the heart and pump ankles as often as possible Wound #  9 Left,Proximal,Medial Lower Leg: 3 Layer Compression System - Left Lower Extremity Elevate legs to the level of the heart and pump ankles as often as possible Home Health: Wound #10 Left,Distal,Medial Lower Leg: [place] Nurse may visit PRN to address patient s wound care needs. FACE TO FACE ENCOUNTER: MEDICARE and MEDICAID PATIENTS: I certify that this patient is under my care and that I had a face-to-face encounter that meets the physician face-to-face encounter requirements with this patient on this date. The encounter with the patient was in whole or in part for the following MEDICAL CONDITION: (primary reason for [place]) MEDICAL NECESSITY: I certify, that based on my findings, NURSING services are a medically necessary home health service. HOME BOUND STATUS: I certify that my clinical findings support that this patient is homebound (i.e., Due to illness or injury, pt requires aid of supportive devices such as crutches, cane, wheelchairs, walkers, the use of special transportation or the assistance of another person to leave their place of residence. There is a normal inability to leave the home and doing so requires considerable and taxing effort. Other absences are for medical reasons / religious services and are infrequent or of short duration when for other reasons). If current dressing causes regression in wound condition, may D/C ordered dressing product/s and apply Normal Saline Moist Dressing daily until next [place] / Other  MD appointment. [place] of regression in wound condition at [phone]. Please direct any NON-WOUND related issues/requests for orders to patient's Primary Care Physician Wound #8 Left,Anterior Lower Leg: [place] Nurse may visit PRN to address patient s wound care needs. FACE TO FACE ENCOUNTER: MEDICARE and MEDICAID PATIENTS: I certify that this patient is under my care and that I had a face-to-face encounter that meets the physician face-to-face encounter requirements with this patient on this date. The encounter with the patient was in whole or in part for the following MEDICAL CONDITION: (primary reason for [place]) MEDICAL NECESSITY: I certify, that based on my findings, NURSING services are a medically necessary home health service. HOME BOUND STATUS: I certify that my clinical findings support that this patient is homebound (i.e., Due to illness or injury, pt requires aid of supportive devices such as crutches, cane, wheelchairs, walkers, the use of special transportation or the assistance of another person to leave their place of residence. There is a normal inability to leave the home and doing so requires considerable and taxing effort. Other absences are for medical reasons / religious services and are infrequent or of short duration when for other reasons). If current dressing causes regression in wound condition, may D/C ordered dressing product/s and apply Normal Saline Moist Dressing daily until next [place] / Other MD appointment. [place] of regression in wound condition at [phone]. Please direct any NON-WOUND related issues/requests for orders to patient's Primary Care Physician Wound #9 Left,Proximal,Medial Lower Leg: [place] Visits Steven Meza, Steven Meza ([id number]) Home Health Nurse may visit PRN to address patient s wound care needs. FACE TO FACE ENCOUNTER: MEDICARE and  MEDICAID PATIENTS: I certify that this patient is under my care and that I had a face-to-face encounter that meets the physician face-to-face encounter requirements with this patient on this date. The encounter with the patient was in whole or in part for the following MEDICAL CONDITION: (primary reason for [place]) MEDICAL NECESSITY: I certify, that based on my findings,  NURSING services are a medically necessary home health service. HOME BOUND STATUS: I certify that my clinical findings support that this patient is homebound (i.e., Due to illness or injury, pt requires aid of supportive devices such as crutches, cane, wheelchairs, walkers, the use of special transportation or the assistance of another person to leave their place of residence. There is a normal inability to leave the home and doing so requires considerable and taxing effort. Other absences are for medical reasons / religious services and are infrequent or of short duration when for other reasons). If current dressing causes regression in wound condition, may D/C ordered dressing product/s and apply Normal Saline Moist Dressing daily until next [place] / Other MD appointment. [place] of regression in wound condition at [phone]. Please direct any NON-WOUND related issues/requests for orders to patient's Primary Care Physician 1. I would recommend currently that we continue with the wound care measures as before utilizing silver alginate dressing coupled with the compression wrap that seems to be doing a great job for him. 2. I am also going to suggest the patient needs to continue to elevate his legs is much as possible. Obviously the more he can keep his legs up and elevated the better off he will be. 3. I do suggest as well we continue to monitor for infection though I see no evidence of infection at this point. We will see patient back for reevaluation in 1 week here in the clinic. If  anything worsens or changes patient will contact our office for additional recommendations. Electronic Signature(s) Signed: 09/10/2019 5:06:12 PM By: Worthy Keeler PA-C Entered By: Worthy Keeler on 09/10/2019 17:06:11 Steven Meza ([id number]) -------------------------------------------------------------------------------- SuperBill Details Patient Name: Steven Meza Date of Service: 09/10/2019 Medical Record Number: [id number] Patient Account Number: [id number] Date of Birth/Sex: 22-May-1930 (86 y.o. M) Treating RN: Army Melia Primary Care Provider: Fulton Reek Other Clinician: Referring Provider: Fulton Reek Treating Provider/Extender: Melburn Hake, [place] Weeks in Treatment: 23 Diagnosis Coding ICD-10 Codes Meza Description I89.0 Lymphedema, not elsewhere classified I87.2 Venous insufficiency (chronic) (peripheral) L97.822 Non-pressure chronic ulcer of other part of left lower leg with fat layer exposed I10 Essential (primary) hypertension I25.10 Atherosclerotic heart disease of native coronary artery without angina pectoris L84 Corns and callosities Facility Procedures CPT4 Meza: [id number] Description: (Facility Use Only) [phone] - [place] LWR LT LEG Modifier: Quantity: 1 Physician Procedures CPT4 [id number] Description: [id number] - WC PHYS LEVEL 3 - EST PT Modifier: Quantity: 1 CPT4 Meza: Description: ICD-10 Diagnosis Description I89.0 Lymphedema, not elsewhere classified I87.2 Venous insufficiency (chronic) (peripheral) L97.822 Non-pressure chronic ulcer of other part of left lower leg with fat lay [place] (primary) hypertension Modifier: er exposed Quantity: Electronic Signature(s) Signed: 09/10/2019 5:08:30 PM By: Worthy Keeler PA-C Previous Signature: 09/10/2019 4:16:16 PM Version By: Army Melia Entered By: Worthy Keeler on 09/10/2019 17:08:28

## 2019-09-10 NOTE — Progress Notes (Signed)
He presents today with chief complaint of painfully elongated toenails.  Objective: Toenails are long thick yellow dystrophic-like mycotic left leg is wrapped from wound care center secondary to lymphedema and cellulitis.  Right leg demonstrates edema no cellulitis no open lesions or wounds no draining.  Assessment: Pain in limb secondary to onychomycosis.  Plan: Follow-up with him in about 4 months

## 2019-09-10 NOTE — Progress Notes (Signed)
IMRE, GUERRIERI ([id number]) Visit Report for 09/10/2019 Arrival Information Details Patient Name: Steven Meza, Steven Meza Date of Service: 09/10/2019 3:15 PM Medical Record Number: [id number] Patient Account Number: [id number] Date of Birth/Sex: 1930-07-01 (86 y.o. M) Treating RN: Army Melia Primary Care Benicia Bergevin: Fulton Reek Other Clinician: Referring Jenesis Suchy: Fulton Reek Treating Katelyn Broadnax/Extender: Melburn Hake, HOYT Weeks in Treatment: 23 Visit Information History Since Last Visit Added or deleted any medications: No Patient Arrived: Walker Has Dressing in Place as Prescribed: Yes Arrival Time: 15:25 Has Compression in Place as Prescribed: Yes Accompanied By: wife Pain Present Now: No Transfer Assistance: None Patient Identification Verified: Yes Secondary Verification Process Completed: Yes Electronic Signature(s) Signed: 09/10/2019 4:36:30 PM By: Lorine Bears RCP, RRT, CHT Entered By: Lorine Bears on 09/10/2019 15:26:49 Steven Meza ([id number]) -------------------------------------------------------------------------------- Compression Therapy Details Patient Name: Steven Meza Date of Service: 09/10/2019 3:15 PM Medical Record Number: [id number] Patient Account Number: [id number] Date of Birth/Sex: 14-Oct-1930 (86 y.o. M) Treating RN: Army Melia Primary Care Jannet Calip: Fulton Reek Other Clinician: Referring Steven Meza: Fulton Reek Treating Makeya Hilgert/Extender: Melburn Hake, HOYT Weeks in Treatment: 23 Compression Therapy Performed for Wound Assessment: Wound #10 Left,Distal,Medial Lower Leg Performed By: Clinician Army Melia, RN Compression Type: Three Layer Post Procedure Diagnosis Same as Pre-procedure Electronic Signature(s) Signed: 09/10/2019 4:16:16 PM By: Army Melia Entered By: Army Melia on 09/10/2019 16:05:50 Steven Meza  ([id number]) -------------------------------------------------------------------------------- Compression Therapy Details Patient Name: Steven Meza Date of Service: 09/10/2019 3:15 PM Medical Record Number: [id number] Patient Account Number: [id number] Date of Birth/Sex: Aug 01, 1930 (86 y.o. M) Treating RN: Army Melia Primary Care Celestine Prim: Fulton Reek Other Clinician: Referring Marlan Steward: Fulton Reek Treating Lyndy Russman/Extender: Melburn Hake, HOYT Weeks in Treatment: 23 Compression Therapy Performed for Wound Assessment: Wound #8 Left,Anterior Lower Leg Performed By: Clinician Army Melia, RN Compression Type: Three Layer Post Procedure Diagnosis Same as Pre-procedure Electronic Signature(s) Signed: 09/10/2019 4:16:16 PM By: Army Melia Entered By: Army Melia on 09/10/2019 16:05:50 Steven Meza ([id number]) -------------------------------------------------------------------------------- Compression Therapy Details Patient Name: Steven Meza Date of Service: 09/10/2019 3:15 PM Medical Record Number: [id number] Patient Account Number: [id number] Date of Birth/Sex: [birth year] (86 y.o. M) Treating RN: Army Melia Primary Care Steven Meza: Fulton Reek Other Clinician: Referring Steven Meza: Fulton Reek Treating Steven Meza/Extender: Melburn Hake, HOYT Weeks in Treatment: 23 Compression Therapy Performed for Wound Assessment: Wound #9 Left,Proximal,Medial Lower Leg Performed By: Clinician Army Melia, RN Compression Type: Three Layer Post Procedure Diagnosis Same as Pre-procedure Electronic Signature(s) Signed: 09/10/2019 4:16:16 PM By: Army Melia Entered By: Army Melia on 09/10/2019 16:05:50 Steven Meza ([id number]) -------------------------------------------------------------------------------- Encounter Discharge Information Details Patient Name: Steven Meza Date of Service: 09/10/2019 3:15 PM Medical Record Number: [id number] Patient Account Number:  [id number] Date of Birth/Sex: 17-Feb-1931 (86 y.o. M) Treating RN: Army Melia Primary Care Steven Meza: Fulton Reek Other Clinician: Referring Steven Meza: Fulton Reek Treating Steven Meza/Extender: Melburn Hake, HOYT Weeks in Treatment: 25 Encounter Discharge Information Items Discharge Condition: Stable Ambulatory Status: Ambulatory Discharge Destination: Home Transportation: Private Auto Accompanied By: wife Schedule Follow-up Appointment: Yes Clinical Summary of Care: Electronic Signature(s) Signed: 09/10/2019 4:16:16 PM By: Army Melia Entered By: Army Melia on 09/10/2019 16:08:03 Steven Meza ([id number]) -------------------------------------------------------------------------------- Lower Extremity Assessment Details Patient Name: Steven Meza Date of Service: 09/10/2019 3:15 PM Medical Record Number: [id number] Patient Account Number: [id number] Date of Birth/Sex: 08-28-1930 (86 y.o. M) Treating RN: Army Melia Primary Care Steven Meza: Fulton Reek Other Clinician: Referring Steven Meza: Fulton Reek Treating Steven Meza/Extender: Melburn Hake, HOYT Weeks in Treatment: 23 Edema  Assessment Assessed: [Left: No] [Right: No] Edema: [Left: Ye] [Right: s] Calf Left: Right: Point of Measurement: 33 cm From Medial Instep 43 cm cm Ankle Left: Right: Point of Measurement: 12 cm From Medial Instep 27 cm cm Vascular Assessment Pulses: Dorsalis Pedis Palpable: [Left:Yes] Electronic Signature(s) Signed: 09/10/2019 4:16:16 PM By: Army Melia Entered By: Army Melia on 09/10/2019 15:59:28 Steven Meza ([id number]) -------------------------------------------------------------------------------- Multi Wound Chart Details Patient Name: Steven Meza Date of Service: 09/10/2019 3:15 PM Medical Record Number: [id number] Patient Account Number: [id number] Date of Birth/Sex: Aug 05, 1930 (86 y.o. M) Treating RN: Army Melia Primary Care Steven Meza: Fulton Reek Other  Clinician: Referring Steven Meza: Fulton Reek Treating Steven Meza/Extender: Melburn Hake, HOYT Weeks in Treatment: 23 Vital Signs Height(in): 65 Pulse(bpm): 30 Weight(lbs): [id number] Blood Pressure(mmHg): 121/70 Body Mass Index(BMI): 45 Temperature(F): 98.4 Respiratory Rate(breaths/min): 18 Photos: Wound Location: Left, Distal, Medial Lower Leg Left, Anterior Lower Leg Left, Proximal, Medial Lower Leg Wounding Event: Gradually Appeared Gradually Appeared Gradually Appeared Primary Etiology: Lymphedema Lymphedema Lymphedema Comorbid History: Lymphedema, Coronary Artery Lymphedema, Coronary Artery Lymphedema, Coronary Artery Disease, Hypertension Disease, Hypertension Disease, Hypertension Date Acquired: 08/02/2019 08/02/2019 08/02/2019 Weeks of Treatment: 1 1 1  Wound Status: Open Open Open Measurements L x W x D (cm) 0.3x0.3x0.1 0.1x0.1x0.1 0.5x0.5x0.1 Area (cm) : 0.071 0.008 0.196 Volume (cm) : 0.007 0.001 0.02 % Reduction in Area: 81.20% 97.90% 72.30% % Reduction in Volume: 81.60% 97.40% 71.80% Classification: Full Thickness Without Exposed Full Thickness Without Exposed Full Thickness Without Exposed Support Structures Support Structures Support Structures Exudate Amount: Medium Medium Medium Exudate Type: Serous Serous Serous Exudate Color: amber amber amber Wound Margin: Flat and Intact Flat and Intact Flat and Intact Granulation Amount: Large (67-100%) Large (67-100%) Large (67-100%) Granulation Quality: Pink Pink Pink Necrotic Amount: Small (1-33%) None Present (0%) Small (1-33%) Exposed Structures: Fat Layer (Subcutaneous Tissue) Fat Layer (Subcutaneous Tissue) Fat Layer (Subcutaneous Tissue) Exposed: Yes Exposed: Yes Exposed: Yes Fascia: No Fascia: No Fascia: No Tendon: No Tendon: No Tendon: No Muscle: No Muscle: No Muscle: No Joint: No Joint: No Joint: No Bone: No Bone: No Bone: No Epithelialization: None None None Treatment Notes Electronic Signature(s) Signed:  09/10/2019 4:16:16 PM By: Army Melia Entered By: Army Melia on 09/10/2019 16:05:07 Steven Meza ([id number]) -------------------------------------------------------------------------------- Multi-Disciplinary Care Plan Details Patient Name: Steven Meza Date of Service: 09/10/2019 3:15 PM Medical Record Number: [id number] Patient Account Number: [id number] Date of Birth/Sex: 11-27-1930 (86 y.o. M) Treating RN: Army Melia Primary Care Lynelle Weiler: Fulton Reek Other Clinician: Referring Malarie Tappen: Fulton Reek Treating Taleia Sadowski/Extender: Melburn Hake, HOYT Weeks in Treatment: 51 Active Inactive Abuse / Safety / Falls / Self Care Management Nursing Diagnoses: Potential for falls Goals: Patient will remain injury free related to falls Date Initiated: 04/24/2019 Target Resolution Date: 07/14/2019 Goal Status: Active Interventions: Assess fall risk on admission and as needed Notes: Venous Leg Ulcer Nursing Diagnoses: Actual venous Insuffiency (use after diagnosis is confirmed) Goals: Patient will maintain optimal edema control Date Initiated: 04/24/2019 Target Resolution Date: 07/14/2019 Goal Status: Active Interventions: Compression as ordered Notes: Electronic Signature(s) Signed: 09/10/2019 4:16:16 PM By: Army Melia Entered By: Army Melia on 09/10/2019 15:59:37 Steven Meza ([id number]) -------------------------------------------------------------------------------- Pain Assessment Details Patient Name: Steven Meza Date of Service: 09/10/2019 3:15 PM Medical Record Number: [id number] Patient Account Number: [id number] Date of Birth/Sex: 1930-06-25 (86 y.o. M) Treating RN: Army Melia Primary Care Emrie Gayle: Fulton Reek Other Clinician: Referring Isao Seltzer: Fulton Reek Treating Jamir Rone/Extender: Melburn Hake, HOYT Weeks in Treatment: 80 Active Problems Location of Pain Severity  and Description of Pain Patient Has Paino No Site Locations Pain  Management and Medication Current Pain Management: Electronic Signature(s) Signed: 09/10/2019 4:16:16 PM By: Army Melia Entered By: Army Melia on 09/10/2019 15:57:47 Steven Meza ([id number]) -------------------------------------------------------------------------------- Patient/Caregiver Education Details Patient Name: Steven Meza Date of Service: 09/10/2019 3:15 PM Medical Record Number: [id number] Patient Account Number: [id number] Date of Birth/Gender: 10-Dec-1930 (86 y.o. M) Treating RN: Army Melia Primary Care Physician: Fulton Reek Other Clinician: Referring Physician: Fulton Reek Treating Physician/Extender: Sharalyn Ink in Treatment: 78 Education Assessment Education Provided To: Patient Education Topics Provided Wound/Skin Impairment: Handouts: Caring for Your Ulcer Methods: Demonstration, Explain/Verbal Responses: State content correctly Electronic Signature(s) Signed: 09/10/2019 4:16:16 PM By: Army Melia Entered By: Army Melia on 09/10/2019 16:07:27 Steven Meza ([id number]) -------------------------------------------------------------------------------- Wound Assessment Details Patient Name: Steven Meza Date of Service: 09/10/2019 3:15 PM Medical Record Number: [id number] Patient Account Number: [id number] Date of Birth/Sex: Feb 10, 1931 (86 y.o. M) Treating RN: Army Melia Primary Care Michae Grimley: Fulton Reek Other Clinician: Referring Nioka Thorington: Fulton Reek Treating Tevyn Codd/Extender: Melburn Hake, HOYT Weeks in Treatment: 23 Wound Status Wound Number: 10 Primary Etiology: Lymphedema Wound Location: Left, Distal, Medial Lower Leg Wound Status: Open Wounding Event: Gradually Appeared Comorbid Lymphedema, Coronary Artery Disease, History: Hypertension Date Acquired: 08/02/2019 Weeks Of Treatment: 1 Clustered Wound: No Photos Wound Measurements Length: (cm) 0.3 Width: (cm) 0.3 Depth: (cm) 0.1 Area: (cm)  0.071 Volume: (cm) 0.007 % Reduction in Area: 81.2% % Reduction in Volume: 81.6% Epithelialization: None Wound Description Classification: Full Thickness Without Exposed Support Struc Wound Margin: Flat and Intact Exudate Amount: Medium Exudate Type: Serous Exudate Color: amber tures Foul Odor After Cleansing: No Slough/Fibrino Yes Wound Bed Granulation Amount: Large (67-100%) Exposed Structure Granulation Quality: Pink Fascia Exposed: No Necrotic Amount: Small (1-33%) Fat Layer (Subcutaneous Tissue) Exposed: Yes Necrotic Quality: Adherent Slough Tendon Exposed: No Muscle Exposed: No Joint Exposed: No Bone Exposed: No Treatment Notes Wound #10 (Left, Distal, Medial Lower Leg) Notes scell, ABD, 3layer left Electronic Signature(s) Signed: 09/10/2019 4:16:16 PM By: Melene Plan ([id number]) Entered By: Army Melia on 09/10/2019 15:58:28 Steven Meza ([id number]) -------------------------------------------------------------------------------- Wound Assessment Details Patient Name: Steven Meza Date of Service: 09/10/2019 3:15 PM Medical Record Number: [id number] Patient Account Number: [id number] Date of Birth/Sex: 09/12/1930 (86 y.o. M) Treating RN: Army Melia Primary Care Makyla Bye: Fulton Reek Other Clinician: Referring Reese Stockman: Fulton Reek Treating Per Beagley/Extender: Melburn Hake, HOYT Weeks in Treatment: 23 Wound Status Wound Number: 8 Primary Etiology: Lymphedema Wound Location: Left, Anterior Lower Leg Wound Status: Open Wounding Event: Gradually Appeared Comorbid Lymphedema, Coronary Artery Disease, History: Hypertension Date Acquired: 08/02/2019 Weeks Of Treatment: 1 Clustered Wound: No Photos Wound Measurements Length: (cm) 0.1 Width: (cm) 0.1 Depth: (cm) 0.1 Area: (cm) 0.008 Volume: (cm) 0.001 % Reduction in Area: 97.9% % Reduction in Volume: 97.4% Epithelialization: None Wound Description Classification: Full  Thickness Without Exposed Support Struc Wound Margin: Flat and Intact Exudate Amount: Medium Exudate Type: Serous Exudate Color: amber tures Foul Odor After Cleansing: No Slough/Fibrino No Wound Bed Granulation Amount: Large (67-100%) Exposed Structure Granulation Quality: Pink Fascia Exposed: No Necrotic Amount: None Present (0%) Fat Layer (Subcutaneous Tissue) Exposed: Yes Tendon Exposed: No Muscle Exposed: No Joint Exposed: No Bone Exposed: No Treatment Notes Wound #8 (Left, Anterior Lower Leg) Notes scell, ABD, 3layer left Electronic Signature(s) Signed: 09/10/2019 4:16:16 PM By: Melene Plan ([id number]) Entered By: Army Melia on 09/10/2019 15:58:47 Steven Meza ([id number]) -------------------------------------------------------------------------------- Wound Assessment Details  Patient Name: ARCHIE, LITTLE Date of Service: 09/10/2019 3:15 PM Medical Record Number: [id number] Patient Account Number: [id number] Date of Birth/Sex: [birth year] (86 y.o. M) Treating RN: Army Melia Primary Care Trystin Hargrove: Fulton Reek Other Clinician: Referring Kaoir Loree: Fulton Reek Treating Jihan Mellette/Extender: Melburn Hake, HOYT Weeks in Treatment: 23 Wound Status Wound Number: 9 Primary Etiology: Lymphedema Wound Location: Left, Proximal, Medial Lower Leg Wound Status: Open Wounding Event: Gradually Appeared Comorbid Lymphedema, Coronary Artery Disease, History: Hypertension Date Acquired: 08/02/2019 Weeks Of Treatment: 1 Clustered Wound: No Photos Wound Measurements Length: (cm) 0.5 Width: (cm) 0.5 Depth: (cm) 0.1 Area: (cm) 0.196 Volume: (cm) 0.02 % Reduction in Area: 72.3% % Reduction in Volume: 71.8% Epithelialization: None Wound Description Classification: Full Thickness Without Exposed Support Struc Wound Margin: Flat and Intact Exudate Amount: Medium Exudate Type: Serous Exudate Color: amber tures Foul Odor After Cleansing:  No Slough/Fibrino Yes Wound Bed Granulation Amount: Large (67-100%) Exposed Structure Granulation Quality: Pink Fascia Exposed: No Necrotic Amount: Small (1-33%) Fat Layer (Subcutaneous Tissue) Exposed: Yes Necrotic Quality: Adherent Slough Tendon Exposed: No Muscle Exposed: No Joint Exposed: No Bone Exposed: No Treatment Notes Wound #9 (Left, Proximal, Medial Lower Leg) Notes scell, ABD, 3layer left Electronic Signature(s) Signed: 09/10/2019 4:16:16 PM By: Melene Plan ([id number]) Entered By: Army Melia on 09/10/2019 15:59:07 Steven Meza ([id number]) -------------------------------------------------------------------------------- Vitals Details Patient Name: Steven Meza Date of Service: 09/10/2019 3:15 PM Medical Record Number: [id number] Patient Account Number: [id number] Date of Birth/Sex: 09-29-1930 (86 y.o. M) Treating RN: Army Melia Primary Care Spence Soberano: Fulton Reek Other Clinician: Referring Izabella Marcantel: Fulton Reek Treating Stepfanie Yott/Extender: Melburn Hake, HOYT Weeks in Treatment: 23 Vital Signs Time Taken: 15:25 Temperature (F): 98.4 Height (in): 65 Pulse (bpm): 71 Weight (lbs): 273 Respiratory Rate (breaths/min): 18 Body Mass Index (BMI): 45.4 Blood Pressure (mmHg): 121/70 Reference Range: 80 - 120 mg / dl Electronic Signature(s) Signed: 09/10/2019 4:36:30 PM By: Lorine Bears RCP, RRT, CHT Entered By: Lorine Bears on 09/10/2019 15:28:19

## 2019-09-17 ENCOUNTER — Ambulatory Visit: Payer: Medicare Other | Admitting: Physician Assistant

## 2019-09-19 ENCOUNTER — Encounter: Payer: Medicare Other | Admitting: Internal Medicine

## 2019-09-19 ENCOUNTER — Other Ambulatory Visit: Payer: Self-pay

## 2019-09-19 DIAGNOSIS — I890 Lymphedema, not elsewhere classified: Secondary | ICD-10-CM | POA: Diagnosis not present

## 2019-09-20 NOTE — Progress Notes (Signed)
Steven Meza ([id number]) Visit Report for 09/19/2019 Arrival Information Details Patient Name: Steven Meza, Steven Meza Date of Service: 09/19/2019 2:30 PM Medical Record Number: [id number] Patient Account Number: [id number] Date of Birth/Sex: [birth year] (86 y.o. M) Treating RN: Cornell Barman Primary Care Cuthbert Turton: Fulton Reek Other Clinician: Referring Sharryn Belding: Fulton Reek Treating Quindell Shere/Extender: Tito Dine in Treatment: 24 Visit Information History Since Last Visit Added or deleted any medications: No Patient Arrived: Walker Any new allergies or adverse reactions: No Arrival Time: 14:43 Had a fall or experienced change in No Accompanied By: wife activities of daily living that may affect Transfer Assistance: None risk of falls: Patient Identification Verified: Yes Signs or symptoms of abuse/neglect since last visito No Secondary Verification Process Completed: Yes Hospitalized since last visit: No Implantable device outside of the clinic excluding No cellular tissue based products placed in the center since last visit: Has Dressing in Place as Prescribed: Yes Has Compression in Place as Prescribed: Yes Pain Present Now: No Electronic Signature(s) Signed: 09/19/2019 4:33:32 PM By: Lorine Bears RCP, RRT, CHT Entered By: Lorine Bears on 09/19/2019 14:43:45 Steven Meza ([id number]) -------------------------------------------------------------------------------- Clinic Level of Care Assessment Details Patient Name: Steven Meza Date of Service: 09/19/2019 2:30 PM Medical Record Number: [id number] Patient Account Number: [id number] Date of Birth/Sex: [birth year] (86 y.o. M) Treating RN: Cornell Barman Primary Care Avraj Lindroth: Fulton Reek Other Clinician: Referring Keelon Zurn: Fulton Reek Treating Dorothe Elmore/Extender: Tito Dine in Treatment: 24 Clinic Level of Care Assessment Items TOOL 4 Quantity Score []  -  Use when only an EandM is performed on FOLLOW-UP visit 0 ASSESSMENTS - Nursing Assessment / Reassessment X - Reassessment of Co-morbidities (includes updates in patient status) 1 10 X- 1 5 Reassessment of Adherence to Treatment Plan ASSESSMENTS - Wound and Skin Assessment / Reassessment X - Simple Wound Assessment / Reassessment - one wound 1 5 []  - 0 Complex Wound Assessment / Reassessment - multiple wounds []  - 0 Dermatologic / Skin Assessment (not related to wound area) ASSESSMENTS - Focused Assessment []  - Circumferential Edema Measurements - multi extremities 0 []  - 0 Nutritional Assessment / Counseling / Intervention []  - 0 Lower Extremity Assessment (monofilament, tuning fork, pulses) []  - 0 Peripheral Arterial Disease Assessment (using hand held doppler) ASSESSMENTS - Ostomy and/or Continence Assessment and Care []  - Incontinence Assessment and Management 0 []  - 0 Ostomy Care Assessment and Management (repouching, etc.) PROCESS - Coordination of Care X - Simple Patient / Family Education for ongoing care 1 15 []  - 0 Complex (extensive) Patient / Family Education for ongoing care []  - 0 Staff obtains Programmer, systems, Records, Test Results / Process Orders []  - 0 Staff telephones HHA, Nursing Homes / Clarify orders / etc []  - 0 Routine Transfer to another Facility (non-emergent condition) []  - 0 Routine Hospital Admission (non-emergent condition) []  - 0 New Admissions / Biomedical engineer / Ordering NPWT, Apligraf, etc. []  - 0 Emergency Hospital Admission (emergent condition) X- 1 10 Simple Discharge Coordination []  - 0 Complex (extensive) Discharge Coordination PROCESS - Special Needs []  - Pediatric / Minor Patient Management 0 []  - 0 Isolation Patient Management []  - 0 Hearing / Language / Visual special needs []  - 0 Assessment of Community assistance (transportation, D/C planning, etc.) []  - 0 Additional assistance / Altered mentation []  - 0 Support  Surface(s) Assessment (bed, cushion, seat, etc.) INTERVENTIONS - Wound Cleansing / Measurement ZUHAYR, BEDROSIAN A. ([id number]) X- 1 5 Simple Wound Cleansing - one wound []  - 0  Complex Wound Cleansing - multiple wounds X- 1 5 Wound Imaging (photographs - any number of wounds) []  - 0 Wound Tracing (instead of photographs) X- 1 5 Simple Wound Measurement - one wound []  - 0 Complex Wound Measurement - multiple wounds INTERVENTIONS - Wound Dressings []  - Small Wound Dressing one or multiple wounds 0 []  - 0 Medium Wound Dressing one or multiple wounds []  - 0 Large Wound Dressing one or multiple wounds []  - 0 Application of Medications - topical []  - 0 Application of Medications - injection INTERVENTIONS - Miscellaneous []  - External ear exam 0 []  - 0 Specimen Collection (cultures, biopsies, blood, body fluids, etc.) []  - 0 Specimen(s) / Culture(s) sent or taken to Lab for analysis []  - 0 Patient Transfer (multiple staff / Civil Service fast streamer / Similar devices) []  - 0 Simple Staple / Suture removal (25 or less) []  - 0 Complex Staple / Suture removal (26 or more) []  - 0 Hypo / Hyperglycemic Management (close monitor of Blood Glucose) []  - 0 Ankle / Brachial Index (ABI) - do not check if billed separately X- 1 5 Vital Signs Has the patient been seen at the hospital within the last three years: Yes Total Score: 65 Level Of Care: New/Established - Level 2 Electronic Signature(s) Signed: 09/20/2019 9:48:50 AM By: Gretta Cool, BSN, RN, CWS, Kim RN, BSN Entered By: Gretta Cool, BSN, RN, CWS, Kim on 09/19/2019 15:22:26 Steven Meza ([id number]) -------------------------------------------------------------------------------- Encounter Discharge Information Details Patient Name: Steven Meza Date of Service: 09/19/2019 2:30 PM Medical Record Number: [id number] Patient Account Number: [id number] Date of Birth/Sex: 07-Jul-1930 (86 y.o. M) Treating RN: Cornell Barman Primary Care Delford Wingert: Fulton Reek Other Clinician: Referring Shad Ledvina: Fulton Reek Treating Luismiguel Lamere/Extender: Tito Dine in Treatment: 24 Encounter Discharge Information Items Discharge Condition: Stable Ambulatory Status: Ambulatory Discharge Destination: Home Transportation: Private Auto Accompanied By: wife Schedule Follow-up Appointment: No Clinical Summary of Care: Electronic Signature(s) Signed: 09/20/2019 9:48:50 AM By: Gretta Cool, BSN, RN, CWS, Kim RN, BSN Entered By: Gretta Cool, BSN, RN, CWS, Kim on 09/19/2019 15:24:42 Steven Meza ([id number]) -------------------------------------------------------------------------------- Lower Extremity Assessment Details Patient Name: Steven Meza Date of Service: 09/19/2019 2:30 PM Medical Record Number: [id number] Patient Account Number: [id number] Date of Birth/Sex: 1930-07-17 (86 y.o. M) Treating RN: Army Melia Primary Care Emersynn Deatley: Fulton Reek Other Clinician: Referring Keoni Havey: Fulton Reek Treating Endia Moncur/Extender: Tito Dine in Treatment: 24 Edema Assessment Assessed: [Left: No] [Right: No] Edema: [Left: N] [Right: o] Calf Left: Right: Point of Measurement: 33 cm From Medial Instep 43 cm cm Ankle Left: Right: Point of Measurement: 12 cm From Medial Instep 27 cm cm Vascular Assessment Pulses: Dorsalis Pedis Palpable: [Left:Yes] Electronic Signature(s) Signed: 09/19/2019 4:56:08 PM By: Army Melia Entered By: Army Melia on 09/19/2019 15:00:11 Steven Meza ([id number]) -------------------------------------------------------------------------------- Multi Wound Chart Details Patient Name: Steven Meza Date of Service: 09/19/2019 2:30 PM Medical Record Number: [id number] Patient Account Number: [id number] Date of Birth/Sex: 03-Aug-1930 (86 y.o. M) Treating RN: Cornell Barman Primary Care Glenroy Crossen: Fulton Reek Other Clinician: Referring Giann Obara: Fulton Reek Treating Juanna Pudlo/Extender: Tito Dine in Treatment: 24 Vital Signs Height(in): 65 Pulse(bpm): 41 Weight(lbs): 29 Blood Pressure(mmHg): 140/50 Body Mass Index(BMI): 45 Temperature(F): 98.6 Respiratory Rate(breaths/min): 18 Photos: Wound Location: Left, Distal, Medial Lower Leg Left, Anterior Lower Leg Left, Proximal, Medial Lower Leg Wounding Event: Gradually Appeared Gradually Appeared Gradually Appeared Primary Etiology: Lymphedema Lymphedema Lymphedema Comorbid History: Lymphedema, Coronary Artery Lymphedema, Coronary Artery Lymphedema, Coronary Artery Disease, Hypertension Disease, Hypertension Disease,  Hypertension Date Acquired: 08/02/2019 08/02/2019 08/02/2019 Weeks of Treatment: 2 2 2  Wound Status: Open Open Open Measurements L x W x D (cm) 0x0x0 0x0x0 0x0x0 Area (cm) : 0 0 0 Volume (cm) : 0 0 0 % Reduction in Area: 100.00% 100.00% 100.00% % Reduction in Volume: 100.00% 100.00% 100.00% Classification: Full Thickness Without Exposed Full Thickness Without Exposed Full Thickness Without Exposed Support Structures Support Structures Support Structures Exudate Amount: None Present None Present None Present Wound Margin: Flat and Intact Flat and Intact Flat and Intact Granulation Amount: None Present (0%) None Present (0%) None Present (0%) Necrotic Amount: None Present (0%) None Present (0%) None Present (0%) Exposed Structures: Fascia: No Fascia: No Fascia: No Fat Layer (Subcutaneous Tissue) Fat Layer (Subcutaneous Tissue) Fat Layer (Subcutaneous Tissue) Exposed: No Exposed: No Exposed: No Tendon: No Tendon: No Tendon: No Muscle: No Muscle: No Muscle: No Joint: No Joint: No Joint: No Bone: No Bone: No Bone: No Epithelialization: Large (67-100%) Large (67-100%) Large (67-100%) Treatment Notes Electronic Signature(s) Signed: 09/19/2019 5:06:12 PM By: Linton Ham MD Entered By: Linton Ham on 09/19/2019 15:56:04 Steven Meza  ([id number]) -------------------------------------------------------------------------------- Multi-Disciplinary Care Plan Details Patient Name: Steven Meza Date of Service: 09/19/2019 2:30 PM Medical Record Number: [id number] Patient Account Number: [id number] Date of Birth/Sex: 1931/04/15 (86 y.o. M) Treating RN: Cornell Barman Primary Care Cassandr Cederberg: Fulton Reek Other Clinician: Referring Ellsworth Waldschmidt: Fulton Reek Treating Noemy Hallmon/Extender: Tito Dine in Treatment: 24 Active Inactive Electronic Signature(s) Signed: 09/20/2019 9:48:50 AM By: Gretta Cool, BSN, RN, CWS, Kim RN, BSN Entered By: Gretta Cool, BSN, RN, CWS, Kim on 09/19/2019 15:20:00 Steven Meza ([id number]) -------------------------------------------------------------------------------- Pain Assessment Details Patient Name: Steven Meza Date of Service: 09/19/2019 2:30 PM Medical Record Number: [id number] Patient Account Number: [id number] Date of Birth/Sex: 06-06-1930 (86 y.o. M) Treating RN: Army Melia Primary Care Aleen Marston: Fulton Reek Other Clinician: Referring Rylann Munford: Fulton Reek Treating Homero Hyson/Extender: Tito Dine in Treatment: 24 Active Problems Location of Pain Severity and Description of Pain Patient Has Paino No Site Locations Pain Management and Medication Current Pain Management: Electronic Signature(s) Signed: 09/19/2019 4:56:08 PM By: Army Melia Entered By: Army Melia on 09/19/2019 14:58:02 Steven Meza ([id number]) -------------------------------------------------------------------------------- Patient/Caregiver Education Details Patient Name: Steven Meza Date of Service: 09/19/2019 2:30 PM Medical Record Number: [id number] Patient Account Number: [id number] Date of Birth/Gender: [birth year] (86 y.o. M) Treating RN: Cornell Barman Primary Care Physician: Fulton Reek Other Clinician: Referring Physician: Fulton Reek Treating Physician/Extender:  Tito Dine in Treatment: 75 Education Assessment Education Provided To: Patient Education Topics Provided Basic Hygiene: Handouts: Other: lotion legs Methods: Demonstration Responses: State content correctly Venous: Handouts: Controlling Swelling with Compression Stockings , Other: velcro stockings Methods: Demonstration Responses: State content correctly Electronic Signature(s) Signed: 09/20/2019 9:48:50 AM By: Gretta Cool, BSN, RN, CWS, Kim RN, BSN Entered By: Gretta Cool, BSN, RN, CWS, Kim on 09/19/2019 15:23:24 Steven Meza ([id number]) -------------------------------------------------------------------------------- Wound Assessment Details Patient Name: Steven Meza Date of Service: 09/19/2019 2:30 PM Medical Record Number: [id number] Patient Account Number: [id number] Date of Birth/Sex: April 17, 1931 (86 y.o. M) Treating RN: Army Melia Primary Care Farheen Pfahler: Fulton Reek Other Clinician: Referring Poppi Scantling: Fulton Reek Treating Gurkirat Basher/Extender: Tito Dine in Treatment: 24 Wound Status Wound Number: 10 Primary Etiology: Lymphedema Wound Location: Left, Distal, Medial Lower Leg Wound Status: Open Wounding Event: Gradually Appeared Comorbid Lymphedema, Coronary Artery Disease, History: Hypertension Date Acquired: 08/02/2019 Weeks Of Treatment: 2 Clustered Wound: No Photos Wound Measurements Length: (cm) 0 Width: (cm) 0 Depth: (  cm) 0 Area: (cm) Volume: (cm) % Reduction in Area: 100% % Reduction in Volume: 100% Epithelialization: Large (67-100%) 0 0 Wound Description Classification: Full Thickness Without Exposed Support Structure Wound Margin: Flat and Intact Exudate Amount: None Present s Foul Odor After Cleansing: No Slough/Fibrino Yes Wound Bed Granulation Amount: None Present (0%) Exposed Structure Necrotic Amount: None Present (0%) Fascia Exposed: No Fat Layer (Subcutaneous Tissue) Exposed: No Tendon Exposed: No Muscle  Exposed: No Joint Exposed: No Bone Exposed: No Electronic Signature(s) Signed: 09/19/2019 4:56:08 PM By: Army Melia Entered By: Army Melia on 09/19/2019 14:59:45 Steven Meza ([id number]) -------------------------------------------------------------------------------- Wound Assessment Details Patient Name: Steven Meza Date of Service: 09/19/2019 2:30 PM Medical Record Number: [id number] Patient Account Number: [id number] Date of Birth/Sex: 08/20/1930 (86 y.o. M) Treating RN: Army Melia Primary Care Malisha Mabey: Fulton Reek Other Clinician: Referring Gabriel Conry: Fulton Reek Treating Balbina Depace/Extender: Tito Dine in Treatment: 24 Wound Status Wound Number: 8 Primary Etiology: Lymphedema Wound Location: Left, Anterior Lower Leg Wound Status: Open Wounding Event: Gradually Appeared Comorbid Lymphedema, Coronary Artery Disease, History: Hypertension Date Acquired: 08/02/2019 Weeks Of Treatment: 2 Clustered Wound: No Photos Wound Measurements Length: (cm) 0 Width: (cm) 0 Depth: (cm) 0 Area: (cm) Volume: (cm) % Reduction in Area: 100% % Reduction in Volume: 100% Epithelialization: Large (67-100%) 0 0 Wound Description Classification: Full Thickness Without Exposed Support Structure Wound Margin: Flat and Intact Exudate Amount: None Present s Foul Odor After Cleansing: No Slough/Fibrino No Wound Bed Granulation Amount: None Present (0%) Exposed Structure Necrotic Amount: None Present (0%) Fascia Exposed: No Fat Layer (Subcutaneous Tissue) Exposed: No Tendon Exposed: No Muscle Exposed: No Joint Exposed: No Bone Exposed: No Electronic Signature(s) Signed: 09/19/2019 4:56:08 PM By: Army Melia Entered By: Army Melia on 09/19/2019 14:58:41 Steven Meza ([id number]) -------------------------------------------------------------------------------- Wound Assessment Details Patient Name: Steven Meza Date of Service: 09/19/2019 2:30  PM Medical Record Number: [id number] Patient Account Number: [id number] Date of Birth/Sex: 09-25-1930 (86 y.o. M) Treating RN: Army Melia Primary Care Vitali Seibert: Fulton Reek Other Clinician: Referring Davanee Klinkner: Fulton Reek Treating Beckett Hickmon/Extender: Tito Dine in Treatment: 24 Wound Status Wound Number: 9 Primary Etiology: Lymphedema Wound Location: Left, Proximal, Medial Lower Leg Wound Status: Open Wounding Event: Gradually Appeared Comorbid Lymphedema, Coronary Artery Disease, History: Hypertension Date Acquired: 08/02/2019 Weeks Of Treatment: 2 Clustered Wound: No Photos Wound Measurements Length: (cm) 0 Width: (cm) 0 Depth: (cm) 0 Area: (cm) Volume: (cm) % Reduction in Area: 100% % Reduction in Volume: 100% Epithelialization: Large (67-100%) 0 Tunneling: No 0 Undermining: No Wound Description Classification: Full Thickness Without Exposed Support Structure Wound Margin: Flat and Intact Exudate Amount: None Present s Foul Odor After Cleansing: No Slough/Fibrino No Wound Bed Granulation Amount: None Present (0%) Exposed Structure Necrotic Amount: None Present (0%) Fascia Exposed: No Fat Layer (Subcutaneous Tissue) Exposed: No Tendon Exposed: No Muscle Exposed: No Joint Exposed: No Bone Exposed: No Electronic Signature(s) Signed: 09/19/2019 4:56:08 PM By: Army Melia Entered By: Army Melia on 09/19/2019 14:59:10 Steven Meza ([id number]) -------------------------------------------------------------------------------- [place] Details Patient Name: Steven Meza Date of Service: 09/19/2019 2:30 PM Medical Record Number: [id number] Patient Account Number: [id number] Date of Birth/Sex: 09/11/1930 (86 y.o. M) Treating RN: Cornell Barman Primary Care Bryttany Tortorelli: Fulton Reek Other Clinician: Referring India Jolin: Fulton Reek Treating Lavarr President/Extender: Tito Dine in Treatment: 24 Vital Signs Time Taken: 14:40 Temperature  (F): 98.6 Height (in): 65 Pulse (bpm): 62 Weight (lbs): 273 Respiratory Rate (breaths/min): 18 Body Mass Index (BMI): 45.4 Blood Pressure (mmHg):  140/50 Reference Range: 80 - 120 mg / dl Electronic Signature(s) Signed: 09/19/2019 4:33:32 PM By: Lorine Bears RCP, RRT, CHT Entered By: Becky Sax, Amado Nash on 09/19/2019 14:45:27

## 2019-09-20 NOTE — Progress Notes (Signed)
Steven Meza, Steven Meza ([id number]) Visit Report for 09/19/2019 HPI Details Patient Name: Steven Meza, Steven Meza Date of Service: 09/19/2019 2:30 PM Medical Record Number: [id number] Patient Account Number: [id number] Date of Birth/Sex: 02/13/1931 (86 y.o. M) Treating RN: Steven Meza Primary Care Provider: Fulton Meza Other Clinician: Referring Provider: Fulton Meza Treating Provider/Extender: Steven Meza in Treatment: 24 History of Present Illness HPI Description: ADMISSION 05/17/2018 Steven Meza is an 86 year old man who is been treated at the lymphedema clinic for a long period with lymphedema wraps for bilateral lower extremity lymphedema. About 6 months ago they noted small open areas on his left lateral calf just above the ankle. These were open. They are apparently putting some form of ointment on this. They have been referred here for our review of this. The patient has a long history of lymphedema and venous stasis. It says in his records that he has recurrent cellulitis although his wife denies this but she states that the lymphedema may have started with cellulitis several years ago. Also in his record there is a history of bladder cancer which they seem to know very little about however he did not have any radiation to the pelvis or anything that could have contributed to lymphedema that they are aware of. There is no prior wound history. The patient has 2 small punched out areas on the left lateral calf that have adherent debris on the surface. Past medical history; lymphedema, hypertension, cellulitis, hearing loss, morbid obesity, osteoarthritis, venous stasis, bladder CA, diverticulosis. ABIs in our clinic were 0.36 on the right and 0.57 on the left 05/24/2018; corrected age on this patient is 86 versus what I said last week. He has been for his arterial studies which predictably are not very good. On the right his ABI is 0.65. Monophasic waveforms noted at the right ankle  including the posterior tibial and dorsalis pedis. He has triphasic waveforms of the common femoral and profunda femoris triphasic proximal SFA with monophasic distal FSA and popliteal artery. Monophasic tibial waveforms. On the left his ABI is 0.58 monophasic waveforms at the left ankle. Duplex of the left lower extremity demonstrated atherosclerotic change with monophasic waveforms throughout the left lower extremity. There was some concern about left iliac disease and potentially femoral-popliteal and tibial disease. The patient does not describe claudication although according to his wife he over emphasizes his activity. Patient states he is limited by pain in both knees His wounds are on the left lateral calf. 2 small areas with necrotic debris. We have been using silver collagen and light Ace wraps 05/31/2018; wounds on the left lateral calf in the setting of very significant lymphedema and probably PAD. We have been using silver collagen. The base of the small wound looks reasonably improved. I sent him to see Dr. Fletcher Meza however I see now he has an appointment with Dr. Alvester Meza on February 12 2/5; left lateral calf wound looks the same. His wife is doing a good job with her lymphedema wraps and maintaining that the edema. He most likely has PAD and is due to see Steven Meza of infectious disease on February 12 2/19; left lateral calf wounds look about the same. This looks like wound secondary to chronic venous insufficiency with lymphedema however the patient has very poor arterial studies. We referred him to Steven Meza. Steven Meza did not feel he needed to do anything from an arterial point of view. He did not address the question about the aggressiveness of compression. After some thoughts about  this I elected to go ahead and put him in 3 layer compression which after all would be less compression then the lymphedema clinic was putting on this. I am hopeful that this should be enough to get some  closure of the small wounds 2/26; left lateral calf wound letter this week. He tolerated the 3 layer compression well furthermore he is sleeping in a hospital bed which helps keep his legs up at night. The wound looks better measuring smaller especially in width 3/3; left lateral calf wound about the same size. The 3 layer compression has really helped get the edema down in his leg however. Using silver collagen 3/11; left lateral calf wound better looking wound surface but about the same size. We have been using 3 layer compression which is really helped get the edema down in the left leg. Even after Steven Meza assessment I am reluctant to go to 4 layer compression. He is tolerating 3 layer compression well. We have been using silver collagen 3/18-Patient returns for his left lateral calf wound which overall is looking better, slightly increased in size and dimensions and area, he is tolerating the 3 layer compression, has been dressed with silver collagen which we will continue 3/25oleft lateral calf wound much better looking. Size is smaller. He has been using silver collagen 4/1; gradually getting smaller in size. Using silver collagen with 3 layer compression. I think the patient has some degree of PAD. He saw Steven Meza in consultation, he did not specifically comment on whether he could tolerate 4 layer compression 4/8; the wound is slightly smaller in depth. We are using 3 layer compression with silver collagen. I had him seen for arterial insufficiency however his ABI was 0.57 in the left leg. Steven Meza really did not seem to middle on the degree of compression he could tolerate 4/15; the wound is slightly smaller and slightly less deep. We have been using 3 layer compression with silver collagen. There is not an option for 4-layer compression here. 4/22; the patient is making decent progress on his wound on the left lateral calf however he arrives in with a superficial wound on the  right anterior tibial area. Were not really sure how this happened. He had been using a stocking on the right leg. He has been using silver collagen on the wounds Amazing to me he is he appears to have compression pumps at home. I am not really sure I knew this before. In any case he has not been using them 4/29; left lateral calf wound continues to get gradually smaller. This looks like it is on its way to closure. oThe area on the right anterior tibial area is about the same in terms of size superficial without any depth. This was probably wrap injury oHe tells me that he has been using his compression pumps once a day for 1 hour without any pain 5/6; not as good today. Left lateral calf wound is actually larger and he does not have quite as good edema control. The area on the right anterior Steven Meza, Steven A. ([id number]) tibia is about the same still superficial. He states he is using his compression pumps once a day. We are using 3 layer compression on the left and Curlex Coban on the right 5/13; the area on the right is very close to being healed. The area on the left still has some depth but has a healthy wound surface. He has excellent edema control bilaterally. Using Hydrofera Blue 5/20-Patient returns  in 1 week after being in 3 layer compression on the left, the left lateral calf ulcer is stable with some slough, the right anterior tibial ulcer is healed we have been using Kerlix Coban on that leg 5/27; Hydrofera Blue and 3 layer compression. Still some slough in the wound area and some nonviable edges around the wound. 6/3; this patient has lymphedema and chronic venous insufficiency. He had difficult to close wound on his left lateral calf. This is closed today. 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 Finally he has known PAD but he is managed to tolerate the compression we reporting on him and also using his compression pumps that he already had  at home twice a day 6/10; we discharge this patient last week having finally close the small area in the left lateral calf. Apparently by Friday of last week this had opened and was draining. He is therefore back in clinic. His wife who is present also was concerned about an area on the right anterior tibia."o 6/17; the patient does not have an open area on the right leg. On the left he has a small circular area anteriorly and the same area laterally that he had last time. However both of these appear to be improved he has his Farrow wrap 30/40. It turns out that he is only using his external compression pumps once a day. I tried to get him up to twice a day today 6/24; the patient comes in with his external compression garment on the right leg/Farrow wrap. As far as we know no open wounds here. He has 2 areas that are smaller one on the left anterior pretibial and one on the left lateral calf which is the site of his initial wounds. I have successfully caught him into his external compression pumps twice a day 7/1; still using his external compression garment on the right leg. As far as we know there are no open wounds here. The area on the left lateral calf which was the site of his initial wound has closed over. The area on the left anterior pretibial that we identified last week is still open. 7/8; as far as we know he has no open wounds on the right leg. He has his external compression garment on here. The original wound on the left lateral calf has closed over. He developed an area on the left anterior pretibial 2 weeks ago and seems to have 2 smaller areas on the proximal and distal lateral calf. We do not have his good edema control this week as I am used to seeing 7/15; the patient has still a linear area on the left anterior tibia. The area on the left lateral leg looked somewhat inflamed today but no cellulitis. He did have what looked to be a small pustule that I cultured for CandS  although I did not give him empiric antibiotics. 7/22; small area on the left anterior tibia. The area on the left lateral leg still looks inflamed. The pustule that I cultured last week grew Enterobacter and Pseudomonas. I prescribed Cipro they still have not picked that up. In any case the area actually looks fairly satisfactory. He has decent edema control using his compression pumps twice a day 7/29; the area on the left lateral leg looks a lot better than last week. Nothing is open here. He has an area just medial to the mid anterior tibia that is still open that is only open wound  today. 8/5-Patient returns at 1 week for the left leg ulcer, which is actually looking better, he is also complaining of left plantar foot pain below the great toe. He does have a hematoma here 8/12-Patient returns at 1 week, the left leg open area looks about the same if not slightly better, the left plantar hematoma under the great toe is about the same, patient apparently has not been resting his foot on anything for too long period of time and does not wear the [employer]kenstock shoes unless he walks outdoors which he does not do often 8/19; small wound on the left anterior lower leg this is just about closed there is nothing on the left lateral leg. He is using his compression pumps He arrives in clinic today with a black eschar over the right first plantar metatarsal head. This was apparently a bruise that was identified by her nurses last week. He wears crocs on his feet. He also may rub these on a footboard at home 8/26; there is nothing open on the left lateral lower leg he has a small area just medial to the tibia. He is going to see his podiatrist Dr. Milinda Pointer about the area on the left plantar first metatarsal head. He is using his external compression pumps. He has a juxta lite on the right leg 9/2; there is nothing open on either side of her left lower leg. He went to see podiatry about the area on the left first  metatarsal head. This was apparently debrided. He was given mupirocin and oral antibiotics. I do not believe he is offloading this at all. Readmission: 04/02/2019 upon evaluation today patient actually appears for reevaluation here in our clinic secondary to issues that he is having with his left lower extremity. He has been tolerating the dressing changes without complication. Fortunately there is no signs of active infection at this time. No fevers, chills, nausea, vomiting, or diarrhea. He has been in the hospital since we last saw him and then subsequently transferred back to [facility]. He tells me that they have been changing his dressing once a day and that he has had a significant amount of pain secondary to undergoing these dressing changes. Fortunately there is no evidence of active infection at this point which is good news. No fevers, chills, nausea, vomiting, or diarrhea. With that being said he wonders if we can go back to doing what we were doing before which was when he actually had the dressing changes performed once a week here in the clinic he tells me that he did very well with that. At that point he tolerated a 3 layer compression wrap which was approved by vascular, Dr. Tyrell Antonio office, to be used previous although 4-layer compression wrap is probably too much for him. 04/16/2019 on evaluation today patient appears to be doing decently well with regard to his lower extremity although he has had a lot of drainage from his leg. A lot of this I think is just secondary to the lymphedema with that being said he does have some odor as well which may again just be due to more of the excessive moisture although I think that he may also have something that is a result of bacteria again he has somewhat of a Pseudomonas-like smell to the drainage and fortunately though he is not showing any signs of severe cellulitis he is still experiencing quite a bit of drainage and the  discoloration is somewhat of a yellow/green in color which is consistent  with Pseudomonas. If he can tolerated I think he may do well with Cipro. 04/24/2019 on evaluation today patient appears to be doing well with regard to his wound in the gluteal region which is healing quite well. That is excellent news. With that being said he is also doing better with regard to his left lower extremity overall I feel like things are definitely showing signs of improvement. 12/28-Patient returns with regards to his gluteal wound that is improving, Left lower leg wounds with significant edema in the lower legs that appear to be persistent. Patient is in compression and has lymphedema pumps at home but apparently he does not use consistently 05/07/2019 Upon inspection patient's wound bed actually showed signs on evaluation today patient's wound currently showed signs of improvement. There is still a lot of maceration but it does look like that there is been some of this maceration occurring as a result of drainage in particular and some as a result of the fact that they have been putting bag balm on the wound area as well as the leg in general. I think this is applying too much moisture and not doing him any good. I discussed with the patient and his wife today that I think that practice needs to be adjusted I would recommend using a different type of lotion in order to help with moisture around the wound but I would not recommend anything directly on the wound itself. 05/14/2019 on evaluation today patient actually appears to be doing much better in regard to his lower extremity. They have not been using any of the bag balm on the area I think this has made a tremendous improvement overall in his wounds. He has a lot of new skin growth and though Steven Meza, Steven Meza ([id number]) there still are a lot of open wounds scattered throughout the circumferential lower leg this overall seems to be doing much better to  me. 05/21/2019 upon evaluation today patient appears to be doing well in regard to his lower extremity although he is having unfortunately some blue- green drainage noted at this point. The does have me concerned for a reinitiation of the Pseudomonas infection that we previously treated in December. The last time I gave him a prescription was April 16, 2019. That was a little over a month ago. Nonetheless he does have more green drainage noted today and I think this is indicative of potentially a revving up infection. Obviously we do not want to get to that point. 05/28/2019 on evaluation today patient appears to be doing better with regard to his lower extremity at this point. Overall I am seeing signs of improvement which is good news. Fortunately there is no evidence of active infection at this time. I do believe that the Cipro has been beneficial for him that was prescribed last week for 14 days he is halfway through that and already appears to be doing much better. This is excellent news. 06/04/2019 upon evaluation today I do believe that the patient is doing better with regard to his lower extremity. I feel like that he has much less in the way of open wounds and drainage and much in provement in regard to the overall weeping and even his swelling. There is no signs of erythema no obvious evidence of active infection all of which is great news. No fevers, chills, nausea, vomiting, or diarrhea. 06/11/2019 on evaluation today patient appears to be doing better with regard to his lower extremity on the left. He has been  tolerating dressing changes without complication. Fortunately there is no signs of active infection at this time. No fevers, chills, nausea, vomiting, or diarrhea. 06/18/2019 upon evaluation today patient appears to be doing slightly worse compared to last week. They did call me on Friday due to the fact that he had green discharge and both his wife as well as his home health nurse were  concerned about what they were seeing. Subsequently I did actually send in a prescription for Cipro for 14 days for him and according to his wife this seems to be doing better based on what she is seeing today compared to Friday. This is great news. Home health is still coming out to see him on Wednesdays and Fridays as well. 06/25/2019 upon evaluation today patient appears to be doing somewhat better in regard to his leg ulcer. Fortunately there is no signs of active infection at this time which is good news. With that being said I am very pleased with how things seem to be going and overall I see no major issues here I think the alginate is doing well along with the XtraSorb. With regard to the patient's plantar foot he does have somewhat of a blood blister that appears to be resolving at this point his wife states that she feels like the issue is that he is not dropping his shoe in order to keep it from sliding when he walks. Subsequently this is leading to increases in friction which of course is leading to more issues with getting this area to heal the patient is struggling in that regard. 07/02/2019 upon evaluation today patient appears to be making some progress in regard to his lower extremities. With that being said I really feel like the alginate may be getting too moist and then sitting on the skin causing some complications here for [place]. I discussed with the patient and his wife today as well the possibility of going ahead and discontinuing the alginate just utilizing the Xtrasorb to try to keep the edema under better control while at the same time preventing any dressings from staying too moist right on the surface of the wounds. Obviously I think this can do a good job in helping to dry things out but I would recommend that we give this a shot. 07/09/2019 upon evaluation today patient appears to be doing better in regard to some of the edema and weeping in regard to the left lower extremity.  Unfortunately he has not been wearing his compression of the right lower extremity and now that is open as well. He needs to obviously be wearing this although for today we will get a half to wrap him due to the fact that this is now open. I explained that he does need to be wearing his compression at all times. 07/16/2019 upon evaluation today patient actually does appear to be making some progress in regard to his lower extremity ulcers at this point. In fact he really does not have any ulcerations as much as he just does have lymphedema breakdown causing weeping. Nonetheless he is drying up quite a bit but overall seems to be doing much better. He has a lot of dry flaky skin I think we may want to add triamcinolone to the skin to try to help out with cutting back on some of the irritation here hopefully improve the overall skin quality. He does have Velcro compression wraps once he heals that is good news as well. 07/23/2019 upon evaluation today patient appears  to be doing better with regard to his bilateral lower extremities. I think we are getting closer to getting everything sealed up. Again he does not have any significant wounds and his lymphedema has dramatically improved. 07/30/2019 upon evaluation today patient appears to be doing excellent in regard to his bilateral lower extremities. Fortunately there is no signs of active infection at this time. Overall the patient seems to be in fact completely sealed up as far as any areas of weeping or concern which also has me thrilled based on what we are seeing currently. Overall I think that he is doing extremely well. 09/03/2019 upon evaluation today patient appears to be doing somewhat poorly in regard to his left lower extremity with regard to open wounds. Fortunately there is nothing that appears to be draining too terribly although he has about 3 different areas that are open as far as wounds are concerned all 3 seem to be fairly superficial. He  was discharged from our clinic on 07/30/2019. Subsequently he reopened in the interim apparently at home health never completely discharged home from being seen. They in fact have still continue to wrap his left leg up until now. 09/10/2019 upon evaluation today patient actually appears to be doing better with regard to his lower extremities. Fortunately there is no signs of active infection at this time. No fevers, chills, nausea, vomiting, or diarrhea. Overall been fairly pleased with how things seem to be progressing at this point. The patient is likewise happy that his legs look better. He tells me he has been elevating more. With that being said he does not really commit to exactly how much more and his wife tells me he still spends a lot of time with his feet on the ground. 5/19; have not seen this patient in quite some time. He had wounds on his left lower extremity. These are all healed today. He has juxta lites for both legs. He has 1 on the right leg it looks as though he has been putting this on well. His wife emphasizes that he lotions his own legs and puts on his own stocking. He also has external compression pumps that he uses twice a day Electronic Signature(s) Signed: 09/19/2019 5:06:12 PM By: Linton Ham MD Entered By: Linton Ham on 09/19/2019 15:57:07 Steven Meza ([id number]) -------------------------------------------------------------------------------- Physical Exam Details Patient Name: Steven Meza Date of Service: 09/19/2019 2:30 PM Medical Record Number: [id number] Patient Account Number: [id number] Date of Birth/Sex: 1930/10/23 (86 y.o. M) Treating RN: Steven Meza Primary Care Provider: Fulton Meza Other Clinician: Referring Provider: Fulton Meza Treating Provider/Extender: Steven Meza in Treatment: 24 Constitutional Patient is hypertensive.. Pulse regular and within target range for patient.Marland Kitchen Respirations regular, non-labored and  within target range.. Temperature is normal and within the target range for the patient.Marland Kitchen appears in no distress. Cardiovascular Pedal pulses are palpable. Edema control is adequate. Notes Wound exam; the patient's left leg are dry with flaking skin on the left leg although there is no open area that I can find. Electronic Signature(s) Signed: 09/19/2019 5:06:12 PM By: Linton Ham MD Entered By: Linton Ham on 09/19/2019 15:58:51 Steven Meza ([id number]) -------------------------------------------------------------------------------- Physician Orders Details Patient Name: Steven Meza Date of Service: 09/19/2019 2:30 PM Medical Record Number: [id number] Patient Account Number: [id number] Date of Birth/Sex: 13-Aug-1930 (86 y.o. M) Treating RN: Steven Meza Primary Care Provider: Fulton Meza Other Clinician: Referring Provider: Fulton Meza Treating Provider/Extender: Steven Meza in Treatment: 15 Verbal / Phone Orders:  No Diagnosis Coding Edema Control o Patient to wear own Velcro compression garment. o Elevate legs to the level of the heart and pump ankles as often as possible o Compression Pump: Use compression pump on left lower extremity for 60 minutes, twice daily. o Compression Pump: Use compression pump on right lower extremity for 60 minutes, twice daily. Discharge From [facility] o Discharge from [facility] - treatment complete Electronic Signature(s) Signed: 09/19/2019 5:06:12 PM By: Linton Ham MD Signed: 09/20/2019 9:48:50 AM By: Gretta Cool, BSN, RN, CWS, Kim RN, BSN Entered By: Gretta Cool, BSN, RN, CWS, Kim on 09/19/2019 15:21:52 Steven Meza ([id number]) -------------------------------------------------------------------------------- Problem List Details Patient Name: Steven Meza Date of Service: 09/19/2019 2:30 PM Medical Record Number: [id number] Patient Account Number: [id number] Date of Birth/Sex: [birth year] (86  y.o. M) Treating RN: Steven Meza Primary Care Provider: Fulton Meza Other Clinician: Referring Provider: Fulton Meza Treating Provider/Extender: Steven Meza in Treatment: 24 Active Problems ICD-10 Encounter Meza Description Active Date MDM Diagnosis I89.0 Lymphedema, not elsewhere classified 04/02/2019 No Yes I87.2 Venous insufficiency (chronic) (peripheral) 04/02/2019 No Yes L97.822 Non-pressure chronic ulcer of other part of left lower leg with fat layer 04/02/2019 No Yes exposed [place] (primary) hypertension 04/02/2019 No Yes I25.10 Atherosclerotic heart disease of native coronary artery without angina 04/02/2019 No Yes pectoris L84 Corns and callosities 05/09/2019 No Yes Inactive Problems Resolved Problems Electronic Signature(s) Signed: 09/19/2019 5:06:12 PM By: Linton Ham MD Entered By: Linton Ham on 09/19/2019 15:55:55 Steven Meza ([id number]) -------------------------------------------------------------------------------- Progress Note Details Patient Name: Steven Meza Date of Service: 09/19/2019 2:30 PM Medical Record Number: [id number] Patient Account Number: [id number] Date of Birth/Sex: 1930/05/14 (86 y.o. M) Treating RN: Steven Meza Primary Care Provider: Fulton Meza Other Clinician: Referring Provider: Fulton Meza Treating Provider/Extender: Steven Meza in Treatment: 24 Subjective History of Present Illness (HPI) ADMISSION 05/17/2018 Mr. Braband is an 86 year old man who is been treated at the lymphedema clinic for a long period with lymphedema wraps for bilateral lower extremity lymphedema. About 6 months ago they noted small open areas on his left lateral calf just above the ankle. These were open. They are apparently putting some form of ointment on this. They have been referred here for our review of this. The patient has a long history of lymphedema and venous stasis. It says in his records that he  has recurrent cellulitis although his wife denies this but she states that the lymphedema may have started with cellulitis several years ago. Also in his record there is a history of bladder cancer which they seem to know very little about however he did not have any radiation to the pelvis or anything that could have contributed to lymphedema that they are aware of. There is no prior wound history. The patient has 2 small punched out areas on the left lateral calf that have adherent debris on the surface. Past medical history; lymphedema, hypertension, cellulitis, hearing loss, morbid obesity, osteoarthritis, venous stasis, bladder CA, diverticulosis. ABIs in our clinic were 0.36 on the right and 0.57 on the left 05/24/2018; corrected age on this patient is 82 versus what I said last week. He has been for his arterial studies which predictably are not very good. On the right his ABI is 0.65. Monophasic waveforms noted at the right ankle including the posterior tibial and dorsalis pedis. He has triphasic waveforms of the common femoral and profunda femoris triphasic proximal SFA with monophasic distal FSA and popliteal artery. Monophasic tibial waveforms.  On the left his ABI is 0.58 monophasic waveforms at the left ankle. Duplex of the left lower extremity demonstrated atherosclerotic change with monophasic waveforms throughout the left lower extremity. There was some concern about left iliac disease and potentially femoral-popliteal and tibial disease. The patient does not describe claudication although according to his wife he over emphasizes his activity. Patient states he is limited by pain in both knees His wounds are on the left lateral calf. 2 small areas with necrotic debris. We have been using silver collagen and light Ace wraps 05/31/2018; wounds on the left lateral calf in the setting of very significant lymphedema and probably PAD. We have been using silver collagen. The base of the small  wound looks reasonably improved. I sent him to see Dr. Fletcher Meza however I see now he has an appointment with Dr. Alvester Meza on February 12 2/5; left lateral calf wound looks the same. His wife is doing a good job with her lymphedema wraps and maintaining that the edema. He most likely has PAD and is due to see Steven Meza of infectious disease on February 12 2/19; left lateral calf wounds look about the same. This looks like wound secondary to chronic venous insufficiency with lymphedema however the patient has very poor arterial studies. We referred him to Steven Meza. Steven Meza did not feel he needed to do anything from an arterial point of view. He did not address the question about the aggressiveness of compression. After some thoughts about this I elected to go ahead and put him in 3 layer compression which after all would be less compression then the lymphedema clinic was putting on this. I am hopeful that this should be enough to get some closure of the small wounds 2/26; left lateral calf wound letter this week. He tolerated the 3 layer compression well furthermore he is sleeping in a hospital bed which helps keep his legs up at night. The wound looks better measuring smaller especially in width 3/3; left lateral calf wound about the same size. The 3 layer compression has really helped get the edema down in his leg however. Using silver collagen 3/11; left lateral calf wound better looking wound surface but about the same size. We have been using 3 layer compression which is really helped get the edema down in the left leg. Even after Steven Meza assessment I am reluctant to go to 4 layer compression. He is tolerating 3 layer compression well. We have been using silver collagen 3/18-Patient returns for his left lateral calf wound which overall is looking better, slightly increased in size and dimensions and area, he is tolerating the 3 layer compression, has been dressed with silver collagen which we  will continue 3/25 left lateral calf wound much better looking. Size is smaller. He has been using silver collagen 4/1; gradually getting smaller in size. Using silver collagen with 3 layer compression. I think the patient has some degree of PAD. He saw Steven Meza in consultation, he did not specifically comment on whether he could tolerate 4 layer compression 4/8; the wound is slightly smaller in depth. We are using 3 layer compression with silver collagen. I had him seen for arterial insufficiency however his ABI was 0.57 in the left leg. Steven Meza really did not seem to middle on the degree of compression he could tolerate 4/15; the wound is slightly smaller and slightly less deep. We have been using 3 layer compression with silver collagen. There is not an option for 4-layer compression  here. 4/22; the patient is making decent progress on his wound on the left lateral calf however he arrives in with a superficial wound on the right anterior tibial area. Were not really sure how this happened. He had been using a stocking on the right leg. He has been using silver collagen on the wounds Amazing to me he is he appears to have compression pumps at home. I am not really sure I knew this before. In any case he has not been using them 4/29; left lateral calf wound continues to get gradually smaller. This looks like it is on its way to closure. The area on the right anterior tibial area is about the same in terms of size superficial without any depth. This was probably wrap injury He tells me that he has been using his compression pumps once a day for 1 hour without any pain 5/6; not as good today. Left lateral calf wound is actually larger and he does not have quite as good edema control. The area on the right anterior tibia is about the same still superficial. He states he is using his compression pumps once a day. We are using 3 layer compression on the left and Curlex Coban on the right 5/13; the  area on the right is very close to being healed. The area on the left still has some depth but has a healthy wound surface. He has excellent edema control bilaterally. Using [facility] Steven Meza, Steven Meza ([id number]) 5/20-Patient returns in 1 week after being in 3 layer compression on the left, the left lateral calf ulcer is stable with some slough, the right anterior tibial ulcer is healed we have been using Kerlix Coban on that leg 5/27; Hydrofera Blue and 3 layer compression. Still some slough in the wound area and some nonviable edges around the wound. 6/3; this patient has lymphedema and chronic venous insufficiency. He had difficult to close wound on his left lateral calf. This is closed today. 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 Finally he has known PAD but he is managed to tolerate the compression we reporting on him and also using his compression pumps that he already had at home twice a day 6/10; we discharge this patient last week having finally close the small area in the left lateral calf. Apparently by Friday of last week this had opened and was draining. He is therefore back in clinic. His wife who is present also was concerned about an area on the right anterior tibia."o 6/17; the patient does not have an open area on the right leg. On the left he has a small circular area anteriorly and the same area laterally that he had last time. However both of these appear to be improved he has his [place] wrap 30/40. It turns out that he is only using his external compression pumps once a day. I tried to get him up to twice a day today 6/24; the patient comes in with his external compression garment on the right leg/[place] wrap. As far as we know no open wounds here. He has 2 areas that are smaller one on the left anterior pretibial and one on the left lateral calf which is the site of his initial wounds. I have successfully caught him into his  external compression pumps twice a day 7/1; still using his external compression garment on the right leg. As far as we know there are no open wounds here. The area on  the left lateral calf which was the site of his initial wound has closed over. The area on the left anterior pretibial that we identified last week is still open. 7/8; as far as we know he has no open wounds on the right leg. He has his external compression garment on here. The original wound on the left lateral calf has closed over. He developed an area on the left anterior pretibial 2 weeks ago and seems to have 2 smaller areas on the proximal and distal lateral calf. We do not have his good edema control this week as I am used to seeing 7/15; the patient has still a linear area on the left anterior tibia. The area on the left lateral leg looked somewhat inflamed today but no cellulitis. He did have what looked to be a small pustule that I cultured for CandS although I did not give him empiric antibiotics. 7/22; small area on the left anterior tibia. The area on the left lateral leg still looks inflamed. The pustule that I cultured last week grew Enterobacter and Pseudomonas. I prescribed Cipro they still have not picked that up. In any case the area actually looks fairly satisfactory. He has decent edema control using his compression pumps twice a day 7/29; the area on the left lateral leg looks a lot better than last week. Nothing is open here. He has an area just medial to the mid anterior tibia that is still open that is only open wound today. 8/5-Patient returns at 1 week for the left leg ulcer, which is actually looking better, he is also complaining of left plantar foot pain below the great toe. He does have a hematoma here 8/12-Patient returns at 1 week, the left leg open area looks about the same if not slightly better, the left plantar hematoma under the great toe is about the same, patient apparently has not been resting  his foot on anything for too long period of time and does not wear the [employer]ken[employer] shoes unless he walks outdoors which he does not do often 8/19; small wound on the left anterior lower leg this is just about closed there is nothing on the left lateral leg. He is using his compression pumps He arrives in clinic today with a black eschar over the right first plantar metatarsal head. This was apparently a bruise that was identified by her nurses last week. He wears crocs on his feet. He also may rub these on a footboard at home 8/26; there is nothing open on the left lateral lower leg he has a small area just medial to the tibia. He is going to see his podiatrist Dr. Milinda Pointer about the area on the left plantar first metatarsal head. He is using his external compression pumps. He has a juxta lite on the right leg 9/2; there is nothing open on either side of her left lower leg. He went to see podiatry about the area on the left first metatarsal head. This was apparently debrided. He was given mupirocin and oral antibiotics. I do not believe he is offloading this at all. Readmission: 04/02/2019 upon evaluation today patient actually appears for reevaluation here in our clinic secondary to issues that he is having with his left lower extremity. He has been tolerating the dressing changes without complication. Fortunately there is no signs of active infection at this time. No fevers, chills, nausea, vomiting, or diarrhea. He has been in the hospital since we last saw him and then subsequently transferred back  to [facility]. He tells me that they have been changing his dressing once a day and that he has had a significant amount of pain secondary to undergoing these dressing changes. Fortunately there is no evidence of active infection at this point which is good news. No fevers, chills, nausea, vomiting, or diarrhea. With that being said he wonders if we can go back to doing what we were doing before which  was when he actually had the dressing changes performed once a week here in the clinic he tells me that he did very well with that. At that point he tolerated a 3 layer compression wrap which was approved by vascular, Dr. Tyrell Antonio office, to be used previous although 4-layer compression wrap is probably too much for him. 04/16/2019 on evaluation today patient appears to be doing decently well with regard to his lower extremity although he has had a lot of drainage from his leg. A lot of this I think is just secondary to the lymphedema with that being said he does have some odor as well which may again just be due to more of the excessive moisture although I think that he may also have something that is a result of bacteria again he has somewhat of a Pseudomonas-like smell to the drainage and fortunately though he is not showing any signs of severe cellulitis he is still experiencing quite a bit of drainage and the discoloration is somewhat of a yellow/green in color which is consistent with Pseudomonas. If he can tolerated I think he may do well with Cipro. 04/24/2019 on evaluation today patient appears to be doing well with regard to his wound in the gluteal region which is healing quite well. That is excellent news. With that being said he is also doing better with regard to his left lower extremity overall I feel like things are definitely showing signs of improvement. 12/28-Patient returns with regards to his gluteal wound that is improving, Left lower leg wounds with significant edema in the lower legs that appear to be persistent. Patient is in compression and has lymphedema pumps at home but apparently he does not use consistently 05/07/2019 Upon inspection patient's wound bed actually showed signs on evaluation today patient's wound currently showed signs of improvement. There is still a lot of maceration but it does look like that there is been some of this maceration occurring as a result of  drainage in particular and some as a result of the fact that they have been putting bag balm on the wound area as well as the leg in general. I think this is applying too much moisture and not doing him any good. I discussed with the patient and his wife today that I think that practice needs to be adjusted I would recommend using a different type of lotion in order to help with moisture around the wound but I would not recommend anything directly on the wound itself. 05/14/2019 on evaluation today patient actually appears to be doing much better in regard to his lower extremity. They have not been using any of the bag balm on the area I think this has made a tremendous improvement overall in his wounds. He has a lot of new skin growth and though there still are a lot of open wounds scattered throughout the circumferential lower leg this overall seems to be doing much better to me. 05/21/2019 upon evaluation today patient appears to be doing well in regard to his lower extremity although he is having  unfortunately some blue- green drainage noted at this point. The does have me concerned for a reinitiation of the Pseudomonas infection that we previously treated in Steven Meza, Steven Meza ([id number]) December. The last time I gave him a prescription was April 16, 2019. That was a little over a month ago. Nonetheless he does have more green drainage noted today and I think this is indicative of potentially a revving up infection. Obviously we do not want to get to that point. 05/28/2019 on evaluation today patient appears to be doing better with regard to his lower extremity at this point. Overall I am seeing signs of improvement which is good news. Fortunately there is no evidence of active infection at this time. I do believe that the Cipro has been beneficial for him that was prescribed last week for 14 days he is halfway through that and already appears to be doing much better. This is excellent  news. 06/04/2019 upon evaluation today I do believe that the patient is doing better with regard to his lower extremity. I feel like that he has much less in the way of open wounds and drainage and much in provement in regard to the overall weeping and even his swelling. There is no signs of erythema no obvious evidence of active infection all of which is great news. No fevers, chills, nausea, vomiting, or diarrhea. 06/11/2019 on evaluation today patient appears to be doing better with regard to his lower extremity on the left. He has been tolerating dressing changes without complication. Fortunately there is no signs of active infection at this time. No fevers, chills, nausea, vomiting, or diarrhea. 06/18/2019 upon evaluation today patient appears to be doing slightly worse compared to last week. They did call me on Friday due to the fact that he had green discharge and both his wife as well as his home health nurse were concerned about what they were seeing. Subsequently I did actually send in a prescription for Cipro for 14 days for him and according to his wife this seems to be doing better based on what she is seeing today compared to Friday. This is great news. Home health is still coming out to see him on Wednesdays and Fridays as well. 06/25/2019 upon evaluation today patient appears to be doing somewhat better in regard to his leg ulcer. Fortunately there is no signs of active infection at this time which is good news. With that being said I am very pleased with how things seem to be going and overall I see no major issues here I think the alginate is doing well along with the XtraSorb. With regard to the patient's plantar foot he does have somewhat of a blood blister that appears to be resolving at this point his wife states that she feels like the issue is that he is not dropping his shoe in order to keep it from sliding when he walks. Subsequently this is leading to increases in friction which of  course is leading to more issues with getting this area to heal the patient is struggling in that regard. 07/02/2019 upon evaluation today patient appears to be making some progress in regard to his lower extremities. With that being said I really feel like the alginate may be getting too moist and then sitting on the skin causing some complications here for [place]. I discussed with the patient and his wife today as well the possibility of going ahead and discontinuing the alginate just utilizing the Xtrasorb to try to keep  the edema under better control while at the same time preventing any dressings from staying too moist right on the surface of the wounds. Obviously I think this can do a good job in helping to dry things out but I would recommend that we give this a shot. 07/09/2019 upon evaluation today patient appears to be doing better in regard to some of the edema and weeping in regard to the left lower extremity. Unfortunately he has not been wearing his compression of the right lower extremity and now that is open as well. He needs to obviously be wearing this although for today we will get a half to wrap him due to the fact that this is now open. I explained that he does need to be wearing his compression at all times. 07/16/2019 upon evaluation today patient actually does appear to be making some progress in regard to his lower extremity ulcers at this point. In fact he really does not have any ulcerations as much as he just does have lymphedema breakdown causing weeping. Nonetheless he is drying up quite a bit but overall seems to be doing much better. He has a lot of dry flaky skin I think we may want to add triamcinolone to the skin to try to help out with cutting back on some of the irritation here hopefully improve the overall skin quality. He does have Velcro compression wraps once he heals that is good news as well. 07/23/2019 upon evaluation today patient appears to be doing better with  regard to his bilateral lower extremities. I think we are getting closer to getting everything sealed up. Again he does not have any significant wounds and his lymphedema has dramatically improved. 07/30/2019 upon evaluation today patient appears to be doing excellent in regard to his bilateral lower extremities. Fortunately there is no signs of active infection at this time. Overall the patient seems to be in fact completely sealed up as far as any areas of weeping or concern which also has me thrilled based on what we are seeing currently. Overall I think that he is doing extremely well. 09/03/2019 upon evaluation today patient appears to be doing somewhat poorly in regard to his left lower extremity with regard to open wounds. Fortunately there is nothing that appears to be draining too terribly although he has about 3 different areas that are open as far as wounds are concerned all 3 seem to be fairly superficial. He was discharged from our clinic on 07/30/2019. Subsequently he reopened in the interim apparently at home health never completely discharged home from being seen. They in fact have still continue to wrap his left leg up until now. 09/10/2019 upon evaluation today patient actually appears to be doing better with regard to his lower extremities. Fortunately there is no signs of active infection at this time. No fevers, chills, nausea, vomiting, or diarrhea. Overall been fairly pleased with how things seem to be progressing at this point. The patient is likewise happy that his legs look better. He tells me he has been elevating more. With that being said he does not really commit to exactly how much more and his wife tells me he still spends a lot of time with his feet on the ground. 5/19; have not seen this patient in quite some time. He had wounds on his left lower extremity. These are all healed today. He has juxta lites for both legs. He has 1 on the right leg it looks as though he has been  putting this on well. His wife emphasizes that he lotions his own legs and puts on his own stocking. He also has external compression pumps that he uses twice a day Objective Constitutional Patient is hypertensive.. Pulse regular and within target range for patient.Marland Kitchen Respirations regular, non-labored and within target range.. Temperature is normal and within the target range for the patient.Marland Kitchen appears in no distress. Vitals Time Taken: 2:40 PM, Height: 65 in, Weight: 273 lbs, BMI: 45.4, Temperature: 98.6 F, Pulse: 62 bpm, Respiratory Rate: 18 breaths/min, Burright, Maxximus A. ([id number]) Blood Pressure: 140/50 mmHg. Cardiovascular Pedal pulses are palpable. Edema control is adequate. General Notes: Wound exam; the patient's left leg are dry with flaking skin on the left leg although there is no open area that I can find. Integumentary (Hair, Skin) Wound #10 status is Open. Original cause of wound was Gradually Appeared. The wound is located on the Left,Distal,Medial Lower Leg. The wound measures 0cm length x 0cm width x 0cm depth; 0cm^2 area and 0cm^3 volume. There is a none present amount of drainage noted. The wound margin is flat and intact. There is no granulation within the wound bed. There is no necrotic tissue within the wound bed. Wound #8 status is Open. Original cause of wound was Gradually Appeared. The wound is located on the Left,Anterior Lower Leg. The wound measures 0cm length x 0cm width x 0cm depth; 0cm^2 area and 0cm^3 volume. There is a none present amount of drainage noted. The wound margin is flat and intact. There is no granulation within the wound bed. There is no necrotic tissue within the wound bed. Wound #9 status is Open. Original cause of wound was Gradually Appeared. The wound is located on the Left,Proximal,Medial Lower Leg. The wound measures 0cm length x 0cm width x 0cm depth; 0cm^2 area and 0cm^3 volume. There is no tunneling or undermining noted. There is a none  present amount of drainage noted. The wound margin is flat and intact. There is no granulation within the wound bed. There is no necrotic tissue within the wound bed. Assessment Active Problems ICD-10 Lymphedema, not elsewhere classified Venous insufficiency (chronic) (peripheral) Non-pressure chronic ulcer of other part of left lower leg with fat layer exposed Essential (primary) hypertension Atherosclerotic heart disease of native coronary artery without angina pectoris Corns and callosities Plan Edema Control: Patient to wear own Velcro compression garment. Elevate legs to the level of the heart and pump ankles as often as possible Compression Pump: Use compression pump on left lower extremity for 60 minutes, twice daily. Compression Pump: Use compression pump on right lower extremity for 60 minutes, twice daily. Discharge From [facility] Services: Discharge from [facility] - treatment complete 1. The patient can be discharged from the wound care center 2. Caution to lubricate the skin in the left leg uses juxta lite stocking putting it on first thing in the morning. Also to use the external compression pumps twice a day all of which she is already doing on the right side. 3. We have had trouble with recidivism in this patient however he appears to be doing everything right as verified by his wife Electronic Signature(s) Signed: 09/19/2019 5:06:12 PM By: Linton Ham MD Entered By: Linton Ham on 09/19/2019 16:00:27 Steven Meza ([id number]) -------------------------------------------------------------------------------- SuperBill Details Patient Name: Steven Meza Date of Service: 09/19/2019 Medical Record Number: [id number] Patient Account Number: [id number] Date of Birth/Sex: 05/19/1930 (86 y.o. M) Treating RN: Steven Meza Primary Care Provider: Fulton Meza Other Clinician: Referring Provider: Doy Hutching,  Dellis Filbert Treating Provider/Extender: Steven Meza  in Treatment: 24 Diagnosis Coding ICD-10 Codes Meza Description I89.0 Lymphedema, not elsewhere classified I87.2 Venous insufficiency (chronic) (peripheral) L97.822 Non-pressure chronic ulcer of other part of left lower leg with fat layer exposed I10 Essential (primary) hypertension I25.10 Atherosclerotic heart disease of native coronary artery without angina pectoris L84 Corns and callosities Facility Procedures CPT4 Meza: [id number] Description: [phone] - WOUND CARE VISIT-LEV 2 EST PT Modifier: Quantity: 1 Physician Procedures CPT4 Meza: [id number] Description: [id number] - WC PHYS LEVEL 3 - EST PT Modifier: Quantity: 1 CPT4 Meza: Description: ICD-10 Diagnosis Description L97.822 Non-pressure chronic ulcer of other part of left lower leg with fat lay I89.0 Lymphedema, not elsewhere classified I87.2 Venous insufficiency (chronic) (peripheral) Modifier: er exposed Quantity: Electronic Signature(s) Signed: 09/19/2019 5:06:12 PM By: Linton Ham MD Entered By: Linton Ham on 09/19/2019 16:00:52

## 2019-09-24 ENCOUNTER — Ambulatory Visit: Payer: Medicare Other | Admitting: Physician Assistant

## 2019-09-25 ENCOUNTER — Ambulatory Visit: Payer: Medicare Other | Admitting: Physician Assistant

## 2020-01-14 ENCOUNTER — Other Ambulatory Visit: Payer: Self-pay

## 2020-01-14 ENCOUNTER — Encounter: Payer: Self-pay | Admitting: Podiatry

## 2020-01-14 ENCOUNTER — Ambulatory Visit (INDEPENDENT_AMBULATORY_CARE_PROVIDER_SITE_OTHER): Payer: Medicare Other | Admitting: Podiatry

## 2020-01-14 DIAGNOSIS — M79674 Pain in right toe(s): Secondary | ICD-10-CM | POA: Diagnosis not present

## 2020-01-14 DIAGNOSIS — M79675 Pain in left toe(s): Secondary | ICD-10-CM

## 2020-01-14 DIAGNOSIS — B351 Tinea unguium: Secondary | ICD-10-CM

## 2020-01-14 NOTE — Progress Notes (Signed)
He presents today with chief complaint of painfully elongated toenails.  Objective: Vital signs are stable he is alert oriented x3 presents with his wife.  Pulses are palpable he has his legs wrapped with his compression devices.  And he does not demonstrate any signs of distress.  No open lesions or wounds.  Nails are long thick yellow dystrophic and clinically mycotic.  Assessment: Pain in limb secondary to onychomycosis.  Plan: Debridement of toenails 1 through 5 bilateral cover service secondary to pain no iatrogenic esions noted.

## 2020-04-17 ENCOUNTER — Ambulatory Visit: Payer: Medicare Other | Admitting: Podiatry

## 2020-05-05 ENCOUNTER — Ambulatory Visit: Payer: Medicare Other | Admitting: Podiatry

## 2020-05-12 ENCOUNTER — Encounter: Payer: Self-pay | Admitting: Podiatry

## 2020-05-12 ENCOUNTER — Ambulatory Visit (INDEPENDENT_AMBULATORY_CARE_PROVIDER_SITE_OTHER): Payer: Medicare Other | Admitting: Podiatry

## 2020-05-12 ENCOUNTER — Other Ambulatory Visit: Payer: Self-pay

## 2020-05-12 DIAGNOSIS — B351 Tinea unguium: Secondary | ICD-10-CM | POA: Diagnosis not present

## 2020-05-12 DIAGNOSIS — M79675 Pain in left toe(s): Secondary | ICD-10-CM

## 2020-05-12 DIAGNOSIS — M79674 Pain in right toe(s): Secondary | ICD-10-CM | POA: Diagnosis not present

## 2020-05-12 DIAGNOSIS — I878 Other specified disorders of veins: Secondary | ICD-10-CM

## 2020-05-12 NOTE — Progress Notes (Signed)
This patient returns to my office for at risk foot care.  This patient requires this care by a professional since this patient will be at risk due to having lymphedema and venous stasis. This patient is unable to cut nails himself since the patient cannot reach his nails.These nails are painful walking and wearing shoes.  This patient presents for at risk foot care today.  General Appearance  Alert, conversant and in no acute stress.  Vascular  Dorsalis pedis and posterior tibial  pulses are not  Palpable due to swelling  bilaterally.  Capillary return is within normal limits  Bilaterally.Venous stasis   bilaterally.  Neurologic  Senn-Weinstein monofilament wire test within normal limits  bilaterally. Muscle power within normal limits bilaterally.  Nails Thick disfigured discolored nails with subungual debris  from hallux to fifth toes bilaterally. No evidence of bacterial infection or drainage bilaterally.  Orthopedic  No limitations of motion  feet .  No crepitus or effusions noted.  No bony pathology or digital deformities noted.  Skin  normotropic skin with no porokeratosis noted bilaterally.  No signs of infections or ulcers noted.     Onychomycosis  Pain in right toes  Pain in left toes  Consent was obtained for treatment procedures.   Mechanical debridement of nails 1-5  bilaterally performed with a nail nipper.  Filed with dremel without incident.    Return office visit    4 months                  Told patient to return for periodic foot care and evaluation due to potential at risk complications.   Gardiner Barefoot DPM

## 2020-09-11 ENCOUNTER — Other Ambulatory Visit: Payer: Self-pay

## 2020-09-11 ENCOUNTER — Encounter: Payer: Self-pay | Admitting: Podiatry

## 2020-09-11 ENCOUNTER — Ambulatory Visit (INDEPENDENT_AMBULATORY_CARE_PROVIDER_SITE_OTHER): Payer: Medicare Other | Admitting: Podiatry

## 2020-09-11 DIAGNOSIS — B351 Tinea unguium: Secondary | ICD-10-CM | POA: Diagnosis not present

## 2020-09-11 DIAGNOSIS — I878 Other specified disorders of veins: Secondary | ICD-10-CM | POA: Diagnosis not present

## 2020-09-11 DIAGNOSIS — M79674 Pain in right toe(s): Secondary | ICD-10-CM

## 2020-09-11 DIAGNOSIS — N179 Acute kidney failure, unspecified: Secondary | ICD-10-CM | POA: Diagnosis not present

## 2020-09-11 DIAGNOSIS — M79675 Pain in left toe(s): Secondary | ICD-10-CM

## 2020-09-11 NOTE — Progress Notes (Signed)
This patient returns to my office for at risk foot care.  This patient requires this care by a professional since this patient will be at risk due to having lymphedema and venous stasis.  This patient is unable to cut nails himself since the patient cannot reach his nails.These nails are painful walking and wearing shoes. Patient presents to the office with his wife or male caregiver. This patient presents for at risk foot care today.  General Appearance  Alert, conversant and in no acute stress.  Vascular  Dorsalis pedis and posterior tibial  pulses are not  Palpable due to swelling  bilaterally.  Capillary return is within normal limits  Bilaterally.Venous stasis   bilaterally.  Neurologic  Senn-Weinstein monofilament wire test within normal limits  bilaterally. Muscle power within normal limits bilaterally.  Nails Thick disfigured discolored nails with subungual debris  from hallux to fifth toes bilaterally. No evidence of bacterial infection or drainage bilaterally.  Orthopedic  No limitations of motion  feet .  No crepitus or effusions noted.  No bony pathology or digital deformities noted.  Skin  normotropic skin with no porokeratosis noted bilaterally.  No signs of infections or ulcers noted. Red/purple feet noted due to venous congestion.   Onychomycosis  Pain in right toes  Pain in left toes  Consent was obtained for treatment procedures.   Mechanical debridement of nails 1-5  bilaterally performed with a nail nipper.  Filed with dremel without incident.  Found insole for right foot only. To make an appointment with [facility] for shoes even though he is not diabetic.   Return office visit    3 months                  Told patient to return for periodic foot care and evaluation due to potential at risk complications.   Gardiner Barefoot DPM

## 2020-09-15 ENCOUNTER — Other Ambulatory Visit
Admission: RE | Admit: 2020-09-15 | Discharge: 2020-09-15 | Disposition: A | Discharge status: Home / Self Care | Payer: Medicare Other | Source: Ambulatory Visit | Attending: Physician Assistant | Admitting: Physician Assistant

## 2020-09-15 ENCOUNTER — Encounter: Payer: Medicare Other | Attending: Physician Assistant | Admitting: Physician Assistant

## 2020-09-15 ENCOUNTER — Other Ambulatory Visit: Payer: Self-pay

## 2020-09-15 DIAGNOSIS — L97219 Non-pressure chronic ulcer of right calf with unspecified severity: Secondary | ICD-10-CM | POA: Diagnosis present

## 2020-09-15 DIAGNOSIS — I10 Essential (primary) hypertension: Secondary | ICD-10-CM | POA: Insufficient documentation

## 2020-09-15 DIAGNOSIS — L089 Local infection of the skin and subcutaneous tissue, unspecified: Secondary | ICD-10-CM | POA: Insufficient documentation

## 2020-09-15 DIAGNOSIS — L97929 Non-pressure chronic ulcer of unspecified part of left lower leg with unspecified severity: Secondary | ICD-10-CM | POA: Insufficient documentation

## 2020-09-16 NOTE — Progress Notes (Signed)
TARRIS, BHAT ([id number]) Visit Report for 09/15/2020 Chief Complaint Document Details Patient Name: Steven Meza, Steven Meza Date of Service: 09/15/2020 2:30 PM Medical Record Number: [id number] Patient Account Number: [id number] Date of Birth/Sex: 01-Nov-1930 (87 y.o. M) Treating RN: Donnamarie Poag Primary Care Provider: Fulton Reek Other Clinician: Referring Provider: Referral, Self Treating Provider/Extender: Skipper Cliche in Treatment: 0 Information Obtained from: Patient Chief Complaint Bilateral LE Lymphedema and Cellulitis Electronic Signature(s) Signed: 09/15/2020 3:34:15 PM By: Worthy Keeler PA-C Entered By: Worthy Keeler on 09/15/2020 15:34:15 Steven Meza ([id number]) -------------------------------------------------------------------------------- HPI Details Patient Name: Steven Meza Date of Service: 09/15/2020 2:30 PM Medical Record Number: [id number] Patient Account Number: [id number] Date of Birth/Sex: 09/08/1930 (87 y.o. M) Treating RN: Donnamarie Poag Primary Care Provider: Fulton Reek Other Clinician: Referring Provider: Referral, Self Treating Provider/Extender: Skipper Cliche in Treatment: 0 History of Present Illness HPI Description: ADMISSION 05/17/2018 Mr. Dilello is an 87 year old man who is been treated at the lymphedema clinic for a long period with lymphedema wraps for bilateral lower extremity lymphedema. About 6 months ago they noted small open areas on his left lateral calf just above the ankle. These were open. They are apparently putting some form of ointment on this. They have been referred here for our review of this. The patient has a long history of lymphedema and venous stasis. It says in his records that he has recurrent cellulitis although his wife denies this but she states that the lymphedema may have started with cellulitis several years ago. Also in his record there is a history of bladder cancer which they seem to know very  little about however he did not have any radiation to the pelvis or anything that could have contributed to lymphedema that they are aware of. There is no prior wound history. The patient has 2 small punched out areas on the left lateral calf that have adherent debris on the surface. Past medical history; lymphedema, hypertension, cellulitis, hearing loss, morbid obesity, osteoarthritis, venous stasis, bladder CA, diverticulosis. ABIs in our clinic were 0.36 on the right and 0.57 on the left 05/24/2018; corrected age on this patient is 84 versus what I said last week. He has been for his arterial studies which predictably are not very good. On the right his ABI is 0.65. Monophasic waveforms noted at the right ankle including the posterior tibial and dorsalis pedis. He has triphasic waveforms of the common femoral and profunda femoris triphasic proximal SFA with monophasic distal FSA and popliteal artery. Monophasic tibial waveforms. On the left his ABI is 0.58 monophasic waveforms at the left ankle. Duplex of the left lower extremity demonstrated atherosclerotic change with monophasic waveforms throughout the left lower extremity. There was some concern about left iliac disease and potentially femoral-popliteal and tibial disease. The patient does not describe claudication although according to his wife he over emphasizes his activity. Patient states he is limited by pain in both knees His wounds are on the left lateral calf. 2 small areas with necrotic debris. We have been using silver collagen and light Ace wraps 05/31/2018; wounds on the left lateral calf in the setting of very significant lymphedema and probably PAD. We have been using silver collagen. The base of the small wound looks reasonably improved. I sent him to see Dr. Fletcher Anon however I see now he has an appointment with Dr. Alvester Chou on February 12 2/5; left lateral calf wound looks the same. His wife is doing a good job with her lymphedema  wraps  and maintaining that the edema. He most likely has PAD and is due to see Dr. Gwenlyn Found of infectious disease on February 12 2/19; left lateral calf wounds look about the same. This looks like wound secondary to chronic venous insufficiency with lymphedema however the patient has very poor arterial studies. We referred him to Dr. Gwenlyn Found. Dr. Gwenlyn Found did not feel he needed to do anything from an arterial point of view. He did not address the question about the aggressiveness of compression. After some thoughts about this I elected to go ahead and put him in 3 layer compression which after all would be less compression then the lymphedema clinic was putting on this. I am hopeful that this should be enough to get some closure of the small wounds 2/26; left lateral calf wound letter this week. He tolerated the 3 layer compression well furthermore he is sleeping in a hospital bed which helps keep his legs up at night. The wound looks better measuring smaller especially in width 3/3; left lateral calf wound about the same size. The 3 layer compression has really helped get the edema down in his leg however. Using silver collagen 3/11; left lateral calf wound better looking wound surface but about the same size. We have been using 3 layer compression which is really helped get the edema down in the left leg. Even after Dr. Kennon Holter assessment I am reluctant to go to 4 layer compression. He is tolerating 3 layer compression well. We have been using silver collagen 3/18-Patient returns for his left lateral calf wound which overall is looking better, slightly increased in size and dimensions and area, he is tolerating the 3 layer compression, has been dressed with silver collagen which we will continue 3/25oleft lateral calf wound much better looking. Size is smaller. He has been using silver collagen 4/1; gradually getting smaller in size. Using silver collagen with 3 layer compression. I think the patient has  some degree of PAD. He saw Dr. Gwenlyn Found in consultation, he did not specifically comment on whether he could tolerate 4 layer compression 4/8; the wound is slightly smaller in depth. We are using 3 layer compression with silver collagen. I had him seen for arterial insufficiency however his ABI was 0.57 in the left leg. Dr. Gwenlyn Found really did not seem to middle on the degree of compression he could tolerate 4/15; the wound is slightly smaller and slightly less deep. We have been using 3 layer compression with silver collagen. There is not an option for 4-layer compression here. 4/22; the patient is making decent progress on his wound on the left lateral calf however he arrives in with a superficial wound on the right anterior tibial area. Were not really sure how this happened. He had been using a stocking on the right leg. He has been using silver collagen on the wounds Amazing to me he is he appears to have compression pumps at home. I am not really sure I knew this before. In any case he has not been using them 4/29; left lateral calf wound continues to get gradually smaller. This looks like it is on its way to closure. oThe area on the right anterior tibial area is about the same in terms of size superficial without any depth. This was probably wrap injury oHe tells me that he has been using his compression pumps once a day for 1 hour without any pain 5/6; not as good today. Left lateral calf wound is actually larger and he does not  have quite as good edema control. The area on the right anterior tibia is about the same still superficial. He states he is using his compression pumps once a day. We are using 3 layer compression on the left and Curlex Coban on the right 5/13; the area on the right is very close to being healed. The area on the left still has some depth but has a healthy wound surface. He has excellent edema control bilaterally. Using [facility] TENOCH, LOWARY  ([id number]) 5/20-Patient returns in 1 week after being in 3 layer compression on the left, the left lateral calf ulcer is stable with some slough, the right anterior tibial ulcer is healed we have been using Kerlix Coban on that leg 5/27; Hydrofera Blue and 3 layer compression. Still some slough in the wound area and some nonviable edges around the wound. 6/3; this patient has lymphedema and chronic venous insufficiency. He had difficult to close wound on his left lateral calf. This is closed today. 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 Finally he has known PAD but he is managed to tolerate the compression we reporting on him and also using his compression pumps that he already had at home twice a day 6/10; we discharge this patient last week having finally close the small area in the left lateral calf. Apparently by Friday of last week this had opened and was draining. He is therefore back in clinic. His wife who is present also was concerned about an area on the right anterior tibia."o 6/17; the patient does not have an open area on the right leg. On the left he has a small circular area anteriorly and the same area laterally that he had last time. However both of these appear to be improved he has his Farrow wrap 30/40. It turns out that he is only using his external compression pumps once a day. I tried to get him up to twice a day today 6/24; the patient comes in with his external compression garment on the right leg/Farrow wrap. As far as we know no open wounds here. He has 2 areas that are smaller one on the left anterior pretibial and one on the left lateral calf which is the site of his initial wounds. I have successfully caught him into his external compression pumps twice a day 7/1; still using his external compression garment on the right leg. As far as we know there are no open wounds here. The area on the left lateral calf which was the site of  his initial wound has closed over. The area on the left anterior pretibial that we identified last week is still open. 7/8; as far as we know he has no open wounds on the right leg. He has his external compression garment on here. The original wound on the left lateral calf has closed over. He developed an area on the left anterior pretibial 2 weeks ago and seems to have 2 smaller areas on the proximal and distal lateral calf. We do not have his good edema control this week as I am used to seeing 7/15; the patient has still a linear area on the left anterior tibia. The area on the left lateral leg looked somewhat inflamed today but no cellulitis. He did have what looked to be a small pustule that I cultured for CandS although I did not give him empiric antibiotics. 7/22; small area on the left anterior tibia. The area on the  left lateral leg still looks inflamed. The pustule that I cultured last week grew Enterobacter and Pseudomonas. I prescribed Cipro they still have not picked that up. In any case the area actually looks fairly satisfactory. He has decent edema control using his compression pumps twice a day 7/29; the area on the left lateral leg looks a lot better than last week. Nothing is open here. He has an area just medial to the mid anterior tibia that is still open that is only open wound today. 8/5-Patient returns at 1 week for the left leg ulcer, which is actually looking better, he is also complaining of left plantar foot pain below the great toe. He does have a hematoma here 8/12-Patient returns at 1 week, the left leg open area looks about the same if not slightly better, the left plantar hematoma under the great toe is about the same, patient apparently has not been resting his foot on anything for too long period of time and does not wear the [employer]kenstock shoes unless he walks outdoors which he does not do often 8/19; small wound on the left anterior lower leg this is just about  closed there is nothing on the left lateral leg. He is using his compression pumps He arrives in clinic today with a black eschar over the right first plantar metatarsal head. This was apparently a bruise that was identified by her nurses last week. He wears crocs on his feet. He also may rub these on a footboard at home 8/26; there is nothing open on the left lateral lower leg he has a small area just medial to the tibia. He is going to see his podiatrist Dr. Milinda Pointer about the area on the left plantar first metatarsal head. He is using his external compression pumps. He has a juxta lite on the right leg 9/2; there is nothing open on either side of her left lower leg. He went to see podiatry about the area on the left first metatarsal head. This was apparently debrided. He was given mupirocin and oral antibiotics. I do not believe he is offloading this at all. Readmission: 04/02/2019 upon evaluation today patient actually appears for reevaluation here in our clinic secondary to issues that he is having with his left lower extremity. He has been tolerating the dressing changes without complication. Fortunately there is no signs of active infection at this time. No fevers, chills, nausea, vomiting, or diarrhea. He has been in the hospital since we last saw him and then subsequently transferred back to [facility]. He tells me that they have been changing his dressing once a day and that he has had a significant amount of pain secondary to undergoing these dressing changes. Fortunately there is no evidence of active infection at this point which is good news. No fevers, chills, nausea, vomiting, or diarrhea. With that being said he wonders if we can go back to doing what we were doing before which was when he actually had the dressing changes performed once a week here in the clinic he tells me that he did very well with that. At that point he tolerated a 3 layer compression wrap which was approved by  vascular, Dr. Tyrell Antonio office, to be used previous although 4-layer compression wrap is probably too much for him. 04/16/2019 on evaluation today patient appears to be doing decently well with regard to his lower extremity although he has had a lot of drainage from his leg. A lot of this I think is just  secondary to the lymphedema with that being said he does have some odor as well which may again just be due to more of the excessive moisture although I think that he may also have something that is a result of bacteria again he has somewhat of a Pseudomonas-like smell to the drainage and fortunately though he is not showing any signs of severe cellulitis he is still experiencing quite a bit of drainage and the discoloration is somewhat of a yellow/green in color which is consistent with Pseudomonas. If he can tolerated I think he may do well with Cipro. 04/24/2019 on evaluation today patient appears to be doing well with regard to his wound in the gluteal region which is healing quite well. That is excellent news. With that being said he is also doing better with regard to his left lower extremity overall I feel like things are definitely showing signs of improvement. 12/28-Patient returns with regards to his gluteal wound that is improving, Left lower leg wounds with significant edema in the lower legs that appear to be persistent. Patient is in compression and has lymphedema pumps at home but apparently he does not use consistently 05/07/2019 Upon inspection patient's wound bed actually showed signs on evaluation today patient's wound currently showed signs of improvement. There is still a lot of maceration but it does look like that there is been some of this maceration occurring as a result of drainage in particular and some as a result of the fact that they have been putting bag balm on the wound area as well as the leg in general. I think this is applying too much moisture and not doing him any  good. I discussed with the patient and his wife today that I think that practice needs to be adjusted I would recommend using a different type of lotion in order to help with moisture around the wound but I would not recommend anything directly on the wound itself. 05/14/2019 on evaluation today patient actually appears to be doing much better in regard to his lower extremity. They have not been using any of the bag balm on the area I think this has made a tremendous improvement overall in his wounds. He has a lot of new skin growth and though there still are a lot of open wounds scattered throughout the circumferential lower leg this overall seems to be doing much better to me. 05/21/2019 upon evaluation today patient appears to be doing well in regard to his lower extremity although he is having unfortunately some blue- green drainage noted at this point. The does have me concerned for a reinitiation of the Pseudomonas infection that we previously treated in [place], [place] ([id number]) December. The last time I gave him a prescription was April 16, 2019. That was a little over a month ago. Nonetheless he does have more green drainage noted today and I think this is indicative of potentially a revving up infection. Obviously we do not want to get to that point. 05/28/2019 on evaluation today patient appears to be doing better with regard to his lower extremity at this point. Overall I am seeing signs of improvement which is good news. Fortunately there is no evidence of active infection at this time. I do believe that the Cipro has been beneficial for him that was prescribed last week for 14 days he is halfway through that and already appears to be doing much better. This is excellent news. 06/04/2019 upon evaluation today I do believe that the  patient is doing better with regard to his lower extremity. I feel like that he has much less in the way of open wounds and drainage and much in provement in  regard to the overall weeping and even his swelling. There is no signs of erythema no obvious evidence of active infection all of which is great news. No fevers, chills, nausea, vomiting, or diarrhea. 06/11/2019 on evaluation today patient appears to be doing better with regard to his lower extremity on the left. He has been tolerating dressing changes without complication. Fortunately there is no signs of active infection at this time. No fevers, chills, nausea, vomiting, or diarrhea. 06/18/2019 upon evaluation today patient appears to be doing slightly worse compared to last week. They did call me on Friday due to the fact that he had green discharge and both his wife as well as his home health nurse were concerned about what they were seeing. Subsequently I did actually send in a prescription for Cipro for 14 days for him and according to his wife this seems to be doing better based on what she is seeing today compared to Friday. This is great news. Home health is still coming out to see him on Wednesdays and Fridays as well. 06/25/2019 upon evaluation today patient appears to be doing somewhat better in regard to his leg ulcer. Fortunately there is no signs of active infection at this time which is good news. With that being said I am very pleased with how things seem to be going and overall I see no major issues here I think the alginate is doing well along with the XtraSorb. With regard to the patient's plantar foot he does have somewhat of a blood blister that appears to be resolving at this point his wife states that she feels like the issue is that he is not dropping his shoe in order to keep it from sliding when he walks. Subsequently this is leading to increases in friction which of course is leading to more issues with getting this area to heal the patient is struggling in that regard. 07/02/2019 upon evaluation today patient appears to be making some progress in regard to his lower extremities.  With that being said I really feel like the alginate may be getting too moist and then sitting on the skin causing some complications here for [place]. I discussed with the patient and his wife today as well the possibility of going ahead and discontinuing the alginate just utilizing the Xtrasorb to try to keep the edema under better control while at the same time preventing any dressings from staying too moist right on the surface of the wounds. Obviously I think this can do a good job in helping to dry things out but I would recommend that we give this a shot. 07/09/2019 upon evaluation today patient appears to be doing better in regard to some of the edema and weeping in regard to the left lower extremity. Unfortunately he has not been wearing his compression of the right lower extremity and now that is open as well. He needs to obviously be wearing this although for today we will get a half to wrap him due to the fact that this is now open. I explained that he does need to be wearing his compression at all times. 07/16/2019 upon evaluation today patient actually does appear to be making some progress in regard to his lower extremity ulcers at this point. In fact he really does not have any ulcerations  as much as he just does have lymphedema breakdown causing weeping. Nonetheless he is drying up quite a bit but overall seems to be doing much better. He has a lot of dry flaky skin I think we may want to add triamcinolone to the skin to try to help out with cutting back on some of the irritation here hopefully improve the overall skin quality. He does have Velcro compression wraps once he heals that is good news as well. 07/23/2019 upon evaluation today patient appears to be doing better with regard to his bilateral lower extremities. I think we are getting closer to getting everything sealed up. Again he does not have any significant wounds and his lymphedema has dramatically improved. 07/30/2019 upon  evaluation today patient appears to be doing excellent in regard to his bilateral lower extremities. Fortunately there is no signs of active infection at this time. Overall the patient seems to be in fact completely sealed up as far as any areas of weeping or concern which also has me thrilled based on what we are seeing currently. Overall I think that he is doing extremely well. 09/03/2019 upon evaluation today patient appears to be doing somewhat poorly in regard to his left lower extremity with regard to open wounds. Fortunately there is nothing that appears to be draining too terribly although he has about 3 different areas that are open as far as wounds are concerned all 3 seem to be fairly superficial. He was discharged from our clinic on 07/30/2019. Subsequently he reopened in the interim apparently at home health never completely discharged home from being seen. They in fact have still continue to wrap his left leg up until now. 09/10/2019 upon evaluation today patient actually appears to be doing better with regard to his lower extremities. Fortunately there is no signs of active infection at this time. No fevers, chills, nausea, vomiting, or diarrhea. Overall been fairly pleased with how things seem to be progressing at this point. The patient is likewise happy that his legs look better. He tells me he has been elevating more. With that being said he does not really commit to exactly how much more and his wife tells me he still spends a lot of time with his feet on the ground. 5/19; have not seen this patient in quite some time. He had wounds on his left lower extremity. These are all healed today. He has juxta lites for both legs. He has 1 on the right leg it looks as though he has been putting this on well. His wife emphasizes that he lotions his own legs and puts on his own stocking. He also has external compression pumps that he uses twice a day Readmission: 09/15/2020 upon evaluation today  patient appears to be doing somewhat poorly in regard to his bilateral lower extremities. He seems to likely have cellulitis. He is having weeping and drainage due to lymphedema but I do not see any evidence of actual systemic infection at this point which is great news. With that being said I do feel like that this is likely continue to be addressed with some oral antibiotics at minimum. If he gets any worse he is probably need to go to the hospital. The patient's ABI when last checked by Dr. Alvester Chou was on the right 0.65 and the left 0.58 but it was felt that he could tolerate compression and subsequently he is always tolerated the 3 layer compression wrap. Again he does have a history of lymphedema, chronic venous  insufficiency, hypertension, coronary artery disease, and callus buildup on his feet. Electronic Signature(s) Signed: 09/15/2020 4:44:57 PM By: Worthy Keeler PA-C Entered By: Worthy Keeler on 09/15/2020 16:44:57 Steven Meza ([id number]) -------------------------------------------------------------------------------- Physical Exam Details Patient Name: Steven Meza Date of Service: 09/15/2020 2:30 PM Medical Record Number: [id number] Patient Account Number: [id number] Date of Birth/Sex: 1931/04/17 (87 y.o. M) Treating RN: Donnamarie Poag Primary Care Provider: Fulton Reek Other Clinician: Referring Provider: Referral, Self Treating Provider/Extender: Skipper Cliche in Treatment: 0 Constitutional patient is hypertensive.. pulse regular and within target range for patient.Marland Kitchen respirations regular, non-labored and within target range for patient.Marland Kitchen temperature within target range for patient.. Well-nourished and well-hydrated in no acute distress. Eyes conjunctiva clear no eyelid edema noted. pupils equal round and reactive to light and accommodation. Ears, Nose, Mouth, and Throat no gross abnormality of ear auricles or external auditory canals. normal hearing noted during  conversation. mucus membranes moist. Respiratory normal breathing without difficulty. Cardiovascular 1+ dorsalis pedis/posterior tibialis pulses. Musculoskeletal Patient unable to walk without assistance. no significant deformity or arthritic changes, no loss or range of motion, no clubbing. Psychiatric this patient is able to make decisions and demonstrates good insight into disease process. Alert and Oriented x 3. pleasant and cooperative. Notes Upon inspection patient has bilateral lower extremity cellulitis and edema. He does have stage III lymphedema unfortunately. With that being said I do believe that he is going require some antibiotics to try to get some things under control here. I think Bactrim is probably the best option for him and I Georgina Peer go ahead and see about sending that into the pharmacy currently. Nonetheless I think that if anything is getting worse or not improving he is probably getting need to go to the ER for further evaluation and treatment especially if he develops any fevers, chills, nausea, vomiting, or diarrhea all of which was discussed with the patient and his wife today. Electronic Signature(s) Signed: 09/15/2020 4:45:52 PM By: Worthy Keeler PA-C Entered By: Worthy Keeler on 09/15/2020 16:45:52 Eulas Post Epifania Gore ([id number]) -------------------------------------------------------------------------------- Physician Orders Details Patient Name: Steven Meza Date of Service: 09/15/2020 2:30 PM Medical Record Number: [id number] Patient Account Number: [id number] Date of Birth/Sex: August 29, 1930 (87 y.o. M) Treating RN: Donnamarie Poag Primary Care Provider: Fulton Reek Other Clinician: Referring Provider: Referral, Self Treating Provider/Extender: Skipper Cliche in Treatment: 0 Verbal / Phone Orders: No Diagnosis Coding ICD-10 Coding Meza Description I89.0 Lymphedema, not elsewhere classified I87.2 Venous insufficiency (chronic) (peripheral) I10  Essential (primary) hypertension I25.10 Atherosclerotic heart disease of native coronary artery without angina pectoris L84 Corns and callosities Follow-up Appointments o Return Appointment in 1 week. o Nurse Visit as needed Edema Control - Lymphedema / Segmental Compressive Device / Other o Optional: One layer of unna paste to top of compression wrap (to act as an anchor). o 3 Layer Compression System for Lymphedema. - BILATERAL 3 LAYER COMPRESSION WRAP o Patient to wear own Velcro compression garment. Remove compression stockings every night before going to bed and put on every morning when getting up. - REQUESTS THAT FARROW BE REORDERED AS PRIOR WORN OUT o Elevate, Exercise Daily and Avoid Standing for Long Periods of Time. o Elevate legs to the level of the heart and pump ankles as often as possible o DO YOUR BEST to sleep in the bed at night. DO NOT sleep in your recliner. Long hours of sitting in a recliner leads to swelling of the legs and/or potential wounds on your backside.   o Other: - START antibiotics as ordered for cellulitis lower legs Laboratory o Bacteria identified in Wound by Culture (MICRO) oooo LOINC Meza: [id number] Convenience Name: Wound culture routine Patient Medications Allergies: No Known Allergies Notifications Medication Indication Start End Bactrim DS 09/15/2020 DOSE 1 - oral 800 mg-160 mg tablet - 1 tablet oral taken 2 times per day for 14 days Electronic Signature(s) Signed: 09/15/2020 4:47:44 PM By: Worthy Keeler PA-C Entered By: Worthy Keeler on 09/15/2020 16:47:44 Steven Meza ([id number]) -------------------------------------------------------------------------------- Problem List Details Patient Name: Steven Meza Date of Service: 09/15/2020 2:30 PM Medical Record Number: [id number] Patient Account Number: [id number] Date of Birth/Sex: 1930/11/24 (87 y.o. M) Treating RN: Donnamarie Poag Primary Care Provider: Fulton Reek Other Clinician: Referring Provider: Referral, Self Treating Provider/Extender: Skipper Cliche in Treatment: 0 Active Problems ICD-10 Encounter Meza Description Active Date MDM Diagnosis I89.0 Lymphedema, not elsewhere classified 09/15/2020 No Yes I87.2 Venous insufficiency (chronic) (peripheral) 09/15/2020 No Yes I10 Essential (primary) hypertension 09/15/2020 No Yes I25.10 Atherosclerotic heart disease of native coronary artery without angina 09/15/2020 No Yes pectoris L84 Corns and callosities 09/15/2020 No Yes Inactive Problems Resolved Problems Electronic Signature(s) Signed: 09/15/2020 3:33:40 PM By: Worthy Keeler PA-C Entered By: Worthy Keeler on 09/15/2020 15:33:40 Steven Meza ([id number]) -------------------------------------------------------------------------------- Progress Note Details Patient Name: Steven Meza Date of Service: 09/15/2020 2:30 PM Medical Record Number: [id number] Patient Account Number: [id number] Date of Birth/Sex: [birth year] (87 y.o. M) Treating RN: Donnamarie Poag Primary Care Provider: Fulton Reek Other Clinician: Referring Provider: Referral, Self Treating Provider/Extender: Skipper Cliche in Treatment: 0 Subjective Chief Complaint Information obtained from Patient Bilateral LE Lymphedema and Cellulitis History of Present Illness (HPI) ADMISSION 05/17/2018 Mr. Gottsch is an 87 year old man who is been treated at the lymphedema clinic for a long period with lymphedema wraps for bilateral lower extremity lymphedema. About 6 months ago they noted small open areas on his left lateral calf just above the ankle. These were open. They are apparently putting some form of ointment on this. They have been referred here for our review of this. The patient has a long history of lymphedema and venous stasis. It says in his records that he has recurrent cellulitis although his wife denies this but she states that the lymphedema may have  started with cellulitis several years ago. Also in his record there is a history of bladder cancer which they seem to know very little about however he did not have any radiation to the pelvis or anything that could have contributed to lymphedema that they are aware of. There is no prior wound history. The patient has 2 small punched out areas on the left lateral calf that have adherent debris on the surface. Past medical history; lymphedema, hypertension, cellulitis, hearing loss, morbid obesity, osteoarthritis, venous stasis, bladder CA, diverticulosis. ABIs in our clinic were 0.36 on the right and 0.57 on the left 05/24/2018; corrected age on this patient is 70 versus what I said last week. He has been for his arterial studies which predictably are not very good. On the right his ABI is 0.65. Monophasic waveforms noted at the right ankle including the posterior tibial and dorsalis pedis. He has triphasic waveforms of the common femoral and profunda femoris triphasic proximal SFA with monophasic distal FSA and popliteal artery. Monophasic tibial waveforms. On the left his ABI is 0.58 monophasic waveforms at the left ankle. Duplex of the left lower extremity demonstrated atherosclerotic change with monophasic waveforms throughout the left  lower extremity. There was some concern about left iliac disease and potentially femoral-popliteal and tibial disease. The patient does not describe claudication although according to his wife he over emphasizes his activity. Patient states he is limited by pain in both knees His wounds are on the left lateral calf. 2 small areas with necrotic debris. We have been using silver collagen and light Ace wraps 05/31/2018; wounds on the left lateral calf in the setting of very significant lymphedema and probably PAD. We have been using silver collagen. The base of the small wound looks reasonably improved. I sent him to see Dr. Fletcher Anon however I see now he has an appointment  with Dr. Alvester Chou on February 12 2/5; left lateral calf wound looks the same. His wife is doing a good job with her lymphedema wraps and maintaining that the edema. He most likely has PAD and is due to see Dr. Gwenlyn Found of infectious disease on February 12 2/19; left lateral calf wounds look about the same. This looks like wound secondary to chronic venous insufficiency with lymphedema however the patient has very poor arterial studies. We referred him to Dr. Gwenlyn Found. Dr. Gwenlyn Found did not feel he needed to do anything from an arterial point of view. He did not address the question about the aggressiveness of compression. After some thoughts about this I elected to go ahead and put him in 3 layer compression which after all would be less compression then the lymphedema clinic was putting on this. I am hopeful that this should be enough to get some closure of the small wounds 2/26; left lateral calf wound letter this week. He tolerated the 3 layer compression well furthermore he is sleeping in a hospital bed which helps keep his legs up at night. The wound looks better measuring smaller especially in width 3/3; left lateral calf wound about the same size. The 3 layer compression has really helped get the edema down in his leg however. Using silver collagen 3/11; left lateral calf wound better looking wound surface but about the same size. We have been using 3 layer compression which is really helped get the edema down in the left leg. Even after Dr. Kennon Holter assessment I am reluctant to go to 4 layer compression. He is tolerating 3 layer compression well. We have been using silver collagen 3/18-Patient returns for his left lateral calf wound which overall is looking better, slightly increased in size and dimensions and area, he is tolerating the 3 layer compression, has been dressed with silver collagen which we will continue 3/25 left lateral calf wound much better looking. Size is smaller. He has been using  silver collagen 4/1; gradually getting smaller in size. Using silver collagen with 3 layer compression. I think the patient has some degree of PAD. He saw Dr. Gwenlyn Found in consultation, he did not specifically comment on whether he could tolerate 4 layer compression 4/8; the wound is slightly smaller in depth. We are using 3 layer compression with silver collagen. I had him seen for arterial insufficiency however his ABI was 0.57 in the left leg. Dr. Gwenlyn Found really did not seem to middle on the degree of compression he could tolerate 4/15; the wound is slightly smaller and slightly less deep. We have been using 3 layer compression with silver collagen. There is not an option for 4-layer compression here. 4/22; the patient is making decent progress on his wound on the left lateral calf however he arrives in with a superficial wound on the right anterior  tibial area. Were not really sure how this happened. He had been using a stocking on the right leg. He has been using silver collagen on the wounds Amazing to me he is he appears to have compression pumps at home. I am not really sure I knew this before. In any case he has not been using them 4/29; left lateral calf wound continues to get gradually smaller. This looks like it is on its way to closure. The area on the right anterior tibial area is about the same in terms of size superficial without any depth. This was probably wrap injury He tells me that he has been using his compression pumps once a day for 1 hour without any pain Margot ChimesCARTER, Shloima A. ([id number]) 5/6; not as good today. Left lateral calf wound is actually larger and he does not have quite as good edema control. The area on the right anterior tibia is about the same still superficial. He states he is using his compression pumps once a day. We are using 3 layer compression on the left and Curlex Coban on the right 5/13; the area on the right is very close to being healed. The area on the left  still has some depth but has a healthy wound surface. He has excellent edema control bilaterally. Using Hydrofera Blue 5/20-Patient returns in 1 week after being in 3 layer compression on the left, the left lateral calf ulcer is stable with some slough, the right anterior tibial ulcer is healed we have been using Kerlix Coban on that leg 5/27; Hydrofera Blue and 3 layer compression. Still some slough in the wound area and some nonviable edges around the wound. 6/3; this patient has lymphedema and chronic venous insufficiency. He had difficult to close wound on his left lateral calf. This is closed today. 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 Finally he has known PAD but he is managed to tolerate the compression we reporting on him and also using his compression pumps that he already had at home twice a day 6/10; we discharge this patient last week having finally close the small area in the left lateral calf. Apparently by Friday of last week this had opened and was draining. He is therefore back in clinic. His wife who is present also was concerned about an area on the right anterior tibia."o 6/17; the patient does not have an open area on the right leg. On the left he has a small circular area anteriorly and the same area laterally that he had last time. However both of these appear to be improved he has his Farrow wrap 30/40. It turns out that he is only using his external compression pumps once a day. I tried to get him up to twice a day today 6/24; the patient comes in with his external compression garment on the right leg/Farrow wrap. As far as we know no open wounds here. He has 2 areas that are smaller one on the left anterior pretibial and one on the left lateral calf which is the site of his initial wounds. I have successfully caught him into his external compression pumps twice a day 7/1; still using his external compression garment on the right  leg. As far as we know there are no open wounds here. The area on the left lateral calf which was the site of his initial wound has closed over. The area on the left anterior pretibial that we identified last week is  still open. 7/8; as far as we know he has no open wounds on the right leg. He has his external compression garment on here. The original wound on the left lateral calf has closed over. He developed an area on the left anterior pretibial 2 weeks ago and seems to have 2 smaller areas on the proximal and distal lateral calf. We do not have his good edema control this week as I am used to seeing 7/15; the patient has still a linear area on the left anterior tibia. The area on the left lateral leg looked somewhat inflamed today but no cellulitis. He did have what looked to be a small pustule that I cultured for CandS although I did not give him empiric antibiotics. 7/22; small area on the left anterior tibia. The area on the left lateral leg still looks inflamed. The pustule that I cultured last week grew Enterobacter and Pseudomonas. I prescribed Cipro they still have not picked that up. In any case the area actually looks fairly satisfactory. He has decent edema control using his compression pumps twice a day 7/29; the area on the left lateral leg looks a lot better than last week. Nothing is open here. He has an area just medial to the mid anterior tibia that is still open that is only open wound today. 8/5-Patient returns at 1 week for the left leg ulcer, which is actually looking better, he is also complaining of left plantar foot pain below the great toe. He does have a hematoma here 8/12-Patient returns at 1 week, the left leg open area looks about the same if not slightly better, the left plantar hematoma under the great toe is about the same, patient apparently has not been resting his foot on anything for too long period of time and does not wear the Birkenstock shoes unless he  walks outdoors which he does not do often 8/19; small wound on the left anterior lower leg this is just about closed there is nothing on the left lateral leg. He is using his compression pumps He arrives in clinic today with a black eschar over the right first plantar metatarsal head. This was apparently a bruise that was identified by her nurses last week. He wears crocs on his feet. He also may rub these on a footboard at home 8/26; there is nothing open on the left lateral lower leg he has a small area just medial to the tibia. He is going to see his podiatrist Dr. Milinda Pointer about the area on the left plantar first metatarsal head. He is using his external compression pumps. He has a juxta lite on the right leg 9/2; there is nothing open on either side of her left lower leg. He went to see podiatry about the area on the left first metatarsal head. This was apparently debrided. He was given mupirocin and oral antibiotics. I do not believe he is offloading this at all. Readmission: 04/02/2019 upon evaluation today patient actually appears for reevaluation here in our clinic secondary to issues that he is having with his left lower extremity. He has been tolerating the dressing changes without complication. Fortunately there is no signs of active infection at this time. No fevers, chills, nausea, vomiting, or diarrhea. He has been in the hospital since we last saw him and then subsequently transferred back to [facility]. He tells me that they have been changing his dressing once a day and that he has had a significant amount of pain secondary to  undergoing these dressing changes. Fortunately there is no evidence of active infection at this point which is good news. No fevers, chills, nausea, vomiting, or diarrhea. With that being said he wonders if we can go back to doing what we were doing before which was when he actually had the dressing changes performed once a week here in the clinic he tells me  that he did very well with that. At that point he tolerated a 3 layer compression wrap which was approved by vascular, Dr. Tyrell Antonio office, to be used previous although 4-layer compression wrap is probably too much for him. 04/16/2019 on evaluation today patient appears to be doing decently well with regard to his lower extremity although he has had a lot of drainage from his leg. A lot of this I think is just secondary to the lymphedema with that being said he does have some odor as well which may again just be due to more of the excessive moisture although I think that he may also have something that is a result of bacteria again he has somewhat of a Pseudomonas-like smell to the drainage and fortunately though he is not showing any signs of severe cellulitis he is still experiencing quite a bit of drainage and the discoloration is somewhat of a yellow/green in color which is consistent with Pseudomonas. If he can tolerated I think he may do well with Cipro. 04/24/2019 on evaluation today patient appears to be doing well with regard to his wound in the gluteal region which is healing quite well. That is excellent news. With that being said he is also doing better with regard to his left lower extremity overall I feel like things are definitely showing signs of improvement. 12/28-Patient returns with regards to his gluteal wound that is improving, Left lower leg wounds with significant edema in the lower legs that appear to be persistent. Patient is in compression and has lymphedema pumps at home but apparently he does not use consistently 05/07/2019 Upon inspection patient's wound bed actually showed signs on evaluation today patient's wound currently showed signs of improvement. There is still a lot of maceration but it does look like that there is been some of this maceration occurring as a result of drainage in particular and some as a result of the fact that they have been putting bag balm on the  wound area as well as the leg in general. I think this is applying too much moisture and not doing him any good. I discussed with the patient and his wife today that I think that practice needs to be adjusted I would recommend using a different type of lotion in order to help with moisture around the wound but I would not recommend anything directly on the wound itself. 05/14/2019 on evaluation today patient actually appears to be doing much better in regard to his lower extremity. They have not been using any of ROMULUS, CRAFTS A. ([id number]) the bag balm on the area I think this has made a tremendous improvement overall in his wounds. He has a lot of new skin growth and though there still are a lot of open wounds scattered throughout the circumferential lower leg this overall seems to be doing much better to me. 05/21/2019 upon evaluation today patient appears to be doing well in regard to his lower extremity although he is having unfortunately some blue- green drainage noted at this point. The does have me concerned for a reinitiation of the Pseudomonas infection that we previously  treated in December. The last time I gave him a prescription was April 16, 2019. That was a little over a month ago. Nonetheless he does have more green drainage noted today and I think this is indicative of potentially a revving up infection. Obviously we do not want to get to that point. 05/28/2019 on evaluation today patient appears to be doing better with regard to his lower extremity at this point. Overall I am seeing signs of improvement which is good news. Fortunately there is no evidence of active infection at this time. I do believe that the Cipro has been beneficial for him that was prescribed last week for 14 days he is halfway through that and already appears to be doing much better. This is excellent news. 06/04/2019 upon evaluation today I do believe that the patient is doing better with regard to his lower  extremity. I feel like that he has much less in the way of open wounds and drainage and much in provement in regard to the overall weeping and even his swelling. There is no signs of erythema no obvious evidence of active infection all of which is great news. No fevers, chills, nausea, vomiting, or diarrhea. 06/11/2019 on evaluation today patient appears to be doing better with regard to his lower extremity on the left. He has been tolerating dressing changes without complication. Fortunately there is no signs of active infection at this time. No fevers, chills, nausea, vomiting, or diarrhea. 06/18/2019 upon evaluation today patient appears to be doing slightly worse compared to last week. They did call me on Friday due to the fact that he had green discharge and both his wife as well as his home health nurse were concerned about what they were seeing. Subsequently I did actually send in a prescription for Cipro for 14 days for him and according to his wife this seems to be doing better based on what she is seeing today compared to Friday. This is great news. Home health is still coming out to see him on Wednesdays and Fridays as well. 06/25/2019 upon evaluation today patient appears to be doing somewhat better in regard to his leg ulcer. Fortunately there is no signs of active infection at this time which is good news. With that being said I am very pleased with how things seem to be going and overall I see no major issues here I think the alginate is doing well along with the XtraSorb. With regard to the patient's plantar foot he does have somewhat of a blood blister that appears to be resolving at this point his wife states that she feels like the issue is that he is not dropping his shoe in order to keep it from sliding when he walks. Subsequently this is leading to increases in friction which of course is leading to more issues with getting this area to heal the patient is struggling in that  regard. 07/02/2019 upon evaluation today patient appears to be making some progress in regard to his lower extremities. With that being said I really feel like the alginate may be getting too moist and then sitting on the skin causing some complications here for [place]. I discussed with the patient and his wife today as well the possibility of going ahead and discontinuing the alginate just utilizing the Xtrasorb to try to keep the edema under better control while at the same time preventing any dressings from staying too moist right on the surface of the wounds. Obviously I think this  can do a good job in helping to dry things out but I would recommend that we give this a shot. 07/09/2019 upon evaluation today patient appears to be doing better in regard to some of the edema and weeping in regard to the left lower extremity. Unfortunately he has not been wearing his compression of the right lower extremity and now that is open as well. He needs to obviously be wearing this although for today we will get a half to wrap him due to the fact that this is now open. I explained that he does need to be wearing his compression at all times. 07/16/2019 upon evaluation today patient actually does appear to be making some progress in regard to his lower extremity ulcers at this point. In fact he really does not have any ulcerations as much as he just does have lymphedema breakdown causing weeping. Nonetheless he is drying up quite a bit but overall seems to be doing much better. He has a lot of dry flaky skin I think we may want to add triamcinolone to the skin to try to help out with cutting back on some of the irritation here hopefully improve the overall skin quality. He does have Velcro compression wraps once he heals that is good news as well. 07/23/2019 upon evaluation today patient appears to be doing better with regard to his bilateral lower extremities. I think we are getting closer to getting everything sealed  up. Again he does not have any significant wounds and his lymphedema has dramatically improved. 07/30/2019 upon evaluation today patient appears to be doing excellent in regard to his bilateral lower extremities. Fortunately there is no signs of active infection at this time. Overall the patient seems to be in fact completely sealed up as far as any areas of weeping or concern which also has me thrilled based on what we are seeing currently. Overall I think that he is doing extremely well. 09/03/2019 upon evaluation today patient appears to be doing somewhat poorly in regard to his left lower extremity with regard to open wounds. Fortunately there is nothing that appears to be draining too terribly although he has about 3 different areas that are open as far as wounds are concerned all 3 seem to be fairly superficial. He was discharged from our clinic on 07/30/2019. Subsequently he reopened in the interim apparently at home health never completely discharged home from being seen. They in fact have still continue to wrap his left leg up until now. 09/10/2019 upon evaluation today patient actually appears to be doing better with regard to his lower extremities. Fortunately there is no signs of active infection at this time. No fevers, chills, nausea, vomiting, or diarrhea. Overall been fairly pleased with how things seem to be progressing at this point. The patient is likewise happy that his legs look better. He tells me he has been elevating more. With that being said he does not really commit to exactly how much more and his wife tells me he still spends a lot of time with his feet on the ground. 5/19; have not seen this patient in quite some time. He had wounds on his left lower extremity. These are all healed today. He has juxta lites for both legs. He has 1 on the right leg it looks as though he has been putting this on well. His wife emphasizes that he lotions his own legs and puts on his own stocking.  He also has external compression pumps that he  uses twice a day Readmission: 09/15/2020 upon evaluation today patient appears to be doing somewhat poorly in regard to his bilateral lower extremities. He seems to likely have cellulitis. He is having weeping and drainage due to lymphedema but I do not see any evidence of actual systemic infection at this point which is great news. With that being said I do feel like that this is likely continue to be addressed with some oral antibiotics at minimum. If he gets any worse he is probably need to go to the hospital. The patient's ABI when last checked by Dr. Alvester Chou was on the right 0.65 and the left 0.58 but it was felt that he could tolerate compression and subsequently he is always tolerated the 3 layer compression wrap. Again he does have a history of lymphedema, chronic venous insufficiency, hypertension, coronary artery disease, and callus buildup on his feet. Patient History Information obtained from Patient. YAN, RICHARDSON ([id number]) Allergies No Known Allergies Family History Cancer - Mother, Diabetes - Siblings, Heart Disease - Mother, No family history of Hereditary Spherocytosis, Hypertension, Kidney Disease, Lung Disease, Seizures, Stroke, Thyroid Problems, Tuberculosis. Social History Former smoker, Alcohol Use - Moderate, Drug Use - No History, Caffeine Use - Daily. Medical History Eyes Denies history of Cataracts, Glaucoma, Optic Neuritis Ear/Nose/Mouth/Throat Denies history of Chronic sinus problems/congestion, Middle ear problems Hematologic/Lymphatic Patient has history of Lymphedema Denies history of Anemia, Hemophilia, Human Immunodeficiency Virus, Sickle Cell Disease Respiratory Denies history of Aspiration, Asthma, Chronic Obstructive Pulmonary Disease (COPD), Pneumothorax, Sleep Apnea, Tuberculosis Cardiovascular Patient has history of Coronary Artery Disease, Hypertension Denies history of Angina, Arrhythmia,  Congestive Heart Failure, Deep Vein Thrombosis, Hypotension, Myocardial Infarction, Peripheral Arterial Disease, Peripheral Venous Disease, Phlebitis, Vasculitis Gastrointestinal Denies history of Cirrhosis , Colitis, Crohn s, Hepatitis A, Hepatitis B, Hepatitis C Endocrine Denies history of Type I Diabetes, Type II Diabetes Genitourinary Denies history of End Stage Renal Disease Immunological Denies history of Lupus Erythematosus, Raynaud s, Scleroderma Integumentary (Skin) Denies history of History of Burn, History of pressure wounds Musculoskeletal Denies history of Gout, Rheumatoid Arthritis, Osteoarthritis, Osteomyelitis Neurologic Denies history of Dementia, Neuropathy, Quadriplegia, Paraplegia, Seizure Disorder Oncologic Denies history of Received Chemotherapy, Received Radiation Medical And Surgical History Notes Ear/Nose/Mouth/Throat hoh Review of Systems (ROS) Constitutional Symptoms (General Health) Denies complaints or symptoms of Fatigue, Fever, Chills, Marked Weight Change. Eyes Denies complaints or symptoms of Dry Eyes, Vision Changes, Glasses / Contacts. Ear/Nose/Mouth/Throat Denies complaints or symptoms of Difficult clearing ears, Sinusitis. Hematologic/Lymphatic Denies complaints or symptoms of Bleeding / Clotting Disorders, Human Immunodeficiency Virus. Respiratory Denies complaints or symptoms of Chronic or frequent coughs, Shortness of Breath. Cardiovascular Denies complaints or symptoms of Chest pain, LE edema. Gastrointestinal Denies complaints or symptoms of Frequent diarrhea, Nausea, Vomiting. Endocrine Denies complaints or symptoms of Hepatitis, Thyroid disease, Polydypsia (Excessive Thirst). Genitourinary Denies complaints or symptoms of Kidney failure/ Dialysis, Incontinence/dribbling. Immunological Denies complaints or symptoms of Hives, Itching. Integumentary (Skin) Complains or has symptoms of Wounds. Denies complaints or symptoms of  Bleeding or bruising tendency, Breakdown, Swelling. Musculoskeletal Denies complaints or symptoms of Muscle Pain, Muscle Weakness. Neurologic Denies complaints or symptoms of Numbness/parasthesias, Focal/Weakness. Psychiatric Denies complaints or symptoms of Anxiety, Claustrophobia. ARIHANT, BYRDSONG ([id number]) Objective Constitutional patient is hypertensive.. pulse regular and within target range for patient.Marland Kitchen respirations regular, non-labored and within target range for patient.Marland Kitchen temperature within target range for patient.. Well-nourished and well-hydrated in no acute distress. Vitals Time Taken: 3:01 PM, Height: 67 in, Source: Stated, Weight: 250 lbs, Source: Stated,  BMI: 39.2, Temperature: 98.8 F, Pulse: 91 bpm, Respiratory Rate: 18 breaths/min, Blood Pressure: 149/72 mmHg. Eyes conjunctiva clear no eyelid edema noted. pupils equal round and reactive to light and accommodation. Ears, Nose, Mouth, and Throat no gross abnormality of ear auricles or external auditory canals. normal hearing noted during conversation. mucus membranes moist. Respiratory normal breathing without difficulty. Cardiovascular 1+ dorsalis pedis/posterior tibialis pulses. Musculoskeletal Patient unable to walk without assistance. no significant deformity or arthritic changes, no loss or range of motion, no clubbing. Psychiatric this patient is able to make decisions and demonstrates good insight into disease process. Alert and Oriented x 3. pleasant and cooperative. General Notes: Upon inspection patient has bilateral lower extremity cellulitis and edema. He does have stage III lymphedema unfortunately. With that being said I do believe that he is going require some antibiotics to try to get some things under control here. I think Bactrim is probably the best option for him and I Georgina Peer go ahead and see about sending that into the pharmacy currently. Nonetheless I think that if anything is getting worse or  not improving he is probably getting need to go to the ER for further evaluation and treatment especially if he develops any fevers, chills, nausea, vomiting, or diarrhea all of which was discussed with the patient and his wife today. Assessment Active Problems ICD-10 Lymphedema, not elsewhere classified Venous insufficiency (chronic) (peripheral) Essential (primary) hypertension Atherosclerotic heart disease of native coronary artery without angina pectoris Corns and callosities Procedures There was a Three Layer Compression Therapy Procedure by Donnamarie Poag, RN. Post procedure Diagnosis Wound #: Same as Pre-Procedure There was a Three Layer Compression Therapy Procedure by Donnamarie Poag, RN. Post procedure Diagnosis Wound #: Same as Pre-Procedure AZAREL, MCNIFF ([id number]) Plan Follow-up Appointments: Return Appointment in 1 week. Nurse Visit as needed Edema Control - Lymphedema / Segmental Compressive Device / Other: Optional: One layer of unna paste to top of compression wrap (to act as an anchor). 3 Layer Compression System for Lymphedema. - BILATERAL 3 LAYER COMPRESSION WRAP Patient to wear own Velcro compression garment. Remove compression stockings every night before going to bed and put on every morning when getting up. - REQUESTS THAT FARROW BE REORDERED AS PRIOR WORN OUT Elevate, Exercise Daily and Avoid Standing for Long Periods of Time. Elevate legs to the level of the heart and pump ankles as often as possible DO YOUR BEST to sleep in the bed at night. DO NOT sleep in your recliner. Long hours of sitting in a recliner leads to swelling of the legs and/or potential wounds on your backside. Other: - START antibiotics as ordered for cellulitis lower legs Laboratory ordered were: Wound culture routine The following medication(s) was prescribed: Bactrim DS oral 800 mg-160 mg tablet 1 1 tablet oral taken 2 times per day for 14 days starting 09/15/2020 1. Would recommend  currently that we going continue with the wound care measures as before and the patient is in agreement with the plan. This includes the use of the 3 layer compression wrap which is always done well for him in the past. 2. I am also can recommend that we have the patient have ABD pads over any draining or weeping areas to catch the drainage here. Obviously I think that is important just in case anything does seem to be draining too much. 3. I am also can recommend at this point that we have the patient proceed with a wound culture I Georgina Peer go ahead and send that  into be evaluated today for him. Obviously I think that the culture is necessary in order to ensure that we have him on the proper antibiotics. 4. I am also going to recommend that we go ahead and get him started on antibiotic therapy. Specifically I think that Bactrim DS would probably be a good option at this time. We will see patient back for reevaluation in 1 week here in the clinic. If anything worsens or changes patient will contact our office for additional recommendations. Electronic Signature(s) Signed: 09/15/2020 4:47:50 PM By: Worthy Keeler PA-C Entered By: Worthy Keeler on 09/15/2020 16:47:50 [place] Epifania Gore ([id number]) -------------------------------------------------------------------------------- ROS/PFSH Details Patient Name: Steven Meza Date of Service: 09/15/2020 2:30 PM Medical Record Number: [id number] Patient Account Number: [id number] Date of Birth/Sex: 11/13/1930 (87 y.o. M) Treating RN: Carlene Coria Primary Care Provider: Fulton Reek Other Clinician: Referring Provider: Referral, Self Treating Provider/Extender: Skipper Cliche in Treatment: 0 Information Obtained From Patient Constitutional Symptoms (General Health) Complaints and Symptoms: Negative for: Fatigue; Fever; Chills; Marked Weight Change Eyes Complaints and Symptoms: Negative for: Dry Eyes; Vision Changes; Glasses /  Contacts Medical History: Negative for: Cataracts; Glaucoma; Optic Neuritis Ear/Nose/Mouth/Throat Complaints and Symptoms: Negative for: Difficult clearing ears; Sinusitis Medical History: Negative for: Chronic sinus problems/congestion; Middle ear problems Past Medical History Notes: hoh Hematologic/Lymphatic Complaints and Symptoms: Negative for: Bleeding / Clotting Disorders; Human Immunodeficiency Virus Medical History: Positive for: Lymphedema Negative for: Anemia; Hemophilia; Human Immunodeficiency Virus; Sickle Cell Disease Respiratory Complaints and Symptoms: Negative for: Chronic or frequent coughs; Shortness of Breath Medical History: Negative for: Aspiration; Asthma; Chronic Obstructive Pulmonary Disease (COPD); Pneumothorax; Sleep Apnea; Tuberculosis Cardiovascular Complaints and Symptoms: Negative for: Chest pain; LE edema Medical History: Positive for: Coronary Artery Disease; Hypertension Negative for: Angina; Arrhythmia; Congestive Heart Failure; Deep Vein Thrombosis; Hypotension; Myocardial Infarction; Peripheral Arterial Disease; Peripheral Venous Disease; Phlebitis; Vasculitis Gastrointestinal Complaints and Symptoms: Negative for: Frequent diarrhea; Nausea; Vomiting Medical History: Negative for: Cirrhosis ; Colitis; Crohnos; Hepatitis A; Hepatitis B; Hepatitis C SEMAJE, LOCHER. ([id number]) Endocrine Complaints and Symptoms: Negative for: Hepatitis; Thyroid disease; Polydypsia (Excessive Thirst) Medical History: Negative for: Type I Diabetes; Type II Diabetes Genitourinary Complaints and Symptoms: Negative for: Kidney failure/ Dialysis; Incontinence/dribbling Medical History: Negative for: End Stage Renal Disease Immunological Complaints and Symptoms: Negative for: Hives; Itching Medical History: Negative for: Lupus Erythematosus; Raynaudos; Scleroderma Integumentary (Skin) Complaints and Symptoms: Positive for: Wounds Negative for: Bleeding  or bruising tendency; Breakdown; Swelling Medical History: Negative for: History of Burn; History of pressure wounds Musculoskeletal Complaints and Symptoms: Negative for: Muscle Pain; Muscle Weakness Medical History: Negative for: Gout; Rheumatoid Arthritis; Osteoarthritis; Osteomyelitis Neurologic Complaints and Symptoms: Negative for: Numbness/parasthesias; Focal/Weakness Medical History: Negative for: Dementia; Neuropathy; Quadriplegia; Paraplegia; Seizure Disorder Psychiatric Complaints and Symptoms: Negative for: Anxiety; Claustrophobia Oncologic Medical History: Negative for: Received Chemotherapy; Received Radiation Immunizations Pneumococcal Vaccine: Received Pneumococcal Vaccination: Yes Tetanus Vaccine: Last tetanus shot: 01/02/2018 Implantable Devices None Family and Social History VONTRELL, STENDAHL ([id number]) Cancer: Yes - Mother; Diabetes: Yes - Siblings; Heart Disease: Yes - Mother; Hereditary Spherocytosis: No; Hypertension: No; Kidney Disease: No; Lung Disease: No; Seizures: No; Stroke: No; Thyroid Problems: No; Tuberculosis: No; Former smoker; Alcohol Use: Moderate; Drug Use: No History; Caffeine Use: Daily; Financial Concerns: No; Food, Clothing or Shelter Needs: No; Support System Lacking: No; Transportation Concerns: No Electronic Signature(s) Signed: 09/15/2020 5:15:41 PM By: Worthy Keeler PA-C Signed: 09/15/2020 8:20:21 PM By: Carlene Coria RN Entered By: Carlene Coria on 09/15/2020 15:05:36 [place],  Epifania Gore ([id number]) -------------------------------------------------------------------------------- SuperBill Details Patient Name: Steven Meza Date of Service: 09/15/2020 Medical Record Number: [id number] Patient Account Number: [id number] Date of Birth/Sex: Jun 08, 1930 (87 y.o. M) Treating RN: Donnamarie Poag Primary Care Provider: Fulton Reek Other Clinician: Referring Provider: Referral, Self Treating Provider/Extender: Skipper Cliche in  Treatment: 0 Diagnosis Coding ICD-10 Codes Meza Description I89.0 Lymphedema, not elsewhere classified I87.2 Venous insufficiency (chronic) (peripheral) I10 Essential (primary) hypertension I25.10 Atherosclerotic heart disease of native coronary artery without angina pectoris L84 Corns and callosities Facility Procedures CPT4: Description Modifier Quantity Meza [id number] - WOUND CARE VISIT-LEV 2 EST PT 1 CPT4: [id number] BILATERAL: Application of multi-layer venous compression system; leg (below knee), including 1 ankle and foot. Physician Procedures CPT4 Meza: [id number] Description: [id number] - WC PHYS LEVEL 4 - EST PT Modifier: Quantity: 1 CPT4 Meza: Description: ICD-10 Diagnosis Description I89.0 Lymphedema, not elsewhere classified I87.2 Venous insufficiency (chronic) (peripheral) I10 Essential (primary) hypertension I25.10 Atherosclerotic heart disease of native coronary artery without angina Modifier: pectoris Quantity: Electronic Signature(s) Signed: 09/15/2020 4:48:01 PM By: Worthy Keeler PA-C Entered By: Worthy Keeler on 09/15/2020 16:48:01

## 2020-09-16 NOTE — Progress Notes (Signed)
Steven Meza ([id number]) Visit Report for 09/15/2020 Allergy List Details Patient Name: Steven Meza, Steven Meza Date of Service: 09/15/2020 2:30 PM Medical Record Number: [id number] Patient Account Number: [id number] Date of Birth/Sex: [birth year] (87 y.o. M) Treating RN: Steven Meza Primary Care Steven Meza: Steven Meza Other Clinician: Referring Steven Meza: Referral, Self Treating Steven Meza/Extender: Steven Meza in Treatment: 0 Allergies Active Allergies No Known Allergies Allergy Notes Electronic Signature(s) Signed: 09/15/2020 8:20:21 PM By: Steven Coria RN Entered By: Steven Meza on 09/15/2020 15:02:44 Steven Meza ([id number]) -------------------------------------------------------------------------------- Arrival Information Details Patient Name: Steven Meza Date of Service: 09/15/2020 2:30 PM Medical Record Number: [id number] Patient Account Number: [id number] Date of Birth/Sex: Aug 30, 1930 (87 y.o. M) Treating RN: Steven Meza Primary Care Emnet Monk: Steven Meza Other Clinician: Referring Nimrat Woolworth: Referral, Self Treating Steven Meza/Extender: Steven Meza in Treatment: 0 Visit Information Patient Arrived: Walker Arrival Time: 14:59 Accompanied By: wife Transfer Assistance: None Patient Identification Verified: Yes Secondary Verification Process Completed: Yes Patient Requires Transmission-Based Precautions: No Patient Has Alerts: Yes Patient Alerts: ABI right .65 ABI left .58 History Since Last Visit All ordered tests and consults were completed: No Added or deleted any medications: No Any new allergies or adverse reactions: No Had a fall or experienced change in activities of daily living that may affect risk of falls: No Signs or symptoms of abuse/neglect since last visito No Hospitalized since last visit: No Implantable device outside of the clinic excluding cellular tissue based products placed in the center since last visit: No Has Dressing in  Place as Prescribed: Yes Electronic Signature(s) Signed: 09/15/2020 8:20:21 PM By: Steven Coria RN Entered By: Steven Meza on 09/15/2020 15:21:15 Steven Meza ([id number]) -------------------------------------------------------------------------------- Clinic Level of Care Assessment Details Patient Name: Steven Meza Date of Service: 09/15/2020 2:30 PM Medical Record Number: [id number] Patient Account Number: [id number] Date of Birth/Sex: 09-23-1930 (87 y.o. M) Treating RN: Steven Meza Primary Care Mollie Rossano: Steven Meza Other Clinician: Referring Steven Meza: Referral, Self Treating Steven Meza/Extender: Steven Meza in Treatment: 0 Clinic Level of Care Assessment Items TOOL 1 Quantity Score X - Use when EandM and Procedure is performed on INITIAL visit 1 0 ASSESSMENTS - Nursing Assessment / Reassessment X - General Physical Exam (combine w/ comprehensive assessment (listed just below) when performed on new 1 20 pt. evals) X- 1 25 Comprehensive Assessment (HX, ROS, Risk Assessments, Wounds Hx, etc.) ASSESSMENTS - Wound and Skin Assessment / Reassessment []  - Dermatologic / Skin Assessment (not related to wound area) 0 ASSESSMENTS - Ostomy and/or Continence Assessment and Care []  - Incontinence Assessment and Management 0 []  - 0 Ostomy Care Assessment and Management (repouching, etc.) PROCESS - Coordination of Care X - Simple Patient / Family Education for ongoing care 1 15 []  - 0 Complex (extensive) Patient / Family Education for ongoing care X- 1 10 Staff obtains Programmer, systems, Records, Test Results / Process Orders []  - 0 Staff telephones HHA, Nursing Homes / Clarify orders / etc []  - 0 Routine Transfer to another Facility (non-emergent condition) []  - 0 Routine Hospital Admission (non-emergent condition) []  - 0 New Admissions / Biomedical engineer / Ordering NPWT, Apligraf, etc. []  - 0 Emergency Hospital Admission (emergent condition) PROCESS - Special  Needs []  - Pediatric / Minor Patient Management 0 []  - 0 Isolation Patient Management []  - 0 Hearing / Language / Visual special needs []  - 0 Assessment of Community assistance (transportation, D/C planning, etc.) []  - 0 Additional assistance / Altered mentation []  - 0 Support Surface(s) Assessment (bed, cushion, seat,  etc.) INTERVENTIONS - Miscellaneous []  - External ear exam 0 []  - 0 Patient Transfer (multiple staff / Civil Service fast streamer / Similar devices) []  - 0 Simple Staple / Suture removal (25 or less) []  - 0 Complex Staple / Suture removal (26 or more) []  - 0 Hypo/Hyperglycemic Management (do not check if billed separately) []  - 0 Ankle / Brachial Index (ABI) - do not check if billed separately Has the patient been seen at the hospital within the last three years: Yes Total Score: 70 Level Of Care: New/Established - Level 2 Steven Meza ([id number]) Electronic Signature(s) Signed: 09/16/2020 2:35:55 PM By: Steven Meza Entered By: Steven Meza on 09/15/2020 15:40:17 Steven Meza ([id number]) -------------------------------------------------------------------------------- Compression Therapy Details Patient Name: Steven Meza Date of Service: 09/15/2020 2:30 PM Medical Record Number: [id number] Patient Account Number: [id number] Date of Birth/Sex: June 23, 1930 (87 y.o. M) Treating RN: Steven Meza Primary Care Javier Gell: Steven Meza Other Clinician: Referring Adlean Hardeman: Referral, Self Treating Juanda Luba/Extender: Steven Meza in Treatment: 0 Compression Therapy Performed for Wound Assessment: NonWound Condition Lymphedema - Right Leg Performed By: Clinician Steven Poag, RN Compression Type: Three Layer Post Procedure Diagnosis Same as Pre-procedure Electronic Signature(s) Signed: 09/16/2020 2:35:55 PM By: Steven Meza Entered By: Steven Meza on 09/15/2020 15:45:10 Steven Meza  ([id number]) -------------------------------------------------------------------------------- Compression Therapy Details Patient Name: Steven Meza Date of Service: 09/15/2020 2:30 PM Medical Record Number: [id number] Patient Account Number: [id number] Date of Birth/Sex: 12-Feb-1931 (87 y.o. M) Treating RN: Steven Meza Primary Care Chadwin Fury: Steven Meza Other Clinician: Referring Jasmaine Rochel: Referral, Self Treating Khailee Mick/Extender: Steven Meza in Treatment: 0 Compression Therapy Performed for Wound Assessment: NonWound Condition Lymphedema - Left Leg Performed By: Clinician Steven Poag, RN Compression Type: Three Layer Post Procedure Diagnosis Same as Pre-procedure Electronic Signature(s) Signed: 09/16/2020 2:35:55 PM By: Steven Meza Entered By: Steven Meza on 09/15/2020 15:45:27 Steven Meza ([id number]) -------------------------------------------------------------------------------- Encounter Discharge Information Details Patient Name: Steven Meza Date of Service: 09/15/2020 2:30 PM Medical Record Number: [id number] Patient Account Number: [id number] Date of Birth/Sex: Jan 18, 1931 (87 y.o. M) Treating RN: Steven Meza Primary Care Rawad Bochicchio: Steven Meza Other Clinician: Referring Enisa Runyan: Referral, Self Treating Teague Goynes/Extender: Steven Meza in Treatment: 0 Encounter Discharge Information Items Discharge Condition: Stable Ambulatory Status: Walker Discharge Destination: Home Transportation: Private Auto Accompanied By: daughter Schedule Follow-up Appointment: Yes Clinical Summary of Care: Electronic Signature(s) Signed: 09/15/2020 5:31:07 PM By: Jeanine Luz Entered By: Jeanine Luz on 09/15/2020 16:21:33 Steven Meza ([id number]) -------------------------------------------------------------------------------- Lower Extremity Assessment Details Patient Name: Steven Meza Date of Service: 09/15/2020 2:30 PM Medical Record Number:  [id number] Patient Account Number: [id number] Date of Birth/Sex: November 10, 1930 (87 y.o. M) Treating RN: Steven Meza Primary Care Arelene Moroni: Steven Meza Other Clinician: Referring Lamekia Nolden: Referral, Self Treating Sana Tessmer/Extender: Steven Meza in Treatment: 0 Edema Assessment Assessed: [Left: No] [Right: No] [Left: Edema] [Right: :] Calf Left: Right: Point of Measurement: 35 cm From Medial Instep 46 cm 43 cm Ankle Left: Right: Point of Measurement: 35 cm From Medial Instep 28 cm 28 cm Knee To Floor Left: Right: From Medial Instep 41 cm 41 cm Vascular Assessment Pulses: Dorsalis Pedis Palpable: [Left:Yes] [Right:Yes] Electronic Signature(s) Signed: 09/15/2020 8:20:21 PM By: Steven Coria RN Entered By: Steven Meza on 09/15/2020 15:20:28 Steven Meza ([id number]) -------------------------------------------------------------------------------- Multi Wound Chart Details Patient Name: Steven Meza Date of Service: 09/15/2020 2:30 PM Medical Record Number: [id number] Patient Account Number: [id number] Date of Birth/Sex: [birth year] (87 y.o. M) Treating RN: Steven Meza Primary Care Leesha Veno: Steven Meza  Other Clinician: Referring Ramya Vanbergen: Referral, Self Treating Uva Runkel/Extender: Steven Meza in Treatment: 0 Vital Signs Height(in): 67 Pulse(bpm): 91 Weight(lbs): 250 Blood Pressure(mmHg): 149/72 Body Mass Index(BMI): 39 Temperature(F): 98.8 Respiratory Rate(breaths/min): 18 Wound Assessments Treatment Notes Electronic Signature(s) Signed: 09/16/2020 2:35:55 PM By: Steven Meza Entered By: Steven Meza on 09/15/2020 15:39:01 Steven Meza ([id number]) -------------------------------------------------------------------------------- Multi-Disciplinary Care Plan Details Patient Name: Steven Meza Date of Service: 09/15/2020 2:30 PM Medical Record Number: [id number] Patient Account Number: [id number] Date of Birth/Sex: 1931-01-05 (87 y.o. M) Treating  RN: Steven Meza Primary Care Taryn Nave: Steven Meza Other Clinician: Referring Hassani Sliney: Referral, Self Treating Tonesha Tsou/Extender: Steven Meza in Treatment: 0 Active Inactive Wound/Skin Impairment Nursing Diagnoses: Impaired tissue integrity Goals: Ulcer/skin breakdown will have a volume reduction of 30% by week 4 Date Initiated: 09/15/2020 Target Resolution Date: 10/13/2020 Goal Status: Active Ulcer/skin breakdown will have a volume reduction of 50% by week 8 Date Initiated: 09/15/2020 Target Resolution Date: 11/10/2020 Goal Status: Active Interventions: Assess patient/caregiver ability to obtain necessary supplies Assess patient/caregiver ability to perform ulcer/skin care regimen upon admission and as needed Notes: Electronic Signature(s) Signed: 09/16/2020 2:35:55 PM By: Steven Meza Entered By: Steven Meza on 09/15/2020 15:38:28 Steven Meza ([id number]) -------------------------------------------------------------------------------- Pain Assessment Details Patient Name: Steven Meza Date of Service: 09/15/2020 2:30 PM Medical Record Number: [id number] Patient Account Number: [id number] Date of Birth/Sex: Nov 13, 1930 (87 y.o. M) Treating RN: Steven Meza Primary Care Jael Kostick: Steven Meza Other Clinician: Referring Mynor Witkop: Referral, Self Treating Keren Alverio/Extender: Steven Meza in Treatment: 0 Active Problems Location of Pain Severity and Description of Pain Patient Has Paino No Site Locations Pain Management and Medication Current Pain Management: Electronic Signature(s) Signed: 09/15/2020 8:20:21 PM By: Steven Coria RN Entered By: Steven Meza on 09/15/2020 15:00:56 Steven Meza ([id number]) -------------------------------------------------------------------------------- Patient/Caregiver Education Details Patient Name: Steven Meza Date of Service: 09/15/2020 2:30 PM Medical Record Number: [id number] Patient Account Number:  [id number] Date of Birth/Gender: 12-03-1930 (87 y.o. M) Treating RN: Steven Meza Primary Care Physician: Steven Meza Other Clinician: Referring Physician: Referral, Self Treating Physician/Extender: Steven Meza in Treatment: 0 Education Assessment Education Provided To: Patient Education Topics Provided Basic Hygiene: Methods: Explain/Verbal Responses: State content correctly Infection: Methods: Explain/Verbal Responses: State content correctly Venous: Methods: Explain/Verbal Responses: State content correctly Welcome To [facility] [place]: Methods: Explain/Verbal Responses: State content correctly Electronic Signature(s) Signed: 09/16/2020 2:35:55 PM By: Steven Meza Entered By: Steven Meza on 09/15/2020 15:39:36 Steven Meza ([id number]) -------------------------------------------------------------------------------- Vitals Details Patient Name: Steven Meza Date of Service: 09/15/2020 2:30 PM Medical Record Number: [id number] Patient Account Number: [id number] Date of Birth/Sex: 1930/06/03 (87 y.o. M) Treating RN: Steven Meza Primary Care Evrett Hakim: Steven Meza Other Clinician: Referring Neveyah Garzon: Referral, Self Treating Jylian Pappalardo/Extender: Steven Meza in Treatment: 0 Vital Signs Time Taken: 15:01 Temperature (F): 98.8 Height (in): 67 Pulse (bpm): 91 Source: Stated Respiratory Rate (breaths/min): 18 Weight (lbs): 250 Blood Pressure (mmHg): 149/72 Source: Stated Reference Range: 80 - 120 mg / dl Body Mass Index (BMI): 39.2 Electronic Signature(s) Signed: 09/15/2020 8:20:21 PM By: Steven Coria RN Entered By: Steven Meza on 09/15/2020 15:02:07

## 2020-09-16 NOTE — Progress Notes (Signed)
BAYLIN, GAMBLIN ([id number]) Visit Report for 09/15/2020 Abuse/Suicide Risk Screen Details Patient Name: Steven Meza, Steven Meza Date of Service: 09/15/2020 2:30 PM Medical Record Number: [id number] Patient Account Number: [id number] Date of Birth/Sex: [birth year] (87 y.o. M) Treating RN: Carlene Coria Primary Care Delno Blaisdell: Fulton Reek Other Clinician: Referring Nawaf Strange: Referral, Self Treating Renly Guedes/Extender: Skipper Cliche in Treatment: 0 Abuse/Suicide Risk Screen Items Answer ABUSE RISK SCREEN: Has anyone close to you tried to hurt or harm you recentlyo No Do you feel uncomfortable with anyone in your familyo No Has anyone forced you do things that you didnot want to doo No Electronic Signature(s) Signed: 09/15/2020 8:20:21 PM By: Carlene Coria RN Entered By: Carlene Coria on 09/15/2020 15:05:55 Steven Meza ([id number]) -------------------------------------------------------------------------------- Activities of Daily Living Details Patient Name: Steven Meza Date of Service: 09/15/2020 2:30 PM Medical Record Number: [id number] Patient Account Number: [id number] Date of Birth/Sex: Jun 06, 1930 (87 y.o. M) Treating RN: Carlene Coria Primary Care Arohi Salvatierra: Fulton Reek Other Clinician: Referring Christohper Dube: Referral, Self Treating Marnell Mcdaniel/Extender: Skipper Cliche in Treatment: 0 Activities of Daily Living Items Answer Activities of Daily Living (Please select one for each item) Drive Automobile Not Able Take Medications Need Assistance Use Telephone Need Assistance Care for Appearance Need Assistance Use Toilet Need Assistance Bath / Shower Need Assistance Dress Self Need Assistance Feed Self Completely Able Walk Need Assistance Get In / Out Bed Need Assistance Housework Not Able Prepare Meals Not Able Handle Money Not Able Shop for Self Not Able Electronic Signature(s) Signed: 09/15/2020 8:20:21 PM By: Carlene Coria RN Entered By: Carlene Coria on 09/15/2020  15:07:08 Steven Meza ([id number]) -------------------------------------------------------------------------------- Education Screening Details Patient Name: Steven Meza Date of Service: 09/15/2020 2:30 PM Medical Record Number: [id number] Patient Account Number: [id number] Date of Birth/Sex: 03/19/1931 (87 y.o. M) Treating RN: Carlene Coria Primary Care Arlan Birks: Fulton Reek Other Clinician: Referring Tijuan Dantes: Referral, Self Treating Donnalyn Juran/Extender: Skipper Cliche in Treatment: 0 Primary Learner Assessed: Patient Learning Preferences/Education Level/Primary Language Learning Preference: Explanation Highest Education Level: College or Above Preferred Language: English Cognitive Barrier Language Barrier: No Translator Needed: No Memory Deficit: No Emotional Barrier: No Cultural/Religious Beliefs Affecting Medical Care: No Physical Barrier Impaired Vision: Yes Impaired Hearing: No Decreased Hand dexterity: No Knowledge/Comprehension Knowledge Level: Medium Comprehension Level: High Ability to understand written instructions: High Ability to understand verbal instructions: High Motivation Anxiety Level: Anxious Cooperation: Cooperative Education Importance: Acknowledges Need Interest in Health Problems: Asks Questions Perception: Coherent Willingness to Engage in Self-Management High Activities: Readiness to Engage in Self-Management High Activities: Electronic Signature(s) Signed: 09/15/2020 8:20:21 PM By: Carlene Coria RN Entered By: Carlene Coria on 09/15/2020 15:07:43 Steven Meza ([id number]) -------------------------------------------------------------------------------- Fall Risk Assessment Details Patient Name: Steven Meza Date of Service: 09/15/2020 2:30 PM Medical Record Number: [id number] Patient Account Number: [id number] Date of Birth/Sex: 1931/01/03 (87 y.o. M) Treating RN: Carlene Coria Primary Care Corie Vavra: Fulton Reek Other  Clinician: Referring Kaleia Longhi: Referral, Self Treating Palestine Mosco/Extender: Skipper Cliche in Treatment: 0 Fall Risk Assessment Items Have you had 2 or more falls in the last 12 monthso 0 No Have you had any fall that resulted in injury in the last 12 monthso 0 No FALLS RISK SCREEN History of falling - immediate or within 3 months 0 No Secondary diagnosis (Do you have 2 or more medical diagnoseso) 0 No Ambulatory aid None/bed rest/wheelchair/nurse 0 No Crutches/cane/walker 0 No Furniture 0 No Intravenous therapy Access/Saline/Heparin Lock 0 No Gait/Transferring Normal/ bed rest/ wheelchair 0 No Weak (short steps with  or without shuffle, stooped but able to lift head while walking, may 0 No seek support from furniture) Impaired (short steps with shuffle, may have difficulty arising from chair, head down, impaired 0 No balance) Mental Status Oriented to own ability 0 No Electronic Signature(s) Signed: 09/15/2020 8:20:21 PM By: Carlene Coria RN Entered By: Carlene Coria on 09/15/2020 15:10:26 Steven Meza ([id number]) -------------------------------------------------------------------------------- Foot Assessment Details Patient Name: Steven Meza Date of Service: 09/15/2020 2:30 PM Medical Record Number: [id number] Patient Account Number: [id number] Date of Birth/Sex: [birth year] (87 y.o. M) Treating RN: Carlene Coria Primary Care Saleena Tamas: Fulton Reek Other Clinician: Referring Abdulkarim Eberlin: Referral, Self Treating Zechariah Bissonnette/Extender: Skipper Cliche in Treatment: 0 Foot Assessment Items Site Locations + = Sensation present, - = Sensation absent, C = Callus, U = Ulcer R = Redness, W = Warmth, M = Maceration, PU = Pre-ulcerative lesion F = Fissure, S = Swelling, D = Dryness Assessment Right: Left: Other Deformity: No No Prior Foot Ulcer: No No Prior Amputation: No No Charcot Joint: No No Ambulatory Status: Ambulatory With Help Assistance Device: Walker Gait:  Administrator, arts) Signed: 09/15/2020 8:20:21 PM By: Carlene Coria RN Entered By: Carlene Coria on 09/15/2020 15:13:54 Steven Meza ([id number]) -------------------------------------------------------------------------------- Nutrition Risk Screening Details Patient Name: Steven Meza Date of Service: 09/15/2020 2:30 PM Medical Record Number: [id number] Patient Account Number: [id number] Date of Birth/Sex: 04/16/1931 (87 y.o. M) Treating RN: Carlene Coria Primary Care Sentoria Brent: Fulton Reek Other Clinician: Referring Emmakate Hypes: Referral, Self Treating Lonnetta Kniskern/Extender: Skipper Cliche in Treatment: 0 Height (in): 67 Weight (lbs): 250 Body Mass Index (BMI): 39.2 Nutrition Risk Screening Items Score Screening NUTRITION RISK SCREEN: I have an illness or condition that made me change the kind and/or amount of food I eat 0 No I eat fewer than two meals per day 0 No I eat few fruits and vegetables, or milk products 0 No I have three or more drinks of beer, liquor or wine almost every day 0 No I have tooth or mouth problems that make it hard for me to eat 0 No I don't always have enough money to buy the food I need 0 No I eat alone most of the time 0 No I take three or more different prescribed or over-the-counter drugs a day 1 Yes Without wanting to, I have lost or gained 10 pounds in the last six months 0 No I am not always physically able to shop, cook and/or feed myself 2 Yes Nutrition Protocols Good Risk Protocol Moderate Risk Protocol High Risk Proctocol Risk Level: Moderate Risk Score: 3 Electronic Signature(s) Signed: 09/15/2020 8:20:21 PM By: Carlene Coria RN Entered By: Carlene Coria on 09/15/2020 15:10:49

## 2020-09-18 ENCOUNTER — Other Ambulatory Visit: Payer: Self-pay

## 2020-09-18 DIAGNOSIS — L97219 Non-pressure chronic ulcer of right calf with unspecified severity: Secondary | ICD-10-CM | POA: Diagnosis not present

## 2020-09-18 NOTE — Progress Notes (Signed)
KHIAN, REMO ([id number]) Visit Report for 09/18/2020 Arrival Information Details Patient Name: Steven Meza, Steven Meza Date of Service: 09/18/2020 4:00 PM Medical Record Number: [id number] Patient Account Number: [id number] Date of Birth/Sex: [birth year] (87 y.o. M) Treating RN: Donnamarie Poag Primary Care Nahmir Zeidman: Fulton Reek Other Clinician: Referring Rachell Druckenmiller: Fulton Reek Treating Sayuri Rhames/Extender: Skipper Cliche in Treatment: 0 Visit Information History Since Last Visit Has Dressing in Place as Prescribed: Yes Patient Arrived: Walker Has Compression in Place as Prescribed: Yes Arrival Time: 16:21 Pain Present Now: No Accompanied By: wife Transfer Assistance: None Patient Identification Verified: Yes Secondary Verification Process Completed: Yes Patient Requires Transmission-Based Precautions: No Patient Has Alerts: Yes Patient Alerts: ABI right .65 ABI left .58 Electronic Signature(s) Signed: 09/18/2020 5:15:03 PM By: Donnamarie Poag Entered By: Donnamarie Poag on 09/18/2020 16:32:13 Steven Meza ([id number]) -------------------------------------------------------------------------------- Clinic Level of Care Assessment Details Patient Name: Steven Meza Date of Service: 09/18/2020 4:00 PM Medical Record Number: [id number] Patient Account Number: [id number] Date of Birth/Sex: 1931-02-16 (87 y.o. M) Treating RN: Donnamarie Poag Primary Care Sparrow Sanzo: Fulton Reek Other Clinician: Referring Travanti Mcmanus: Fulton Reek Treating Daley Gosse/Extender: Skipper Cliche in Treatment: 0 Clinic Level of Care Assessment Items TOOL 1 Quantity Score []  - Use when EandM and Procedure is performed on INITIAL visit 0 ASSESSMENTS - Nursing Assessment / Reassessment []  - General Physical Exam (combine w/ comprehensive assessment (listed just below) when performed on new 0 pt. evals) []  - 0 Comprehensive Assessment (HX, ROS, Risk Assessments, Wounds Hx, etc.) ASSESSMENTS - Wound and  Skin Assessment / Reassessment []  - Dermatologic / Skin Assessment (not related to wound area) 0 ASSESSMENTS - Ostomy and/or Continence Assessment and Care []  - Incontinence Assessment and Management 0 []  - 0 Ostomy Care Assessment and Management (repouching, etc.) PROCESS - Coordination of Care []  - Simple Patient / Family Education for ongoing care 0 []  - 0 Complex (extensive) Patient / Family Education for ongoing care []  - 0 Staff obtains Programmer, systems, Records, Test Results / Process Orders []  - 0 Staff telephones HHA, Nursing Homes / Clarify orders / etc []  - 0 Routine Transfer to another Facility (non-emergent condition) []  - 0 Routine Hospital Admission (non-emergent condition) []  - 0 New Admissions / Biomedical engineer / Ordering NPWT, Apligraf, etc. []  - 0 Emergency Hospital Admission (emergent condition) PROCESS - Special Needs []  - Pediatric / Minor Patient Management 0 []  - 0 Isolation Patient Management []  - 0 Hearing / Language / Visual special needs []  - 0 Assessment of Community assistance (transportation, D/C planning, etc.) []  - 0 Additional assistance / Altered mentation []  - 0 Support Surface(s) Assessment (bed, cushion, seat, etc.) INTERVENTIONS - Miscellaneous []  - External ear exam 0 []  - 0 Patient Transfer (multiple staff / Civil Service fast streamer / Similar devices) []  - 0 Simple Staple / Suture removal (25 or less) []  - 0 Complex Staple / Suture removal (26 or more) []  - 0 Hypo/Hyperglycemic Management (do not check if billed separately) []  - 0 Ankle / Brachial Index (ABI) - do not check if billed separately Has the patient been seen at the hospital within the last three years: Yes Total Score: 0 Level Of Care: ____ Steven Meza ([id number]) Electronic Signature(s) Signed: 09/18/2020 5:15:03 PM By: Donnamarie Poag Entered By: Donnamarie Poag on 09/18/2020 16:46:30 Steven Meza  ([id number]) -------------------------------------------------------------------------------- Compression Therapy Details Patient Name: Steven Meza Date of Service: 09/18/2020 4:00 PM Medical Record Number: [id number] Patient Account Number: [id number] Date of Birth/Sex: August 10, 1930 (87 y.o. M) Treating  RN: Donnamarie Poag Primary Care Tully Burgo: Fulton Reek Other Clinician: Referring Kylen Ismael: Fulton Reek Treating Shayma Pfefferle/Extender: Skipper Cliche in Treatment: 0 Compression Therapy Performed for Wound Assessment: NonWound Condition Lymphedema - Left Leg Performed By: Clinician Donnamarie Poag, RN Compression Type: Three Layer Electronic Signature(s) Signed: 09/18/2020 5:15:03 PM By: Donnamarie Poag Entered By: Donnamarie Poag on 09/18/2020 16:45:29 Steven Meza ([id number]) -------------------------------------------------------------------------------- Compression Therapy Details Patient Name: Steven Meza Date of Service: 09/18/2020 4:00 PM Medical Record Number: [id number] Patient Account Number: [id number] Date of Birth/Sex: December 31, 1930 (87 y.o. M) Treating RN: Donnamarie Poag Primary Care Uzziel Russey: Fulton Reek Other Clinician: Referring Shaquitta Burbridge: Fulton Reek Treating Kennis Buell/Extender: Skipper Cliche in Treatment: 0 Compression Therapy Performed for Wound Assessment: NonWound Condition Lymphedema - Right Leg Performed By: Clinician Donnamarie Poag, RN Compression Type: Three Layer Electronic Signature(s) Signed: 09/18/2020 5:15:03 PM By: Donnamarie Poag Entered By: Donnamarie Poag on 09/18/2020 16:45:51 Steven Meza ([id number]) -------------------------------------------------------------------------------- Encounter Discharge Information Details Patient Name: Steven Meza Date of Service: 09/18/2020 4:00 PM Medical Record Number: [id number] Patient Account Number: [id number] Date of Birth/Sex: Nov 09, 1930 (87 y.o. M) Treating RN: Donnamarie Poag Primary Care Svetlana Bagby:  Fulton Reek Other Clinician: Referring Tieara Flitton: Fulton Reek Treating Kambrea Carrasco/Extender: Skipper Cliche in Treatment: 0 Encounter Discharge Information Items Discharge Condition: Stable Ambulatory Status: Walker Discharge Destination: Home Transportation: Private Auto Accompanied By: wife Schedule Follow-up Appointment: Yes Clinical Summary of Care: Electronic Signature(s) Signed: 09/18/2020 5:15:03 PM By: Donnamarie Poag Entered ByDonnamarie Poag on 09/18/2020 16:46:24

## 2020-09-21 LAB — AEROBIC CULTURE W GRAM STAIN (SUPERFICIAL SPECIMEN)

## 2020-09-22 ENCOUNTER — Other Ambulatory Visit: Payer: Self-pay

## 2020-09-22 ENCOUNTER — Encounter: Payer: Medicare Other | Admitting: Internal Medicine

## 2020-09-22 DIAGNOSIS — L97219 Non-pressure chronic ulcer of right calf with unspecified severity: Secondary | ICD-10-CM | POA: Diagnosis not present

## 2020-09-24 NOTE — Progress Notes (Signed)
CRAWFORD, TAMURA ([id number]) Visit Report for 09/22/2020 HPI Details Patient Name: Steven Meza, Steven Meza Date of Service: 09/22/2020 1:30 PM Medical Record Number: [id number] Patient Account Number: [id number] Date of Birth/Sex: [birth year] (87 y.o. M) Treating RN: Primary Care Provider: Fulton Reek Other Clinician: Referring Provider: Fulton Reek Treating Provider/Extender: Tito Dine in Treatment: 1 History of Present Illness HPI Description: ADMISSION 05/17/2018 Steven Meza is an 87 year old man who is been treated at the lymphedema clinic for a long period with lymphedema wraps for bilateral lower extremity lymphedema. About 6 months ago they noted small open areas on his left lateral calf just above the ankle. These were open. They are apparently putting some form of ointment on this. They have been referred here for our review of this. The patient has a long history of lymphedema and venous stasis. It says in his records that he has recurrent cellulitis although his wife denies this but she states that the lymphedema may have started with cellulitis several years ago. Also in his record there is a history of bladder cancer which they seem to know very little about however he did not have any radiation to the pelvis or anything that could have contributed to lymphedema that they are aware of. There is no prior wound history. The patient has 2 small punched out areas on the left lateral calf that have adherent debris on the surface. Past medical history; lymphedema, hypertension, cellulitis, hearing loss, morbid obesity, osteoarthritis, venous stasis, bladder CA, diverticulosis. ABIs in our clinic were 0.36 on the right and 0.57 on the left 05/24/2018; corrected age on this patient is 48 versus what I said last week. He has been for his arterial studies which predictably are not very good. On the right his ABI is 0.65. Monophasic waveforms noted at the right ankle including the  posterior tibial and dorsalis pedis. He has triphasic waveforms of the common femoral and profunda femoris triphasic proximal SFA with monophasic distal FSA and popliteal artery. Monophasic tibial waveforms. On the left his ABI is 0.58 monophasic waveforms at the left ankle. Duplex of the left lower extremity demonstrated atherosclerotic change with monophasic waveforms throughout the left lower extremity. There was some concern about left iliac disease and potentially femoral-popliteal and tibial disease. The patient does not describe claudication although according to his wife he over emphasizes his activity. Patient states he is limited by pain in both knees His wounds are on the left lateral calf. 2 small areas with necrotic debris. We have been using silver collagen and light Ace wraps 05/31/2018; wounds on the left lateral calf in the setting of very significant lymphedema and probably PAD. We have been using silver collagen. The base of the small wound looks reasonably improved. I sent him to see Dr. Fletcher Anon however I see now he has an appointment with Dr. Alvester Chou on February 12 2/5; left lateral calf wound looks the same. His wife is doing a good job with her lymphedema wraps and maintaining that the edema. He most likely has PAD and is due to see Dr. Gwenlyn Found of infectious disease on February 12 2/19; left lateral calf wounds look about the same. This looks like wound secondary to chronic venous insufficiency with lymphedema however the patient has very poor arterial studies. We referred him to Dr. Gwenlyn Found. Dr. Gwenlyn Found did not feel he needed to do anything from an arterial point of view. He did not address the question about the aggressiveness of compression. After some thoughts about this I  elected to go ahead and put him in 3 layer compression which after all would be less compression then the lymphedema clinic was putting on this. I am hopeful that this should be enough to get some closure of the  small wounds 2/26; left lateral calf wound letter this week. He tolerated the 3 layer compression well furthermore he is sleeping in a hospital bed which helps keep his legs up at night. The wound looks better measuring smaller especially in width 3/3; left lateral calf wound about the same size. The 3 layer compression has really helped get the edema down in his leg however. Using silver collagen 3/11; left lateral calf wound better looking wound surface but about the same size. We have been using 3 layer compression which is really helped get the edema down in the left leg. Even after Dr. Kennon Holter assessment I am reluctant to go to 4 layer compression. He is tolerating 3 layer compression well. We have been using silver collagen 3/18-Patient returns for his left lateral calf wound which overall is looking better, slightly increased in size and dimensions and area, he is tolerating the 3 layer compression, has been dressed with silver collagen which we will continue 3/25oleft lateral calf wound much better looking. Size is smaller. He has been using silver collagen 4/1; gradually getting smaller in size. Using silver collagen with 3 layer compression. I think the patient has some degree of PAD. He saw Dr. Gwenlyn Found in consultation, he did not specifically comment on whether he could tolerate 4 layer compression 4/8; the wound is slightly smaller in depth. We are using 3 layer compression with silver collagen. I had him seen for arterial insufficiency however his ABI was 0.57 in the left leg. Dr. Gwenlyn Found really did not seem to middle on the degree of compression he could tolerate 4/15; the wound is slightly smaller and slightly less deep. We have been using 3 layer compression with silver collagen. There is not an option for 4-layer compression here. 4/22; the patient is making decent progress on his wound on the left lateral calf however he arrives in with a superficial wound on the right anterior tibial  area. Were not really sure how this happened. He had been using a stocking on the right leg. He has been using silver collagen on the wounds Amazing to me he is he appears to have compression pumps at home. I am not really sure I knew this before. In any case he has not been using them 4/29; left lateral calf wound continues to get gradually smaller. This looks like it is on its way to closure. oThe area on the right anterior tibial area is about the same in terms of size superficial without any depth. This was probably wrap injury oHe tells me that he has been using his compression pumps once a day for 1 hour without any pain 5/6; not as good today. Left lateral calf wound is actually larger and he does not have quite as good edema control. The area on the right anterior Steven Meza, Steven A. ([id number]) tibia is about the same still superficial. He states he is using his compression pumps once a day. We are using 3 layer compression on the left and Curlex Coban on the right 5/13; the area on the right is very close to being healed. The area on the left still has some depth but has a healthy wound surface. He has excellent edema control bilaterally. Using Hydrofera Steven Meza 5/20-Patient returns in 1  week after being in 3 layer compression on the left, the left lateral calf ulcer is stable with some slough, the right anterior tibial ulcer is healed we have been using Kerlix Coban on that leg 5/27; Hydrofera Steven Meza and 3 layer compression. Still some slough in the wound area and some nonviable edges around the wound. 6/3; this patient has lymphedema and chronic venous insufficiency. He had difficult to close wound on his left lateral calf. This is closed today. 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 Finally he has known PAD but he is managed to tolerate the compression we reporting on him and also using his compression pumps that he already had at home twice a  day 6/10; we discharge this patient last week having finally close the small area in the left lateral calf. Apparently by Friday of last week this had opened and was draining. He is therefore back in clinic. His wife who is present also was concerned about an area on the right anterior tibia."o 6/17; the patient does not have an open area on the right leg. On the left he has a small circular area anteriorly and the same area laterally that he had last time. However both of these appear to be improved he has his [place] wrap 30/40. It turns out that he is only using his external compression pumps once a day. I tried to get him up to twice a day today 6/24; the patient comes in with his external compression garment on the right leg/Farrow wrap. As far as we know no open wounds here. He has 2 areas that are smaller one on the left anterior pretibial and one on the left lateral calf which is the site of his initial wounds. I have successfully caught him into his external compression pumps twice a day 7/1; still using his external compression garment on the right leg. As far as we know there are no open wounds here. The area on the left lateral calf which was the site of his initial wound has closed over. The area on the left anterior pretibial that we identified last week is still open. 7/8; as far as we know he has no open wounds on the right leg. He has his external compression garment on here. The original wound on the left lateral calf has closed over. He developed an area on the left anterior pretibial 2 weeks ago and seems to have 2 smaller areas on the proximal and distal lateral calf. We do not have his good edema control this week as I am used to seeing 7/15; the patient has still a linear area on the left anterior tibia. The area on the left lateral leg looked somewhat inflamed today but no cellulitis. He did have what looked to be a small pustule that I cultured for CandS although I did not give  him empiric antibiotics. 7/22; small area on the left anterior tibia. The area on the left lateral leg still looks inflamed. The pustule that I cultured last week grew Enterobacter and Pseudomonas. I prescribed Cipro they still have not picked that up. In any case the area actually looks fairly satisfactory. He has decent edema control using his compression pumps twice a day 7/29; the area on the left lateral leg looks a lot better than last week. Nothing is open here. He has an area just medial to the mid anterior tibia that is still open that is only open wound today. 8/5-Patient  returns at 1 week for the left leg ulcer, which is actually looking better, he is also complaining of left plantar foot pain below the great toe. He does have a hematoma here 8/12-Patient returns at 1 week, the left leg open area looks about the same if not slightly better, the left plantar hematoma under the great toe is about the same, patient apparently has not been resting his foot on anything for too long period of time and does not wear the [employer]kenstock shoes unless he walks outdoors which he does not do often 8/19; small wound on the left anterior lower leg this is just about closed there is nothing on the left lateral leg. He is using his compression pumps He arrives in clinic today with a black eschar over the right first plantar metatarsal head. This was apparently a bruise that was identified by her nurses last week. He wears crocs on his feet. He also may rub these on a footboard at home 8/26; there is nothing open on the left lateral lower leg he has a small area just medial to the tibia. He is going to see his podiatrist Dr. Milinda Pointer about the area on the left plantar first metatarsal head. He is using his external compression pumps. He has a juxta lite on the right leg 9/2; there is nothing open on either side of her left lower leg. He went to see podiatry about the area on the left first metatarsal head. This  was apparently debrided. He was given mupirocin and oral antibiotics. I do not believe he is offloading this at all. Readmission: 04/02/2019 upon evaluation today patient actually appears for reevaluation here in our clinic secondary to issues that he is having with his left lower extremity. He has been tolerating the dressing changes without complication. Fortunately there is no signs of active infection at this time. No fevers, chills, nausea, vomiting, or diarrhea. He has been in the hospital since we last saw him and then subsequently transferred back to [facility] He tells me that they have been changing his dressing once a day and that he has had a significant amount of pain secondary to undergoing these dressing changes. Fortunately there is no evidence of active infection at this point which is good news. No fevers, chills, nausea, vomiting, or diarrhea. With that being said he wonders if we can go back to doing what we were doing before which was when he actually had the dressing changes performed once a week here in the clinic he tells me that he did very well with that. At that point he tolerated a 3 layer compression wrap which was approved by vascular, Dr. Tyrell Antonio office, to be used previous although 4-layer compression wrap is probably too much for him. 04/16/2019 on evaluation today patient appears to be doing decently well with regard to his lower extremity although he has had a lot of drainage from his leg. A lot of this I think is just secondary to the lymphedema with that being said he does have some odor as well which may again just be due to more of the excessive moisture although I think that he may also have something that is a result of bacteria again he has somewhat of a Pseudomonas-like smell to the drainage and fortunately though he is not showing any signs of severe cellulitis he is still experiencing quite a bit of drainage and the discoloration is somewhat of a  yellow/green in color which is consistent with Pseudomonas.  If he can tolerated I think he may do well with Cipro. 04/24/2019 on evaluation today patient appears to be doing well with regard to his wound in the gluteal region which is healing quite well. That is excellent news. With that being said he is also doing better with regard to his left lower extremity overall I feel like things are definitely showing signs of improvement. 12/28-Patient returns with regards to his gluteal wound that is improving, Left lower leg wounds with significant edema in the lower legs that appear to be persistent. Patient is in compression and has lymphedema pumps at home but apparently he does not use consistently 05/07/2019 Upon inspection patient's wound bed actually showed signs on evaluation today patient's wound currently showed signs of improvement. There is still a lot of maceration but it does look like that there is been some of this maceration occurring as a result of drainage in particular and some as a result of the fact that they have been putting bag balm on the wound area as well as the leg in general. I think this is applying too much moisture and not doing him any good. I discussed with the patient and his wife today that I think that practice needs to be adjusted I would recommend using a different type of lotion in order to help with moisture around the wound but I would not recommend anything directly on the wound itself. 05/14/2019 on evaluation today patient actually appears to be doing much better in regard to his lower extremity. They have not been using any of the bag balm on the area I think this has made a tremendous improvement overall in his wounds. He has a lot of new skin growth and though Steven Meza, Steven Meza ([id number]) there still are a lot of open wounds scattered throughout the circumferential lower leg this overall seems to be doing much better to me. 05/21/2019 upon evaluation today  patient appears to be doing well in regard to his lower extremity although he is having unfortunately some Steven Meza- green drainage noted at this point. The does have me concerned for a reinitiation of the Pseudomonas infection that we previously treated in December. The last time I gave him a prescription was April 16, 2019. That was a little over a month ago. Nonetheless he does have more green drainage noted today and I think this is indicative of potentially a revving up infection. Obviously we do not want to get to that point. 05/28/2019 on evaluation today patient appears to be doing better with regard to his lower extremity at this point. Overall I am seeing signs of improvement which is good news. Fortunately there is no evidence of active infection at this time. I do believe that the Cipro has been beneficial for him that was prescribed last week for 14 days he is halfway through that and already appears to be doing much better. This is excellent news. 06/04/2019 upon evaluation today I do believe that the patient is doing better with regard to his lower extremity. I feel like that he has much less in the way of open wounds and drainage and much in provement in regard to the overall weeping and even his swelling. There is no signs of erythema no obvious evidence of active infection all of which is great news. No fevers, chills, nausea, vomiting, or diarrhea. 06/11/2019 on evaluation today patient appears to be doing better with regard to his lower extremity on the left. He has been tolerating dressing  changes without complication. Fortunately there is no signs of active infection at this time. No fevers, chills, nausea, vomiting, or diarrhea. 06/18/2019 upon evaluation today patient appears to be doing slightly worse compared to last week. They did call me on Friday due to the fact that he had green discharge and both his wife as well as his home health nurse were concerned about what they were  seeing. Subsequently I did actually send in a prescription for Cipro for 14 days for him and according to his wife this seems to be doing better based on what she is seeing today compared to Friday. This is great news. Home health is still coming out to see him on Wednesdays and Fridays as well. 06/25/2019 upon evaluation today patient appears to be doing somewhat better in regard to his leg ulcer. Fortunately there is no signs of active infection at this time which is good news. With that being said I am very pleased with how things seem to be going and overall I see no major issues here I think the alginate is doing well along with the XtraSorb. With regard to the patient's plantar foot he does have somewhat of a blood blister that appears to be resolving at this point his wife states that she feels like the issue is that he is not dropping his shoe in order to keep it from sliding when he walks. Subsequently this is leading to increases in friction which of course is leading to more issues with getting this area to heal the patient is struggling in that regard. 07/02/2019 upon evaluation today patient appears to be making some progress in regard to his lower extremities. With that being said I really feel like the alginate may be getting too moist and then sitting on the skin causing some complications here for [place]. I discussed with the patient and his wife today as well the possibility of going ahead and discontinuing the alginate just utilizing the Xtrasorb to try to keep the edema under better control while at the same time preventing any dressings from staying too moist right on the surface of the wounds. Obviously I think this can do a good job in helping to dry things out but I would recommend that we give this a shot. 07/09/2019 upon evaluation today patient appears to be doing better in regard to some of the edema and weeping in regard to the left lower extremity. Unfortunately he has not been  wearing his compression of the right lower extremity and now that is open as well. He needs to obviously be wearing this although for today we will get a half to wrap him due to the fact that this is now open. I explained that he does need to be wearing his compression at all times. 07/16/2019 upon evaluation today patient actually does appear to be making some progress in regard to his lower extremity ulcers at this point. In fact he really does not have any ulcerations as much as he just does have lymphedema breakdown causing weeping. Nonetheless he is drying up quite a bit but overall seems to be doing much better. He has a lot of dry flaky skin I think we may want to add triamcinolone to the skin to try to help out with cutting back on some of the irritation here hopefully improve the overall skin quality. He does have Velcro compression wraps once he heals that is good news as well. 07/23/2019 upon evaluation today patient appears to be  doing better with regard to his bilateral lower extremities. I think we are getting closer to getting everything sealed up. Again he does not have any significant wounds and his lymphedema has dramatically improved. 07/30/2019 upon evaluation today patient appears to be doing excellent in regard to his bilateral lower extremities. Fortunately there is no signs of active infection at this time. Overall the patient seems to be in fact completely sealed up as far as any areas of weeping or concern which also has me thrilled based on what we are seeing currently. Overall I think that he is doing extremely well. 09/03/2019 upon evaluation today patient appears to be doing somewhat poorly in regard to his left lower extremity with regard to open wounds. Fortunately there is nothing that appears to be draining too terribly although he has about 3 different areas that are open as far as wounds are concerned all 3 seem to be fairly superficial. He was discharged from our clinic  on 07/30/2019. Subsequently he reopened in the interim apparently at home health never completely discharged home from being seen. They in fact have still continue to wrap his left leg up until now. 09/10/2019 upon evaluation today patient actually appears to be doing better with regard to his lower extremities. Fortunately there is no signs of active infection at this time. No fevers, chills, nausea, vomiting, or diarrhea. Overall been fairly pleased with how things seem to be progressing at this point. The patient is likewise happy that his legs look better. He tells me he has been elevating more. With that being said he does not really commit to exactly how much more and his wife tells me he still spends a lot of time with his feet on the ground. 5/19; have not seen this patient in quite some time. He had wounds on his left lower extremity. These are all healed today. He has juxta lites for both legs. He has 1 on the right leg it looks as though he has been putting this on well. His wife emphasizes that he lotions his own legs and puts on his own stocking. He also has external compression pumps that he uses twice a day Readmission: 09/15/2020 upon evaluation today patient appears to be doing somewhat poorly in regard to his bilateral lower extremities. He seems to likely have cellulitis. He is having weeping and drainage due to lymphedema but I do not see any evidence of actual systemic infection at this point which is great news. With that being said I do feel like that this is likely continue to be addressed with some oral antibiotics at minimum. If he gets any worse he is probably need to go to the hospital. The patient's ABI when last checked by Dr. Alvester Chou was on the right 0.65 and the left 0.58 but it was felt that he could tolerate compression and subsequently he is always tolerated the 3 layer compression wrap. Again he does have a history of lymphedema, chronic venous insufficiency,  hypertension, coronary artery disease, and callus buildup on his feet. 5/23 the patient cultured predominantly staph aureus last time however a few Pseudomonas. He has been on Bactrim which would probably not cover the Pseudomonas however that cellulitis in his right leg is completely resolved. HOWEVER he has an open area in the right posterior calf. No open wound on the left he has not been using his compression pumps because he was not sure he had 2 with the infection present Steven Meza, Steven Meza ([id number]) Electronic Signature(s)  Signed: 09/22/2020 3:42:13 PM By: Linton Ham MD Entered By: Linton Ham on 09/22/2020 14:12:12 Steven Meza ([id number]) -------------------------------------------------------------------------------- Physical Exam Details Patient Name: Steven Meza Date of Service: 09/22/2020 1:30 PM Medical Record Number: [id number] Patient Account Number: [id number] Date of Birth/Sex: [birth year] (87 y.o. M) Treating RN: Primary Care Provider: Fulton Reek Other Clinician: Referring Provider: Fulton Reek Treating Provider/Extender: Tito Dine in Treatment: 1 Constitutional Patient is hypertensive.. Pulse regular and within target range for patient.Marland Kitchen Respirations regular, non-labored and within target range.. Temperature is normal and within the target range for the patient.Marland Kitchen appears in no distress. Notes Wound exam; there is a new open area on the right posterior calf. Although his cellulitis untold is a lot better there is absolutely no tenderness. Major open area now on the right posterior calf weepy erythematous and not completely without surface debris. He has still has obvious lymphedema but no obvious tenderness Electronic Signature(s) Signed: 09/22/2020 3:42:13 PM By: Linton Ham MD Entered By: Linton Ham on 09/22/2020 14:14:32 Steven Meza  ([id number]) -------------------------------------------------------------------------------- Physician Orders Details Patient Name: Steven Meza Date of Service: 09/22/2020 1:30 PM Medical Record Number: [id number] Patient Account Number: [id number] Date of Birth/Sex: 11-26-1930 (87 y.o. M) Treating RN: Cornell Barman Primary Care Provider: Fulton Reek Other Clinician: Referring Provider: Fulton Reek Treating Provider/Extender: Tito Dine in Treatment: 1 Verbal / Phone Orders: No Diagnosis Coding Follow-up Appointments o Return Appointment in 1 week. o Nurse Visit as needed Edema Control - Lymphedema / Segmental Compressive Device / Other Bilateral Lower Extremities o Optional: One layer of unna paste to top of compression wrap (to act as an anchor). o 3 Layer Compression System for Lymphedema. - BILATERAL 3 LAYER COMPRESSION WRAP o Patient to wear own Velcro compression garment. Remove compression stockings every night before going to bed and put on every morning when getting up. - REQUESTS THAT FARROW BE REORDERED AS PRIOR WORN OUT. Gave patient's wife information to call [facility] regarding Lenore Cordia. o Elevate, Exercise Daily and Avoid Standing for Long Periods of Time. o Elevate legs to the level of the heart and pump ankles as often as possible o Compression Pump: Use compression pump on left lower extremity for 60 minutes, twice daily. o Compression Pump: Use compression pump on right lower extremity for 60 minutes, twice daily. o DO YOUR BEST to sleep in the bed at night. DO NOT sleep in your recliner. Long hours of sitting in a recliner leads to swelling of the legs and/or potential wounds on your backside. o Other: - CONTINUE antibiotics as ordered for cellulitis lower legs Wound Treatment Wound #11 - Lower Leg Wound Laterality: Right, Posterior Primary Dressing: Silvercel 4 1/4x 4 1/4 (in/in) Discharge Instructions: Apply Silvercel 4  1/4x 4 1/4 (in/in) as instructed Secondary Dressing: Xtrasorb Medium 4x5 (in/in) Discharge Instructions: Apply to wound as directed. Do not cut. Compression Wrap: Profore Lite LF 3 Multilayer Compression Bandaging System Discharge Instructions: Apply 3 multi-layer wrap as prescribed. Electronic Signature(s) Signed: 09/22/2020 3:42:13 PM By: Linton Ham MD Signed: 09/23/2020 5:46:47 PM By: Gretta Cool BSN, RN, CWS, Kim RN, BSN Entered By: Gretta Cool, BSN, RN, CWS, Kim on 09/22/2020 14:30:31 Steven Meza ([id number]) -------------------------------------------------------------------------------- Problem List Details Patient Name: Steven Meza Date of Service: 09/22/2020 1:30 PM Medical Record Number: [id number] Patient Account Number: [id number] Date of Birth/Sex: 1930/07/15 (87 y.o. 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 09/22/2020 No Yes I89.0 Lymphedema, not elsewhere classified 09/15/2020 No Yes I87.2 Venous insufficiency (chronic) (peripheral) 09/15/2020 No Yes Inactive Problems ICD-10 Meza Description Active Date Inactive Date I10 Essential (primary) hypertension 09/15/2020 09/15/2020 I25.10 Atherosclerotic heart disease of native coronary artery without angina pectoris 09/15/2020 09/15/2020 L84 Corns and callosities 09/15/2020 09/15/2020 Resolved Problems Electronic Signature(s) Signed: 09/22/2020 3:42:13 PM By: Linton Ham MD Entered By: Linton Ham on 09/22/2020 14:10:32 Steven Meza ([id number]) -------------------------------------------------------------------------------- Progress Note Details Patient Name: Steven Meza Date of Service: 09/22/2020 1:30  PM Medical Record Number: [id number] Patient Account Number: [id number] Date of Birth/Sex: Oct 23, 1930 (87 y.o. M) Treating RN: Primary Care Provider: Fulton Reek Other Clinician: Referring Provider: Fulton Reek Treating Provider/Extender: Tito Dine in Treatment: 1 Subjective History of Present Illness (HPI) ADMISSION 05/17/2018 Mr. Pare is an 87 year old man who is been treated at the lymphedema clinic for a long period with lymphedema wraps for bilateral lower extremity lymphedema. About 6 months ago they noted small open areas on his left lateral calf just above the ankle. These were open. They are apparently putting some form of ointment on this. They have been referred here for our review of this. The patient has a long history of lymphedema and venous stasis. It says in his records that he has recurrent cellulitis although his wife denies this but she states that the lymphedema may have started with cellulitis several years ago. Also in his record there is a history of bladder cancer which they seem to know very little about however he did not have any radiation to the pelvis or anything that could have contributed to lymphedema that they are aware of. There is no prior wound history. The patient has 2 small punched out areas on the left lateral calf that have adherent debris on the surface. Past medical history; lymphedema, hypertension, cellulitis, hearing loss, morbid obesity, osteoarthritis, venous stasis, bladder CA, diverticulosis. ABIs in our clinic were 0.36 on the right and 0.57 on the left 05/24/2018; corrected age on this patient is 69 versus what I said last week. He has been for his arterial studies which predictably are not very good. On the right his ABI is 0.65. Monophasic waveforms noted at the right ankle including the posterior tibial and dorsalis pedis. He has triphasic waveforms of the common femoral and profunda femoris triphasic proximal SFA with  monophasic distal FSA and popliteal artery. Monophasic tibial waveforms. On the left his ABI is 0.58 monophasic waveforms at the left ankle. Duplex of the left lower extremity demonstrated atherosclerotic change with monophasic waveforms throughout the left lower extremity. There was some concern about left iliac disease and potentially femoral-popliteal and tibial disease. The patient does not describe claudication although according to his wife he over emphasizes his activity. Patient states he is limited by pain in both knees His wounds are on the left lateral calf. 2 small areas with necrotic debris. We have been using silver collagen and light Ace wraps 05/31/2018; wounds on the left lateral calf in the setting of very significant lymphedema and probably PAD. We have been using silver collagen. The base of the small wound looks reasonably improved. I sent him to see Dr. Fletcher Anon however I see now he has an appointment with Dr. Alvester Chou on February 12 2/5; left lateral calf wound looks the same. His wife is doing a good  job with her lymphedema wraps and maintaining that the edema. He most likely has PAD and is due to see Dr. Gwenlyn Found of infectious disease on February 12 2/19; left lateral calf wounds look about the same. This looks like wound secondary to chronic venous insufficiency with lymphedema however the patient has very poor arterial studies. We referred him to Dr. Gwenlyn Found. Dr. Gwenlyn Found did not feel he needed to do anything from an arterial point of view. He did not address the question about the aggressiveness of compression. After some thoughts about this I elected to go ahead and put him in 3 layer compression which after all would be less compression then the lymphedema clinic was putting on this. I am hopeful that this should be enough to get some closure of the small wounds 2/26; left lateral calf wound letter this week. He tolerated the 3 layer compression well furthermore he is sleeping in a  hospital bed which helps keep his legs up at night. The wound looks better measuring smaller especially in width 3/3; left lateral calf wound about the same size. The 3 layer compression has really helped get the edema down in his leg however. Using silver collagen 3/11; left lateral calf wound better looking wound surface but about the same size. We have been using 3 layer compression which is really helped get the edema down in the left leg. Even after Dr. Kennon Holter assessment I am reluctant to go to 4 layer compression. He is tolerating 3 layer compression well. We have been using silver collagen 3/18-Patient returns for his left lateral calf wound which overall is looking better, slightly increased in size and dimensions and area, he is tolerating the 3 layer compression, has been dressed with silver collagen which we will continue 3/25 left lateral calf wound much better looking. Size is smaller. He has been using silver collagen 4/1; gradually getting smaller in size. Using silver collagen with 3 layer compression. I think the patient has some degree of PAD. He saw Dr. Gwenlyn Found in consultation, he did not specifically comment on whether he could tolerate 4 layer compression 4/8; the wound is slightly smaller in depth. We are using 3 layer compression with silver collagen. I had him seen for arterial insufficiency however his ABI was 0.57 in the left leg. Dr. Gwenlyn Found really did not seem to middle on the degree of compression he could tolerate 4/15; the wound is slightly smaller and slightly less deep. We have been using 3 layer compression with silver collagen. There is not an option for 4-layer compression here. 4/22; the patient is making decent progress on his wound on the left lateral calf however he arrives in with a superficial wound on the right anterior tibial area. Were not really sure how this happened. He had been using a stocking on the right leg. He has been using silver collagen on the  wounds Amazing to me he is he appears to have compression pumps at home. I am not really sure I knew this before. In any case he has not been using them 4/29; left lateral calf wound continues to get gradually smaller. This looks like it is on its way to closure. The area on the right anterior tibial area is about the same in terms of size superficial without any depth. This was probably wrap injury He tells me that he has been using his compression pumps once a day for 1 hour without any pain 5/6; not as good today. Left lateral calf wound  is actually larger and he does not have quite as good edema control. The area on the right anterior tibia is about the same still superficial. He states he is using his compression pumps once a day. We are using 3 layer compression on the left and Curlex Coban on the right 5/13; the area on the right is very close to being healed. The area on the left still has some depth but has a healthy wound surface. He has excellent edema control bilaterally. Using [facility] KELON, EASOM ([id number]) 5/20-Patient returns in 1 week after being in 3 layer compression on the left, the left lateral calf ulcer is stable with some slough, the right anterior tibial ulcer is healed we have been using Kerlix Coban on that leg 5/27; Hydrofera Steven Meza and 3 layer compression. Still some slough in the wound area and some nonviable edges around the wound. 6/3; this patient has lymphedema and chronic venous insufficiency. He had difficult to close wound on his left lateral calf. This is closed today. 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 Finally he has known PAD but he is managed to tolerate the compression we reporting on him and also using his compression pumps that he already had at home twice a day 6/10; we discharge this patient last week having finally close the small area in the left lateral calf. Apparently by Friday of last week  this had opened and was draining. He is therefore back in clinic. His wife who is present also was concerned about an area on the right anterior tibia."o 6/17; the patient does not have an open area on the right leg. On the left he has a small circular area anteriorly and the same area laterally that he had last time. However both of these appear to be improved he has his Farrow wrap 30/40. It turns out that he is only using his external compression pumps once a day. I tried to get him up to twice a day today 6/24; the patient comes in with his external compression garment on the right leg/Farrow wrap. As far as we know no open wounds here. He has 2 areas that are smaller one on the left anterior pretibial and one on the left lateral calf which is the site of his initial wounds. I have successfully caught him into his external compression pumps twice a day 7/1; still using his external compression garment on the right leg. As far as we know there are no open wounds here. The area on the left lateral calf which was the site of his initial wound has closed over. The area on the left anterior pretibial that we identified last week is still open. 7/8; as far as we know he has no open wounds on the right leg. He has his external compression garment on here. The original wound on the left lateral calf has closed over. He developed an area on the left anterior pretibial 2 weeks ago and seems to have 2 smaller areas on the proximal and distal lateral calf. We do not have his good edema control this week as I am used to seeing 7/15; the patient has still a linear area on the left anterior tibia. The area on the left lateral leg looked somewhat inflamed today but no cellulitis. He did have what looked to be a small pustule that I cultured for CandS although I did not give him empiric antibiotics. 7/22; small area on the left  anterior tibia. The area on the left lateral leg still looks inflamed. The pustule that  I cultured last week grew Enterobacter and Pseudomonas. I prescribed Cipro they still have not picked that up. In any case the area actually looks fairly satisfactory. He has decent edema control using his compression pumps twice a day 7/29; the area on the left lateral leg looks a lot better than last week. Nothing is open here. He has an area just medial to the mid anterior tibia that is still open that is only open wound today. 8/5-Patient returns at 1 week for the left leg ulcer, which is actually looking better, he is also complaining of left plantar foot pain below the great toe. He does have a hematoma here 8/12-Patient returns at 1 week, the left leg open area looks about the same if not slightly better, the left plantar hematoma under the great toe is about the same, patient apparently has not been resting his foot on anything for too long period of time and does not wear the [employer]kenstock shoes unless he walks outdoors which he does not do often 8/19; small wound on the left anterior lower leg this is just about closed there is nothing on the left lateral leg. He is using his compression pumps He arrives in clinic today with a black eschar over the right first plantar metatarsal head. This was apparently a bruise that was identified by her nurses last week. He wears crocs on his feet. He also may rub these on a footboard at home 8/26; there is nothing open on the left lateral lower leg he has a small area just medial to the tibia. He is going to see his podiatrist Dr. Milinda Pointer about the area on the left plantar first metatarsal head. He is using his external compression pumps. He has a juxta lite on the right leg 9/2; there is nothing open on either side of her left lower leg. He went to see podiatry about the area on the left first metatarsal head. This was apparently debrided. He was given mupirocin and oral antibiotics. I do not believe he is offloading this at all. Readmission: 04/02/2019  upon evaluation today patient actually appears for reevaluation here in our clinic secondary to issues that he is having with his left lower extremity. He has been tolerating the dressing changes without complication. Fortunately there is no signs of active infection at this time. No fevers, chills, nausea, vomiting, or diarrhea. He has been in the hospital since we last saw him and then subsequently transferred back to [facility]. He tells me that they have been changing his dressing once a day and that he has had a significant amount of pain secondary to undergoing these dressing changes. Fortunately there is no evidence of active infection at this point which is good news. No fevers, chills, nausea, vomiting, or diarrhea. With that being said he wonders if we can go back to doing what we were doing before which was when he actually had the dressing changes performed once a week here in the clinic he tells me that he did very well with that. At that point he tolerated a 3 layer compression wrap which was approved by vascular, Dr. Tyrell Antonio office, to be used previous although 4-layer compression wrap is probably too much for him. 04/16/2019 on evaluation today patient appears to be doing decently well with regard to his lower extremity although he has had a lot of drainage from his leg. A lot  of this I think is just secondary to the lymphedema with that being said he does have some odor as well which may again just be due to more of the excessive moisture although I think that he may also have something that is a result of bacteria again he has somewhat of a Pseudomonas-like smell to the drainage and fortunately though he is not showing any signs of severe cellulitis he is still experiencing quite a bit of drainage and the discoloration is somewhat of a yellow/green in color which is consistent with Pseudomonas. If he can tolerated I think he may do well with Cipro. 04/24/2019 on evaluation today  patient appears to be doing well with regard to his wound in the gluteal region which is healing quite well. That is excellent news. With that being said he is also doing better with regard to his left lower extremity overall I feel like things are definitely showing signs of improvement. 12/28-Patient returns with regards to his gluteal wound that is improving, Left lower leg wounds with significant edema in the lower legs that appear to be persistent. Patient is in compression and has lymphedema pumps at home but apparently he does not use consistently 05/07/2019 Upon inspection patient's wound bed actually showed signs on evaluation today patient's wound currently showed signs of improvement. There is still a lot of maceration but it does look like that there is been some of this maceration occurring as a result of drainage in particular and some as a result of the fact that they have been putting bag balm on the wound area as well as the leg in general. I think this is applying too much moisture and not doing him any good. I discussed with the patient and his wife today that I think that practice needs to be adjusted I would recommend using a different type of lotion in order to help with moisture around the wound but I would not recommend anything directly on the wound itself. 05/14/2019 on evaluation today patient actually appears to be doing much better in regard to his lower extremity. They have not been using any of the bag balm on the area I think this has made a tremendous improvement overall in his wounds. He has a lot of new skin growth and though there still are a lot of open wounds scattered throughout the circumferential lower leg this overall seems to be doing much better to me. 05/21/2019 upon evaluation today patient appears to be doing well in regard to his lower extremity although he is having unfortunately some Steven Meza- green drainage noted at this point. The does have me concerned for  a reinitiation of the Pseudomonas infection that we previously treated in Steven Meza, Steven Meza ([id number]) December. The last time I gave him a prescription was April 16, 2019. That was a little over a month ago. Nonetheless he does have more green drainage noted today and I think this is indicative of potentially a revving up infection. Obviously we do not want to get to that point. 05/28/2019 on evaluation today patient appears to be doing better with regard to his lower extremity at this point. Overall I am seeing signs of improvement which is good news. Fortunately there is no evidence of active infection at this time. I do believe that the Cipro has been beneficial for him that was prescribed last week for 14 days he is halfway through that and already appears to be doing much better. This is excellent news. 06/04/2019 upon  evaluation today I do believe that the patient is doing better with regard to his lower extremity. I feel like that he has much less in the way of open wounds and drainage and much in provement in regard to the overall weeping and even his swelling. There is no signs of erythema no obvious evidence of active infection all of which is great news. No fevers, chills, nausea, vomiting, or diarrhea. 06/11/2019 on evaluation today patient appears to be doing better with regard to his lower extremity on the left. He has been tolerating dressing changes without complication. Fortunately there is no signs of active infection at this time. No fevers, chills, nausea, vomiting, or diarrhea. 06/18/2019 upon evaluation today patient appears to be doing slightly worse compared to last week. They did call me on Friday due to the fact that he had green discharge and both his wife as well as his home health nurse were concerned about what they were seeing. Subsequently I did actually send in a prescription for Cipro for 14 days for him and according to his wife this seems to be doing better based on  what she is seeing today compared to Friday. This is great news. Home health is still coming out to see him on Wednesdays and Fridays as well. 06/25/2019 upon evaluation today patient appears to be doing somewhat better in regard to his leg ulcer. Fortunately there is no signs of active infection at this time which is good news. With that being said I am very pleased with how things seem to be going and overall I see no major issues here I think the alginate is doing well along with the XtraSorb. With regard to the patient's plantar foot he does have somewhat of a blood blister that appears to be resolving at this point his wife states that she feels like the issue is that he is not dropping his shoe in order to keep it from sliding when he walks. Subsequently this is leading to increases in friction which of course is leading to more issues with getting this area to heal the patient is struggling in that regard. 07/02/2019 upon evaluation today patient appears to be making some progress in regard to his lower extremities. With that being said I really feel like the alginate may be getting too moist and then sitting on the skin causing some complications here for [place]. I discussed with the patient and his wife today as well the possibility of going ahead and discontinuing the alginate just utilizing the Xtrasorb to try to keep the edema under better control while at the same time preventing any dressings from staying too moist right on the surface of the wounds. Obviously I think this can do a good job in helping to dry things out but I would recommend that we give this a shot. 07/09/2019 upon evaluation today patient appears to be doing better in regard to some of the edema and weeping in regard to the left lower extremity. Unfortunately he has not been wearing his compression of the right lower extremity and now that is open as well. He needs to obviously be wearing this although for today we will get a  half to wrap him due to the fact that this is now open. I explained that he does need to be wearing his compression at all times. 07/16/2019 upon evaluation today patient actually does appear to be making some progress in regard to his lower extremity ulcers at this point. In fact  he really does not have any ulcerations as much as he just does have lymphedema breakdown causing weeping. Nonetheless he is drying up quite a bit but overall seems to be doing much better. He has a lot of dry flaky skin I think we may want to add triamcinolone to the skin to try to help out with cutting back on some of the irritation here hopefully improve the overall skin quality. He does have Velcro compression wraps once he heals that is good news as well. 07/23/2019 upon evaluation today patient appears to be doing better with regard to his bilateral lower extremities. I think we are getting closer to getting everything sealed up. Again he does not have any significant wounds and his lymphedema has dramatically improved. 07/30/2019 upon evaluation today patient appears to be doing excellent in regard to his bilateral lower extremities. Fortunately there is no signs of active infection at this time. Overall the patient seems to be in fact completely sealed up as far as any areas of weeping or concern which also has me thrilled based on what we are seeing currently. Overall I think that he is doing extremely well. 09/03/2019 upon evaluation today patient appears to be doing somewhat poorly in regard to his left lower extremity with regard to open wounds. Fortunately there is nothing that appears to be draining too terribly although he has about 3 different areas that are open as far as wounds are concerned all 3 seem to be fairly superficial. He was discharged from our clinic on 07/30/2019. Subsequently he reopened in the interim apparently at home health never completely discharged home from being seen. They in fact have still  continue to wrap his left leg up until now. 09/10/2019 upon evaluation today patient actually appears to be doing better with regard to his lower extremities. Fortunately there is no signs of active infection at this time. No fevers, chills, nausea, vomiting, or diarrhea. Overall been fairly pleased with how things seem to be progressing at this point. The patient is likewise happy that his legs look better. He tells me he has been elevating more. With that being said he does not really commit to exactly how much more and his wife tells me he still spends a lot of time with his feet on the ground. 5/19; have not seen this patient in quite some time. He had wounds on his left lower extremity. These are all healed today. He has juxta lites for both legs. He has 1 on the right leg it looks as though he has been putting this on well. His wife emphasizes that he lotions his own legs and puts on his own stocking. He also has external compression pumps that he uses twice a day Readmission: 09/15/2020 upon evaluation today patient appears to be doing somewhat poorly in regard to his bilateral lower extremities. He seems to likely have cellulitis. He is having weeping and drainage due to lymphedema but I do not see any evidence of actual systemic infection at this point which is great news. With that being said I do feel like that this is likely continue to be addressed with some oral antibiotics at minimum. If he gets any worse he is probably need to go to the hospital. The patient's ABI when last checked by Dr. Alvester Chou was on the right 0.65 and the left 0.58 but it was felt that he could tolerate compression and subsequently he is always tolerated the 3 layer compression wrap. Again he does have  a history of lymphedema, chronic venous insufficiency, hypertension, coronary artery disease, and callus buildup on his feet. 5/23 the patient cultured predominantly staph aureus last time however a few Pseudomonas. He  has been on Bactrim which would probably not cover the Pseudomonas however that cellulitis in his right leg is completely resolved. HOWEVER he has an open area in the right posterior calf. No open wound on the left he has not been using his compression pumps because he was not sure he had 2 with the infection present Steven Meza, Steven Meza ([id number]) Objective Constitutional Patient is hypertensive.. Pulse regular and within target range for patient.Marland Kitchen Respirations regular, non-labored and within target range.. Temperature is normal and within the target range for the patient.Marland Kitchen appears in no distress. Vitals Time Taken: 1:40 PM, Height: 67 in, Weight: 250 lbs, BMI: 39.2, Temperature: 98 F, Pulse: 80 bpm, Respiratory Rate: 18 breaths/min, Blood Pressure: 172/67 mmHg. General Notes: Wound exam; there is a new open area on the right posterior calf. Although his cellulitis untold is a lot better there is absolutely no tenderness. Major open area now on the right posterior calf weepy erythematous and not completely without surface debris. He has still has obvious lymphedema but no obvious tenderness Integumentary (Hair, Skin) Wound #11 status is Open. Original cause of wound was Gradually Appeared. The date acquired was: 08/04/2020. The wound is located on the Right,Posterior Lower Leg. The wound measures 5cm length x 4.5cm width x 0.1cm depth; 17.671cm^2 area and 1.767cm^3 volume. There is Fat Layer (Subcutaneous Tissue) exposed. There is no tunneling or undermining noted. There is a medium amount of serosanguineous drainage noted. There is large (67-100%) pink granulation within the wound bed. There is no necrotic tissue within the wound bed. Assessment Active Problems ICD-10 Non-pressure chronic ulcer of right calf with other specified severity Lymphedema, not elsewhere classified Venous insufficiency (chronic) (peripheral) Plan Follow-up Appointments: Return Appointment in 1 week. Nurse Visit as  needed Edema Control - Lymphedema / Segmental Compressive Device / Other: Optional: One layer of unna paste to top of compression wrap (to act as an anchor). 3 Layer Compression System for Lymphedema. - BILATERAL 3 LAYER COMPRESSION WRAP Patient to wear own Velcro compression garment. Remove compression stockings every night before going to bed and put on every morning when getting up. - REQUESTS THAT FARROW BE REORDERED AS PRIOR WORN OUT. Gave patient's wife information to call [facility] regarding Lenore Cordia. Elevate, Exercise Daily and Avoid Standing for Long Periods of Time. Elevate legs to the level of the heart and pump ankles as often as possible Compression Pump: Use compression pump on left lower extremity for 60 minutes, twice daily. Compression Pump: Use compression pump on right lower extremity for 60 minutes, twice daily. DO YOUR BEST to sleep in the bed at night. DO NOT sleep in your recliner. Long hours of sitting in a recliner leads to swelling of the legs and/or potential wounds on your backside. Other: - CONTINUE antibiotics as ordered for cellulitis lower legs #1 the patient cellulitis is a lot better although Bactrim probably did not cover the Pseudomonas most of the infection was probably the MRSA. This is a lot better. Keep an eye on this next week 2. He does however have an open area on the right posterior calf we are going to dress with silver alginate 3 back and 3 layer compression and restarting his compression pumps twice a day Electronic Signature(s) Signed: 09/22/2020 3:42:13 PM By: Linton Ham MD Steven Meza ([id number]) Entered  By: Linton Ham on 09/22/2020 14:15:27 Steven Meza ([id number]) -------------------------------------------------------------------------------- SuperBill Details Patient Name: Steven Meza Date of Service: 09/22/2020 Medical Record Number: [id number] Patient Account Number: [id number] Date of Birth/Sex: 09-09-1930 (87  y.o. M) Treating RN: Cornell Barman Primary Care Provider: Fulton Reek Other Clinician: Referring Provider: Fulton Reek Treating Provider/Extender: Tito Dine in Treatment: 1 Diagnosis Coding ICD-10 Codes Meza Description [phone] Non-pressure chronic ulcer of right calf with other specified severity I89.0 Lymphedema, not elsewhere classified I87.2 Venous insufficiency (chronic) (peripheral) Facility Procedures CPT4: Description Modifier Quantity Meza [id number] BILATERAL: Application of multi-layer venous compression system; leg (below knee), including 1 ankle and foot. Physician Procedures CPT4 Meza: [id number] Description: 70110 - WC PHYS LEVEL 3 - EST PT Modifier: Quantity: 1 CPT4 Meza: Description: ICD-10 Diagnosis Description Y34.961 Non-pressure chronic ulcer of right calf with other specified severity I89.0 Lymphedema, not elsewhere classified Modifier: Quantity: Electronic Signature(s) Signed: 09/22/2020 3:42:13 PM By: Linton Ham MD Entered By: Linton Ham on 09/22/2020 14:15:55

## 2020-09-25 ENCOUNTER — Other Ambulatory Visit: Payer: Self-pay

## 2020-09-25 DIAGNOSIS — L97219 Non-pressure chronic ulcer of right calf with unspecified severity: Secondary | ICD-10-CM | POA: Diagnosis not present

## 2020-09-25 NOTE — Progress Notes (Signed)
IOAN, LANDINI ([id number]) Visit Report for 09/25/2020 Arrival Information Details Patient Name: Steven Meza, Steven Meza Date of Service: 09/25/2020 4:00 PM Medical Record Number: [id number] Patient Account Number: [id number] Date of Birth/Sex: May 23, 1930 (87 y.o. M) Treating RN: Dolan Amen Primary Care Shemia Bevel: Fulton Reek Other Clinician: Referring Avishai Reihl: Fulton Reek Treating Sabirin Baray/Extender: Tito Dine in Treatment: 1 Visit Information History Since Last Visit Added or deleted any medications: No Patient Arrived: [name] Had a fall or experienced change in No Arrival Time: 16:23 activities of daily living that may affect Accompanied By: wife risk of falls: Transfer Assistance: None Hospitalized since last visit: No Patient Identification Verified: Yes Pain Present Now: No Secondary Verification Process Completed: Yes Patient Requires Transmission-Based Precautions: No Patient Has Alerts: Yes Patient Alerts: ABI right .65 ABI left .58 Electronic Signature(s) Signed: 09/25/2020 4:37:07 PM By: Jeanine Luz Entered By: Jeanine Luz on 09/25/2020 16:24:14 [name] ([id number]) -------------------------------------------------------------------------------- Clinic Level of Care Assessment Details Patient Name: [name] Date of Service: 09/25/2020 4:00 PM Medical Record Number: [id number] Patient Account Number: [id number] Date of Birth/Sex: 05-28-1930 (87 y.o. M) Treating RN: Dolan Amen Primary Care Amiee Wiley: Fulton Reek Other Clinician: Referring Khayla Koppenhaver: Fulton Reek Treating Perrin Eddleman/Extender: Tito Dine in Treatment: 1 Clinic Level of Care Assessment Items TOOL 1 Quantity Score []  - Use when EandM and Procedure is performed on INITIAL visit 0 ASSESSMENTS - Nursing Assessment / Reassessment []  - General Physical Exam (combine w/ comprehensive assessment (listed just below) when performed on new 0 pt.  evals) []  - 0 Comprehensive Assessment (HX, ROS, Risk Assessments, Wounds Hx, etc.) ASSESSMENTS - Wound and Skin Assessment / Reassessment []  - Dermatologic / Skin Assessment (not related to wound area) 0 ASSESSMENTS - Ostomy and/or Continence Assessment and Care []  - Incontinence Assessment and Management 0 []  - 0 Ostomy Care Assessment and Management (repouching, etc.) PROCESS - Coordination of Care []  - Simple Patient / Family Education for ongoing care 0 []  - 0 Complex (extensive) Patient / Family Education for ongoing care []  - 0 Staff obtains Programmer, systems, Records, Test Results / Process Orders []  - 0 Staff telephones HHA, Nursing Homes / Clarify orders / etc []  - 0 Routine Transfer to another Facility (non-emergent condition) []  - 0 Routine Hospital Admission (non-emergent condition) []  - 0 New Admissions / Biomedical engineer / Ordering NPWT, Apligraf, etc. []  - 0 Emergency Hospital Admission (emergent condition) PROCESS - Special Needs []  - Pediatric / Minor Patient Management 0 []  - 0 Isolation Patient Management []  - 0 Hearing / Language / Visual special needs []  - 0 Assessment of Community assistance (transportation, D/C planning, etc.) []  - 0 Additional assistance / Altered mentation []  - 0 Support Surface(s) Assessment (bed, cushion, seat, etc.) INTERVENTIONS - Miscellaneous []  - External ear exam 0 []  - 0 Patient Transfer (multiple staff / Civil Service fast streamer / Similar devices) []  - 0 Simple Staple / Suture removal (25 or less) []  - 0 Complex Staple / Suture removal (26 or more) []  - 0 Hypo/Hyperglycemic Management (do not check if billed separately) []  - 0 Ankle / Brachial Index (ABI) - do not check if billed separately Has the patient been seen at the hospital within the last three years: Yes Total Score: 0 Level Of Care: ____ [name] ([id number]) Electronic Signature(s) Signed: 09/25/2020 4:37:07 PM By: Jeanine Luz Entered By: Jeanine Luz on 09/25/2020 16:25:29 [name] ([id number]) -------------------------------------------------------------------------------- Compression Therapy Details Patient Name: [name] Date of Service: 09/25/2020 4:00  PM Medical Record Number: [id number] Patient Account Number: [id number] Date of Birth/Sex: 17-Mar-1931 (87 y.o. M) Treating RN: Dolan Amen Primary Care Marolyn Urschel: Fulton Reek Other Clinician: Referring Jeneane Pieczynski: Fulton Reek Treating Travis Mastel/Extender: Tito Dine in Treatment: 1 Compression Therapy Performed for Wound Assessment: Wound #11 Right,Posterior Lower Leg Performed By: Cora Daniels, RN Compression Type: Three Layer Electronic Signature(s) Signed: 09/25/2020 4:37:07 PM By: Jeanine Luz Entered By: Jeanine Luz on 09/25/2020 16:24:43 [name] ([id number]) -------------------------------------------------------------------------------- Encounter Discharge Information Details Patient Name: [name] Date of Service: 09/25/2020 4:00 PM Medical Record Number: [id number] Patient Account Number: [id number] Date of Birth/Sex: 10-28-1930 (87 y.o. M) Treating RN: Dolan Amen Primary Care Taneeka Curtner: Fulton Reek Other Clinician: Referring Sharlene Mccluskey: Fulton Reek Treating Jonay Hitchcock/Extender: Tito Dine in Treatment: 1 Encounter Discharge Information Items Discharge Condition: Stable Ambulatory Status: Walker Discharge Destination: Home Transportation: Private Auto Accompanied By: wife Schedule Follow-up Appointment: Yes Clinical Summary of Care: Electronic Signature(s) Signed: 09/25/2020 4:37:07 PM By: Jeanine Luz Entered By: Jeanine Luz on 09/25/2020 16:25:22 [name] ([id number]) -------------------------------------------------------------------------------- Wound Assessment Details Patient Name: [name] Date of Service: 09/25/2020 4:00 PM Medical  Record Number: [id number] Patient Account Number: [id number] Date of Birth/Sex: 24-Jun-1930 (87 y.o. M) Treating RN: Dolan Amen Primary Care Kayly Kriegel: Fulton Reek Other Clinician: Referring Woodley Petzold: Fulton Reek Treating Urian Martenson/Extender: Tito Dine in Treatment: 1 Wound Status Wound Number: 11 Primary Etiology: Lymphedema Wound Location: Right, Posterior Lower Leg Wound Status: Open Wounding Event: Gradually Appeared Comorbid Lymphedema, Coronary Artery Disease, History: Hypertension Date Acquired: 08/04/2020 Weeks Of Treatment: 0 Clustered Wound: No Wound Measurements Length: (cm) 5 Width: (cm) 4.5 Depth: (cm) 0.1 Area: (cm) 17.671 Volume: (cm) 1.767 % Reduction in Area: 0% % Reduction in Volume: 0% Epithelialization: None Wound Description Classification: Full Thickness Without Exposed Support Structures Exudate Amount: Medium Exudate Type: Serosanguineous Exudate Color: red, brown Foul Odor After Cleansing: No Slough/Fibrino No Wound Bed Granulation Amount: Large (67-100%) Exposed Structure Granulation Quality: Pink Fascia Exposed: No Necrotic Amount: None Present (0%) Fat Layer (Subcutaneous Tissue) Exposed: Yes Tendon Exposed: No Muscle Exposed: No Joint Exposed: No Bone Exposed: No Treatment Notes Wound #11 (Lower Leg) Wound Laterality: Right, Posterior Cleanser Peri-Wound Care Topical Primary Dressing Silvercel 4 1/4x 4 1/4 (in/in) Discharge Instruction: Apply Silvercel 4 1/4x 4 1/4 (in/in) as instructed Secondary Dressing Xtrasorb Medium 4x5 (in/in) Discharge Instruction: Apply to wound as directed. Do not cut. Secured With Compression Wrap Profore Lite LF 3 Multilayer Compression Bandaging System Discharge Instruction: Apply 3 multi-layer wrap as prescribed. Compression Stockings CAMILLO, QUADROS ([id number]) Add-Ons Electronic Signature(s) Signed: 09/25/2020 4:37:07 PM By: Jeanine Luz Signed: 09/25/2020 4:56:26 PM By:  Georges Mouse, Minus Breeding RN Entered By: Jeanine Luz on 09/25/2020 16:24:29

## 2020-09-25 NOTE — Progress Notes (Signed)
Steven Meza, Steven Meza ([id number]) Visit Report for 09/22/2020 Arrival Information Details Patient Name: Steven Meza Date of Service: 09/22/2020 1:30 PM Medical Record Number: [id number] Patient Account Number: [id number] Date of Birth/Sex: [birth year] (87 y.o. M) Treating RN: Carlene Coria Primary Care Kashawna Manzer: Fulton Reek Other Clinician: Referring Mateo Overbeck: Fulton Reek Treating Donnovan Stamour/Extender: Tito Dine in Treatment: 1 Visit Information History Since Last Visit All ordered tests and consults were completed: No Patient Arrived: [name] Added or deleted any medications: No Arrival Time: 13:39 Any new allergies or adverse reactions: No Accompanied By: wife Had a fall or experienced change in No Transfer Assistance: None activities of daily living that may affect Patient Identification Verified: Yes risk of falls: Secondary Verification Process Completed: Yes Signs or symptoms of abuse/neglect since last visito No Patient Requires Transmission-Based Precautions: No Hospitalized since last visit: No Patient Has Alerts: Yes Implantable device outside of the clinic excluding No Patient Alerts: ABI right .65 cellular tissue based products placed in the center ABI left .58 since last visit: Has Dressing in Place as Prescribed: Yes Has Compression in Place as Prescribed: Yes Pain Present Now: No Electronic Signature(s) Signed: 09/22/2020 2:18:56 PM By: Carlene Coria RN Entered By: Carlene Coria on 09/22/2020 13:39:58 Steven Meza ([id number]) -------------------------------------------------------------------------------- Clinic Level of Care Assessment Details Patient Name: Steven Meza Date of Service: 09/22/2020 1:30 PM Medical Record Number: [id number] Patient Account Number: [id number] Date of Birth/Sex: Apr 06, 1931 (87 y.o. M) Treating RN: Cornell Barman Primary Care Kareem Aul: Fulton Reek Other Clinician: Referring Marry Kusch: Fulton Reek Treating Derenda Giddings/Extender: Tito Dine in Treatment: 1 Clinic Level of Care Assessment Items TOOL 1 Quantity Score []  - Use when EandM and Procedure is performed on INITIAL visit 0 ASSESSMENTS - Nursing Assessment / Reassessment []  - General Physical Exam (combine w/ comprehensive assessment (listed just below) when performed on new 0 pt. evals) []  - 0 Comprehensive Assessment (HX, ROS, Risk Assessments, Wounds Hx, etc.) ASSESSMENTS - Wound and Skin Assessment / Reassessment []  - Dermatologic / Skin Assessment (not related to wound area) 0 ASSESSMENTS - Ostomy and/or Continence Assessment and Care []  - Incontinence Assessment and Management 0 []  - 0 Ostomy Care Assessment and Management (repouching, etc.) PROCESS - Coordination of Care []  - Simple Patient / Family Education for ongoing care 0 []  - 0 Complex (extensive) Patient / Family Education for ongoing care []  - 0 Staff obtains Programmer, systems, Records, Test Results / Process Orders []  - 0 Staff telephones HHA, Nursing Homes / Clarify orders / etc []  - 0 Routine Transfer to another Facility (non-emergent condition) []  - 0 Routine Hospital Admission (non-emergent condition) []  - 0 New Admissions / Biomedical engineer / Ordering NPWT, Apligraf, etc. []  - 0 Emergency Hospital Admission (emergent condition) PROCESS - Special Needs []  - Pediatric / Minor Patient Management 0 []  - 0 Isolation Patient Management []  - 0 Hearing / Language / Visual special needs []  - 0 Assessment of Community assistance (transportation, D/C planning, etc.) []  - 0 Additional assistance / Altered mentation []  - 0 Support Surface(s) Assessment (bed, cushion, seat, etc.) INTERVENTIONS - Miscellaneous []  - External ear exam 0 []  - 0 Patient Transfer (multiple staff / Civil Service fast streamer / Similar devices) []  - 0 Simple Staple / Suture removal (25 or less) []  - 0 Complex Staple / Suture removal (26 or more) []  -  0 Hypo/Hyperglycemic Management (do not check if billed separately) []  - 0 Ankle / Brachial Index (ABI) - do not check if billed separately Has  the patient been seen at the hospital within the last three years: Yes Total Score: 0 Level Of Care: ____ Steven Meza ([id number]) Electronic Signature(s) Signed: 09/23/2020 5:46:47 PM By: Gretta Cool, BSN, RN, CWS, Kim RN, BSN Entered By: Gretta Cool, BSN, RN, CWS, Kim on 09/22/2020 14:03:47 Steven Meza ([id number]) -------------------------------------------------------------------------------- Encounter Discharge Information Details Patient Name: Steven Meza Date of Service: 09/22/2020 1:30 PM Medical Record Number: [id number] Patient Account Number: [id number] Date of Birth/Sex: [birth year] (87 y.o. M) Treating RN: Primary Care Harmani Neto: Fulton Reek Other Clinician: Referring Tulio Facundo: Fulton Reek Treating Dorian Duval/Extender: Tito Dine in Treatment: 1 Encounter Discharge Information Items Discharge Condition: Stable Ambulatory Status: Walker Discharge Destination: Home Transportation: Private Auto Accompanied By: wife Schedule Follow-up Appointment: Yes Clinical Summary of Care: Electronic Signature(s) Signed: 09/25/2020 4:37:07 PM By: Jeanine Luz Entered By: Jeanine Luz on 09/22/2020 14:27:50 Steven Meza ([id number]) -------------------------------------------------------------------------------- Lower Extremity Assessment Details Patient Name: Steven Meza Date of Service: 09/22/2020 1:30 PM Medical Record Number: [id number] Patient Account Number: [id number] Date of Birth/Sex: [birth year] (87 y.o. M) Treating RN: Carlene Coria Primary Care Carriann Hesse: Fulton Reek Other Clinician: Referring Leora Platt: Fulton Reek Treating Katya Rolston/Extender: Tito Dine in Treatment: 1 Edema Assessment Assessed: [Left: No] [Right: No] [Left: Edema] [Right: :] Calf Left: Right: Point of  Measurement: 35 cm From Medial Instep 44 cm 41 cm Ankle Left: Right: Point of Measurement: 10 cm From Medial Instep 28 cm 28 cm Electronic Signature(s) Signed: 09/22/2020 2:18:56 PM By: Carlene Coria RN Entered By: Carlene Coria on 09/22/2020 13:51:58 Steven Meza ([id number]) -------------------------------------------------------------------------------- Multi Wound Chart Details Patient Name: Steven Meza Date of Service: 09/22/2020 1:30 PM Medical Record Number: [id number] Patient Account Number: [id number] Date of Birth/Sex: 08-15-1930 (87 y.o. M) Treating RN: Cornell Barman Primary Care Shayne Deerman: Fulton Reek Other Clinician: Referring Norrine Ballester: Fulton Reek Treating Lynett Brasil/Extender: Tito Dine in Treatment: 1 Vital Signs Height(in): 67 Pulse(bpm): 80 Weight(lbs): 250 Blood Pressure(mmHg): 172/67 Body Mass Index(BMI): 39 Temperature(F): 98 Respiratory Rate(breaths/min): 18 Wound Assessments Treatment Notes Electronic Signature(s) Signed: 09/23/2020 5:46:47 PM By: Gretta Cool, BSN, RN, CWS, Kim RN, BSN Entered By: Gretta Cool, BSN, RN, CWS, Kim on 09/22/2020 13:59:25 Steven Meza ([id number]) -------------------------------------------------------------------------------- Multi-Disciplinary Care Plan Details Patient Name: Steven Meza Date of Service: 09/22/2020 1:30 PM Medical Record Number: [id number] Patient Account Number: [id number] Date of Birth/Sex: 1930/06/20 (87 y.o. M) Treating RN: Cornell Barman Primary Care Burnetta Kohls: Fulton Reek Other Clinician: Referring Selenia Mihok: Fulton Reek Treating Adrine Hayworth/Extender: Tito Dine in Treatment: 1 Active Inactive Wound/Skin Impairment Nursing Diagnoses: Impaired tissue integrity Goals: Ulcer/skin breakdown will have a volume reduction of 30% by week 4 Date Initiated: 09/15/2020 Target Resolution Date: 10/13/2020 Goal Status: Active Ulcer/skin breakdown will have a volume reduction of  50% by week 8 Date Initiated: 09/15/2020 Target Resolution Date: 11/10/2020 Goal Status: Active Interventions: Assess patient/caregiver ability to obtain necessary supplies Assess patient/caregiver ability to perform ulcer/skin care regimen upon admission and as needed Notes: Electronic Signature(s) Signed: 09/23/2020 5:46:47 PM By: Gretta Cool, BSN, RN, CWS, Kim RN, BSN Entered By: Gretta Cool, BSN, RN, CWS, Kim on 09/22/2020 13:59:04 Steven Meza ([id number]) -------------------------------------------------------------------------------- Non-Wound Condition Assessment Details Patient Name: Steven Meza Date of Service: 09/22/2020 1:30 PM Medical Record Number: [id number] Patient Account Number: [id number] Date of Birth/Gender: 1930-10-01 (87 y.o. M) Treating RN: Carlene Coria Primary Care Physician: Fulton Reek Other Clinician: Referring Physician: Fulton Reek Treating Physician/Extender: Tito Dine in Treatment: 1 Non-Wound Condition: Condition: Lymphedema Location: Leg Side: Left  Electronic Signature(s) Signed: 09/22/2020 2:18:56 PM By: Carlene Coria RN Entered By: Carlene Coria on 09/22/2020 13:50:57 Steven Meza ([id number]) -------------------------------------------------------------------------------- Non-Wound Condition Assessment Details Patient Name: Steven Meza Date of Service: 09/22/2020 1:30 PM Medical Record Number: [id number] Patient Account Number: [id number] Date of Birth/Gender: 1931-02-01 (87 y.o. M) Treating RN: Carlene Coria Primary Care Physician: Fulton Reek Other Clinician: Referring Physician: Fulton Reek Treating Physician/Extender: Ricard Dillon Weeks in Treatment: 1 Non-Wound Condition: Condition: Lymphedema Location: Leg Side: Right Electronic Signature(s) Signed: 09/22/2020 2:18:56 PM By: Carlene Coria RN Entered By: Carlene Coria on 09/22/2020 13:50:57 Steven Meza  ([id number]) -------------------------------------------------------------------------------- Pain Assessment Details Patient Name: Steven Meza Date of Service: 09/22/2020 1:30 PM Medical Record Number: [id number] Patient Account Number: [id number] Date of Birth/Sex: 02-19-1931 (87 y.o. M) Treating RN: Carlene Coria Primary Care Leeon Makar: Fulton Reek Other Clinician: Referring Amitai Delaughter: Fulton Reek Treating Elazar Argabright/Extender: Tito Dine in Treatment: 1 Active Problems Location of Pain Severity and Description of Pain Patient Has Paino No Site Locations Pain Management and Medication Current Pain Management: Electronic Signature(s) Signed: 09/22/2020 2:18:56 PM By: Carlene Coria RN Entered By: Carlene Coria on 09/22/2020 13:40:43 Steven Meza ([id number]) -------------------------------------------------------------------------------- Patient/Caregiver Education Details Patient Name: Steven Meza Date of Service: 09/22/2020 1:30 PM Medical Record Number: [id number] Patient Account Number: [id number] Date of Birth/Gender: August 20, 1930 (87 y.o. M) Treating RN: Cornell Barman Primary Care Physician: Fulton Reek Other Clinician: Referring Physician: Fulton Reek Treating Physician/Extender: Tito Dine in Treatment: 1 Education Assessment Education Provided To: Patient Education Topics Provided Venous: Handouts: Controlling Swelling with Multilayered Compression Wraps Methods: Demonstration, Explain/Verbal Responses: State content correctly Wound/Skin Impairment: Handouts: Caring for Your Ulcer Methods: Demonstration, Explain/Verbal Responses: State content correctly Electronic Signature(s) Signed: 09/23/2020 5:46:47 PM By: Gretta Cool, BSN, RN, CWS, Kim RN, BSN Entered By: Gretta Cool, BSN, RN, CWS, Kim on 09/22/2020 14:04:22 Steven Meza ([id number]) -------------------------------------------------------------------------------- Wound  Assessment Details Patient Name: Steven Meza Date of Service: 09/22/2020 1:30 PM Medical Record Number: [id number] Patient Account Number: [id number] Date of Birth/Sex: 06/01/1930 (87 y.o. M) Treating RN: Primary Care Melisse Caetano: Fulton Reek Other Clinician: Referring Muhammed Teutsch: Fulton Reek Treating Ashe Gago/Extender: Tito Dine in Treatment: 1 Wound Status Wound Number: [id number] Primary Etiology: Lymphedema Wound Location: Right, Posterior Lower Leg Wound Status: Open Wounding Event: Gradually Appeared Comorbid Lymphedema, Coronary Artery Disease, History: Hypertension Date Acquired: 08/04/2020 Weeks Of Treatment: 0 Clustered Wound: No Photos Wound Measurements Length: (cm) 5 Width: (cm) 4.5 Depth: (cm) 0.1 Area: (cm) 17.671 Volume: (cm) 1.767 % Reduction in Area: 0% % Reduction in Volume: 0% Epithelialization: None Tunneling: No Undermining: No Wound Description Classification: Full Thickness Without Exposed Support Structures Exudate Amount: Medium Exudate Type: Serosanguineous Exudate Color: red, brown Foul Odor After Cleansing: No Slough/Fibrino No Wound Bed Granulation Amount: Large (67-100%) Exposed Structure Granulation Quality: Pink Fascia Exposed: No Necrotic Amount: None Present (0%) Fat Layer (Subcutaneous Tissue) Exposed: Yes Tendon Exposed: No Muscle Exposed: No Joint Exposed: No Bone Exposed: No Electronic Signature(s) Signed: 09/23/2020 5:46:47 PM By: Gretta Cool, BSN, RN, CWS, Kim RN, BSN Entered By: Gretta Cool, BSN, RN, CWS, Kim on 09/22/2020 14:29:30 Steven Meza ([id number]) -------------------------------------------------------------------------------- Vitals Details Patient Name: Steven Meza Date of Service: 09/22/2020 1:30 PM Medical Record Number: [id number] Patient Account Number: [id number] Date of Birth/Sex: Jan 26, 1931 (87 y.o. M) Treating RN: Carlene Coria Primary Care Greysen Devino: Fulton Reek Other Clinician: Referring  Horice Carrero: Fulton Reek Treating Hakeen Shipes/Extender: Tito Dine in Treatment: 1 Vital Signs Time Taken: 13:40 Temperature (  F): 98 Height (in): 67 Pulse (bpm): 80 Weight (lbs): 250 Respiratory Rate (breaths/min): 18 Body Mass Index (BMI): 39.2 Blood Pressure (mmHg): 172/67 Reference Range: 80 - 120 mg / dl Electronic Signature(s) Signed: 09/22/2020 2:18:56 PM By: Carlene Coria RN Entered By: Carlene Coria on 09/22/2020 13:40:37

## 2020-10-01 ENCOUNTER — Other Ambulatory Visit: Payer: Self-pay

## 2020-10-01 ENCOUNTER — Encounter: Payer: Medicare Other | Attending: Internal Medicine | Admitting: Internal Medicine

## 2020-10-01 DIAGNOSIS — I872 Venous insufficiency (chronic) (peripheral): Secondary | ICD-10-CM | POA: Diagnosis not present

## 2020-10-01 DIAGNOSIS — Z8551 Personal history of malignant neoplasm of bladder: Secondary | ICD-10-CM | POA: Insufficient documentation

## 2020-10-01 DIAGNOSIS — I890 Lymphedema, not elsewhere classified: Secondary | ICD-10-CM | POA: Insufficient documentation

## 2020-10-01 DIAGNOSIS — L97218 Non-pressure chronic ulcer of right calf with other specified severity: Secondary | ICD-10-CM | POA: Insufficient documentation

## 2020-10-01 NOTE — Progress Notes (Signed)
EMMANUAL, GAUTHREAUX ([id number]) Visit Report for 10/01/2020 HPI Details Patient Name: Steven Meza, Steven Meza Date of Service: 10/01/2020 11:15 AM Medical Record Number: [id number] Patient Account Number: [id number] Date of Birth/Sex: 1931-03-23 (87 y.o. M) Treating RN: Cornell Barman Primary Care Provider: Fulton Reek Other Clinician: Referring Provider: Fulton Reek Treating Provider/Extender: Tito Dine in Treatment: 2 History of Present Illness HPI Description: ADMISSION 05/17/2018 Mr. Madan is an 87 year old man who is been treated at the lymphedema clinic for a long period with lymphedema wraps for bilateral lower extremity lymphedema. About 6 months ago they noted small open areas on his left lateral calf just above the ankle. These were open. They are apparently putting some form of ointment on this. They have been referred here for our review of this. The patient has a long history of lymphedema and venous stasis. It says in his records that he has recurrent cellulitis although his wife denies this but she states that the lymphedema may have started with cellulitis several years ago. Also in his record there is a history of bladder cancer which they seem to know very little about however he did not have any radiation to the pelvis or anything that could have contributed to lymphedema that they are aware of. There is no prior wound history. The patient has 2 small punched out areas on the left lateral calf that have adherent debris on the surface. Past medical history; lymphedema, hypertension, cellulitis, hearing loss, morbid obesity, osteoarthritis, venous stasis, bladder CA, diverticulosis. ABIs in our clinic were 0.36 on the right and 0.57 on the left 05/24/2018; corrected age on this patient is 73 versus what I said last week. He has been for his arterial studies which predictably are not very good. On the right his ABI is 0.65. Monophasic waveforms noted at the right ankle  including the posterior tibial and dorsalis pedis. He has triphasic waveforms of the common femoral and profunda femoris triphasic proximal SFA with monophasic distal FSA and popliteal artery. Monophasic tibial waveforms. On the left his ABI is 0.58 monophasic waveforms at the left ankle. Duplex of the left lower extremity demonstrated atherosclerotic change with monophasic waveforms throughout the left lower extremity. There was some concern about left iliac disease and potentially femoral-popliteal and tibial disease. The patient does not describe claudication although according to his wife he over emphasizes his activity. Patient states he is limited by pain in both knees His wounds are on the left lateral calf. 2 small areas with necrotic debris. We have been using silver collagen and light Ace wraps 05/31/2018; wounds on the left lateral calf in the setting of very significant lymphedema and probably PAD. We have been using silver collagen. The base of the small wound looks reasonably improved. I sent him to see Dr. Fletcher Anon however I see now he has an appointment with Dr. Alvester Chou on February 12 2/5; left lateral calf wound looks the same. His wife is doing a good job with her lymphedema wraps and maintaining that the edema. He most likely has PAD and is due to see Dr. Gwenlyn Found of infectious disease on February 12 2/19; left lateral calf wounds look about the same. This looks like wound secondary to chronic venous insufficiency with lymphedema however the patient has very poor arterial studies. We referred him to Dr. Gwenlyn Found. Dr. Gwenlyn Found did not feel he needed to do anything from an arterial point of view. He did not address the question about the aggressiveness of compression. After some thoughts about  this I elected to go ahead and put him in 3 layer compression which after all would be less compression then the lymphedema clinic was putting on this. I am hopeful that this should be enough to get some  closure of the small wounds 2/26; left lateral calf wound letter this week. He tolerated the 3 layer compression well furthermore he is sleeping in a hospital bed which helps keep his legs up at night. The wound looks better measuring smaller especially in width 3/3; left lateral calf wound about the same size. The 3 layer compression has really helped get the edema down in his leg however. Using silver collagen 3/11; left lateral calf wound better looking wound surface but about the same size. We have been using 3 layer compression which is really helped get the edema down in the left leg. Even after Dr. Kennon Holter assessment I am reluctant to go to 4 layer compression. He is tolerating 3 layer compression well. We have been using silver collagen 3/18-Patient returns for his left lateral calf wound which overall is looking better, slightly increased in size and dimensions and area, he is tolerating the 3 layer compression, has been dressed with silver collagen which we will continue 3/25oleft lateral calf wound much better looking. Size is smaller. He has been using silver collagen 4/1; gradually getting smaller in size. Using silver collagen with 3 layer compression. I think the patient has some degree of PAD. He saw Dr. Gwenlyn Found in consultation, he did not specifically comment on whether he could tolerate 4 layer compression 4/8; the wound is slightly smaller in depth. We are using 3 layer compression with silver collagen. I had him seen for arterial insufficiency however his ABI was 0.57 in the left leg. Dr. Gwenlyn Found really did not seem to middle on the degree of compression he could tolerate 4/15; the wound is slightly smaller and slightly less deep. We have been using 3 layer compression with silver collagen. There is not an option for 4-layer compression here. 4/22; the patient is making decent progress on his wound on the left lateral calf however he arrives in with a superficial wound on the  right anterior tibial area. Were not really sure how this happened. He had been using a stocking on the right leg. He has been using silver collagen on the wounds Amazing to me he is he appears to have compression pumps at home. I am not really sure I knew this before. In any case he has not been using them 4/29; left lateral calf wound continues to get gradually smaller. This looks like it is on its way to closure. oThe area on the right anterior tibial area is about the same in terms of size superficial without any depth. This was probably wrap injury oHe tells me that he has been using his compression pumps once a day for 1 hour without any pain 5/6; not as good today. Left lateral calf wound is actually larger and he does not have quite as good edema control. The area on the right anterior JAKEEL, STARLIPER A. ([id number]) tibia is about the same still superficial. He states he is using his compression pumps once a day. We are using 3 layer compression on the left and Curlex Coban on the right 5/13; the area on the right is very close to being healed. The area on the left still has some depth but has a healthy wound surface. He has excellent edema control bilaterally. Using Hydrofera Blue 5/20-Patient returns  in 1 week after being in 3 layer compression on the left, the left lateral calf ulcer is stable with some slough, the right anterior tibial ulcer is healed we have been using Kerlix Coban on that leg 5/27; Hydrofera Blue and 3 layer compression. Still some slough in the wound area and some nonviable edges around the wound. 6/3; this patient has lymphedema and chronic venous insufficiency. He had difficult to close wound on his left lateral calf. This is closed today. 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 Finally he has known PAD but he is managed to tolerate the compression we reporting on him and also using his compression pumps that he already had  at home twice a day 6/10; we discharge this patient last week having finally close the small area in the left lateral calf. Apparently by Friday of last week this had opened and was draining. He is therefore back in clinic. His wife who is present also was concerned about an area on the right anterior tibia."o 6/17; the patient does not have an open area on the right leg. On the left he has a small circular area anteriorly and the same area laterally that he had last time. However both of these appear to be improved he has his Farrow wrap 30/40. It turns out that he is only using his external compression pumps once a day. I tried to get him up to twice a day today 6/24; the patient comes in with his external compression garment on the right leg/Farrow wrap. As far as we know no open wounds here. He has 2 areas that are smaller one on the left anterior pretibial and one on the left lateral calf which is the site of his initial wounds. I have successfully caught him into his external compression pumps twice a day 7/1; still using his external compression garment on the right leg. As far as we know there are no open wounds here. The area on the left lateral calf which was the site of his initial wound has closed over. The area on the left anterior pretibial that we identified last week is still open. 7/8; as far as we know he has no open wounds on the right leg. He has his external compression garment on here. The original wound on the left lateral calf has closed over. He developed an area on the left anterior pretibial 2 weeks ago and seems to have 2 smaller areas on the proximal and distal lateral calf. We do not have his good edema control this week as I am used to seeing 7/15; the patient has still a linear area on the left anterior tibia. The area on the left lateral leg looked somewhat inflamed today but no cellulitis. He did have what looked to be a small pustule that I cultured for CandS  although I did not give him empiric antibiotics. 7/22; small area on the left anterior tibia. The area on the left lateral leg still looks inflamed. The pustule that I cultured last week grew Enterobacter and Pseudomonas. I prescribed Cipro they still have not picked that up. In any case the area actually looks fairly satisfactory. He has decent edema control using his compression pumps twice a day 7/29; the area on the left lateral leg looks a lot better than last week. Nothing is open here. He has an area just medial to the mid anterior tibia that is still open that is only open wound  today. 8/5-Patient returns at 1 week for the left leg ulcer, which is actually looking better, he is also complaining of left plantar foot pain below the great toe. He does have a hematoma here 8/12-Patient returns at 1 week, the left leg open area looks about the same if not slightly better, the left plantar hematoma under the great toe is about the same, patient apparently has not been resting his foot on anything for too long period of time and does not wear the [employer]kenstock shoes unless he walks outdoors which he does not do often 8/19; small wound on the left anterior lower leg this is just about closed there is nothing on the left lateral leg. He is using his compression pumps He arrives in clinic today with a black eschar over the right first plantar metatarsal head. This was apparently a bruise that was identified by her nurses last week. He wears crocs on his feet. He also may rub these on a footboard at home 8/26; there is nothing open on the left lateral lower leg he has a small area just medial to the tibia. He is going to see his podiatrist Dr. Milinda Pointer about the area on the left plantar first metatarsal head. He is using his external compression pumps. He has a juxta lite on the right leg 9/2; there is nothing open on either side of her left lower leg. He went to see podiatry about the area on the left first  metatarsal head. This was apparently debrided. He was given mupirocin and oral antibiotics. I do not believe he is offloading this at all. Readmission: 04/02/2019 upon evaluation today patient actually appears for reevaluation here in our clinic secondary to issues that he is having with his left lower extremity. He has been tolerating the dressing changes without complication. Fortunately there is no signs of active infection at this time. No fevers, chills, nausea, vomiting, or diarrhea. He has been in the hospital since we last saw him and then subsequently transferred back to [facility]. He tells me that they have been changing his dressing once a day and that he has had a significant amount of pain secondary to undergoing these dressing changes. Fortunately there is no evidence of active infection at this point which is good news. No fevers, chills, nausea, vomiting, or diarrhea. With that being said he wonders if we can go back to doing what we were doing before which was when he actually had the dressing changes performed once a week here in the clinic he tells me that he did very well with that. At that point he tolerated a 3 layer compression wrap which was approved by vascular, Dr. Tyrell Antonio office, to be used previous although 4-layer compression wrap is probably too much for him. 04/16/2019 on evaluation today patient appears to be doing decently well with regard to his lower extremity although he has had a lot of drainage from his leg. A lot of this I think is just secondary to the lymphedema with that being said he does have some odor as well which may again just be due to more of the excessive moisture although I think that he may also have something that is a result of bacteria again he has somewhat of a Pseudomonas-like smell to the drainage and fortunately though he is not showing any signs of severe cellulitis he is still experiencing quite a bit of drainage and the  discoloration is somewhat of a yellow/green in color which is consistent  with Pseudomonas. If he can tolerated I think he may do well with Cipro. 04/24/2019 on evaluation today patient appears to be doing well with regard to his wound in the gluteal region which is healing quite well. That is excellent news. With that being said he is also doing better with regard to his left lower extremity overall I feel like things are definitely showing signs of improvement. 12/28-Patient returns with regards to his gluteal wound that is improving, Left lower leg wounds with significant edema in the lower legs that appear to be persistent. Patient is in compression and has lymphedema pumps at home but apparently he does not use consistently 05/07/2019 Upon inspection patient's wound bed actually showed signs on evaluation today patient's wound currently showed signs of improvement. There is still a lot of maceration but it does look like that there is been some of this maceration occurring as a result of drainage in particular and some as a result of the fact that they have been putting bag balm on the wound area as well as the leg in general. I think this is applying too much moisture and not doing him any good. I discussed with the patient and his wife today that I think that practice needs to be adjusted I would recommend using a different type of lotion in order to help with moisture around the wound but I would not recommend anything directly on the wound itself. 05/14/2019 on evaluation today patient actually appears to be doing much better in regard to his lower extremity. They have not been using any of the bag balm on the area I think this has made a tremendous improvement overall in his wounds. He has a lot of new skin growth and though ALUCARD, FEARNOW ([id number]) there still are a lot of open wounds scattered throughout the circumferential lower leg this overall seems to be doing much better to  me. 05/21/2019 upon evaluation today patient appears to be doing well in regard to his lower extremity although he is having unfortunately some blue- green drainage noted at this point. The does have me concerned for a reinitiation of the Pseudomonas infection that we previously treated in December. The last time I gave him a prescription was April 16, 2019. That was a little over a month ago. Nonetheless he does have more green drainage noted today and I think this is indicative of potentially a revving up infection. Obviously we do not want to get to that point. 05/28/2019 on evaluation today patient appears to be doing better with regard to his lower extremity at this point. Overall I am seeing signs of improvement which is good news. Fortunately there is no evidence of active infection at this time. I do believe that the Cipro has been beneficial for him that was prescribed last week for 14 days he is halfway through that and already appears to be doing much better. This is excellent news. 06/04/2019 upon evaluation today I do believe that the patient is doing better with regard to his lower extremity. I feel like that he has much less in the way of open wounds and drainage and much in provement in regard to the overall weeping and even his swelling. There is no signs of erythema no obvious evidence of active infection all of which is great news. No fevers, chills, nausea, vomiting, or diarrhea. 06/11/2019 on evaluation today patient appears to be doing better with regard to his lower extremity on the left. He has been  tolerating dressing changes without complication. Fortunately there is no signs of active infection at this time. No fevers, chills, nausea, vomiting, or diarrhea. 06/18/2019 upon evaluation today patient appears to be doing slightly worse compared to last week. They did call me on Friday due to the fact that he had green discharge and both his wife as well as his home health nurse were  concerned about what they were seeing. Subsequently I did actually send in a prescription for Cipro for 14 days for him and according to his wife this seems to be doing better based on what she is seeing today compared to Friday. This is great news. Home health is still coming out to see him on Wednesdays and Fridays as well. 06/25/2019 upon evaluation today patient appears to be doing somewhat better in regard to his leg ulcer. Fortunately there is no signs of active infection at this time which is good news. With that being said I am very pleased with how things seem to be going and overall I see no major issues here I think the alginate is doing well along with the XtraSorb. With regard to the patient's plantar foot he does have somewhat of a blood blister that appears to be resolving at this point his wife states that she feels like the issue is that he is not dropping his shoe in order to keep it from sliding when he walks. Subsequently this is leading to increases in friction which of course is leading to more issues with getting this area to heal the patient is struggling in that regard. 07/02/2019 upon evaluation today patient appears to be making some progress in regard to his lower extremities. With that being said I really feel like the alginate may be getting too moist and then sitting on the skin causing some complications here for [place]. I discussed with the patient and his wife today as well the possibility of going ahead and discontinuing the alginate just utilizing the Xtrasorb to try to keep the edema under better control while at the same time preventing any dressings from staying too moist right on the surface of the wounds. Obviously I think this can do a good job in helping to dry things out but I would recommend that we give this a shot. 07/09/2019 upon evaluation today patient appears to be doing better in regard to some of the edema and weeping in regard to the left lower extremity.  Unfortunately he has not been wearing his compression of the right lower extremity and now that is open as well. He needs to obviously be wearing this although for today we will get a half to wrap him due to the fact that this is now open. I explained that he does need to be wearing his compression at all times. 07/16/2019 upon evaluation today patient actually does appear to be making some progress in regard to his lower extremity ulcers at this point. In fact he really does not have any ulcerations as much as he just does have lymphedema breakdown causing weeping. Nonetheless he is drying up quite a bit but overall seems to be doing much better. He has a lot of dry flaky skin I think we may want to add triamcinolone to the skin to try to help out with cutting back on some of the irritation here hopefully improve the overall skin quality. He does have Velcro compression wraps once he heals that is good news as well. 07/23/2019 upon evaluation today patient appears  to be doing better with regard to his bilateral lower extremities. I think we are getting closer to getting everything sealed up. Again he does not have any significant wounds and his lymphedema has dramatically improved. 07/30/2019 upon evaluation today patient appears to be doing excellent in regard to his bilateral lower extremities. Fortunately there is no signs of active infection at this time. Overall the patient seems to be in fact completely sealed up as far as any areas of weeping or concern which also has me thrilled based on what we are seeing currently. Overall I think that he is doing extremely well. 09/03/2019 upon evaluation today patient appears to be doing somewhat poorly in regard to his left lower extremity with regard to open wounds. Fortunately there is nothing that appears to be draining too terribly although he has about 3 different areas that are open as far as wounds are concerned all 3 seem to be fairly superficial. He  was discharged from our clinic on 07/30/2019. Subsequently he reopened in the interim apparently at home health never completely discharged home from being seen. They in fact have still continue to wrap his left leg up until now. 09/10/2019 upon evaluation today patient actually appears to be doing better with regard to his lower extremities. Fortunately there is no signs of active infection at this time. No fevers, chills, nausea, vomiting, or diarrhea. Overall been fairly pleased with how things seem to be progressing at this point. The patient is likewise happy that his legs look better. He tells me he has been elevating more. With that being said he does not really commit to exactly how much more and his wife tells me he still spends a lot of time with his feet on the ground. 5/19; have not seen this patient in quite some time. He had wounds on his left lower extremity. These are all healed today. He has juxta lites for both legs. He has 1 on the right leg it looks as though he has been putting this on well. His wife emphasizes that he lotions his own legs and puts on his own stocking. He also has external compression pumps that he uses twice a day Readmission: 09/15/2020 upon evaluation today patient appears to be doing somewhat poorly in regard to his bilateral lower extremities. He seems to likely have cellulitis. He is having weeping and drainage due to lymphedema but I do not see any evidence of actual systemic infection at this point which is great news. With that being said I do feel like that this is likely continue to be addressed with some oral antibiotics at minimum. If he gets any worse he is probably need to go to the hospital. The patient's ABI when last checked by Dr. Alvester Chou was on the right 0.65 and the left 0.58 but it was felt that he could tolerate compression and subsequently he is always tolerated the 3 layer compression wrap. Again he does have a history of lymphedema, chronic  venous insufficiency, hypertension, coronary artery disease, and callus buildup on his feet. 5/23 the patient cultured predominantly staph aureus last time however a few Pseudomonas. He has been on Bactrim which would probably not cover the Pseudomonas however that cellulitis in his right leg is completely resolved. HOWEVER he has an open area in the right posterior calf. No open wound on the left he has not been using his compression pumps because he was not sure he had 2 with the infection present 6/1; everything is closed  on both legs including area right posterior. He has a lot of desquamating surface epithelium on the posterior part of the EBEN, CHOINSKI A. ([id number]) calf I think in the setting of chronic stasis dermatitis. Everything is also closed on the left. They have Farrow wrap external stockings although they did not bring these in today. Electronic Signature(s) Signed: 10/01/2020 5:08:05 PM By: Linton Ham MD Entered By: Linton Ham on 10/01/2020 12:20:12 [name] ([id number]) -------------------------------------------------------------------------------- Physical Exam Details Patient Name: [name] Date of Service: 10/01/2020 11:15 AM Medical Record Number: [id number] Patient Account Number: [id number] Date of Birth/Sex: 1931-03-25 (87 y.o. M) Treating RN: Cornell Barman Primary Care Provider: Fulton Reek Other Clinician: Referring Provider: Fulton Reek Treating Provider/Extender: Tito Dine in Treatment: 2 Constitutional Sitting or standing Blood Pressure is within target range for patient.. Pulse regular and within target range for patient.Marland Kitchen Respirations regular, non- labored and within target range.. Temperature is normal and within the target range for the patient.Marland Kitchen appears in no distress. Notes Wound exam; no open area on the right posterior calf. He still has erythema but no palpable tenderness no weeping. His edema is under  much better control. Dry flaking skin predominantly on the right lower leg Electronic Signature(s) Signed: 10/01/2020 5:08:05 PM By: Linton Ham MD Entered By: Linton Ham on 10/01/2020 12:21:05 [name] ([id number]) -------------------------------------------------------------------------------- Physician Orders Details Patient Name: [name] Date of Service: 10/01/2020 11:15 AM Medical Record Number: [id number] Patient Account Number: [id number] Date of Birth/Sex: July 18, 1930 (87 y.o. M) Treating RN: Dolan Amen Primary Care Provider: Fulton Reek Other Clinician: Referring Provider: Fulton Reek Treating Provider/Extender: Tito Dine in Treatment: 2 Verbal / Phone Orders: No Diagnosis Coding Follow-up Appointments o Return Appointment in 1 week. o Nurse Visit as needed Edema Control - Lymphedema / Segmental Compressive Device / Other Bilateral Lower Extremities o Optional: One layer of unna paste to top of compression wrap (to act as an anchor). o 3 Layer Compression System for Lymphedema. - BILATERAL 3 LAYER COMPRESSION WRAP-TCA and lotion o Patient to wear own Velcro compression garment. Remove compression stockings every night before going to bed and put on every morning when getting up. - REQUESTS THAT FARROW BE REORDERED AS PRIOR WORN OUT. Gave patient's wife information to call [facility] regarding Lenore Cordia. o Elevate, Exercise Daily and Avoid Standing for Long Periods of Time. o Elevate legs to the level of the heart and pump ankles as often as possible o Compression Pump: Use compression pump on left lower extremity for 60 minutes, twice daily. o Compression Pump: Use compression pump on right lower extremity for 60 minutes, twice daily. o DO YOUR BEST to sleep in the bed at night. DO NOT sleep in your recliner. Long hours of sitting in a recliner leads to swelling of the legs and/or potential wounds on your  backside. o Other: - CONTINUE antibiotics as ordered for cellulitis lower legs Electronic Signature(s) Signed: 10/01/2020 4:52:16 PM By: Georges Mouse, Minus Breeding RN Signed: 10/01/2020 5:08:05 PM By: Linton Ham MD Entered By: Georges Mouse, Minus Breeding on 10/01/2020 12:05:41 [name] ([id number]) -------------------------------------------------------------------------------- Problem List Details Patient Name: [name] Date of Service: 10/01/2020 11:15 AM Medical Record Number: [id number] Patient Account Number: [id number] Date of Birth/Sex: [birth year] (87 y.o. 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 with other specified severity 09/22/2020 No Yes I89.0 Lymphedema, not elsewhere classified 09/15/2020 No Yes I87.2 Venous insufficiency (chronic) (peripheral) 09/15/2020 No Yes Inactive Problems ICD-10 Code Description Active Date Inactive Date I10 Essential (primary) hypertension 09/15/2020 09/15/2020 I25.10 Atherosclerotic heart disease of native coronary artery without angina pectoris 09/15/2020 09/15/2020 L84 Corns and callosities 09/15/2020 09/15/2020 Resolved Problems Electronic Signature(s) Signed: 10/01/2020 5:08:05 PM By: Linton Ham MD Entered By: Linton Ham on 10/01/2020 12:19:22 [name] ([id number]) -------------------------------------------------------------------------------- Progress Note Details Patient Name: [name] Date of Service: 10/01/2020 11:15 AM Medical Record Number: [id number] Patient Account Number: [id number] Date of Birth/Sex: 06/04/1930 (87 y.o. M) Treating RN: Cornell Barman Primary Care Provider: Fulton Reek Other Clinician: Referring Provider: Fulton Reek Treating Provider/Extender: Tito Dine in Treatment: 2 Subjective History of Present Illness (HPI) ADMISSION 05/17/2018 Mr. Lattin is an 87 year old man who is been treated at the lymphedema clinic for a long period with lymphedema wraps for bilateral lower extremity lymphedema. About 6 months ago they noted small open areas on his left lateral calf just above the ankle. These were open. They are apparently putting some form of ointment on this. They have been referred here for our review of this. The patient has a long history of lymphedema and venous stasis. It says in his records that he has recurrent cellulitis although his wife denies this but she states that the lymphedema may have started with cellulitis several years ago. Also in his record there is a history of bladder cancer which they seem to know very little about however he did not have any radiation to the pelvis or anything that could have contributed to lymphedema that they are aware of. There is no prior wound history. The patient has 2 small punched out areas on the left lateral calf that have adherent debris on the surface. Past medical history; lymphedema, hypertension, cellulitis, hearing loss, morbid obesity, osteoarthritis, venous stasis, bladder CA, diverticulosis. ABIs in our clinic were 0.36 on the right and 0.57 on the left 05/24/2018; corrected age on this patient is 52 versus what I said last week. He has been for his arterial studies which predictably are not very good. On the right his ABI is 0.65. Monophasic waveforms noted at the right ankle including the posterior tibial and dorsalis pedis. He has triphasic waveforms of the common femoral and profunda femoris triphasic proximal SFA with monophasic distal FSA and popliteal artery. Monophasic tibial waveforms. On the left his ABI is 0.58 monophasic waveforms at the left ankle. Duplex of the left lower extremity demonstrated atherosclerotic change  with monophasic waveforms throughout the left lower extremity. There was some concern about left iliac disease and potentially femoral-popliteal and tibial disease. The patient does not describe claudication although according to his wife he over emphasizes his activity. Patient states he is limited by pain in both knees His wounds are on the left lateral calf. 2 small areas with necrotic debris. We have been using silver collagen and light Ace wraps 05/31/2018; wounds on the left lateral calf in the setting of very significant lymphedema and probably PAD. We have been using silver collagen. The base of the small wound looks reasonably improved. I sent him to see Dr. Fletcher Anon however I see now he has an appointment with Dr. Alvester Chou on February 12 2/5; left lateral calf wound looks the same. His wife is doing a good job with her lymphedema wraps and maintaining that the edema. He most likely  has PAD and is due to see Dr. Gwenlyn Found of infectious disease on February 12 2/19; left lateral calf wounds look about the same. This looks like wound secondary to chronic venous insufficiency with lymphedema however the patient has very poor arterial studies. We referred him to Dr. Gwenlyn Found. Dr. Gwenlyn Found did not feel he needed to do anything from an arterial point of view. He did not address the question about the aggressiveness of compression. After some thoughts about this I elected to go ahead and put him in 3 layer compression which after all would be less compression then the lymphedema clinic was putting on this. I am hopeful that this should be enough to get some closure of the small wounds 2/26; left lateral calf wound letter this week. He tolerated the 3 layer compression well furthermore he is sleeping in a hospital bed which helps keep his legs up at night. The wound looks better measuring smaller especially in width 3/3; left lateral calf wound about the same size. The 3 layer compression has really helped get the  edema down in his leg however. Using silver collagen 3/11; left lateral calf wound better looking wound surface but about the same size. We have been using 3 layer compression which is really helped get the edema down in the left leg. Even after Dr. Kennon Holter assessment I am reluctant to go to 4 layer compression. He is tolerating 3 layer compression well. We have been using silver collagen 3/18-Patient returns for his left lateral calf wound which overall is looking better, slightly increased in size and dimensions and area, he is tolerating the 3 layer compression, has been dressed with silver collagen which we will continue 3/25 left lateral calf wound much better looking. Size is smaller. He has been using silver collagen 4/1; gradually getting smaller in size. Using silver collagen with 3 layer compression. I think the patient has some degree of PAD. He saw Dr. Gwenlyn Found in consultation, he did not specifically comment on whether he could tolerate 4 layer compression 4/8; the wound is slightly smaller in depth. We are using 3 layer compression with silver collagen. I had him seen for arterial insufficiency however his ABI was 0.57 in the left leg. Dr. Gwenlyn Found really did not seem to middle on the degree of compression he could tolerate 4/15; the wound is slightly smaller and slightly less deep. We have been using 3 layer compression with silver collagen. There is not an option for 4-layer compression here. 4/22; the patient is making decent progress on his wound on the left lateral calf however he arrives in with a superficial wound on the right anterior tibial area. Were not really sure how this happened. He had been using a stocking on the right leg. He has been using silver collagen on the wounds Amazing to me he is he appears to have compression pumps at home. I am not really sure I knew this before. In any case he has not been using them 4/29; left lateral calf wound continues to get gradually  smaller. This looks like it is on its way to closure. The area on the right anterior tibial area is about the same in terms of size superficial without any depth. This was probably wrap injury He tells me that he has been using his compression pumps once a day for 1 hour without any pain 5/6; not as good today. Left lateral calf wound is actually larger and he does not have quite as good edema control.  The area on the right anterior tibia is about the same still superficial. He states he is using his compression pumps once a day. We are using 3 layer compression on the left and Curlex Coban on the right 5/13; the area on the right is very close to being healed. The area on the left still has some depth but has a healthy wound surface. He has excellent edema control bilaterally. Using [facility] ZAKHI, DUPRE ([id number]) 5/20-Patient returns in 1 week after being in 3 layer compression on the left, the left lateral calf ulcer is stable with some slough, the right anterior tibial ulcer is healed we have been using Kerlix Coban on that leg 5/27; Hydrofera Blue and 3 layer compression. Still some slough in the wound area and some nonviable edges around the wound. 6/3; this patient has lymphedema and chronic venous insufficiency. He had difficult to close wound on his left lateral calf. This is closed today. 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 Finally he has known PAD but he is managed to tolerate the compression we reporting on him and also using his compression pumps that he already had at home twice a day 6/10; we discharge this patient last week having finally close the small area in the left lateral calf. Apparently by Friday of last week this had opened and was draining. He is therefore back in clinic. His wife who is present also was concerned about an area on the right anterior tibia."o 6/17; the patient does not have an open area on the right  leg. On the left he has a small circular area anteriorly and the same area laterally that he had last time. However both of these appear to be improved he has his Farrow wrap 30/40. It turns out that he is only using his external compression pumps once a day. I tried to get him up to twice a day today 6/24; the patient comes in with his external compression garment on the right leg/Farrow wrap. As far as we know no open wounds here. He has 2 areas that are smaller one on the left anterior pretibial and one on the left lateral calf which is the site of his initial wounds. I have successfully caught him into his external compression pumps twice a day 7/1; still using his external compression garment on the right leg. As far as we know there are no open wounds here. The area on the left lateral calf which was the site of his initial wound has closed over. The area on the left anterior pretibial that we identified last week is still open. 7/8; as far as we know he has no open wounds on the right leg. He has his external compression garment on here. The original wound on the left lateral calf has closed over. He developed an area on the left anterior pretibial 2 weeks ago and seems to have 2 smaller areas on the proximal and distal lateral calf. We do not have his good edema control this week as I am used to seeing 7/15; the patient has still a linear area on the left anterior tibia. The area on the left lateral leg looked somewhat inflamed today but no cellulitis. He did have what looked to be a small pustule that I cultured for CandS although I did not give him empiric antibiotics. 7/22; small area on the left anterior tibia. The area on the left lateral leg still looks inflamed. The  pustule that I cultured last week grew Enterobacter and Pseudomonas. I prescribed Cipro they still have not picked that up. In any case the area actually looks fairly satisfactory. He has decent edema control using his  compression pumps twice a day 7/29; the area on the left lateral leg looks a lot better than last week. Nothing is open here. He has an area just medial to the mid anterior tibia that is still open that is only open wound today. 8/5-Patient returns at 1 week for the left leg ulcer, which is actually looking better, he is also complaining of left plantar foot pain below the great toe. He does have a hematoma here 8/12-Patient returns at 1 week, the left leg open area looks about the same if not slightly better, the left plantar hematoma under the great toe is about the same, patient apparently has not been resting his foot on anything for too long period of time and does not wear the [employer] shoes unless he walks outdoors which he does not do often 8/19; small wound on the left anterior lower leg this is just about closed there is nothing on the left lateral leg. He is using his compression pumps He arrives in clinic today with a black eschar over the right first plantar metatarsal head. This was apparently a bruise that was identified by her nurses last week. He wears crocs on his feet. He also may rub these on a footboard at home 8/26; there is nothing open on the left lateral lower leg he has a small area just medial to the tibia. He is going to see his podiatrist Dr. Milinda Pointer about the area on the left plantar first metatarsal head. He is using his external compression pumps. He has a juxta lite on the right leg 9/2; there is nothing open on either side of her left lower leg. He went to see podiatry about the area on the left first metatarsal head. This was apparently debrided. He was given mupirocin and oral antibiotics. I do not believe he is offloading this at all. Readmission: 04/02/2019 upon evaluation today patient actually appears for reevaluation here in our clinic secondary to issues that he is having with his left lower extremity. He has been tolerating the dressing changes without  complication. Fortunately there is no signs of active infection at this time. No fevers, chills, nausea, vomiting, or diarrhea. He has been in the hospital since we last saw him and then subsequently transferred back to [facility]. He tells me that they have been changing his dressing once a day and that he has had a significant amount of pain secondary to undergoing these dressing changes. Fortunately there is no evidence of active infection at this point which is good news. No fevers, chills, nausea, vomiting, or diarrhea. With that being said he wonders if we can go back to doing what we were doing before which was when he actually had the dressing changes performed once a week here in the clinic he tells me that he did very well with that. At that point he tolerated a 3 layer compression wrap which was approved by vascular, Dr. Tyrell Antonio office, to be used previous although 4-layer compression wrap is probably too much for him. 04/16/2019 on evaluation today patient appears to be doing decently well with regard to his lower extremity although he has had a lot of drainage from his leg. A lot of this I think is just secondary to the lymphedema with that being  said he does have some odor as well which may again just be due to more of the excessive moisture although I think that he may also have something that is a result of bacteria again he has somewhat of a Pseudomonas-like smell to the drainage and fortunately though he is not showing any signs of severe cellulitis he is still experiencing quite a bit of drainage and the discoloration is somewhat of a yellow/green in color which is consistent with Pseudomonas. If he can tolerated I think he may do well with Cipro. 04/24/2019 on evaluation today patient appears to be doing well with regard to his wound in the gluteal region which is healing quite well. That is excellent news. With that being said he is also doing better with regard to his left  lower extremity overall I feel like things are definitely showing signs of improvement. 12/28-Patient returns with regards to his gluteal wound that is improving, Left lower leg wounds with significant edema in the lower legs that appear to be persistent. Patient is in compression and has lymphedema pumps at home but apparently he does not use consistently 05/07/2019 Upon inspection patient's wound bed actually showed signs on evaluation today patient's wound currently showed signs of improvement. There is still a lot of maceration but it does look like that there is been some of this maceration occurring as a result of drainage in particular and some as a result of the fact that they have been putting bag balm on the wound area as well as the leg in general. I think this is applying too much moisture and not doing him any good. I discussed with the patient and his wife today that I think that practice needs to be adjusted I would recommend using a different type of lotion in order to help with moisture around the wound but I would not recommend anything directly on the wound itself. 05/14/2019 on evaluation today patient actually appears to be doing much better in regard to his lower extremity. They have not been using any of the bag balm on the area I think this has made a tremendous improvement overall in his wounds. He has a lot of new skin growth and though there still are a lot of open wounds scattered throughout the circumferential lower leg this overall seems to be doing much better to me. 05/21/2019 upon evaluation today patient appears to be doing well in regard to his lower extremity although he is having unfortunately some blue- green drainage noted at this point. The does have me concerned for a reinitiation of the Pseudomonas infection that we previously treated in [place] ([id number]) December. The last time I gave him a prescription was April 16, 2019. That was a little over a  month ago. Nonetheless he does have more green drainage noted today and I think this is indicative of potentially a revving up infection. Obviously we do not want to get to that point. 05/28/2019 on evaluation today patient appears to be doing better with regard to his lower extremity at this point. Overall I am seeing signs of improvement which is good news. Fortunately there is no evidence of active infection at this time. I do believe that the Cipro has been beneficial for him that was prescribed last week for 14 days he is halfway through that and already appears to be doing much better. This is excellent news. 06/04/2019 upon evaluation today I do believe that the patient is doing better with regard  to his lower extremity. I feel like that he has much less in the way of open wounds and drainage and much in provement in regard to the overall weeping and even his swelling. There is no signs of erythema no obvious evidence of active infection all of which is great news. No fevers, chills, nausea, vomiting, or diarrhea. 06/11/2019 on evaluation today patient appears to be doing better with regard to his lower extremity on the left. He has been tolerating dressing changes without complication. Fortunately there is no signs of active infection at this time. No fevers, chills, nausea, vomiting, or diarrhea. 06/18/2019 upon evaluation today patient appears to be doing slightly worse compared to last week. They did call me on Friday due to the fact that he had green discharge and both his wife as well as his home health nurse were concerned about what they were seeing. Subsequently I did actually send in a prescription for Cipro for 14 days for him and according to his wife this seems to be doing better based on what she is seeing today compared to Friday. This is great news. Home health is still coming out to see him on Wednesdays and Fridays as well. 06/25/2019 upon evaluation today patient appears to be doing  somewhat better in regard to his leg ulcer. Fortunately there is no signs of active infection at this time which is good news. With that being said I am very pleased with how things seem to be going and overall I see no major issues here I think the alginate is doing well along with the XtraSorb. With regard to the patient's plantar foot he does have somewhat of a blood blister that appears to be resolving at this point his wife states that she feels like the issue is that he is not dropping his shoe in order to keep it from sliding when he walks. Subsequently this is leading to increases in friction which of course is leading to more issues with getting this area to heal the patient is struggling in that regard. 07/02/2019 upon evaluation today patient appears to be making some progress in regard to his lower extremities. With that being said I really feel like the alginate may be getting too moist and then sitting on the skin causing some complications here for [place]. I discussed with the patient and his wife today as well the possibility of going ahead and discontinuing the alginate just utilizing the Xtrasorb to try to keep the edema under better control while at the same time preventing any dressings from staying too moist right on the surface of the wounds. Obviously I think this can do a good job in helping to dry things out but I would recommend that we give this a shot. 07/09/2019 upon evaluation today patient appears to be doing better in regard to some of the edema and weeping in regard to the left lower extremity. Unfortunately he has not been wearing his compression of the right lower extremity and now that is open as well. He needs to obviously be wearing this although for today we will get a half to wrap him due to the fact that this is now open. I explained that he does need to be wearing his compression at all times. 07/16/2019 upon evaluation today patient actually does appear to be making  some progress in regard to his lower extremity ulcers at this point. In fact he really does not have any ulcerations as much as he just does  have lymphedema breakdown causing weeping. Nonetheless he is drying up quite a bit but overall seems to be doing much better. He has a lot of dry flaky skin I think we may want to add triamcinolone to the skin to try to help out with cutting back on some of the irritation here hopefully improve the overall skin quality. He does have Velcro compression wraps once he heals that is good news as well. 07/23/2019 upon evaluation today patient appears to be doing better with regard to his bilateral lower extremities. I think we are getting closer to getting everything sealed up. Again he does not have any significant wounds and his lymphedema has dramatically improved. 07/30/2019 upon evaluation today patient appears to be doing excellent in regard to his bilateral lower extremities. Fortunately there is no signs of active infection at this time. Overall the patient seems to be in fact completely sealed up as far as any areas of weeping or concern which also has me thrilled based on what we are seeing currently. Overall I think that he is doing extremely well. 09/03/2019 upon evaluation today patient appears to be doing somewhat poorly in regard to his left lower extremity with regard to open wounds. Fortunately there is nothing that appears to be draining too terribly although he has about 3 different areas that are open as far as wounds are concerned all 3 seem to be fairly superficial. He was discharged from our clinic on 07/30/2019. Subsequently he reopened in the interim apparently at home health never completely discharged home from being seen. They in fact have still continue to wrap his left leg up until now. 09/10/2019 upon evaluation today patient actually appears to be doing better with regard to his lower extremities. Fortunately there is no signs of active  infection at this time. No fevers, chills, nausea, vomiting, or diarrhea. Overall been fairly pleased with how things seem to be progressing at this point. The patient is likewise happy that his legs look better. He tells me he has been elevating more. With that being said he does not really commit to exactly how much more and his wife tells me he still spends a lot of time with his feet on the ground. 5/19; have not seen this patient in quite some time. He had wounds on his left lower extremity. These are all healed today. He has juxta lites for both legs. He has 1 on the right leg it looks as though he has been putting this on well. His wife emphasizes that he lotions his own legs and puts on his own stocking. He also has external compression pumps that he uses twice a day Readmission: 09/15/2020 upon evaluation today patient appears to be doing somewhat poorly in regard to his bilateral lower extremities. He seems to likely have cellulitis. He is having weeping and drainage due to lymphedema but I do not see any evidence of actual systemic infection at this point which is great news. With that being said I do feel like that this is likely continue to be addressed with some oral antibiotics at minimum. If he gets any worse he is probably need to go to the hospital. The patient's ABI when last checked by Dr. Alvester Chou was on the right 0.65 and the left 0.58 but it was felt that he could tolerate compression and subsequently he is always tolerated the 3 layer compression wrap. Again he does have a history of lymphedema, chronic venous insufficiency, hypertension, coronary artery disease, and callus  buildup on his feet. 5/23 the patient cultured predominantly staph aureus last time however a few Pseudomonas. He has been on Bactrim which would probably not cover the Pseudomonas however that cellulitis in his right leg is completely resolved. HOWEVER he has an open area in the right posterior calf. No open  wound on the left he has not been using his compression pumps because he was not sure he had 2 with the infection present 6/1; everything is closed on both legs including area right posterior. He has a lot of desquamating surface epithelium on the posterior part of the calf I think in the setting of chronic stasis dermatitis. Everything is also closed on the left. They have Farrow wrap external stockings although they did not bring these in today. JAVONTA, GRONAU ([id number]) Objective Constitutional Sitting or standing Blood Pressure is within target range for patient.. Pulse regular and within target range for patient.Marland Kitchen Respirations regular, non- labored and within target range.. Temperature is normal and within the target range for the patient.Marland Kitchen appears in no distress. Vitals Time Taken: 11:45 AM, Height: 67 in, Weight: 250 lbs, BMI: 39.2, Temperature: 97.9 F, Pulse: 82 bpm, Respiratory Rate: 18 breaths/min, Blood Pressure: 115/61 mmHg. General Notes: Wound exam; no open area on the right posterior calf. He still has erythema but no palpable tenderness no weeping. His edema is under much better control. Dry flaking skin predominantly on the right lower leg Integumentary (Hair, Skin) Wound #11 status is Healed - Epithelialized. Original cause of wound was Gradually Appeared. The date acquired was: 08/04/2020. The wound has been in treatment 1 weeks. The wound is located on the Right,Posterior Lower Leg. The wound measures 0cm length x 0cm width x 0cm depth; 0cm^2 area and 0cm^3 volume. There is no tunneling or undermining noted. There is a none present amount of drainage noted. There is no granulation within the wound bed. There is no necrotic tissue within the wound bed. Assessment Active Problems ICD-10 Non-pressure chronic ulcer of right calf with other specified severity Lymphedema, not elsewhere classified Venous insufficiency (chronic) (peripheral) Procedures There was a Three Layer  Compression Therapy Procedure by Dolan Amen, RN. Post procedure Diagnosis Wound #: Same as Pre-Procedure Notes: pt tolerating wraps well. There was a Three Layer Compression Therapy Procedure by Dolan Amen, RN. Post procedure Diagnosis Wound #: Same as Pre-Procedure Notes: pt tolerating wraps well. Plan Follow-up Appointments: Return Appointment in 1 week. Nurse Visit as needed Edema Control - Lymphedema / Segmental Compressive Device / Other: Optional: One layer of unna paste to top of compression wrap (to act as an anchor). 3 Layer Compression System for Lymphedema. - BILATERAL 3 LAYER COMPRESSION WRAP-TCA and lotion Patient to wear own Velcro compression garment. Remove compression stockings every night before going to bed and put on every morning when getting up. - REQUESTS THAT FARROW BE REORDERED AS PRIOR WORN OUT. Gave patient's wife information to call [facility] regarding Lenore Cordia. Elevate, Exercise Daily and Avoid Standing for Long Periods of Time. Elevate legs to the level of the heart and pump ankles as often as possible Compression Pump: Use compression pump on left lower extremity for 60 minutes, twice daily. Compression Pump: Use compression pump on right lower extremity for 60 minutes, twice daily. DO YOUR BEST to sleep in the bed at night. DO NOT sleep in your recliner. Long hours of sitting in a recliner leads to swelling of the legs and/or CLEVEN, JANSMA A. ([id number]) potential wounds on your backside. Other: - CONTINUE  antibiotics as ordered for cellulitis lower legs 1. I think all of this patient's problem is related to chronic stasis dermatitis with secondary lymphedema. We put TCA and moisturizer on his skin. He has no open wound. 2. Still under 3 layer compression they are to bring their stockings next week at which point he will be dischargeable. 3. I think the risk of recidivism is high here however we will have to see how this goes. 4. He will need  continuous moisturizer especially on the right lower leg and I talked to his wife about this they apparently have CeraVe and Cetaphil at home Electronic Signature(s) Signed: 10/01/2020 5:08:05 PM By: Linton Ham MD Entered By: Linton Ham on 10/01/2020 12:22:31 [name] ([id number]) -------------------------------------------------------------------------------- SuperBill Details Patient Name: [name] Date of Service: 10/01/2020 Medical Record Number: [id number] Patient Account Number: [id number] Date of Birth/Sex: 06-26-1930 (87 y.o. M) Treating RN: Dolan Amen Primary Care Provider: Fulton Reek Other Clinician: Referring Provider: Fulton Reek Treating Provider/Extender: Tito Dine in Treatment: 2 Diagnosis Coding ICD-10 Codes Code Description [phone] Non-pressure chronic ulcer of right calf with other specified severity I89.0 Lymphedema, not elsewhere classified I87.2 Venous insufficiency (chronic) (peripheral) Facility Procedures CPT4: Description Modifier Quantity Code [id number] BILATERAL: Application of multi-layer venous compression system; leg (below knee), including 1 ankle and foot. Physician Procedures CPT4 Code: [id number] Description: 81771 - WC PHYS LEVEL 3 - EST PT Modifier: Quantity: 1 CPT4 Code: Description: ICD-10 Diagnosis Description H65.790 Non-pressure chronic ulcer of right calf with other specified severity I89.0 Lymphedema, not elsewhere classified I87.2 Venous insufficiency (chronic) (peripheral) Modifier: Quantity: Electronic Signature(s) Signed: 10/01/2020 5:08:05 PM By: Linton Ham MD Entered By: Linton Ham on 10/01/2020 12:22:50

## 2020-10-02 NOTE — Progress Notes (Signed)
DEUNTAE, KOCSIS ([id number]) Visit Report for 10/01/2020 Arrival Information Details Patient Name: Steven Meza, Steven Meza Date of Service: 10/01/2020 11:15 AM Medical Record Number: [id number] Patient Account Number: [id number] Date of Birth/Sex: 30-Apr-1931 (87 y.o. M) Treating RN: Carlene Coria Primary Care Aasiyah Auerbach: Fulton Reek Other Clinician: Referring Bionca Mckey: Fulton Reek Treating Belvie Iribe/Extender: Tito Dine in Treatment: 2 Visit Information History Since Last Visit All ordered tests and consults were completed: No Patient Arrived: [name] Added or deleted any medications: No Arrival Time: 11:45 Any new allergies or adverse reactions: No Accompanied By: wife Had a fall or experienced change in No Transfer Assistance: Manual activities of daily living that may affect Patient Identification Verified: Yes risk of falls: Secondary Verification Process Completed: Yes Signs or symptoms of abuse/neglect since last visito No Patient Requires Transmission-Based Precautions: No Hospitalized since last visit: No Patient Has Alerts: Yes Implantable device outside of the clinic excluding No Patient Alerts: ABI right .65 cellular tissue based products placed in the center ABI left .58 since last visit: Has Dressing in Place as Prescribed: Yes Has Compression in Place as Prescribed: Yes Pain Present Now: No Electronic Signature(s) Signed: 10/02/2020 4:50:12 PM By: Carlene Coria RN Entered By: Carlene Coria on 10/01/2020 11:45:24 [name] ([id number]) -------------------------------------------------------------------------------- Clinic Level of Care Assessment Details Patient Name: [name] Date of Service: 10/01/2020 11:15 AM Medical Record Number: [id number] Patient Account Number: [id number] Date of Birth/Sex: [birth year] (87 y.o. M) Treating RN: Dolan Amen Primary Care Hasnain Manheim: Fulton Reek Other Clinician: Referring Bama Hanselman: Fulton Reek Treating Chayce Robbins/Extender: Tito Dine in Treatment: 2 Clinic Level of Care Assessment Items TOOL 1 Quantity Score []  - Use when EandM and Procedure is performed on INITIAL visit 0 ASSESSMENTS - Nursing Assessment / Reassessment []  - General Physical Exam (combine w/ comprehensive assessment (listed just below) when performed on new 0 pt. evals) []  - 0 Comprehensive Assessment (HX, ROS, Risk Assessments, Wounds Hx, etc.) ASSESSMENTS - Wound and Skin Assessment / Reassessment []  - Dermatologic / Skin Assessment (not related to wound area) 0 ASSESSMENTS - Ostomy and/or Continence Assessment and Care []  - Incontinence Assessment and Management 0 []  - 0 Ostomy Care Assessment and Management (repouching, etc.) PROCESS - Coordination of Care []  - Simple Patient / Family Education for ongoing care 0 []  - 0 Complex (extensive) Patient / Family Education for ongoing care []  - 0 Staff obtains Programmer, systems, Records, Test Results / Process Orders []  - 0 Staff telephones HHA, Nursing Homes / Clarify orders / etc []  - 0 Routine Transfer to another Facility (non-emergent condition) []  - 0 Routine Hospital Admission (non-emergent condition) []  - 0 New Admissions / Biomedical engineer / Ordering NPWT, Apligraf, etc. []  - 0 Emergency Hospital Admission (emergent condition) PROCESS - Special Needs []  - Pediatric / Minor Patient Management 0 []  - 0 Isolation Patient Management []  - 0 Hearing / Language / Visual special needs []  - 0 Assessment of Community assistance (transportation, D/C planning, etc.) []  - 0 Additional assistance / Altered mentation []  - 0 Support Surface(s) Assessment (bed, cushion, seat, etc.) INTERVENTIONS - Miscellaneous []  - External ear exam 0 []  - 0 Patient Transfer (multiple staff / Civil Service fast streamer / Similar devices) []  - 0 Simple Staple / Suture removal (25 or less) []  - 0 Complex Staple / Suture removal (26 or more) []  -  0 Hypo/Hyperglycemic Management (do not check if billed separately) []  - 0 Ankle / Brachial Index (ABI) - do not check if billed separately Has  the patient been seen at the hospital within the last three years: Yes Total Score: 0 Level Of Care: ____ [name] ([id number]) Electronic Signature(s) Signed: 10/01/2020 4:52:16 PM By: Georges Mouse, Minus Breeding RN Entered By: Georges Mouse, Minus Breeding on 10/01/2020 12:05:47 [name] ([id number]) -------------------------------------------------------------------------------- Compression Therapy Details Patient Name: [name] Date of Service: 10/01/2020 11:15 AM Medical Record Number: [id number] Patient Account Number: [id number] Date of Birth/Sex: 07/19/1930 (87 y.o. M) Treating RN: Dolan Amen Primary Care Venecia Mehl: Fulton Reek Other Clinician: Referring Rumi Taras: Fulton Reek Treating Sausha Raymond/Extender: Tito Dine in Treatment: 2 Compression Therapy Performed for Wound Assessment: NonWound Condition Lymphedema - Left Leg Performed By: Clinician Dolan Amen, RN Compression Type: Three Layer Post Procedure Diagnosis Same as Pre-procedure Notes pt tolerating wraps well Electronic Signature(s) Signed: 10/01/2020 4:52:16 PM By: Georges Mouse, Minus Breeding RN Entered By: Georges Mouse, Minus Breeding on 10/01/2020 12:04:42 [name] ([id number]) -------------------------------------------------------------------------------- Compression Therapy Details Patient Name: [name] Date of Service: 10/01/2020 11:15 AM Medical Record Number: [id number] Patient Account Number: [id number] Date of Birth/Sex: Aug 31, 1930 (87 y.o. M) Treating RN: Dolan Amen Primary Care Azlan Hanway: Fulton Reek Other Clinician: Referring Timmy Cleverly: Fulton Reek Treating Uriel Dowding/Extender: Tito Dine in Treatment: 2 Compression Therapy Performed for Wound Assessment: NonWound Condition Lymphedema - Right  Leg Performed By: Clinician Dolan Amen, RN Compression Type: Three Layer Post Procedure Diagnosis Same as Pre-procedure Notes pt tolerating wraps well Electronic Signature(s) Signed: 10/01/2020 4:52:16 PM By: Georges Mouse, Minus Breeding RN Entered By: Georges Mouse, Minus Breeding on 10/01/2020 12:04:56 [name] ([id number]) -------------------------------------------------------------------------------- Encounter Discharge Information Details Patient Name: [name] Date of Service: 10/01/2020 11:15 AM Medical Record Number: [id number] Patient Account Number: [id number] Date of Birth/Sex: 11/09/1930 (87 y.o. M) Treating RN: Cornell Barman Primary Care Shajuana Mclucas: Fulton Reek Other Clinician: Referring Rio Kidane: Fulton Reek Treating Shelton Square/Extender: Tito Dine in Treatment: 2 Encounter Discharge Information Items Discharge Condition: Stable Ambulatory Status: Walker Discharge Destination: Home Transportation: Private Auto Accompanied By: wife Schedule Follow-up Appointment: Yes Clinical Summary of Care: Electronic Signature(s) Signed: 10/01/2020 4:53:17 PM By: Jeanine Luz Entered By: Jeanine Luz on 10/01/2020 12:25:47 [name] ([id number]) -------------------------------------------------------------------------------- Lower Extremity Assessment Details Patient Name: [name] Date of Service: 10/01/2020 11:15 AM Medical Record Number: [id number] Patient Account Number: [id number] Date of Birth/Sex: 1930-11-22 (87 y.o. M) Treating RN: Carlene Coria Primary Care Ezri Fanguy: Fulton Reek Other Clinician: Referring Nala Kachel: Fulton Reek Treating Suvi Archuletta/Extender: Tito Dine in Treatment: 2 Edema Assessment Assessed: [Left: No] [Right: No] [Left: Edema] [Right: :] Calf Left: Right: Point of Measurement: 35 cm From Medial Instep 43 cm 38 cm Ankle Left: Right: Point of Measurement: 10 cm From Medial Instep 29 cm 27  cm Vascular Assessment Pulses: Dorsalis Pedis Palpable: [Left:Yes] Electronic Signature(s) Signed: 10/02/2020 4:50:12 PM By: Carlene Coria RN Entered By: Carlene Coria on 10/01/2020 11:47:40 [name] ([id number]) -------------------------------------------------------------------------------- Multi Wound Chart Details Patient Name: [name] Date of Service: 10/01/2020 11:15 AM Medical Record Number: [id number] Patient Account Number: [id number] Date of Birth/Sex: 10/07/1930 (87 y.o. M) Treating RN: Dolan Amen Primary Care Amear Strojny: Fulton Reek Other Clinician: Referring Nyomi Howser: Fulton Reek Treating Orlanda Frankum/Extender: Tito Dine in Treatment: 2 Vital Signs Height(in): 43 Pulse(bpm): 72 Weight(lbs): 250 Blood Pressure(mmHg): 115/61 Body Mass Index(BMI): 39 Temperature(F): 97.9 Respiratory Rate(breaths/min): 18 Photos: [N/A:N/A] Wound Location: Right, Posterior Lower Leg N/A N/A Wounding Event: Gradually Appeared N/A N/A Primary Etiology: Lymphedema N/A N/A Comorbid History: Lymphedema, Coronary Artery N/A N/A Disease, Hypertension  Date Acquired: 08/04/2020 N/A N/A Weeks of Treatment: 1 N/A N/A Wound Status: Healed - Epithelialized N/A N/A Measurements L x W x D (cm) 0x0x0 N/A N/A Area (cm) : 0 N/A N/A Volume (cm) : 0 N/A N/A % Reduction in Area: 100.00% N/A N/A % Reduction in Volume: 100.00% N/A N/A Classification: Full Thickness Without Exposed N/A N/A Support Structures Exudate Amount: None Present N/A N/A Granulation Amount: None Present (0%) N/A N/A Necrotic Amount: None Present (0%) N/A N/A Exposed Structures: Fascia: No N/A N/A Fat Layer (Subcutaneous Tissue): No Tendon: No Muscle: No Joint: No Bone: No Epithelialization: Large (67-100%) N/A N/A Treatment Notes Electronic Signature(s) Signed: 10/01/2020 5:08:05 PM By: Linton Ham MD Entered By: Linton Ham on 10/01/2020 12:19:28 [name]  ([id number]) -------------------------------------------------------------------------------- Multi-Disciplinary Care Plan Details Patient Name: [name] Date of Service: 10/01/2020 11:15 AM Medical Record Number: [id number] Patient Account Number: [id number] Date of Birth/Sex: 1930-06-28 (87 y.o. M) Treating RN: Dolan Amen Primary Care Bailey Faiella: Fulton Reek Other Clinician: Referring Ramonia Mcclaran: Fulton Reek Treating Chirag Krueger/Extender: Tito Dine in Treatment: 2 Active Inactive Electronic Signature(s) Signed: 10/01/2020 4:52:16 PM By: Georges Mouse, Minus Breeding RN Entered By: Georges Mouse, Minus Breeding on 10/01/2020 12:02:54 [name] ([id number]) -------------------------------------------------------------------------------- Pain Assessment Details Patient Name: [name] Date of Service: 10/01/2020 11:15 AM Medical Record Number: [id number] Patient Account Number: [id number] Date of Birth/Sex: 03-07-1931 (87 y.o. M) Treating RN: Carlene Coria Primary Care Erline Siddoway: Fulton Reek Other Clinician: Referring Trypp Heckmann: Fulton Reek Treating Catalena Stanhope/Extender: Tito Dine in Treatment: 2 Active Problems Location of Pain Severity and Description of Pain Patient Has Paino No Site Locations Pain Management and Medication Current Pain Management: Electronic Signature(s) Signed: 10/02/2020 4:50:12 PM By: Carlene Coria RN Entered By: Carlene Coria on 10/01/2020 11:45:49 [name] ([id number]) -------------------------------------------------------------------------------- Patient/Caregiver Education Details Patient Name: [name] Date of Service: 10/01/2020 11:15 AM Medical Record Number: [id number] Patient Account Number: [id number] Date of Birth/Gender: 18-Jan-1931 (87 y.o. M) Treating RN: Dolan Amen Primary Care Physician: Fulton Reek Other Clinician: Referring Physician: Fulton Reek Treating Physician/Extender:  Tito Dine in Treatment: 2 Education Assessment Education Provided To: Patient Education Topics Provided Wound/Skin Impairment: Methods: Explain/Verbal Responses: State content correctly Electronic Signature(s) Signed: 10/01/2020 4:52:16 PM By: Georges Mouse, Minus Breeding RN Entered By: Georges Mouse, Minus Breeding on 10/01/2020 12:06:03 [name] ([id number]) -------------------------------------------------------------------------------- Wound Assessment Details Patient Name: [name] Date of Service: 10/01/2020 11:15 AM Medical Record Number: [id number] Patient Account Number: [id number] Date of Birth/Sex: Sep 09, 1930 (87 y.o. M) Treating RN: Carlene Coria Primary Care Ramona Ruark: Fulton Reek Other Clinician: Referring Kamee Bobst: Fulton Reek Treating Lynita Groseclose/Extender: Tito Dine in Treatment: 2 Wound Status Wound Number: 11 Primary Etiology: Lymphedema Wound Location: Right, Posterior Lower Leg Wound Status: Healed - Epithelialized Wounding Event: Gradually Appeared Comorbid Lymphedema, Coronary Artery Disease, History: Hypertension Date Acquired: 08/04/2020 Weeks Of Treatment: 1 Clustered Wound: No Photos Wound Measurements Length: (cm) 0 Width: (cm) 0 Depth: (cm) 0 Area: (cm) 0 Volume: (cm) 0 % Reduction in Area: 100% % Reduction in Volume: 100% Epithelialization: Large (67-100%) Tunneling: No Undermining: No Wound Description Classification: Full Thickness Without Exposed Support Structure Exudate Amount: None Present s Foul Odor After Cleansing: No Slough/Fibrino No Wound Bed Granulation Amount: None Present (0%) Exposed Structure Necrotic Amount: None Present (0%) Fascia Exposed: No Fat Layer (Subcutaneous Tissue) Exposed: No Tendon Exposed: No Muscle Exposed: No Joint Exposed: No Bone Exposed: No Electronic Signature(s) Signed: 10/01/2020 4:52:16 PM By: Charlett Nose RN  Signed: 10/02/2020 4:50:12 PM By: Carlene Coria RN Entered By: Georges Mouse, Minus Breeding on 10/01/2020 12:03:43 [name] ([id number]) -------------------------------------------------------------------------------- Vitals Details Patient Name: [name] Date of Service: 10/01/2020 11:15 AM Medical Record Number: [id number] Patient Account Number: [id number] Date of Birth/Sex: Mar 16, 1931 (87 y.o. M) Treating RN: Carlene Coria Primary Care Sierrah Luevano: Fulton Reek Other Clinician: Referring Fenix Rorke: Fulton Reek Treating Brieann Osinski/Extender: Tito Dine in Treatment: 2 Vital Signs Time Taken: 11:45 Temperature (F): 97.9 Height (in): 67 Pulse (bpm): 82 Weight (lbs): 250 Respiratory Rate (breaths/min): 18 Body Mass Index (BMI): 39.2 Blood Pressure (mmHg): 115/61 Reference Range: 80 - 120 mg / dl Electronic Signature(s) Signed: 10/02/2020 4:50:12 PM By: Carlene Coria RN Entered By: Carlene Coria on 10/01/2020 11:45:41

## 2020-10-07 ENCOUNTER — Ambulatory Visit: Payer: Medicare Other | Admitting: Physician Assistant

## 2020-10-08 ENCOUNTER — Other Ambulatory Visit: Payer: Medicare Other

## 2020-10-08 ENCOUNTER — Encounter: Payer: Medicare Other | Admitting: Internal Medicine

## 2020-10-08 ENCOUNTER — Other Ambulatory Visit: Payer: Self-pay

## 2020-10-08 DIAGNOSIS — L97218 Non-pressure chronic ulcer of right calf with other specified severity: Secondary | ICD-10-CM | POA: Diagnosis not present

## 2020-10-09 NOTE — Progress Notes (Signed)
CYPRESS, FANFAN ([id number]) Visit Report for 10/08/2020 HPI Details Patient Name: Steven Meza, Steven Meza Date of Service: 10/08/2020 2:30 PM Medical Record Number: [id number] Patient Account Number: [id number] Date of Birth/Sex: 1930/10/13 (87 y.o. M) Treating RN: Cornell Barman Primary Care Provider: Fulton Reek Other Clinician: Referring Provider: Fulton Reek Treating Provider/Extender: Tito Dine in Treatment: 3 History of Present Illness HPI Description: ADMISSION 05/17/2018 Mr. Elza is an 87 year old man who is been treated at the lymphedema clinic for a long period with lymphedema wraps for bilateral lower extremity lymphedema. About 6 months ago they noted small open areas on his left lateral calf just above the ankle. These were open. They are apparently putting some form of ointment on this. They have been referred here for our review of this. The patient has a long history of lymphedema and venous stasis. It says in his records that he has recurrent cellulitis although his wife denies this but she states that the lymphedema may have started with cellulitis several years ago. Also in his record there is a history of bladder cancer which they seem to know very little about however he did not have any radiation to the pelvis or anything that could have contributed to lymphedema that they are aware of. There is no prior wound history. The patient has 2 small punched out areas on the left lateral calf that have adherent debris on the surface. Past medical history; lymphedema, hypertension, cellulitis, hearing loss, morbid obesity, osteoarthritis, venous stasis, bladder CA, diverticulosis. ABIs in our clinic were 0.36 on the right and 0.57 on the left 05/24/2018; corrected age on this patient is 21 versus what I said last week. He has been for his arterial studies which predictably are not very good. On the right his ABI is 0.65. Monophasic waveforms noted at the right ankle  including the posterior tibial and dorsalis pedis. He has triphasic waveforms of the common femoral and profunda femoris triphasic proximal SFA with monophasic distal FSA and popliteal artery. Monophasic tibial waveforms. On the left his ABI is 0.58 monophasic waveforms at the left ankle. Duplex of the left lower extremity demonstrated atherosclerotic change with monophasic waveforms throughout the left lower extremity. There was some concern about left iliac disease and potentially femoral-popliteal and tibial disease. The patient does not describe claudication although according to his wife he over emphasizes his activity. Patient states he is limited by pain in both knees His wounds are on the left lateral calf. 2 small areas with necrotic debris. We have been using silver collagen and light Ace wraps 05/31/2018; wounds on the left lateral calf in the setting of very significant lymphedema and probably PAD. We have been using silver collagen. The base of the small wound looks reasonably improved. I sent him to see Dr. Fletcher Anon however I see now he has an appointment with Dr. Alvester Chou on February 12 2/5; left lateral calf wound looks the same. His wife is doing a good job with her lymphedema wraps and maintaining that the edema. He most likely has PAD and is due to see Dr. Gwenlyn Found of infectious disease on February 12 2/19; left lateral calf wounds look about the same. This looks like wound secondary to chronic venous insufficiency with lymphedema however the patient has very poor arterial studies. We referred him to Dr. Gwenlyn Found. Dr. Gwenlyn Found did not feel he needed to do anything from an arterial point of view. He did not address the question about the aggressiveness of compression. After some thoughts about  this I elected to go ahead and put him in 3 layer compression which after all would be less compression then the lymphedema clinic was putting on this. I am hopeful that this should be enough to get some  closure of the small wounds 2/26; left lateral calf wound letter this week. He tolerated the 3 layer compression well furthermore he is sleeping in a hospital bed which helps keep his legs up at night. The wound looks better measuring smaller especially in width 3/3; left lateral calf wound about the same size. The 3 layer compression has really helped get the edema down in his leg however. Using silver collagen 3/11; left lateral calf wound better looking wound surface but about the same size. We have been using 3 layer compression which is really helped get the edema down in the left leg. Even after Dr. Kennon Holter assessment I am reluctant to go to 4 layer compression. He is tolerating 3 layer compression well. We have been using silver collagen 3/18-Patient returns for his left lateral calf wound which overall is looking better, slightly increased in size and dimensions and area, he is tolerating the 3 layer compression, has been dressed with silver collagen which we will continue 3/25oleft lateral calf wound much better looking. Size is smaller. He has been using silver collagen 4/1; gradually getting smaller in size. Using silver collagen with 3 layer compression. I think the patient has some degree of PAD. He saw Dr. Gwenlyn Found in consultation, he did not specifically comment on whether he could tolerate 4 layer compression 4/8; the wound is slightly smaller in depth. We are using 3 layer compression with silver collagen. I had him seen for arterial insufficiency however his ABI was 0.57 in the left leg. Dr. Gwenlyn Found really did not seem to middle on the degree of compression he could tolerate 4/15; the wound is slightly smaller and slightly less deep. We have been using 3 layer compression with silver collagen. There is not an option for 4-layer compression here. 4/22; the patient is making decent progress on his wound on the left lateral calf however he arrives in with a superficial wound on the  right anterior tibial area. Were not really sure how this happened. He had been using a stocking on the right leg. He has been using silver collagen on the wounds Amazing to me he is he appears to have compression pumps at home. I am not really sure I knew this before. In any case he has not been using them 4/29; left lateral calf wound continues to get gradually smaller. This looks like it is on its way to closure. oThe area on the right anterior tibial area is about the same in terms of size superficial without any depth. This was probably wrap injury oHe tells me that he has been using his compression pumps once a day for 1 hour without any pain 5/6; not as good today. Left lateral calf wound is actually larger and he does not have quite as good edema control. The area on the right anterior KRYSTOPHER, KUENZEL A. ([id number]) tibia is about the same still superficial. He states he is using his compression pumps once a day. We are using 3 layer compression on the left and Curlex Coban on the right 5/13; the area on the right is very close to being healed. The area on the left still has some depth but has a healthy wound surface. He has excellent edema control bilaterally. Using Hydrofera Blue 5/20-Patient returns  in 1 week after being in 3 layer compression on the left, the left lateral calf ulcer is stable with some slough, the right anterior tibial ulcer is healed we have been using Kerlix Coban on that leg 5/27; Hydrofera Blue and 3 layer compression. Still some slough in the wound area and some nonviable edges around the wound. 6/3; this patient has lymphedema and chronic venous insufficiency. He had difficult to close wound on his left lateral calf. This is closed today. 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 Finally he has known PAD but he is managed to tolerate the compression we reporting on him and also using his compression pumps that he already had  at home twice a day 6/10; we discharge this patient last week having finally close the small area in the left lateral calf. Apparently by Friday of last week this had opened and was draining. He is therefore back in clinic. His wife who is present also was concerned about an area on the right anterior tibia."o 6/17; the patient does not have an open area on the right leg. On the left he has a small circular area anteriorly and the same area laterally that he had last time. However both of these appear to be improved he has his Farrow wrap 30/40. It turns out that he is only using his external compression pumps once a day. I tried to get him up to twice a day today 6/24; the patient comes in with his external compression garment on the right leg/Farrow wrap. As far as we know no open wounds here. He has 2 areas that are smaller one on the left anterior pretibial and one on the left lateral calf which is the site of his initial wounds. I have successfully caught him into his external compression pumps twice a day 7/1; still using his external compression garment on the right leg. As far as we know there are no open wounds here. The area on the left lateral calf which was the site of his initial wound has closed over. The area on the left anterior pretibial that we identified last week is still open. 7/8; as far as we know he has no open wounds on the right leg. He has his external compression garment on here. The original wound on the left lateral calf has closed over. He developed an area on the left anterior pretibial 2 weeks ago and seems to have 2 smaller areas on the proximal and distal lateral calf. We do not have his good edema control this week as I am used to seeing 7/15; the patient has still a linear area on the left anterior tibia. The area on the left lateral leg looked somewhat inflamed today but no cellulitis. He did have what looked to be a small pustule that I cultured for CandS  although I did not give him empiric antibiotics. 7/22; small area on the left anterior tibia. The area on the left lateral leg still looks inflamed. The pustule that I cultured last week grew Enterobacter and Pseudomonas. I prescribed Cipro they still have not picked that up. In any case the area actually looks fairly satisfactory. He has decent edema control using his compression pumps twice a day 7/29; the area on the left lateral leg looks a lot better than last week. Nothing is open here. He has an area just medial to the mid anterior tibia that is still open that is only open wound  today. 8/5-Patient returns at 1 week for the left leg ulcer, which is actually looking better, he is also complaining of left plantar foot pain below the great toe. He does have a hematoma here 8/12-Patient returns at 1 week, the left leg open area looks about the same if not slightly better, the left plantar hematoma under the great toe is about the same, patient apparently has not been resting his foot on anything for too long period of time and does not wear the Birkenstock shoes unless he walks outdoors which he does not do often 8/19; small wound on the left anterior lower leg this is just about closed there is nothing on the left lateral leg. He is using his compression pumps He arrives in clinic today with a black eschar over the right first plantar metatarsal head. This was apparently a bruise that was identified by her nurses last week. He wears crocs on his feet. He also may rub these on a footboard at home 8/26; there is nothing open on the left lateral lower leg he has a small area just medial to the tibia. He is going to see his podiatrist Dr. Milinda Pointer about the area on the left plantar first metatarsal head. He is using his external compression pumps. He has a juxta lite on the right leg 9/2; there is nothing open on either side of her left lower leg. He went to see podiatry about the area on the left first  metatarsal head. This was apparently debrided. He was given mupirocin and oral antibiotics. I do not believe he is offloading this at all. Readmission: 04/02/2019 upon evaluation today patient actually appears for reevaluation here in our clinic secondary to issues that he is having with his left lower extremity. He has been tolerating the dressing changes without complication. Fortunately there is no signs of active infection at this time. No fevers, chills, nausea, vomiting, or diarrhea. He has been in the hospital since we last saw him and then subsequently transferred back to [facility]. He tells me that they have been changing his dressing once a day and that he has had a significant amount of pain secondary to undergoing these dressing changes. Fortunately there is no evidence of active infection at this point which is good news. No fevers, chills, nausea, vomiting, or diarrhea. With that being said he wonders if we can go back to doing what we were doing before which was when he actually had the dressing changes performed once a week here in the clinic he tells me that he did very well with that. At that point he tolerated a 3 layer compression wrap which was approved by vascular, Dr. Tyrell Antonio office, to be used previous although 4-layer compression wrap is probably too much for him. 04/16/2019 on evaluation today patient appears to be doing decently well with regard to his lower extremity although he has had a lot of drainage from his leg. A lot of this I think is just secondary to the lymphedema with that being said he does have some odor as well which may again just be due to more of the excessive moisture although I think that he may also have something that is a result of bacteria again he has somewhat of a Pseudomonas-like smell to the drainage and fortunately though he is not showing any signs of severe cellulitis he is still experiencing quite a bit of drainage and the  discoloration is somewhat of a yellow/green in color which is consistent  with Pseudomonas. If he can tolerated I think he may do well with Cipro. 04/24/2019 on evaluation today patient appears to be doing well with regard to his wound in the gluteal region which is healing quite well. That is excellent news. With that being said he is also doing better with regard to his left lower extremity overall I feel like things are definitely showing signs of improvement. 12/28-Patient returns with regards to his gluteal wound that is improving, Left lower leg wounds with significant edema in the lower legs that appear to be persistent. Patient is in compression and has lymphedema pumps at home but apparently he does not use consistently 05/07/2019 Upon inspection patient's wound bed actually showed signs on evaluation today patient's wound currently showed signs of improvement. There is still a lot of maceration but it does look like that there is been some of this maceration occurring as a result of drainage in particular and some as a result of the fact that they have been putting bag balm on the wound area as well as the leg in general. I think this is applying too much moisture and not doing him any good. I discussed with the patient and his wife today that I think that practice needs to be adjusted I would recommend using a different type of lotion in order to help with moisture around the wound but I would not recommend anything directly on the wound itself. 05/14/2019 on evaluation today patient actually appears to be doing much better in regard to his lower extremity. They have not been using any of the bag balm on the area I think this has made a tremendous improvement overall in his wounds. He has a lot of new skin growth and though ANTWOIN, LACKEY ([id number]) there still are a lot of open wounds scattered throughout the circumferential lower leg this overall seems to be doing much better to  me. 05/21/2019 upon evaluation today patient appears to be doing well in regard to his lower extremity although he is having unfortunately some blue- green drainage noted at this point. The does have me concerned for a reinitiation of the Pseudomonas infection that we previously treated in December. The last time I gave him a prescription was April 16, 2019. That was a little over a month ago. Nonetheless he does have more green drainage noted today and I think this is indicative of potentially a revving up infection. Obviously we do not want to get to that point. 05/28/2019 on evaluation today patient appears to be doing better with regard to his lower extremity at this point. Overall I am seeing signs of improvement which is good news. Fortunately there is no evidence of active infection at this time. I do believe that the Cipro has been beneficial for him that was prescribed last week for 14 days he is halfway through that and already appears to be doing much better. This is excellent news. 06/04/2019 upon evaluation today I do believe that the patient is doing better with regard to his lower extremity. I feel like that he has much less in the way of open wounds and drainage and much in provement in regard to the overall weeping and even his swelling. There is no signs of erythema no obvious evidence of active infection all of which is great news. No fevers, chills, nausea, vomiting, or diarrhea. 06/11/2019 on evaluation today patient appears to be doing better with regard to his lower extremity on the left. He has been  tolerating dressing changes without complication. Fortunately there is no signs of active infection at this time. No fevers, chills, nausea, vomiting, or diarrhea. 06/18/2019 upon evaluation today patient appears to be doing slightly worse compared to last week. They did call me on Friday due to the fact that he had green discharge and both his wife as well as his home health nurse were  concerned about what they were seeing. Subsequently I did actually send in a prescription for Cipro for 14 days for him and according to his wife this seems to be doing better based on what she is seeing today compared to Friday. This is great news. Home health is still coming out to see him on Wednesdays and Fridays as well. 06/25/2019 upon evaluation today patient appears to be doing somewhat better in regard to his leg ulcer. Fortunately there is no signs of active infection at this time which is good news. With that being said I am very pleased with how things seem to be going and overall I see no major issues here I think the alginate is doing well along with the XtraSorb. With regard to the patient's plantar foot he does have somewhat of a blood blister that appears to be resolving at this point his wife states that she feels like the issue is that he is not dropping his shoe in order to keep it from sliding when he walks. Subsequently this is leading to increases in friction which of course is leading to more issues with getting this area to heal the patient is struggling in that regard. 07/02/2019 upon evaluation today patient appears to be making some progress in regard to his lower extremities. With that being said I really feel like the alginate may be getting too moist and then sitting on the skin causing some complications here for [place]. I discussed with the patient and his wife today as well the possibility of going ahead and discontinuing the alginate just utilizing the Xtrasorb to try to keep the edema under better control while at the same time preventing any dressings from staying too moist right on the surface of the wounds. Obviously I think this can do a good job in helping to dry things out but I would recommend that we give this a shot. 07/09/2019 upon evaluation today patient appears to be doing better in regard to some of the edema and weeping in regard to the left lower extremity.  Unfortunately he has not been wearing his compression of the right lower extremity and now that is open as well. He needs to obviously be wearing this although for today we will get a half to wrap him due to the fact that this is now open. I explained that he does need to be wearing his compression at all times. 07/16/2019 upon evaluation today patient actually does appear to be making some progress in regard to his lower extremity ulcers at this point. In fact he really does not have any ulcerations as much as he just does have lymphedema breakdown causing weeping. Nonetheless he is drying up quite a bit but overall seems to be doing much better. He has a lot of dry flaky skin I think we may want to add triamcinolone to the skin to try to help out with cutting back on some of the irritation here hopefully improve the overall skin quality. He does have Velcro compression wraps once he heals that is good news as well. 07/23/2019 upon evaluation today patient appears  to be doing better with regard to his bilateral lower extremities. I think we are getting closer to getting everything sealed up. Again he does not have any significant wounds and his lymphedema has dramatically improved. 07/30/2019 upon evaluation today patient appears to be doing excellent in regard to his bilateral lower extremities. Fortunately there is no signs of active infection at this time. Overall the patient seems to be in fact completely sealed up as far as any areas of weeping or concern which also has me thrilled based on what we are seeing currently. Overall I think that he is doing extremely well. 09/03/2019 upon evaluation today patient appears to be doing somewhat poorly in regard to his left lower extremity with regard to open wounds. Fortunately there is nothing that appears to be draining too terribly although he has about 3 different areas that are open as far as wounds are concerned all 3 seem to be fairly superficial. He  was discharged from our clinic on 07/30/2019. Subsequently he reopened in the interim apparently at home health never completely discharged home from being seen. They in fact have still continue to wrap his left leg up until now. 09/10/2019 upon evaluation today patient actually appears to be doing better with regard to his lower extremities. Fortunately there is no signs of active infection at this time. No fevers, chills, nausea, vomiting, or diarrhea. Overall been fairly pleased with how things seem to be progressing at this point. The patient is likewise happy that his legs look better. He tells me he has been elevating more. With that being said he does not really commit to exactly how much more and his wife tells me he still spends a lot of time with his feet on the ground. 5/19; have not seen this patient in quite some time. He had wounds on his left lower extremity. These are all healed today. He has juxta lites for both legs. He has 1 on the right leg it looks as though he has been putting this on well. His wife emphasizes that he lotions his own legs and puts on his own stocking. He also has external compression pumps that he uses twice a day Readmission: 09/15/2020 upon evaluation today patient appears to be doing somewhat poorly in regard to his bilateral lower extremities. He seems to likely have cellulitis. He is having weeping and drainage due to lymphedema but I do not see any evidence of actual systemic infection at this point which is great news. With that being said I do feel like that this is likely continue to be addressed with some oral antibiotics at minimum. If he gets any worse he is probably need to go to the hospital. The patient's ABI when last checked by Dr. Alvester Chou was on the right 0.65 and the left 0.58 but it was felt that he could tolerate compression and subsequently he is always tolerated the 3 layer compression wrap. Again he does have a history of lymphedema, chronic  venous insufficiency, hypertension, coronary artery disease, and callus buildup on his feet. 5/23 the patient cultured predominantly staph aureus last time however a few Pseudomonas. He has been on Bactrim which would probably not cover the Pseudomonas however that cellulitis in his right leg is completely resolved. HOWEVER he has an open area in the right posterior calf. No open wound on the left he has not been using his compression pumps because he was not sure he had 2 with the infection present 6/1; everything is closed  on both legs including area right posterior. He has a lot of desquamating surface epithelium on the posterior part of the calf I think in the setting of chronic stasis dermatitis. Everything is also closed on the left. They have Farrow wrap external stockings although GORGE, ALMANZA. ([id number]) they did not bring these in today. 6/8; everything was closed again this week. He has his bilateral Farrow wrap 4000. We put these on for the first time today. He has severe chronic venous insufficiency and secondary lymphedema. He has compression pumps at home and he is faithfully using these twice a day Electronic Signature(s) Signed: 10/08/2020 4:14:58 PM By: Linton Ham MD Entered By: Linton Ham on 10/08/2020 15:05:30 [name] ([id number]) -------------------------------------------------------------------------------- Physical Exam Details Patient Name: [name] Date of Service: 10/08/2020 2:30 PM Medical Record Number: [id number] Patient Account Number: [id number] Date of Birth/Sex: 1930/05/13 (87 y.o. M) Treating RN: Cornell Barman Primary Care Provider: Fulton Reek Other Clinician: Referring Provider: Fulton Reek Treating Provider/Extender: Tito Dine in Treatment: 3 Constitutional Sitting or standing Blood Pressure is within target range for patient.. Pulse regular and within target range for patient.Marland Kitchen Respirations regular,  non- labored and within target range.. Temperature is normal and within the target range for the patient.Marland Kitchen appears in no distress. Notes Wound exam; there is no open wounds bilaterally. He still has stasis dermatitis and flaking dry skin. Pedal pulses are palpable his edema is well controlled and are compression Electronic Signature(s) Signed: 10/08/2020 4:14:58 PM By: Linton Ham MD Entered By: Linton Ham on 10/08/2020 15:07:43 [name] ([id number]) -------------------------------------------------------------------------------- Physician Orders Details Patient Name: [name] Date of Service: 10/08/2020 2:30 PM Medical Record Number: [id number] Patient Account Number: [id number] Date of Birth/Sex: [birth year] (87 y.o. M) Treating RN: Cornell Barman Primary Care Provider: Fulton Reek Other Clinician: Referring Provider: Fulton Reek Treating Provider/Extender: Tito Dine in Treatment: 3 Verbal / Phone Orders: No Diagnosis Coding Edema Control - Lymphedema / Segmental Compressive Device / Other o Patient to wear own Velcro compression garment. Remove compression stockings every night before going to bed and put on every morning when getting up. - Farrow wraps, apply lotion every night before bed. o Elevate, Exercise Daily and Avoid Standing for Long Periods of Time. o Elevate legs to the level of the heart and pump ankles as often as possible o Elevate leg(s) parallel to the floor when sitting. o Compression Pump: Use compression pump on left lower extremity for 60 minutes, twice daily. o Compression Pump: Use compression pump on right lower extremity for 60 minutes, twice daily. Electronic Signature(s) Signed: 10/08/2020 4:14:58 PM By: Linton Ham MD Signed: 10/08/2020 4:36:28 PM By: Gretta Cool, BSN, RN, CWS, Kim RN, BSN Entered By: Gretta Cool, BSN, RN, CWS, Kim on 10/08/2020 14:52:13 [name]  ([id number]) -------------------------------------------------------------------------------- Problem List Details Patient Name: [name] Date of Service: 10/08/2020 2:30 PM Medical Record Number: [id number] Patient Account Number: [id number] Date of Birth/Sex: 1930/05/06 (87 y.o. 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 09/22/2020 No Yes I89.0 Lymphedema, not elsewhere classified 09/15/2020 No Yes I87.2 Venous insufficiency (chronic) (peripheral) 09/15/2020 No Yes Inactive Problems ICD-10 Code Description Active Date Inactive Date I10 Essential (primary) hypertension 09/15/2020 09/15/2020 I25.10 Atherosclerotic heart disease of native coronary artery without  angina pectoris 09/15/2020 09/15/2020 L84 Corns and callosities 09/15/2020 09/15/2020 Resolved Problems Electronic Signature(s) Signed: 10/08/2020 4:14:58 PM By: Linton Ham MD Entered By: Linton Ham on 10/08/2020 15:04:39 [name] ([id number]) -------------------------------------------------------------------------------- Progress Note Details Patient Name: [name] Date of Service: 10/08/2020 2:30 PM Medical Record Number: [id number] Patient Account Number: [id number] Date of Birth/Sex: 1930/09/18 (87 y.o. M) Treating RN: Cornell Barman Primary Care Provider: Fulton Reek Other Clinician: Referring Provider: Fulton Reek Treating Provider/Extender: Tito Dine in Treatment: 3 Subjective History of Present Illness (HPI) ADMISSION 05/17/2018 Mr. Burgo is an 87 year old man who is been treated at the lymphedema clinic for a long period with lymphedema wraps for bilateral lower extremity  lymphedema. About 6 months ago they noted small open areas on his left lateral calf just above the ankle. These were open. They are apparently putting some form of ointment on this. They have been referred here for our review of this. The patient has a long history of lymphedema and venous stasis. It says in his records that he has recurrent cellulitis although his wife denies this but she states that the lymphedema may have started with cellulitis several years ago. Also in his record there is a history of bladder cancer which they seem to know very little about however he did not have any radiation to the pelvis or anything that could have contributed to lymphedema that they are aware of. There is no prior wound history. The patient has 2 small punched out areas on the left lateral calf that have adherent debris on the surface. Past medical history; lymphedema, hypertension, cellulitis, hearing loss, morbid obesity, osteoarthritis, venous stasis, bladder CA, diverticulosis. ABIs in our clinic were 0.36 on the right and 0.57 on the left 05/24/2018; corrected age on this patient is 74 versus what I said last week. He has been for his arterial studies which predictably are not very good. On the right his ABI is 0.65. Monophasic waveforms noted at the right ankle including the posterior tibial and dorsalis pedis. He has triphasic waveforms of the common femoral and profunda femoris triphasic proximal SFA with monophasic distal FSA and popliteal artery. Monophasic tibial waveforms. On the left his ABI is 0.58 monophasic waveforms at the left ankle. Duplex of the left lower extremity demonstrated atherosclerotic change with monophasic waveforms throughout the left lower extremity. There was some concern about left iliac disease and potentially femoral-popliteal and tibial disease. The patient does not describe claudication although according to his wife he over emphasizes his activity. Patient states he is  limited by pain in both knees His wounds are on the left lateral calf. 2 small areas with necrotic debris. We have been using silver collagen and light Ace wraps 05/31/2018; wounds on the left lateral calf in the setting of very significant lymphedema and probably PAD. We have been using silver collagen. The base of the small wound looks reasonably improved. I sent him to see Dr. Fletcher Anon however I see now he has an appointment with Dr. Alvester Chou on February 12 2/5; left lateral calf wound looks the same. His wife is doing a good job with her lymphedema wraps and maintaining that the edema. He most likely has PAD and is due to see Dr. Gwenlyn Found of infectious disease on February 12 2/19; left lateral calf wounds look about the same. This looks like wound secondary to chronic venous insufficiency with lymphedema however the patient has very poor arterial studies. We referred him to Dr. Gwenlyn Found. Dr. Gwenlyn Found did  not feel he needed to do anything from an arterial point of view. He did not address the question about the aggressiveness of compression. After some thoughts about this I elected to go ahead and put him in 3 layer compression which after all would be less compression then the lymphedema clinic was putting on this. I am hopeful that this should be enough to get some closure of the small wounds 2/26; left lateral calf wound letter this week. He tolerated the 3 layer compression well furthermore he is sleeping in a hospital bed which helps keep his legs up at night. The wound looks better measuring smaller especially in width 3/3; left lateral calf wound about the same size. The 3 layer compression has really helped get the edema down in his leg however. Using silver collagen 3/11; left lateral calf wound better looking wound surface but about the same size. We have been using 3 layer compression which is really helped get the edema down in the left leg. Even after Dr. Kennon Holter assessment I am reluctant to go to  4 layer compression. He is tolerating 3 layer compression well. We have been using silver collagen 3/18-Patient returns for his left lateral calf wound which overall is looking better, slightly increased in size and dimensions and area, he is tolerating the 3 layer compression, has been dressed with silver collagen which we will continue 3/25 left lateral calf wound much better looking. Size is smaller. He has been using silver collagen 4/1; gradually getting smaller in size. Using silver collagen with 3 layer compression. I think the patient has some degree of PAD. He saw Dr. Gwenlyn Found in consultation, he did not specifically comment on whether he could tolerate 4 layer compression 4/8; the wound is slightly smaller in depth. We are using 3 layer compression with silver collagen. I had him seen for arterial insufficiency however his ABI was 0.57 in the left leg. Dr. Gwenlyn Found really did not seem to middle on the degree of compression he could tolerate 4/15; the wound is slightly smaller and slightly less deep. We have been using 3 layer compression with silver collagen. There is not an option for 4-layer compression here. 4/22; the patient is making decent progress on his wound on the left lateral calf however he arrives in with a superficial wound on the right anterior tibial area. Were not really sure how this happened. He had been using a stocking on the right leg. He has been using silver collagen on the wounds Amazing to me he is he appears to have compression pumps at home. I am not really sure I knew this before. In any case he has not been using them 4/29; left lateral calf wound continues to get gradually smaller. This looks like it is on its way to closure. The area on the right anterior tibial area is about the same in terms of size superficial without any depth. This was probably wrap injury He tells me that he has been using his compression pumps once a day for 1 hour without any pain 5/6;  not as good today. Left lateral calf wound is actually larger and he does not have quite as good edema control. The area on the right anterior tibia is about the same still superficial. He states he is using his compression pumps once a day. We are using 3 layer compression on the left and Curlex Coban on the right 5/13; the area on the right is very close to being healed. The  area on the left still has some depth but has a healthy wound surface. He has excellent edema control bilaterally. Using [facility] DEONTAE, Latisia Hilaire ([id number]) 5/20-Patient returns in 1 week after being in 3 layer compression on the left, the left lateral calf ulcer is stable with some slough, the right anterior tibial ulcer is healed we have been using Kerlix Coban on that leg 5/27; Hydrofera Blue and 3 layer compression. Still some slough in the wound area and some nonviable edges around the wound. 6/3; this patient has lymphedema and chronic venous insufficiency. He had difficult to close wound on his left lateral calf. This is closed today. 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 Finally he has known PAD but he is managed to tolerate the compression we reporting on him and also using his compression pumps that he already had at home twice a day 6/10; we discharge this patient last week having finally close the small area in the left lateral calf. Apparently by Friday of last week this had opened and was draining. He is therefore back in clinic. His wife who is present also was concerned about an area on the right anterior tibia."o 6/17; the patient does not have an open area on the right leg. On the left he has a small circular area anteriorly and the same area laterally that he had last time. However both of these appear to be improved he has his Farrow wrap 30/40. It turns out that he is only using his external compression pumps once a day. I tried to get him up to twice a  day today 6/24; the patient comes in with his external compression garment on the right leg/Farrow wrap. As far as we know no open wounds here. He has 2 areas that are smaller one on the left anterior pretibial and one on the left lateral calf which is the site of his initial wounds. I have successfully caught him into his external compression pumps twice a day 7/1; still using his external compression garment on the right leg. As far as we know there are no open wounds here. The area on the left lateral calf which was the site of his initial wound has closed over. The area on the left anterior pretibial that we identified last week is still open. 7/8; as far as we know he has no open wounds on the right leg. He has his external compression garment on here. The original wound on the left lateral calf has closed over. He developed an area on the left anterior pretibial 2 weeks ago and seems to have 2 smaller areas on the proximal and distal lateral calf. We do not have his good edema control this week as I am used to seeing 7/15; the patient has still a linear area on the left anterior tibia. The area on the left lateral leg looked somewhat inflamed today but no cellulitis. He did have what looked to be a small pustule that I cultured for CandS although I did not give him empiric antibiotics. 7/22; small area on the left anterior tibia. The area on the left lateral leg still looks inflamed. The pustule that I cultured last week grew Enterobacter and Pseudomonas. I prescribed Cipro they still have not picked that up. In any case the area actually looks fairly satisfactory. He has decent edema control using his compression pumps twice a day 7/29; the area on the left lateral leg looks a lot  better than last week. Nothing is open here. He has an area just medial to the mid anterior tibia that is still open that is only open wound today. 8/5-Patient returns at 1 week for the left leg ulcer, which is  actually looking better, he is also complaining of left plantar foot pain below the great toe. He does have a hematoma here 8/12-Patient returns at 1 week, the left leg open area looks about the same if not slightly better, the left plantar hematoma under the great toe is about the same, patient apparently has not been resting his foot on anything for too long period of time and does not wear the [employer]ken[employer] shoes unless he walks outdoors which he does not do often 8/19; small wound on the left anterior lower leg this is just about closed there is nothing on the left lateral leg. He is using his compression pumps He arrives in clinic today with a black eschar over the right first plantar metatarsal head. This was apparently a bruise that was identified by her nurses last week. He wears crocs on his feet. He also may rub these on a footboard at home 8/26; there is nothing open on the left lateral lower leg he has a small area just medial to the tibia. He is going to see his podiatrist Dr. Milinda Pointer about the area on the left plantar first metatarsal head. He is using his external compression pumps. He has a juxta lite on the right leg 9/2; there is nothing open on either side of her left lower leg. He went to see podiatry about the area on the left first metatarsal head. This was apparently debrided. He was given mupirocin and oral antibiotics. I do not believe he is offloading this at all. Readmission: 04/02/2019 upon evaluation today patient actually appears for reevaluation here in our clinic secondary to issues that he is having with his left lower extremity. He has been tolerating the dressing changes without complication. Fortunately there is no signs of active infection at this time. No fevers, chills, nausea, vomiting, or diarrhea. He has been in the hospital since we last saw him and then subsequently transferred back to [facility]. He tells me that they have been changing his dressing once  a day and that he has had a significant amount of pain secondary to undergoing these dressing changes. Fortunately there is no evidence of active infection at this point which is good news. No fevers, chills, nausea, vomiting, or diarrhea. With that being said he wonders if we can go back to doing what we were doing before which was when he actually had the dressing changes performed once a week here in the clinic he tells me that he did very well with that. At that point he tolerated a 3 layer compression wrap which was approved by vascular, Dr. Tyrell Antonio office, to be used previous although 4-layer compression wrap is probably too much for him. 04/16/2019 on evaluation today patient appears to be doing decently well with regard to his lower extremity although he has had a lot of drainage from his leg. A lot of this I think is just secondary to the lymphedema with that being said he does have some odor as well which may again just be due to more of the excessive moisture although I think that he may also have something that is a result of bacteria again he has somewhat of a Pseudomonas-like smell to the drainage and fortunately though he is not  showing any signs of severe cellulitis he is still experiencing quite a bit of drainage and the discoloration is somewhat of a yellow/green in color which is consistent with Pseudomonas. If he can tolerated I think he may do well with Cipro. 04/24/2019 on evaluation today patient appears to be doing well with regard to his wound in the gluteal region which is healing quite well. That is excellent news. With that being said he is also doing better with regard to his left lower extremity overall I feel like things are definitely showing signs of improvement. 12/28-Patient returns with regards to his gluteal wound that is improving, Left lower leg wounds with significant edema in the lower legs that appear to be persistent. Patient is in compression and has  lymphedema pumps at home but apparently he does not use consistently 05/07/2019 Upon inspection patient's wound bed actually showed signs on evaluation today patient's wound currently showed signs of improvement. There is still a lot of maceration but it does look like that there is been some of this maceration occurring as a result of drainage in particular and some as a result of the fact that they have been putting bag balm on the wound area as well as the leg in general. I think this is applying too much moisture and not doing him any good. I discussed with the patient and his wife today that I think that practice needs to be adjusted I would recommend using a different type of lotion in order to help with moisture around the wound but I would not recommend anything directly on the wound itself. 05/14/2019 on evaluation today patient actually appears to be doing much better in regard to his lower extremity. They have not been using any of the bag balm on the area I think this has made a tremendous improvement overall in his wounds. He has a lot of new skin growth and though there still are a lot of open wounds scattered throughout the circumferential lower leg this overall seems to be doing much better to me. 05/21/2019 upon evaluation today patient appears to be doing well in regard to his lower extremity although he is having unfortunately some blue- green drainage noted at this point. The does have me concerned for a reinitiation of the Pseudomonas infection that we previously treated in [place], [place] ([id number]) December. The last time I gave him a prescription was April 16, 2019. That was a little over a month ago. Nonetheless he does have more green drainage noted today and I think this is indicative of potentially a revving up infection. Obviously we do not want to get to that point. 05/28/2019 on evaluation today patient appears to be doing better with regard to his lower extremity at  this point. Overall I am seeing signs of improvement which is good news. Fortunately there is no evidence of active infection at this time. I do believe that the Cipro has been beneficial for him that was prescribed last week for 14 days he is halfway through that and already appears to be doing much better. This is excellent news. 06/04/2019 upon evaluation today I do believe that the patient is doing better with regard to his lower extremity. I feel like that he has much less in the way of open wounds and drainage and much in provement in regard to the overall weeping and even his swelling. There is no signs of erythema no obvious evidence of active infection all of which is great news.  No fevers, chills, nausea, vomiting, or diarrhea. 06/11/2019 on evaluation today patient appears to be doing better with regard to his lower extremity on the left. He has been tolerating dressing changes without complication. Fortunately there is no signs of active infection at this time. No fevers, chills, nausea, vomiting, or diarrhea. 06/18/2019 upon evaluation today patient appears to be doing slightly worse compared to last week. They did call me on Friday due to the fact that he had green discharge and both his wife as well as his home health nurse were concerned about what they were seeing. Subsequently I did actually send in a prescription for Cipro for 14 days for him and according to his wife this seems to be doing better based on what she is seeing today compared to Friday. This is great news. Home health is still coming out to see him on Wednesdays and Fridays as well. 06/25/2019 upon evaluation today patient appears to be doing somewhat better in regard to his leg ulcer. Fortunately there is no signs of active infection at this time which is good news. With that being said I am very pleased with how things seem to be going and overall I see no major issues here I think the alginate is doing well along with the  XtraSorb. With regard to the patient's plantar foot he does have somewhat of a blood blister that appears to be resolving at this point his wife states that she feels like the issue is that he is not dropping his shoe in order to keep it from sliding when he walks. Subsequently this is leading to increases in friction which of course is leading to more issues with getting this area to heal the patient is struggling in that regard. 07/02/2019 upon evaluation today patient appears to be making some progress in regard to his lower extremities. With that being said I really feel like the alginate may be getting too moist and then sitting on the skin causing some complications here for [place]. I discussed with the patient and his wife today as well the possibility of going ahead and discontinuing the alginate just utilizing the Xtrasorb to try to keep the edema under better control while at the same time preventing any dressings from staying too moist right on the surface of the wounds. Obviously I think this can do a good job in helping to dry things out but I would recommend that we give this a shot. 07/09/2019 upon evaluation today patient appears to be doing better in regard to some of the edema and weeping in regard to the left lower extremity. Unfortunately he has not been wearing his compression of the right lower extremity and now that is open as well. He needs to obviously be wearing this although for today we will get a half to wrap him due to the fact that this is now open. I explained that he does need to be wearing his compression at all times. 07/16/2019 upon evaluation today patient actually does appear to be making some progress in regard to his lower extremity ulcers at this point. In fact he really does not have any ulcerations as much as he just does have lymphedema breakdown causing weeping. Nonetheless he is drying up quite a bit but overall seems to be doing much better. He has a lot of dry  flaky skin I think we may want to add triamcinolone to the skin to try to help out with cutting back on some of the  irritation here hopefully improve the overall skin quality. He does have Velcro compression wraps once he heals that is good news as well. 07/23/2019 upon evaluation today patient appears to be doing better with regard to his bilateral lower extremities. I think we are getting closer to getting everything sealed up. Again he does not have any significant wounds and his lymphedema has dramatically improved. 07/30/2019 upon evaluation today patient appears to be doing excellent in regard to his bilateral lower extremities. Fortunately there is no signs of active infection at this time. Overall the patient seems to be in fact completely sealed up as far as any areas of weeping or concern which also has me thrilled based on what we are seeing currently. Overall I think that he is doing extremely well. 09/03/2019 upon evaluation today patient appears to be doing somewhat poorly in regard to his left lower extremity with regard to open wounds. Fortunately there is nothing that appears to be draining too terribly although he has about 3 different areas that are open as far as wounds are concerned all 3 seem to be fairly superficial. He was discharged from our clinic on 07/30/2019. Subsequently he reopened in the interim apparently at home health never completely discharged home from being seen. They in fact have still continue to wrap his left leg up until now. 09/10/2019 upon evaluation today patient actually appears to be doing better with regard to his lower extremities. Fortunately there is no signs of active infection at this time. No fevers, chills, nausea, vomiting, or diarrhea. Overall been fairly pleased with how things seem to be progressing at this point. The patient is likewise happy that his legs look better. He tells me he has been elevating more. With that being said he does not  really commit to exactly how much more and his wife tells me he still spends a lot of time with his feet on the ground. 5/19; have not seen this patient in quite some time. He had wounds on his left lower extremity. These are all healed today. He has juxta lites for both legs. He has 1 on the right leg it looks as though he has been putting this on well. His wife emphasizes that he lotions his own legs and puts on his own stocking. He also has external compression pumps that he uses twice a day Readmission: 09/15/2020 upon evaluation today patient appears to be doing somewhat poorly in regard to his bilateral lower extremities. He seems to likely have cellulitis. He is having weeping and drainage due to lymphedema but I do not see any evidence of actual systemic infection at this point which is great news. With that being said I do feel like that this is likely continue to be addressed with some oral antibiotics at minimum. If he gets any worse he is probably need to go to the hospital. The patient's ABI when last checked by Dr. Alvester Chou was on the right 0.65 and the left 0.58 but it was felt that he could tolerate compression and subsequently he is always tolerated the 3 layer compression wrap. Again he does have a history of lymphedema, chronic venous insufficiency, hypertension, coronary artery disease, and callus buildup on his feet. 5/23 the patient cultured predominantly staph aureus last time however a few Pseudomonas. He has been on Bactrim which would probably not cover the Pseudomonas however that cellulitis in his right leg is completely resolved. HOWEVER he has an open area in the right posterior calf. No open  wound on the left he has not been using his compression pumps because he was not sure he had 2 with the infection present 6/1; everything is closed on both legs including area right posterior. He has a lot of desquamating surface epithelium on the posterior part of the calf I think in  the setting of chronic stasis dermatitis. Everything is also closed on the left. They have Farrow wrap external stockings although they did not bring these in today. 6/8; everything was closed again this week. He has his bilateral Farrow wrap 4000. We put these on for the first time today. He has severe chronic venous insufficiency and secondary lymphedema. He has compression pumps at home and he is faithfully using these twice a day JAYVEN, NAILL. ([id number]) Objective Constitutional Sitting or standing Blood Pressure is within target range for patient.. Pulse regular and within target range for patient.Marland Kitchen Respirations regular, non- labored and within target range.. Temperature is normal and within the target range for the patient.Marland Kitchen appears in no distress. Vitals Time Taken: 2:39 PM, Height: 67 in, Weight: 250 lbs, BMI: 39.2, Temperature: 98.7 F, Pulse: 89 bpm, Respiratory Rate: 18 breaths/min, Blood Pressure: 130/69 mmHg. General Notes: Wound exam; there is no open wounds bilaterally. He still has stasis dermatitis and flaking dry skin. Pedal pulses are palpable his edema is well controlled and are compression Assessment Active Problems ICD-10 Non-pressure chronic ulcer of right calf with other specified severity Lymphedema, not elsewhere classified Venous insufficiency (chronic) (peripheral) Plan Edema Control - Lymphedema / Segmental Compressive Device / Other: Patient to wear own Velcro compression garment. Remove compression stockings every night before going to bed and put on every morning when getting up. - Farrow wraps, apply lotion every night before bed. Elevate, Exercise Daily and Avoid Standing for Long Periods of Time. Elevate legs to the level of the heart and pump ankles as often as possible Elevate leg(s) parallel to the floor when sitting. Compression Pump: Use compression pump on left lower extremity for 60 minutes, twice daily. Compression Pump: Use compression pump  on right lower extremity for 60 minutes, twice daily. 1. We showed him how to apply the Farrow wrap 4000 stockings. 2. I am not sure his wife is going to help him with much and I am not sure how much help he requires. 3. I emphasized to him I would like to get the stockings on early in the morning he can pump over the top of these which he is doing twice a day. 4. Also moisturizer which she has in his bathroom [Cetaphil] nightly Electronic Signature(s) Signed: 10/08/2020 4:14:58 PM By: Linton Ham MD Entered By: Linton Ham on 10/08/2020 15:08:46 [name] ([id number]) -------------------------------------------------------------------------------- SuperBill Details Patient Name: [name] Date of Service: 10/08/2020 Medical Record Number: [id number] Patient Account Number: [id number] Date of Birth/Sex: Jul 18, 1930 (87 y.o. M) Treating RN: Cornell Barman Primary Care Provider: Fulton Reek Other Clinician: Referring Provider: Fulton Reek Treating Provider/Extender: Tito Dine in Treatment: 3 Diagnosis Coding ICD-10 Codes Code Description [phone] Non-pressure chronic ulcer of right calf with other specified severity I89.0 Lymphedema, not elsewhere classified I87.2 Venous insufficiency (chronic) (peripheral) Facility Procedures CPT4 Code: [id number] Description: [phone] - WOUND CARE VISIT-LEV 2 EST PT Modifier: Quantity: 1 Physician Procedures CPT4 Code: [id number] Description: 95093 - WC PHYS LEVEL 2 - EST PT Modifier: Quantity: 1 CPT4 Code: Description: ICD-10 Diagnosis Description O67.124 Non-pressure chronic ulcer of right calf with other specified severity I89.0 Lymphedema, not elsewhere classified I87.2 Venous  insufficiency (chronic) (peripheral) Modifier: Quantity: Electronic Signature(s) Signed: 10/08/2020 4:14:58 PM By: Linton Ham MD Entered By: Linton Ham on 10/08/2020 15:41:46

## 2020-10-09 NOTE — Progress Notes (Signed)
Steven Meza, Steven Meza ([id number]) Visit Report for 10/08/2020 Arrival Information Details Patient Name: DEMORRIS, Meza Date of Service: 10/08/2020 2:30 PM Medical Record Number: [id number] Patient Account Number: [id number] Date of Birth/Sex: 11-25-1930 (87 y.o. M) Treating RN: Dolan Amen Primary Care Dyllon Henken: Fulton Reek Other Clinician: Referring Yuvaan Olander: Fulton Reek Treating Nova Schmuhl/Extender: Tito Dine in Treatment: 3 Visit Information History Since Last Visit Has Dressing in Place as Prescribed: Yes Patient Arrived: Walker Pain Present Now: No Arrival Time: 14:39 Accompanied By: wife Transfer Assistance: None Patient Identification Verified: Yes Secondary Verification Process Completed: Yes Patient Requires Transmission-Based Precautions: No Patient Has Alerts: Yes Patient Alerts: ABI right .65 ABI left .58 Electronic Signature(s) Signed: 10/08/2020 4:25:56 PM By: Georges Mouse, Minus Breeding RN Entered By: Georges Mouse, Minus Breeding on 10/08/2020 14:39:43 Steven Meza ([id number]) -------------------------------------------------------------------------------- Clinic Level of Care Assessment Details Patient Name: Steven Meza Date of Service: 10/08/2020 2:30 PM Medical Record Number: [id number] Patient Account Number: [id number] Date of Birth/Sex: 04-09-1931 (87 y.o. M) Treating RN: Cornell Barman Primary Care Chantavia Bazzle: Fulton Reek Other Clinician: Referring Marzelle Rutten: Fulton Reek Treating Dekota Shenk/Extender: Tito Dine in Treatment: 3 Clinic Level of Care Assessment Items TOOL 4 Quantity Score []  - Use when only an EandM is performed on FOLLOW-UP visit 0 ASSESSMENTS - Nursing Assessment / Reassessment X - Reassessment of Co-morbidities (includes updates in patient status) 1 10 X- 1 5 Reassessment of Adherence to Treatment Plan ASSESSMENTS - Wound and Skin Assessment / Reassessment []  - Simple Wound Assessment / Reassessment - one wound  0 []  - 0 Complex Wound Assessment / Reassessment - multiple wounds []  - 0 Dermatologic / Skin Assessment (not related to wound area) ASSESSMENTS - Focused Assessment []  - Circumferential Edema Measurements - multi extremities 0 []  - 0 Nutritional Assessment / Counseling / Intervention []  - 0 Lower Extremity Assessment (monofilament, tuning fork, pulses) []  - 0 Peripheral Arterial Disease Assessment (using hand held doppler) ASSESSMENTS - Ostomy and/or Continence Assessment and Care []  - Incontinence Assessment and Management 0 []  - 0 Ostomy Care Assessment and Management (repouching, etc.) PROCESS - Coordination of Care X - Simple Patient / Family Education for ongoing care 1 15 []  - 0 Complex (extensive) Patient / Family Education for ongoing care []  - 0 Staff obtains Programmer, systems, Records, Test Results / Process Orders []  - 0 Staff telephones HHA, Nursing Homes / Clarify orders / etc []  - 0 Routine Transfer to another Facility (non-emergent condition) []  - 0 Routine Hospital Admission (non-emergent condition) []  - 0 New Admissions / Biomedical engineer / Ordering NPWT, Apligraf, etc. []  - 0 Emergency Hospital Admission (emergent condition) X- 1 10 Simple Discharge Coordination []  - 0 Complex (extensive) Discharge Coordination PROCESS - Special Needs []  - Pediatric / Minor Patient Management 0 []  - 0 Isolation Patient Management []  - 0 Hearing / Language / Visual special needs []  - 0 Assessment of Community assistance (transportation, D/C planning, etc.) []  - 0 Additional assistance / Altered mentation []  - 0 Support Surface(s) Assessment (bed, cushion, seat, etc.) INTERVENTIONS - Wound Cleansing / Measurement Steven Meza ([id number]) X- 1 5 Simple Wound Cleansing - one wound []  - 0 Complex Wound Cleansing - multiple wounds X- 1 5 Wound Imaging (photographs - any number of wounds) []  - 0 Wound Tracing (instead of photographs) X- 1 5 Simple Wound  Measurement - one wound []  - 0 Complex Wound Measurement - multiple wounds INTERVENTIONS - Wound Dressings []  - Small Wound Dressing one or multiple wounds 0 []  -  0 Medium Wound Dressing one or multiple wounds []  - 0 Large Wound Dressing one or multiple wounds []  - 0 Application of Medications - topical []  - 0 Application of Medications - injection INTERVENTIONS - Miscellaneous []  - External ear exam 0 []  - 0 Specimen Collection (cultures, biopsies, blood, body fluids, etc.) []  - 0 Specimen(s) / Culture(s) sent or taken to Lab for analysis []  - 0 Patient Transfer (multiple staff / Harrel Lemon Lift / Similar devices) []  - 0 Simple Staple / Suture removal (25 or less) []  - 0 Complex Staple / Suture removal (26 or more) []  - 0 Hypo / Hyperglycemic Management (close monitor of Blood Glucose) []  - 0 Ankle / Brachial Index (ABI) - do not check if billed separately []  - 0 Vital Signs Has the patient been seen at the hospital within the last three years: Yes Total Score: 55 Level Of Care: New/Established - Level 2 Electronic Signature(s) Signed: 10/08/2020 4:36:28 PM By: Gretta Cool, BSN, RN, CWS, Kim RN, BSN Entered By: Gretta Cool, BSN, RN, CWS, Kim on 10/08/2020 14:53:46 Steven Meza ([id number]) -------------------------------------------------------------------------------- Encounter Discharge Information Details Patient Name: Steven Meza Date of Service: 10/08/2020 2:30 PM Medical Record Number: [id number] Patient Account Number: [id number] Date of Birth/Sex: 1931/03/24 (87 y.o. M) Treating RN: Dolan Amen Primary Care Lysette Lindenbaum: Fulton Reek Other Clinician: Referring Glennis Borger: Fulton Reek Treating Keshana Klemz/Extender: Tito Dine in Treatment: 3 Encounter Discharge Information Items Discharge Condition: Stable Ambulatory Status: Walker Discharge Destination: Home Transportation: Private Auto Accompanied By: wife Schedule Follow-up Appointment: No Clinical  Summary of Care: Electronic Signature(s) Signed: 10/08/2020 3:21:44 PM By: Georges Mouse, Minus Breeding RN Entered By: Georges Mouse, Minus Breeding on 10/08/2020 15:21:43 Steven Meza ([id number]) -------------------------------------------------------------------------------- Lower Extremity Assessment Details Patient Name: Steven Meza Date of Service: 10/08/2020 2:30 PM Medical Record Number: [id number] Patient Account Number: [id number] Date of Birth/Sex: Sep 21, 1930 (87 y.o. M) Treating RN: Dolan Amen Primary Care Tarae Wooden: Fulton Reek Other Clinician: Referring Layia Walla: Fulton Reek Treating Micaela Stith/Extender: Tito Dine in Treatment: 3 Edema Assessment Assessed: [Left: Yes] [Right: Yes] Edema: [Left: Yes] [Right: Yes] Calf Left: Right: Point of Measurement: 35 cm From Medial Instep 42.2 cm 40 cm Ankle Left: Right: Point of Measurement: 10 cm From Medial Instep 29.5 cm 28 cm Vascular Assessment Pulses: Dorsalis Pedis Palpable: [Left:Yes] [Right:Yes] Electronic Signature(s) Signed: 10/08/2020 4:25:56 PM By: Georges Mouse, Minus Breeding RN Entered By: Georges Mouse, Minus Breeding on 10/08/2020 14:45:46 Steven Meza ([id number]) -------------------------------------------------------------------------------- Multi Wound Chart Details Patient Name: Steven Meza Date of Service: 10/08/2020 2:30 PM Medical Record Number: [id number] Patient Account Number: [id number] Date of Birth/Sex: Mar 10, 1931 (87 y.o. M) Treating RN: Cornell Barman Primary Care Graclynn Vanantwerp: Fulton Reek Other Clinician: Referring Etana Beets: Fulton Reek Treating Tri Chittick/Extender: Tito Dine in Treatment: 3 Vital Signs Height(in): 67 Pulse(bpm): 89 Weight(lbs): 250 Blood Pressure(mmHg): 130/69 Body Mass Index(BMI): 39 Temperature(F): 98.7 Respiratory Rate(breaths/min): 18 Wound Assessments Treatment Notes Electronic Signature(s) Signed: 10/08/2020 4:36:28 PM By: Gretta Cool, BSN, RN,  CWS, Kim RN, BSN Entered By: Gretta Cool, BSN, RN, CWS, Kim on 10/08/2020 14:49:47 Steven Meza ([id number]) -------------------------------------------------------------------------------- Multi-Disciplinary Care Plan Details Patient Name: Steven Meza Date of Service: 10/08/2020 2:30 PM Medical Record Number: [id number] Patient Account Number: [id number] Date of Birth/Sex: Oct 17, 1930 (87 y.o. M) Treating RN: Cornell Barman Primary Care Aerial Dilley: Fulton Reek Other Clinician: Referring Xiao Graul: Fulton Reek Treating Sidi Dzikowski/Extender: Tito Dine in Treatment: 3 Active Inactive Electronic Signature(s) Signed: 10/08/2020 4:36:28 PM By: Gretta Cool, BSN, RN, CWS, Kim RN, BSN Entered  By: Gretta Cool, BSN, RN, CWS, Kim on 10/08/2020 14:49:38 Steven Meza ([id number]) -------------------------------------------------------------------------------- Pain Assessment Details Patient Name: Steven Meza Date of Service: 10/08/2020 2:30 PM Medical Record Number: [id number] Patient Account Number: [id number] Date of Birth/Sex: 1931/04/27 (87 y.o. M) Treating RN: Dolan Amen Primary Care Geoff Dacanay: Fulton Reek Other Clinician: Referring Channelle Bottger: Fulton Reek Treating Denisa Enterline/Extender: Tito Dine in Treatment: 3 Active Problems Location of Pain Severity and Description of Pain Patient Has Paino No Site Locations Rate the pain. Current Pain Level: 0 Pain Management and Medication Current Pain Management: Electronic Signature(s) Signed: 10/08/2020 4:25:56 PM By: Georges Mouse, Minus Breeding RN Entered By: Georges Mouse, Minus Breeding on 10/08/2020 14:40:14 Steven Meza ([id number]) -------------------------------------------------------------------------------- Patient/Caregiver Education Details Patient Name: Steven Meza Date of Service: 10/08/2020 2:30 PM Medical Record Number: [id number] Patient Account Number: [id number] Date of Birth/Gender: 27-Jan-1931 (87 y.o.  M) Treating RN: Cornell Barman Primary Care Physician: Fulton Reek Other Clinician: Referring Physician: Fulton Reek Treating Physician/Extender: Tito Dine in Treatment: 3 Education Assessment Education Provided To: Patient Education Topics Provided Wound/Skin Impairment: Handouts: Caring for Your Ulcer Methods: Demonstration, Explain/Verbal Responses: State content correctly Electronic Signature(s) Signed: 10/08/2020 4:36:28 PM By: Gretta Cool, BSN, RN, CWS, Kim RN, BSN Entered By: Gretta Cool, BSN, RN, CWS, Kim on 10/08/2020 14:54:08 Steven Meza ([id number]) -------------------------------------------------------------------------------- Vitals Details Patient Name: Steven Meza Date of Service: 10/08/2020 2:30 PM Medical Record Number: [id number] Patient Account Number: [id number] Date of Birth/Sex: [birth year] (87 y.o. M) Treating RN: Dolan Amen Primary Care Zakara Parkey: Fulton Reek Other Clinician: Referring Xan Sparkman: Fulton Reek Treating Nailea Whitehorn/Extender: Tito Dine in Treatment: 3 Vital Signs Time Taken: 14:39 Temperature (F): 98.7 Height (in): 67 Pulse (bpm): 89 Weight (lbs): 250 Respiratory Rate (breaths/min): 18 Body Mass Index (BMI): 39.2 Blood Pressure (mmHg): 130/69 Reference Range: 80 - 120 mg / dl Electronic Signature(s) Signed: 10/08/2020 4:25:56 PM By: Georges Mouse, Minus Breeding RN Entered By: Georges Mouse, Minus Breeding on 10/08/2020 14:40:00

## 2020-10-14 ENCOUNTER — Ambulatory Visit: Payer: Medicare Other | Admitting: Physician Assistant

## 2020-11-11 ENCOUNTER — Telehealth: Payer: Self-pay | Admitting: Podiatry

## 2020-11-11 NOTE — Telephone Encounter (Signed)
Patients daughter called and stated that she wanted Dr. Milinda Pointer to know her dad passed away

## 2020-11-12 ENCOUNTER — Other Ambulatory Visit: Payer: Medicare Other

## 2020-12-01 DEATH — deceased

## 2020-12-15 ENCOUNTER — Ambulatory Visit: Payer: Medicare Other | Admitting: Podiatry
